# Patient Record
Sex: Male | Born: 1935 | Race: White | Hispanic: No | Marital: Married | Smoking: Former smoker
Health system: Southern US, Community
[De-identification: ages and names within clinical notes are randomized; demographics above are authoritative.]

## PROBLEM LIST (undated history)

## (undated) DIAGNOSIS — I4891 Unspecified atrial fibrillation: Secondary | ICD-10-CM

## (undated) DIAGNOSIS — Z9289 Personal history of other medical treatment: Secondary | ICD-10-CM

## (undated) DIAGNOSIS — I701 Atherosclerosis of renal artery: Secondary | ICD-10-CM

## (undated) DIAGNOSIS — I739 Peripheral vascular disease, unspecified: Secondary | ICD-10-CM

## (undated) DIAGNOSIS — I509 Heart failure, unspecified: Secondary | ICD-10-CM

## (undated) DIAGNOSIS — F329 Major depressive disorder, single episode, unspecified: Secondary | ICD-10-CM

## (undated) DIAGNOSIS — Z8711 Personal history of peptic ulcer disease: Secondary | ICD-10-CM

## (undated) DIAGNOSIS — I350 Nonrheumatic aortic (valve) stenosis: Secondary | ICD-10-CM

## (undated) DIAGNOSIS — R000 Tachycardia, unspecified: Secondary | ICD-10-CM

## (undated) DIAGNOSIS — I679 Cerebrovascular disease, unspecified: Secondary | ICD-10-CM

## (undated) DIAGNOSIS — I219 Acute myocardial infarction, unspecified: Secondary | ICD-10-CM

## (undated) DIAGNOSIS — G4730 Sleep apnea, unspecified: Secondary | ICD-10-CM

## (undated) DIAGNOSIS — I10 Essential (primary) hypertension: Secondary | ICD-10-CM

## (undated) DIAGNOSIS — I214 Non-ST elevation (NSTEMI) myocardial infarction: Secondary | ICD-10-CM

## (undated) DIAGNOSIS — M84376A Stress fracture, unspecified foot, initial encounter for fracture: Secondary | ICD-10-CM

## (undated) DIAGNOSIS — R42 Dizziness and giddiness: Secondary | ICD-10-CM

## (undated) DIAGNOSIS — K219 Gastro-esophageal reflux disease without esophagitis: Secondary | ICD-10-CM

## (undated) DIAGNOSIS — Z7901 Long term (current) use of anticoagulants: Secondary | ICD-10-CM

## (undated) DIAGNOSIS — I482 Chronic atrial fibrillation, unspecified: Secondary | ICD-10-CM

## (undated) DIAGNOSIS — H8110 Benign paroxysmal vertigo, unspecified ear: Secondary | ICD-10-CM

## (undated) DIAGNOSIS — Z9889 Other specified postprocedural states: Secondary | ICD-10-CM

## (undated) DIAGNOSIS — M109 Gout, unspecified: Secondary | ICD-10-CM

## (undated) DIAGNOSIS — J189 Pneumonia, unspecified organism: Secondary | ICD-10-CM

## (undated) DIAGNOSIS — D649 Anemia, unspecified: Secondary | ICD-10-CM

## (undated) DIAGNOSIS — F419 Anxiety disorder, unspecified: Secondary | ICD-10-CM

## (undated) DIAGNOSIS — E119 Type 2 diabetes mellitus without complications: Secondary | ICD-10-CM

## (undated) DIAGNOSIS — I517 Cardiomegaly: Secondary | ICD-10-CM

## (undated) DIAGNOSIS — L0390 Cellulitis, unspecified: Secondary | ICD-10-CM

## (undated) DIAGNOSIS — J449 Chronic obstructive pulmonary disease, unspecified: Secondary | ICD-10-CM

## (undated) DIAGNOSIS — G4733 Obstructive sleep apnea (adult) (pediatric): Secondary | ICD-10-CM

## (undated) DIAGNOSIS — R079 Chest pain, unspecified: Secondary | ICD-10-CM

## (undated) DIAGNOSIS — I6529 Occlusion and stenosis of unspecified carotid artery: Secondary | ICD-10-CM

## (undated) DIAGNOSIS — E785 Hyperlipidemia, unspecified: Secondary | ICD-10-CM

## (undated) DIAGNOSIS — I200 Unstable angina: Secondary | ICD-10-CM

## (undated) DIAGNOSIS — F32A Depression, unspecified: Secondary | ICD-10-CM

## (undated) DIAGNOSIS — I2510 Atherosclerotic heart disease of native coronary artery without angina pectoris: Secondary | ICD-10-CM

## (undated) DIAGNOSIS — L8990 Pressure ulcer of unspecified site, unspecified stage: Secondary | ICD-10-CM

## (undated) HISTORY — DX: Nonrheumatic aortic (valve) stenosis: I35.0

## (undated) HISTORY — DX: Anemia, unspecified: D64.9

## (undated) HISTORY — PX: CAROTID ENDARTERECTOMY: SUR193

## (undated) HISTORY — PX: COLON SURGERY: SHX602

## (undated) HISTORY — DX: Personal history of peptic ulcer disease: Z87.11

## (undated) HISTORY — DX: Atherosclerosis of renal artery: I70.1

## (undated) HISTORY — DX: Heart failure, unspecified: I50.9

## (undated) HISTORY — PX: CARDIAC CATHETERIZATION: SHX172

## (undated) HISTORY — PX: RENAL ARTERY STENT: SHX2321

## (undated) HISTORY — DX: Atherosclerotic heart disease of native coronary artery without angina pectoris: I25.10

## (undated) HISTORY — DX: Hyperlipidemia, unspecified: E78.5

## (undated) HISTORY — DX: Essential (primary) hypertension: I10

## (undated) HISTORY — DX: Unspecified atrial fibrillation: I48.91

## (undated) HISTORY — DX: Occlusion and stenosis of unspecified carotid artery: I65.29

## (undated) HISTORY — DX: Peripheral vascular disease, unspecified: I73.9

---
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
## 1992-06-04 HISTORY — PX: CORONARY ARTERY BYPASS GRAFT: SHX141

## 1992-06-04 HISTORY — PX: OTHER SURGICAL HISTORY: SHX169

## 1997-11-16 ENCOUNTER — Ambulatory Visit (HOSPITAL_COMMUNITY): Admission: RE | Admit: 1997-11-16 | Discharge: 1997-11-16 | Payer: Self-pay | Admitting: Cardiology

## 1998-03-03 ENCOUNTER — Ambulatory Visit (HOSPITAL_COMMUNITY): Admission: RE | Admit: 1998-03-03 | Discharge: 1998-03-03 | Payer: Self-pay | Admitting: Cardiology

## 1998-06-09 ENCOUNTER — Ambulatory Visit (HOSPITAL_COMMUNITY): Admission: RE | Admit: 1998-06-09 | Discharge: 1998-06-09 | Payer: Self-pay | Admitting: Cardiology

## 2001-07-14 ENCOUNTER — Ambulatory Visit (HOSPITAL_BASED_OUTPATIENT_CLINIC_OR_DEPARTMENT_OTHER): Admission: RE | Admit: 2001-07-14 | Discharge: 2001-07-14 | Payer: Self-pay | Admitting: Orthopedic Surgery

## 2003-08-03 HISTORY — PX: KNEE SURGERY: SHX244

## 2003-08-30 ENCOUNTER — Ambulatory Visit (HOSPITAL_BASED_OUTPATIENT_CLINIC_OR_DEPARTMENT_OTHER): Admission: RE | Admit: 2003-08-30 | Discharge: 2003-08-30 | Payer: Self-pay | Admitting: Orthopedic Surgery

## 2004-01-28 ENCOUNTER — Ambulatory Visit (HOSPITAL_COMMUNITY): Admission: RE | Admit: 2004-01-28 | Discharge: 2004-01-28 | Payer: Self-pay | Admitting: Cardiology

## 2004-04-05 ENCOUNTER — Ambulatory Visit: Payer: Self-pay | Admitting: *Deleted

## 2004-05-03 ENCOUNTER — Ambulatory Visit: Payer: Self-pay | Admitting: Cardiology

## 2004-05-31 ENCOUNTER — Ambulatory Visit: Payer: Self-pay | Admitting: Internal Medicine

## 2004-06-28 ENCOUNTER — Ambulatory Visit: Payer: Self-pay | Admitting: Cardiology

## 2004-07-26 ENCOUNTER — Ambulatory Visit: Payer: Self-pay | Admitting: Cardiology

## 2004-07-26 ENCOUNTER — Ambulatory Visit: Payer: Self-pay

## 2004-07-28 ENCOUNTER — Ambulatory Visit: Payer: Self-pay | Admitting: Cardiology

## 2004-07-31 ENCOUNTER — Ambulatory Visit: Payer: Self-pay | Admitting: Cardiology

## 2004-08-23 ENCOUNTER — Ambulatory Visit: Payer: Self-pay | Admitting: Cardiovascular Disease

## 2004-09-20 ENCOUNTER — Ambulatory Visit: Payer: Self-pay | Admitting: Cardiology

## 2004-10-11 ENCOUNTER — Ambulatory Visit: Payer: Self-pay | Admitting: Cardiology

## 2004-11-02 ENCOUNTER — Ambulatory Visit: Payer: Self-pay | Admitting: Internal Medicine

## 2004-12-04 ENCOUNTER — Ambulatory Visit: Payer: Self-pay | Admitting: Cardiology

## 2005-01-01 ENCOUNTER — Ambulatory Visit: Payer: Self-pay | Admitting: Cardiology

## 2005-01-29 ENCOUNTER — Ambulatory Visit: Payer: Self-pay

## 2005-01-29 ENCOUNTER — Ambulatory Visit: Payer: Self-pay | Admitting: Cardiology

## 2005-02-26 ENCOUNTER — Ambulatory Visit: Payer: Self-pay | Admitting: Internal Medicine

## 2005-03-07 ENCOUNTER — Ambulatory Visit: Payer: Self-pay | Admitting: Cardiology

## 2005-03-20 ENCOUNTER — Ambulatory Visit: Payer: Self-pay

## 2005-03-20 ENCOUNTER — Ambulatory Visit: Payer: Self-pay | Admitting: Cardiology

## 2005-03-23 ENCOUNTER — Ambulatory Visit: Payer: Self-pay | Admitting: Cardiology

## 2005-04-18 ENCOUNTER — Ambulatory Visit: Payer: Self-pay | Admitting: Cardiology

## 2005-04-24 ENCOUNTER — Ambulatory Visit: Payer: Self-pay | Admitting: Cardiology

## 2005-05-03 ENCOUNTER — Ambulatory Visit: Payer: Self-pay | Admitting: Cardiology

## 2005-05-09 ENCOUNTER — Ambulatory Visit: Payer: Self-pay | Admitting: Cardiology

## 2005-05-09 ENCOUNTER — Ambulatory Visit (HOSPITAL_COMMUNITY): Admission: RE | Admit: 2005-05-09 | Discharge: 2005-05-10 | Payer: Self-pay | Admitting: Cardiology

## 2005-05-15 ENCOUNTER — Ambulatory Visit: Payer: Self-pay | Admitting: Cardiology

## 2005-05-23 ENCOUNTER — Ambulatory Visit: Payer: Self-pay | Admitting: Internal Medicine

## 2005-06-01 ENCOUNTER — Ambulatory Visit: Payer: Self-pay | Admitting: Cardiology

## 2005-06-15 ENCOUNTER — Ambulatory Visit: Payer: Self-pay | Admitting: Internal Medicine

## 2005-06-20 ENCOUNTER — Ambulatory Visit: Payer: Self-pay | Admitting: Cardiology

## 2005-07-13 ENCOUNTER — Ambulatory Visit: Payer: Self-pay | Admitting: Cardiovascular Disease

## 2005-07-31 ENCOUNTER — Ambulatory Visit: Payer: Self-pay

## 2005-07-31 ENCOUNTER — Encounter: Payer: Self-pay | Admitting: Cardiovascular Disease

## 2005-08-10 ENCOUNTER — Ambulatory Visit: Payer: Self-pay | Admitting: Cardiology

## 2005-09-10 ENCOUNTER — Ambulatory Visit: Payer: Self-pay | Admitting: Internal Medicine

## 2005-10-08 ENCOUNTER — Ambulatory Visit: Payer: Self-pay | Admitting: Cardiology

## 2005-10-22 ENCOUNTER — Ambulatory Visit: Payer: Self-pay | Admitting: Cardiology

## 2005-11-16 ENCOUNTER — Ambulatory Visit: Payer: Self-pay | Admitting: Cardiology

## 2005-12-14 ENCOUNTER — Ambulatory Visit: Payer: Self-pay | Admitting: Cardiology

## 2005-12-19 ENCOUNTER — Ambulatory Visit: Payer: Self-pay

## 2006-01-11 ENCOUNTER — Ambulatory Visit: Payer: Self-pay | Admitting: Cardiology

## 2006-01-17 ENCOUNTER — Ambulatory Visit (HOSPITAL_COMMUNITY): Admission: RE | Admit: 2006-01-17 | Discharge: 2006-01-17 | Payer: Self-pay | Admitting: Cardiology

## 2006-01-17 ENCOUNTER — Ambulatory Visit: Payer: Self-pay | Admitting: Cardiology

## 2006-01-23 ENCOUNTER — Ambulatory Visit: Payer: Self-pay | Admitting: Cardiology

## 2006-02-06 ENCOUNTER — Ambulatory Visit: Payer: Self-pay | Admitting: Cardiology

## 2006-02-06 ENCOUNTER — Ambulatory Visit: Payer: Self-pay

## 2006-02-14 ENCOUNTER — Ambulatory Visit: Payer: Self-pay | Admitting: Cardiology

## 2006-03-06 ENCOUNTER — Ambulatory Visit: Payer: Self-pay | Admitting: Cardiology

## 2006-03-20 ENCOUNTER — Ambulatory Visit: Payer: Self-pay | Admitting: Cardiology

## 2006-04-17 ENCOUNTER — Ambulatory Visit: Payer: Self-pay | Admitting: Cardiology

## 2006-05-08 ENCOUNTER — Ambulatory Visit: Payer: Self-pay

## 2006-05-15 ENCOUNTER — Ambulatory Visit: Payer: Self-pay | Admitting: Internal Medicine

## 2006-06-12 ENCOUNTER — Ambulatory Visit: Payer: Self-pay | Admitting: Internal Medicine

## 2006-07-10 ENCOUNTER — Ambulatory Visit: Payer: Self-pay | Admitting: Cardiology

## 2006-07-10 LAB — CONVERTED CEMR LAB
ALT: 39 units/L (ref 0–40)
AST: 32 units/L (ref 0–37)
Albumin: 3.9 g/dL (ref 3.5–5.2)
Alkaline Phosphatase: 82 units/L (ref 39–117)
BUN: 12 mg/dL (ref 6–23)
Bilirubin, Direct: 0.1 mg/dL (ref 0.0–0.3)
CO2: 33 meq/L — ABNORMAL HIGH (ref 19–32)
Calcium: 9.3 mg/dL (ref 8.4–10.5)
Chloride: 106 meq/L (ref 96–112)
Cholesterol: 141 mg/dL (ref 0–200)
Creatinine, Ser: 1.1 mg/dL (ref 0.4–1.5)
GFR calc Af Amer: 85 mL/min
GFR calc non Af Amer: 70 mL/min
Glucose, Bld: 95 mg/dL (ref 70–99)
HDL: 30.3 mg/dL — ABNORMAL LOW (ref >39.0)
LDL Cholesterol: 75 mg/dL (ref 0–99)
Potassium: 4.6 meq/L (ref 3.5–5.1)
Sodium: 142 meq/L (ref 135–145)
Total Bilirubin: 0.6 mg/dL (ref 0.3–1.2)
Total CHOL/HDL Ratio: 4.7
Total Protein: 6.6 g/dL (ref 6.0–8.3)
Triglycerides: 177 mg/dL — ABNORMAL HIGH (ref 0–149)
VLDL: 35 mg/dL (ref 0–40)

## 2006-08-08 ENCOUNTER — Ambulatory Visit: Payer: Self-pay

## 2006-08-08 ENCOUNTER — Ambulatory Visit: Payer: Self-pay | Admitting: Cardiology

## 2006-09-05 ENCOUNTER — Ambulatory Visit: Payer: Self-pay | Admitting: Cardiology

## 2006-09-09 ENCOUNTER — Ambulatory Visit (HOSPITAL_COMMUNITY): Admission: RE | Admit: 2006-09-09 | Discharge: 2006-09-09 | Payer: Self-pay | Admitting: Orthopedic Surgery

## 2006-10-03 ENCOUNTER — Ambulatory Visit: Payer: Self-pay | Admitting: Cardiovascular Disease

## 2006-10-31 ENCOUNTER — Ambulatory Visit: Payer: Self-pay | Admitting: *Deleted

## 2006-11-29 ENCOUNTER — Ambulatory Visit: Payer: Self-pay | Admitting: Cardiology

## 2006-11-29 ENCOUNTER — Ambulatory Visit: Payer: Self-pay

## 2006-12-20 ENCOUNTER — Ambulatory Visit: Payer: Self-pay | Admitting: Cardiovascular Disease

## 2007-01-07 ENCOUNTER — Ambulatory Visit: Payer: Self-pay | Admitting: Cardiology

## 2007-01-07 LAB — CONVERTED CEMR LAB
BUN: 11 mg/dL (ref 6–23)
CO2: 33 meq/L — ABNORMAL HIGH (ref 19–32)
Calcium: 9.9 mg/dL (ref 8.4–10.5)
Chloride: 107 meq/L (ref 96–112)
Creatinine, Ser: 0.6 mg/dL (ref 0.4–1.5)
GFR calc Af Amer: 170 mL/min
GFR calc non Af Amer: 140 mL/min
Glucose, Bld: 107 mg/dL — ABNORMAL HIGH (ref 70–99)
Potassium: 4.8 meq/L (ref 3.5–5.1)
Sodium: 143 meq/L (ref 135–145)

## 2007-01-17 ENCOUNTER — Ambulatory Visit: Payer: Self-pay | Admitting: Internal Medicine

## 2007-01-17 ENCOUNTER — Encounter: Payer: Self-pay | Admitting: Cardiology

## 2007-01-17 ENCOUNTER — Ambulatory Visit: Payer: Self-pay

## 2007-01-17 LAB — CONVERTED CEMR LAB
ALT: 36 units/L (ref 0–53)
AST: 28 units/L (ref 0–37)
Albumin: 3.9 g/dL (ref 3.5–5.2)
Alkaline Phosphatase: 76 units/L (ref 39–117)
BUN: 12 mg/dL (ref 6–23)
Bilirubin, Direct: 0.1 mg/dL (ref 0.0–0.3)
CO2: 33 meq/L — ABNORMAL HIGH (ref 19–32)
Calcium: 9.1 mg/dL (ref 8.4–10.5)
Chloride: 110 meq/L (ref 96–112)
Cholesterol: 120 mg/dL (ref 0–200)
Creatinine, Ser: 1 mg/dL (ref 0.4–1.5)
GFR calc Af Amer: 94 mL/min
GFR calc non Af Amer: 78 mL/min
Glucose, Bld: 114 mg/dL — ABNORMAL HIGH (ref 70–99)
HDL: 29.3 mg/dL — ABNORMAL LOW (ref >39.0)
LDL Cholesterol: 67 mg/dL (ref 0–99)
Potassium: 4.4 meq/L (ref 3.5–5.1)
Pro B Natriuretic peptide (BNP): 160 pg/mL — ABNORMAL HIGH (ref 0.0–100.0)
Sodium: 149 meq/L — ABNORMAL HIGH (ref 135–145)
Total Bilirubin: 0.8 mg/dL (ref 0.3–1.2)
Total CHOL/HDL Ratio: 4.1
Total Protein: 6.6 g/dL (ref 6.0–8.3)
Triglycerides: 119 mg/dL (ref 0–149)
VLDL: 24 mg/dL (ref 0–40)

## 2007-02-14 ENCOUNTER — Ambulatory Visit: Payer: Self-pay | Admitting: Cardiology

## 2007-02-25 ENCOUNTER — Ambulatory Visit: Payer: Self-pay | Admitting: Cardiology

## 2007-03-17 ENCOUNTER — Ambulatory Visit: Payer: Self-pay | Admitting: Internal Medicine

## 2007-03-17 ENCOUNTER — Ambulatory Visit: Payer: Self-pay

## 2007-03-28 ENCOUNTER — Ambulatory Visit: Payer: Self-pay | Admitting: Cardiovascular Disease

## 2007-04-15 ENCOUNTER — Ambulatory Visit: Payer: Self-pay | Admitting: Cardiovascular Disease

## 2007-04-29 ENCOUNTER — Ambulatory Visit: Payer: Self-pay | Admitting: Internal Medicine

## 2007-05-20 ENCOUNTER — Ambulatory Visit: Payer: Self-pay | Admitting: Internal Medicine

## 2007-06-17 ENCOUNTER — Ambulatory Visit: Payer: Self-pay | Admitting: Cardiology

## 2007-07-10 ENCOUNTER — Ambulatory Visit: Payer: Self-pay | Admitting: Cardiology

## 2007-07-28 ENCOUNTER — Ambulatory Visit: Payer: Self-pay | Admitting: Internal Medicine

## 2007-08-27 ENCOUNTER — Ambulatory Visit: Payer: Self-pay | Admitting: Cardiology

## 2007-08-27 LAB — CONVERTED CEMR LAB
ALT: 36 units/L (ref 0–53)
AST: 27 units/L (ref 0–37)
Albumin: 4.1 g/dL (ref 3.5–5.2)
Alkaline Phosphatase: 64 units/L (ref 39–117)
BUN: 12 mg/dL (ref 6–23)
Bilirubin, Direct: 0.1 mg/dL (ref 0.0–0.3)
CO2: 30 meq/L (ref 19–32)
Calcium: 9.2 mg/dL (ref 8.4–10.5)
Chloride: 105 meq/L (ref 96–112)
Cholesterol: 121 mg/dL (ref 0–200)
Creatinine, Ser: 1 mg/dL (ref 0.4–1.5)
GFR calc Af Amer: 94 mL/min
GFR calc non Af Amer: 78 mL/min
Glucose, Bld: 186 mg/dL — ABNORMAL HIGH (ref 70–99)
HDL: 37.5 mg/dL — ABNORMAL LOW (ref >39.0)
LDL Cholesterol: 68 mg/dL (ref 0–99)
Potassium: 4.1 meq/L (ref 3.5–5.1)
Sodium: 139 meq/L (ref 135–145)
Total Bilirubin: 0.6 mg/dL (ref 0.3–1.2)
Total CHOL/HDL Ratio: 3.2
Total Protein: 6.5 g/dL (ref 6.0–8.3)
Triglycerides: 78 mg/dL (ref 0–149)
VLDL: 16 mg/dL (ref 0–40)

## 2007-09-15 ENCOUNTER — Ambulatory Visit: Payer: Self-pay

## 2007-09-15 ENCOUNTER — Ambulatory Visit: Payer: Self-pay | Admitting: Internal Medicine

## 2007-09-16 ENCOUNTER — Ambulatory Visit: Payer: Self-pay | Admitting: Cardiovascular Disease

## 2007-10-14 ENCOUNTER — Ambulatory Visit: Payer: Self-pay | Admitting: Cardiology

## 2007-11-12 ENCOUNTER — Ambulatory Visit: Payer: Self-pay | Admitting: Cardiology

## 2007-12-10 ENCOUNTER — Ambulatory Visit: Payer: Self-pay | Admitting: Cardiovascular Disease

## 2008-01-07 ENCOUNTER — Ambulatory Visit: Payer: Self-pay | Admitting: Internal Medicine

## 2008-02-04 ENCOUNTER — Ambulatory Visit: Payer: Self-pay | Admitting: Cardiology

## 2008-03-03 ENCOUNTER — Ambulatory Visit: Payer: Self-pay | Admitting: Cardiology

## 2008-03-03 ENCOUNTER — Ambulatory Visit: Payer: Self-pay

## 2008-03-03 ENCOUNTER — Encounter: Payer: Self-pay | Admitting: Cardiology

## 2008-03-31 ENCOUNTER — Ambulatory Visit: Payer: Self-pay | Admitting: Cardiovascular Disease

## 2008-04-19 ENCOUNTER — Ambulatory Visit: Payer: Self-pay

## 2008-04-19 ENCOUNTER — Ambulatory Visit: Payer: Self-pay | Admitting: Cardiology

## 2008-04-19 ENCOUNTER — Ambulatory Visit: Payer: Self-pay | Admitting: Cardiovascular Disease

## 2008-04-19 LAB — CONVERTED CEMR LAB
ALT: 45 units/L (ref 0–53)
AST: 41 units/L — ABNORMAL HIGH (ref 0–37)
Albumin: 3.9 g/dL (ref 3.5–5.2)
Alkaline Phosphatase: 74 units/L (ref 39–117)
BUN: 14 mg/dL (ref 6–23)
Bilirubin, Direct: 0.1 mg/dL (ref 0.0–0.3)
CO2: 32 meq/L (ref 19–32)
Calcium: 8.9 mg/dL (ref 8.4–10.5)
Chloride: 106 meq/L (ref 96–112)
Cholesterol: 121 mg/dL (ref 0–200)
Creatinine, Ser: 1 mg/dL (ref 0.4–1.5)
GFR calc Af Amer: 94 mL/min
GFR calc non Af Amer: 78 mL/min
Glucose, Bld: 124 mg/dL — ABNORMAL HIGH (ref 70–99)
HDL: 26 mg/dL — ABNORMAL LOW (ref >39.0)
LDL Cholesterol: 70 mg/dL (ref 0–99)
Potassium: 3.9 meq/L (ref 3.5–5.1)
Sodium: 143 meq/L (ref 135–145)
Total Bilirubin: 0.6 mg/dL (ref 0.3–1.2)
Total CHOL/HDL Ratio: 4.7
Total Protein: 6.5 g/dL (ref 6.0–8.3)
Triglycerides: 123 mg/dL (ref 0–149)
VLDL: 25 mg/dL (ref 0–40)

## 2008-04-27 ENCOUNTER — Ambulatory Visit: Payer: Self-pay | Admitting: Cardiovascular Disease

## 2008-04-27 ENCOUNTER — Ambulatory Visit: Payer: Self-pay | Admitting: Cardiology

## 2008-05-25 ENCOUNTER — Ambulatory Visit: Payer: Self-pay | Admitting: Cardiovascular Disease

## 2008-06-02 ENCOUNTER — Ambulatory Visit: Payer: Self-pay | Admitting: Internal Medicine

## 2008-06-30 ENCOUNTER — Ambulatory Visit: Payer: Self-pay | Admitting: Cardiovascular Disease

## 2008-07-21 ENCOUNTER — Ambulatory Visit: Payer: Self-pay | Admitting: Cardiology

## 2008-08-11 ENCOUNTER — Ambulatory Visit: Payer: Self-pay

## 2008-09-09 DIAGNOSIS — I679 Cerebrovascular disease, unspecified: Secondary | ICD-10-CM

## 2008-09-09 DIAGNOSIS — E1169 Type 2 diabetes mellitus with other specified complication: Secondary | ICD-10-CM | POA: Insufficient documentation

## 2008-09-09 DIAGNOSIS — Z8711 Personal history of peptic ulcer disease: Secondary | ICD-10-CM

## 2008-09-09 DIAGNOSIS — I739 Peripheral vascular disease, unspecified: Secondary | ICD-10-CM

## 2008-09-09 DIAGNOSIS — E785 Hyperlipidemia, unspecified: Secondary | ICD-10-CM

## 2008-09-09 DIAGNOSIS — I359 Nonrheumatic aortic valve disorder, unspecified: Secondary | ICD-10-CM | POA: Insufficient documentation

## 2008-09-09 DIAGNOSIS — I6529 Occlusion and stenosis of unspecified carotid artery: Secondary | ICD-10-CM

## 2008-09-09 DIAGNOSIS — D649 Anemia, unspecified: Secondary | ICD-10-CM

## 2008-09-09 DIAGNOSIS — I701 Atherosclerosis of renal artery: Secondary | ICD-10-CM

## 2008-09-09 HISTORY — DX: Cerebrovascular disease, unspecified: I67.9

## 2008-09-09 HISTORY — DX: Occlusion and stenosis of unspecified carotid artery: I65.29

## 2008-09-09 HISTORY — DX: Peripheral vascular disease, unspecified: I73.9

## 2008-09-09 HISTORY — DX: Personal history of peptic ulcer disease: Z87.11

## 2008-09-09 HISTORY — DX: Anemia, unspecified: D64.9

## 2008-09-10 ENCOUNTER — Ambulatory Visit: Payer: Self-pay | Admitting: Internal Medicine

## 2008-09-10 ENCOUNTER — Ambulatory Visit: Payer: Self-pay | Admitting: Cardiology

## 2008-09-10 ENCOUNTER — Encounter: Payer: Self-pay | Admitting: Cardiology

## 2008-10-07 ENCOUNTER — Ambulatory Visit: Payer: Self-pay

## 2008-10-07 ENCOUNTER — Ambulatory Visit: Payer: Self-pay | Admitting: Cardiology

## 2008-10-07 ENCOUNTER — Encounter (INDEPENDENT_AMBULATORY_CARE_PROVIDER_SITE_OTHER): Payer: Self-pay | Admitting: *Deleted

## 2008-10-07 ENCOUNTER — Ambulatory Visit: Payer: Self-pay | Admitting: Internal Medicine

## 2008-11-02 ENCOUNTER — Encounter: Payer: Self-pay | Admitting: *Deleted

## 2008-11-04 ENCOUNTER — Ambulatory Visit: Payer: Self-pay | Admitting: Internal Medicine

## 2008-11-04 ENCOUNTER — Encounter (INDEPENDENT_AMBULATORY_CARE_PROVIDER_SITE_OTHER): Payer: Self-pay | Admitting: Cardiology

## 2008-11-04 LAB — CONVERTED CEMR LAB
POC INR: 2.9
Protime: 20.6

## 2008-12-02 ENCOUNTER — Ambulatory Visit: Payer: Self-pay | Admitting: Internal Medicine

## 2008-12-02 LAB — CONVERTED CEMR LAB
POC INR: 3.3
Prothrombin Time: 21.8 s

## 2008-12-08 ENCOUNTER — Encounter: Payer: Self-pay | Admitting: *Deleted

## 2008-12-30 ENCOUNTER — Ambulatory Visit: Payer: Self-pay | Admitting: Internal Medicine

## 2008-12-30 LAB — CONVERTED CEMR LAB
POC INR: 3.9
Prothrombin Time: 24 s

## 2009-01-13 ENCOUNTER — Ambulatory Visit: Payer: Self-pay | Admitting: Internal Medicine

## 2009-01-13 LAB — CONVERTED CEMR LAB: POC INR: 3.4

## 2009-02-03 ENCOUNTER — Ambulatory Visit: Payer: Self-pay | Admitting: Internal Medicine

## 2009-02-03 LAB — CONVERTED CEMR LAB: POC INR: 2.3

## 2009-03-03 ENCOUNTER — Ambulatory Visit: Payer: Self-pay | Admitting: Cardiology

## 2009-03-03 LAB — CONVERTED CEMR LAB: POC INR: 1.8

## 2009-03-10 ENCOUNTER — Ambulatory Visit: Payer: Self-pay | Admitting: Cardiology

## 2009-03-10 DIAGNOSIS — I10 Essential (primary) hypertension: Secondary | ICD-10-CM

## 2009-03-24 ENCOUNTER — Ambulatory Visit: Payer: Self-pay | Admitting: Cardiovascular Disease

## 2009-03-24 LAB — CONVERTED CEMR LAB: POC INR: 2.6

## 2009-04-08 ENCOUNTER — Encounter: Payer: Self-pay | Admitting: Cardiology

## 2009-04-11 ENCOUNTER — Encounter: Payer: Self-pay | Admitting: Cardiology

## 2009-04-12 ENCOUNTER — Ambulatory Visit: Payer: Self-pay

## 2009-04-21 ENCOUNTER — Ambulatory Visit: Payer: Self-pay | Admitting: Cardiology

## 2009-04-21 ENCOUNTER — Encounter (INDEPENDENT_AMBULATORY_CARE_PROVIDER_SITE_OTHER): Payer: Self-pay | Admitting: *Deleted

## 2009-04-21 ENCOUNTER — Ambulatory Visit: Payer: Self-pay | Admitting: Internal Medicine

## 2009-04-21 LAB — CONVERTED CEMR LAB
ALT: 42 units/L (ref 0–53)
AST: 31 units/L (ref 0–37)
Albumin: 3.9 g/dL (ref 3.5–5.2)
Alkaline Phosphatase: 60 units/L (ref 39–117)
BUN: 16 mg/dL (ref 6–23)
Basophils Absolute: 0.1 10*3/uL (ref 0.0–0.1)
Basophils Relative: 0.7 % (ref 0.0–3.0)
Bilirubin, Direct: 0.2 mg/dL (ref 0.0–0.3)
CO2: 34 meq/L — ABNORMAL HIGH (ref 19–32)
Calcium: 9.1 mg/dL (ref 8.4–10.5)
Chloride: 103 meq/L (ref 96–112)
Cholesterol: 133 mg/dL (ref 0–200)
Creatinine, Ser: 1.1 mg/dL (ref 0.4–1.5)
Eosinophils Absolute: 0.2 10*3/uL (ref 0.0–0.7)
Eosinophils Relative: 1.9 % (ref 0.0–5.0)
GFR calc non Af Amer: 69.27 mL/min (ref >60)
Glucose, Bld: 129 mg/dL — ABNORMAL HIGH (ref 70–99)
HCT: 44.8 % (ref 39.0–52.0)
HDL: 40.8 mg/dL (ref >39.00)
Hemoglobin: 14.8 g/dL (ref 13.0–17.0)
LDL Cholesterol: 60 mg/dL (ref 0–99)
Lymphocytes Relative: 38 % (ref 12.0–46.0)
Lymphs Abs: 3.3 10*3/uL (ref 0.7–4.0)
MCHC: 33 g/dL (ref 30.0–36.0)
MCV: 89.3 fL (ref 78.0–100.0)
Monocytes Absolute: 1 10*3/uL (ref 0.1–1.0)
Monocytes Relative: 11.4 % (ref 3.0–12.0)
Neutro Abs: 4.2 10*3/uL (ref 1.4–7.7)
Neutrophils Relative %: 48 % (ref 43.0–77.0)
POC INR: 3.8
Platelets: 202 10*3/uL (ref 150.0–400.0)
Potassium: 4.4 meq/L (ref 3.5–5.1)
RBC: 5.01 M/uL (ref 4.22–5.81)
RDW: 13.7 % (ref 11.5–14.6)
Sodium: 144 meq/L (ref 135–145)
Total Bilirubin: 0.8 mg/dL (ref 0.3–1.2)
Total CHOL/HDL Ratio: 3
Total Protein: 6.4 g/dL (ref 6.0–8.3)
Triglycerides: 160 mg/dL — ABNORMAL HIGH (ref 0.0–149.0)
VLDL: 32 mg/dL (ref 0.0–40.0)
WBC: 8.8 10*3/uL (ref 4.5–10.5)

## 2009-05-12 ENCOUNTER — Ambulatory Visit: Payer: Self-pay | Admitting: Cardiology

## 2009-05-12 LAB — CONVERTED CEMR LAB: POC INR: 2.5

## 2009-06-10 ENCOUNTER — Ambulatory Visit: Payer: Self-pay | Admitting: Cardiology

## 2009-06-10 LAB — CONVERTED CEMR LAB: POC INR: 2.6

## 2009-07-08 ENCOUNTER — Ambulatory Visit: Payer: Self-pay | Admitting: Cardiovascular Disease

## 2009-07-08 LAB — CONVERTED CEMR LAB: POC INR: 2.1

## 2009-08-04 ENCOUNTER — Ambulatory Visit: Payer: Self-pay | Admitting: Cardiology

## 2009-08-04 LAB — CONVERTED CEMR LAB: POC INR: 2

## 2009-09-01 ENCOUNTER — Ambulatory Visit: Payer: Self-pay | Admitting: Internal Medicine

## 2009-09-01 LAB — CONVERTED CEMR LAB: POC INR: 2.1

## 2009-09-08 ENCOUNTER — Ambulatory Visit: Payer: Self-pay | Admitting: Cardiology

## 2009-09-08 ENCOUNTER — Encounter (INDEPENDENT_AMBULATORY_CARE_PROVIDER_SITE_OTHER): Payer: Self-pay | Admitting: *Deleted

## 2009-09-14 ENCOUNTER — Telehealth (INDEPENDENT_AMBULATORY_CARE_PROVIDER_SITE_OTHER): Payer: Self-pay | Admitting: *Deleted

## 2009-09-28 ENCOUNTER — Ambulatory Visit: Payer: Self-pay | Admitting: Cardiovascular Disease

## 2009-09-28 LAB — CONVERTED CEMR LAB: POC INR: 1.1

## 2009-10-05 ENCOUNTER — Telehealth (INDEPENDENT_AMBULATORY_CARE_PROVIDER_SITE_OTHER): Payer: Self-pay | Admitting: *Deleted

## 2009-10-06 ENCOUNTER — Encounter (INDEPENDENT_AMBULATORY_CARE_PROVIDER_SITE_OTHER): Payer: Self-pay | Admitting: *Deleted

## 2009-10-06 ENCOUNTER — Ambulatory Visit: Payer: Self-pay

## 2009-10-06 ENCOUNTER — Ambulatory Visit: Payer: Self-pay | Admitting: Cardiology

## 2009-10-06 ENCOUNTER — Encounter: Payer: Self-pay | Admitting: Cardiology

## 2009-10-06 ENCOUNTER — Ambulatory Visit (HOSPITAL_COMMUNITY): Admission: RE | Admit: 2009-10-06 | Discharge: 2009-10-06 | Payer: Self-pay | Admitting: Cardiology

## 2009-10-06 ENCOUNTER — Encounter (HOSPITAL_COMMUNITY): Admission: RE | Admit: 2009-10-06 | Discharge: 2009-12-02 | Payer: Self-pay | Admitting: Cardiology

## 2009-10-06 ENCOUNTER — Ambulatory Visit: Payer: Self-pay | Admitting: Internal Medicine

## 2010-02-28 ENCOUNTER — Encounter: Payer: Self-pay | Admitting: Cardiology

## 2010-03-03 ENCOUNTER — Encounter: Payer: Self-pay | Admitting: Cardiology

## 2010-03-13 ENCOUNTER — Ambulatory Visit: Payer: Self-pay | Admitting: Cardiology

## 2010-06-16 ENCOUNTER — Encounter: Payer: Self-pay | Admitting: Cardiology

## 2010-07-02 LAB — CONVERTED CEMR LAB
ALT: 38 units/L (ref 0–53)
ALT: 42 units/L (ref 0–53)
AST: 34 units/L (ref 0–37)
AST: 35 units/L (ref 0–37)
Albumin: 4 g/dL (ref 3.5–5.2)
Albumin: 4 g/dL (ref 3.5–5.2)
Alkaline Phosphatase: 64 units/L (ref 39–117)
Alkaline Phosphatase: 77 units/L (ref 39–117)
BUN: 13 mg/dL (ref 6–23)
BUN: 14 mg/dL (ref 6–23)
Basophils Absolute: 0 10*3/uL (ref 0.0–0.1)
Basophils Absolute: 0 10*3/uL (ref 0.0–0.1)
Basophils Absolute: 0 10*3/uL (ref 0.0–0.1)
Basophils Relative: 0.3 % (ref 0.0–3.0)
Basophils Relative: 0.3 % (ref 0.0–3.0)
Basophils Relative: 0.5 % (ref 0.0–3.0)
Bilirubin, Direct: 0.1 mg/dL (ref 0.0–0.3)
Bilirubin, Direct: 0.2 mg/dL (ref 0.0–0.3)
CO2: 31 meq/L (ref 19–32)
CO2: 32 meq/L (ref 19–32)
Calcium: 9 mg/dL (ref 8.4–10.5)
Calcium: 9.1 mg/dL (ref 8.4–10.5)
Chloride: 102 meq/L (ref 96–112)
Chloride: 107 meq/L (ref 96–112)
Cholesterol: 112 mg/dL (ref 0–200)
Cholesterol: 125 mg/dL (ref 0–200)
Creatinine, Ser: 1 mg/dL (ref 0.4–1.5)
Creatinine, Ser: 1.2 mg/dL (ref 0.4–1.5)
Direct LDL: 67.6 mg/dL
Eosinophils Absolute: 0 10*3/uL (ref 0.0–0.7)
Eosinophils Absolute: 0.4 10*3/uL (ref 0.0–0.7)
Eosinophils Absolute: 0.5 10*3/uL (ref 0.0–0.7)
Eosinophils Relative: 0.6 % (ref 0.0–5.0)
Eosinophils Relative: 4.2 % (ref 0.0–5.0)
Eosinophils Relative: 7 % — ABNORMAL HIGH (ref 0.0–5.0)
GFR calc non Af Amer: 62.74 mL/min (ref >60)
GFR calc non Af Amer: 77.24 mL/min (ref >60)
Glucose, Bld: 114 mg/dL — ABNORMAL HIGH (ref 70–99)
Glucose, Bld: 157 mg/dL — ABNORMAL HIGH (ref 70–99)
HCT: 40 % (ref 39.0–52.0)
HCT: 42.5 % (ref 39.0–52.0)
HCT: 43.1 % (ref 39.0–52.0)
HDL: 32.1 mg/dL — ABNORMAL LOW (ref >39.00)
HDL: 40.1 mg/dL (ref >39.00)
Hemoglobin: 13.8 g/dL (ref 13.0–17.0)
Hemoglobin: 14.4 g/dL (ref 13.0–17.0)
Hemoglobin: 14.4 g/dL (ref 13.0–17.0)
LDL Cholesterol: 59 mg/dL (ref 0–99)
Lymphocytes Relative: 22.2 % (ref 12.0–46.0)
Lymphocytes Relative: 26.2 % (ref 12.0–46.0)
Lymphocytes Relative: 27.1 % (ref 12.0–46.0)
Lymphs Abs: 1.8 10*3/uL (ref 0.7–4.0)
Lymphs Abs: 2 10*3/uL (ref 0.7–4.0)
Lymphs Abs: 2.3 10*3/uL (ref 0.7–4.0)
MCHC: 33.4 g/dL (ref 30.0–36.0)
MCHC: 33.9 g/dL (ref 30.0–36.0)
MCHC: 34.5 g/dL (ref 30.0–36.0)
MCV: 86.6 fL (ref 78.0–100.0)
MCV: 88.7 fL (ref 78.0–100.0)
MCV: 89.7 fL (ref 78.0–100.0)
Monocytes Absolute: 0.6 10*3/uL (ref 0.1–1.0)
Monocytes Absolute: 0.7 10*3/uL (ref 0.1–1.0)
Monocytes Absolute: 0.8 10*3/uL (ref 0.1–1.0)
Monocytes Relative: 8.5 % (ref 3.0–12.0)
Monocytes Relative: 9.1 % (ref 3.0–12.0)
Monocytes Relative: 9.4 % (ref 3.0–12.0)
Neutro Abs: 3.8 10*3/uL (ref 1.4–7.7)
Neutro Abs: 5.1 10*3/uL (ref 1.4–7.7)
Neutro Abs: 6.1 10*3/uL (ref 1.4–7.7)
Neutrophils Relative %: 56.9 % (ref 43.0–77.0)
Neutrophils Relative %: 59.9 % (ref 43.0–77.0)
Neutrophils Relative %: 67.8 % (ref 43.0–77.0)
Platelets: 207 10*3/uL (ref 150.0–400.0)
Platelets: 218 10*3/uL (ref 150.0–400.0)
Platelets: 225 10*3/uL (ref 150.0–400.0)
Potassium: 3.8 meq/L (ref 3.5–5.1)
Potassium: 4.2 meq/L (ref 3.5–5.1)
RBC: 4.62 M/uL (ref 4.22–5.81)
RBC: 4.79 M/uL (ref 4.22–5.81)
RBC: 4.8 M/uL (ref 4.22–5.81)
RDW: 14.2 % (ref 11.5–14.6)
RDW: 14.3 % (ref 11.5–14.6)
RDW: 14.6 % (ref 11.5–14.6)
Sodium: 141 meq/L (ref 135–145)
Sodium: 145 meq/L (ref 135–145)
Total Bilirubin: 0.6 mg/dL (ref 0.3–1.2)
Total Bilirubin: 0.7 mg/dL (ref 0.3–1.2)
Total CHOL/HDL Ratio: 3
Total CHOL/HDL Ratio: 3
Total Protein: 6.5 g/dL (ref 6.0–8.3)
Total Protein: 6.7 g/dL (ref 6.0–8.3)
Triglycerides: 106 mg/dL (ref 0.0–149.0)
Triglycerides: 290 mg/dL — ABNORMAL HIGH (ref 0.0–149.0)
VLDL: 21.2 mg/dL (ref 0.0–40.0)
VLDL: 58 mg/dL — ABNORMAL HIGH (ref 0.0–40.0)
WBC: 6.7 10*3/uL (ref 4.5–10.5)
WBC: 8.5 10*3/uL (ref 4.5–10.5)
WBC: 8.9 10*3/uL (ref 4.5–10.5)

## 2010-07-06 NOTE — Assessment & Plan Note (Signed)
Summary: Cardiology Nuclear Study  Nuclear Med Background Indications for Stress Test: Evaluation for Ischemia, Graft Patency   History: CABG, COPD, Echo, Emphysema, Myocardial Infarction  History Comments: '92 MI, '94 MI>CABG x 3; '94 Cutting balloon angio in-stent (L) renal artery; '08 [id number] basilar infero-lateral wall ischemia, EF=60%; '09 Echo:EF=55%, mild AS; chronic afib.  Symptoms: DOE    Nuclear Pre-Procedure Cardiac Risk Factors: Carotid Disease, CVA, Family History - CAD, History of Smoking, Hypertension, Lipids, PVD Caffeine/Decaff Intake: None NPO After: 7:00 PM Lungs: clear IV 0.9% NS with Angio Cath: 22g     IV Site: (R) AC IV Started by: Irven Baltimore RN Chest Size (in) 48     Height (in): 72 Weight (lb): 265 BMI: 36.07  Nuclear Med Study 1 or 2 day study:  1 day     Stress Test Type:  Carlton Adam Reading MD:  Glori Bickers, MD     Referring MD:  Kirk Ruths MD Resting Radionuclide:  Technetium 8m Tetrofosmin     Resting Radionuclide Dose:  11 mCi  Stress Radionuclide:  Technetium 69m Tetrofosmin     Stress Radionuclide Dose:  33 mCi   Stress Protocol   Lexiscan: 0.4 mg   Stress Test Technologist:  Perrin Maltese EMT-P     Nuclear Technologist:  Charlton Amor CNMT  Rest Procedure  Myocardial perfusion imaging was performed at rest 45 minutes following the intravenous administration of Myoview Technetium 59m Tetrofosmin.  Stress Procedure  The patient received IV Lexiscan 0.4 mg over 15-seconds.  Myoview injected at 30-seconds.  There were no significant changes and occ pacs/rare pvc with infusion.  Quantitative spect images were obtained after a 45 minute delay.  QPS Raw Data Images:  Normal; no motion artifact; normal heart/lung ratio. Stress Images:  Decreased uptake in the inferior wall Rest Images:  Decreased uptake in the inferior wall Subtraction (SDS):  Mild thinning of inferior wall consistent with previous infarct versus  diaphragmatic attenaution. No ischemia. Transient Ischemic Dilatation:  .91  (Normal <1.22)  Lung/Heart Ratio:  .33  (Normal <0.45)  Quantitative Gated Spect Images QGS EDV:  106 ml QGS ESV:  44 ml QGS EF:  58 % QGS cine images:  Septal HK  Findings Low risk nuclear study      Overall Impression  Exercise Capacity: [place] study with no exercise. ECG Impression: Baseline: NSR; No significant ST segment change with Lexiscan. Overall Impression: Low risk stress nuclear study. Overall Impression Comments: Mild thinning of inferior wall consistent with previous infarct versus diaphragmatic attenuation. No ischemia.  Appended Document: Cardiology Nuclear Study ok  Appended Document: Cardiology Nuclear Study pt aware of results

## 2010-07-06 NOTE — Medication Information (Signed)
Summary: rov/ewj  Anticoagulant Therapy  Managed by: Porfirio Oar, Pharm D Referring MD: Kirk Ruths MD Supervising MD: Percival Spanish MD, Jeneen Rinks Indication 1: atrial fibrillation?? (ICD-427.31) Lab Used: [facility] Site: [facility] INR POC 2.6 INR RANGE 2 - 3  Dietary changes: no    Health status changes: yes       Details: Had sinus infection  Bleeding/hemorrhagic complications: no    Recent/future hospitalizations: no    Any changes in medication regimen? yes       Details: Took Augmentin 875mg  twice daily for 10 days ( started 05/23/2009) and Azithromycin 250mg  (5 days course).  Recent/future dental: no  Any missed doses?: no       Is patient compliant with meds? yes       Allergies (verified): No Known Drug Allergies  Anticoagulation Management History:      The patient is taking warfarin and comes in today for a routine follow up visit.  Positive risk factors for bleeding include an age of 75 years or older.  The bleeding index is 'intermediate risk'.  Positive CHADS2 values include History of HTN and Age > 41 years old.  The start date was 10/06/1997.  Anticoagulation responsible provider: Percival Spanish MD, Jeneen Rinks.  INR POC: 2.6.  Cuvette Lot#: [id number].  Exp: 07/2010.    Anticoagulation Management Assessment/Plan:      The patient's current anticoagulation dose is Warfarin sodium 5 mg tabs: Use as directed by Anticoagulation Clinic.  The target INR is 2 - 3.  The next INR is due 07/08/2009.  Anticoagulation instructions were given to patient.  Results were reviewed/authorized by Porfirio Oar, Pharm D.  He was notified by Merlyn Albert, Pharm D Candidate.         Prior Anticoagulation Instructions: INR 2.5  Continue on same dosage 1 tablet daily except 1.5 tablets on Mondays.  Recheck in 4 weeks.    Current Anticoagulation Instructions: INR 2.6 at goal ( 2-3) Continue with same dose 5mg  daily except 7.5mg  on Mondays Recheck 4 weeks

## 2010-07-06 NOTE — Assessment & Plan Note (Signed)
Summary: 6 MO F/U  Medications Added BENAZEPRIL HCL 20 MG TABS (BENAZEPRIL HCL) 1/2 tab by mouth once daily        CC:  check up.  History of Present Illness: [name] is a very pleasant gentleman with a history of coronary artery disease, status post coronary artery bypassing graft, permanent atrial fibrillation and peripheral vascular disease (renal artery stenosis and cerebrovascular disease).  His last Myoview was performed in May of 2011.  He had an ejection fraction of 58% with inferior thinning versus prior infarct but no ischemia. An echocardiogram  in May of 2011 showed  LV function  normal.  There was mild aortic stenosis with a mean gradient at 13 mmHg and mild AI. There was biatrial enlargement.  His last carotid Dopplers were performed in May of 2011. This revealed 60-79% bilateral stenosis and followup was recommended in 1 year. Renal Dopplers were performed in May of 2011 and revealed at least 1-59% bilateral stenosis left greater than right. Followup recommended in one year.  I last saw him in April of 2011. Since then he does have dyspnea on exertion relieved with rest. This is unchanged compared to previous. It is not associated with chest pain. There is no orthopnea or PND but has occasional mild pedal edema. He has not had palpitations or syncope and there is no bleeding.  Current Medications (verified): 1)  Benazepril Hcl 20 Mg Tabs (Benazepril Hcl) .... 1/2 Tab By Mouth Once Daily 2)  Vytorin 10-40 Mg Tabs (Ezetimibe-Simvastatin) .[name].. 1 Tab By Mouth Once Daily 3)  Terazosin Hcl 5 Mg Caps (Terazosin Hcl) .[name].. 1 Tab By Mouth Once Daily 4)  Ferrous Sulfate 325 (65 Fe) Mg  Tabs (Ferrous Sulfate) .[name].. 1 Tab By Mouth Once Daily 5)  Albuterol Sulfate (2.5 Mg/60ml) 0.083% Nebu (Albuterol Sulfate) .... As Directed 6)  Allopurinol 300 Mg Tabs (Allopurinol) .[name].. 1 Tab Once Daily 7)  Prednisone 20 Mg Tabs (Prednisone) .... Use As Directed 8)  Aspirin 81 Mg  Tabs (Aspirin) .[name].. 1  Tab By Mouth Once Daily 9)  Aspirin Cream .... As Needed 10)  Lasix 20 Mg Tabs (Furosemide) .[name].. 1 Tab Po  Once Daily 11)  Metoprolol Tartrate 50 Mg Tabs (Metoprolol Tartrate) .... 1/2 Tab Two Times A Day 12)  Citalopram Hydrobromide 40 Mg Tabs (Citalopram Hydrobromide) .... 1/2 Tab Once Daily 13)  Apap 500 Mg Tabs (Acetaminophen) .[name].. 1 Tab Every 6 Hours 14)  Multivitamins   Tabs (Multiple Vitamin) .[name].. 1 Tab Once Daily 15)  Budeprion Sr 150 Mg Xr12h-Tab (Bupropion Hcl) .[name].. 1 Tab Every 12 Hours 16)  Acetaminophen-Codeine #2 300-15 Mg Tabs (Acetaminophen-Codeine) .... Use As Directed.as Needed 17)  Lansoprazole 30 Mg Cpdr (Lansoprazole) .[name].. 1 Tab Once Daily 18)  Symbicort 160-4.5 Mcg/act Aero (Budesonide-Formoterol Fumarate) .... 2 Puffs Two Times A Day 19)  Pradaxa 150 Mg Caps (Dabigatran Etexilate Mesylate) .... One Tablet By Mouth Twice Daily 20)  Nasonex 50 Mcg/act Susp (Mometasone Furoate) .... 2 Sprays Each Nostril Q Daily 21)  Methocarbamol 750 Mg Tabs (Methocarbamol) .... 2 Tabs By Mouth Once Daily 22)  Duoneb 0.5-2.5 (3) Mg/78ml Soln (Ipratropium-Albuterol) .... As Directed  Allergies: 1)  ! * Int. Biaxin  Past History:  Past Medical History: Reviewed history from 09/08/2009 and no changes required. Current Problems:  PEPTIC ULCER DISEASE, HX OF (ICD-V12.71) UNSPECIFIED ANEMIA (ICD-285.9) HYPERLIPIDEMIA (ICD-272.4) AORTIC STENOSIS (ICD-424.1) PERIPHERAL VASCULAR DISEASE (ICD-443.9) ATRIAL FIBRILLATION (ICD-427.31) CAROTID STENOSIS (ICD-433.10) RENAL ARTERY STENOSIS (ICD-440.1) CAD (ICD-414.00)  Past  Surgical History: Reviewed history from 09/09/2008 and no changes required. stenting of the left renal artery as well as a cutting balloon angioplasty for treatment of in-stent restenosis  coronary artery bypass grafting in 1994. left knee surgery in March 2005.  Review of Systems       Pain in Right Lower Extremity and Left Ribs from Recent Fall but no fevers or  chills, productive cough, hemoptysis, dysphasia, odynophagia, melena, hematochezia, dysuria, hematuria, rash, seizure activity, orthopnea, PND, pedal edema, claudication. Remaining systems are negative.   Vital Signs:  Patient profile:   77 year old male Height:      72 inches Weight:      262 pounds BMI:     35.66 Pulse rate:   74 / minute Resp:     12 per minute BP sitting:   122 / 71  (left arm)  Vitals Entered By: Burnett Kanaris (March 13, 2010 9:25 AM)  Physical Exam  General:  Well-developed well-nourished in no acute distress.  Skin is warm and dry.  HEENT is normal.  Neck is supple. No thyromegaly.  Chest is clear to auscultation with normal expansion.  Cardiovascular exam is irregular. 2/6 systolic murmur left sternal border. S2 is not diminished. Abdominal exam nontender or distended. No masses palpated. Extremities show trace edema. neuro grossly intact    EKG  Procedure date:  03/13/2010  Findings:      Atrial fibrillation at a rate of 71. Left axis deviation. Cannot rule out prior inferior infarct. Nonspecific ST changes.  Impression & Recommendations:  Problem # 1:  ESSENTIAL HYPERTENSION, BENIGN (ICD-401.1) Blood pressure controlled on present medications. Continue same. Renal function and potassium monitored by primary care. His updated medication list for this problem includes:    Benazepril Hcl 20 Mg Tabs (Benazepril hcl) .[name]... 1/2 tab by mouth once daily    Terazosin Hcl 5 Mg Caps (Terazosin hcl) .[name]... 1 tab by mouth once daily    Aspirin 81 Mg Tabs (Aspirin) .[name]... 1 tab by mouth once daily    Lasix 20 Mg Tabs (Furosemide) .[name]... 1 tab po  once daily    Metoprolol Tartrate 50 Mg Tabs (Metoprolol tartrate) .[name]... 1/2 tab two times a day  Problem # 2:  HYPERLIPIDEMIA (ICD-272.4) Continue statin. Lipids and liver monitored by primary care. His updated medication list for this problem includes:    Vytorin 10-40 Mg Tabs (Ezetimibe-simvastatin) .[name]... 1  tab by mouth once daily  Problem # 3:  AORTIC STENOSIS (ICD-424.1) Plan followup echocardiogram in May 2013. His updated medication list for this problem includes:    Benazepril Hcl 20 Mg Tabs (Benazepril hcl) .[name]... 1/2 tab by mouth once daily    Lasix 20 Mg Tabs (Furosemide) .[name]... 1 tab po  once daily    Metoprolol Tartrate 50 Mg Tabs (Metoprolol tartrate) .[name]... 1/2 tab two times a day  Problem # 4:  UNSPECIFIED CEREBROVASCULAR DISEASE (ICD-437.9) Continue aspirin and statin. Followup carotid Dopplers May 2012.  Problem # 5:  PERIPHERAL VASCULAR DISEASE (ICD-443.9) Continue aspirin and statin. Followup renal Dopplers May 2012.  Problem # 6:  ATRIAL FIBRILLATION (ICD-427.31) Continue present medications. Rate is controlled. His updated medication list for this problem includes:    Aspirin 81 Mg Tabs (Aspirin) .[name]... 1 tab by mouth once daily    Metoprolol Tartrate 50 Mg Tabs (Metoprolol tartrate) .[name]... 1/2 tab two times a day  Problem # 7:  CAD (ICD-414.00) Continue aspirin, beta blocker and statin. His updated medication list for this  problem includes:    Benazepril Hcl 20 Mg Tabs (Benazepril hcl) .[name]... 1/2 tab by mouth once daily    Aspirin 81 Mg Tabs (Aspirin) .[name]... 1 tab by mouth once daily    Metoprolol Tartrate 50 Mg Tabs (Metoprolol tartrate) .[name]... 1/2 tab two times a day  Problem # 8:  RENAL ARTERY STENOSIS (ICD-440.1) As above.  Patient Instructions: 1)  Your physician recommends that you schedule a follow-up appointment in: 6 MONTHS

## 2010-07-06 NOTE — Letter (Signed)
Summary: [place], [place] [address] [place]   [place], [place] [address]   Phone: [phone]  Fax: [phone]     September 08, 2009 MRN: [id number]   [place], [place]  [address]   Dear Mr. Mell,  We have reviewed your cholesterol results.  They are as follows:     Total Cholesterol:    125 (Desirable: less than 200)       HDL  Cholesterol:     40.10  (Desirable: greater than 40 for men and 50 for women)       LDL Cholesterol:       60  (Desirable: less than 100 for low risk and less than 70 for moderate to high risk)       Triglycerides:       290.0  (Desirable: less than 150)  Our recommendations include:These numbers look good. Continue on the same medicine. Sodium, potassium, blood count, kidney and  Liver function are normal. Take care, Dr. Lyda Kalata.    Call our office at the number listed above if you have any questions.  Lowering your LDL cholesterol is important, but it is only one of a large number of "risk factors" that may indicate that you are at risk for heart disease, stroke or other complications of hardening of the arteries.  Other risk factors include:   A.  Cigarette Smoking* B.  High Blood Pressure* C.  Obesity* D.   Low HDL Cholesterol (see yours above)* E.   Diabetes Mellitus (higher risk if your is uncontrolled) F.  Family history of premature heart disease G.  Previous history of stroke or cardiovascular disease    *These are risk factors YOU HAVE CONTROL OVER.  For more information, visit .  There is now evidence that lowering the TOTAL CHOLESTEROL AND LDL CHOLESTEROL can reduce the risk of heart disease.  The American Heart Association recommends the following guidelines for the treatment of elevated cholesterol:  1.  If there is now current heart disease and less than two risk factors, TOTAL CHOLESTEROL should be less than 200 and LDL CHOLESTEROL should be less than 100. 2.   If there is current heart disease or two or more risk factors, TOTAL CHOLESTEROL should be less than 200 and LDL CHOLESTEROL should be less than 70.  A diet low in cholesterol, saturated fat, and calories is the cornerstone of treatment for elevated cholesterol.  Cessation of smoking and exercise are also important in the management of elevated cholesterol and preventing vascular disease.  Studies have shown that 30 to 60 minutes of physical activity most days can help lower blood pressure, lower cholesterol, and keep your weight at a healthy level.  Drug therapy is used when cholesterol levels do not respond to therapeutic lifestyle changes (smoking cessation, diet, and exercise) and remains unacceptably high.  If medication is started, it is important to have you levels checked periodically to evaluate the need for further treatment options.  Thank you,    [facility]

## 2010-07-06 NOTE — Medication Information (Signed)
Summary: rov/ez  Anticoagulant Therapy  Managed by: Tula Nakayama, RN, BSN Referring MD: Kirk Ruths MD Supervising MD: Rayann Heman MD, Jeneen Rinks Indication 1: atrial fibrillation?? (ICD-427.31) Lab Used: [facility] Site: [facility] INR POC 2.1 INR RANGE 2 - 3  Dietary changes: no    Health status changes: no    Bleeding/hemorrhagic complications: no    Recent/future hospitalizations: no    Any changes in medication regimen? no    Recent/future dental: no  Any missed doses?: no       Is patient compliant with meds? yes       Allergies: No Known Drug Allergies  Anticoagulation Management History:      The patient is taking warfarin and comes in today for a routine follow up visit.  Positive risk factors for bleeding include an age of 75 years or older.  The bleeding index is 'intermediate risk'.  Positive CHADS2 values include History of HTN and Age > 75 years old.  The start date was 10/06/1997.  Anticoagulation responsible provider: Pasha Gadison MD, Jeneen Rinks.  INR POC: 2.1.  Cuvette Lot#: [id number].  Exp: 09/2010.    Anticoagulation Management Assessment/Plan:      The patient's current anticoagulation dose is Warfarin sodium 5 mg tabs: Use as directed by Anticoagulation Clinic.  The target INR is 2 - 3.  The next INR is due 08/05/2009.  Anticoagulation instructions were given to patient.  Results were reviewed/authorized by Tula Nakayama, RN, BSN.  He was notified by Tula Nakayama, RN, BSN.         Prior Anticoagulation Instructions: INR 2.6 at goal ( 2-3) Continue with same dose 5mg  daily except 7.5mg  on Mondays Recheck 4 weeks  Current Anticoagulation Instructions: INR 2.1 Continue 5mg s everyday except 7.5mg s on Mondays. Recheck in 4 weeks.

## 2010-07-06 NOTE — Assessment & Plan Note (Signed)
Summary: 6 MO F/U /CY  Medications Added DUONEB 0.5-2.5 (3) MG/3ML SOLN (IPRATROPIUM-ALBUTEROL) as directed        History of Present Illness: [name] is a very pleasant gentleman with a history of coronary artery disease, status post coronary artery bypassing graft, permanent atrial fibrillation and peripheral vascular disease (renal artery stenosis and cerebrovascular disease).  His last Myoview was performed on January 17, 2007.  He had an ejection fraction of 60% with minimal ischemia in the basal inferolateral wall. An echocardiogram  on March 03, 2008 showed  LV function  normal.  There was mild aortic stenosis with a mean gradient at 10 mmHg.  There was biatrial enlargement.  His last carotid Dopplers were performed in November 2010. This revealed 60-79% bilateral stenosis and followup was recommended in 6 months. Renal Dopplers were performed in May of 2010 and revealed improved velocities in the left renal artery but still elevated at the ostium and mid vessel. Right renal velocities were normal and followup was recommended in one year. I last saw him in October 2010. Since then he does have dyspnea on exertion relieved with rest. This is unchanged compared to previous. It is not associated with chest pain. There is no orthopnea or PND but has occasional mild pedal edema. He has not had palpitations or syncope and there is no bleeding.  Current Medications (verified): 1)  Benazepril Hcl 10 Mg Tabs (Benazepril Hcl) .[name].. 1 Tab By Mouth Once Daily 2)  Vytorin 10-40 Mg Tabs (Ezetimibe-Simvastatin) .[name].. 1 Tab By Mouth Once Daily 3)  Terazosin Hcl 5 Mg Caps (Terazosin Hcl) .[name].. 1 Tab By Mouth Once Daily 4)  Ferrous Sulfate 325 (65 Fe) Mg  Tabs (Ferrous Sulfate) .[name].. 1 Tab By Mouth Once Daily 5)  Albuterol Sulfate (2.5 Mg/23ml) 0.083% Nebu (Albuterol Sulfate) .... As Directed 6)  Allopurinol 300 Mg Tabs (Allopurinol) .[name].. 1 Tab Once Daily 7)  Prednisone 20 Mg Tabs (Prednisone) .... Use As  Directed 8)  Aspirin 81 Mg  Tabs (Aspirin) .[name].. 1 Tab By Mouth Once Daily 9)  Aspirin Cream .... As Needed 10)  Lasix 20 Mg Tabs (Furosemide) .[name].. 1 Tab Po  Once Daily 11)  Metoprolol Tartrate 50 Mg Tabs (Metoprolol Tartrate) .... 1/2 Tab Two Times A Day 12)  Citalopram Hydrobromide 40 Mg Tabs (Citalopram Hydrobromide) .... 1/2 Tab Once Daily 13)  Apap 500 Mg Tabs (Acetaminophen) .[name].. 1 Tab Every 6 Hours 14)  Multivitamins   Tabs (Multiple Vitamin) .[name].. 1 Tab Once Daily 15)  Budeprion Sr 150 Mg Xr12h-Tab (Bupropion Hcl) .[name].. 1 Tab Every 12 Hours 16)  Acetaminophen-Codeine #2 300-15 Mg Tabs (Acetaminophen-Codeine) .... Use As Directed.as Needed 17)  Lansoprazole 30 Mg Cpdr (Lansoprazole) .[name].. 1 Tab Once Daily 18)  Symbicort 160-4.5 Mcg/act Aero (Budesonide-Formoterol Fumarate) .... 2 Puffs Two Times A Day 19)  Warfarin Sodium 5 Mg Tabs (Warfarin Sodium) .... Use As Directed By Anticoagulation Clinic 20)  Nasonex 50 Mcg/act Susp (Mometasone Furoate) .... 2 Sprays Each Nostril Q Daily 21)  Methocarbamol 750 Mg Tabs (Methocarbamol) .... 2 Tabs By Mouth Once Daily 22)  Duoneb 0.5-2.5 (3) Mg/16ml Soln (Ipratropium-Albuterol) .... As Directed  Allergies: No Known Drug Allergies  Past History:  Past Medical History: Current Problems:  PEPTIC ULCER DISEASE, HX OF (ICD-V12.71) UNSPECIFIED ANEMIA (ICD-285.9) HYPERLIPIDEMIA (ICD-272.4) AORTIC STENOSIS (ICD-424.1) PERIPHERAL VASCULAR DISEASE (ICD-443.9) ATRIAL FIBRILLATION (ICD-427.31) CAROTID STENOSIS (ICD-433.10) RENAL ARTERY STENOSIS (ICD-440.1) CAD (ICD-414.00)  Past Surgical History: Reviewed history from 09/09/2008 and no changes required. stenting  of the left renal artery as well as a cutting balloon angioplasty for treatment of in-stent restenosis  coronary artery bypass grafting in 1994. left knee surgery in March 2005.  Social History: Reviewed history from 09/09/2008 and no changes required. Married  Tobacco Use - No.    Alcohol Use - yes  Review of Systems       no fevers or chills, productive cough, hemoptysis, dysphasia, odynophagia, melena, hematochezia, dysuria, hematuria, rash, seizure activity, orthopnea, PND, pedal edema, claudication. Remaining systems are negative.   Vital Signs:  Patient profile:   77 year old male Height:      72 inches Weight:      273 pounds BMI:     37.16 Pulse rate:   78 / minute Resp:     12 per minute BP sitting:   118 / 68  (left arm)  Vitals Entered By: Burnett Kanaris (September 08, 2009 9:45 AM)  Physical Exam  General:  Well-developed well-nourished in no acute distress.  Skin is warm and dry.  HEENT is normal.  Neck is supple. No thyromegaly.  Chest is clear to auscultation with normal expansion.  Cardiovascular exam is irregular; 2/6 systolic murmur left sternal border. S2 is not diminished. Abdominal exam nontender or distended. No masses palpated. Extremities show trace edema. neuro grossly intact    EKG  Procedure date:  09/08/2009  Findings:      Atrial fibrillation at a rate of 78. Cannot rule out prior inferior infarct. Nonspecific ST changes.  Impression & Recommendations:  Problem # 1:  ESSENTIAL HYPERTENSION, BENIGN (ICD-401.1) Blood pressure controlled on present medications. Will continue. Check the meds. His updated medication list for this problem includes:    Benazepril Hcl 10 Mg Tabs (Benazepril hcl) .[name]... 1 tab by mouth once daily    Terazosin Hcl 5 Mg Caps (Terazosin hcl) .[name]... 1 tab by mouth once daily    Aspirin 81 Mg Tabs (Aspirin) .[name]... 1 tab by mouth once daily    Lasix 20 Mg Tabs (Furosemide) .[name]... 1 tab po  once daily    Metoprolol Tartrate 50 Mg Tabs (Metoprolol tartrate) .[name]... 1/2 tab two times a day  Problem # 2:  COUMADIN THERAPY (ICD-V58.61) Goal INR 2-3. Monitored in the Coumadin clinic. Check CBC. Will consider dabigitran in the future.  Problem # 3:  HYPERLIPIDEMIA (P102836.4) Continue statin. Check lipids  and liver. His updated medication list for this problem includes:    Vytorin 10-40 Mg Tabs (Ezetimibe-simvastatin) .[name]... 1 tab by mouth once daily  Orders: TLB-BMP (Basic Metabolic Panel-BMET) (99991111) TLB-CBC Platelet - w/Differential (85025-CBCD) TLB-Hepatic/Liver Function Pnl (80076-HEPATIC) TLB-Lipid Panel (80061-LIPID)  Problem # 4:  AORTIC STENOSIS (ICD-424.1) Repeat echocardiogram. His updated medication list for this problem includes:    Benazepril Hcl 10 Mg Tabs (Benazepril hcl) .[name]... 1 tab by mouth once daily    Lasix 20 Mg Tabs (Furosemide) .[name]... 1 tab po  once daily    Metoprolol Tartrate 50 Mg Tabs (Metoprolol tartrate) .[name]... 1/2 tab two times a day  Orders: TLB-BMP (Basic Metabolic Panel-BMET) (99991111) Echocardiogram (Echo)  Problem # 5:  UNSPECIFIED CEREBROVASCULAR DISEASE (ICD-437.9) Continue aspirin and statin. Followup carotid Dopplers May 2011.  Problem # 6:  ATRIAL FIBRILLATION (ICD-427.31) Continue Coumadin and beta blocker. His updated medication list for this problem includes:    Aspirin 81 Mg Tabs (Aspirin) .[name]... 1 tab by mouth once daily    Metoprolol Tartrate 50 Mg Tabs (Metoprolol tartrate) .[name]... 1/2 tab two times a day  Warfarin Sodium 5 Mg Tabs (Warfarin sodium) ..... Use as directed by anticoagulation clinic  Orders: EKG w/ Interpretation (93000) TLB-BMP (Basic Metabolic Panel-BMET) (99991111) TLB-CBC Platelet - w/Differential (85025-CBCD) TLB-Hepatic/Liver Function Pnl (80076-HEPATIC) TLB-Lipid Panel (80061-LIPID) Nuclear Stress Test (Nuc Stress Test)  Problem # 7:  RENAL ARTERY STENOSIS (ICD-440.1) Continue aspirin and statin. Followup renal Dopplers May 2011.   Orders: TLB-Lipid Panel (80061-LIPID) Carotid Duplex (Carotid Duplex)  Problem # 8:  CAD (ICD-414.00) Continue aspirin, beta blocker, ACE inhibitor and statin. Schedule followup Myoview. His updated medication list for this problem includes:    Benazepril  Hcl 10 Mg Tabs (Benazepril hcl) .[name]... 1 tab by mouth once daily    Aspirin 81 Mg Tabs (Aspirin) .[name]... 1 tab by mouth once daily    Metoprolol Tartrate 50 Mg Tabs (Metoprolol tartrate) .[name]... 1/2 tab two times a day    Warfarin Sodium 5 Mg Tabs (Warfarin sodium) ..... Use as directed by anticoagulation clinic  Orders: TLB-CBC Platelet - w/Differential (85025-CBCD) TLB-Hepatic/Liver Function Pnl (80076-HEPATIC) Nuclear Stress Test (Nuc Stress Test) Echocardiogram (Echo)  Other Orders: Renal Artery Duplex (Renal Artery Duplex)  Patient Instructions: 1)  Your physician recommends that you schedule a follow-up appointment in: 6 months 2)  Your physician has requested that you have a carotid duplex. This test is an ultrasound of the carotid arteries in your neck. It looks at blood flow through these arteries that supply the brain with blood. Allow one hour for this exam. There are no restrictions or special instructions. To be done in May 2011 3)  Your physician has requested that you have an echocardiogram.  Echocardiography is a painless test that uses sound waves to create images of your heart. It provides your doctor with information about the size and shape of your heart and how well your heart's chambers and valves are working.  This procedure takes approximately one hour. There are no restrictions for this procedure. to be done in May 2011 4)  Your physician has requested that you have a renal artery duplex. During this test, an ultrasound is used to evaluate blood flow to the kidneys. Allow one hour for this exam. Do not eat after midnight the day before and avoid carbonated beverages. Take your medications as you usually do. May 2011 5)  Laxiscan Myoview.  to be doe on May 2011 6)  Your physician recommends that you return for a FASTING lipid profile: BMET, CBC, LFT today.

## 2010-07-06 NOTE — Progress Notes (Signed)
Summary: Nuclear pre procedure  Phone Note Outgoing Call Call back at Home Phone [phone]   Call placed by: Valetta Fuller, [place],  Oct 05, 2009 4:21 PM Call placed to: Patient Summary of Call: Reviewed information on Myoview Information Sheet (see scanned document for further details).  Gabriel Cirri spoke with wife.      Nuclear Med Background Indications for Stress Test: Evaluation for Ischemia, Graft Patency   History: CABG, COPD, Echo, Emphysema, Myocardial Infarction  History Comments: '92 MI, '94 MI>CABG x 3; '94 Cutting balloon angio in-stent (L) renal artery; '08 [id number] basilar infero-lateral wall ischemia, EF=60%; '09 Echo:EF=55%, mild AS; chronic afib.  Symptoms: DOE    Nuclear Pre-Procedure Cardiac Risk Factors: Carotid Disease, CVA, Family History - CAD, History of Smoking, Hypertension, Lipids, PVD Height (in): 95  Nuclear Med Study Referring MD:  Kirk Ruths MD

## 2010-07-06 NOTE — Letter (Signed)
Summary: Outpatient Coinsurance Notice  Outpatient Coinsurance Notice   Imported By: Marilynne Drivers 10/07/2009 16:26:45  _____________________________________________________________________  External Attachment:    Type:   Image     Comment:   External Document

## 2010-07-06 NOTE — Medication Information (Signed)
Summary: rov/tm  Anticoagulant Therapy  Managed by: Tula Nakayama, RN, BSN Referring MD: Kirk Ruths MD Supervising MD: Stanford Breed MD, Aaron Edelman Indication 1: atrial fibrillation?? (ICD-427.31) Lab Used: [facility] Site: [facility] INR POC 2.0 INR RANGE 2 - 3  Dietary changes: no    Health status changes: no    Bleeding/hemorrhagic complications: no    Recent/future hospitalizations: no    Any changes in medication regimen? no    Recent/future dental: no  Any missed doses?: no       Is patient compliant with meds? yes       Allergies: No Known Drug Allergies  Anticoagulation Management History:      The patient is taking warfarin and comes in today for a routine follow up visit.  Positive risk factors for bleeding include an age of 75 years or older.  The bleeding index is 'intermediate risk'.  Positive CHADS2 values include History of HTN and Age > 75 years old.  The start date was 10/06/1997.  Anticoagulation responsible provider: Stanford Breed MD, Aaron Edelman.  INR POC: 2.0.  Cuvette Lot#: [id number].  Exp: 10/2010.    Anticoagulation Management Assessment/Plan:      The patient's current anticoagulation dose is Warfarin sodium 5 mg tabs: Use as directed by Anticoagulation Clinic.  The target INR is 2 - 3.  The next INR is due 09/01/2009.  Anticoagulation instructions were given to patient.  Results were reviewed/authorized by Tula Nakayama, RN, BSN.  He was notified by Tula Nakayama, RN, BSN.         Prior Anticoagulation Instructions: INR 2.1 Continue 5mg s everyday except 7.5mg s on Mondays. Recheck in 4 weeks.   Current Anticoagulation Instructions: INR 2.0 Today 7.5mg s then resume 5mg s daily except 7.5mg s on Mondays. Recheck in 4 weeks.

## 2010-07-06 NOTE — Medication Information (Signed)
Summary: rov coumadin - [facility]  Anticoagulant Therapy  Managed by: Inactive Referring MD: Kirk Ruths MD Supervising MD: Haroldine Laws MD, Quillian Quince Indication 1: atrial fibrillation?? (ICD-427.31) Lab Used: [facility] Site: [facility] INR POC 1.1 INR RANGE 2 - 3  Dietary changes: no    Health status changes: no    Bleeding/hemorrhagic complications: no    Recent/future hospitalizations: no    Any changes in medication regimen? no    Recent/future dental: no  Any missed doses?: yes     Details: Off coumadin x 3 days, starting Pradaxa.    Is patient compliant with meds? yes       Allergies (verified): No Known Drug Allergies  Anticoagulation Management History:      The patient is taking warfarin and comes in today for a routine follow up visit.  Positive risk factors for bleeding include an age of 75 years or older.  The bleeding index is 'intermediate risk'.  Positive CHADS2 values include History of HTN and Age > 78 years old.  The start date was 10/06/1997.  Anticoagulation responsible provider: Andi Mahaffy MD, Quillian Quince.  INR POC: 1.1.  Cuvette Lot#: [id number].  Exp: 11/2010.    Anticoagulation Management Assessment/Plan:      The target INR is 2 - 3.  The next INR is due 09/29/2009.  Anticoagulation instructions were given to patient.  Results were reviewed/authorized by Inactive.  He was notified by Freddrick March RN.         Prior Anticoagulation Instructions: INR 2.1  Coumadin 1 and 1/2 tab = 7.5mg  on Mon 1 tab = 5mg  all other days  Current Anticoagulation Instructions: INR 1.1  Start on Pradaxa today, 10 day follow-up CBC scheduled.

## 2010-07-06 NOTE — Medication Information (Signed)
Summary: rov/tm  Anticoagulant Therapy  Managed by: Bonnita Nasuti, PharmD, BCPS, CPP Referring MD: Kirk Ruths MD Supervising MD: Lovena Le MD, Carleene Overlie Indication 1: atrial fibrillation?? (ICD-427.31) Lab Used: [facility] Site: [facility] INR POC 2.1 INR RANGE 2 - 3  Dietary changes: no    Health status changes: no    Bleeding/hemorrhagic complications: no    Recent/future hospitalizations: no    Any changes in medication regimen? no    Recent/future dental: no  Any missed doses?: no       Is patient compliant with meds? yes       Current Medications (verified): 1)  Benazepril Hcl 10 Mg Tabs (Benazepril Hcl) .[name].. 1 Tab By Mouth Once Daily 2)  Vytorin 10-40 Mg Tabs (Ezetimibe-Simvastatin) .[name].. 1 Tab By Mouth Once Daily 3)  Terazosin Hcl 5 Mg Caps (Terazosin Hcl) .[name].. 1 Tab By Mouth Once Daily 4)  Ferrous Sulfate 325 (65 Fe) Mg  Tabs (Ferrous Sulfate) .[name].. 1 Tab By Mouth Once Daily 5)  Albuterol Sulfate (2.5 Mg/61ml) 0.083% Nebu (Albuterol Sulfate) .... As Directed 6)  Allopurinol 300 Mg Tabs (Allopurinol) .[name].. 1 Tab Once Daily 7)  Prednisone 20 Mg Tabs (Prednisone) .... Use As Directed 8)  Aspirin 81 Mg  Tabs (Aspirin) .[name].. 1 Tab By Mouth Once Daily 9)  Aspirin Cream .... As Needed 10)  Lasix 20 Mg Tabs (Furosemide) .[name].. 1 Tab Po  Once Daily 11)  Metoprolol Tartrate 50 Mg Tabs (Metoprolol Tartrate) .... 1/2 Tab Two Times A Day 12)  Citalopram Hydrobromide 40 Mg Tabs (Citalopram Hydrobromide) .... 1/2 Tab Once Daily 13)  Apap 500 Mg Tabs (Acetaminophen) .[name].. 1 Tab Every 6 Hours 14)  Multivitamins   Tabs (Multiple Vitamin) .[name].. 1 Tab Once Daily 15)  Budeprion Sr 150 Mg Xr12h-Tab (Bupropion Hcl) .[name].. 1 Tab Every 12 Hours 16)  Acetaminophen-Codeine #2 300-15 Mg Tabs (Acetaminophen-Codeine) .... Use As Directed.as Needed 17)  Lansoprazole 30 Mg Cpdr (Lansoprazole) .[name].. 1 Tab Once Daily 18)  Symbicort 160-4.5 Mcg/act Aero (Budesonide-Formoterol Fumarate) .... 2 Puffs Two Times  A Day 19)  Warfarin Sodium 5 Mg Tabs (Warfarin Sodium) .... Use As Directed By Anticoagulation Clinic 20)  Nasonex 50 Mcg/act Susp (Mometasone Furoate) .... 2 Sprays Each Nostril Q Daily 21)  Methocarbamol 750 Mg Tabs (Methocarbamol) .... 2 Tabs By Mouth Once Daily  Allergies (verified): No Known Drug Allergies  Anticoagulation Management History:      The patient is taking warfarin and comes in today for a routine follow up visit.  Positive risk factors for bleeding include an age of 75 years or older.  The bleeding index is 'intermediate risk'.  Positive CHADS2 values include History of HTN and Age > 1 years old.  The start date was 10/06/1997.  Anticoagulation responsible provider: Lovena Le MD, Carleene Overlie.  INR POC: 2.1.  Cuvette Lot#: [id number].  Exp: 10/2010.    Anticoagulation Management Assessment/Plan:      The patient's current anticoagulation dose is Warfarin sodium 5 mg tabs: Use as directed by Anticoagulation Clinic.  The target INR is 2 - 3.  The next INR is due 09/29/2009.  Anticoagulation instructions were given to patient.  Results were reviewed/authorized by Bonnita Nasuti, PharmD, BCPS, CPP.         Prior Anticoagulation Instructions: INR 2.0 Today 7.5mg s then resume 5mg s daily except 7.5mg s on Mondays. Recheck in 4 weeks.   Current Anticoagulation Instructions: INR 2.1  Coumadin 1 and 1/2 tab = 7.5mg  on Mon 1 tab = 5mg  all  other days

## 2010-07-06 NOTE — Progress Notes (Signed)
Medications Added BENAZEPRIL HCL 10 MG TABS (BENAZEPRIL HCL) 1 tab by mouth once daily VYTORIN 10-40 MG TABS (EZETIMIBE-SIMVASTATIN) 1 tab by mouth once daily * COUMADIN as directed * COUMADIN as directed * PRNATAL VITAMINS 1 tab by mouth once daily * PRNATAL VITAMINS 1 tab by mouth once daily TERAZOSIN HCL 5 MG CAPS (TERAZOSIN HCL) 1 tab by mouth once daily * TYLENOL as needed * TYLENOL as needed ROBAXIN-750 750 MG TABS (METHOCARBAMOL) 1 tab once daily * METHOCARBAMOL once daily ROBAXIN-750 750 MG TABS (METHOCARBAMOL) 1 tab once daily FERROUS SULFATE 325 (65 FE) MG  TABS (FERROUS SULFATE) 1 tab by mouth once daily * IRON 1 tab by mouth q daily * IRON 1 tab by mouth ad * ALBUTEROL as needed ALBUTEROL SULFATE (2.5 MG/3ML) 0.083% NEBU (ALBUTEROL SULFATE) as directed * ALLOPURINOL 1 tab by mouth once daily ALLOPURINOL 300 MG TABS (ALLOPURINOL) 1 tab once daily PREDNISONE 20 MG TABS (PREDNISONE) use as directed * PREDSIONE 1 tab by mouth once daily ASPIRIN 81 MG  TABS (ASPIRIN) 1 tab by mouth once daily * PEPCID 30MG  1 tab by mouth once daily * PEPCID 30MG  1 tab by mouth once daily * ASPIRIN CREAM as needed LASIX 20 MG TABS (FUROSEMIDE) 1 tab po  once daily METOPROLOL SUCCINATE 25 MG XR24H-TAB (METOPROLOL SUCCINATE) 1 tab by mouth once daily METOPROLOL TARTRATE 50 MG TABS (METOPROLOL TARTRATE) 1/2 tab two times a day OMNICEF 300 MG CAPS (CEFDINIR) 1 tab two times a day OMNICEF 300 MG CAPS (CEFDINIR) 1 tab two times a day CITALOPRAM HYDROBROMIDE 40 MG TABS (CITALOPRAM HYDROBROMIDE) 1/2 tab once daily APAP 500 MG TABS (ACETAMINOPHEN) 1 tab every 6 hours MULTIVITAMINS   TABS (MULTIPLE VITAMIN) 1 tab once daily BUDEPRION SR 150 MG XR12H-TAB (BUPROPION HCL) 1 tab every 12 hours ACETAMINOPHEN-CODEINE #2 300-15 MG TABS (ACETAMINOPHEN-CODEINE) use as directed.as needed LANSOPRAZOLE 30 MG CPDR (LANSOPRAZOLE) 1 tab once daily SYMBICORT 160-4.5 MCG/ACT AERO (BUDESONIDE-FORMOTEROL FUMARATE) 2  puffs two times a day PRADAXA 150 MG CAPS (DABIGATRAN ETEXILATE MESYLATE) one tablet by mouth twice daily WARFARIN SODIUM 5 MG TABS (WARFARIN SODIUM) Use as directed by Anticoagulation Clinic COUMADIN 5 MG TABS (WARFARIN SODIUM) Sunday - 1 tab, Monday - 1.5 tabs, Tuesday - 1 tab, Wednesday - 1.5 tabs, Thursday - 1 tab, Friday - 1.5 tabs, Saturday - 1 tab NASONEX 50 MCG/ACT SUSP (MOMETASONE FUROATE) 2 sprays each nostril q daily METHOCARBAMOL 750 MG TABS (METHOCARBAMOL) 2 tabs by mouth once daily DUONEB 0.5-2.5 (3) MG/3ML SOLN (IPRATROPIUM-ALBUTEROL) as directed       Phone Note Call from Patient   Caller: Spouse Summary of Call: spoke with pt spouse, pt is interested in starting pradaxa. discussed with dr Stanford Breed. pt INR will need to be less than 2.0 before starting the pradaxa. the pt has an INR check on the 27th of this month. he will stop his coumadin 3 days prior to that coumadin appt and as long as his INR is below 2 he can start the pradaxa 150MG  two times a day. he will have a cbc on 10/06/09. Fredia Beets, RN  September 14, 2009 10:33 AM   Follow-up for Phone Call             New/Updated Medications: PRADAXA 150 MG CAPS (DABIGATRAN ETEXILATE MESYLATE) one tablet by mouth twice daily Prescriptions: PRADAXA 150 MG CAPS (DABIGATRAN ETEXILATE MESYLATE) one tablet by mouth twice daily  #60 x 12   Entered by:   Fredia Beets, RN   Authorized  by:   Colin Mulders, MD, [facility]   Signed by:   Fredia Beets, RN on 09/14/2009   Method used:   Electronically to        [facility] (retail)       [address]       [place], [place]  [address]       Ph: [phone]       Fax: [phone]   RxID:   [id number]

## 2010-08-04 LAB — PROTIME-INR

## 2010-09-04 ENCOUNTER — Telehealth: Payer: Self-pay | Admitting: Cardiovascular Disease

## 2010-09-05 ENCOUNTER — Other Ambulatory Visit: Payer: Self-pay | Admitting: *Deleted

## 2010-09-05 MED ORDER — EZETIMIBE-SIMVASTATIN 10-40 MG PO TABS: 1.0000 | ORAL_TABLET | Freq: Every day | ORAL | Status: DC

## 2010-09-05 NOTE — Telephone Encounter (Signed)
Already done

## 2010-09-12 ENCOUNTER — Encounter: Payer: Self-pay | Admitting: Cardiology

## 2010-09-15 ENCOUNTER — Ambulatory Visit: Payer: Self-pay | Admitting: Cardiology

## 2010-09-18 ENCOUNTER — Ambulatory Visit (INDEPENDENT_AMBULATORY_CARE_PROVIDER_SITE_OTHER): Payer: Medicare Other | Admitting: Cardiology

## 2010-09-18 ENCOUNTER — Encounter: Payer: Self-pay | Admitting: Cardiology

## 2010-09-18 DIAGNOSIS — I359 Nonrheumatic aortic valve disorder, unspecified: Secondary | ICD-10-CM

## 2010-09-18 DIAGNOSIS — I701 Atherosclerosis of renal artery: Secondary | ICD-10-CM

## 2010-09-18 DIAGNOSIS — I10 Essential (primary) hypertension: Secondary | ICD-10-CM

## 2010-09-18 DIAGNOSIS — I2510 Atherosclerotic heart disease of native coronary artery without angina pectoris: Secondary | ICD-10-CM

## 2010-09-18 DIAGNOSIS — I4891 Unspecified atrial fibrillation: Secondary | ICD-10-CM

## 2010-09-18 DIAGNOSIS — E785 Hyperlipidemia, unspecified: Secondary | ICD-10-CM

## 2010-09-18 DIAGNOSIS — I6529 Occlusion and stenosis of unspecified carotid artery: Secondary | ICD-10-CM

## 2010-09-18 NOTE — Assessment & Plan Note (Signed)
Continue statin. Lipids and liver monitored by primary care. 

## 2010-09-18 NOTE — Assessment & Plan Note (Signed)
Scheduled followup renal Dopplers May 2012.

## 2010-09-18 NOTE — Patient Instructions (Signed)
Your physician recommends that you schedule a follow-up appointment in: [place] has requested that you have a carotid duplex. This test is an ultrasound of the carotid arteries in your neck. It looks at blood flow through these arteries that supply the brain with blood. Allow one hour for this exam. There are no restrictions or special instructions.   Your physician has requested that you have a renal artery duplex. During this test, an ultrasound is used to evaluate blood flow to the kidneys. Allow one hour for this exam. Do not eat after midnight the day before and avoid carbonated beverages. Take your medications as you usually do.

## 2010-09-18 NOTE — Assessment & Plan Note (Signed)
Continued beta blocker and pradaxa. Renal function and Hgb followed by primary care.

## 2010-09-18 NOTE — Assessment & Plan Note (Signed)
Followup echocardiogram April 2013.

## 2010-09-18 NOTE — Assessment & Plan Note (Signed)
Continue present blood pressure medications. 

## 2010-09-18 NOTE — Progress Notes (Signed)
HPI: [name] is a very pleasant gentleman with a history of coronary artery disease, status post coronary artery bypassing graft, permanent atrial fibrillation and peripheral vascular disease (renal artery stenosis and cerebrovascular disease). His last Myoview was performed in May of 2011. He had an ejection fraction of 58% with inferior thinning versus prior infarct but no ischemia. An echocardiogram in May of 2011 showed LV function normal. There was mild aortic stenosis with a mean gradient at 13 mmHg and mild AI. There was biatrial enlargement. His last carotid Dopplers were performed in May of 2011. This revealed 60-79% bilateral stenosis and followup was recommended in 1 year. Renal Dopplers were performed in May of 2011 and revealed at least 1-59% bilateral stenosis left greater than right. Followup recommended in one year. I last saw him in Oct of 2011. Since then the patient denies any dyspnea on exertion, orthopnea, PND, pedal edema, palpitations, syncope or chest pain.   Current Outpatient Prescriptions  Medication Sig Dispense Refill  . acetaminophen (TYLENOL) 500 MG tablet Take 500 mg by mouth every 6 (six) hours as needed.        [name] acetaminophen-codeine (TYLENOL #2) 300-15 MG per tablet Take 1 tablet by mouth as directed.        [name] albuterol (PROVENTIL) (2.5 MG/3ML) 0.083% nebulizer solution Take 2.5 mg by nebulization as directed.        [name] allopurinol (ZYLOPRIM) 300 MG tablet Take 300 mg by mouth daily.        [name] aspirin 81 MG tablet Take 81 mg by mouth daily.        . budesonide-formoterol (SYMBICORT) 160-4.5 MCG/ACT inhaler Inhale 2 puffs into the lungs 2 (two) times daily.        [name] buPROPion (WELLBUTRIN SR) 150 MG 12 hr tablet Take 150 mg by mouth 2 (two) times daily.        . citalopram (CELEXA) 40 MG tablet Take by mouth daily. 1/2 tablet daily       . dabigatran (PRADAXA) 150 MG CAPS Take 150 mg by mouth every 12 (twelve) hours.        . ergocalciferol (VITAMIN D2) 50000 UNITS  capsule Take 50,000 Units by mouth once a week.        . ezetimibe-simvastatin (VYTORIN) 10-40 MG per tablet Take 1 tablet by mouth at bedtime.  30 tablet  11  . ferrous sulfate 325 (65 FE) MG tablet Take 325 mg by mouth daily with breakfast.        . furosemide (LASIX) 20 MG tablet Take 20 mg by mouth daily.        [name] ipratropium-albuterol (DUONEB) 0.5-2.5 (3) MG/3ML SOLN Take 3 mLs by nebulization as directed.        . lansoprazole (PREVACID) 30 MG capsule Take 30 mg by mouth daily.        . methocarbamol (ROBAXIN) 750 MG tablet Take by mouth. 2 tablets once daily       . metoprolol (LOPRESSOR) 50 MG tablet Take by mouth. 1/2 tablet two times daily       . mometasone (NASONEX) 50 MCG/ACT nasal spray 2 sprays by Nasal route daily.        . Multiple Vitamin (MULTIVITAMIN) tablet Take 1 tablet by mouth daily.        . predniSONE (DELTASONE) 20 MG tablet Take 20 mg by mouth as directed.        . terazosin (HYTRIN) 5 MG capsule Take 5 mg by mouth at bedtime.        [name]  trolamine salicylate (ASPERCREME) 10 % cream Apply topically as needed.        [name] DISCONTD: benazepril (LOTENSIN) 20 MG tablet Take by mouth. 1/2 tablet daily          Past Medical History  Diagnosis Date  . History of peptic ulcer disease   . Anemia   . Hyperlipidemia   . Aortic stenosis    . Peripheral vascular disease   . Atrial fibrillation   . Carotid stenosis   . Renal artery stenosis   . CAD (coronary artery disease)   . Hypertension   . Dyslipidemia   . History of echocardiogram 10/06/2009    07/31/2005 -- 01/17/2007 -- 03/03/2008 & 10/06/2009    Past Surgical History  Procedure Date  . Coronary artery bypass graft 1994  . Renal artery stent     stenting of the left renal artery as well as a cutting balloon angioplasty for treatment of in-stent restenosis coronary artery bypass grafting in 1994.  [name] Knee surgery 08/2003    left knee    History   Social History  . Marital Status: Married    Spouse Name:  N/A    Number of Children: N/A  . Years of Education: N/A   Occupational History  . Not on file.   Social History Main Topics  . Smoking status: Former Research scientist (life sciences)  . Smokeless tobacco: Not on file  . Alcohol Use: Not on file  . Drug Use: No  . Sexually Active: Yes   Other Topics Concern  . Not on file   Social History Narrative   Social History:Married Tobacco Use - No. Alcohol Use - yesFamily History:Mother died at age 67 of coronary disease.  Father died at 75 of coronary disease.  He has 13 siblings, almost all of whom have coronary disease and some with premature onset.    ROS: no fevers or chills, productive cough, hemoptysis, dysphasia, odynophagia, melena, hematochezia, dysuria, hematuria, rash, seizure activity, orthopnea, PND, pedal edema, claudication. Remaining systems are negative.  Physical Exam: Well-developed well-nourished in no acute distress.  Skin is warm and dry.  HEENT is normal.  Neck is supple. No thyromegaly.  Chest is clear to auscultation with normal expansion.  Cardiovascular exam is irregular; 2/6 systolic murmur left sternal border. S2 is not diminished. Abdominal exam nontender or distended. No masses palpated. Extremities show no edema. neuro grossly intact  ECG fibrillation at a rate 67. Prior inferior infarct. Left axis deviation. Nonspecific ST changes.

## 2010-09-18 NOTE — Assessment & Plan Note (Signed)
Scheduled followup carotid Dopplers May 2012. Continue aspirin and statin.

## 2010-09-18 NOTE — Assessment & Plan Note (Signed)
Continue aspirin and statin. 

## 2010-10-04 ENCOUNTER — Encounter: Admitting: Cardiology

## 2010-10-05 ENCOUNTER — Encounter: Admitting: Cardiology

## 2010-10-09 ENCOUNTER — Encounter: Admitting: Cardiology

## 2010-10-11 ENCOUNTER — Encounter: Admitting: Cardiology

## 2010-10-11 ENCOUNTER — Encounter (INDEPENDENT_AMBULATORY_CARE_PROVIDER_SITE_OTHER): Payer: Medicare Other | Admitting: *Deleted

## 2010-10-11 ENCOUNTER — Encounter (INDEPENDENT_AMBULATORY_CARE_PROVIDER_SITE_OTHER): Payer: Medicare Other | Admitting: Cardiology

## 2010-10-11 DIAGNOSIS — I701 Atherosclerosis of renal artery: Secondary | ICD-10-CM

## 2010-10-11 DIAGNOSIS — I6529 Occlusion and stenosis of unspecified carotid artery: Secondary | ICD-10-CM

## 2010-10-12 ENCOUNTER — Telehealth: Payer: Self-pay | Admitting: *Deleted

## 2010-10-12 ENCOUNTER — Encounter: Payer: Self-pay | Admitting: Cardiology

## 2010-10-12 NOTE — Telephone Encounter (Signed)
Spoke with pt wife, called with renal doppler results. She reports discrepancies in the pt med list. Changes made Fredia Beets

## 2010-10-17 NOTE — Assessment & Plan Note (Signed)
[place] OFFICE NOTE   NAME:ELLINGTONZackry, [name]                      MRN:          [id number]  DATE:08/27/2007                            DOB:          [birth year]    HISTORY:  Mr. Zavala is a very pleasant 77 year old gentleman that I  follow for coronary disease, atrial fibrillation and peripheral vascular  disease.  His most recent Myoview was performed on January 17, 2007.  His  ejection fraction was 60%.  There was minimal ischemia in the basilar  inferolateral wall and we have been treating this medically.  His most  recent echocardiogram was on January 17, 2007.  His LV function was  normal.  There was mild aortic stenosis.  There was moderate left atrial  enlargement as well as right atrial enlargement.  He also had renal  Dopplers performed, last on March 17, 2007.  He had 1-59% right renal  artery stenosis and greater than 60% left renal artery stenosis.  He was  seen by Dr. Burt Knack and medical therapy is planned at this point.  He  also has cerebrovascular disease.  Since I last saw him, he has had some  dyspnea on exertion, but has had pneumonia and this is slowly improving.  There is no orthopnea, PND, pedal edema, palpitations, presyncope,  syncope or chest pain.   CURRENT MEDICATIONS:  1. Benazepril 10 mg daily p.o. daily.  2. Coumadin as directed.  3. Vytorin 10/40 daily.  4. Prenatal vitamin.  5. Terazosin 5 mg daily p.o. h.s.  6. Tylenol.  7. Methocarbamol.  8. Wellbutrin.  9. Iron.  10.Albuterol.  11.Allopurinol.  12.Prednisone.  13.Aspirin 81 mg p.o. daily.  14.Pepcid 30 mg p.o. daily.  15.Aspirin cream.  16.Lasix 20 mg p.o. daily.  17.Metoprolol 25 mg p.o. daily.  18.Citalopram.   PHYSICAL EXAMINATION:  VITAL SIGNS:  Blood pressure 151/82 and pulse is  82.  He weighs 270 pounds.  HEENT:  Normal.  NECK:  Supple.  CHEST:  Clear.  CARDIOVASCULAR:  Irregular rhythm.  There is a  2/6 systolic murmur at  the left sternal border.  ABDOMEN:  Shows no tenderness.  EXTREMITIES:  Show no edema.   DIAGNOSTICS:  Electrocardiogram shows atrial fibrillation at a rate of  82.  There is left axis deviation.  There are nonspecific ST changes.   DIAGNOSES:  1. Coronary disease - Mr. Witting has not had increasing chest pain.      We will continue with medical therapy including his aspirin,      statin, beta blocker and ACE inhibitor.  We discussed the      importance of diet and exercise.  2. Mild aortic stenosis - we will most likely repeat his      echocardiogram in 6 months when I see him back.  3. History of renal artery stenosis - we are managing this medically      at present.  We will most likely repeat his renal Dopplers in      October.  I will plan to repeat a BMET today.  4.  Cerebrovascular disease - he will need follow up carotid Dopplers      in April and this has been arranged.  5. Permanent fibrillation - he will continue on his beta blocker for      rate control as well as Coumadin with goal INR 2-3.  6. Hyperlipidemia - he will continue on his Vytorin.  We will check      lipids and liver.  He has not tolerated Niaspan in the past.  7. Coumadin therapy - our goal INR is 2-3 and this is being followed      in our Coumadin Clinic.  8. History of anemia.  This is being managed by his primary care      physician.   FOLLOW UP:  We will see him back in 6 months.   <     Denice Bors. Stanford Breed, MD, [facility]  Electronically Signed    BSC/MedQ  DD: 08/27/2007  DT: 08/27/2007  Job #: [id number]

## 2010-10-17 NOTE — Progress Notes (Signed)
[place]                        PERIPHERAL VASCULAR OFFICE NOTE   NAME:[name]                      MRN:          [id number]  DATE:09/16/2007                            DOB:          March 16, 1932    [name] was seen in followup at the [facility] Peripheral V[facility]  office on September 16, 2007.   [name] is a 77 year old gentleman with carotid stenosis and renal  artery stenosis.  He has undergone left renal artery stenting and also  has had cutting balloon angioplasty to treat in-stent restenosis in that  vessel.  He has had elevated velocities even after cutting balloon  angioplasty, but has not undergone reintervention.   He also has documented moderate carotid stenosis.  This been followed  with serial ultrasounds as well.  He has had no amaurosis or focal  neurologic symptoms.  He denies lightheadedness, syncope, chest pain or  dyspnea.  He has had good blood pressure control on his home  medications.  There is no history of renal insufficiency.   MEDICATIONS:  1. Benazepril 10 mg daily.  2. Coumadin as directed.  3. Vytorin 10/40 mg at bedtime.  4. Prenatal vitamin daily.  5. Terazosin 5 mg at bedtime.  6. Tylenol 500 mg daily.  7. Methocarbamol 750 mg two daily.  8. Wellbutrin SR 150 mg daily.  9. Iron.  10.Albuterol.  11.Allopurinol 300 mg daily.  12.Prednisone as directed.  13.Aspirin 81 mg.  14.Prevacid 30 mg.  15.Lasix 20 mg.  16.Metoprolol 25 mg daily.  17.Citalopram 20 mg daily.   PHYSICAL EXAMINATION:  GENERAL:  He is alert and oriented.  On exam, he  is alert and oriented in no distress.  VITAL SIGNS:  Weight is 272, blood pressure 134/66, heart rate 76,  respiratory rate 16.  HEENT:  Normal.  NECK:  Normal carotid upstrokes with bilateral carotid bruits.  Jugular  venous pressure is normal.  LUNGS:  Clear bilaterally.  HEART:  Irregularly, irregular with a 2/6 systolic murmur along left  sternal  border.  ABDOMEN:  Soft, obese, nontender.  EXTREMITIES:  No clubbing, cyanosis or edema.  Peripheral pulses 2+ and  equal throughout.   ASSESSMENT:  1. Renal artery stenosis, status post initial stenting followed by      cutting balloon angioplasty of in-stent restenosis.  Followup      duplex performed yesterday showed stable, bilateral kidney size      with mild stenosis of the right renal artery and significant      stenosis of the left renal artery.  The peak velocity across the      left renal artery is 381 cm/sec, which is stable from the previous      study.  Renal to aortic ratio is 4.4.  This is also stable.  Since      [name] has no clinical manifestations at this point, I would      continue with close observation.  Follow up renal ultrasound in 6      months.  Continue antihypertensive regimen under the care of Dr.      Stanford Breed.  2. Carotid stenosis.  He has moderate bilateral carotid stenosis.  The      right carotid artery is near the upper limit of the 60-79% range.      Continue with aggressive medical therapy and follow up carotid      ultrasound in 6 months.  Will consider referral for endarterectomy      as his velocities suggest greater than 80% stenosis, even if he      remains asymptomatic.  3. For followup, I will see [name] back in six months after his      carotid and renal ultrasounds are completed.     Juanda Bond. Burt Knack, MD  Electronically Signed    MDC/MedQ  DD: 09/16/2007  DT: 09/16/2007  Job #: [id number]   cc:   Denice Bors. Stanford Breed, MD, [facility]

## 2010-10-17 NOTE — Assessment & Plan Note (Signed)
[place] OFFICE NOTE   NAME:ELLINGTONKaleab, [name]                      MRN:          [id number]  DATE:01/07/2007                            DOB:          12-Nov-1931    [name] is a pleasant gentleman who has a history of coronary  disease, renal artery stenosis and permanent atrial fibrillation.  Since  I last saw him, he is complaining of worsening dyspnea on exertion.  There also appears to be mild orthopnea but there is no PND.  He also  has had increased pedal edema.  Note, he denies any chest pain,  palpitations or syncope.   PRESENT MEDICATIONS:  1. Benazepril 10 mg p.o. daily.  2. Coumadin as directed.  3. Vytorin 10/40 daily.  4. Prenatal vitamins.  5. Terazosin 5 mg p.o. nightly.  6. Methocarbamol.  7. Celexa.  8. Wellbutrin.  9. Iron.  10.Albuterol.  11.Allopurinol.  12.Prednisone.  13.Toprol 12.5 mg p.o. b.i.d.  14.Aspirin 81 mg p.o. daily.  15.Prevacid 30 mg p.o. daily.   PHYSICAL EXAMINATION:  VITAL SIGNS:  Blood pressure 136/71, pulse 58.  He weighs 266 pounds.  HEENT:  Normal.  NECK:  Supple.  CHEST:  Clear.  CARDIOVASCULAR:  Irregular rhythm.  ABDOMEN:  Benign.  EXTREMITIES:  Trace edema bilaterally.   His electrocardiogram shows atrial fibrillation at a rate of 65.  There  is left axis deviation and nonspecific ST changes are noted.   DIAGNOSES:  1. New onset dyspnea/congestive heart failure symptoms.  [name]      appears to be volume overloaded on exam today and he is also      complaining of dyspnea.  I have asked him to begin Lasix 20 mg p.o.      daily.  I think this will most likely improve his symptoms.  We      will check a BMET today.  I will have him return in one week and we      will check a BMET and a BNP as well as lipids and liver.  We will      also plan to repeat his echocardiogram  to quantify his left      ventricular function to exclude progression  of his aortic stenosis      although it does not appear to be significant on exam.  We will      also schedule him to have an adenosine Myoview.  2. History of renal artery stenosis.  Patient did have follow-up renal      Dopplers in June.  There is evidence of some in-stent restenosis of      his left renal artery.  He also has a 1-59% right renal artery      stenosis.  He is scheduled to follow up with Dr. Burt Knack concerning      this in October.  3. Cerebrovascular disease.  His most recent carotid Dopplers were      performed in March and he needs follow-up studies in September.  He      is in the  60-79% range bilaterally.  4. Permanent atrial fibrillation.  He will continue on his present      dose of metoprolol as well as Coumadin with goal INR of 2-3.      Hyperlipidemia.  He will continue on his Vytorin at present dose.      We will check lipids and liver when he returns as described above.  5. Coumadin taper.  6. History of anemia.  7. Coronary artery disease, status post coronary artery bypass      grafting.   He will return to see me in approximately six to eight weeks to make  sure that he has improved.     Denice Bors Stanford Breed, MD, [facility]  Electronically Signed    BSC/MedQ  DD: 01/07/2007  DT: 01/08/2007  Job #: [id number]

## 2010-10-17 NOTE — Assessment & Plan Note (Signed)
[place] OFFICE NOTE   NAME:[name], [name]                      MRN:          [id number]  DATE:03/03/2008                            DOB:          [birth year]    [name] is a pleasant 77 year old gentleman who has a history of  coronary artery disease, permanent atrial fibrillation, peripheral  vascular disease, and cerebrovascular disease.  He also has a history of  mild aortic stenosis.  Note, the patient is status post coronary artery  bypass grafting in 1994.  His most recent Myoview was performed on  January 17, 2007.  At that time, his ejection fraction was 60%.  There is  minimal ischemia in the basal inferolateral wall.  It was felt to be low  risk and we were treating medically.  His last echocardiogram on January 17, 2007, showed normal LV function.  There was mild aortic stenosis.  The left atrium was moderately dilated as was the right atrium.  He also  has cerebrovascular disease and peripheral vascular disease.  Since I  last saw him, he is doing well symptomatically.  He denies any dyspnea,  chest pain, palpitations, or syncope.  There is no pedal edema.   His medications include:  1. Benazepril 10 mg p.o. daily.  2. Coumadin as directed.  3. Vytorin 10/40 daily.  4. Prenatal vitamin.  5. Terazosin 5 mg p.o. nightly.  6. Tylenol.  7. Methocarbamol.  8. Wellbutrin.  9. Albuterol.  10.Allopurinol.  11.Prednisone.  12.Aspirin 81 mg p.o. daily.  13.Prevacid 30 mg p.o. daily.  14.Aspirin cream.  15.Lasix 20 mg p.o. daily.  16.Citalopram.  17.Metoprolol 25 mg p.o. b.i.d.  18.Symbicort.   PHYSICAL EXAMINATION:  Today shows a blood pressure of 138/74 and his  pulse is 78.  He weighs 233 pounds.  His HEENT is normal.  His neck is  supple.  His chest is clear.  His cardiovascular exam reveals irregular  rhythm.  Abdominal exam shows no tenderness.  Extremities show trace  edema.   Electrocardiogram shows atrial fibrillation at a rate of 59.  There is  left axis deviation.  There are nonspecific ST changes.   DIAGNOSES:  1. Coronary artery disease - [name] has had no chest pain or      shortness of breath.  His most recent Myoview was low risk.  We      will continue with medical therapy including his aspirin, statin,      beta-blocker and ACE inhibitor.  2. Mild aortic stenosis - We will plan to repeat his echocardiogram.  3. History of renal artery stenosis - This is being managed medically.      He is due for renal Dopplers this coming Friday.  4. Cerebrovascular disease - He will also need followup carotid      Dopplers on Friday.  He is being followed by Dr. Burt Knack for his      history of peripheral vascular disease.  5. Permanent fibrillation - Continue on his Lopressor as well as      Coumadin with a  goal INR 2-3.  6. Hyperlipidemia - Continue on his statin.  We will check lipids and      liver and adjust as indicated.  Note given his ACE inhibitor use, I      will also check a BMET.  7. Coumadin therapy - This is being managed to Coumadin Clinic and our      goal INR is 2-3.  8. History of anemia - Management per his primary care physician.   We will see him back in 6 months.     Denice Bors Stanford Breed, MD, [facility]  Electronically Signed    BSC/MedQ  DD: 03/03/2008  DT: 03/04/2008  Job #: [id number]   cc:   Nelda Bucks, MD

## 2010-10-17 NOTE — Assessment & Plan Note (Signed)
[place] OFFICE NOTE   NAME:ELLINGTONJosemiguel, Settle                      MRN:          [id number]  DATE:02/25/2007                            DOB:          [birth year]    Mr. Gunkel is a pleasant 77 year old gentleman that I follow for  coronary disease, atrial fibrillation, and peripheral vascular disease.  When I last saw him on January 07, 2007, he was complaining of worsening  dyspnea and appeared to be volume overloaded.  We started Lasix at 20 mg  p.o. daily.  I also scheduled him to have an echocardiogram which is  performed on January 17, 2007.  His left ventricular function was normal  and there was mild aortic stenosis.  There was trivial mitral  regurgitation.  There was biatrial enlargement.  He also had a Myoview  that showed an ejection fraction of 60%.  There was minimal ischemia in  the __________ inferolateral wall.  Since initiating the Lasix, the  patient states his dyspnea on exertion has improved.  There is no  orthopnea or PND and his pedal edema has resolved.  He has not had chest  pain.   MEDICATIONS:  1. Benazepril 10 mg p.o. daily.  2. Coumadin as directed.  3. Vytorin 10/40 daily.  4. Prenatal vitamins.  5. Terazosin 5 mg p.o. daily.  6. Tylenol.  7. Methocarbamol.  8. Celexa.  9. Wellbutrin.  10.Iron.  11.Albuterol.  12.Allopurinol.  13.Prednisone.  14.Metoprolol 25 mg p.o. b.i.d.  15.Aspirin 81 mg p.o. daily.  16.Prevacid 30 mg p.o. daily.  17.Lasix 20 mg p.o. daily.   PHYSICAL EXAMINATION:  Shows a blood pressure of 123/63, and his pulse  is 56.  He weighs 268 pounds.  His HEENT is normal.  His neck is supple.  His chest is clear.  His cardiovascular exam reveals an irregular rhythm.  I noted a 2/6  systolic murmur at the left sternal border.  Abdominal exam was benign.  Extremities showed no edema.   DIAGNOSES:  1. Dyspnea:  The patient's volume status has improved with  the      addition of Lasix.  We will continue at 20 mg p.o. daily.  Note,      his echocardiogram showed preserved left ventricular function and      only mild aortic stenosis, and there was only minimal ischemia on      his Myoview.  2. Coronary artery disease:  He will continue on his aspirin, statin,      beta blocker, and angiotensin-converting enzyme inhibitor.  3. History of renal artery stenosis:  The patient will have followup      renal Dopplers in approximately 1 month, and is scheduled to see      Dr. Burt Knack concerning this as well.  4. Cerebrovascular disease:  He will have followup carotid Dopplers in      October.  5. Permanent atrial fibrillation:  He will continue on his Lopressor      and we will continue with Coumadin with goal INR of 2-3.  6. Hyperlipidemia:  He will  continue on his Vytorin.  His HDL has been      low, but he has not tolerated Niaspan in the past.  7. Coumadin therapy.  8. History of anemia.   I will see him back in 6 months.     Denice Bors Stanford Breed, MD, [facility]  Electronically Signed    BSC/MedQ  DD: 02/25/2007  DT: 02/25/2007  Job #: [id number]

## 2010-10-17 NOTE — Progress Notes (Signed)
[facility] V[facility] OFFICE NOTE   NAME:ELLINGTONReakwon, [name]                      MRN:          [id number]  DATE:04/27/2008                            DOB:          [birth year]    REASON FOR VISIT:  Renal artery stenosis.   HISTORY OF PRESENT ILLNESS:  [name] is a 77 year old gentleman  with coronary artery disease as well as peripheral arterial disease.  He  has been followed for renal artery stenosis as well as carotid stenosis.  He has undergone stenting of the left renal artery as well as a cutting  balloon angioplasty for treatment of in-stent restenosis.  Despite  ultrasound study showing elevated velocities suggestive of severe  recurrent left renal artery restenosis, the patient has been stable with  good control of his blood pressure as well as stable renal function.   He has no complaints at today's evaluation.  He denies chest or  abdominal pain.  He has had no edema or shortness of breath.   MEDICATIONS:  1. Aspirin 81 mg daily.  2. Prevacid 30 mg daily.  3. Lasix 20 mg daily.  4. Citalopram 20 mg daily.  5. Metoprolol tartrate 25 mg b.i.d.  6. Symbicort 2 puffs twice daily.   ALLERGIES:  NKDA.   Continue with medicines.  He has lot more medicines left.  Benazepril 10  mg daily, Coumadin as directed, Vytorin 10/40 mg daily, terazosin 5 mg  at bedtime, Tylenol 500 mg daily, methocarbamol 750 mg two daily,  Wellbutrin SR 150 mg daily, allopurinol 300 mg daily, and prednisone 20  mg daily.   ALLERGIES:  NKDA.   PHYSICAL EXAMINATION:  GENERAL:  The patient is alert and oriented.  He  is an obese male in no acute distress.  VITAL SIGNS:  Blood pressure is 120/64 in the right arm, 120/60 in the  left arm, heart rate 64, respiratory rate 16.  HEENT:  Normal.  NECK:.  JVP normal.  Carotid upstrokes normal with bilateral bruits.  LUNGS:  Clear bilaterally.  HEART:  Irregularly irregular.  ABDOMEN:   Soft and nontender.  EXTREMITIES:  No clubbing, cyanosis, or edema.  SKIN:  Warm and dry without rash.   Most recent vascular studies from November 16 show stable renal artery  findings with 1-59% right renal artery stenosis and greater than 60%  left renal artery stenosis.  Carotid ultrasound also dated April 19, 2008, showed 60-79% bilateral internal carotid artery stenosis with  stable velocities from previous studies.   ASSESSMENT:  1. Renal artery stenosis with in-stent restenosis of the left renal      artery.  The patient has already failed cutting balloon angioplasty      in the past.  He has excellent blood pressure control, then I would      recommend continued medical management.  2. Carotid stenosis.  Stable findings.  Followup ultrasound in 6      months.  Continue secondary risk reduction under the care of Dr.      Stanford Breed.   For followup, I would like to see [name]  back in 1 year.  We will  continue to follow his ultrasound studies twice yearly.     Juanda Bond. Burt Knack, MD  Electronically Signed    MDC/MedQ  DD: 05/02/2008  DT: 05/02/2008  Job #: [id number]   cc:   Denice Bors. Stanford Breed, MD, [facility]  Nelda Bucks, MD

## 2010-10-17 NOTE — Assessment & Plan Note (Signed)
[place] OFFICE NOTE   NAME:ELLINGTONSteel, Vaid                      MRN:          [id number]  DATE:09/10/2008                            DOB:          02-10-1932    [name] is a very pleasant gentleman, who is 77 years old.  He has  history of coronary artery disease, status post coronary artery  bypassing graft, permanent atrial fibrillation and peripheral vascular  disease (renal artery stenosis and cerebrovascular disease).  His last  Myoview was performed on January 17, 2007.  He had an ejection fraction  of 60% with minimal ischemia in the basal inferolateral wall.  When I  last saw him, we scheduled him to have an echocardiogram.  This was  performed on March 03, 2008.  His LV function was normal.  There was  mild aortic stenosis with a mean gradient at 10 mmHg.  There was  biatrial enlargement.  His last Dopplers were performed on April 19, 2008.  He had a 60-79% bilateral internal carotid artery stenosis, right  greater than left.  He also had a greater than 60% left renal artery  stenosis and 1-59% right renal artery stenosis.  Follow up of both of  these was recommended in 6 months.  Since I last saw him, he has had  some congestion.  There is no increased dyspnea, orthopnea, PND, or  pedal edema.  There is no palpitations, syncope, bleeding, or chest  pain.   MEDICATIONS:  1. Benazepril 10 mg p.o. daily.  2. Vytorin 10/40 one p.o. daily.  3. Coumadin.  4. Prenatal vitamin.  5. Terazosin HCl 5 mg p.o. daily.  6. Tylenol.  7. Robaxin.  8. Iron.  9. Albuterol.  10.Allopurinol.  11.Prednisone.  12.Aspirin 81 mg p.o. daily.  13.Pepcid.  14.Lasix 20 mg p.o. daily.  15.Metoprolol 25 mg p.o. b.i.d.  16.Omnicef.  17.Citalopram.  18.Multivitamin.  19.Lansoprazole 30 mg p.o. daily.   PHYSICAL EXAMINATION:  VITAL SIGNS:  Blood pressure of 115/60 and his  pulse is 79.  HEENT:  Normal.  NECK:  Supple.  CHEST:  Clear.  CARDIOVASCULAR:  An irregular rate and rhythm.  ABDOMEN:  No tenderness.  EXTREMITIES:  No edema.   His electrocardiogram shows atrial fibrillation at a rate of 78.  There  is left axis deviation.  There are nonspecific ST changes.   DIAGNOSES:  1. Permanent atrial fibrillation - the patient's heart rate is      adequately controlled.  He will continue on his Lopressor and      Coumadin.  2. Mild aortic stenosis.  3. Coronary artery disease - he will continue his aspirin, statin,      beta-blocker, and ACE inhibitor.  4. Renal artery stenosis - he will need followup renal Dopplers in      May.  5. Cerebrovascular disease - he will need followup carotid Dopplers in      May.  6. Hyperlipidemia - he will continue his Vytorin.  7. Coumadin therapy - this will be managed by [facility].  8. History of  anemia.   We will check blood work in May when he returns for his Dopplers  including a CBC, BMET, lipids, and liver.   We will see him back in 9 months.     Denice Bors Stanford Breed, MD, [facility]  Electronically Signed    BSC/MedQ  DD: 09/10/2008  DT: 09/10/2008  Job #: [id number]

## 2010-10-17 NOTE — Progress Notes (Signed)
[place]                        PERIPHERAL VASCULAR OFFICE NOTE   NAME:[name], [name]                      MRN:          [id number]  DATE:03/28/2007                            DOB:          [birth year]    [name] returns for followup at the Peripheral Vascular Office on  March 28, 2007.   [name] has renal artery stenosis as well as a history of coronary  artery disease and atrial fib.  He has been followed by Dr. Albertine Patricia.  He  has undergone left renal artery stenting as well as cutting balloon  angioplasty for in-stent restenosis in this vessel.  He has had  persistently elevated velocities across the left renal artery since his  cutting-balloon angioplasty and has been monitored closely but has not  undergone repeat intervention.   [name] is stable from a standpoint of normal creatinine and his  blood pressure has actually been low.  He has been diagnosed with  pneumonia and has been managed as an outpatient.  His blood pressure has  been down and his primary care physician has actually put a couple of  his antihypertensive medications on hold to try and maintain a  reasonable blood pressure.   He denies any chest pain, edema, or other complaints at this point.   CURRENT MEDICATIONS:  1. Coumadin.  2. Vytorin 10/40 mg.  3. Terazosin 5 mg.  4. Tylenol.  5. Methocarbamol.  6. Celexa.  7. Wellbutrin SR.  8. Iron.  9. Albuterol.  10.Allopurinol 300 mg daily.  11.Prednisone as directed.  12.Metoprolol 50 mg one half b.i.d. (currently reduced to one half      daily).  13.Aspirin 81 mg daily.  14.Prevacid 30 mg daily.  15.Lasix 20 mg daily.  16.He also takes benazepril but this is on hold at present.   PHYSICAL EXAMINATION:  GENERAL:  He is alert and oriented in no acute  distress.  VITAL SIGNS:  Weight is 261.  Blood pressure is 104/60 in the right arm  and 100/58 in the left arm.  Heart rate is 72.  Respiratory  rate is 16.  HEENT:  Normal.  NECK:  Normal carotid upstrokes without bruits.  Jugular venous pressure  is normal.  LUNGS:  Clear to auscultation bilaterally.  HEART:  Irregularly irregular.  There is a 2/6 systolic murmur along the  left sternal border.  ABDOMEN:  Soft, obese.  No bruits.  EXTREMITIES:  No clubbing, cyanosis, or edema.   Renal duplex ultrasound demonstrated stable bilateral kidney size of  12.9-cm on the right and 12.5 on the left.  The peak velocity across the  left renal ostium is 393-cm/sec and the renal to aortic ratio is 5. On  the right the peak velocity is 200-cm/sec with renal artery ratio of  2.5.  This suggests less than 60% right renal artery stenosis and  greater than 60% left renal artery stenosis.   ASSESSMENT:  1. Renal artery stenosis.  [name] has stable but elevated      velocities across the left renal artery.  He likely has significant  in-stent restenosis.  However, he is maintaining normal creatinine      and even has low blood pressure.  There is really no indication to      intervene at this point in the setting of his stable clinical      status.  Also the treatment of in-stent restenosis is typically      less durable and I would not favor proceeding with intervention      unless he develops some clinical indication.  2. Carotid arterial disease.  He has 60-79% bilateral carotid stenosis      and will follow up in 6 months with repeat carotid ultrasounds.      Secondary risk reduction per Dr. Stanford Breed.   FOLLOWUP:  I would like to see [name] back in 6 months after his  renal ultrasound.     [name] Bond. Burt Knack, MD  Electronically Signed    MDC/MedQ  DD: 03/28/2007  DT: 03/28/2007  Job #: [id number]   cc:   Denice Bors. Stanford Breed, MD, [facility]

## 2010-10-20 NOTE — Op Note (Signed)
[name], [name] NO.:  [id number]   MEDICAL RECORD NO.:  [id number]          PATIENT TYPE:  AMB   LOCATION:  SDS                          FACILITY:  [facility]   PHYSICIAN:  [name], MD  DATE OF BIRTH:  1932-05-17   DATE OF PROCEDURE:  01/17/2006  DATE OF DISCHARGE:                                 OPERATIVE REPORT   PROCEDURE:  Cutting balloon angioplasty of in-stent restenosis of the left  renal artery, Angio-Seal closure of the right common femoral arteriotomy  site.   INDICATIONS:  [name] is a 77 year old gentleman status post stenting  of left renal artery stenosis December 2006.  He now presents with difficult  to control hypertension and severe in-stent restenosis by duplex  ultrasonography with peak systolic velocity of 123456 cm/sec.  He presents for  planned renal angiography and probable revascularization of left renal  artery.   PROCEDURE TECHNIQUE:  Informed consent was obtained.  Under 1% lidocaine  local anesthesia, a 6-French sheath was placed in the right common femoral  artery using the modified Seldinger technique.  Diagnostic angiography with  both renal arteries was performed using 5-French LIMA diagnostic catheter.  This demonstrated normal right renal artery and severe in-stent restenosis  of the left renal artery.   Anticoagulation was initiated with heparin.  ACT was 224 seconds.  An  additional 500 units of heparin was administered.  A 6-French LIMA guide was  advanced over a wire and engaged in the ostium of left renal artery.  Stabilizer wire was advanced across the in-stent restenosis into the  midportion of the renal artery.  I attempted to began with 6.0 x 20-mm  Aviator balloon.  Despite attempts with very slow inflation, he has had  repeated watermelon seeding of the balloon.  We therefore proceeded to  cutting balloon angioplasty using a 4.0 x 15-mm cutting balloon at 10  atmospheres for 2 inflations.  We then  reattempted further angioplasty with  the 6-mm Aviator balloon.  Again, there was watermelon seeding that  precluded effective dilation.  We therefore decided to proceed to cutting  balloon angioplasty with a larger cutting balloon.   To facilitate placement of the larger cutting balloon,  we had to upsize the  sheath and guide to 8-French.  The 8-French LIMA guide was engaged in the  left renal artery.  Stabilizer wire was re-advanced without difficulty.  A 7  x 15-mm cutting balloon was then inflated to 8 atmospheres.  The patient had  substantial back discomfort with this inflation.  We therefore did not  inflate to any higher pressure.  We did do a total of 3 inflations, each at  8 atmospheres.  Final angiography demonstrated no residual stenosis, no  dissection, and normal flow to the renal parenchyma.   The arteriotomy was then closed using an 8-French Angio-Seal device.  Complete hemostasis was obtained.  He was then transferred to the holding  room in stable condition.   COMPLICATIONS:  None.   FINDINGS:  1. Right renal artery:  Angiographically normal.  2. Left renal artery:  Single  vessel.  There is 90% in-stent restenosis.      This was treated with cutting balloon angioplasty to no residual      stenosis.   IMPRESSION/PLAN:  Successful cutting balloon angioplasty with in-stent  restenosis of the left renal artery.  The right renal artery is normal.  We  will discharge him today.  He should check daily blood pressures.  Will  check BMET in 1 week and renal duplex in 1-2 weeks.  Plan close duplex  follow-up.      [name], MD  Electronically Signed     WED/MEDQ  D:  01/17/2006  T:  01/17/2006  Job:  [id number]   cc:   Denice Bors. Stanford Breed, MD,FACC

## 2010-10-20 NOTE — Progress Notes (Signed)
[place]                          PERIPHERAL VASCULAR OFFICE NOTE   NAME:ELLINGTONMeritt, [name]                      MRN:          [id number]  DATE:02/14/2006                            DOB:          02-20-1932    HISTORY OF PRESENT ILLNESS:  [name] is a 77 year old gentleman, now  one month status post cutting balloon angioplasty for in-stent restenosis of  a left renal artery stent.  Postprocedural course has been uncomplicated.  Renal function remains stable, with a creatinine of 1.2.  His wife presented  me with blood pressures measured daily that range from 100/50 to 130/68,  with the majority being less than 0000000 systolic, and no diastolic measured  greater than 80.  Renal duplex examination shows patency of the stent as of  February 06, 2006.  Peak systolic velocity was 0000000 cm/sec.   CURRENT MEDICATIONS:  1. Benazepril 10 mg daily.  2. Coumadin.  3. Vytorin 10/40, 1 at bedtime.  4. Multivitamin.  5. Terazosin 5 mg at bedtime.  6. Tylenol.  7. Methocarbamol 1500 mg/day.  8. Celexa 20 mg per day.  9. Wellbutrin SR 150 mg per day.  10.Iron.  11.Albuterol 2 puffs b.i.d.  12.Allopurinol 300 mg per day.  13.Prednisone 20 mg per day.  14.Metoprolol 12.5 mg twice per day.  15.Aspirin 81 mg per day.  16.Prevacid 30 mg per day.   PHYSICAL EXAMINATION:  GENERAL:  He is generally well appearing, in no  distress.  VITAL SIGNS:  Heart rate 74, blood pressure 118/72 on the left, and 116/74  on the right.  He weighs 274 pounds.  Weight is stable.  HEENT:  Normal.  SKIN:  Normal.  MUSCULOSKELETAL:  Normal.  NECK:  He has no jugular venous distention, thyromegaly, or lymphadenopathy.  RESPIRATORY:  Effort is normal.  Lungs are clear to auscultation.  CARDIOVASCULAR:  He has a nondisplaced point of maximal cardiac impulse.  There is an irregularly irregular rate and rhythm, with a 2/6 systolic  ejection murmur at the left upper sternal border.   No radiation.  ABDOMEN:  Soft, nondistended, nontender.  There is no hepatosplenomegaly.  Bowel sounds are normal.  Abdominal bruit has resolved.  EXTREMITIES:  Warm, without clubbing, cyanosis, edema, or ulceration.  VASCULAR:  Carotid pulses 2+ bilaterally, with a soft bruit on the right.   IMPRESSION/RECOMMENDATIONS:  1. Renal artery stenosis.  Doing nicely after cutting balloon angioplasty      of the left renal artery restenosis.  Check repeat duplex in 3 and 6      months, and the 6 months thereafter.  2. Cerebrovascular disease.  Follow up by Dr. Stanford Breed.  3. Hypertension.  Nicely controlled.  4. Atrial fibrillation.  Rate controlled.  Back on Coumadin and tolerating      it well.  5. Coronary disease.  Per Dr. Stanford Breed.                                   [name], MD   WED/MedQ  DD:  02/14/2006  DT:  02/15/2006  Job #:  [id number]   cc:   Denice Bors. Stanford Breed, MD, [facility]

## 2010-10-20 NOTE — H&P (Signed)
[name], RODI NO.:  [id number]   MEDICAL RECORD NO.:  [id number]          PATIENT TYPE:  OIB   LOCATION:  2899                         FACILITY:  [facility]   PHYSICIAN:  [name], M.D. LHCDATE OF BIRTH:  Jul 21, 1931   DATE OF ADMISSION:  01/28/2004  DATE OF DISCHARGE:  01/28/2004                                HISTORY & PHYSICAL   CHIEF COMPLAINT:  Progressive carotid stenosis by ultrasound, asymptomatic.   HISTORY OF PRESENT ILLNESS:  [name] is a 77 year old gentleman with  coronary artery disease, status post CABG in 1994 with carotid stenosis that  has been followed by serial ultrasound.  He has never had any symptom of  stroke or TIA.  Ultrasound performed January 10, 2004 showed progression of  the right internal carotid stenosis with peak velocities of 267/86,  increased from peak systolic velocity of Q000111Q cm per second in October 2004.  Velocities on the left were in the moderate range and demonstrated moderate  stenosis which was stable.   Based on ultrasound, the longitudinal extent of the right disease was  unclear but there was very turbulent flow on the distal internal carotid  suggestive of extensive length to this stenosis.  He is referred for  diagnostic angiography to clarify the severity of this stenosis and also its  longitudinal extent.   PAST MEDICAL HISTORY:  1.  Hypertension.  2.  Coronary artery disease, status post CABG.  3.  Chronic atrial fibrillation.  4.  Dyslipidemia.  5.  Peptic ulcer disease.  6.  Status post left knee surgery in March 2005.   ALLERGIES:  No known drug allergies.   CURRENT MEDICATIONS:  1.  Monopril 10 mg q.d.  2.  Wellbutrin SR 150 mg q.d.  3.  Coumadin held x 5 days.  4.  Simvastatin 20 mg p.o. q.h.s.  5.  Terazosin 5 mg q.h.s.  6.  Methocarbamol 750 mg b.i.d.  7.  Zoloft 100 mg q.d.  8.  Metoprolol 25 mg b.i.d.  9.  Nexium 40 mg q.d.  10. Aspirin 81 mg q.d.  11. Multivitamin.   SOCIAL HISTORY:  The patient is married and is very active.  Quit smoking in  1994.  Minimal alcohol.   FAMILY HISTORY:  Mother died at age 36 of coronary disease.  Father died at  77 of coronary disease.  He has 13 siblings, almost all of whom have  coronary disease and some with premature onset.   REVIEW OF SYMPTOMS:  Negative in detail.   PHYSICAL EXAMINATION:  GENERAL:  This is an obese but otherwise well-  appearing man in no distress.  VITAL SIGNS:  Heart rate 80, blood pressure 135/84, and oxygen saturation of  99% on room air.  He is afebrile.  NECK:  He has no jugular venous distention and no thyromegaly.  LUNGS:  Clear to auscultation.  HEART:  He has irregularly irregular rhythm with a 2/6 systolic murmur at  the left upper sternal border.  ABDOMEN:  Benign.  EXTREMITIES:  Warm without clubbing, cyanosis, edema, or ulceration.  PULSES:  Carotid  pulses are 2+ bilaterally with a soft bruit on the right.   IMPRESSION:  Asymptomatic now severe right internal carotid stenosis  appearing by ultrasound longitudinal extent of the disease is unclear.  He  is therefore admitted for elective cerebral angiography.       WED/MEDQ  D:  03/02/2004  T:  03/02/2004  Job:  [id number]

## 2010-10-20 NOTE — Discharge Summary (Signed)
[name], BRYNER NO.:  [id number]   MEDICAL RECORD NO.:  [id number]          PATIENT TYPE:  OIB   LOCATION:  6527                         FACILITY:  [facility]   PHYSICIAN:  [name], M.D. LHCDATE OF BIRTH:  [birth year]   DATE OF ADMISSION:  05/09/2005  DATE OF DISCHARGE:  05/10/2005                                 DISCHARGE SUMMARY   PRIMARY CARDIOLOGIST:  Kirk Ruths, M.D. [facility]   DISCHARGE DIAGNOSIS:  Severe left renal artery stenosis.   PROCEDURE PERFORMED DURING THIS HOSPITALIZATION:  A selective left renal  angioplasty with a stent to the left renal artery.   SECONDARY DIAGNOSES:  1.  Coronary artery disease, status post coronary artery bypass grafting.  2.  Chronic atrial fibrillation.  3.  Dyslipidemia.  4.  Peptic ulcer disease.   ALLERGIES:  No known drug allergies.   HISTORY OF PRESENT ILLNESS:  The patient is a 77 year old Caucasian  gentleman with diffuse atherosclerosis. The patient did have evaluation of a  symptomatic carotid artery disease by Dr. Albertine Patricia in May 2004 which had been  managed conservatively. The patient does not have a history of hypertension.  The patient did have a cardiac catheterization that demonstrated renal  artery stenosis in 1994 which he had serial follow up of those renal  arteries. A renal artery ultrasound was done in March 20, 2005, which  demonstrated progression of the left renal artery stenosis such as the  velocity at the origin of the left renal artery is greater than 500 cm per  second with a renal aortic ratio of 5.88, the right renal artery appears  normal, and the right kidney measures 12 x 7 and the left at 11.6 x 5.4 cm.   HOSPITAL COURSE:  The patient was admitted on May 10, 2005, for a renal  artery stent. The patient did well status post renal artery stent. At that  point Dr. Albertine Patricia decided patient needed to continue his Plavix for 30 days.  Coumadin for atrial fibrillation was  restarted after the procedure. The  patient will be discharged to be followed by  Dr. Christy Sartorius PA on May 15, 2005, at 12:30. He will also have a BMET and  an INR on that day in the office. The patient will also have a renal artery  ultrasound follow up in six months. The patient was stable for discharge on  May 10, 2005.     ______________________________  April Humphrey, NP      [name], M.D. [facility]  Electronically Signed    AH/MEDQ  D:  05/10/2005  T:  05/10/2005  Job:  [id number]

## 2010-10-20 NOTE — Consult Note (Signed)
NAME:  [name], [name]                         ACCOUNT NO.:  [id number]   MEDICAL RECORD NO.:  [id number]                   PATIENT TYPE:  OIB   LOCATION:  NA                                   FACILITY:  [place]   PHYSICIAN:  Mark E. Maree Erie, M.D.                DATE OF BIRTH:  1931/07/10   DATE OF CONSULTATION:  DATE OF DISCHARGE:                                   CONSULTATION   REASON FOR CONSULTATION:  Consultation for interpretation of intracranial  views from a cerebral arteriogram done on January 28, 2004, by Dr. [name].   RIGHT INTERNAL CAROTID ARTERIOGRAM:  This vessel is opacified via a common  carotid injection.  There is no siphon stenosis. The anterior and middle  cerebral vessels appear widely patent without evidence of proximal stenosis,  aneurysm or vascular malformation.  The venous and parenchymal phases are  normal.   LEFT INTERNAL CAROTID ARTERIOGRAM:  This vessel is opacified via a common  carotid injection.  The carotid siphon is widely patent.  The anterior and  middle cerebral vessels appeared normal without proximal stenosis, aneurysm,  or vascular malformation.  The venous and parenchymal phases are normal.   IMPRESSION:  Normal appearance of the anterior intracranial circulation.                                               Mark E. Maree Erie, M.D.    MES/MEDQ  D:  02/01/2004  T:  02/02/2004  Job:  [id number]

## 2010-10-20 NOTE — Op Note (Signed)
[name]  Patient:    [name], [name] Visit Number: [id number] MRN: [id number]          Service Type: DSU Location: [facility] Attending Physician:  Debroah Baller Dictated by:   Debroah Baller, M.D. Proc. Date: 07/14/01 Admit Date:  07/14/2001                             Operative Report  PREOPERATIVE DIAGNOSIS:  Left carpal tunnel syndrome.  POSTOPERATIVE DIAGNOSIS:  Left carpal tunnel syndrome.  OPERATION PERFORMED:  Left carpal tunnel release.  SURGEON:  Debroah Baller, M.D.  ASSISTANT:  Liliane Channel, P.A.-C.  ANESTHESIA:  Local with IV sedation.  ESTIMATED BLOOD LOSS:  Minimal.  FLUID REPLACEMENT:  1L of crystalloid.  DRAINS:  None.  TOURNIQUET TIME:  None.  INDICATIONS FOR PROCEDURE:  The patient is a 77 year old man with history of successful right carpal tunnel release in the past, now has left carpal tunnel syndrome with classic signs and symptoms.  He is taken for left carpal tunnel release to decrease pain and increase function.  DESCRIPTION OF PROCEDURE:  The patient was identified by arm band and taken to the operating room at [facility] where the appropriate anesthetic monitors were attached and Bier block anesthesia induced into the right upper extremity at the forearm level distally.  The right hand was then prepped and draped in the usual sterile fashion from the finger tips to the tourniquet and we began the procedure by making a volar incision starting out at the wrist flexion crease going distally and just to the ulnar side of the thenar crease to the subcutaneous tissue.  Distally we made a small nick in the palmar fascia and passed a Soil scientist into the carpal tunnel and kept it volar. We then cut down on the [facility] elevator completing the carpal tunnel release going proximally.  Black-handled scissors were used to extend the fascial incision up into the forearm without  difficulty.  We then probed the carpal tunnel.  The median nerve was found to be intact although hour-glassed down as it went underneath what used to be the transverse carpal ligament.  The wound was washed out with normal saline solution and then the skin only was closed with running 4-0 nylon suture after small bleeders were identified and cauterized with bipolar.  A dressing of Xeroform, 4 x 4 dressing sponges, Webril and an Ace wrap applied.  The patient was awakened and taken to the recovery room without difficulty. Dictated by:   Debroah Baller, M.D. Attending Physician:  Debroah Baller DD:  07/14/01 TD:  07/14/01 Job: [id number]

## 2010-10-20 NOTE — Op Note (Signed)
NAME:  [name], [name]                         ACCOUNT NO.:  [id number]   MEDICAL RECORD NO.:  [id number]                   PATIENT TYPE:  AMB   LOCATION:  [place]                                  FACILITY:  [facility]   PHYSICIAN:  Kathalene Frames. Mayer Camel, M.D.                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   OPERATION PERFORMED:  Left knee arthroscopic partial medial meniscectomy and  debridement of chondromalacia.   SURGEON:  Kathalene Frames. Mayer Camel, M.D.   ASSISTANTLowell Guitar. Mercie Eon.   ANESTHESIA:  General endotracheal.   ESTIMATED BLOOD LOSS:  Minimal.   FLUIDS REPLACED:  600 mL crystalloids.   DRAINS:  None.   TOURNIQUET TIME:  None.   INDICATIONS FOR PROCEDURE:  The patient is a 77 year old man with clinical  signs and symptoms of medial meniscal tear of the left knee who has failed  conservative treatment with anti-inflammatory medicines, observation and  exercise.  The pain is debilitating, affects his ability to ambulate, go up  and down stairs and he wants to have his knee evaluated arthroscopically.   DESCRIPTION OF PROCEDURE:  The patient was identified by arm band and taken  to the operating room at [place].  Appropriate  anesthetic monitors were attached.  General LMA anesthesia was induced with  the patient in the supine position.  ____________ post applied to the table.  Left lower extremity prepped and draped in the usual sterile fashion from  the ankle to the midthigh and then using a #11 blade, standard inferomedial  and inferolateral peripatellar portals were then made allowing introduction  of the arthroscope through the inferolateral portal and the outflow through  the  inferomedial portal.  Diagnostic arthroscopy revealed grade 3  chondromalacia generalized over the patella which was debrided with a 3.5  Gator sucker shaver.  Moving into the medial compartment, the articular  cartilage was in good condition but the medial and posterior horns of the  medial meniscus had complex parrot beak and degenerative tears which were  debrided back to stable margins with a 3.5 mm Gator sucker shaver and  thoroughly probed.  Part of the meniscal tear was actually going into the  notch and this was also debrided.  The ACL and the PCL were intact.  The  lateral compartment was in good condition and little if any debridement of  the lateral meniscus or articular cartilage was entertained.  The gutters  were cleared.  The scope  was taken medial and lateral to the PCL clearing the posterior compartments.  The knee was irrigated out with normal saline solution and a dressing of  Xeroform, 4 x 4 dressing sponges, Webril and Ace wrap applied.  The patient  was awakened and taken to the recovery room without difficulty.                                               Kathalene Frames. Mayer Camel, M.D.    Alphonsus Sias  D:  08/30/2003  T:  08/30/2003  Job:  [id number]

## 2010-10-20 NOTE — Op Note (Signed)
NAME:  [name], [name] NO.:  [id number]   MEDICAL RECORD NO.:  [id number]                   PATIENT TYPE:  OIB   LOCATION:  2899                                 FACILITY:  [facility]   PHYSICIAN:  [name], M.D. [facility]         DATE OF BIRTH:  [birth year]   DATE OF PROCEDURE:  01/28/2004  DATE OF DISCHARGE:  01/28/2004                                 OPERATIVE REPORT   DESCRIPTION OF PROCEDURE:  Informed consent was obtained.  Under 1%  lidocaine local anesthesia, a 5-French sheath was placed in the right common  femoral arteries in the modified Seldinger technique.  Wholey wire advanced.  Pigtail catheter was advanced over the [facility] wire into the ascending aorta.  Arch aortography was performed by power injection.  This demonstrated a type  1 arch with bovine anatomy.  A 5-French JR-4 catheter was used to  selectively engage the innominate artery.  Angiography was performed by hand  injection.  Wholey wire was advanced into the proximal portion of the right  common carotid artery.  Catheter was advanced over the wire into the  proximal portion of the right common carotid artery.  Angiography was  performed by hand injection in multiple views with imaging of both the  cervical carotid and the intracerebral vasculature.  Catheter was then  removed from the carotid over a wire.  It was then selectively engaged in  the left common carotid.  Angiography was again performed by hand injection  including imaging of the cervical carotid and intracerebral vasculature.  Catheter was then removed over a wire.  The arteriotomy site was closed  using a 6-French Angioseal device.  Complete hemostasis was obtained.  The  patient tolerated the procedure well and was transferred to the holding room  in stable condition.   COMPLICATIONS:  None.   FINDINGS:  1. Aortic arch:  Type 1 arch with bovine anatomy.  No plaquing.  2. Innominate artery:  Normal vessel.  3. Right  carotid:  Normal common and external carotid arteries.  There is an     ulcerated 50% stenosis of the proximal 2.5 cm of the internal carotid     artery.  4. Left carotid:  Normal common and external carotid arteries.  There is a     smooth 40% stenosis of the proximal portion of the internal carotid.   IMPRESSION/RECOMMENDATIONS:  The patient has moderate internal carotid  artery stenosis bilaterally.  Will plan on continued medical therapy.  Coumadin will resume tonight.                                               [name], M.D. [facility]    WED/MEDQ  D:  01/28/2004  T:  01/30/2004  Job:  [id number]   cc:  Kirk Ruths, M.D. [facility]  P.O. [facility]  Fax [phone]

## 2010-10-20 NOTE — Progress Notes (Signed)
[name]                          PERIPHERAL VASCULAR OFFICE NOTE   NAME:[name], [name]                      MRN:          [id number]  DATE:01/11/2006                            DOB:          1932-04-08    REASON FOR VISIT:  Renal artery restenosis.   HISTORY OF PRESENT ILLNESS:  [name] is a 77 year old gentleman status  post stenting of the left renal artery in December 2006.  Surveillance  ultrasound performed December 19, 2005 demonstrated critical end-stent  restenosis on the left with peak systolic velocity of 123456 cm per second and  a renal aortic ratio of 6.9.  Fortunately, the left kidney size remains  stable at 12.1 x 6.9 cm.  He presents today to discuss repeat  revascularization for preservation of kidney function.   PAST MEDICAL HISTORY:  1. Hypertension.  2. Coronary artery disease status post coronary artery bypass grafting in      1994.  3. Chronic atrial fibrillation treated with rate control and      anticoagulation.  No prior history of stroke.  4. Dyslipidemia.  5. Peptic ulcer disease.  6. Status post left knee surgery 1995.  7. Asymptomatic cerebrovascular disease.   ALLERGIES:  NONE KNOWN.   CURRENT MEDICATIONS:  1. Monopril 10 mg per day.  2. Vytorin 10/40 one per day.  3. Prenatal vitamins.  4. Prazosin 5 mg at bedtime.  5. Protonix 40 mg per day.  6. Methocarbamol 750 mg twice per day.  7. Toprol-XL 12.5 mg once per day.  8. Wellbutrin SR 150 mg per day.  9. Iron.  10.Coumadin 5 mg per day.   SOCIAL HISTORY:  The patient is married and remains very active.  Enjoys  camping in his RV.  Quit tobacco in 1994.  Very minimal alcohol.   FAMILY HISTORY:  Mother died at 11 of coronary artery disease.  Father died  at 24 of coronary artery disease.  Has 13 siblings, almost all of whom have  had or have coronary artery disease.   REVIEW OF SYSTEMS:  Negative in detail except as above.   PHYSICAL EXAMINATION:   GENERAL:  Well-appearing, overweight man in no  distress.  Heart rate 68, blood pressure 128/70 on the left and 124/68 on  the right.  Weight is 251 pounds which is stable.  HEENT:  Normal.  SKIN:  Exam is normal.  MUSCULOSKELETAL:  Exam is normal.  He has no jugular venous distension, thyromegaly or lymphadenopathy.  Respiratory effort is normal.  LUNGS:  Clear to auscultation.  He has a nondisplaced point of maximal  cardiac impulse.  There is a 2/6 systolic ejection murmur at the left upper  sternal border with an irregularly irregular rhythm.  ABDOMEN:  Soft, nondistended, nontender.  There is no hepatosplenomegaly.  Bowel sounds are normal.  There is an extremely high-pitched murmur on the  left mid portion of the abdomen.  EXTREMITIES:  Warm without clubbing, cyanosis, edema or ulceration.  Carotid  pulses 2+ bilaterally with soft bruit on the right.   IMPRESSION/RECOMMENDATIONS:  1. Renal artery restenosis.  The patient  appears to have critical in-stent      restenosis.  I have recommended repeat revascularization for      preservation of renal function.  Will proceed to that next week.  Will      hold Coumadin beginning today and plan on procedure next Thursday.      Risks and potential benefits were explained in detail.  He and his wife      agreed to proceed.  2. Cerebrovascular disease.  Due to surveillance ultrasound for his      previously moderate asymptomatic disease.  Will check repeat duplex at      the time of renal duplex after the procedure.  3. Hypertension nicely controlled.  4. Atrial fibrillation, rate controlled.  Will hold Coumadin for upcoming      procedure.  Resume it thereafter.  5. Coronary artery disease.  Cared for by Dr. Stanford Breed.  Asymptomatic.                                   [name] Lyon, MD   WED/MedQ  DD:  01/11/2006  DT:  01/11/2006  Job #:  [id number]   cc:   Denice Bors. Stanford Breed, MD, [facility]

## 2010-10-20 NOTE — Assessment & Plan Note (Signed)
[facility] OFFICE NOTE   NAME:[name], [name]                      MRN:          [id number]  DATE:01/23/2006                            DOB:          06-11-1931    [name] returns for followup today.  Since I last saw him, he has  undergone followup renal Dopplers that showed in-stent restenosis of the  left renal artery.  On January 17, 2006, the patient had a cutting balloon  angioplasty to the left renal artery successfully.  Since then, he has done  well.  There is no dyspnea, chest pain, palpitations or syncope.  There is  no pedal edema.   CURRENT MEDICATIONS:  1. Benazepril 10 mg p.o. daily.  2. Coumadin as directed.  3. Vytorin 10/40 mg p.o. nightly.  4. Prenatal vitamins.  5. Aspirin 81 mg p.o. daily.  6. Terazosin 5 mg p.o. nightly.  7. Protonix 40 mg p.o. daily.  8. Tylenol.  9. Methocarbamol.  10.Toprol 12.5 mg p.o. daily.  11.Celexa.  12.Wellbutrin.  13.Iron.  14.Albuterol.  15.Prednisone.   PHYSICAL EXAMINATION:  VITAL SIGNS:  Blood pressure 120/68, pulse 73.  CHEST:  Clear.  CARDIAC:  Reveals an irregular rhythm.  EXTREMITIES:  No edema.   Electrocardiogram shows atrial fibrillation with a controlled ventricular  response at 64.  There are nonspecific ST changes.   DIAGNOSES:  1. Coronary artery disease status post coronary artery bypass graft.  2. Renal artery stenosis, status post stent of the left renal artery with      cutting balloon for in-stent restenosis.  3. Permanent atrial fibrillation.  4. Cardiovascular disease.  5. Hyperlipidemia.  6. Coumadin therapy.  7. History of anemia.  8. History of intolerance to Niacin.   PLAN:  [name] is doing well from symptomatic standpoint.  We will  continue with his present medications.  He is scheduled to have followup  renal Dopplers on September 6, given his recent procedure.  He will need  followup carotid  Dopplers and renal Dopplers in January to follow his  cerebrovascular disease.  Note, he also has mild aortic stenosis and will  need followup echocardiograms in the future.  His rate is controlled today  and will continue with the present dose of Toprol as well as Coumadin with  goal INR of 2-3.  He is scheduled to have a BMET drawn on September 6, when  he returns and we will add liver functions and lipids to that.  We need to  be aggressive with his cholesterol given his diffuse vascular disease.  We  discussed the importance of diet and exercise.  He does not smoke.  Note,  his Myoview in February 2007, showed inferior wall infarct with mild peri-  infarct ischemia and we have been treating medically.  He will see me back  in 6 months.                              Denice Bors Stanford Breed, MD, [facility]  BSC/MedQ  DD:  01/23/2006  DT:  01/24/2006  Job #:  [id number]

## 2010-10-20 NOTE — Op Note (Signed)
[name], [name] NO.:  [id number]   MEDICAL RECORD NO.:  [id number]          PATIENT TYPE:  OIB   LOCATION:  2854                         FACILITY:  [facility]   PHYSICIAN:  [name], M.D. LHCDATE OF BIRTH:  [birth year]   DATE OF PROCEDURE:  05/09/2005  DATE OF DISCHARGE:                                 OPERATIVE REPORT   PROCEDURES:  1.  Abdominal aortography.  2.  Selective renal angiography.  3.  Stenting of left renal artery.   INDICATIONS:  Mr. Angelos is a 77 year old gentleman with diffuse  atherosclerosis.  He has never had hypertension.  He has had left renal  artery stenosis, which was initially moderate, and has been followed by  serial ultrasounds since 1994.  Most recent ultrasound was performed March 20, 2005.  This demonstrated progression of the left renal artery stenosis  such that the velocity of the arch in the left renal was now greater than  500 cm/sec. with a renal/aortic ratio of 5.9.  Left kidney size was 11.6 x  5.4 compared with a 12.7 on the right.  Due to the critical nature of the  left renal artery stenosis by ultrasound, I and recommended angiography and  possible percutaneous revascularization.   PROCEDURAL TECHNIQUE:  Informed consent was obtained.  Under 1% lidocaine  local anesthesia, a 5-French sheath was placed in the right common femoral  artery using the modified Seldinger technique.  A pigtail catheter was  advanced into the suprarenal abdominal aorta.  Abdominal aortography was  performed by power injection.  This confirmed critical stenosis of left  renal artery.   A decision was very proceed with percutaneous revascularization of left  renal artery stenosis.  Anticoagulation was initiated with heparin.  ACT was  maintained at greater than 250 seconds.  The sheath was upsized over wire to  6-French.  A 6-French LIMA catheter was then used to selectively engage the  left renal artery using no-touch  technique.  A stabilizer was advanced in  the midportion of the renal artery without difficulty.  The lesion was then  dilated using a 5.0 x 15 mm Aviator at 8 atmospheres.  With this, the  patient had transient left flank pain.  Careful comparison was made of the  inflated balloon picture with the post inflation picture.  This did confirm  the despite his pain, the balloon was substantially undersized.  Therefore,  I elected to go with a 6.0 x 18 mm Palmaz  Blue stent.  This was deployed at  10 atmospheres.  Again, the patient had substantial pain.  The balloon was  then pulled back approximately 4 mm and used to further dilate the ostium  again at 10 atmospheres.  Repeat angiography demonstrated approximately a  10% residual stenosis with no dissection and coverage of the ostium.  There  remained normal flow to the renal parenchyma.   Due to the substantial potential pain that the patient had had with these  inflations, I elected not to dilate it further.   I then closed the arteriotomy using a Star Close device.  Complete  hemostasis was obtained.  He was then transferred to holding room in stable  condition.   COMPLICATIONS:  None.   FINDINGS:  1.  Abdominal aorta:  Minimal plaquing without significant stenosis or      evidence of aneurysm.  2.  Right renal artery:  Single vessel.  Minimal plaquing.  3.  Left renal artery:  Single vessel.  There was at least a 95% stenosis in      the proximal vessel, which was stented to less than 10% residual      stenosis.   IMPRESSION/PLAN:  Successful stenting of left renal artery.  The patient  should be maintained on Plavix for 30 days.      [name], M.D. [facility]  Electronically Signed     WED/MEDQ  D:  05/09/2005  T:  05/10/2005  Job:  [id number]   cc:   Kirk Ruths, M.D. [facility]  [address] [place]  [place] [address]

## 2010-10-20 NOTE — Assessment & Plan Note (Signed)
[facility] OFFICE NOTE   NAME:ELLINGTONJefre, Pam                      MRN:          [id number]  DATE:07/10/2006                            DOB:          01-28-1932    Mr. Brueckner is a pleasant gentleman who has a history of coronary  disease, renal artery stenosis, and permanent atrial fibrillation. Since  I last saw him, he is doing well with no dyspnea, chest pain,  palpitations or syncope. There is no pedal edema.   MEDICATIONS:  1. Benazepril 10 mg p.o. daily.  2. Coumadin as directed.  3. Vytorin 10/40 q.h.s.  4. Vitamin.  5. Terazosin 5 mg p.o. q.h.s.  6. Tylenol.  7. Methocarbamol.  8. Celexa.  9. Wellbutrin.  10.Iron.  11.Albuterol.  12.Allopurinol.  13.Prednisone.  14.Metoprolol 12.5 mg p.o. b.i.d.  15.Aspirin 81 mg p.o. daily.  16.Prevacid.   PHYSICAL EXAMINATION:  VITAL SIGNS:  Blood pressure 144/74 and his pulse  is 66.  CHEST:  Clear.  CARDIOVASCULAR:  Reveals an irregular rhythm. He has a 2/6 systolic  murmur in the left sternal border. S2 is preserved.  EXTREMITIES:  Show no edema.   His electrocardiogram  shows atrial fibrillation at a rate of 66. There  is left axis deviation. There is nonspecific ST changes.   DIAGNOSES:  1. Coronary artery disease status post coronary artery bypass graft.  2. History of renal artery stenosis.  3. Permanent atrial fibrillation.  4. Cerebrovascular disease.  5. Hyperlipidemia.  6. Coumadin therapy.  7. History of anemia.  8. History of intolerance to statins.   PLAN:  Mr. Bartik is doing well from a symptomatic standpoint. We  will make sure that he has followup carotid Dopplers today and renal  Dopplers as scheduled. Also note he does have mild aortic stenosis on a  previous echocardiogram and he will most likely need a repeat study in 6  months when I see him back. His most recent Myoview was low risk. We  will continue his present  medications and I will check a CMET today as  well as lipids. His CBC has been followed in [place].     Denice Bors Stanford Breed, MD, [facility]  Electronically Signed    BSC/MedQ  DD: 07/10/2006  DT: 07/10/2006  Job #: [id number]

## 2010-10-20 NOTE — Op Note (Signed)
[name], GRE[place]UT NO.:  [id number]   MEDICAL RECORD NO.:  [id number]          PATIENT TYPE:  AMB   LOCATION:  SDS                          FACILITY:  [facility]   PHYSICIAN:  Kathalene Frames. Mayer Camel, M.D.   DATE OF BIRTH:  Aug 21, 1931   DATE OF PROCEDURE:  09/09/2006  DATE OF DISCHARGE:                               OPERATIVE REPORT   PREOPERATIVE DIAGNOSIS:  Medial meniscal tear of the left knee.   POSTOPERATIVE DIAGNOSIS:  Medial meniscal tear of the left knee with the  addition of chondromalacia lateral tibial plateau, grade 3.   PROCEDURE:  Left knee arthroscopic partial medial meniscectomy,  debridement of grade 3 chondromalacia lateral tibial plateau.   SURGEON:  Kathalene Frames. Mayer Camel, M.D.   ASSISTANT:  Nehemiah Massed PA-C.   ANESTHETIC:  General LMA.   ESTIMATED BLOOD LOSS:  Minimal.   FLUID REPLACEMENT:  800 mL crystalloid.   DRAINS PLACED:  None.   TOURNIQUET TIME:  None.   INDICATIONS FOR PROCEDURE:  77 year old gentleman with an MRI proven  medial meniscal tear with catching, popping and pain in his left knee.  Because of a history of atrial fibrillation requiring, and hypertension,  diabetes and multiple medical problems his arthroscopy is being done  main hospital. Because he has had blood clots in the past I elected to  leave him on the Coumadin full dose and INR preoperatively was 2.2.  Risks and benefits of surgery discussed, questions answered.   DESCRIPTION OF PROCEDURE:  The patient identified by armband, taken the  operating room at [facility].  Appropriate anesthetic monitors  were attached and general LMA anesthesia induced with the patient in  supine position.  Lateral posts applied to the table. Left lower  extremity prepped and draped in usual sterile fashion from the ankle to  the midthigh and using a #11 blade standard inferomedial, inferolateral  peripatellar portals were then made allowing introduction of the  arthroscope through  the inferolateral portal and the outflow through the  inferomedial portal.  Diagnostic arthroscopy revealed chondromalacia of  the apex of the patella previously debrided.  There were no flap tears.  Moving the medial compartment a large parrot beak tear of medial  meniscus was identified and removed using straight biters, large and  small, and 3.5 gator sucker shaver.  Articular cartilage of the medial  tibial plateau and medial femoral condyle was noted to be in good  condition as was the trochlea.  The ACL and PCL were intact.  Moving to  the lateral side the lateral meniscus had some very mild degenerative  tearing.  This was lightly debrided. The lateral tibial plateau had some  chondromalacia grade 3 with cracking that appeared to go almost full  thickness and this was scalloped out and smoothed and debrided to a  stable margin.  The arthroscope was then taken through the notch to  visualize the posterior horns of both menisci which were intact at the  root on the medial side of course it had been debrided pretty  aggressively. The gutters were cleared medially and laterally.  The knee  was irrigated out with normal saline solution.  The arthroscopic  instruments removed.  The patient did receive a gram of Ancef  preoperatively.  A dressing of Xeroform, 4x4 dressing, sponges, Webril  and Ace wrap applied.  The patient was then awakened and taken to the  recovery room without difficulty.      Kathalene Frames. Mayer Camel, M.D.  Electronically Signed     FJR/MEDQ  D:  09/09/2006  T:  09/09/2006  Job:  [id number]

## 2010-11-15 ENCOUNTER — Encounter: Payer: Self-pay | Admitting: Cardiology

## 2011-04-05 ENCOUNTER — Encounter: Payer: Self-pay | Admitting: Cardiology

## 2011-04-05 ENCOUNTER — Ambulatory Visit (INDEPENDENT_AMBULATORY_CARE_PROVIDER_SITE_OTHER): Payer: Medicare Other | Admitting: Cardiology

## 2011-04-05 VITALS — BP 131/70 | HR 77 | Ht 72.0 in | Wt 239.0 lb

## 2011-04-05 DIAGNOSIS — I6529 Occlusion and stenosis of unspecified carotid artery: Secondary | ICD-10-CM

## 2011-04-05 DIAGNOSIS — I359 Nonrheumatic aortic valve disorder, unspecified: Secondary | ICD-10-CM

## 2011-04-05 DIAGNOSIS — I2510 Atherosclerotic heart disease of native coronary artery without angina pectoris: Secondary | ICD-10-CM

## 2011-04-05 DIAGNOSIS — E785 Hyperlipidemia, unspecified: Secondary | ICD-10-CM

## 2011-04-05 DIAGNOSIS — I10 Essential (primary) hypertension: Secondary | ICD-10-CM

## 2011-04-05 DIAGNOSIS — I4891 Unspecified atrial fibrillation: Secondary | ICD-10-CM

## 2011-04-05 DIAGNOSIS — I701 Atherosclerosis of renal artery: Secondary | ICD-10-CM

## 2011-04-05 NOTE — Assessment & Plan Note (Signed)
Blood pressure controlled. Continue present medications. 

## 2011-04-05 NOTE — Patient Instructions (Addendum)
Your physician wants you to follow-up in: MAY 2013 You will receive a reminder letter in the mail two months in advance. If you don't receive a letter, please call our office to schedule the follow-up appointment.    STOP ASPIRIN

## 2011-04-05 NOTE — Assessment & Plan Note (Signed)
Continue pradaxa and lopressor. Recent hemoglobin and renal function normal.

## 2011-04-05 NOTE — Assessment & Plan Note (Signed)
followup renal Dopplers May 2013.

## 2011-04-05 NOTE — Progress Notes (Signed)
HPI:[name] is a very pleasant gentleman with a history of coronary artery disease, status post coronary artery bypassing graft, permanent atrial fibrillation and peripheral vascular disease (renal artery stenosis and cerebrovascular disease). His last Myoview was performed in May of 2011. He had an ejection fraction of 58% with inferior thinning versus prior infarct but no ischemia. An echocardiogram in May of 2011 showed LV function normal. There was mild aortic stenosis with a mean gradient at 13 mmHg and mild AI. There was biatrial enlargement. His last carotid Dopplers were performed in May of 2012. This revealed 60-79% bilateral stenosis and followup was recommended in six months. Renal Dopplers were performed in May of 2012 and revealed 1-59% bilateral stenosis. Followup recommended in one year. I last saw him in April of 2012. Since then the patient denies any dyspnea on exertion, orthopnea, PND, pedal edema, palpitations, syncope or chest pain.   Current Outpatient Prescriptions  Medication Sig Dispense Refill  . acetaminophen (TYLENOL) 500 MG tablet Take 500 mg by mouth every 6 (six) hours as needed.        [name] acetaminophen-codeine (TYLENOL #2) 300-15 MG per tablet Take 1 tablet by mouth as directed.        [name] acyclovir (ZOVIRAX) 800 MG tablet 1/2 tab po bid       . albuterol (PROVENTIL,VENTOLIN) 90 MCG/ACT inhaler Inhale 2 puffs into the lungs 4 (four) times daily.        [name] allopurinol (ZYLOPRIM) 300 MG tablet Take 300 mg by mouth daily.        [name] aspirin 81 MG tablet Take 81 mg by mouth daily.        . benazepril (LOTENSIN) 20 MG tablet Take 20 mg by mouth daily.        . budesonide-formoterol (SYMBICORT) 160-4.5 MCG/ACT inhaler Inhale 2 puffs into the lungs 2 (two) times daily.        [name] buPROPion (WELLBUTRIN SR) 150 MG 12 hr tablet Take 150 mg by mouth daily.       . Cholecalciferol (VITAMIN D) 2000 UNITS CAPS Take 1 capsule by mouth daily.        . citalopram (CELEXA) 40 MG tablet  Take by mouth daily. 1/2 tablet daily       . dabigatran (PRADAXA) 150 MG CAPS Take 150 mg by mouth every 12 (twelve) hours.        [name] ezetimibe-simvastatin (VYTORIN) 10-40 MG per tablet Take 1 tablet by mouth at bedtime.  30 tablet  11  . ferrous sulfate 325 (65 FE) MG tablet Take 325 mg by mouth daily with breakfast.        . furosemide (LASIX) 20 MG tablet Take 20 mg by mouth daily.        [name] ipratropium-albuterol (DUONEB) 0.5-2.5 (3) MG/3ML SOLN Take 3 mLs by nebulization as directed.        . lansoprazole (PREVACID) 30 MG capsule Take 30 mg by mouth daily.        . methocarbamol (ROBAXIN) 750 MG tablet Take 750 mg by mouth 2 (two) times daily.       . metoprolol (LOPRESSOR) 50 MG tablet Take by mouth. 1/2 tablet two times daily       . mometasone (NASONEX) 50 MCG/ACT nasal spray 2 sprays by Nasal route daily.        . Multiple Vitamin (MULTIVITAMIN) tablet Take 1 tablet by mouth daily.        . predniSONE (DELTASONE) 20 MG tablet Take 20  mg by mouth as directed.        . terazosin (HYTRIN) 5 MG capsule Take 5 mg by mouth at bedtime.        . trolamine salicylate (ASPERCREME) 10 % cream Apply topically as needed.           Past Medical History  Diagnosis Date  . History of peptic ulcer disease   . Anemia   . Hyperlipidemia   . Aortic stenosis    . Peripheral vascular disease   . Atrial fibrillation   . Carotid stenosis   . Renal artery stenosis   . CAD (coronary artery disease)   . Hypertension   . Dyslipidemia   . History of echocardiogram 10/06/2009    07/31/2005 -- 01/17/2007 -- 03/03/2008 & 10/06/2009    Past Surgical History  Procedure Date  . Coronary artery bypass graft 1994  . Renal artery stent     stenting of the left renal artery as well as a cutting balloon angioplasty for treatment of in-stent restenosis coronary artery bypass grafting in 1994.  [name] Knee surgery 08/2003    left knee    History   Social History  . Marital Status: Married    Spouse Name: N/A      Number of Children: N/A  . Years of Education: N/A   Occupational History  . Not on file.   Social History Main Topics  . Smoking status: Former Research scientist (life sciences)  . Smokeless tobacco: Not on file  . Alcohol Use: Not on file  . Drug Use: No  . Sexually Active: Yes   Other Topics Concern  . Not on file   Social History Narrative   Social History:Married Tobacco Use - No. Alcohol Use - yesFamily History:Mother died at age 25 of coronary disease.  Father died at 12 of coronary disease.  He has 13 siblings, almost all of whom have coronary disease and some with premature onset.    ROS: no fevers or chills, productive cough, hemoptysis, dysphasia, odynophagia, melena, hematochezia, dysuria, hematuria, rash, seizure activity, orthopnea, PND, pedal edema, claudication. Remaining systems are negative.  Physical Exam: Well-developed well-nourished in no acute distress.  Skin is warm and dry.  HEENT is normal.  Neck is supple. No thyromegaly.  Chest is clear to auscultation with normal expansion.  Cardiovascular exam is irregular, 3/6 systolic murmur left sternal border. Abdominal exam nontender or distended. No masses palpated. Extremities show no edema. neuro grossly intact  ECG atrial fibrillation at a rate of 73. Left anterior fascicular block. Cannot rule out prior inferior infarct.

## 2011-04-05 NOTE — Assessment & Plan Note (Signed)
Continue present medications. Discontinue aspirin given use of pradaxa.

## 2011-04-05 NOTE — Assessment & Plan Note (Signed)
Continue statin. Lipids and liver monitored by primary care. 

## 2011-04-05 NOTE — Assessment & Plan Note (Signed)
Plan repeat echocardiogram in May 2013.

## 2011-04-05 NOTE — Assessment & Plan Note (Signed)
Continue statin. Followup carotid Dopplers this month.

## 2011-04-17 ENCOUNTER — Other Ambulatory Visit: Payer: Self-pay | Admitting: Cardiology

## 2011-04-17 DIAGNOSIS — R0989 Other specified symptoms and signs involving the circulatory and respiratory systems: Secondary | ICD-10-CM

## 2011-04-18 ENCOUNTER — Encounter (INDEPENDENT_AMBULATORY_CARE_PROVIDER_SITE_OTHER): Payer: Medicare Other | Admitting: Cardiology

## 2011-04-18 DIAGNOSIS — R0989 Other specified symptoms and signs involving the circulatory and respiratory systems: Secondary | ICD-10-CM

## 2011-04-18 DIAGNOSIS — I6529 Occlusion and stenosis of unspecified carotid artery: Secondary | ICD-10-CM

## 2011-05-02 ENCOUNTER — Telehealth: Payer: Self-pay | Admitting: Cardiology

## 2011-05-02 ENCOUNTER — Encounter: Payer: Self-pay | Admitting: *Deleted

## 2011-05-02 NOTE — Telephone Encounter (Signed)
Spoke with pt wife, aware of dr Jacalyn Lefevre recommendations. Letter placed at the front desk for them to pick up [name]

## 2011-05-02 NOTE — Telephone Encounter (Signed)
Ok to hold pradaxa 2 days prior to procedure; resume after surgery when ok with surgeons Kirk Ruths

## 2011-05-02 NOTE — Telephone Encounter (Signed)
Spoke with pt wife, pt is needing to have a spot removed from the back of his penis at the [facility] Tuesday next week. They want the pt to stop his pradaxa today for the procedure Will forward for dr Stanford Breed review Fredia Beets

## 2011-05-02 NOTE — Telephone Encounter (Signed)
New Msg: Pt wife calling stating that pt is having surgery on Tuesday and pt wife needs letter stating that pt can stop taking pradaxa prior to surgery. Please return pt wife call to discuss further.

## 2011-07-03 ENCOUNTER — Telehealth: Payer: Self-pay | Admitting: Cardiology

## 2011-07-03 NOTE — Telephone Encounter (Signed)
Spoke with pt wife, [facility]

## 2011-07-03 NOTE — Telephone Encounter (Signed)
New Problem   Patient wife Eulis Foster would like a return call from nurse DM. She can be reached at hm#

## 2011-07-05 ENCOUNTER — Other Ambulatory Visit: Payer: Self-pay | Admitting: Cardiology

## 2011-07-05 MED ORDER — EZETIMIBE-SIMVASTATIN 10-40 MG PO TABS: 1.0000 | ORAL_TABLET | Freq: Every day | ORAL | Status: DC

## 2011-07-23 DIAGNOSIS — Z6833 Body mass index (BMI) 33.0-33.9, adult: Secondary | ICD-10-CM | POA: Diagnosis not present

## 2011-07-23 DIAGNOSIS — Z79899 Other long term (current) drug therapy: Secondary | ICD-10-CM | POA: Diagnosis not present

## 2011-07-23 DIAGNOSIS — I2510 Atherosclerotic heart disease of native coronary artery without angina pectoris: Secondary | ICD-10-CM | POA: Diagnosis not present

## 2011-07-23 DIAGNOSIS — I4891 Unspecified atrial fibrillation: Secondary | ICD-10-CM | POA: Diagnosis not present

## 2011-07-23 DIAGNOSIS — D509 Iron deficiency anemia, unspecified: Secondary | ICD-10-CM | POA: Diagnosis not present

## 2011-07-23 DIAGNOSIS — E119 Type 2 diabetes mellitus without complications: Secondary | ICD-10-CM | POA: Diagnosis not present

## 2011-07-23 DIAGNOSIS — E785 Hyperlipidemia, unspecified: Secondary | ICD-10-CM | POA: Diagnosis not present

## 2011-07-23 DIAGNOSIS — J449 Chronic obstructive pulmonary disease, unspecified: Secondary | ICD-10-CM | POA: Diagnosis not present

## 2011-07-23 DIAGNOSIS — Z125 Encounter for screening for malignant neoplasm of prostate: Secondary | ICD-10-CM | POA: Diagnosis not present

## 2011-08-02 DIAGNOSIS — L820 Inflamed seborrheic keratosis: Secondary | ICD-10-CM | POA: Diagnosis not present

## 2011-09-04 ENCOUNTER — Encounter: Payer: Self-pay | Admitting: Cardiology

## 2011-09-10 DIAGNOSIS — J209 Acute bronchitis, unspecified: Secondary | ICD-10-CM | POA: Diagnosis not present

## 2011-09-10 DIAGNOSIS — Z6834 Body mass index (BMI) 34.0-34.9, adult: Secondary | ICD-10-CM | POA: Diagnosis not present

## 2011-09-10 DIAGNOSIS — J019 Acute sinusitis, unspecified: Secondary | ICD-10-CM | POA: Diagnosis not present

## 2011-09-26 DIAGNOSIS — J440 Chronic obstructive pulmonary disease with acute lower respiratory infection: Secondary | ICD-10-CM | POA: Diagnosis not present

## 2011-09-26 DIAGNOSIS — Z6834 Body mass index (BMI) 34.0-34.9, adult: Secondary | ICD-10-CM | POA: Diagnosis not present

## 2011-09-26 DIAGNOSIS — J019 Acute sinusitis, unspecified: Secondary | ICD-10-CM | POA: Diagnosis not present

## 2011-10-18 ENCOUNTER — Encounter: Payer: Self-pay | Admitting: Cardiology

## 2011-10-18 ENCOUNTER — Ambulatory Visit (INDEPENDENT_AMBULATORY_CARE_PROVIDER_SITE_OTHER): Payer: Medicare Other | Admitting: Cardiology

## 2011-10-18 VITALS — BP 125/66 | HR 66 | Ht 72.0 in | Wt 250.0 lb

## 2011-10-18 DIAGNOSIS — I6529 Occlusion and stenosis of unspecified carotid artery: Secondary | ICD-10-CM | POA: Diagnosis not present

## 2011-10-18 DIAGNOSIS — I2510 Atherosclerotic heart disease of native coronary artery without angina pectoris: Secondary | ICD-10-CM

## 2011-10-18 DIAGNOSIS — I359 Nonrheumatic aortic valve disorder, unspecified: Secondary | ICD-10-CM | POA: Diagnosis not present

## 2011-10-18 DIAGNOSIS — E785 Hyperlipidemia, unspecified: Secondary | ICD-10-CM

## 2011-10-18 DIAGNOSIS — I4891 Unspecified atrial fibrillation: Secondary | ICD-10-CM

## 2011-10-18 DIAGNOSIS — I701 Atherosclerosis of renal artery: Secondary | ICD-10-CM | POA: Diagnosis not present

## 2011-10-18 DIAGNOSIS — I10 Essential (primary) hypertension: Secondary | ICD-10-CM

## 2011-10-18 DIAGNOSIS — I350 Nonrheumatic aortic (valve) stenosis: Secondary | ICD-10-CM

## 2011-10-18 NOTE — Assessment & Plan Note (Signed)
Continue statin. Not on aspirin given need for anticoagulation. Repeat carotid Dopplers.

## 2011-10-18 NOTE — Assessment & Plan Note (Signed)
Repeat echocardiogram. 

## 2011-10-18 NOTE — Assessment & Plan Note (Signed)
Blood pressure controlled. Continue present medications. 

## 2011-10-18 NOTE — Assessment & Plan Note (Signed)
Continue beta blocker and pradaxa. Renal function and hemoglobin followed by primary care.

## 2011-10-18 NOTE — Assessment & Plan Note (Signed)
Continue statin. Lipids and liver monitored by primary care. 

## 2011-10-18 NOTE — Assessment & Plan Note (Signed)
Continue statin. 

## 2011-10-18 NOTE — Assessment & Plan Note (Signed)
Repeat renal Dopplers.

## 2011-10-18 NOTE — Patient Instructions (Signed)
Your physician wants you to follow-up in: [place] will receive a reminder letter in the mail two months in advance. If you don't receive a letter, please call our office to schedule the follow-up appointment.   Your physician has requested that you have a carotid duplex. This test is an ultrasound of the carotid arteries in your neck. It looks at blood flow through these arteries that supply the brain with blood. Allow one hour for this exam. There are no restrictions or special instructions.   Your physician has requested that you have a renal artery duplex. During this test, an ultrasound is used to evaluate blood flow to the kidneys. Allow one hour for this exam. Do not eat after midnight the day before and avoid carbonated beverages. Take your medications as you usually do.   Your physician has requested that you have an echocardiogram. Echocardiography is a painless test that uses sound waves to create images of your heart. It provides your doctor with information about the size and shape of your heart and how well your heart's chambers and valves are working. This procedure takes approximately one hour. There are no restrictions for this procedure.

## 2011-10-18 NOTE — Progress Notes (Signed)
HPI: [name] is a very pleasant gentleman with a history of coronary artery disease, status post coronary artery bypassing graft, permanent atrial fibrillation and peripheral vascular disease (renal artery stenosis and cerebrovascular disease). His last Myoview was performed in May of 2011. He had an ejection fraction of 58% with inferior thinning versus prior infarct but no ischemia. An echocardiogram in May of 2011 showed LV function normal. There was mild aortic stenosis with a mean gradient at 13 mmHg and mild AI. There was biatrial enlargement. His last carotid Dopplers were performed in Nov of 2012. This revealed 60-79% bilateral stenosis and followup was recommended in six months. Renal Dopplers were performed in May of 2012 and revealed 1-59% bilateral stenosis. Followup recommended in one year. I last saw him in Nov of 2012. Since then the patient denies any dyspnea on exertion, orthopnea, PND, pedal edema, palpitations, syncope or chest pain. Note BUN, creatinine and hemoglobin in February of 2013 at his primary care physician's office were normal.   Current Outpatient Prescriptions  Medication Sig Dispense Refill  . acetaminophen (TYLENOL) 500 MG tablet Take 500 mg by mouth every 6 (six) hours as needed.        [name] acetaminophen-codeine (TYLENOL #2) 300-15 MG per tablet Take 1 tablet by mouth as directed.        [name] acyclovir (ZOVIRAX) 800 MG tablet 1/2 tab po bid       . albuterol (PROVENTIL,VENTOLIN) 90 MCG/ACT inhaler Inhale 2 puffs into the lungs 4 (four) times daily.        [name] allopurinol (ZYLOPRIM) 300 MG tablet Take 300 mg by mouth daily.        . benazepril (LOTENSIN) 20 MG tablet Take 20 mg by mouth daily.        . budesonide-formoterol (SYMBICORT) 160-4.5 MCG/ACT inhaler Inhale 2 puffs into the lungs 2 (two) times daily.        [name] buPROPion (WELLBUTRIN SR) 150 MG 12 hr tablet Take 150 mg by mouth daily.       . Cholecalciferol (VITAMIN D) 2000 UNITS CAPS Take 1 capsule by mouth  daily.        . citalopram (CELEXA) 40 MG tablet Take by mouth daily. 1/2 tablet daily       . dabigatran (PRADAXA) 150 MG CAPS Take 150 mg by mouth every 12 (twelve) hours.        [name] ezetimibe-simvastatin (VYTORIN) 10-40 MG per tablet Take 1 tablet by mouth at bedtime.  90 tablet  3  . ferrous sulfate 325 (65 FE) MG tablet Take 325 mg by mouth daily with breakfast.        . furosemide (LASIX) 20 MG tablet Take 20 mg by mouth daily.        [name] ipratropium-albuterol (DUONEB) 0.5-2.5 (3) MG/3ML SOLN Take 3 mLs by nebulization as directed.        . lansoprazole (PREVACID) 30 MG capsule Take 30 mg by mouth daily.        . methocarbamol (ROBAXIN) 750 MG tablet Take 750 mg by mouth 2 (two) times daily.       . metoprolol (LOPRESSOR) 50 MG tablet Take by mouth. 1/2 tablet two times daily       . mometasone (NASONEX) 50 MCG/ACT nasal spray 2 sprays by Nasal route daily.        . Multiple Vitamin (MULTIVITAMIN) tablet Take 1 tablet by mouth daily.        . predniSONE (DELTASONE) 20 MG tablet Take  20 mg by mouth as directed.        . terazosin (HYTRIN) 5 MG capsule Take 5 mg by mouth at bedtime.        . trolamine salicylate (ASPERCREME) 10 % cream Apply topically as needed.           Past Medical History  Diagnosis Date  . History of peptic ulcer disease   . Anemia   . Hyperlipidemia   . Aortic stenosis    . Peripheral vascular disease   . Atrial fibrillation   . Carotid stenosis   . Renal artery stenosis   . CAD (coronary artery disease)   . Hypertension     Past Surgical History  Procedure Date  . Coronary artery bypass graft 1994  . Renal artery stent     stenting of the left renal artery as well as a cutting balloon angioplasty for treatment of in-stent restenosis coronary artery bypass grafting in 1994.  [name] Knee surgery 08/2003    left knee    History   Social History  . Marital Status: Married    Spouse Name: N/A    Number of Children: N/A  . Years of Education: N/A    Occupational History  . Not on file.   Social History Main Topics  . Smoking status: Former Research scientist (life sciences)  . Smokeless tobacco: Not on file  . Alcohol Use: Not on file  . Drug Use: No  . Sexually Active: Yes   Other Topics Concern  . Not on file   Social History Narrative   Social History:Married Tobacco Use - No. Alcohol Use - yesFamily History:Mother died at age 91 of coronary disease.  Father died at 63 of coronary disease.  He has 13 siblings, almost all of whom have coronary disease and some with premature onset.    ROS: no fevers or chills, productive cough, hemoptysis, dysphasia, odynophagia, melena, hematochezia, dysuria, hematuria, rash, seizure activity, orthopnea, PND, pedal edema, claudication. Remaining systems are negative.  Physical Exam: Well-developed well-nourished in no acute distress.  Skin is warm and dry.  HEENT is normal.  Neck is supple.  Chest is clear to auscultation with normal expansion.  Cardiovascular exam is irregular Abdominal exam nontender or distended. No masses palpated. Extremities show no edema. neuro grossly intact  ECG atrial fibrillation at a rate of 66. Left axis deviation. Cannot rule out prior inferior infarct.

## 2011-11-02 ENCOUNTER — Encounter (INDEPENDENT_AMBULATORY_CARE_PROVIDER_SITE_OTHER): Payer: Medicare Other

## 2011-11-02 DIAGNOSIS — I6529 Occlusion and stenosis of unspecified carotid artery: Secondary | ICD-10-CM

## 2011-11-05 DIAGNOSIS — I4891 Unspecified atrial fibrillation: Secondary | ICD-10-CM | POA: Diagnosis not present

## 2011-11-05 DIAGNOSIS — I2510 Atherosclerotic heart disease of native coronary artery without angina pectoris: Secondary | ICD-10-CM | POA: Diagnosis not present

## 2011-11-05 DIAGNOSIS — E119 Type 2 diabetes mellitus without complications: Secondary | ICD-10-CM | POA: Diagnosis not present

## 2011-11-05 DIAGNOSIS — E785 Hyperlipidemia, unspecified: Secondary | ICD-10-CM | POA: Diagnosis not present

## 2011-11-05 DIAGNOSIS — D509 Iron deficiency anemia, unspecified: Secondary | ICD-10-CM | POA: Diagnosis not present

## 2011-11-05 DIAGNOSIS — Z6834 Body mass index (BMI) 34.0-34.9, adult: Secondary | ICD-10-CM | POA: Diagnosis not present

## 2011-11-09 ENCOUNTER — Ambulatory Visit (HOSPITAL_COMMUNITY): Payer: Medicare Other | Attending: Internal Medicine | Admitting: Radiology

## 2011-11-09 DIAGNOSIS — E669 Obesity, unspecified: Secondary | ICD-10-CM | POA: Diagnosis not present

## 2011-11-09 DIAGNOSIS — I359 Nonrheumatic aortic valve disorder, unspecified: Secondary | ICD-10-CM | POA: Diagnosis not present

## 2011-11-09 DIAGNOSIS — I10 Essential (primary) hypertension: Secondary | ICD-10-CM | POA: Insufficient documentation

## 2011-11-09 DIAGNOSIS — I059 Rheumatic mitral valve disease, unspecified: Secondary | ICD-10-CM | POA: Diagnosis not present

## 2011-11-09 DIAGNOSIS — E785 Hyperlipidemia, unspecified: Secondary | ICD-10-CM | POA: Diagnosis not present

## 2011-11-09 DIAGNOSIS — I350 Nonrheumatic aortic (valve) stenosis: Secondary | ICD-10-CM

## 2011-11-09 DIAGNOSIS — Z87891 Personal history of nicotine dependence: Secondary | ICD-10-CM | POA: Insufficient documentation

## 2011-11-09 DIAGNOSIS — I4891 Unspecified atrial fibrillation: Secondary | ICD-10-CM | POA: Diagnosis not present

## 2011-11-09 DIAGNOSIS — I517 Cardiomegaly: Secondary | ICD-10-CM | POA: Diagnosis not present

## 2011-11-09 NOTE — Progress Notes (Signed)
Echocardiogram performed.  

## 2011-11-16 ENCOUNTER — Encounter (INDEPENDENT_AMBULATORY_CARE_PROVIDER_SITE_OTHER): Payer: Medicare Other

## 2011-11-16 DIAGNOSIS — I701 Atherosclerosis of renal artery: Secondary | ICD-10-CM | POA: Diagnosis not present

## 2012-01-09 DIAGNOSIS — J019 Acute sinusitis, unspecified: Secondary | ICD-10-CM | POA: Diagnosis not present

## 2012-01-09 DIAGNOSIS — Z6835 Body mass index (BMI) 35.0-35.9, adult: Secondary | ICD-10-CM | POA: Diagnosis not present

## 2012-01-09 DIAGNOSIS — J209 Acute bronchitis, unspecified: Secondary | ICD-10-CM | POA: Diagnosis not present

## 2012-01-16 DIAGNOSIS — IMO0002 Reserved for concepts with insufficient information to code with codable children: Secondary | ICD-10-CM | POA: Diagnosis not present

## 2012-02-05 ENCOUNTER — Encounter: Payer: Self-pay | Admitting: Cardiology

## 2012-02-11 DIAGNOSIS — Z6834 Body mass index (BMI) 34.0-34.9, adult: Secondary | ICD-10-CM | POA: Diagnosis not present

## 2012-02-11 DIAGNOSIS — I2510 Atherosclerotic heart disease of native coronary artery without angina pectoris: Secondary | ICD-10-CM | POA: Diagnosis not present

## 2012-02-11 DIAGNOSIS — E785 Hyperlipidemia, unspecified: Secondary | ICD-10-CM | POA: Diagnosis not present

## 2012-02-11 DIAGNOSIS — I4891 Unspecified atrial fibrillation: Secondary | ICD-10-CM | POA: Diagnosis not present

## 2012-02-22 DIAGNOSIS — J209 Acute bronchitis, unspecified: Secondary | ICD-10-CM | POA: Diagnosis not present

## 2012-02-22 DIAGNOSIS — R079 Chest pain, unspecified: Secondary | ICD-10-CM | POA: Diagnosis not present

## 2012-02-22 DIAGNOSIS — R609 Edema, unspecified: Secondary | ICD-10-CM | POA: Diagnosis not present

## 2012-02-22 DIAGNOSIS — J019 Acute sinusitis, unspecified: Secondary | ICD-10-CM | POA: Diagnosis not present

## 2012-03-11 DIAGNOSIS — Z23 Encounter for immunization: Secondary | ICD-10-CM | POA: Diagnosis not present

## 2012-04-28 ENCOUNTER — Other Ambulatory Visit: Payer: Self-pay | Admitting: Cardiology

## 2012-04-28 DIAGNOSIS — I6529 Occlusion and stenosis of unspecified carotid artery: Secondary | ICD-10-CM

## 2012-05-02 ENCOUNTER — Encounter (INDEPENDENT_AMBULATORY_CARE_PROVIDER_SITE_OTHER): Payer: Medicare Other

## 2012-05-02 DIAGNOSIS — I6529 Occlusion and stenosis of unspecified carotid artery: Secondary | ICD-10-CM

## 2012-05-23 ENCOUNTER — Encounter: Payer: Self-pay | Admitting: Cardiology

## 2012-05-23 DIAGNOSIS — I2510 Atherosclerotic heart disease of native coronary artery without angina pectoris: Secondary | ICD-10-CM | POA: Diagnosis not present

## 2012-05-23 DIAGNOSIS — E785 Hyperlipidemia, unspecified: Secondary | ICD-10-CM | POA: Diagnosis not present

## 2012-05-23 DIAGNOSIS — Z6835 Body mass index (BMI) 35.0-35.9, adult: Secondary | ICD-10-CM | POA: Diagnosis not present

## 2012-05-23 DIAGNOSIS — D509 Iron deficiency anemia, unspecified: Secondary | ICD-10-CM | POA: Diagnosis not present

## 2012-05-23 DIAGNOSIS — I4891 Unspecified atrial fibrillation: Secondary | ICD-10-CM | POA: Diagnosis not present

## 2012-05-23 DIAGNOSIS — E119 Type 2 diabetes mellitus without complications: Secondary | ICD-10-CM | POA: Diagnosis not present

## 2012-05-24 ENCOUNTER — Other Ambulatory Visit: Payer: Self-pay | Admitting: Cardiology

## 2012-07-22 DIAGNOSIS — H6690 Otitis media, unspecified, unspecified ear: Secondary | ICD-10-CM | POA: Diagnosis not present

## 2012-07-22 DIAGNOSIS — Z6835 Body mass index (BMI) 35.0-35.9, adult: Secondary | ICD-10-CM | POA: Diagnosis not present

## 2012-07-22 DIAGNOSIS — J019 Acute sinusitis, unspecified: Secondary | ICD-10-CM | POA: Diagnosis not present

## 2012-09-05 ENCOUNTER — Encounter: Payer: Self-pay | Admitting: Cardiology

## 2012-09-05 DIAGNOSIS — I4891 Unspecified atrial fibrillation: Secondary | ICD-10-CM | POA: Diagnosis not present

## 2012-09-05 DIAGNOSIS — Z6834 Body mass index (BMI) 34.0-34.9, adult: Secondary | ICD-10-CM | POA: Diagnosis not present

## 2012-09-05 DIAGNOSIS — E119 Type 2 diabetes mellitus without complications: Secondary | ICD-10-CM | POA: Diagnosis not present

## 2012-09-05 DIAGNOSIS — I2510 Atherosclerotic heart disease of native coronary artery without angina pectoris: Secondary | ICD-10-CM | POA: Diagnosis not present

## 2012-09-05 DIAGNOSIS — D509 Iron deficiency anemia, unspecified: Secondary | ICD-10-CM | POA: Diagnosis not present

## 2012-09-05 DIAGNOSIS — E785 Hyperlipidemia, unspecified: Secondary | ICD-10-CM | POA: Diagnosis not present

## 2012-10-01 ENCOUNTER — Other Ambulatory Visit: Payer: Self-pay | Admitting: *Deleted

## 2012-10-01 DIAGNOSIS — E7800 Pure hypercholesterolemia, unspecified: Secondary | ICD-10-CM

## 2012-10-01 MED ORDER — SIMVASTATIN 40 MG PO TABS: 40.0000 mg | ORAL_TABLET | Freq: Every day | ORAL | Status: DC

## 2012-10-17 ENCOUNTER — Ambulatory Visit (INDEPENDENT_AMBULATORY_CARE_PROVIDER_SITE_OTHER): Payer: Medicare Other | Admitting: Cardiology

## 2012-10-17 ENCOUNTER — Encounter: Payer: Self-pay | Admitting: Cardiology

## 2012-10-17 VITALS — BP 140/80 | HR 96 | Wt 248.0 lb

## 2012-10-17 DIAGNOSIS — I4891 Unspecified atrial fibrillation: Secondary | ICD-10-CM | POA: Diagnosis not present

## 2012-10-17 DIAGNOSIS — I359 Nonrheumatic aortic valve disorder, unspecified: Secondary | ICD-10-CM | POA: Diagnosis not present

## 2012-10-17 DIAGNOSIS — I701 Atherosclerosis of renal artery: Secondary | ICD-10-CM

## 2012-10-17 DIAGNOSIS — E785 Hyperlipidemia, unspecified: Secondary | ICD-10-CM

## 2012-10-17 DIAGNOSIS — I2510 Atherosclerotic heart disease of native coronary artery without angina pectoris: Secondary | ICD-10-CM

## 2012-10-17 DIAGNOSIS — I10 Essential (primary) hypertension: Secondary | ICD-10-CM

## 2012-10-17 NOTE — Progress Notes (Signed)
HPI: [name] is a very pleasant gentleman with a history of coronary artery disease, status post coronary artery bypassing graft, permanent atrial fibrillation and peripheral vascular disease (renal artery stenosis and cerebrovascular disease). His last Myoview was performed in May of 2011. He had an ejection fraction of 58% with inferior thinning versus prior infarct but no ischemia. Echocardiogram in June 2013 showed normal LV function, mild left ventricular hypertrophy, mild aortic stenosis/aortic insufficiency, mild mitral regurgitation and moderate left atrial enlargement. Renal Dopplers were performed in June 2013 and revealed 1-59% bilateral stenosis. Last carotid Dopplers in November of 2013 showed 60-79% bilateral stenosis and followup recommended in 6 months. I last saw [name] in May of 2013. Since then the patient denies any dyspnea on exertion, orthopnea, PND, pedal edema, palpitations, syncope or chest pain.    Current Outpatient Prescriptions  Medication Sig Dispense Refill  . acetaminophen (TYLENOL) 500 MG tablet Take 500 mg by mouth every 6 (six) hours as needed.        [name] acetaminophen-codeine (TYLENOL #2) 300-15 MG per tablet Take 1 tablet by mouth as directed.        [name] acyclovir (ZOVIRAX) 800 MG tablet 1/2 tab po bid       . albuterol (PROVENTIL,VENTOLIN) 90 MCG/ACT inhaler Inhale 2 puffs into the lungs 4 (four) times daily.        [name] allopurinol (ZYLOPRIM) 300 MG tablet Take 300 mg by mouth daily.        [name] aspirin 81 MG tablet Take 81 mg by mouth daily.      . benazepril (LOTENSIN) 20 MG tablet Take 20 mg by mouth daily.        . budesonide-formoterol (SYMBICORT) 160-4.5 MCG/ACT inhaler Inhale 2 puffs into the lungs 2 (two) times daily.        [name] buPROPion (WELLBUTRIN SR) 150 MG 12 hr tablet Take 150 mg by mouth daily.       . citalopram (CELEXA) 40 MG tablet Take by mouth daily. 1/2 tablet daily       . dabigatran (PRADAXA) 150 MG CAPS Take 150 mg by mouth every 12 (twelve)  hours.        . ferrous sulfate 325 (65 FE) MG tablet Take 325 mg by mouth daily with breakfast.        . furosemide (LASIX) 20 MG tablet Take 20 mg by mouth daily.        [name] ipratropium-albuterol (DUONEB) 0.5-2.5 (3) MG/3ML SOLN Take 3 mLs by nebulization as directed.        . lansoprazole (PREVACID) 30 MG capsule Take 30 mg by mouth daily.        . methocarbamol (ROBAXIN) 750 MG tablet Take 750 mg by mouth 2 (two) times daily.       . metoprolol (LOPRESSOR) 50 MG tablet 1/2 tablet two times daily      . mometasone (NASONEX) 50 MCG/ACT nasal spray 2 sprays by Nasal route daily.        . Multiple Vitamin (MULTIVITAMIN) tablet Take 1 tablet by mouth daily.        . predniSONE (DELTASONE) 20 MG tablet Take 20 mg by mouth as directed.        . simvastatin (ZOCOR) 40 MG tablet Take 1 tablet (40 mg total) by mouth at bedtime.  90 tablet  3  . terazosin (HYTRIN) 5 MG capsule Take 5 mg by mouth at bedtime.        . trolamine salicylate (ASPERCREME) 10 %  cream Apply topically as needed.         No current facility-administered medications for this visit.     Past Medical History  Diagnosis Date  . History of peptic ulcer disease   . Anemia   . Hyperlipidemia   . Aortic stenosis    . Peripheral vascular disease   . Atrial fibrillation   . Carotid stenosis   . Renal artery stenosis   . CAD (coronary artery disease)   . Hypertension     Past Surgical History  Procedure Laterality Date  . Coronary artery bypass graft  1994  . Renal artery stent      stenting of the left renal artery as well as a cutting balloon angioplasty for treatment of in-stent restenosis coronary artery bypass grafting in 1994.  [name] Knee surgery  08/2003    left knee    History   Social History  . Marital Status: Married    Spouse Name: N/A    Number of Children: N/A  . Years of Education: N/A   Occupational History  . Not on file.   Social History Main Topics  . Smoking status: Former Research scientist (life sciences)  . Smokeless  tobacco: Not on file  . Alcohol Use: Not on file  . Drug Use: No  . Sexually Active: Yes   Other Topics Concern  . Not on file   Social History Narrative   Social History:   Married    Tobacco Use - No.    Alcohol Use - yes         Family History:   Mother died at age 64 of coronary disease.  Father died at 35 of coronary disease.  He has 13 siblings, almost all of whom have coronary disease and some with premature onset.    ROS: pain in ankle from gout but no fevers or chills, productive cough, hemoptysis, dysphasia, odynophagia, melena, hematochezia, dysuria, hematuria, rash, seizure activity, orthopnea, PND, pedal edema, claudication. Remaining systems are negative.  Physical Exam: Well-developed well-nourished in no acute distress.  Skin is warm and dry.  HEENT is normal.  Neck is supple.  Chest is clear to auscultation with normal expansion.  Cardiovascular exam is irregular, 2/6 systolic murmur left sternal border. Abdominal exam nontender or distended. No masses palpated. Extremities show no edema. neuro grossly intact  ECG atrial fibrillation at a rate of 96. Left axis deviation. Nonspecific T-wave changes.

## 2012-10-17 NOTE — Assessment & Plan Note (Signed)
Continue statin.not on aspirin given need for anticoagulation. Schedule Myoview for risk stratification.

## 2012-10-17 NOTE — Patient Instructions (Addendum)
Your physician wants you to follow-up in: ONE YEAR WITH DR CRENSHAW You will receive a reminder letter in the mail two months in advance. If you don't receive a letter, please call our office to schedule the follow-up appointment.   Your physician has requested that you have a lexiscan myoview. For further information please visit [email or web address]. Please follow instruction sheet, as given.   

## 2012-10-17 NOTE — Assessment & Plan Note (Signed)
Continue statin. 

## 2012-10-17 NOTE — Assessment & Plan Note (Signed)
Plan repeat echocardiogram and he returns in one year.

## 2012-10-17 NOTE — Assessment & Plan Note (Signed)
Continue statin. Follow-up carotid Dopplers. 

## 2012-10-17 NOTE — Assessment & Plan Note (Signed)
Continue present blood pressure medications. 

## 2012-10-17 NOTE — Assessment & Plan Note (Signed)
Continue pradaxa and Toprol. Renal function and hemoglobin monitored by primary care.

## 2012-10-31 DIAGNOSIS — J019 Acute sinusitis, unspecified: Secondary | ICD-10-CM | POA: Diagnosis not present

## 2012-10-31 DIAGNOSIS — Z6834 Body mass index (BMI) 34.0-34.9, adult: Secondary | ICD-10-CM | POA: Diagnosis not present

## 2012-10-31 DIAGNOSIS — M109 Gout, unspecified: Secondary | ICD-10-CM | POA: Diagnosis not present

## 2012-10-31 DIAGNOSIS — J209 Acute bronchitis, unspecified: Secondary | ICD-10-CM | POA: Diagnosis not present

## 2012-11-10 ENCOUNTER — Encounter (INDEPENDENT_AMBULATORY_CARE_PROVIDER_SITE_OTHER): Payer: Medicare Other

## 2012-11-10 ENCOUNTER — Telehealth: Payer: Self-pay | Admitting: *Deleted

## 2012-11-10 DIAGNOSIS — I6523 Occlusion and stenosis of bilateral carotid arteries: Secondary | ICD-10-CM

## 2012-11-10 DIAGNOSIS — I6529 Occlusion and stenosis of unspecified carotid artery: Secondary | ICD-10-CM

## 2012-11-10 NOTE — Telephone Encounter (Signed)
DOD Dr Acie Fredrickson reviewed carotid Duplex, needs referral to VVS, order sent to [facility], pt was left a msg that our office will be calling today or tomorrow to set up an appointment for him to see a vascular specialist. Number provided for return questions or concerns.

## 2012-11-11 ENCOUNTER — Encounter: Payer: Self-pay | Admitting: Vascular Surgery

## 2012-11-11 ENCOUNTER — Other Ambulatory Visit: Payer: Self-pay | Admitting: *Deleted

## 2012-11-12 ENCOUNTER — Telehealth: Payer: Self-pay | Admitting: *Deleted

## 2012-11-12 ENCOUNTER — Ambulatory Visit (HOSPITAL_COMMUNITY): Payer: Medicare Other | Attending: Cardiology | Admitting: Radiology

## 2012-11-12 VITALS — BP 135/71 | HR 65 | Ht 72.0 in | Wt 245.0 lb

## 2012-11-12 DIAGNOSIS — I10 Essential (primary) hypertension: Secondary | ICD-10-CM | POA: Diagnosis not present

## 2012-11-12 DIAGNOSIS — I2510 Atherosclerotic heart disease of native coronary artery without angina pectoris: Secondary | ICD-10-CM | POA: Diagnosis not present

## 2012-11-12 DIAGNOSIS — E785 Hyperlipidemia, unspecified: Secondary | ICD-10-CM | POA: Insufficient documentation

## 2012-11-12 DIAGNOSIS — I4891 Unspecified atrial fibrillation: Secondary | ICD-10-CM

## 2012-11-12 DIAGNOSIS — Z951 Presence of aortocoronary bypass graft: Secondary | ICD-10-CM | POA: Insufficient documentation

## 2012-11-12 DIAGNOSIS — Z87891 Personal history of nicotine dependence: Secondary | ICD-10-CM | POA: Insufficient documentation

## 2012-11-12 DIAGNOSIS — E669 Obesity, unspecified: Secondary | ICD-10-CM | POA: Diagnosis not present

## 2012-11-12 DIAGNOSIS — E119 Type 2 diabetes mellitus without complications: Secondary | ICD-10-CM | POA: Diagnosis not present

## 2012-11-12 DIAGNOSIS — I739 Peripheral vascular disease, unspecified: Secondary | ICD-10-CM | POA: Diagnosis not present

## 2012-11-12 DIAGNOSIS — I779 Disorder of arteries and arterioles, unspecified: Secondary | ICD-10-CM | POA: Insufficient documentation

## 2012-11-12 DIAGNOSIS — Z8249 Family history of ischemic heart disease and other diseases of the circulatory system: Secondary | ICD-10-CM | POA: Insufficient documentation

## 2012-11-12 MED ORDER — TECHNETIUM TC 99M SESTAMIBI GENERIC - CARDIOLITE
10.0000 | Freq: Once | INTRAVENOUS | Status: AC | PRN
  Administered 2012-11-12: 10 via INTRAVENOUS

## 2012-11-12 MED ORDER — REGADENOSON 0.4 MG/5ML IV SOLN
0.4000 mg | Freq: Once | INTRAVENOUS | Status: AC
  Administered 2012-11-12: 0.4 mg via INTRAVENOUS

## 2012-11-12 MED ORDER — TECHNETIUM TC 99M SESTAMIBI GENERIC - CARDIOLITE
30.0000 | Freq: Once | INTRAVENOUS | Status: AC | PRN
  Administered 2012-11-12: 30 via INTRAVENOUS

## 2012-11-12 NOTE — Telephone Encounter (Signed)
Message copied by Jonathon Jordan on Wed Nov 12, 2012 11:22 AM ------      Message from: Karlene Einstein      Created: Tue Nov 11, 2012  2:20 PM         appt with Dr. Donnetta Hutching 12/02/12 @ 9:30      ----- Message -----         From: Karlene Einstein         Sent: 11/10/2012   5:03 PM           To: Lynann Beaver, RN            Paper work sent. Waiting on appt.      ----- Message -----         From: Jonathon Jordan, RN         Sent: 11/10/2012   3:28 PM           To: Fredia Beets, RN, Lelon Perla, MD, #            please schedule an app with VVS for carotid consult, [id number] RICA, [id number] LICA, RICA has severely elevated velocities.              ------

## 2012-11-12 NOTE — Progress Notes (Signed)
[facility] [address] [place], [place] [address] [phone]    Cardiology Nuclear Med Study  [name] is a 79 y.o. male     MRN : [id number]     DOB: March 05, 1932  Procedure Date: 11/12/2012  Nuclear Med Background Indication for Stress Test:  Evaluation for Ischemia and Graft Patency History:  '94 CABG; '11 MPS:no ischemia,  ? inferior infarct; '13 Echo:EF=55%, mild AS Cardiac Risk Factors: Carotid Disease, Family History - CAD, History of Smoking, Hypertension, Lipids, NIDDM, Obesity and PVD  Symptoms:  No cardiac symptoms   Nuclear Pre-Procedure Caffeine/Decaff Intake:  None NPO After: 8:00pm   Lungs:  Clear. O2 Sat: 98% on room air. IV 0.9% NS with Angio Cath:  22g  IV Site: R Antecubital  IV Started by:  Crissie Figures, RN  Chest Size (in):  46 Cup Size: n/a  Height: 6' (1.829 m)  Weight:  245 lb (111.131 kg)  BMI:  Body mass index is 33.22 kg/(m^2). Tech Comments:  Patient took all med's except Lasix    Nuclear Med Study 1 or 2 day study: 1 day  Stress Test Type:  Lexiscan  Reading MD: Dola Argyle, MD  Order Authorizing Provider:  Kirk Ruths, MD  Resting Radionuclide: Technetium 35m Sestamibi  Resting Radionuclide Dose: 11.0 mCi   Stress Radionuclide:  Technetium 69m Sestamibi  Stress Radionuclide Dose: 33.0 mCi           Stress Protocol Rest HR: 65 Stress HR: 83  Rest BP: 135/71 Stress BP: 147/73  Exercise Time (min): n/a METS: n/a   Predicted Max HR: 141 bpm % Max HR: 58.87 bpm Rate Pressure Product: 12201   Dose of Adenosine (mg):  n/a Dose of Lexiscan: 0.4 mg  Dose of Atropine (mg): n/a Dose of Dobutamine: n/a mcg/kg/min (at max HR)  Stress Test Technologist: Letta Moynahan, CMA-N  Nuclear Technologist:  Charlton Amor, CNMT     Rest Procedure:  Myocardial perfusion imaging was performed at rest 45 minutes following the intravenous administration of Technetium 19m Sestamibi.  Rest ECG: atrial fibrillation  with decreased anterior R-wave progression  Stress Procedure:  The patient received IV Lexiscan 0.4 mg over 15-seconds.  Technetium 1m Sestamibi injected at 30-seconds.  Patient c/o chest feeling flushed.  Quantitative spect images were obtained after a 45 minute delay.  Stress ECG: No significant change from baseline ECG  QPS Raw Data Images:  Patient motion noted; appropriate software correction applied. Stress Images:  Normal homogeneous uptake in all areas of the myocardium. Rest Images:  Normal homogeneous uptake in all areas of the myocardium. Subtraction (SDS):  No evidence of ischemia. Transient Ischemic Dilatation (Normal <1.22):  1.00 Lung/Heart Ratio (Normal <0.45):  0.37  Quantitative Gated Spect Images QGS EDV:  118 ml QGS ESV:  58 ml  Impression Exercise Capacity:  Lexiscan with no exercise. BP Response:  Normal blood pressure response. Clinical Symptoms:  Patient felt flushed in the chest ECG Impression:  No significant ST segment change suggestive of ischemia. Comparison with Prior Nuclear Study: No significant change from previous study  Overall Impression:  There is no definite scar or ischemia. There is slight decreased activity in the inferior wall that may be from attenuation. The wall motion abnormality is probably related to prior CABG. This is a low risk scan.  LV Ejection Fraction: 50%.  LV Wall Motion:  There is mild dyssynergy of the septum (CABG).  Dola Argyle, MD

## 2012-11-12 NOTE — Telephone Encounter (Signed)
Noted  

## 2012-11-17 ENCOUNTER — Other Ambulatory Visit (INDEPENDENT_AMBULATORY_CARE_PROVIDER_SITE_OTHER): Payer: Medicare Other

## 2012-11-17 DIAGNOSIS — E7800 Pure hypercholesterolemia, unspecified: Secondary | ICD-10-CM

## 2012-11-17 LAB — HEPATIC FUNCTION PANEL
AST: 19 U/L (ref 0–37)
Alkaline Phosphatase: 53 U/L (ref 39–117)
Bilirubin, Direct: 0.1 mg/dL (ref 0.0–0.3)

## 2012-11-17 LAB — LIPID PANEL
LDL Cholesterol: 77 mg/dL (ref 0–99)
Total CHOL/HDL Ratio: 3

## 2012-11-26 ENCOUNTER — Encounter: Payer: Self-pay | Admitting: *Deleted

## 2012-11-27 ENCOUNTER — Other Ambulatory Visit: Payer: Self-pay | Admitting: *Deleted

## 2012-11-27 DIAGNOSIS — R9439 Abnormal result of other cardiovascular function study: Secondary | ICD-10-CM

## 2012-11-28 DIAGNOSIS — Z6834 Body mass index (BMI) 34.0-34.9, adult: Secondary | ICD-10-CM | POA: Diagnosis not present

## 2012-11-28 DIAGNOSIS — R42 Dizziness and giddiness: Secondary | ICD-10-CM | POA: Diagnosis not present

## 2012-11-28 DIAGNOSIS — H6690 Otitis media, unspecified, unspecified ear: Secondary | ICD-10-CM | POA: Diagnosis not present

## 2012-12-01 ENCOUNTER — Encounter: Payer: Self-pay | Admitting: Vascular Surgery

## 2012-12-02 ENCOUNTER — Encounter: Payer: Self-pay | Admitting: Vascular Surgery

## 2012-12-02 ENCOUNTER — Other Ambulatory Visit (INDEPENDENT_AMBULATORY_CARE_PROVIDER_SITE_OTHER): Payer: Medicare Other | Admitting: *Deleted

## 2012-12-02 ENCOUNTER — Encounter: Payer: Self-pay | Admitting: *Deleted

## 2012-12-02 ENCOUNTER — Ambulatory Visit (INDEPENDENT_AMBULATORY_CARE_PROVIDER_SITE_OTHER): Payer: Medicare Other | Admitting: Vascular Surgery

## 2012-12-02 DIAGNOSIS — I6529 Occlusion and stenosis of unspecified carotid artery: Secondary | ICD-10-CM | POA: Diagnosis not present

## 2012-12-02 NOTE — Progress Notes (Signed)
[facility] [place]   Patient name: [name] MRN: [id number] DOB: [birth year] Sex: male   Referred by: Stanford Breed  Reason for referral:  Chief Complaint  Patient presents with  . New Evaluation    referred by Dr. Stanford Breed    . Carotid    HISTORY OF PRESENT ILLNESS: The patient is a 79 year old gentleman with a long history of asymptomatic carotid stenosis. This is been followed with serial Dopplers for nearly 20 years. He has always maintain a moderate to severe level of stenoses bilaterally. Recently he had a repeat study and this did show progression to severe stenosis in the right internal carotid artery. He is right-handed. He specifically denies any history of amaurosis fugax, transient ischemic attack or stroke. He does have a significant past cardiac history with prior coronary bypass grafting in 1994. He recently underwent a Myoview which showed no evidence of myocardial ischemia but did show questionable thyroid lesion and he is to have further evaluation of this on July 21. He does have multiple medical problems to include hyperlipidemia aortic stenosis atrial fibrillation on Pradaxa and hypertension  Past Medical History  Diagnosis Date  . History of peptic ulcer disease   . Anemia   . Hyperlipidemia   . Aortic stenosis    . Peripheral vascular disease   . Atrial fibrillation   . Carotid stenosis   . Renal artery stenosis   . CAD (coronary artery disease)   . Hypertension     Past Surgical History  Procedure Laterality Date  . Coronary artery bypass graft  1994  . Renal artery stent      stenting of the left renal artery as well as a cutting balloon angioplasty for treatment of in-stent restenosis coronary artery bypass grafting in 1994.  [name] Knee surgery  08/2003    left knee  . Removal of bleeding ulcer  1994    History   Social History  . Marital Status: Married    Spouse Name: N/A    Number of Children: N/A  . Years of Education:  N/A   Occupational History  . Not on file.   Social History Main Topics  . Smoking status: Former Smoker    Quit date: 04/27/1993  . Smokeless tobacco: Not on file  . Alcohol Use: No  . Drug Use: No  . Sexually Active: Yes   Other Topics Concern  . Not on file   Social History Narrative   Social History:   Married    Tobacco Use - No.    Alcohol Use - yes         Family History:   Mother died at age 82 of coronary disease.  Father died at 41 of coronary disease.  He has 13 siblings, almost all of whom have coronary disease and some with premature onset.    Family History  Problem Relation Age of Onset  . Coronary artery disease Mother   . Heart disease Mother   . Coronary artery disease Father   . Heart disease Father   . Coronary artery disease      13 sibling, almost all have coronary disease and some with premature onset  . Heart disease      13 sibling, almost all have coronary disease and some with premature onset    Allergies as of 12/02/2012 - Review Complete 12/02/2012  Allergen Reaction Noted  . Clarithromycin  09/12/2010    Current Outpatient Prescriptions on File Prior to Visit  Medication Sig Dispense Refill  . acetaminophen (TYLENOL) 500 MG tablet Take 500 mg by mouth every 6 (six) hours as needed.        [name] acetaminophen-codeine (TYLENOL #2) 300-15 MG per tablet Take 1 tablet by mouth as directed.       [name] acyclovir (ZOVIRAX) 800 MG tablet 1/2 tab po bid       . albuterol (PROVENTIL,VENTOLIN) 90 MCG/ACT inhaler Inhale 2 puffs into the lungs 4 (four) times daily.        [name] allopurinol (ZYLOPRIM) 300 MG tablet Take 300 mg by mouth daily.        . benazepril (LOTENSIN) 20 MG tablet Take 20 mg by mouth daily.        . budesonide-formoterol (SYMBICORT) 160-4.5 MCG/ACT inhaler Inhale 2 puffs into the lungs 2 (two) times daily.        [name] buPROPion (WELLBUTRIN SR) 150 MG 12 hr tablet Take 150 mg by mouth daily.       . citalopram (CELEXA) 40 MG tablet Take by  mouth daily. 1/2 tablet daily       . dabigatran (PRADAXA) 150 MG CAPS Take 150 mg by mouth every 12 (twelve) hours.        . ferrous sulfate 325 (65 FE) MG tablet Take 325 mg by mouth daily with breakfast.        . furosemide (LASIX) 20 MG tablet Take 20 mg by mouth daily.        [name] ipratropium-albuterol (DUONEB) 0.5-2.5 (3) MG/3ML SOLN Take 3 mLs by nebulization as directed.        . lansoprazole (PREVACID) 30 MG capsule Take 30 mg by mouth daily.        . methocarbamol (ROBAXIN) 750 MG tablet Take 750 mg by mouth 2 (two) times daily.       . metoprolol (LOPRESSOR) 50 MG tablet 1/2 tablet two times daily      . mometasone (NASONEX) 50 MCG/ACT nasal spray 2 sprays by Nasal route daily.        . Multiple Vitamin (MULTIVITAMIN) tablet Take 1 tablet by mouth daily.        . predniSONE (DELTASONE) 20 MG tablet Take 20 mg by mouth as directed.        . simvastatin (ZOCOR) 40 MG tablet Take 1 tablet (40 mg total) by mouth at bedtime.  90 tablet  3  . terazosin (HYTRIN) 5 MG capsule Take 5 mg by mouth at bedtime.        . trolamine salicylate (ASPERCREME) 10 % cream Apply topically as needed.         No current facility-administered medications on file prior to visit.     REVIEW OF SYSTEMS:  Positives indicated with an "X"  CARDIOVASCULAR:  [ ]  chest pain   [ ]  chest pressure   [ ]  palpitations   [ ]  orthopnea   [ ]  dyspnea on exertion   [ ]  claudication   [ ]  rest pain   [ ]  DVT   [ ]  phlebitis PULMONARY:   [ ]  productive cough   [ ]  asthma   [ ]  wheezing NEUROLOGIC:   [ ]  weakness  [ ]  paresthesias  [ ]  aphasia  [ ]  amaurosis  [ ]  dizziness HEMATOLOGIC:   [ ]  bleeding problems   [ ]  clotting disorders MUSCULOSKELETAL:  [ ]  joint pain   [ ]  joint swelling GASTROINTESTINAL: [ ]   blood in stool  [ ]   hematemesis  GENITOURINARY:  [ ]   dysuria  [ ]   hematuria PSYCHIATRIC:  [ ]  history of major depression INTEGUMENTARY:  [ ]  rashes  [ ]  ulcers CONSTITUTIONAL:  [ ]  fever   [ ]  chills  PHYSICAL  EXAMINATION:  General: The patient is a well-nourished male, in no acute distress. Vital signs are BP 119/67  Pulse 72  Resp 18  Ht 6' (1.829 m)  Wt 245 lb 14.4 oz (111.54 kg)  BMI 33.34 kg/m2 Pulmonary: There is a good air exchange bilaterally without wheezing or rales. Abdomen: Soft and non-tender with normal pitch bowel sounds. Musculoskeletal: There are no major deformities.  There is no significant extremity pain. Neurologic: No focal weakness or paresthesias are detected, Skin: There are no ulcer or rashes noted. Psychiatric: The patient has normal affect. Cardiovascular: There is a regular rate and rhythm. He does have a systolic murmur Carotid artery with a bruit in his right neck and no bruit on the left 2+ radial pulses bilaterally    [facility] Vascular Lab Studies:  Ordered and Independently Reviewed . He did have a repeat right carotid imaging to confirm that he is a candidate for endarterectomy based on duplex and that he does have a high-grade stenosis. This is the case and he does have focal stenosis at the bifurcation with normal internal carotid artery above this.   Impression and Plan:  Severe asymptomatic right internal carotid artery stenosis. Had a long discussion with his patent with the patient and his wife present. I explained the risks of the high-grade asymptomatic carotid stenosis and also the is for surgery. I explained pressure 1% risk of stroke with surgery. Also explained low incidence of cranial nerve injury. I would recommend endarterectomy based on the duplex. I have recommended that we await until he has further thyroid imaging studies to assure that he does not require thyroid surgery which are be unlikely. We have tentatively scheduled surgery for August 4. He understands this would be one night admission discharge to home on postoperative day 1.    Magdiel Bartles [facility] [place] Office: [phone]

## 2012-12-03 ENCOUNTER — Telehealth: Payer: Self-pay | Admitting: *Deleted

## 2012-12-03 ENCOUNTER — Other Ambulatory Visit: Payer: Self-pay | Admitting: *Deleted

## 2012-12-03 NOTE — Telephone Encounter (Signed)
Message copied by Alfonso Patten on Wed Dec 03, 2012 10:26 AM ------      Message from: Lelon Perla      Created: Tue Dec 02, 2012  5:12 PM       He is on pradaxa not plavix; DC 2 days prior to procedure and resume after when ok with surgery      Kirk Ruths            ----- Message -----         From: Alfonso Patten, RN         Sent: 12/02/2012   4:25 PM           To: Sherrye Payor, RN, Fredia Beets, RN, #            Dr Curt Jews is planning on doing a Right Carotid Endarterectomy on 01/05/13. May we stop his Plavix for 5 days prior?            Thanks            Denita Lung      [facility] Clinical team leader       ------

## 2012-12-08 DIAGNOSIS — R197 Diarrhea, unspecified: Secondary | ICD-10-CM | POA: Diagnosis not present

## 2012-12-17 ENCOUNTER — Encounter (HOSPITAL_COMMUNITY): Payer: Self-pay | Admitting: Pharmacy Technician

## 2012-12-22 ENCOUNTER — Ambulatory Visit
Admission: RE | Admit: 2012-12-22 | Discharge: 2012-12-22 | Disposition: A | Discharge status: Home / Self Care | Payer: Medicare Other | Source: Ambulatory Visit | Attending: Cardiology | Admitting: Cardiology

## 2012-12-22 DIAGNOSIS — R9439 Abnormal result of other cardiovascular function study: Secondary | ICD-10-CM

## 2012-12-22 DIAGNOSIS — E042 Nontoxic multinodular goiter: Secondary | ICD-10-CM | POA: Diagnosis not present

## 2012-12-23 ENCOUNTER — Encounter (HOSPITAL_COMMUNITY)
Admission: RE | Admit: 2012-12-23 | Discharge: 2012-12-23 | Disposition: A | Discharge status: Home / Self Care | Payer: Medicare Other | Source: Ambulatory Visit | Attending: Vascular Surgery | Admitting: Vascular Surgery

## 2012-12-23 ENCOUNTER — Other Ambulatory Visit: Payer: Self-pay | Admitting: *Deleted

## 2012-12-23 ENCOUNTER — Ambulatory Visit (HOSPITAL_COMMUNITY)
Admission: RE | Admit: 2012-12-23 | Discharge: 2012-12-23 | Disposition: A | Discharge status: Home / Self Care | Payer: Medicare Other | Source: Ambulatory Visit | Attending: Anesthesiology | Admitting: Anesthesiology

## 2012-12-23 ENCOUNTER — Encounter (HOSPITAL_COMMUNITY): Payer: Self-pay

## 2012-12-23 DIAGNOSIS — E119 Type 2 diabetes mellitus without complications: Secondary | ICD-10-CM | POA: Insufficient documentation

## 2012-12-23 DIAGNOSIS — E785 Hyperlipidemia, unspecified: Secondary | ICD-10-CM | POA: Insufficient documentation

## 2012-12-23 DIAGNOSIS — I10 Essential (primary) hypertension: Secondary | ICD-10-CM | POA: Diagnosis not present

## 2012-12-23 DIAGNOSIS — Z951 Presence of aortocoronary bypass graft: Secondary | ICD-10-CM | POA: Insufficient documentation

## 2012-12-23 DIAGNOSIS — Z0183 Encounter for blood typing: Secondary | ICD-10-CM | POA: Insufficient documentation

## 2012-12-23 DIAGNOSIS — Z01812 Encounter for preprocedural laboratory examination: Secondary | ICD-10-CM | POA: Diagnosis not present

## 2012-12-23 DIAGNOSIS — I4891 Unspecified atrial fibrillation: Secondary | ICD-10-CM | POA: Insufficient documentation

## 2012-12-23 DIAGNOSIS — G4733 Obstructive sleep apnea (adult) (pediatric): Secondary | ICD-10-CM | POA: Diagnosis not present

## 2012-12-23 DIAGNOSIS — Z01811 Encounter for preprocedural respiratory examination: Secondary | ICD-10-CM | POA: Diagnosis not present

## 2012-12-23 DIAGNOSIS — E669 Obesity, unspecified: Secondary | ICD-10-CM | POA: Insufficient documentation

## 2012-12-23 DIAGNOSIS — I517 Cardiomegaly: Secondary | ICD-10-CM | POA: Diagnosis not present

## 2012-12-23 DIAGNOSIS — J449 Chronic obstructive pulmonary disease, unspecified: Secondary | ICD-10-CM | POA: Diagnosis not present

## 2012-12-23 DIAGNOSIS — E041 Nontoxic single thyroid nodule: Secondary | ICD-10-CM

## 2012-12-23 DIAGNOSIS — Z01818 Encounter for other preprocedural examination: Secondary | ICD-10-CM | POA: Insufficient documentation

## 2012-12-23 DIAGNOSIS — J4489 Other specified chronic obstructive pulmonary disease: Secondary | ICD-10-CM | POA: Insufficient documentation

## 2012-12-23 HISTORY — DX: Major depressive disorder, single episode, unspecified: F32.9

## 2012-12-23 HISTORY — DX: Gastro-esophageal reflux disease without esophagitis: K21.9

## 2012-12-23 HISTORY — DX: Chronic obstructive pulmonary disease, unspecified: J44.9

## 2012-12-23 HISTORY — DX: Sleep apnea, unspecified: G47.30

## 2012-12-23 HISTORY — DX: Anxiety disorder, unspecified: F41.9

## 2012-12-23 HISTORY — DX: Personal history of other medical treatment: Z92.89

## 2012-12-23 HISTORY — DX: Acute myocardial infarction, unspecified: I21.9

## 2012-12-23 HISTORY — DX: Pneumonia, unspecified organism: J18.9

## 2012-12-23 HISTORY — DX: Type 2 diabetes mellitus without complications: E11.9

## 2012-12-23 HISTORY — DX: Depression, unspecified: F32.A

## 2012-12-23 LAB — COMPREHENSIVE METABOLIC PANEL
ALT: 26 U/L (ref 0–53)
Albumin: 3.9 g/dL (ref 3.5–5.2)
Alkaline Phosphatase: 66 U/L (ref 39–117)
Chloride: 101 mEq/L (ref 96–112)
Glucose, Bld: 121 mg/dL — ABNORMAL HIGH (ref 70–99)
Potassium: 4.1 mEq/L (ref 3.5–5.1)
Sodium: 138 mEq/L (ref 135–145)
Total Bilirubin: 0.5 mg/dL (ref 0.3–1.2)
Total Protein: 6.7 g/dL (ref 6.0–8.3)

## 2012-12-23 LAB — URINALYSIS, ROUTINE W REFLEX MICROSCOPIC
Leukocytes, UA: NEGATIVE
Nitrite: NEGATIVE
Specific Gravity, Urine: 1.009 (ref 1.005–1.030)
Urobilinogen, UA: 0.2 mg/dL (ref 0.0–1.0)
pH: 6.5 (ref 5.0–8.0)

## 2012-12-23 LAB — CBC
HCT: 43.8 % (ref 39.0–52.0)
Hemoglobin: 14.7 g/dL (ref 13.0–17.0)
MCHC: 33.6 g/dL (ref 30.0–36.0)
RDW: 14.7 % (ref 11.5–15.5)
WBC: 9.4 10*3/uL (ref 4.0–10.5)

## 2012-12-23 LAB — TYPE AND SCREEN: Antibody Screen: NEGATIVE

## 2012-12-23 LAB — SURGICAL PCR SCREEN: Staphylococcus aureus: NEGATIVE

## 2012-12-23 LAB — ABO/RH: ABO/RH(D): O POS

## 2012-12-23 LAB — APTT: aPTT: 50 seconds — ABNORMAL HIGH (ref 24–37)

## 2012-12-23 NOTE — Progress Notes (Addendum)
Anesthesia Chart Review:  Patient is a 79 year old male scheduled for right CEA on 01/05/13 by Dr. Donnetta Hutching.  History includes former smoker, COPD with night time O2 @ 2L, obesity, mild AS by 11/2011 echo, CAD/MI s/p CABG '94, afib, PAD, renal artery stenosis s/p left renal artery stent '06, HTN, HLD, DM2, OSA, GERD, PUD, anemia, anxiety, depression, on-going evaluation for multi-nodular goiter with dominant right lower pole nodule. PCP is listed as Dr. Nelda Bucks.  Cardiologist is Dr. Kirk Ruths who referred patient to Dr. Donnetta Hutching for evaluation/treatment of high grade RICA stenosis.    Nuclear stress test on 11/13/12 showed: There is no definite scar or ischemia. There is slight decreased activity in the inferior wall that may be from attenuation. The wall motion abnormality is probably related to prior CABG. This is a low risk scan. LV Ejection Fraction: 50%. LV Wall Motion: There is mild dyssynergy of the septum (CABG).  Echo on 11/09/11 showed: - Left ventricle: The cavity size was normal. Wall thickness was increased in a pattern of mild LVH. Systolic function was normal. The estimated ejection fraction was in the range of 50% to 55%. - Aortic valve: AV is thickened, calcified with mildly restricted motion Peak and mean gradients through the valve are 23 and12 mm Hg respectively consistent with mild AS. Mild regurgitation. - Mitral valve: Mild regurgitation. - Left atrium: The atrium was moderately dilated. - Tricuspid valve: Mild regurgitation. - Pulmonary arteries: PA peak pressure: 60mm Hg (S).  EKG on 10/17/12 showed afib @ 96 bpm, LAD, cannot rule out anterior infarct (age undetermined).    Carotid duplex on 11/11/12 showed [id number] RICAS, [id number] LICAS, antegrade vertebral artery flow.  Abnormal right thyroid appearance was also noted.  He has since underwent a thyroid ultrasound on 12/22/12 that showed: Multi nodular goiter. The dominant nodule in the right lower pole has micro calcifications  and subtle peripheral nodularity. This nodule fits criteria for fine needle aspiration biopsy if not previously assessed.  Dr. Stanford Breed has ordered a FNA with IR which I believe is scheduled for 12/25/12.  By notes, it appears Dr. Donnetta Hutching was also allowing time for further thyroid imaging before surgery to see if any thyroid surgery would be required. (Update: FNA cytology report showed findings consistent with a benign follicular nodule.)  CXR on 12/23/12 showed cardiomegaly without acute disease.  Preoperative labs noted.  PT/INR WNL.  PTT is 50.  Per Dr. Stanford Breed, he can hold Pradaxa two days prior to procedure and resume when okay from a surgery standpoint.  Will repeat PTT on arrival as he will be off Pradaxa at that point.  George Hugh [facility]Anesthesiology Phone [phone] 12/24/2012 1:16 PM

## 2012-12-23 NOTE — Progress Notes (Signed)
Primary Physician - Dr. Delena Bali Cardiologist - [place] Stress 11/2012 epic ekg in epic from may

## 2012-12-23 NOTE — Pre-Procedure Instructions (Signed)
[name]  12/23/2012   Your procedure is scheduled on:  Monday, August 4th  Report to [place] at [facility].  Call this number if you have problems the morning of surgery: [phone]   Remember:   Do not eat food or drink liquids after midnight.   Take these medicines the morning of surgery with A SIP OF WATER: wellbutrin if needed, celexa, prevacid, lopressor, inhalers if needed, tylenol if needed  Stop taking pradaxa 2 days prior to surgery.   Do not wear jewelry.  Do not wear lotions, powders, or perfume,deodorant.  Do not shave 48 hours prior to surgery. Men may shave face and neck.  Do not bring valuables to the hospital.  [facility] is not responsible for any belongings or valuables.  Contacts, dentures or bridgework may not be worn into surgery.  Leave suitcase in the car. After surgery it may be brought to your room.  For patients admitted to the hospital, checkout time is 11:00 AM the day of discharge.   Special Instructions: Shower using CHG 2 nights before surgery and the night before surgery.  If you shower the day of surgery use CHG.  Use special wash - you have one bottle of CHG for all showers.  You should use approximately 1/3 of the bottle for each shower.   Please read over the following fact sheets that you were given: Pain Booklet, Coughing and Deep Breathing, Blood Transfusion Information, MRSA Information and Surgical Site Infection Prevention

## 2012-12-25 ENCOUNTER — Ambulatory Visit
Admission: RE | Admit: 2012-12-25 | Discharge: 2012-12-25 | Disposition: A | Discharge status: Home / Self Care | Payer: Medicare Other | Source: Ambulatory Visit | Attending: Cardiology | Admitting: Cardiology

## 2012-12-25 ENCOUNTER — Other Ambulatory Visit (HOSPITAL_COMMUNITY)
Admission: RE | Admit: 2012-12-25 | Discharge: 2012-12-25 | Disposition: A | Discharge status: Home / Self Care | Payer: Medicare Other | Source: Ambulatory Visit | Attending: Cardiology | Admitting: Cardiology

## 2012-12-25 DIAGNOSIS — E049 Nontoxic goiter, unspecified: Secondary | ICD-10-CM | POA: Diagnosis not present

## 2012-12-25 DIAGNOSIS — E041 Nontoxic single thyroid nodule: Secondary | ICD-10-CM

## 2012-12-29 ENCOUNTER — Encounter: Payer: Self-pay | Admitting: Cardiology

## 2012-12-29 DIAGNOSIS — Z6833 Body mass index (BMI) 33.0-33.9, adult: Secondary | ICD-10-CM | POA: Diagnosis not present

## 2012-12-29 DIAGNOSIS — E785 Hyperlipidemia, unspecified: Secondary | ICD-10-CM | POA: Diagnosis not present

## 2012-12-29 DIAGNOSIS — I4891 Unspecified atrial fibrillation: Secondary | ICD-10-CM | POA: Diagnosis not present

## 2012-12-29 DIAGNOSIS — I2510 Atherosclerotic heart disease of native coronary artery without angina pectoris: Secondary | ICD-10-CM | POA: Diagnosis not present

## 2012-12-29 DIAGNOSIS — E119 Type 2 diabetes mellitus without complications: Secondary | ICD-10-CM | POA: Diagnosis not present

## 2012-12-29 DIAGNOSIS — D509 Iron deficiency anemia, unspecified: Secondary | ICD-10-CM | POA: Diagnosis not present

## 2013-01-04 MED ORDER — SODIUM CHLORIDE 0.9 % IV SOLN: INTRAVENOUS | Status: DC

## 2013-01-04 MED ORDER — DEXTROSE 5 % IV SOLN
1.5000 g | INTRAVENOUS | Status: AC
  Administered 2013-01-05: 1.5 g via INTRAVENOUS
  Filled 2013-01-04: qty 1.5

## 2013-01-05 ENCOUNTER — Inpatient Hospital Stay (HOSPITAL_COMMUNITY): Payer: Medicare Other | Admitting: Anesthesiology

## 2013-01-05 ENCOUNTER — Encounter (HOSPITAL_COMMUNITY)
Admission: RE | Disposition: A | Discharge status: Home / Self Care | Payer: Self-pay | Source: Ambulatory Visit | Attending: Vascular Surgery

## 2013-01-05 ENCOUNTER — Encounter (HOSPITAL_COMMUNITY): Payer: Self-pay | Admitting: Anesthesiology

## 2013-01-05 ENCOUNTER — Encounter (HOSPITAL_COMMUNITY): Payer: Self-pay | Admitting: Vascular Surgery

## 2013-01-05 ENCOUNTER — Inpatient Hospital Stay (HOSPITAL_COMMUNITY)
Admission: RE | Admit: 2013-01-05 | Discharge: 2013-01-06 | DRG: 039 | Disposition: A | Discharge status: Home / Self Care | Payer: Medicare Other | Source: Ambulatory Visit | Attending: Vascular Surgery | Admitting: Vascular Surgery

## 2013-01-05 DIAGNOSIS — F411 Generalized anxiety disorder: Secondary | ICD-10-CM | POA: Diagnosis present

## 2013-01-05 DIAGNOSIS — Z951 Presence of aortocoronary bypass graft: Secondary | ICD-10-CM | POA: Diagnosis not present

## 2013-01-05 DIAGNOSIS — G4730 Sleep apnea, unspecified: Secondary | ICD-10-CM | POA: Diagnosis present

## 2013-01-05 DIAGNOSIS — J4489 Other specified chronic obstructive pulmonary disease: Secondary | ICD-10-CM | POA: Diagnosis not present

## 2013-01-05 DIAGNOSIS — I359 Nonrheumatic aortic valve disorder, unspecified: Secondary | ICD-10-CM | POA: Diagnosis present

## 2013-01-05 DIAGNOSIS — I6529 Occlusion and stenosis of unspecified carotid artery: Secondary | ICD-10-CM | POA: Diagnosis not present

## 2013-01-05 DIAGNOSIS — Z8711 Personal history of peptic ulcer disease: Secondary | ICD-10-CM | POA: Diagnosis not present

## 2013-01-05 DIAGNOSIS — N400 Enlarged prostate without lower urinary tract symptoms: Secondary | ICD-10-CM | POA: Diagnosis present

## 2013-01-05 DIAGNOSIS — E785 Hyperlipidemia, unspecified: Secondary | ICD-10-CM | POA: Diagnosis present

## 2013-01-05 DIAGNOSIS — J449 Chronic obstructive pulmonary disease, unspecified: Secondary | ICD-10-CM | POA: Diagnosis present

## 2013-01-05 DIAGNOSIS — F329 Major depressive disorder, single episode, unspecified: Secondary | ICD-10-CM | POA: Diagnosis present

## 2013-01-05 DIAGNOSIS — Z8249 Family history of ischemic heart disease and other diseases of the circulatory system: Secondary | ICD-10-CM | POA: Diagnosis not present

## 2013-01-05 DIAGNOSIS — I10 Essential (primary) hypertension: Secondary | ICD-10-CM | POA: Diagnosis present

## 2013-01-05 DIAGNOSIS — IMO0002 Reserved for concepts with insufficient information to code with codable children: Secondary | ICD-10-CM | POA: Diagnosis not present

## 2013-01-05 DIAGNOSIS — I2510 Atherosclerotic heart disease of native coronary artery without angina pectoris: Secondary | ICD-10-CM | POA: Diagnosis present

## 2013-01-05 DIAGNOSIS — I252 Old myocardial infarction: Secondary | ICD-10-CM

## 2013-01-05 DIAGNOSIS — K219 Gastro-esophageal reflux disease without esophagitis: Secondary | ICD-10-CM | POA: Diagnosis not present

## 2013-01-05 DIAGNOSIS — Z79899 Other long term (current) drug therapy: Secondary | ICD-10-CM

## 2013-01-05 DIAGNOSIS — I739 Peripheral vascular disease, unspecified: Secondary | ICD-10-CM | POA: Diagnosis present

## 2013-01-05 DIAGNOSIS — Z7901 Long term (current) use of anticoagulants: Secondary | ICD-10-CM

## 2013-01-05 DIAGNOSIS — Z87891 Personal history of nicotine dependence: Secondary | ICD-10-CM

## 2013-01-05 DIAGNOSIS — I4891 Unspecified atrial fibrillation: Secondary | ICD-10-CM | POA: Diagnosis present

## 2013-01-05 DIAGNOSIS — F3289 Other specified depressive episodes: Secondary | ICD-10-CM | POA: Diagnosis present

## 2013-01-05 DIAGNOSIS — E119 Type 2 diabetes mellitus without complications: Secondary | ICD-10-CM | POA: Diagnosis not present

## 2013-01-05 HISTORY — PX: ENDARTERECTOMY: SHX5162

## 2013-01-05 HISTORY — PX: PATCH ANGIOPLASTY: SHX6230

## 2013-01-05 LAB — APTT: aPTT: 32 seconds (ref 24–37)

## 2013-01-05 LAB — GLUCOSE, CAPILLARY: Glucose-Capillary: 111 mg/dL — ABNORMAL HIGH (ref 70–99)

## 2013-01-05 SURGERY — ENDARTERECTOMY, CAROTID
Anesthesia: General | Site: Neck | Laterality: Right | Wound class: Clean

## 2013-01-05 MED ORDER — BENAZEPRIL HCL 10 MG PO TABS
10.0000 mg | ORAL_TABLET | Freq: Every day | ORAL | Status: DC
  Administered 2013-01-05: 10 mg via ORAL
  Filled 2013-01-05 (×2): qty 1

## 2013-01-05 MED ORDER — ROCURONIUM BROMIDE 100 MG/10ML IV SOLN
INTRAVENOUS | Status: DC | PRN
  Administered 2013-01-05: 50 mg via INTRAVENOUS

## 2013-01-05 MED ORDER — BUDESONIDE-FORMOTEROL FUMARATE 160-4.5 MCG/ACT IN AERO
2.0000 | INHALATION_SPRAY | Freq: Every day | RESPIRATORY_TRACT | Status: DC | PRN
  Filled 2013-01-05: qty 6

## 2013-01-05 MED ORDER — ADULT MULTIVITAMIN W/MINERALS CH
1.0000 | ORAL_TABLET | Freq: Every day | ORAL | Status: DC
  Administered 2013-01-05 – 2013-01-06 (×2): 1 via ORAL
  Filled 2013-01-05 (×2): qty 1

## 2013-01-05 MED ORDER — POTASSIUM CHLORIDE CRYS ER 20 MEQ PO TBCR
20.0000 meq | EXTENDED_RELEASE_TABLET | Freq: Once | ORAL | Status: AC | PRN
Start: 2013-01-05 — End: 2013-01-05

## 2013-01-05 MED ORDER — OXYCODONE HCL 5 MG PO TABS
5.0000 mg | ORAL_TABLET | Freq: Once | ORAL | Status: AC | PRN
  Administered 2013-01-05: 5 mg via ORAL

## 2013-01-05 MED ORDER — ACYCLOVIR 400 MG PO TABS
400.0000 mg | ORAL_TABLET | Freq: Two times a day (BID) | ORAL | Status: DC
  Filled 2013-01-05: qty 1

## 2013-01-05 MED ORDER — ONDANSETRON HCL 4 MG/2ML IJ SOLN
4.0000 mg | Freq: Four times a day (QID) | INTRAMUSCULAR | Status: DC | PRN
  Administered 2013-01-06: 4 mg via INTRAVENOUS
  Filled 2013-01-05: qty 2

## 2013-01-05 MED ORDER — ALBUTEROL SULFATE HFA 108 (90 BASE) MCG/ACT IN AERS
2.0000 | INHALATION_SPRAY | Freq: Two times a day (BID) | RESPIRATORY_TRACT | Status: DC
  Filled 2013-01-05: qty 6.7

## 2013-01-05 MED ORDER — OXYCODONE HCL 5 MG PO TABS
ORAL_TABLET | ORAL | Status: AC
  Filled 2013-01-05: qty 1

## 2013-01-05 MED ORDER — OCUVITE-LUTEIN PO CAPS
1.0000 | ORAL_CAPSULE | Freq: Every day | ORAL | Status: DC
  Administered 2013-01-05: 1 via ORAL
  Filled 2013-01-05 (×2): qty 1

## 2013-01-05 MED ORDER — DOPAMINE-DEXTROSE 3.2-5 MG/ML-% IV SOLN: 3.0000 ug/kg/min | INTRAVENOUS | Status: DC

## 2013-01-05 MED ORDER — PHENOL 1.4 % MT LIQD: 1.0000 | OROMUCOSAL | Status: DC | PRN

## 2013-01-05 MED ORDER — FENTANYL CITRATE 0.05 MG/ML IJ SOLN
INTRAMUSCULAR | Status: AC
  Filled 2013-01-05: qty 2

## 2013-01-05 MED ORDER — SODIUM CHLORIDE 0.9 % IV SOLN
10.0000 mg | INTRAVENOUS | Status: DC | PRN
  Administered 2013-01-05: 10 ug/min via INTRAVENOUS

## 2013-01-05 MED ORDER — NEOSTIGMINE METHYLSULFATE 1 MG/ML IJ SOLN
INTRAMUSCULAR | Status: DC | PRN
  Administered 2013-01-05: 3 mg via INTRAVENOUS

## 2013-01-05 MED ORDER — ALLOPURINOL 300 MG PO TABS
300.0000 mg | ORAL_TABLET | Freq: Every day | ORAL | Status: DC
  Administered 2013-01-05 – 2013-01-06 (×2): 300 mg via ORAL
  Filled 2013-01-05 (×2): qty 1

## 2013-01-05 MED ORDER — DOCUSATE SODIUM 100 MG PO CAPS
100.0000 mg | ORAL_CAPSULE | Freq: Every day | ORAL | Status: DC
  Administered 2013-01-06: 100 mg via ORAL
  Filled 2013-01-05: qty 1

## 2013-01-05 MED ORDER — GLYCOPYRROLATE 0.2 MG/ML IJ SOLN
INTRAMUSCULAR | Status: DC | PRN
  Administered 2013-01-05: 0.2 mg via INTRAVENOUS
  Administered 2013-01-05: 0.4 mg via INTRAVENOUS

## 2013-01-05 MED ORDER — FENTANYL CITRATE 0.05 MG/ML IJ SOLN
INTRAMUSCULAR | Status: AC
  Administered 2013-01-05: 25 ug via INTRAVENOUS
  Filled 2013-01-05: qty 2

## 2013-01-05 MED ORDER — ASPIRIN EC 325 MG PO TBEC
325.0000 mg | DELAYED_RELEASE_TABLET | Freq: Every day | ORAL | Status: DC
  Administered 2013-01-06: 325 mg via ORAL
  Filled 2013-01-05: qty 1

## 2013-01-05 MED ORDER — GUAIFENESIN-DM 100-10 MG/5ML PO SYRP: 15.0000 mL | ORAL_SOLUTION | ORAL | Status: DC | PRN

## 2013-01-05 MED ORDER — LIDOCAINE HCL (PF) 1 % IJ SOLN
INTRAMUSCULAR | Status: DC | PRN
  Administered 2013-01-05: 30 mL

## 2013-01-05 MED ORDER — CITALOPRAM HYDROBROMIDE 20 MG PO TABS
20.0000 mg | ORAL_TABLET | Freq: Every day | ORAL | Status: DC
  Filled 2013-01-05 (×2): qty 1

## 2013-01-05 MED ORDER — METOPROLOL TARTRATE 25 MG PO TABS
25.0000 mg | ORAL_TABLET | Freq: Two times a day (BID) | ORAL | Status: DC
  Administered 2013-01-06: 25 mg via ORAL
  Filled 2013-01-05 (×3): qty 1

## 2013-01-05 MED ORDER — AZELASTINE HCL 0.1 % NA SOLN
2.0000 | Freq: Every day | NASAL | Status: DC
  Administered 2013-01-06: 2 via NASAL
  Filled 2013-01-05: qty 30

## 2013-01-05 MED ORDER — PROPOFOL 10 MG/ML IV BOLUS
INTRAVENOUS | Status: DC | PRN
  Administered 2013-01-05: 200 mg via INTRAVENOUS

## 2013-01-05 MED ORDER — ACYCLOVIR 200 MG PO CAPS
400.0000 mg | ORAL_CAPSULE | Freq: Two times a day (BID) | ORAL | Status: DC
  Administered 2013-01-06: 400 mg via ORAL
  Filled 2013-01-05 (×3): qty 2

## 2013-01-05 MED ORDER — TERAZOSIN HCL 5 MG PO CAPS
5.0000 mg | ORAL_CAPSULE | Freq: Every day | ORAL | Status: DC
  Administered 2013-01-06: 5 mg via ORAL
  Filled 2013-01-05 (×2): qty 1

## 2013-01-05 MED ORDER — LACTATED RINGERS IV SOLN
INTRAVENOUS | Status: DC | PRN
  Administered 2013-01-05: 07:00:00 via INTRAVENOUS

## 2013-01-05 MED ORDER — OXYCODONE HCL 5 MG/5ML PO SOLN: 5.0000 mg | Freq: Once | ORAL | Status: AC | PRN

## 2013-01-05 MED ORDER — ONDANSETRON HCL 4 MG/2ML IJ SOLN: 4.0000 mg | Freq: Four times a day (QID) | INTRAMUSCULAR | Status: DC | PRN

## 2013-01-05 MED ORDER — ACETAMINOPHEN 325 MG PO TABS: 325.0000 mg | ORAL_TABLET | ORAL | Status: DC | PRN

## 2013-01-05 MED ORDER — SIMVASTATIN 40 MG PO TABS
40.0000 mg | ORAL_TABLET | Freq: Every day | ORAL | Status: DC
  Filled 2013-01-05 (×2): qty 1

## 2013-01-05 MED ORDER — HYDRALAZINE HCL 20 MG/ML IJ SOLN: 10.0000 mg | INTRAMUSCULAR | Status: DC | PRN

## 2013-01-05 MED ORDER — MAGNESIUM SULFATE 40 MG/ML IJ SOLN
2.0000 g | Freq: Once | INTRAMUSCULAR | Status: AC | PRN
  Filled 2013-01-05: qty 50

## 2013-01-05 MED ORDER — LIDOCAINE HCL (CARDIAC) 20 MG/ML IV SOLN
INTRAVENOUS | Status: DC | PRN
  Administered 2013-01-05: 80 mg via INTRAVENOUS

## 2013-01-05 MED ORDER — 0.9 % SODIUM CHLORIDE (POUR BTL) OPTIME
TOPICAL | Status: DC | PRN
  Administered 2013-01-05: 1000 mL

## 2013-01-05 MED ORDER — ACETAMINOPHEN-CODEINE #3 300-30 MG PO TABS: 1.0000 | ORAL_TABLET | Freq: Four times a day (QID) | ORAL | Status: DC | PRN

## 2013-01-05 MED ORDER — METOPROLOL TARTRATE 1 MG/ML IV SOLN: 2.0000 mg | INTRAVENOUS | Status: DC | PRN

## 2013-01-05 MED ORDER — SENNOSIDES-DOCUSATE SODIUM 8.6-50 MG PO TABS
1.0000 | ORAL_TABLET | Freq: Every evening | ORAL | Status: DC | PRN
  Administered 2013-01-06: 1 via ORAL
  Filled 2013-01-05: qty 1

## 2013-01-05 MED ORDER — FUROSEMIDE 20 MG PO TABS
20.0000 mg | ORAL_TABLET | Freq: Every day | ORAL | Status: DC
  Administered 2013-01-06: 20 mg via ORAL
  Filled 2013-01-05 (×2): qty 1

## 2013-01-05 MED ORDER — ACETAMINOPHEN 650 MG RE SUPP: 325.0000 mg | RECTAL | Status: DC | PRN

## 2013-01-05 MED ORDER — HEPARIN SODIUM (PORCINE) 1000 UNIT/ML IJ SOLN
INTRAMUSCULAR | Status: DC | PRN
  Administered 2013-01-05: 9000 [IU] via INTRAVENOUS

## 2013-01-05 MED ORDER — MORPHINE SULFATE 2 MG/ML IJ SOLN
2.0000 mg | INTRAMUSCULAR | Status: DC | PRN
  Administered 2013-01-05 – 2013-01-06 (×4): 2 mg via INTRAVENOUS
  Filled 2013-01-05 (×4): qty 1

## 2013-01-05 MED ORDER — FENTANYL CITRATE 0.05 MG/ML IJ SOLN
25.0000 ug | INTRAMUSCULAR | Status: AC | PRN
  Administered 2013-01-05 (×5): 25 ug via INTRAVENOUS

## 2013-01-05 MED ORDER — ONDANSETRON HCL 4 MG/2ML IJ SOLN
INTRAMUSCULAR | Status: DC | PRN
  Administered 2013-01-05: 4 mg via INTRAVENOUS

## 2013-01-05 MED ORDER — FERROUS SULFATE 325 (65 FE) MG PO TABS
325.0000 mg | ORAL_TABLET | Freq: Every day | ORAL | Status: DC
Start: 2013-01-06 — End: 2013-01-06
  Filled 2013-01-05 (×2): qty 1

## 2013-01-05 MED ORDER — FENTANYL CITRATE 0.05 MG/ML IJ SOLN
INTRAMUSCULAR | Status: DC | PRN
  Administered 2013-01-05: 50 ug via INTRAVENOUS
  Administered 2013-01-05: 100 ug via INTRAVENOUS
  Administered 2013-01-05 (×2): 50 ug via INTRAVENOUS

## 2013-01-05 MED ORDER — LIDOCAINE HCL 4 % MT SOLN
OROMUCOSAL | Status: DC | PRN
  Administered 2013-01-05: 4 mL via TOPICAL

## 2013-01-05 MED ORDER — ALUM & MAG HYDROXIDE-SIMETH 200-200-20 MG/5ML PO SUSP: 15.0000 mL | ORAL | Status: DC | PRN

## 2013-01-05 MED ORDER — HEPARIN SODIUM (PORCINE) 5000 UNIT/ML IJ SOLN
INTRAMUSCULAR | Status: DC | PRN
  Administered 2013-01-05: 07:00:00

## 2013-01-05 MED ORDER — BUPROPION HCL ER (SR) 150 MG PO TB12
150.0000 mg | ORAL_TABLET | Freq: Every day | ORAL | Status: DC | PRN
  Administered 2013-01-06: 150 mg via ORAL
  Filled 2013-01-05: qty 1

## 2013-01-05 MED ORDER — EPHEDRINE SULFATE 50 MG/ML IJ SOLN
INTRAMUSCULAR | Status: DC | PRN
  Administered 2013-01-05: 5 mg via INTRAVENOUS
  Administered 2013-01-05: 10 mg via INTRAVENOUS

## 2013-01-05 MED ORDER — DABIGATRAN ETEXILATE MESYLATE 150 MG PO CAPS
150.0000 mg | ORAL_CAPSULE | Freq: Two times a day (BID) | ORAL | Status: DC
  Administered 2013-01-06: 150 mg via ORAL
  Filled 2013-01-05 (×2): qty 1

## 2013-01-05 MED ORDER — LIDOCAINE HCL (PF) 1 % IJ SOLN
INTRAMUSCULAR | Status: AC
  Filled 2013-01-05: qty 30

## 2013-01-05 MED ORDER — LABETALOL HCL 5 MG/ML IV SOLN: 10.0000 mg | INTRAVENOUS | Status: DC | PRN

## 2013-01-05 MED ORDER — DEXTROSE 5 % IV SOLN
1.5000 g | Freq: Two times a day (BID) | INTRAVENOUS | Status: AC
  Administered 2013-01-05 – 2013-01-06 (×2): 1.5 g via INTRAVENOUS
  Filled 2013-01-05 (×2): qty 1.5

## 2013-01-05 MED ORDER — METHOCARBAMOL 750 MG PO TABS
750.0000 mg | ORAL_TABLET | Freq: Two times a day (BID) | ORAL | Status: DC
  Administered 2013-01-06: 750 mg via ORAL
  Filled 2013-01-05 (×3): qty 1

## 2013-01-05 MED ORDER — SODIUM CHLORIDE 0.9 % IV SOLN: 500.0000 mL | Freq: Once | INTRAVENOUS | Status: AC | PRN

## 2013-01-05 MED ORDER — PREDNISONE 20 MG PO TABS: 20.0000 mg | ORAL_TABLET | ORAL | Status: DC

## 2013-01-05 MED ORDER — PROTAMINE SULFATE 10 MG/ML IV SOLN
INTRAVENOUS | Status: DC | PRN
  Administered 2013-01-05: 10 mg via INTRAVENOUS
  Administered 2013-01-05: 20 mg via INTRAVENOUS
  Administered 2013-01-05 (×2): 10 mg via INTRAVENOUS

## 2013-01-05 MED ORDER — PANTOPRAZOLE SODIUM 40 MG PO TBEC
40.0000 mg | DELAYED_RELEASE_TABLET | Freq: Every day | ORAL | Status: DC
  Administered 2013-01-05 – 2013-01-06 (×2): 40 mg via ORAL
  Filled 2013-01-05 (×2): qty 1

## 2013-01-05 MED ORDER — SODIUM CHLORIDE 0.9 % IV SOLN
INTRAVENOUS | Status: DC
  Administered 2013-01-05: 17:00:00 via INTRAVENOUS

## 2013-01-05 SURGICAL SUPPLY — 46 items
APL SKNCLS STERI-STRIP NONHPOA (GAUZE/BANDAGES/DRESSINGS) ×2
BENZOIN TINCTURE PRP APPL 2/3 (GAUZE/BANDAGES/DRESSINGS) ×3 IMPLANT
CANISTER SUCTION 2500CC (MISCELLANEOUS) ×3 IMPLANT
CATH ROBINSON RED A/P 18FR (CATHETERS) ×3 IMPLANT
CLIP LIGATING EXTRA MED SLVR (CLIP) ×3 IMPLANT
CLIP LIGATING EXTRA SM BLUE (MISCELLANEOUS) ×3 IMPLANT
CLOTH BEACON ORANGE TIMEOUT ST (SAFETY) ×3 IMPLANT
COVER SURGICAL LIGHT HANDLE (MISCELLANEOUS) ×3 IMPLANT
CRADLE DONUT ADULT HEAD (MISCELLANEOUS) ×3 IMPLANT
DECANTER SPIKE VIAL GLASS SM (MISCELLANEOUS) IMPLANT
DRAIN HEMOVAC 1/8 X 5 (WOUND CARE) IMPLANT
DRAPE WARM FLUID 44X44 (DRAPE) ×3 IMPLANT
DRSG COVADERM 4X8 (GAUZE/BANDAGES/DRESSINGS) ×1 IMPLANT
ELECT REM PT RETURN 9FT ADLT (ELECTROSURGICAL) ×3
ELECTRODE REM PT RTRN 9FT ADLT (ELECTROSURGICAL) ×2 IMPLANT
EVACUATOR SILICONE 100CC (DRAIN) IMPLANT
GEL ULTRASOUND 20GR AQUASONIC (MISCELLANEOUS) IMPLANT
GLOVE BIOGEL PI IND STRL 7.0 (GLOVE) IMPLANT
GLOVE BIOGEL PI IND STRL 7.5 (GLOVE) IMPLANT
GLOVE BIOGEL PI INDICATOR 7.0 (GLOVE) ×1
GLOVE BIOGEL PI INDICATOR 7.5 (GLOVE) ×1
GLOVE ECLIPSE 6.5 STRL STRAW (GLOVE) ×1 IMPLANT
GLOVE SS BIOGEL STRL SZ 7.5 (GLOVE) ×2 IMPLANT
GLOVE SUPERSENSE BIOGEL SZ 7.5 (GLOVE) ×2
GOWN STRL NON-REIN LRG LVL3 (GOWN DISPOSABLE) ×9 IMPLANT
KIT BASIN OR (CUSTOM PROCEDURE TRAY) ×3 IMPLANT
KIT ROOM TURNOVER OR (KITS) ×3 IMPLANT
NEEDLE 22X1 1/2 (OR ONLY) (NEEDLE) IMPLANT
NS IRRIG 1000ML POUR BTL (IV SOLUTION) ×6 IMPLANT
PACK CAROTID (CUSTOM PROCEDURE TRAY) ×3 IMPLANT
PAD ARMBOARD 7.5X6 YLW CONV (MISCELLANEOUS) ×6 IMPLANT
PATCH HEMASHIELD 8X75 (Vascular Products) ×1 IMPLANT
SHUNT CAROTID BYPASS 10 (VASCULAR PRODUCTS) ×1 IMPLANT
SHUNT CAROTID BYPASS 12FRX15.5 (VASCULAR PRODUCTS) IMPLANT
STRIP CLOSURE SKIN 1/2X4 (GAUZE/BANDAGES/DRESSINGS) ×3 IMPLANT
SUT ETHILON 3 0 PS 1 (SUTURE) IMPLANT
SUT PROLENE 6 0 CC (SUTURE) ×3 IMPLANT
SUT SILK 3 0 (SUTURE)
SUT SILK 3-0 18XBRD TIE 12 (SUTURE) IMPLANT
SUT VIC AB 3-0 SH 27 (SUTURE) ×6
SUT VIC AB 3-0 SH 27X BRD (SUTURE) ×4 IMPLANT
SUT VICRYL 4-0 PS2 18IN ABS (SUTURE) ×3 IMPLANT
SYR CONTROL 10ML LL (SYRINGE) IMPLANT
TOWEL OR 17X24 6PK STRL BLUE (TOWEL DISPOSABLE) ×3 IMPLANT
TOWEL OR 17X26 10 PK STRL BLUE (TOWEL DISPOSABLE) ×3 IMPLANT
WATER STERILE IRR 1000ML POUR (IV SOLUTION) ×3 IMPLANT

## 2013-01-05 NOTE — Op Note (Signed)
[facility] [place]  Patient name: [name] MRN: [id number] DOB: [birth year] Sex: male  01/05/2013 Pre-operative Diagnosis: Asymptomatic right carotid stenosis Post-operative diagnosis:  Same Surgeon:  Rosetta Posner, M.D. Assistants:  Theda Sers Procedure:    right carotid Endarterectomy with Dacron patch angioplasty Anesthesia:  General Blood Loss:  See anesthesia record Specimens:  Carotid Plaque to pathology  Indications for surgery:  Severe asymptomatic  Procedure in detail:  The patient was taken to the operating and placed in the supine position. The neck was prepped and draped in the usual sterile fashion. An incision was made anterior to the sternocleidomastoid muscle and continued with electrocautery through the platysma muscle. The muscle was retracted posteriorly and the carotid sheath was opened. The facial vein was ligated with 2-0 silk ties and divided. The common carotid artery was encircled with an umbilical tape and Rummel tourniquet. Dissection was continued onto the carotid bifurcation. The superior thyroid artery was controlled with a 2-0 silk Potts tie. The external carotid organ was encircled with a vessel loop and the internal carotid was encircled with umbilical tape and Rummel tourniquet. The hypoglossal and vagus nerves were identified and preserved.  The patient was given systemic heparinization. After adequate circulation time, the internal,external and common carotid arteries were occluded. The common carotid was opened with an 11 blade and the arteriotomy was continued with Potts scissors onto the internal carotid artery. A 10 shunt was passed up the internal carotid artery, allowed to back bleed, and then passed down the common carotid artery. The shunt was secured with Rummel tourniquet. The endarterectomy was begun on the common carotid artery  plaque was divided proximally with Potts scissors. The endarterectomy was continued onto the carotid  bifurcation. The external carotid was endarterectomized by eversion technique and the internal carotid artery was endarterectomized in an open fashion. Remaining debris was removed from the endarterectomy plane. A Dacron patch was brought to the field and sewn as a patch angioplasty. Prior to completing the anastomosis, the shunt was removed and the usual flushing maneuvers were undertaken. The anastomosis was then completed and flow was restored first to the external and then the internal carotid artery. Excellent flow characteristics were noted with hand-held Doppler in the internal and external carotid arteries.  The patient was given protamine to reverse the heparin. Hemostasis was obtained with electrocautery. The wounds were irrigated with saline. The wound was closed by first reapproximating the sternocleidomastoid muscle over the carotid artery with interrupted 3-0 Vicryl sutures. Next, the platysma was closed with a running 3-0 Vicryl suture. The skin was closed with a 4-0 subcuticular Vicryl suture. Benzoin and Steri-Strips were applied to the incision. A sterile dressing was placed over the incision. All sponge and needle counts were correct. The patient was awakened in the operating room, neurologically intact. They were transferred to the PACU in stable condition.  Carotid stenosis at surgery:>80%  Disposition:  To PACU in stable condition,neurologically intact  Relevant Operative Details:  Normal  Rosetta Posner, M.D. [facility] [place] Office: [phone] Pager:  [phone]

## 2013-01-05 NOTE — Progress Notes (Signed)
Utilization review completed.  

## 2013-01-05 NOTE — Anesthesia Procedure Notes (Signed)
Procedure Name: Intubation Date/Time: 01/05/2013 7:35 AM Performed by: Kyung Rudd Pre-anesthesia Checklist: Patient identified, Emergency Drugs available, Suction available, Patient being monitored and Timeout performed Patient Re-evaluated:Patient Re-evaluated prior to inductionOxygen Delivery Method: Circle system utilized Preoxygenation: Pre-oxygenation with 100% oxygen Intubation Type: IV induction Ventilation: Mask ventilation without difficulty and Oral airway inserted - appropriate to patient size Laryngoscope Size: Mac and 4 Grade View: Grade I Tube type: Oral Tube size: 7.5 mm Number of attempts: 1 Airway Equipment and Method: Stylet and LTA kit utilized Placement Confirmation: ETT inserted through vocal cords under direct vision,  positive ETCO2 and breath sounds checked- equal and bilateral Secured at: 22 cm Tube secured with: Tape Dental Injury: Teeth and Oropharynx as per pre-operative assessment

## 2013-01-05 NOTE — H&P (Signed)
[name]  12/02/2012 9:15 AM   Office Visit  MRN:  [id number]   Description: 79 year old male  Provider: Rosetta Posner, MD  Department: [facility]        Diagnoses    Occlusion and stenosis of carotid artery without mention of cerebral infarction, unspecified laterality    -  Primary    433.10      Reason for Visit    New Evaluation    referred by Dr. Stanford Breed      Carotid        Current Vitals - Last Recorded    BP Pulse Resp Ht Wt BMI    119/67 72 18 6' (1.829 m) 245 lb 14.4 oz (111.54 kg) 33.34 kg/m2       Progress Notes    Rosetta Posner, MD at 12/02/2012  1:48 PM    Status: Signed                   [facility]     Patient name: [name]            MRN: [id number]        DOB: [birth year]          Sex: male     Referred by: Stanford Breed   Reason for referral:   Chief Complaint   Patient presents with   .  New Evaluation       referred by Dr. Stanford Breed     .  Carotid        HISTORY OF PRESENT ILLNESS: The patient is a 79 year old gentleman with a long history of asymptomatic carotid stenosis. This is been followed with serial Dopplers for nearly 20 years. He has always maintain a moderate to severe level of stenoses bilaterally. Recently he had a repeat study and this did show progression to severe stenosis in the right internal carotid artery. He is right-handed. He specifically denies any history of amaurosis fugax, transient ischemic attack or stroke. He does have a significant past cardiac history with prior coronary bypass grafting in 1994. He recently underwent a Myoview which showed no evidence of myocardial ischemia but did show questionable thyroid lesion and he is to have further evaluation of this on July 21. He does have multiple medical problems to include hyperlipidemia aortic stenosis atrial fibrillation on Pradaxa and hypertension    Past Medical History   Diagnosis  Date   .  History of peptic ulcer  disease     .  Anemia     .  Hyperlipidemia     .  Aortic stenosis      .  Peripheral vascular disease     .  Atrial fibrillation     .  Carotid stenosis     .  Renal artery stenosis     .  CAD (coronary artery disease)     .  Hypertension           Past Surgical History   Procedure  Laterality  Date   .  Coronary artery bypass graft    1994   .  Renal artery stent           stenting of the left renal artery as well as a cutting balloon angioplasty for treatment of in-stent restenosis coronary artery bypass grafting in 1994.   [name]  Knee surgery    08/2003       left knee   .  Removal of bleeding ulcer    1994         History       Social History   .  Marital Status:  Married       Spouse Name:  N/A       Number of Children:  N/A   .  Years of Education:  N/A       Occupational History   .  Not on file.       Social History Main Topics   .  Smoking status:  Former Smoker       Quit date:  04/27/1993   .  Smokeless tobacco:  Not on file   .  Alcohol Use:  No   .  Drug Use:  No   .  Sexually Active:  Yes       Other Topics  Concern   .  Not on file       Social History Narrative     Social History:     Married      Tobacco Use - No.      Alcohol Use - yes               Family History:     Mother died at age 89 of coronary disease.  Father died at 56 of coronary disease.  He has 13 siblings, almost all of whom have coronary disease and some with premature onset.         Family History   Problem  Relation  Age of Onset   .  Coronary artery disease  Mother     .  Heart disease  Mother     .  Coronary artery disease  Father     .  Heart disease  Father     .  Coronary artery disease           13 sibling, almost all have coronary disease and some with premature onset   .  Heart disease           13 sibling, almost all have coronary disease and some with premature onset         Allergies as of 12/02/2012 - Review Complete 12/02/2012   Allergen   Reaction  Noted   .  Clarithromycin    09/12/2010         Current Outpatient Prescriptions on File Prior to Visit   Medication  Sig  Dispense  Refill   .  acetaminophen (TYLENOL) 500 MG tablet  Take 500 mg by mouth every 6 (six) hours as needed.           [name]  acetaminophen-codeine (TYLENOL #2) 300-15 MG per tablet  Take 1 tablet by mouth as directed.          [name]  acyclovir (ZOVIRAX) 800 MG tablet  1/2 tab po bid          .  albuterol (PROVENTIL,VENTOLIN) 90 MCG/ACT inhaler  Inhale 2 puffs into the lungs 4 (four) times daily.           [name]  allopurinol (ZYLOPRIM) 300 MG tablet  Take 300 mg by mouth daily.           .  benazepril (LOTENSIN) 20 MG tablet  Take 20 mg by mouth daily.           .  budesonide-formoterol (SYMBICORT) 160-4.5 MCG/ACT inhaler  Inhale 2 puffs into the lungs 2 (two) times daily.           [name]  buPROPion (WELLBUTRIN SR) 150 MG 12 hr tablet  Take 150 mg by mouth daily.          .  citalopram (CELEXA) 40 MG tablet  Take by mouth daily. 1/2 tablet daily          .  dabigatran (PRADAXA) 150 MG CAPS  Take 150 mg by mouth every 12 (twelve) hours.           .  ferrous sulfate 325 (65 FE) MG tablet  Take 325 mg by mouth daily with breakfast.           .  furosemide (LASIX) 20 MG tablet  Take 20 mg by mouth daily.           [name]  ipratropium-albuterol (DUONEB) 0.5-2.5 (3) MG/3ML SOLN  Take 3 mLs by nebulization as directed.           .  lansoprazole (PREVACID) 30 MG capsule  Take 30 mg by mouth daily.           .  methocarbamol (ROBAXIN) 750 MG tablet  Take 750 mg by mouth 2 (two) times daily.          .  metoprolol (LOPRESSOR) 50 MG tablet  1/2 tablet two times daily         .  mometasone (NASONEX) 50 MCG/ACT nasal spray  2 sprays by Nasal route daily.           .  Multiple Vitamin (MULTIVITAMIN) tablet  Take 1 tablet by mouth daily.           .  predniSONE (DELTASONE) 20 MG tablet  Take 20 mg by mouth as directed.           .  simvastatin (ZOCOR) 40 MG tablet  Take 1 tablet (40 mg  total) by mouth at bedtime.   90 tablet   3   .  terazosin (HYTRIN) 5 MG capsule  Take 5 mg by mouth at bedtime.           .  trolamine salicylate (ASPERCREME) 10 % cream  Apply topically as needed.               No current facility-administered medications on file prior to visit.          REVIEW OF SYSTEMS:   Positives indicated with an "X"   CARDIOVASCULAR:  [ ]  chest pain   [ ]  chest pressure   [ ]  palpitations   [ ]  orthopnea               [ ]  dyspnea on exertion   [ ]  claudication   [ ]  rest pain   [ ]  DVT   [ ]  phlebitis PULMONARY:   [ ]  productive cough   [ ]  asthma   [ ]  wheezing NEUROLOGIC:   [ ]  weakness  [ ]  paresthesias  [ ]  aphasia  [ ]  amaurosis  [ ]  dizziness HEMATOLOGIC:   [ ]  bleeding problems   [ ]  clotting disorders MUSCULOSKELETAL:  [ ]  joint pain   [ ]  joint swelling GASTROINTESTINAL: [ ]   blood in stool  [ ]   hematemesis GENITOURINARY:  [ ]   dysuria  [ ]   hematuria PSYCHIATRIC:  [ ]  history of major depression INTEGUMENTARY:  [ ]  rashes  [ ]  ulcers CONSTITUTIONAL:  [ ]  fever   [ ]  chills   PHYSICAL EXAMINATION:   General: The patient is a well-nourished male, in no acute distress.  Vital signs are BP 119/67  Pulse 72  Resp 18  Ht 6' (1.829 m)  Wt 245 lb 14.4 oz (111.54 kg)  BMI 33.34 kg/m2 Pulmonary: There is a good air exchange bilaterally without wheezing or rales. Abdomen: Soft and non-tender with normal pitch bowel sounds. Musculoskeletal: There are no major deformities.  There is no significant extremity pain. Neurologic: No focal weakness or paresthesias are detected, Skin: There are no ulcer or rashes noted. Psychiatric: The patient has normal affect. Cardiovascular: There is a regular rate and rhythm. He does have a systolic murmur Carotid artery with a bruit in his right neck and no bruit on the left 2+ radial pulses bilaterally       [facility] V[facility] Lab Studies:   Ordered and Independently Reviewed . He did have a repeat right  carotid imaging to confirm that he is a candidate for endarterectomy based on duplex and that he does have a high-grade stenosis. This is the case and he does have focal stenosis at the bifurcation with normal internal carotid artery above this.     Impression and Plan:   Severe asymptomatic right internal carotid artery stenosis. Had a long discussion with his patent with the patient and his wife present. I explained the risks of the high-grade asymptomatic carotid stenosis and also the is for surgery. I explained pressure 1% risk of stroke with surgery. Also explained low incidence of cranial nerve injury. I would recommend endarterectomy based on the duplex. I have recommended that we await until he has further thyroid imaging studies to assure that he does not require thyroid surgery which are be unlikely. We have tentatively scheduled surgery for August 4. He understands this would be one night admission discharge to home on postoperative day 1.       Kizer Nobbe [facility] Office: [phone]           Addendum:  The patient has been re-examined and re-evaluated.  The patient's history and physical has been reviewed and is unchanged.    DARRIEL MANDIA is a 79 y.o. male is being admitted with RIGHT ICA STENOSIS. All the risks, benefits and other treatment options have been discussed with the patient. The patient has consented to proceed with Procedure(s): ENDARTERECTOMY CAROTID as a surgical intervention.  Tarek Cravens 01/05/2013 7:16 AM [facility]

## 2013-01-05 NOTE — Transfer of Care (Signed)
Immediate Anesthesia Transfer of Care Note  Patient: [name]  Procedure(s) Performed: Procedure(s): ENDARTERECTOMY CAROTID (Right) PATCH ANGIOPLASTY (Right)  Patient Location: PACU  Anesthesia Type:General  Level of Consciousness: awake, alert  and oriented  Airway & Oxygen Therapy: Patient Spontanous Breathing and Patient connected to nasal cannula oxygen  Post-op Assessment: Report given to PACU RN, Post -op Vital signs reviewed and stable and Patient moving all extremities X 4  Post vital signs: Reviewed and stable  Complications: No apparent anesthesia complications

## 2013-01-05 NOTE — Consult Note (Signed)
ANTIBIOTIC CONSULT NOTE - INITIAL  Pharmacy Consult for : Zinacef Indication: Post op antibiotic prophylaxsis  Allergies Clarithromycin  Medications:   Zinacef 1.5 gm IV q 12 hours x 2 doses  Assessment:  79 y/o male admitted w/Occlusion and stenosis of carotid artery without mention of cerebral infarction  S/P CEA today placed on Zinacef x 2 doses post-op.  CrCl 91 ml/min  Goal of Therapy:   Renal Adjustment of Antibiotics  Plan:   Continue Zinacef as ordered.  No renal adjustment needed.  Pharmacy will sign off at this time, please call for further questions  Estelle June,  Pharm.D.  01/05/2013 3:36 PM

## 2013-01-05 NOTE — Anesthesia Preprocedure Evaluation (Addendum)
Anesthesia Evaluation  Patient identified by MRN, date of birth, ID band Patient awake    Reviewed: Allergy & Precautions, H&P , NPO status , Patient's Chart, lab work & pertinent test results  Airway Mallampati: II  Neck ROM: full    Dental  (+) Edentulous Upper and Partial Lower   Pulmonary sleep apnea , COPD         Cardiovascular hypertension, + CAD, + Past MI, + CABG and + Peripheral Vascular Disease + dysrhythmias Atrial Fibrillation + Valvular Problems/Murmurs AS     Neuro/Psych Anxiety Depression    GI/Hepatic GERD-  ,  Endo/Other  diabetes, Type 2obese  Renal/GU      Musculoskeletal  (+) Arthritis -,   Abdominal   Peds  Hematology   Anesthesia Other Findings   Reproductive/Obstetrics                          Anesthesia Physical Anesthesia Plan  ASA: III  Anesthesia Plan: General   Post-op Pain Management:    Induction: Intravenous  Airway Management Planned: Oral ETT  Additional Equipment: Arterial line  Intra-op Plan:   Post-operative Plan: Extubation in OR  Informed Consent: I have reviewed the patients History and Physical, chart, labs and discussed the procedure including the risks, benefits and alternatives for the proposed anesthesia with the patient or authorized representative who has indicated his/her understanding and acceptance.     Plan Discussed with: CRNA, Anesthesiologist and Surgeon  Anesthesia Plan Comments:         Anesthesia Quick Evaluation

## 2013-01-05 NOTE — Anesthesia Postprocedure Evaluation (Signed)
Anesthesia Post Note  Patient: [name]  Procedure(s) Performed: Procedure(s) (LRB): ENDARTERECTOMY CAROTID (Right) PATCH ANGIOPLASTY (Right)  Anesthesia type: General  Patient location: PACU  Post pain: Pain level controlled and Adequate analgesia  Post assessment: Post-op Vital signs reviewed, Patient's Cardiovascular Status Stable, Respiratory Function Stable, Patent Airway and Pain level controlled  Last Vitals:  Filed Vitals:   01/05/13 1048  BP: 124/52  Pulse: 61  Temp:   Resp: 16    Post vital signs: Reviewed and stable  Level of consciousness: awake, alert  and oriented  Complications: No apparent anesthesia complications

## 2013-01-05 NOTE — Progress Notes (Signed)
Patient unable to void despite attempts to void.  Patient did not feel an urge to void.  Bladder scan at 2245 showed a volume of 920 mL in bladder.  I&O cath performed with sterile technique.  1050 mL amber urine drained from bladder.  Patient tolerated procedure well and is currently resting.

## 2013-01-05 NOTE — Preoperative (Signed)
Beta Blockers   Reason not to administer Beta Blockers:Not Applicable 

## 2013-01-06 ENCOUNTER — Encounter (HOSPITAL_COMMUNITY): Payer: Self-pay | Admitting: Vascular Surgery

## 2013-01-06 LAB — BASIC METABOLIC PANEL
BUN: 6 mg/dL (ref 6–23)
Calcium: 7.7 mg/dL — ABNORMAL LOW (ref 8.4–10.5)
Chloride: 106 mEq/L (ref 96–112)
Creatinine, Ser: 0.58 mg/dL (ref 0.50–1.35)
GFR calc Af Amer: >90 mL/min (ref >90)
GFR calc non Af Amer: >90 mL/min (ref >90)

## 2013-01-06 LAB — CBC
Hemoglobin: 11.5 g/dL — ABNORMAL LOW (ref 13.0–17.0)
RBC: 3.77 MIL/uL — ABNORMAL LOW (ref 4.22–5.81)

## 2013-01-06 LAB — GLUCOSE, CAPILLARY: Glucose-Capillary: 117 mg/dL — ABNORMAL HIGH (ref 70–99)

## 2013-01-06 NOTE — Progress Notes (Signed)
Bladder scan shows 520 mL urine in bladder.  Patient has urge to void.  Unable to void at this time.  Will defer the choice to insert indwelling catheter to MD upon AM rounds r/t patient's anticipation of potential discharge today.  Patient is not uncomfortable at this time.

## 2013-01-06 NOTE — Progress Notes (Addendum)
Paged [place], [place]. Pt. Urinated. Awaiting return call. .  1254: Orders will be placed in for discharge.

## 2013-01-06 NOTE — Discharge Summary (Signed)
Physician Discharge Summary   Patient ID: [name] MRN: [id number] DOB/AGE: [birth year] 77 y.o.   Admit date: 01/05/2013 Discharge date: 01/06/2013   Admission Diagnosis: Right carotid stenosis asymptomatic  Discharge Diagnoses:  Right carotid stenosis asymptomatic  Secondary Diagnoses: Past Medical History  Diagnosis Date  . History of peptic ulcer disease   . Anemia   . Hyperlipidemia   . Aortic stenosis    . Peripheral vascular disease   . Atrial fibrillation   . Carotid stenosis   . Renal artery stenosis   . CAD (coronary artery disease)   . Hypertension   . Myocardial infarction     X2  . COPD (chronic obstructive pulmonary disease)   . Pneumonia     HX OF  . Sleep apnea     OXYGEN AT NIGHT 2L [place]  . Diabetes mellitus without complication     FASTING 98-120S  . GERD (gastroesophageal reflux disease)   . History of blood transfusion   . Depression   . Anxiety      Procedures: 1.  right carotid endarterectomy  (01/05/2013)   Discharged Condition: good   Hospital Course:  [name] is a 79 y.o. male with right asymptomatic internal carotid stenosis >80%.  The patient underwent right carotid endarterectomy with.  The patient post-operative course was:   unremarkable with intact neurologic function and stable hemodynamics.  The patient's incision had:  no hematoma.  The patient was  tolerating food by mouth on discharge.  The patient will follow up with Dr. Donnetta Hutching in 2 weeks for wound check.    He has been unable to independently void.   He was started on Flomax and was able to void without difficulty later in the day on POD 1.  Consults:     Significant Diagnostic Studies: CBC    Component Value Date/Time   WBC 7.2 01/06/2013 0410   RBC 3.77* 01/06/2013 0410   HGB 11.5* 01/06/2013 0410   HCT 34.1* 01/06/2013 0410   PLT 149* 01/06/2013 0410   MCV 90.5 01/06/2013 0410   MCH 30.5 01/06/2013 0410   MCHC 33.7 01/06/2013 0410   RDW 15.3 01/06/2013 0410    LYMPHSABS 2.0 10/06/2009 0000   MONOABS 0.8 10/06/2009 0000   EOSABS 0.0 10/06/2009 0000   BASOSABS 0.0 10/06/2009 0000    BMET    Component Value Date/Time   NA 137 01/06/2013 0410   K 3.5 01/06/2013 0410   CL 106 01/06/2013 0410   CO2 24 01/06/2013 0410   GLUCOSE 115* 01/06/2013 0410   BUN 6 01/06/2013 0410   CREATININE 0.58 01/06/2013 0410   CALCIUM 7.7* 01/06/2013 0410   GFRNONAA >90 01/06/2013 0410   GFRAA >90 01/06/2013 0410    COAG Lab Results  Component Value Date   INR 1.20 12/23/2012   INR 1.1 09/28/2009   INR 2.1 09/01/2009   PROTIME 20.6 11/04/2008   No results found for this basename: PTT     Disposition:   Discharge Orders   Future Orders Complete By Expires     Call MD for:  redness, tenderness, or signs of infection (pain, swelling, bleeding, redness, odor or green/yellow discharge around incision site)  As directed     Call MD for:  severe or increased pain, loss or decreased feeling  in affected limb(s)  As directed     Call MD for:  temperature >100.5  As directed     Driving Restrictions  As directed  Comments:      No driving for 2 weeks    Increase activity slowly  As directed     Comments:      Walk with assistance use walker or cane as needed    Lifting restrictions  As directed     Comments:      No lifting for 6 weeks    May shower   As directed     Resume previous diet  As directed          Medication List         acetaminophen 500 MG tablet  Commonly known as:  TYLENOL  Take 500 mg by mouth every 6 (six) hours as needed for pain.     acetaminophen-codeine 300-30 MG per tablet  Commonly known as:  TYLENOL #3  Take 1 tablet by mouth at bedtime as needed for pain.     acyclovir 800 MG tablet  Commonly known as:  ZOVIRAX  Take 400 mg by mouth 2 (two) times daily.     albuterol 90 MCG/ACT inhaler  Commonly known as:  PROVENTIL,VENTOLIN  Inhale 2 puffs into the lungs 2 (two) times daily.     allopurinol 300 MG tablet  Commonly known as:  ZYLOPRIM   Take 300 mg by mouth daily.     azelastine 137 MCG/SPRAY nasal spray  Commonly known as:  ASTELIN  Place 2 sprays into the nose daily.     benazepril 20 MG tablet  Commonly known as:  LOTENSIN  Take 10 mg by mouth daily.     budesonide-formoterol 160-4.5 MCG/ACT inhaler  Commonly known as:  SYMBICORT  Inhale 2 puffs into the lungs daily as needed (shortness of breath/wheezing).     buPROPion 150 MG 12 hr tablet  Commonly known as:  WELLBUTRIN SR  Take 150 mg by mouth daily as needed (anxiety).     citalopram 40 MG tablet  Commonly known as:  CELEXA  Take 20 mg by mouth daily. 1/2 tablet daily     DUONEB 0.5-2.5 (3) MG/3ML Soln  Generic drug:  ipratropium-albuterol  Take 3 mLs by nebulization 3 (three) times daily as needed (for bronchitis or other virus that affects breathing).     ferrous sulfate 325 (65 FE) MG tablet  Take 325 mg by mouth daily with breakfast.     furosemide 20 MG tablet  Commonly known as:  LASIX  Take 20 mg by mouth daily.     lansoprazole 30 MG capsule  Commonly known as:  PREVACID  Take 30 mg by mouth daily.     methocarbamol 750 MG tablet  Commonly known as:  ROBAXIN  Take 750 mg by mouth 2 (two) times daily.     metoprolol 50 MG tablet  Commonly known as:  LOPRESSOR  Take 25 mg by mouth 2 (two) times daily.     mometasone 50 MCG/ACT nasal spray  Commonly known as:  NASONEX  2 sprays by Nasal route daily.     multivitamin with minerals Tabs tablet  Take 1 tablet by mouth daily.     multivitamin-lutein Caps capsule  Take 1 capsule by mouth daily.     PRADAXA 150 MG Caps capsule  Generic drug:  dabigatran  Take 150 mg by mouth every 12 (twelve) hours.     predniSONE 20 MG tablet  Commonly known as:  DELTASONE  Take 20 mg by mouth See admin instructions. Tapered dose as needed for gout attacks     simvastatin  40 MG tablet  Commonly known as:  ZOCOR  Take 1 tablet (40 mg total) by mouth at bedtime.     terazosin 5 MG capsule   Commonly known as:  HYTRIN  Take 5 mg by mouth at bedtime.     trolamine salicylate 10 % cream  Commonly known as:  ASPERCREME  Apply 1 application topically 4 (four) times daily as needed (pain).     Vitamin D 2000 UNITS Caps  Take 2,000 Units by mouth daily.          Theda Sers EMMA [facility] PA-C [facility] [place] [facility] Office: [phone]   01/06/2013, 7:33 AM --- For VQI Registry use --- Instructions: Press F2 to tab through selections.  Delete question if not applicable.   Modified Rankin score at D/C (0-6): Rankin Score=0  IV medication needed for:  1. Hypertension: No 2. Hypotension: No  Post-op Complications: No  1. Post-op CVA or TIA: No  If yes: Event classification (right eye, left eye, right cortical, left cortical, verterobasilar, other):   If yes: Timing of event (intra-op, <6 hrs post-op, >=6 hrs post-op, unknown):   2. CN injury: No  If yes: CN  injuried   3. Myocardial infarction: No  If yes: Dx by (EKG or clinical, Troponin):   4.  CHF: No  5.  Dysrhythmia (new): No  6. Wound infection: No  7. Reperfusion symptoms: No  8. Return to OR: No  If yes: return to OR for (bleeding, neurologic, other CEA incision, other):   Discharge medications: Statin use:  Yes ASA use:  Yes Beta blocker use:  Yes ACE-Inhibitor use:  Yes P2Y12 Antagonist use: [ ]  None, [ ]  Plavix, [ ]  Plasugrel, [ ]  Ticlopinine, [ ]  Ticagrelor, [ ]  Other, [ ]  No for medical reason, [ ]  Non-compliant, [ ]  Not-indicated Anti-coagulant use:  [ ]  None, [ ]  Warfarin, [ ]  Rivaroxaban, [x ] Dabigatran, [ ]  Other, [ ]  No for medical reason, [ ]  Non-compliant, [ ]  Not-indicated

## 2013-01-06 NOTE — Progress Notes (Signed)
Pt. Discharged with all belongings. Answered questions pertaining DC education, restrictions, and appts. Escorted to private vehicle driven by wife, RN escorted pt. Via wheelchair. R surgical site: dry, intact, no oozing noted. Steri strips in place.

## 2013-01-06 NOTE — Progress Notes (Addendum)
VASCULAR AND VEIN SURGERY POST - OP CEA NOTE  HPI:.id had a right Carotid Endarterectomy POD . Patient is doing well.  Patient denies headache; denies difficulty swallowing; denies weakness; denies symptoms of stroke or TIA Unable to void independently.  In/out cath last night   Physical Exam: Pt is A&O x 3 Speech is fluent right Neck Wound is clean, dry, intact Negative weakness bilateral upper/lower extremities Negative tongue deviation Negative facial droop  Plan: Follow-up in 2 weeks with Carotid Duplex scan Pending independent voiding before he can be discharged.  Patient states he has no history of difficulty. I have examined the patient, reviewed and agree with above.  Jerris Keltz, MD 01/06/2013 8:33 AM A looks quite good. No neurologic deficits. Did require I. and a cath for inability to void with a liter out. Does have a history of BPH symptoms. We will discharge later today if he is able to void independently

## 2013-01-06 NOTE — Progress Notes (Signed)
Pt. Unable to void. Bladder scanned 982ml. PA called, ordered straight cath. Straight cath output 870ml. Tolerated well. Will attempt voiding at noon hour.

## 2013-01-26 ENCOUNTER — Encounter: Payer: Self-pay | Admitting: Vascular Surgery

## 2013-01-27 ENCOUNTER — Ambulatory Visit (INDEPENDENT_AMBULATORY_CARE_PROVIDER_SITE_OTHER): Payer: Medicare Other | Admitting: Vascular Surgery

## 2013-01-27 ENCOUNTER — Encounter: Payer: Self-pay | Admitting: Vascular Surgery

## 2013-01-27 DIAGNOSIS — I6529 Occlusion and stenosis of unspecified carotid artery: Secondary | ICD-10-CM

## 2013-01-27 DIAGNOSIS — Z48812 Encounter for surgical aftercare following surgery on the circulatory system: Secondary | ICD-10-CM

## 2013-01-27 NOTE — Addendum Note (Signed)
Addended by: Dorthula Rue L on: 01/27/2013 10:43 AM   Modules accepted: Orders

## 2013-01-27 NOTE — Progress Notes (Signed)
The patient presents today for followup of his recent right carotid endarterectomy for severe asymptomatic carotid stenosis. He did well in the hospital after his surgery on 01/05/2013 and was discharged home on postoperative day #1. He has had no neurologic deficits and surgery. He does have a moderate amount of hypersensitivity around the area of the incision and his right ear lobe.   On physical exam his carotid incision is healing quite nicely with minimal swelling. He has no carotid bruits bilaterally.  Impression and plan: Stable followup after carotid endarterectomy for severe asymptomatic carotid disease. He will continue his full activities without limitations. We will see him again in 6 months with repeat carotid duplex. He will notify should he develop any wound problems or neurologic deficits.

## 2013-02-16 DIAGNOSIS — Z6833 Body mass index (BMI) 33.0-33.9, adult: Secondary | ICD-10-CM | POA: Diagnosis not present

## 2013-02-16 DIAGNOSIS — R42 Dizziness and giddiness: Secondary | ICD-10-CM | POA: Diagnosis not present

## 2013-03-10 DIAGNOSIS — Z23 Encounter for immunization: Secondary | ICD-10-CM | POA: Diagnosis not present

## 2013-04-06 ENCOUNTER — Encounter: Payer: Self-pay | Admitting: Cardiology

## 2013-04-06 DIAGNOSIS — I4891 Unspecified atrial fibrillation: Secondary | ICD-10-CM | POA: Diagnosis not present

## 2013-04-06 DIAGNOSIS — E119 Type 2 diabetes mellitus without complications: Secondary | ICD-10-CM | POA: Diagnosis not present

## 2013-04-06 DIAGNOSIS — Z125 Encounter for screening for malignant neoplasm of prostate: Secondary | ICD-10-CM | POA: Diagnosis not present

## 2013-04-06 DIAGNOSIS — I2510 Atherosclerotic heart disease of native coronary artery without angina pectoris: Secondary | ICD-10-CM | POA: Diagnosis not present

## 2013-04-06 DIAGNOSIS — M109 Gout, unspecified: Secondary | ICD-10-CM | POA: Diagnosis not present

## 2013-04-06 DIAGNOSIS — Z1331 Encounter for screening for depression: Secondary | ICD-10-CM | POA: Diagnosis not present

## 2013-04-06 DIAGNOSIS — D509 Iron deficiency anemia, unspecified: Secondary | ICD-10-CM | POA: Diagnosis not present

## 2013-04-06 DIAGNOSIS — Z9181 History of falling: Secondary | ICD-10-CM | POA: Diagnosis not present

## 2013-04-06 DIAGNOSIS — E785 Hyperlipidemia, unspecified: Secondary | ICD-10-CM | POA: Diagnosis not present

## 2013-04-29 DIAGNOSIS — J209 Acute bronchitis, unspecified: Secondary | ICD-10-CM | POA: Diagnosis not present

## 2013-04-29 DIAGNOSIS — J019 Acute sinusitis, unspecified: Secondary | ICD-10-CM | POA: Diagnosis not present

## 2013-06-04 DIAGNOSIS — H8110 Benign paroxysmal vertigo, unspecified ear: Secondary | ICD-10-CM

## 2013-06-04 DIAGNOSIS — G4733 Obstructive sleep apnea (adult) (pediatric): Secondary | ICD-10-CM

## 2013-06-04 DIAGNOSIS — J449 Chronic obstructive pulmonary disease, unspecified: Secondary | ICD-10-CM

## 2013-06-04 HISTORY — DX: Benign paroxysmal vertigo, unspecified ear: H81.10

## 2013-06-04 HISTORY — DX: Obstructive sleep apnea (adult) (pediatric): G47.33

## 2013-06-04 HISTORY — DX: Chronic obstructive pulmonary disease, unspecified: J44.9

## 2013-06-26 ENCOUNTER — Encounter (HOSPITAL_COMMUNITY): Payer: Self-pay | Admitting: Emergency Medicine

## 2013-06-26 ENCOUNTER — Observation Stay (HOSPITAL_COMMUNITY)
Admission: EM | Admit: 2013-06-26 | Discharge: 2013-06-27 | Disposition: A | Discharge status: Home / Self Care | Payer: Medicare Other | Attending: Cardiology | Admitting: Cardiology

## 2013-06-26 ENCOUNTER — Emergency Department (HOSPITAL_COMMUNITY): Payer: Medicare Other

## 2013-06-26 DIAGNOSIS — F329 Major depressive disorder, single episode, unspecified: Secondary | ICD-10-CM | POA: Insufficient documentation

## 2013-06-26 DIAGNOSIS — Z951 Presence of aortocoronary bypass graft: Secondary | ICD-10-CM | POA: Insufficient documentation

## 2013-06-26 DIAGNOSIS — I252 Old myocardial infarction: Secondary | ICD-10-CM | POA: Insufficient documentation

## 2013-06-26 DIAGNOSIS — F411 Generalized anxiety disorder: Secondary | ICD-10-CM | POA: Insufficient documentation

## 2013-06-26 DIAGNOSIS — I739 Peripheral vascular disease, unspecified: Secondary | ICD-10-CM | POA: Diagnosis not present

## 2013-06-26 DIAGNOSIS — Z881 Allergy status to other antibiotic agents status: Secondary | ICD-10-CM | POA: Diagnosis not present

## 2013-06-26 DIAGNOSIS — E1169 Type 2 diabetes mellitus with other specified complication: Secondary | ICD-10-CM | POA: Diagnosis present

## 2013-06-26 DIAGNOSIS — R0789 Other chest pain: Secondary | ICD-10-CM | POA: Diagnosis not present

## 2013-06-26 DIAGNOSIS — D649 Anemia, unspecified: Secondary | ICD-10-CM | POA: Insufficient documentation

## 2013-06-26 DIAGNOSIS — I701 Atherosclerosis of renal artery: Secondary | ICD-10-CM | POA: Diagnosis present

## 2013-06-26 DIAGNOSIS — E119 Type 2 diabetes mellitus without complications: Secondary | ICD-10-CM | POA: Insufficient documentation

## 2013-06-26 DIAGNOSIS — Z87448 Personal history of other diseases of urinary system: Secondary | ICD-10-CM | POA: Diagnosis not present

## 2013-06-26 DIAGNOSIS — Z8701 Personal history of pneumonia (recurrent): Secondary | ICD-10-CM | POA: Insufficient documentation

## 2013-06-26 DIAGNOSIS — Z9981 Dependence on supplemental oxygen: Secondary | ICD-10-CM | POA: Diagnosis not present

## 2013-06-26 DIAGNOSIS — R079 Chest pain, unspecified: Secondary | ICD-10-CM | POA: Diagnosis not present

## 2013-06-26 DIAGNOSIS — R011 Cardiac murmur, unspecified: Secondary | ICD-10-CM | POA: Insufficient documentation

## 2013-06-26 DIAGNOSIS — Z8711 Personal history of peptic ulcer disease: Secondary | ICD-10-CM | POA: Insufficient documentation

## 2013-06-26 DIAGNOSIS — I4891 Unspecified atrial fibrillation: Secondary | ICD-10-CM | POA: Insufficient documentation

## 2013-06-26 DIAGNOSIS — E785 Hyperlipidemia, unspecified: Secondary | ICD-10-CM | POA: Diagnosis not present

## 2013-06-26 DIAGNOSIS — M109 Gout, unspecified: Secondary | ICD-10-CM | POA: Diagnosis present

## 2013-06-26 DIAGNOSIS — Z79899 Other long term (current) drug therapy: Secondary | ICD-10-CM | POA: Insufficient documentation

## 2013-06-26 DIAGNOSIS — I482 Chronic atrial fibrillation, unspecified: Secondary | ICD-10-CM | POA: Diagnosis present

## 2013-06-26 DIAGNOSIS — J4489 Other specified chronic obstructive pulmonary disease: Secondary | ICD-10-CM | POA: Insufficient documentation

## 2013-06-26 DIAGNOSIS — Z7902 Long term (current) use of antithrombotics/antiplatelets: Secondary | ICD-10-CM | POA: Insufficient documentation

## 2013-06-26 DIAGNOSIS — E1159 Type 2 diabetes mellitus with other circulatory complications: Secondary | ICD-10-CM | POA: Diagnosis present

## 2013-06-26 DIAGNOSIS — I6529 Occlusion and stenosis of unspecified carotid artery: Secondary | ICD-10-CM | POA: Diagnosis present

## 2013-06-26 DIAGNOSIS — K219 Gastro-esophageal reflux disease without esophagitis: Secondary | ICD-10-CM | POA: Diagnosis not present

## 2013-06-26 DIAGNOSIS — M25579 Pain in unspecified ankle and joints of unspecified foot: Secondary | ICD-10-CM | POA: Diagnosis not present

## 2013-06-26 DIAGNOSIS — F3289 Other specified depressive episodes: Secondary | ICD-10-CM | POA: Insufficient documentation

## 2013-06-26 DIAGNOSIS — I2510 Atherosclerotic heart disease of native coronary artery without angina pectoris: Secondary | ICD-10-CM | POA: Diagnosis present

## 2013-06-26 DIAGNOSIS — R42 Dizziness and giddiness: Secondary | ICD-10-CM | POA: Diagnosis present

## 2013-06-26 DIAGNOSIS — IMO0002 Reserved for concepts with insufficient information to code with codable children: Secondary | ICD-10-CM | POA: Insufficient documentation

## 2013-06-26 DIAGNOSIS — Z9889 Other specified postprocedural states: Secondary | ICD-10-CM | POA: Diagnosis not present

## 2013-06-26 DIAGNOSIS — G4730 Sleep apnea, unspecified: Secondary | ICD-10-CM | POA: Insufficient documentation

## 2013-06-26 DIAGNOSIS — R000 Tachycardia, unspecified: Secondary | ICD-10-CM | POA: Diagnosis present

## 2013-06-26 DIAGNOSIS — Z7901 Long term (current) use of anticoagulants: Secondary | ICD-10-CM

## 2013-06-26 DIAGNOSIS — J449 Chronic obstructive pulmonary disease, unspecified: Secondary | ICD-10-CM | POA: Diagnosis not present

## 2013-06-26 DIAGNOSIS — I359 Nonrheumatic aortic valve disorder, unspecified: Secondary | ICD-10-CM | POA: Diagnosis present

## 2013-06-26 DIAGNOSIS — I059 Rheumatic mitral valve disease, unspecified: Secondary | ICD-10-CM | POA: Insufficient documentation

## 2013-06-26 DIAGNOSIS — I10 Essential (primary) hypertension: Secondary | ICD-10-CM | POA: Diagnosis not present

## 2013-06-26 DIAGNOSIS — Z87891 Personal history of nicotine dependence: Secondary | ICD-10-CM | POA: Insufficient documentation

## 2013-06-26 HISTORY — DX: Tachycardia, unspecified: R00.0

## 2013-06-26 LAB — BASIC METABOLIC PANEL
BUN: 9 mg/dL (ref 6–23)
CALCIUM: 9.1 mg/dL (ref 8.4–10.5)
CO2: 27 meq/L (ref 19–32)
Chloride: 100 mEq/L (ref 96–112)
Creatinine, Ser: 0.82 mg/dL (ref 0.50–1.35)
GFR calc Af Amer: >90 mL/min (ref >90)
GFR, EST NON AFRICAN AMERICAN: 82 mL/min — AB (ref >90)
GLUCOSE: 205 mg/dL — AB (ref 70–99)
Potassium: 4.3 mEq/L (ref 3.7–5.3)
Sodium: 140 mEq/L (ref 137–147)

## 2013-06-26 LAB — CBC
HEMATOCRIT: 42.8 % (ref 39.0–52.0)
HEMOGLOBIN: 15.1 g/dL (ref 13.0–17.0)
MCH: 31.3 pg (ref 26.0–34.0)
MCHC: 35.3 g/dL (ref 30.0–36.0)
MCV: 88.8 fL (ref 78.0–100.0)
Platelets: 210 10*3/uL (ref 150–400)
RBC: 4.82 MIL/uL (ref 4.22–5.81)
RDW: 14.2 % (ref 11.5–15.5)
WBC: 6.4 10*3/uL (ref 4.0–10.5)

## 2013-06-26 LAB — POCT I-STAT TROPONIN I: Troponin i, poc: 0 ng/mL (ref 0.00–0.08)

## 2013-06-26 LAB — GLUCOSE, CAPILLARY: GLUCOSE-CAPILLARY: 260 mg/dL — AB (ref 70–99)

## 2013-06-26 LAB — TROPONIN I

## 2013-06-26 MED ORDER — ACYCLOVIR 400 MG PO TABS
400.0000 mg | ORAL_TABLET | Freq: Two times a day (BID) | ORAL | Status: DC
  Administered 2013-06-26 – 2013-06-27 (×2): 400 mg via ORAL
  Filled 2013-06-26 (×5): qty 1

## 2013-06-26 MED ORDER — SODIUM CHLORIDE 0.9 % IJ SOLN
3.0000 mL | Freq: Two times a day (BID) | INTRAMUSCULAR | Status: DC
  Administered 2013-06-26 – 2013-06-27 (×2): 3 mL via INTRAVENOUS

## 2013-06-26 MED ORDER — FERROUS SULFATE 325 (65 FE) MG PO TABS
325.0000 mg | ORAL_TABLET | Freq: Every day | ORAL | Status: DC
  Administered 2013-06-27: 325 mg via ORAL
  Filled 2013-06-26 (×2): qty 1

## 2013-06-26 MED ORDER — PREDNISONE 20 MG PO TABS
40.0000 mg | ORAL_TABLET | Freq: Every day | ORAL | Status: DC
  Administered 2013-06-27: 40 mg via ORAL
  Filled 2013-06-26 (×2): qty 2

## 2013-06-26 MED ORDER — METHOCARBAMOL 750 MG PO TABS
750.0000 mg | ORAL_TABLET | Freq: Two times a day (BID) | ORAL | Status: DC
  Administered 2013-06-26 – 2013-06-27 (×2): 750 mg via ORAL
  Filled 2013-06-26 (×3): qty 1

## 2013-06-26 MED ORDER — VITAMIN D 1000 UNITS PO TABS
2000.0000 [IU] | ORAL_TABLET | Freq: Every day | ORAL | Status: DC
  Administered 2013-06-27: 2000 [IU] via ORAL
  Filled 2013-06-26: qty 2

## 2013-06-26 MED ORDER — METOPROLOL TARTRATE 50 MG PO TABS
50.0000 mg | ORAL_TABLET | Freq: Two times a day (BID) | ORAL | Status: DC
Start: 2013-06-26 — End: 2013-06-27
  Administered 2013-06-26 – 2013-06-27 (×2): 50 mg via ORAL
  Filled 2013-06-26 (×3): qty 1

## 2013-06-26 MED ORDER — DABIGATRAN ETEXILATE MESYLATE 150 MG PO CAPS
150.0000 mg | ORAL_CAPSULE | Freq: Two times a day (BID) | ORAL | Status: DC
  Administered 2013-06-26 – 2013-06-27 (×2): 150 mg via ORAL
  Filled 2013-06-26 (×3): qty 1

## 2013-06-26 MED ORDER — ACETAMINOPHEN 500 MG PO TABS: 500.0000 mg | ORAL_TABLET | Freq: Four times a day (QID) | ORAL | Status: DC | PRN

## 2013-06-26 MED ORDER — PREDNISONE 20 MG PO TABS: 20.0000 mg | ORAL_TABLET | ORAL | Status: DC

## 2013-06-26 MED ORDER — PANTOPRAZOLE SODIUM 20 MG PO TBEC
20.0000 mg | DELAYED_RELEASE_TABLET | Freq: Every day | ORAL | Status: DC
  Administered 2013-06-27: 20 mg via ORAL
  Filled 2013-06-26: qty 1

## 2013-06-26 MED ORDER — ALBUTEROL 90 MCG/ACT IN AERS: 2.0000 | INHALATION_SPRAY | RESPIRATORY_TRACT | Status: DC | PRN

## 2013-06-26 MED ORDER — BUDESONIDE-FORMOTEROL FUMARATE 80-4.5 MCG/ACT IN AERO: 2.0000 | INHALATION_SPRAY | Freq: Two times a day (BID) | RESPIRATORY_TRACT | Status: DC | PRN

## 2013-06-26 MED ORDER — FUROSEMIDE 20 MG PO TABS
20.0000 mg | ORAL_TABLET | Freq: Every day | ORAL | Status: DC
Start: 2013-06-27 — End: 2013-06-27
  Administered 2013-06-27: 20 mg via ORAL
  Filled 2013-06-26: qty 1

## 2013-06-26 MED ORDER — TERAZOSIN HCL 5 MG PO CAPS
5.0000 mg | ORAL_CAPSULE | Freq: Every day | ORAL | Status: DC
Start: 2013-06-26 — End: 2013-06-27
  Administered 2013-06-26: 5 mg via ORAL
  Filled 2013-06-26 (×2): qty 1

## 2013-06-26 MED ORDER — BENAZEPRIL HCL 10 MG PO TABS
10.0000 mg | ORAL_TABLET | Freq: Every day | ORAL | Status: DC
  Administered 2013-06-27: 10 mg via ORAL
  Filled 2013-06-26: qty 1

## 2013-06-26 MED ORDER — SIMVASTATIN 40 MG PO TABS
40.0000 mg | ORAL_TABLET | Freq: Every day | ORAL | Status: DC
  Administered 2013-06-26: 40 mg via ORAL
  Filled 2013-06-26 (×2): qty 1

## 2013-06-26 MED ORDER — IPRATROPIUM-ALBUTEROL 0.5-2.5 (3) MG/3ML IN SOLN: 3.0000 mL | Freq: Three times a day (TID) | RESPIRATORY_TRACT | Status: DC | PRN

## 2013-06-26 NOTE — ED Notes (Addendum)
Pt from home with c/o chest pain 1/10, starting this morning at 6 am and dizziness with sitting and standing.  Pt also c/o right ankle pain; hx of gout.  Hx of MI x2 and CABG.  Given 324 mg aspirin. Pt in NAD, A&O.

## 2013-06-26 NOTE — ED Notes (Signed)
Patient transported to X-ray 

## 2013-06-26 NOTE — H&P (Signed)
Chief Complaint: Chest Pain  HPI:  The patient is a 80 y/o male, followed by Dr. Stanford Breed with a history of coronary artery disease, status post coronary artery bypassing graft in 1994, permanent atrial fibrillation and peripheral vascular disease (renal artery stenosis and cerebrovascular disease). He is on chronic anticoagulation with Pradaxa for his atrial fibrillation. His last Myoview was performed in May of 2014. There was no definite scar or ischemia. There was slight decreased activity in the inferior wall that was felt to be likely from attenuation. The wall motion abnormality was thought to probably be related to prior CABG. It was ultimately determined to be a low risk scan. Echocardiogram in June 2013 showed normal LV function with an EF of 50-55% and mild left ventricular hypertrophy, mild aortic stenosis/aortic insufficiency, mild mitral regurgitation and moderate left atrial enlargement. Renal Dopplers were performed in June 2013 and revealed 1-59% bilateral stenosis. Last carotid Dopplers in November of 2013 showed 60-79% bilateral stenosis. His last office visit with Dr. Stanford Breed was in May 2014 and at that time, he was stable from a cardiac standpoint and was instructed to return in a year for repeat follow-up.  He presents to the [facility] ER today with a complaint of chest pain and dizziness that first began today. He was in his usual state of health until this am. He woke up with a gout flare in his right ankle. After taking his morning meds, he started to feel dizzy. He denies any syncope or near syncope. His wife, who is a retired Therapist, sports checked his BP and it was stable w/ SBP in the 140s-150s. His HR was also stable in the 80s-90s. His dizziness improved after sitting. He notes associated left-sided chest tightness. Non radiating. Non exertional. Non positional. No aggravating factors. No associated SOB, n/v or diaphoresis. He had a similar presentation when he suffered his two MIs in 1994, before  undergoing CABG. He reports daily compliance with all of his home medications. He has not had any chest discomfort since today. He states that his chest pain was relieved after EMS gave him ASA and administered oxygen. He has had no recurrence in symptoms since arriving to ER.  Past Medical History   Diagnosis  Date   .  History of peptic ulcer disease    .  Anemia    .  Hyperlipidemia    .  Aortic stenosis    .  Peripheral vascular disease    .  Atrial fibrillation    .  Carotid stenosis    .  Renal artery stenosis    .  CAD (coronary artery disease)    .  Hypertension    .  Myocardial infarction      X2   .  COPD (chronic obstructive pulmonary disease)    .  Pneumonia      HX OF   .  Sleep apnea      OXYGEN AT NIGHT 2L [place]   .  Diabetes mellitus without complication      FASTING 98-120S   .  GERD (gastroesophageal reflux disease)    .  History of blood transfusion    .  Depression    .  Anxiety     Past Surgical History   Procedure  Laterality  Date   .  Coronary artery bypass graft   1994   .  Renal artery stent       stenting of the left renal artery as well as a cutting  balloon angioplasty for treatment of in-stent restenosis coronary artery bypass grafting in 1994.   .  Knee surgery   08/2003     left knee/ARTHROSCOPIC   .  Removal of bleeding ulcer   1994   .  Endarterectomy  Right  01/05/2013     Procedure: ENDARTERECTOMY CAROTID; Surgeon: Todd F Early, MD; Location: [facility] OR; Service: Vascular; Laterality: Right;   .  Patch angioplasty  Right  01/05/2013     Procedure: PATCH ANGIOPLASTY; Surgeon: Todd F Early, MD; Location: [facility] OR; Service: Vascular; Laterality: Right;   .  Carotid endarterectomy      Family History   Problem  Relation  Age of Onset   .  Coronary artery disease  Mother    .  Heart disease  Mother    .  Coronary artery disease  Father    .  Heart disease  Father    .  Coronary artery disease       13 sibling, almost all have coronary disease and some  with premature onset   .  Heart disease       13 sibling, almost all have coronary disease and some with premature onset    Social History: reports that he quit smoking about 20 years ago. He does not have any smokeless tobacco history on file. He reports that he does not drink alcohol or use illicit drugs.  Allergies:  Allergies   Allergen  Reactions   .  Bactrim [Sulfamethoxazole-Tmp Ds]  Other (See Comments)     "does not do anything for me"     (Not in a hospital admission)  Results for orders placed during the hospital encounter of 06/26/13 (from the past 48 hour(s))   CBC Status: None    Collection Time    06/26/13 1:56 PM   Result  Value  Range    WBC  6.4  4.0 - 10.5 K/uL    RBC  4.82  4.22 - 5.81 MIL/uL    Hemoglobin  15.1  13.0 - 17.0 g/dL    HCT  42.8  39.0 - 52.0 %    MCV  88.8  78.0 - 100.0 fL    MCH  31.3  26.0 - 34.0 pg    MCHC  35.3  30.0 - 36.0 g/dL    RDW  14.2  11.5 - 15.5 %    Platelets  210  150 - 400 K/uL   BASIC METABOLIC PANEL Status: Abnormal    Collection Time    06/26/13 1:56 PM   Result  Value  Range    Sodium  140  137 - 147 mEq/L    Potassium  4.3  3.7 - 5.3 mEq/L    Chloride  100  96 - 112 mEq/L    CO2  27  19 - 32 mEq/L    Glucose, Bld  205 (*)  70 - 99 mg/dL    BUN  9  6 - 23 mg/dL    Creatinine, Ser  0.82  0.50 - 1.35 mg/dL    Calcium  9.1  8.4 - 10.5 mg/dL    GFR calc non Af Amer  82 (*)  >90 mL/min    GFR calc Af Amer  >90  >90 mL/min    Comment:  (NOTE)     The eGFR has been calculated using the CKD EPI equation.     This calculation has not been validated in all clinical situations.     eGFR's   persistently <90 mL/min signify possible Chronic Kidney     Disease.   POCT I-STAT TROPONIN I Status: None    Collection Time    06/26/13 2:00 PM   Result  Value  Range    Troponin i, poc  0.00  0.00 - 0.08 ng/mL    Comment 3      Comment:  Due to the release kinetics of cTnI,     a negative result within the first hours     of the  onset of symptoms does not rule out     myocardial infarction with certainty.     If myocardial infarction is still suspected,     repeat the test at appropriate intervals.    Dg Chest 2 View  06/26/2013 CLINICAL DATA: Chest pain and dizziness. EXAM: CHEST 2 VIEW COMPARISON: 12/23/2012 FINDINGS: Sequelae of prior CABG are again identified. The cardiac silhouette remains mildly enlarged. The lungs remain hyperinflated without evidence of airspace consolidation, edema, pleural effusion, or pneumothorax. The bones appear osteopenic. No acute osseous abnormality is identified. IMPRESSION: No active cardiopulmonary disease. Electronically Signed By: Logan Bores On: 06/26/2013 15:09   Review of Systems  Constitutional: Negative for diaphoresis.  Respiratory: Negative for shortness of breath.  Cardiovascular: Positive for chest pain. Negative for palpitations, orthopnea, leg swelling and PND.  Gastrointestinal: Negative for nausea and vomiting.  Neurological: Positive for dizziness. Negative for loss of consciousness.  All other systems reviewed and are negative.   Blood pressure 148/102, pulse 94, temperature 98.7 F (37.1 C), temperature source Oral, resp. rate 17, weight 242 lb (109.77 kg), SpO2 97.00%.  Physical Exam  Constitutional: He is oriented to person, place, and time. He appears well-developed and well-nourished. No distress.  Neck: JVD (slight) present. Carotid bruit is present.  Cardiovascular: Normal rate. An irregularly irregular rhythm present. Exam reveals no gallop and no friction rub.  Murmur (2/6 SM heard throughout the precordium) heard.  Pulses:  Radial pulses are 2+ on the right side, and 2+ on the left side.  Dorsalis pedis pulses are 2+ on the right side, and 2+ on the left side.  Respiratory: Effort normal and breath sounds normal. No respiratory distress. He has no wheezes. He has no rales. He exhibits no tenderness.  GI: Soft. Bowel sounds are normal. He exhibits no  distension and no mass. There is no tenderness.  Musculoskeletal: He exhibits no edema.  Neurological: He is alert and oriented to person, place, and time.  Skin: Skin is warm and dry. He is not diaphoretic.  Psychiatric: He has a normal mood and affect. His behavior is normal.   Assessment/Plan  Active Problems:  Chest pain  Dizziness   Plan: 80 y/o male with h/o of CAD, s/p CABG x 3 in 1994, chronic rate controlled atrial fibrillation, PVD and mild AS/AR and MR, presents for evaluation of left sided chest tightness and dizziness. He is currently asymptomatic. His Afib remains rate controlled on telemetry. BP is stable. POC troponin is negative x 1. His EKG shows no significant ischemic changes. CXR demonstrates no active cardiopulmonary disease. He recently had a low risk NST in May 2014. EF at that time was 50%. BNP is slightly elevated at 160, but no significant peripheral edema.   During Dr. Doug Sou examination, the patient's HR increased in the 130s. We will plan to admit and will increase his Lopressor to BID. Will place on telemetry and will cycle troponins. Will also order a U/A.    SIMMONS, BRITTAINY  06/26/2013, 4:06 PM    Patient seen and examined and history reviewed. Agree with above findings and plan. Pleasant 80 yo WM with above history presents for evaluation of dizziness and chest pressure. Currently symptoms resolved. Started steroids this am for gout flare. Initial evaluation unremarkable with normal troponin and Ecg is unchanged. While taking orthostatics patient's HR increased to 130 with just standing and BP increased to 180 systolic. Normally pulse is under good control. Will admit for observation. Increase metoprolol for better rate and BP control. Check UA to r/o UTI and cycle cardiac enzymes. I doubt his symptoms are ischemic. Continue steroid taper for gout. Possible DC home tomorrow if improved.  Peter JordanMD,FACC 06/26/2013 4:35 PM    

## 2013-06-26 NOTE — ED Provider Notes (Signed)
CSN: [id number]     Arrival date & time 06/26/13  1331 History   First MD Initiated Contact with Patient 06/26/13 1332     Chief Complaint  Patient presents with  . Chest Pain   (Consider location/radiation/quality/duration/timing/severity/associated sxs/prior Treatment) HPI Comments: 80 year old male presents with several hours of dizziness and chest pain. Patient states that he woke up and then after about an hour when he got his coffee he had acute onset of chest tightness and dizziness. Days his head is "woozy". Patient denies any vertigo or spinning. No weakness numbness or headaches. The patient states that he denies any shortness of breath. He follows with Dr. Stanford Breed because she's had a CABG and multiple MIs. Does not particularly feel at times that heart issues. The patient denies any symptoms at this moment. His symptoms resolved prior to arrival.   Past Medical History  Diagnosis Date  . History of peptic ulcer disease   . Anemia   . Hyperlipidemia   . Aortic stenosis    . Peripheral vascular disease   . Atrial fibrillation   . Carotid stenosis   . Renal artery stenosis   . CAD (coronary artery disease)   . Hypertension   . Myocardial infarction     X2  . COPD (chronic obstructive pulmonary disease)   . Pneumonia     HX OF  . Sleep apnea     OXYGEN AT NIGHT 2L [place]  . Diabetes mellitus without complication     FASTING 98-120S  . GERD (gastroesophageal reflux disease)   . History of blood transfusion   . Depression   . Anxiety    Past Surgical History  Procedure Laterality Date  . Coronary artery bypass graft  1994  . Renal artery stent      stenting of the left renal artery as well as a cutting balloon angioplasty for treatment of in-stent restenosis coronary artery bypass grafting in 1994.  [name] Knee surgery  08/2003    left knee/ARTHROSCOPIC  . Removal of bleeding ulcer  1994  . Endarterectomy Right 01/05/2013    Procedure: ENDARTERECTOMY CAROTID;  Surgeon: Rosetta Posner, MD;  Location: [facility];  Service: Vascular;  Laterality: Right;  . Patch angioplasty Right 01/05/2013    Procedure: PATCH ANGIOPLASTY;  Surgeon: Rosetta Posner, MD;  Location: [facility] OR;  Service: Vascular;  Laterality: Right;  . Carotid endarterectomy     Family History  Problem Relation Age of Onset  . Coronary artery disease Mother   . Heart disease Mother   . Coronary artery disease Father   . Heart disease Father   . Coronary artery disease      13 sibling, almost all have coronary disease and some with premature onset  . Heart disease      13 sibling, almost all have coronary disease and some with premature onset   History  Substance Use Topics  . Smoking status: Former Smoker    Quit date: 04/27/1993  . Smokeless tobacco: Not on file  . Alcohol Use: No    Review of Systems  Respiratory: Positive for chest tightness. Negative for cough and shortness of breath.   Cardiovascular: Positive for chest pain.  Gastrointestinal: Negative for nausea, vomiting, abdominal pain and diarrhea.  Musculoskeletal: Negative for back pain.  Neurological: Positive for light-headedness. Negative for weakness, numbness and headaches.  All other systems reviewed and are negative.    Allergies  Bactrim  Home Medications   Current Outpatient Rx  Name  Route  Sig  Dispense  Refill  . acetaminophen (TYLENOL) 500 MG tablet   Oral   Take 500 mg by mouth every 6 (six) hours as needed for pain.          [name] acetaminophen-codeine (TYLENOL #3) 300-30 MG per tablet   Oral   Take 1 tablet by mouth at bedtime as needed for pain.         [name] acyclovir (ZOVIRAX) 800 MG tablet   Oral   Take 400 mg by mouth 2 (two) times daily.          [name] albuterol (PROVENTIL,VENTOLIN) 90 MCG/ACT inhaler   Inhalation   Inhale 2 puffs into the lungs every 4 (four) hours as needed for shortness of breath.          . allopurinol (ZYLOPRIM) 300 MG tablet   Oral   Take 300 mg by mouth daily.           [name]  azelastine (ASTELIN) 137 MCG/SPRAY nasal spray   Each Nare   Place 2 sprays into both nostrils daily.          . benazepril (LOTENSIN) 20 MG tablet   Oral   Take 10 mg by mouth daily.          . budesonide-formoterol (SYMBICORT) 80-4.5 MCG/ACT inhaler   Inhalation   Inhale 2 puffs into the lungs 2 (two) times daily as needed (bronchitis).         [name] buPROPion (WELLBUTRIN SR) 150 MG 12 hr tablet   Oral   Take 150 mg by mouth daily as needed (anxiety).          . Cholecalciferol (VITAMIN D) 2000 UNITS CAPS   Oral   Take 2,000 Units by mouth daily.         . dabigatran (PRADAXA) 150 MG CAPS   Oral   Take 150 mg by mouth 2 (two) times daily.          . ferrous sulfate 325 (65 FE) MG tablet   Oral   Take 325 mg by mouth daily with breakfast.          . furosemide (LASIX) 20 MG tablet   Oral   Take 20 mg by mouth daily.           [name] ipratropium-albuterol (DUONEB) 0.5-2.5 (3) MG/3ML SOLN   Nebulization   Take 3 mLs by nebulization 3 (three) times daily as needed (for bronchitis or other virus that affects breathing).          . lansoprazole (PREVACID) 30 MG capsule   Oral   Take 30 mg by mouth daily.           . methocarbamol (ROBAXIN) 750 MG tablet   Oral   Take 750 mg by mouth 2 (two) times daily.          . metoprolol (LOPRESSOR) 50 MG tablet   Oral   Take 25 mg by mouth 2 (two) times daily.          . Multiple Vitamin (MULTIVITAMIN WITH MINERALS) TABS   Oral   Take 1 tablet by mouth daily.         . multivitamin-lutein (OCUVITE-LUTEIN) CAPS   Oral   Take 1 capsule by mouth daily.         . predniSONE (DELTASONE) 20 MG tablet   Oral   Take 20 mg by mouth See admin instructions. Tapered dose as needed for gout attacks         .  simvastatin (ZOCOR) 40 MG tablet   Oral   Take 1 tablet (40 mg total) by mouth at bedtime.   90 tablet   3   . terazosin (HYTRIN) 5 MG capsule   Oral   Take 5 mg by mouth at bedtime.          .  trolamine salicylate (ASPERCREME) 10 % cream   Topical   Apply 1 application topically 4 (four) times daily as needed (pain).           BP 148/102  Pulse 94  Temp(Src) 98.7 F (37.1 C) (Oral)  Resp 17  Wt 242 lb (109.77 kg)  SpO2 97% Physical Exam  Nursing note and vitals reviewed. Constitutional: He is oriented to person, place, and time. He appears well-developed and well-nourished.  HENT:  Head: Normocephalic and atraumatic.  Right Ear: External ear normal.  Left Ear: External ear normal.  Nose: Nose normal.  Mouth/Throat: Oropharynx is clear and moist.  Eyes: Right eye exhibits no discharge. Left eye exhibits no discharge.  Neck: Neck supple.  Cardiovascular: Normal rate, regular rhythm and intact distal pulses.   Murmur heard. Pulmonary/Chest: Effort normal and breath sounds normal. He has no wheezes. He has no rales.  Abdominal: Soft. There is no tenderness.  Musculoskeletal: He exhibits no edema.  Neurological: He is alert and oriented to person, place, and time. He has normal strength. No sensory deficit.  Skin: Skin is warm and dry.    ED Course  Procedures (including critical care time) Labs Review Labs Reviewed  BASIC METABOLIC PANEL - Abnormal; Notable for the following:    Glucose, Bld 205 (*)    GFR calc non Af Amer 82 (*)    All other components within normal limits  CBC  POCT I-STAT TROPONIN I   Imaging Review Dg Chest 2 View  06/26/2013   CLINICAL DATA:  Chest pain and dizziness.  EXAM: CHEST  2 VIEW  COMPARISON:  12/23/2012  FINDINGS: Sequelae of prior CABG are again identified. The cardiac silhouette remains mildly enlarged. The lungs remain hyperinflated without evidence of airspace consolidation, edema, pleural effusion, or pneumothorax. The bones appear osteopenic. No acute osseous abnormality is identified.  IMPRESSION: No active cardiopulmonary disease.   Electronically Signed   By: Logan Bores   On: 06/26/2013 15:09    EKG Interpretation     Date/Time:  Friday June 26 2013 13:36:31 EST Ventricular Rate:  102 PR Interval:    QRS Duration: 89 QT Interval:  363 QTC Calculation: 473 R Axis:   -43 Text Interpretation:  Atrial fibrillation Ventricular premature complex Inferior infarct, old Anteroseptal infarct, old Lateral leads are also involved No significant change since last tracing Confirmed by Ketsia Linebaugh  MD, [place] ([id number]) on 06/26/2013 3:14:42 PM            MDM   1. Chest pain   2. Atrial fibrillation   3. Dizziness   4. Essential hypertension, benign    Patient is well appearing, and currently symptom free. Given his dizziness was associated with chest symptoms, ACS is of concern. Unlikely to be PE given intermittent nature and now resolved w/o treatment. D/w cards, they will admit.    Ephraim Hamburger, MD 06/26/13 [phone]

## 2013-06-26 NOTE — Consult Note (Cosign Needed)
Chief Complaint: Chest Pain   HPI:  The patient is a 80 y/o male, followed by Dr. Stanford Breed with a history of coronary artery disease, status post coronary artery bypassing graft in 1994, permanent atrial fibrillation and peripheral vascular disease (renal artery stenosis and cerebrovascular disease). He is on chronic anticoagulation with Pradaxa for his atrial fibrillation. His last Myoview was performed in May of 2014. There was no definite scar or ischemia. There was slight decreased activity in the inferior wall that was felt to be likely from attenuation. The wall motion abnormality was thought to probably be related to prior CABG. It was ultimately determined to be a low risk scan. Echocardiogram in June 2013 showed normal LV function with an EF of 50-55% and mild left ventricular hypertrophy, mild aortic stenosis/aortic insufficiency, mild mitral regurgitation and moderate left atrial enlargement. Renal Dopplers were performed in June 2013 and revealed 1-59% bilateral stenosis. Last carotid Dopplers in November of 2013 showed 60-79% bilateral stenosis. His last office visit with Dr. Stanford Breed was in May 2014 and at that time, he was stable from a cardiac standpoint and was instructed to return in a year for repeat follow-up.   He presents to the [facility] ER today with a complaint of chest pain and dizziness that first began today. He was in his usual state of health until this am. He woke up with a gout flare in his right ankle. After taking his morning meds, he started to feel dizzy. He denies any syncope or near syncope. His wife, who is a retired Therapist, sports checked his BP and it was stable w/ SBP in the 140s-150s. His HR was also stable in the 80s-90s. His dizziness improved after sitting. He notes associated left-sided chest tightness. Non radiating. Non exertional. Non positional. No aggravating factors. No associated SOB, n/v or diaphoresis. He had a similar presentation when he suffered his two MIs in 1994,  before undergoing CABG. He reports daily compliance with all of his home medications. He has not had any chest discomfort since today. He states that his chest pain was relieved after EMS gave him ASA and administered oxygen. He has had no recurrence in symptoms since arriving to ER.  Past Medical History   Diagnosis  Date   .  History of peptic ulcer disease    .  Anemia    .  Hyperlipidemia    .  Aortic stenosis    .  Peripheral vascular disease    .  Atrial fibrillation    .  Carotid stenosis    .  Renal artery stenosis    .  CAD (coronary artery disease)    .  Hypertension    .  Myocardial infarction      X2   .  COPD (chronic obstructive pulmonary disease)    .  Pneumonia      HX OF   .  Sleep apnea      OXYGEN AT NIGHT 2L [place]   .  Diabetes mellitus without complication      FASTING 98-120S   .  GERD (gastroesophageal reflux disease)    .  History of blood transfusion    .  Depression    .  Anxiety     Past Surgical History   Procedure  Laterality  Date   .  Coronary artery bypass graft   1994   .  Renal artery stent       stenting of the left renal artery as well as  a cutting balloon angioplasty for treatment of in-stent restenosis coronary artery bypass grafting in 1994.   [name]  Knee surgery   08/2003     left knee/ARTHROSCOPIC   .  Removal of bleeding ulcer   1994   .  Endarterectomy  Right  01/05/2013     Procedure: ENDARTERECTOMY CAROTID; Surgeon: Rosetta Posner, MD; Location: [place]; Service: Vascular; Laterality: Right;   .  Patch angioplasty  Right  01/05/2013     Procedure: PATCH ANGIOPLASTY; Surgeon: Rosetta Posner, MD; Location: [facility] OR; Service: Vascular; Laterality: Right;   .  Carotid endarterectomy      Family History   Problem  Relation  Age of Onset   .  Coronary artery disease  Mother    .  Heart disease  Mother    .  Coronary artery disease  Father    .  Heart disease  Father    .  Coronary artery disease       13 sibling, almost all have coronary disease and  some with premature onset   .  Heart disease       13 sibling, almost all have coronary disease and some with premature onset    Social History: reports that he quit smoking about 20 years ago. He does not have any smokeless tobacco history on file. He reports that he does not drink alcohol or use illicit drugs.  Allergies:  Allergies   Allergen  Reactions   .  Bactrim [Sulfamethoxazole-Tmp Ds]  Other (See Comments)     "does not do anything for me"     (Not in a hospital admission)  Results for orders placed during the hospital encounter of 06/26/13 (from the past 48 hour(s))   CBC Status: None    Collection Time    06/26/13 1:56 PM   Result  Value  Range    WBC  6.4  4.0 - 10.5 K/uL    RBC  4.82  4.22 - 5.81 MIL/uL    Hemoglobin  15.1  13.0 - 17.0 g/dL    HCT  42.8  39.0 - 52.0 %    MCV  88.8  78.0 - 100.0 fL    MCH  31.3  26.0 - 34.0 pg    MCHC  35.3  30.0 - 36.0 g/dL    RDW  14.2  11.5 - 15.5 %    Platelets  210  150 - 400 K/uL   BASIC METABOLIC PANEL Status: Abnormal    Collection Time    06/26/13 1:56 PM   Result  Value  Range    Sodium  140  137 - 147 mEq/L    Potassium  4.3  3.7 - 5.3 mEq/L    Chloride  100  96 - 112 mEq/L    CO2  27  19 - 32 mEq/L    Glucose, Bld  205 (*)  70 - 99 mg/dL    BUN  9  6 - 23 mg/dL    Creatinine, Ser  0.82  0.50 - 1.35 mg/dL    Calcium  9.1  8.4 - 10.5 mg/dL    GFR calc non Af Amer  82 (*)  >90 mL/min    GFR calc Af Amer  >90  >90 mL/min    Comment:  (NOTE)     The eGFR has been calculated using the CKD EPI equation.     This calculation has not been validated in all clinical situations.  eGFR's persistently <90 mL/min signify possible Chronic Kidney     Disease.   POCT I-STAT TROPONIN I Status: None    Collection Time    06/26/13 2:00 PM   Result  Value  Range    Troponin i, poc  0.00  0.00 - 0.08 ng/mL    Comment 3      Comment:  Due to the release kinetics of cTnI,     a negative result within the first hours     of the  onset of symptoms does not rule out     myocardial infarction with certainty.     If myocardial infarction is still suspected,     repeat the test at appropriate intervals.    Dg Chest 2 View  06/26/2013 CLINICAL DATA: Chest pain and dizziness. EXAM: CHEST 2 VIEW COMPARISON: 12/23/2012 FINDINGS: Sequelae of prior CABG are again identified. The cardiac silhouette remains mildly enlarged. The lungs remain hyperinflated without evidence of airspace consolidation, edema, pleural effusion, or pneumothorax. The bones appear osteopenic. No acute osseous abnormality is identified. IMPRESSION: No active cardiopulmonary disease. Electronically Signed By: Logan Bores On: 06/26/2013 15:09   Review of Systems  Constitutional: Negative for diaphoresis.  Respiratory: Negative for shortness of breath.  Cardiovascular: Positive for chest pain. Negative for palpitations, orthopnea, leg swelling and PND.  Gastrointestinal: Negative for nausea and vomiting.  Neurological: Positive for dizziness. Negative for loss of consciousness.  All other systems reviewed and are negative.   Blood pressure 148/102, pulse 94, temperature 98.7 F (37.1 C), temperature source Oral, resp. rate 17, weight 242 lb (109.77 kg), SpO2 97.00%.  Physical Exam  Constitutional: He is oriented to person, place, and time. He appears well-developed and well-nourished. No distress.  Neck: JVD (slight) present. Carotid bruit is present.  Cardiovascular: Normal rate. An irregularly irregular rhythm present. Exam reveals no gallop and no friction rub.  Murmur (2/6 SM heard throughout the precordium) heard.  Pulses:  Radial pulses are 2+ on the right side, and 2+ on the left side.  Dorsalis pedis pulses are 2+ on the right side, and 2+ on the left side.  Respiratory: Effort normal and breath sounds normal. No respiratory distress. He has no wheezes. He has no rales. He exhibits no tenderness.  GI: Soft. Bowel sounds are normal. He exhibits no  distension and no mass. There is no tenderness.  Musculoskeletal: He exhibits no edema.  Neurological: He is alert and oriented to person, place, and time.  Skin: Skin is warm and dry. He is not diaphoretic.  Psychiatric: He has a normal mood and affect. His behavior is normal.   Assessment/Plan  Active Problems:  Chest pain  Dizziness   Plan: 80 y/o male with h/o of CAD, s/p CABG x 3 in 1994, chronic rate controlled atrial fibrillation, PVD and mild AS/AR and MR, presents for evaluation of left sided chest tightness and dizziness. He is currently asymptomatic. His Afib remains rate controlled on telemetry. BP is stable. POC troponin is negative x 1. His EKG shows no significant ischemic changes. CXR demonstrates no active cardiopulmonary disease. He recently had a low risk NST in May 2014. EF at that time was 50%. BNP is slightly elevated at 160, but no significant peripheral edema.   With no further recurrence of symptoms and recent NST in May 2014, ? direct discharge from ER if second troponin returns normal. MD to assess and will determine.   SIMMONS, BRITTAINY  06/26/2013, 4:06 PM

## 2013-06-26 NOTE — ED Notes (Signed)
Pt returned from xray

## 2013-06-26 NOTE — H&P (Deleted)
Chief Complaint: Chest Pain   HPI:  The patient is a 80 y/o male, followed by Dr. Crenshaw with a history of coronary artery disease, status post coronary artery bypassing graft in 1994, permanent atrial fibrillation and peripheral vascular disease (renal artery stenosis and cerebrovascular disease). He is on chronic anticoagulation with Pradaxa for his atrial fibrillation. His last Myoview was performed in May of 2014. There was no definite scar or ischemia. There was slight decreased activity in the inferior wall that was felt to be likely from attenuation. The wall motion abnormality was thought to probably be related to prior CABG. It was ultimately determined to be a low risk scan. Echocardiogram in June 2013 showed normal LV function with an EF of 50-55% and mild left ventricular hypertrophy, mild aortic stenosis/aortic insufficiency, mild mitral regurgitation and moderate left atrial enlargement. Renal Dopplers were performed in June 2013 and revealed 1-59% bilateral stenosis. Last carotid Dopplers in November of 2013 showed 60-79% bilateral stenosis. His last office visit with Dr. Crenshaw was in May 2014 and at that time, he was stable from a cardiac standpoint and was instructed to return in a year for repeat follow-up.   He presents to the [facility] ER today with a complaint of chest pain and dizziness that first began today. He was in his usual state of health until this am. He woke up with a gout flare in his right ankle. After taking his morning meds, he started to feel dizzy. He denies any syncope or near syncope. His wife, who is a retired RN checked his BP and it was stable w/ SBP in the 140s-150s. His HR was also stable in the 80s-90s. His dizziness improved after sitting. He notes associated left-sided chest tightness. Non radiating. Non exertional. Non positional. No aggravating factors. No associated SOB, n/v or diaphoresis. He had a similar presentation when he suffered his two MIs in 1994,  before undergoing CABG. He reports daily compliance with all of his home medications. He has not had any chest discomfort since today. He states that his chest pain was relieved after EMS gave him ASA and administered oxygen. He has had no recurrence in symptoms since arriving to ER.  Past Medical History   Diagnosis  Date   .  History of peptic ulcer disease    .  Anemia    .  Hyperlipidemia    .  Aortic stenosis    .  Peripheral vascular disease    .  Atrial fibrillation    .  Carotid stenosis    .  Renal artery stenosis    .  CAD (coronary artery disease)    .  Hypertension    .  Myocardial infarction      X2   .  COPD (chronic obstructive pulmonary disease)    .  Pneumonia      HX OF   .  Sleep apnea      OXYGEN AT NIGHT 2L [place]   .  Diabetes mellitus without complication      FASTING 98-120S   .  GERD (gastroesophageal reflux disease)    .  History of blood transfusion    .  Depression    .  Anxiety     Past Surgical History   Procedure  Laterality  Date   .  Coronary artery bypass graft   1994   .  Renal artery stent       stenting of the left renal artery as well as   a cutting balloon angioplasty for treatment of in-stent restenosis coronary artery bypass grafting in 1994.   .  Knee surgery   08/2003     left knee/ARTHROSCOPIC   .  Removal of bleeding ulcer   1994   .  Endarterectomy  Right  01/05/2013     Procedure: ENDARTERECTOMY CAROTID; Surgeon: Todd F Early, MD; Location: [facility] OR; Service: Vascular; Laterality: Right;   .  Patch angioplasty  Right  01/05/2013     Procedure: PATCH ANGIOPLASTY; Surgeon: Todd F Early, MD; Location: [facility] OR; Service: Vascular; Laterality: Right;   .  Carotid endarterectomy      Family History   Problem  Relation  Age of Onset   .  Coronary artery disease  Mother    .  Heart disease  Mother    .  Coronary artery disease  Father    .  Heart disease  Father    .  Coronary artery disease       13 sibling, almost all have coronary disease and  some with premature onset   .  Heart disease       13 sibling, almost all have coronary disease and some with premature onset    Social History: reports that he quit smoking about 20 years ago. He does not have any smokeless tobacco history on file. He reports that he does not drink alcohol or use illicit drugs.  Allergies:  Allergies   Allergen  Reactions   .  Bactrim [Sulfamethoxazole-Tmp Ds]  Other (See Comments)     "does not do anything for me"     (Not in a hospital admission)  Results for orders placed during the hospital encounter of 06/26/13 (from the past 48 hour(s))   CBC Status: None    Collection Time    06/26/13 1:56 PM   Result  Value  Range    WBC  6.4  4.0 - 10.5 K/uL    RBC  4.82  4.22 - 5.81 MIL/uL    Hemoglobin  15.1  13.0 - 17.0 g/dL    HCT  42.8  39.0 - 52.0 %    MCV  88.8  78.0 - 100.0 fL    MCH  31.3  26.0 - 34.0 pg    MCHC  35.3  30.0 - 36.0 g/dL    RDW  14.2  11.5 - 15.5 %    Platelets  210  150 - 400 K/uL   BASIC METABOLIC PANEL Status: Abnormal    Collection Time    06/26/13 1:56 PM   Result  Value  Range    Sodium  140  137 - 147 mEq/L    Potassium  4.3  3.7 - 5.3 mEq/L    Chloride  100  96 - 112 mEq/L    CO2  27  19 - 32 mEq/L    Glucose, Bld  205 (*)  70 - 99 mg/dL    BUN  9  6 - 23 mg/dL    Creatinine, Ser  0.82  0.50 - 1.35 mg/dL    Calcium  9.1  8.4 - 10.5 mg/dL    GFR calc non Af Amer  82 (*)  >90 mL/min    GFR calc Af Amer  >90  >90 mL/min    Comment:  (NOTE)     The eGFR has been calculated using the CKD EPI equation.     This calculation has not been validated in all clinical situations.       eGFR's persistently <90 mL/min signify possible Chronic Kidney     Disease.   POCT I-STAT TROPONIN I Status: None    Collection Time    06/26/13 2:00 PM   Result  Value  Range    Troponin i, poc  0.00  0.00 - 0.08 ng/mL    Comment 3      Comment:  Due to the release kinetics of cTnI,     a negative result within the first hours     of the  onset of symptoms does not rule out     myocardial infarction with certainty.     If myocardial infarction is still suspected,     repeat the test at appropriate intervals.    Dg Chest 2 View  06/26/2013 CLINICAL DATA: Chest pain and dizziness. EXAM: CHEST 2 VIEW COMPARISON: 12/23/2012 FINDINGS: Sequelae of prior CABG are again identified. The cardiac silhouette remains mildly enlarged. The lungs remain hyperinflated without evidence of airspace consolidation, edema, pleural effusion, or pneumothorax. The bones appear osteopenic. No acute osseous abnormality is identified. IMPRESSION: No active cardiopulmonary disease. Electronically Signed By: Allen Grady On: 06/26/2013 15:09   Review of Systems  Constitutional: Negative for diaphoresis.  Respiratory: Negative for shortness of breath.  Cardiovascular: Positive for chest pain. Negative for palpitations, orthopnea, leg swelling and PND.  Gastrointestinal: Negative for nausea and vomiting.  Neurological: Positive for dizziness. Negative for loss of consciousness.  All other systems reviewed and are negative.   Blood pressure 148/102, pulse 94, temperature 98.7 F (37.1 C), temperature source Oral, resp. rate 17, weight 242 lb (109.77 kg), SpO2 97.00%.  Physical Exam  Constitutional: He is oriented to person, place, and time. He appears well-developed and well-nourished. No distress.  Neck: JVD (slight) present. Carotid bruit is present.  Cardiovascular: Normal rate. An irregularly irregular rhythm present. Exam reveals no gallop and no friction rub.  Murmur (2/6 SM heard throughout the precordium) heard.  Pulses:  Radial pulses are 2+ on the right side, and 2+ on the left side.  Dorsalis pedis pulses are 2+ on the right side, and 2+ on the left side.  Respiratory: Effort normal and breath sounds normal. No respiratory distress. He has no wheezes. He has no rales. He exhibits no tenderness.  GI: Soft. Bowel sounds are normal. He exhibits no  distension and no mass. There is no tenderness.  Musculoskeletal: He exhibits no edema.  Neurological: He is alert and oriented to person, place, and time.  Skin: Skin is warm and dry. He is not diaphoretic.  Psychiatric: He has a normal mood and affect. His behavior is normal.   Assessment/Plan  Active Problems:  Chest pain  Dizziness   Plan: 81 y/o male with h/o of CAD, s/p CABG x 3 in 1994, chronic rate controlled atrial fibrillation, PVD and mild AS/AR and MR, presents for evaluation of left sided chest tightness and dizziness. He is currently asymptomatic. His Afib remains rate controlled on telemetry. BP is stable. POC troponin is negative x 1. His EKG shows no significant ischemic changes. CXR demonstrates no active cardiopulmonary disease. He recently had a low risk NST in May 2014. EF at that time was 50%. BNP is slightly elevated at 160, but no significant peripheral edema.   With no further recurrence of symptoms and recent NST in May 2014, ? direct discharge from ER if second troponin returns normal. MD to assess and will determine.   Chandelle Harkey  06/26/2013, 4:06 PM   

## 2013-06-27 DIAGNOSIS — R42 Dizziness and giddiness: Secondary | ICD-10-CM

## 2013-06-27 DIAGNOSIS — Z7901 Long term (current) use of anticoagulants: Secondary | ICD-10-CM

## 2013-06-27 DIAGNOSIS — R079 Chest pain, unspecified: Secondary | ICD-10-CM

## 2013-06-27 DIAGNOSIS — I482 Chronic atrial fibrillation, unspecified: Secondary | ICD-10-CM | POA: Diagnosis present

## 2013-06-27 DIAGNOSIS — I359 Nonrheumatic aortic valve disorder, unspecified: Secondary | ICD-10-CM | POA: Diagnosis not present

## 2013-06-27 DIAGNOSIS — I2510 Atherosclerotic heart disease of native coronary artery without angina pectoris: Secondary | ICD-10-CM | POA: Diagnosis not present

## 2013-06-27 DIAGNOSIS — I4891 Unspecified atrial fibrillation: Secondary | ICD-10-CM | POA: Diagnosis not present

## 2013-06-27 DIAGNOSIS — M109 Gout, unspecified: Secondary | ICD-10-CM

## 2013-06-27 HISTORY — DX: Chest pain, unspecified: R07.9

## 2013-06-27 HISTORY — DX: Gout, unspecified: M10.9

## 2013-06-27 HISTORY — DX: Long term (current) use of anticoagulants: Z79.01

## 2013-06-27 HISTORY — DX: Dizziness and giddiness: R42

## 2013-06-27 HISTORY — DX: Chronic atrial fibrillation, unspecified: I48.20

## 2013-06-27 LAB — BASIC METABOLIC PANEL
BUN: 9 mg/dL (ref 6–23)
CO2: 28 mEq/L (ref 19–32)
Calcium: 9.2 mg/dL (ref 8.4–10.5)
Chloride: 105 mEq/L (ref 96–112)
Creatinine, Ser: 0.89 mg/dL (ref 0.50–1.35)
GFR calc Af Amer: >90 mL/min (ref >90)
GFR, EST NON AFRICAN AMERICAN: 79 mL/min — AB (ref >90)
GLUCOSE: 132 mg/dL — AB (ref 70–99)
POTASSIUM: 3.9 meq/L (ref 3.7–5.3)
SODIUM: 145 meq/L (ref 137–147)

## 2013-06-27 LAB — TROPONIN I

## 2013-06-27 LAB — CBC
HCT: 41.3 % (ref 39.0–52.0)
HEMOGLOBIN: 14.3 g/dL (ref 13.0–17.0)
MCH: 30.8 pg (ref 26.0–34.0)
MCHC: 34.6 g/dL (ref 30.0–36.0)
MCV: 89 fL (ref 78.0–100.0)
Platelets: 237 10*3/uL (ref 150–400)
RBC: 4.64 MIL/uL (ref 4.22–5.81)
RDW: 14.5 % (ref 11.5–15.5)
WBC: 8 10*3/uL (ref 4.0–10.5)

## 2013-06-27 LAB — GLUCOSE, CAPILLARY: Glucose-Capillary: 120 mg/dL — ABNORMAL HIGH (ref 70–99)

## 2013-06-27 MED ORDER — METOPROLOL TARTRATE 50 MG PO TABS: 50.0000 mg | ORAL_TABLET | Freq: Two times a day (BID) | ORAL | Status: DC

## 2013-06-27 NOTE — Progress Notes (Signed)
SUBJECTIVE:  No further complaints of lightheadedness.  He does complain of dizziness when he turns his head to the left side.  He denies any chest pain or SOB.  OBJECTIVE:   Vitals:   Filed Vitals:   06/26/13 2045 06/26/13 2118 06/27/13 0602 06/27/13 0955  BP:  175/80 124/68 127/65  Pulse: 108 95 83 94  Temp:  98.1 F (36.7 C) 98.8 F (37.1 C)   TempSrc:  Oral Oral   Resp:  16 16   Height:      Weight:      SpO2:  97% 95%    I&O's:   Intake/Output Summary (Last 24 hours) at 06/27/13 1105 Last data filed at 06/27/13 0958  Gross per 24 hour  Intake      6 ml  Output   1300 ml  Net  -1294 ml   TELEMETRY: Reviewed telemetry pt in NSR:     PHYSICAL EXAM General: Well developed, well nourished, in no acute distress Head: Eyes PERRLA, No xanthomas.   Normal cephalic and atramatic  Lungs:   Clear bilaterally to auscultation and percussion. Heart:   Irregularly irregular S1 S2 Pulses are 2+ & equal. 2/6 SM at LUSB to LLSB Abdomen: Bowel sounds are positive, abdomen soft and non-tender without masses Extremities:   No clubbing, cyanosis or edema.  DP +1 Neuro: Alert and oriented X 3. Psych:  Good affect, responds appropriately   LABS: Basic Metabolic Panel:  Recent Labs  06/26/13 1356 06/27/13 0617  NA 140 145  K 4.3 3.9  CL 100 105  CO2 27 28  GLUCOSE 205* 132*  BUN 9 9  CREATININE 0.82 0.89  CALCIUM 9.1 9.2   Liver Function Tests: No results found for this basename: AST, ALT, ALKPHOS, BILITOT, PROT, ALBUMIN,  in the last 72 hours No results found for this basename: LIPASE, AMYLASE,  in the last 72 hours CBC:  Recent Labs  06/26/13 1356 06/27/13 0617  WBC 6.4 8.0  HGB 15.1 14.3  HCT 42.8 41.3  MCV 88.8 89.0  PLT 210 237   Cardiac Enzymes:  Recent Labs  06/26/13 1919 06/27/13 0020 06/27/13 0617  TROPONINI <0.30 <0.30 <0.30   BNP: No components found with this basename: POCBNP,  D-Dimer: No results found for this basename: DDIMER,  in the  last 72 hours Hemoglobin A1C: No results found for this basename: HGBA1C,  in the last 72 hours Fasting Lipid Panel: No results found for this basename: CHOL, HDL, LDLCALC, TRIG, CHOLHDL, LDLDIRECT,  in the last 72 hours Thyroid Function Tests: No results found for this basename: TSH, T4TOTAL, FREET3, T3FREE, THYROIDAB,  in the last 72 hours Anemia Panel: No results found for this basename: VITAMINB12, FOLATE, FERRITIN, TIBC, IRON, RETICCTPCT,  in the last 72 hours Coag Panel:   Lab Results  Component Value Date   INR 1.20 12/23/2012   INR 1.1 09/28/2009   INR 2.1 09/01/2009   PROTIME 20.6 11/04/2008    RADIOLOGY: Dg Chest 2 View  06/26/2013   CLINICAL DATA:  Chest pain and dizziness.  EXAM: CHEST  2 VIEW  COMPARISON:  12/23/2012  FINDINGS: Sequelae of prior CABG are again identified. The cardiac silhouette remains mildly enlarged. The lungs remain hyperinflated without evidence of airspace consolidation, edema, pleural effusion, or pneumothorax. The bones appear osteopenic. No acute osseous abnormality is identified.  IMPRESSION: No active cardiopulmonary disease.   Electronically Signed   By: Logan Bores   On: 06/26/2013 15:09   Assessment/Plan  Active  Problems:  Chest pain  Dizziness    80 y/o male with h/o of CAD, s/p CABG x 3 in 1994, chronic rate controlled atrial fibrillation, PVD and mild AS/AR and MR, presents for evaluation of left sided chest tightness and dizziness. He is currently asymptomatic. His Afib remains rate controlled on telemetry. BP is stable. POC troponin is negative x 3. His EKG shows no significant ischemic changes. CXR demonstrates no active cardiopulmonary disease. He recently had a low risk NST in May 2014. EF at that time was 50%. BNP is slightly elevated at 160, but no significant peripheral edema. During admission exam the patient's HR increased in the 130s and his Lopressor was increased to BID. Of note he started steroids the am of admission for gout flare.   His HR is now under good control as well as BP.  His dizziness is most consistent with benign positional vertigo and occurs only when turning his head to the left.  - Patient is asymptomatic from a cardiac standpoint this am.  Will d/c home - continue steroid taper for gout flare - continue higher dose of beta blocker at 50mg  BID - Followup with PCP for Vertigo on Monday - Followup with Dr. Stanford Breed in 1-2 weeks in office   Sueanne Margarita, MD  06/27/2013  11:05 AM

## 2013-06-27 NOTE — Discharge Instructions (Signed)
Chest Pain (Nonspecific) °It is often hard to give a specific diagnosis for the cause of chest pain. There is always a chance that your pain could be related to something serious, such as a heart attack or a blood clot in the lungs. You need to follow up with your caregiver for further evaluation. °CAUSES  °· Heartburn. °· Pneumonia or bronchitis. °· Anxiety or stress. °· Inflammation around your heart (pericarditis) or lung (pleuritis or pleurisy). °· A blood clot in the lung. °· A collapsed lung (pneumothorax). It can develop suddenly on its own (spontaneous pneumothorax) or from injury (trauma) to the chest. °· Shingles infection (herpes zoster virus). °The chest wall is composed of bones, muscles, and cartilage. Any of these can be the source of the pain. °· The bones can be bruised by injury. °· The muscles or cartilage can be strained by coughing or overwork. °· The cartilage can be affected by inflammation and become sore (costochondritis). °DIAGNOSIS  °Lab tests or other studies, such as X-rays, electrocardiography, stress testing, or cardiac imaging, may be needed to find the cause of your pain.  °TREATMENT  °· Treatment depends on what may be causing your chest pain. Treatment may include: °· Acid blockers for heartburn. °· Anti-inflammatory medicine. °· Pain medicine for inflammatory conditions. °· Antibiotics if an infection is present. °· You may be advised to change lifestyle habits. This includes stopping smoking and avoiding alcohol, caffeine, and chocolate. °· You may be advised to keep your head raised (elevated) when sleeping. This reduces the chance of acid going backward from your stomach into your esophagus. °· Most of the time, nonspecific chest pain will improve within 2 to 3 days with rest and mild pain medicine. °HOME CARE INSTRUCTIONS  °· If antibiotics were prescribed, take your antibiotics as directed. Finish them even if you start to feel better. °· For the next few days, avoid physical  activities that bring on chest pain. Continue physical activities as directed. °· Do not smoke. °· Avoid drinking alcohol. °· Only take over-the-counter or prescription medicine for pain, discomfort, or fever as directed by your caregiver. °· Follow your caregiver's suggestions for further testing if your chest pain does not go away. °· Keep any follow-up appointments you made. If you do not go to an appointment, you could develop lasting (chronic) problems with pain. If there is any problem keeping an appointment, you must call to reschedule. °SEEK MEDICAL CARE IF:  °· You think you are having problems from the medicine you are taking. Read your medicine instructions carefully. °· Your chest pain does not go away, even after treatment. °· You develop a rash with blisters on your chest. °SEEK IMMEDIATE MEDICAL CARE IF:  °· You have increased chest pain or pain that spreads to your arm, neck, jaw, back, or abdomen. °· You develop shortness of breath, an increasing cough, or you are coughing up blood. °· You have severe back or abdominal pain, feel nauseous, or vomit. °· You develop severe weakness, fainting, or chills. °· You have a fever. °THIS IS AN EMERGENCY. Do not wait to see if the pain will go away. Get medical help at once. Call your local emergency services (911 in [place] Do not drive yourself to the hospital. °MAKE SURE YOU:  °· Understand these instructions. °· Will watch your condition. °· Will get help right away if you are not doing well or get worse. °Document Released: 02/28/2005 Document Revised: 08/13/2011 Document Reviewed: 12/25/2007 °ExitCare® Patient Information ©2014 [employer]   [employer]. ° °

## 2013-06-27 NOTE — Discharge Summary (Signed)
Patient ID: [name],  MRN: [id number], DOB/AGE: [birth year] 62 y.o.  Admit date: 06/26/2013 Discharge date: 06/27/2013  Primary Care Provider: Dr D. [facility] Primary Cardiologist: Dr Stanford Breed  Discharge Diagnoses Principal Problem:   Chest pain with moderate risk of acute coronary syndrome Active Problems:   CAD - CABG '94, low risk Myoview May 2014   Tachycardia- beta blocker increased   Vertigo   Chronic atrial fibrillation   Gout attack- on steroid dose pack   HYPERLIPIDEMIA   Essential hypertension, benign   AORTIC STENOSIS- mild   CAROTID STENOSIS- moderate   RENAL ARTERY STENOSIS- moderate   Chronic anticoagulation       Hospital Course:  80 y/o male with h/o of CAD, s/p CABG x 3 in 1994, chronic rate controlled atrial fibrillation, PVD and mild AS/AR and MR, presents for evaluation of left sided chest tightness and dizziness. He is currently asymptomatic. His Afib remains rate controlled on telemetry. BP is stable. POC troponin is negative x 3. His EKG shows no significant ischemic changes. CXR demonstrates no active cardiopulmonary disease. He recently had a low risk NST in May 2014. EF at that time was 50%. BNP is slightly elevated at 160, but no significant peripheral edema. During admission exam the patient's HR increased in the 130s and his Lopressor was increased to BID. Of note he started steroids the am of admission for gout flare. His HR is now under good control as well as BP. His dizziness is most consistent with benign positional vertigo and occurs only when turning his head to the left. Dr turner saw the patient the morning of the 24th and feelsl he can be discharged. We will arrange a follow up with Dr Stanford Breed. He has been instrutced to follow up with Dr Delena Bali in [place]. It should be noted his beta blocker was increased this admission for tachycardia.    Discharge Vitals:  Blood pressure 127/65, pulse 94, temperature 98.8 F (37.1 C), temperature source  Oral, resp. rate 16, height 6' (1.829 m), weight 249 lb 6.4 oz (113.127 kg), SpO2 95.00%.    Labs: Results for orders placed during the hospital encounter of 06/26/13 (from the past 48 hour(s))  CBC     Status: None   Collection Time    06/26/13  1:56 PM      Result Value Range   WBC 6.4  4.0 - 10.5 K/uL   RBC 4.82  4.22 - 5.81 MIL/uL   Hemoglobin 15.1  13.0 - 17.0 g/dL   HCT 42.8  39.0 - 52.0 %   MCV 88.8  78.0 - 100.0 fL   MCH 31.3  26.0 - 34.0 pg   MCHC 35.3  30.0 - 36.0 g/dL   RDW 14.2  11.5 - 15.5 %   Platelets 210  150 - 400 K/uL  BASIC METABOLIC PANEL     Status: Abnormal   Collection Time    06/26/13  1:56 PM      Result Value Range   Sodium 140  137 - 147 mEq/L   Potassium 4.3  3.7 - 5.3 mEq/L   Chloride 100  96 - 112 mEq/L   CO2 27  19 - 32 mEq/L   Glucose, Bld 205 (*) 70 - 99 mg/dL   BUN 9  6 - 23 mg/dL   Creatinine, Ser 0.82  0.50 - 1.35 mg/dL   Calcium 9.1  8.4 - 10.5 mg/dL   GFR calc non Af Amer 82 (*) >90 mL/min  GFR calc Af Amer >90  >90 mL/min   Comment: (NOTE)     The eGFR has been calculated using the CKD EPI equation.     This calculation has not been validated in all clinical situations.     eGFR's persistently <90 mL/min signify possible Chronic Kidney     Disease.  POCT I-STAT TROPONIN I     Status: None   Collection Time    06/26/13  2:00 PM      Result Value Range   Troponin i, poc 0.00  0.00 - 0.08 ng/mL   Comment 3            Comment: Due to the release kinetics of cTnI,     a negative result within the first hours     of the onset of symptoms does not rule out     myocardial infarction with certainty.     If myocardial infarction is still suspected,     repeat the test at appropriate intervals.  TROPONIN I     Status: None   Collection Time    06/26/13  7:19 PM      Result Value Range   Troponin I <0.30  <0.30 ng/mL   Comment:            Due to the release kinetics of cTnI,     a negative result within the first hours     of the  onset of symptoms does not rule out     myocardial infarction with certainty.     If myocardial infarction is still suspected,     repeat the test at appropriate intervals.  GLUCOSE, CAPILLARY     Status: Abnormal   Collection Time    06/26/13  9:17 PM      Result Value Range   Glucose-Capillary 260 (*) 70 - 99 mg/dL   Comment 1 Notify RN    TROPONIN I     Status: None   Collection Time    06/27/13 12:20 AM      Result Value Range   Troponin I <0.30  <0.30 ng/mL   Comment:            Due to the release kinetics of cTnI,     a negative result within the first hours     of the onset of symptoms does not rule out     myocardial infarction with certainty.     If myocardial infarction is still suspected,     repeat the test at appropriate intervals.  BASIC METABOLIC PANEL     Status: Abnormal   Collection Time    06/27/13  6:17 AM      Result Value Range   Sodium 145  137 - 147 mEq/L   Potassium 3.9  3.7 - 5.3 mEq/L   Chloride 105  96 - 112 mEq/L   CO2 28  19 - 32 mEq/L   Glucose, Bld 132 (*) 70 - 99 mg/dL   BUN 9  6 - 23 mg/dL   Creatinine, Ser 0.89  0.50 - 1.35 mg/dL   Calcium 9.2  8.4 - 10.5 mg/dL   GFR calc non Af Amer 79 (*) >90 mL/min   GFR calc Af Amer >90  >90 mL/min   Comment: (NOTE)     The eGFR has been calculated using the CKD EPI equation.     This calculation has not been validated in all clinical situations.     eGFR's persistently <90 mL/min  signify possible Chronic Kidney     Disease.  CBC     Status: None   Collection Time    06/27/13  6:17 AM      Result Value Range   WBC 8.0  4.0 - 10.5 K/uL   RBC 4.64  4.22 - 5.81 MIL/uL   Hemoglobin 14.3  13.0 - 17.0 g/dL   HCT 41.3  39.0 - 52.0 %   MCV 89.0  78.0 - 100.0 fL   MCH 30.8  26.0 - 34.0 pg   MCHC 34.6  30.0 - 36.0 g/dL   RDW 14.5  11.5 - 15.5 %   Platelets 237  150 - 400 K/uL  TROPONIN I     Status: None   Collection Time    06/27/13  6:17 AM      Result Value Range   Troponin I <0.30  <0.30 ng/mL    Comment:            Due to the release kinetics of cTnI,     a negative result within the first hours     of the onset of symptoms does not rule out     myocardial infarction with certainty.     If myocardial infarction is still suspected,     repeat the test at appropriate intervals.  GLUCOSE, CAPILLARY     Status: Abnormal   Collection Time    06/27/13  7:30 AM      Result Value Range   Glucose-Capillary 120 (*) 70 - 99 mg/dL   Comment 1 Notify RN     Comment 2 Documented in Chart      Disposition:      Follow-up Information   Follow up with Kirk Ruths, MD. (office will call you)    Specialty:  Cardiology   Contact information:   [address] [place] [address] [phone]       Follow up with [facility], MD. (call office to be seen early next week)    Specialty:  Internal Medicine   Contact information:   [place] [address] [phone]       Discharge Medications:    Medication List         acetaminophen 500 MG tablet  Commonly known as:  TYLENOL  Take 500 mg by mouth every 6 (six) hours as needed for pain.     acetaminophen-codeine 300-30 MG per tablet  Commonly known as:  TYLENOL #3  Take 1 tablet by mouth at bedtime as needed for pain.     acyclovir 800 MG tablet  Commonly known as:  ZOVIRAX  Take 400 mg by mouth 2 (two) times daily.     albuterol 90 MCG/ACT inhaler  Commonly known as:  PROVENTIL,VENTOLIN  Inhale 2 puffs into the lungs every 4 (four) hours as needed for shortness of breath.     allopurinol 300 MG tablet  Commonly known as:  ZYLOPRIM  Take 300 mg by mouth daily.     azelastine 137 MCG/SPRAY nasal spray  Commonly known as:  ASTELIN  Place 2 sprays into both nostrils daily.     benazepril 20 MG tablet  Commonly known as:  LOTENSIN  Take 10 mg by mouth daily.     budesonide-formoterol 80-4.5 MCG/ACT inhaler  Commonly known as:  SYMBICORT  Inhale 2 puffs into the lungs 2  (two) times daily as needed (bronchitis).     buPROPion 150 MG 12 hr tablet  Commonly known as:  WELLBUTRIN SR  Take 150 mg by mouth daily as needed (anxiety).     DUONEB 0.5-2.5 (3) MG/3ML Soln  Generic drug:  ipratropium-albuterol  Take 3 mLs by nebulization 3 (three) times daily as needed (for bronchitis or other virus that affects breathing).     ferrous sulfate 325 (65 FE) MG tablet  Take 325 mg by mouth daily with breakfast.     furosemide 20 MG tablet  Commonly known as:  LASIX  Take 20 mg by mouth daily.     lansoprazole 30 MG capsule  Commonly known as:  PREVACID  Take 30 mg by mouth daily.     methocarbamol 750 MG tablet  Commonly known as:  ROBAXIN  Take 750 mg by mouth 2 (two) times daily.     metoprolol 50 MG tablet  Commonly known as:  LOPRESSOR  Take 1 tablet (50 mg total) by mouth 2 (two) times daily.     multivitamin with minerals Tabs tablet  Take 1 tablet by mouth daily.     multivitamin-lutein Caps capsule  Take 1 capsule by mouth daily.     PRADAXA 150 MG Caps capsule  Generic drug:  dabigatran  Take 150 mg by mouth 2 (two) times daily.     predniSONE 20 MG tablet  Commonly known as:  DELTASONE  Take 40-60 mg by mouth See admin instructions. Tapered dose as needed for gout attacks - Take 37m (3 tablets) on day 1, followed by 418m(2 tablets) daily x 4 days.     simvastatin 40 MG tablet  Commonly known as:  ZOCOR  Take 1 tablet (40 mg total) by mouth at bedtime.     terazosin 5 MG capsule  Commonly known as:  HYTRIN  Take 5 mg by mouth at bedtime.     trolamine salicylate 10 % cream  Commonly known as:  ASPERCREME  Apply 1 application topically 4 (four) times daily as needed (pain).     Vitamin D 2000 UNITS Caps  Take 2,000 Units by mouth daily.         Duration of Discharge Encounter: Greater than 30 minutes including physician time.  SiCleto, ClaggettA-C 06/27/2013 11:44 AM

## 2013-06-29 DIAGNOSIS — Z6833 Body mass index (BMI) 33.0-33.9, adult: Secondary | ICD-10-CM | POA: Diagnosis not present

## 2013-06-29 DIAGNOSIS — H6690 Otitis media, unspecified, unspecified ear: Secondary | ICD-10-CM | POA: Diagnosis not present

## 2013-06-29 DIAGNOSIS — R42 Dizziness and giddiness: Secondary | ICD-10-CM | POA: Diagnosis not present

## 2013-07-01 ENCOUNTER — Telehealth: Payer: Self-pay | Admitting: Cardiology

## 2013-07-01 NOTE — Telephone Encounter (Signed)
New problem:  [name] wife called to set up a post hospital visit. Dr. Jacalyn Lefevre next available is  At 8:15 on 07/30/13. Wife states that wouldn't do... She did not want her husband to see a PA or NP... She is requesting a call back from [place]. She wants to know if her husband can be worked in asap.

## 2013-07-01 NOTE — Telephone Encounter (Signed)
Left message on private voice mail. appt 07-06-13 @ 2pm.

## 2013-07-02 ENCOUNTER — Other Ambulatory Visit: Payer: Self-pay | Admitting: Vascular Surgery

## 2013-07-02 DIAGNOSIS — I6529 Occlusion and stenosis of unspecified carotid artery: Secondary | ICD-10-CM

## 2013-07-02 DIAGNOSIS — Z48812 Encounter for surgical aftercare following surgery on the circulatory system: Secondary | ICD-10-CM

## 2013-07-05 HISTORY — PX: BOWEL RESECTION: SHX1257

## 2013-07-06 ENCOUNTER — Encounter: Payer: Self-pay | Admitting: Cardiology

## 2013-07-06 ENCOUNTER — Ambulatory Visit (INDEPENDENT_AMBULATORY_CARE_PROVIDER_SITE_OTHER): Payer: Medicare Other | Admitting: Cardiology

## 2013-07-06 ENCOUNTER — Encounter: Payer: Self-pay | Admitting: *Deleted

## 2013-07-06 VITALS — BP 138/60 | HR 73 | Ht 72.0 in | Wt 249.0 lb

## 2013-07-06 DIAGNOSIS — I6529 Occlusion and stenosis of unspecified carotid artery: Secondary | ICD-10-CM

## 2013-07-06 DIAGNOSIS — I359 Nonrheumatic aortic valve disorder, unspecified: Secondary | ICD-10-CM

## 2013-07-06 DIAGNOSIS — I10 Essential (primary) hypertension: Secondary | ICD-10-CM

## 2013-07-06 DIAGNOSIS — I2510 Atherosclerotic heart disease of native coronary artery without angina pectoris: Secondary | ICD-10-CM

## 2013-07-06 DIAGNOSIS — I701 Atherosclerosis of renal artery: Secondary | ICD-10-CM

## 2013-07-06 DIAGNOSIS — I482 Chronic atrial fibrillation, unspecified: Secondary | ICD-10-CM

## 2013-07-06 DIAGNOSIS — E785 Hyperlipidemia, unspecified: Secondary | ICD-10-CM

## 2013-07-06 DIAGNOSIS — I4891 Unspecified atrial fibrillation: Secondary | ICD-10-CM | POA: Diagnosis not present

## 2013-07-06 MED ORDER — METOPROLOL TARTRATE 50 MG PO TABS: 50.0000 mg | ORAL_TABLET | Freq: Two times a day (BID) | ORAL | Status: DC

## 2013-07-06 NOTE — Assessment & Plan Note (Signed)
Continue statin. Now followed by vascular surgery. Doing well post carotid endarterectomy.

## 2013-07-06 NOTE — Assessment & Plan Note (Signed)
Plan FU echocardiogram.

## 2013-07-06 NOTE — Assessment & Plan Note (Signed)
Continue statin. 

## 2013-07-06 NOTE — Assessment & Plan Note (Signed)
Blood pressure controlled. Continue present medications. 

## 2013-07-06 NOTE — Patient Instructions (Signed)

## 2013-07-06 NOTE — Assessment & Plan Note (Signed)
Continue statin. Not on aspirin given need for anticoagulation. 

## 2013-07-06 NOTE — Progress Notes (Signed)
HPI: FU coronary artery disease, status post coronary artery bypassing graft, permanent atrial fibrillation and peripheral vascular disease (renal artery stenosis and cerebrovascular disease). His last nuclear study in June of 2014 showed an ejection fraction of 50% with no scar or ischemia. Echocardiogram in June 2013 showed normal LV function, mild left ventricular hypertrophy, mild aortic stenosis/aortic insufficiency, mild mitral regurgitation and moderate left atrial enlargement. Renal Dopplers were performed in June 2013 and revealed 1-59% bilateral stenosis. Last carotid Dopplers in June of 2014 showed 80-99% right and 69% left stenosis. Abnormal thyroid. Dedicated thyroid ultrasound showed multinodular border and a dominant nodule in the right lower pole and aspiration recommended. Cytology negative. Patient referred to vascular surgery. Patient then had right carotid endarterectomy in August of 2014. Patient recently admitted with chest pain and ruled out. Since then the patient denies any dyspnea on exertion, orthopnea, PND, pedal edema, palpitations, syncope or chest pain.    Current Outpatient Prescriptions  Medication Sig Dispense Refill  . acetaminophen (TYLENOL) 500 MG tablet Take 500 mg by mouth every 6 (six) hours as needed for pain.       [name] acetaminophen-codeine (TYLENOL #3) 300-30 MG per tablet Take 1 tablet by mouth at bedtime as needed for pain.      [name] acyclovir (ZOVIRAX) 800 MG tablet Take 400 mg by mouth 2 (two) times daily.       [name] albuterol (PROVENTIL,VENTOLIN) 90 MCG/ACT inhaler Inhale 2 puffs into the lungs every 4 (four) hours as needed for shortness of breath.       . allopurinol (ZYLOPRIM) 300 MG tablet Take 300 mg by mouth daily.        [name] azelastine (ASTELIN) 137 MCG/SPRAY nasal spray Place 2 sprays into both nostrils daily.       . benazepril (LOTENSIN) 20 MG tablet Take 10 mg by mouth daily.       . budesonide-formoterol (SYMBICORT) 80-4.5 MCG/ACT inhaler  Inhale 2 puffs into the lungs 2 (two) times daily as needed (bronchitis).      [name] buPROPion (WELLBUTRIN SR) 150 MG 12 hr tablet Take 150 mg by mouth daily as needed (anxiety).       . Cholecalciferol (VITAMIN D) 2000 UNITS CAPS Take 2,000 Units by mouth daily.      . dabigatran (PRADAXA) 150 MG CAPS Take 150 mg by mouth 2 (two) times daily.       . ferrous sulfate 325 (65 FE) MG tablet Take 325 mg by mouth daily with breakfast.       . furosemide (LASIX) 20 MG tablet Take 20 mg by mouth daily.        [name] ipratropium-albuterol (DUONEB) 0.5-2.5 (3) MG/3ML SOLN Take 3 mLs by nebulization 3 (three) times daily as needed (for bronchitis or other virus that affects breathing).       . lansoprazole (PREVACID) 30 MG capsule Take 30 mg by mouth daily.        . methocarbamol (ROBAXIN) 750 MG tablet Take 750 mg by mouth 2 (two) times daily.       . metoprolol (LOPRESSOR) 50 MG tablet Take 1 tablet (50 mg total) by mouth 2 (two) times daily.  60 tablet  11  . Multiple Vitamin (MULTIVITAMIN WITH MINERALS) TABS Take 1 tablet by mouth daily.      . multivitamin-lutein (OCUVITE-LUTEIN) CAPS Take 1 capsule by mouth daily.      . predniSONE (DELTASONE) 20 MG tablet Take 40-60 mg by mouth See admin instructions.  Tapered dose as needed for gout attacks - Take 60mg  (3 tablets) on day 1, followed by 40mg  (2 tablets) daily x 4 days.      . simvastatin (ZOCOR) 40 MG tablet Take 1 tablet (40 mg total) by mouth at bedtime.  90 tablet  3  . terazosin (HYTRIN) 5 MG capsule Take 5 mg by mouth at bedtime.       . trolamine salicylate (ASPERCREME) 10 % cream Apply 1 application topically 4 (four) times daily as needed (pain).        No current facility-administered medications for this visit.     Past Medical History  Diagnosis Date  . History of peptic ulcer disease   . Anemia   . Hyperlipidemia   . Aortic stenosis    . Peripheral vascular disease   . Atrial fibrillation   . Carotid stenosis   . Renal artery stenosis     . CAD (coronary artery disease)   . Hypertension   . Myocardial infarction     X2  . COPD (chronic obstructive pulmonary disease)   . Pneumonia     HX OF  . Sleep apnea     OXYGEN AT NIGHT 2L [place]  . Diabetes mellitus without complication     FASTING 98-120S  . GERD (gastroesophageal reflux disease)   . History of blood transfusion   . Depression   . Anxiety     Past Surgical History  Procedure Laterality Date  . Coronary artery bypass graft  1994  . Renal artery stent      stenting of the left renal artery as well as a cutting balloon angioplasty for treatment of in-stent restenosis coronary artery bypass grafting in 1994.  [name] Knee surgery  08/2003    left knee/ARTHROSCOPIC  . Removal of bleeding ulcer  1994  . Endarterectomy Right 01/05/2013    Procedure: ENDARTERECTOMY CAROTID;  Surgeon: Rosetta Posner, MD;  Location: [facility];  Service: Vascular;  Laterality: Right;  . Patch angioplasty Right 01/05/2013    Procedure: PATCH ANGIOPLASTY;  Surgeon: Rosetta Posner, MD;  Location: [facility] OR;  Service: Vascular;  Laterality: Right;  . Carotid endarterectomy      History   Social History  . Marital Status: Married    Spouse Name: N/A    Number of Children: N/A  . Years of Education: N/A   Occupational History  . Not on file.   Social History Main Topics  . Smoking status: Former Smoker    Quit date: 04/27/1993  . Smokeless tobacco: Not on file  . Alcohol Use: No  . Drug Use: No  . Sexual Activity: Yes   Other Topics Concern  . Not on file   Social History Narrative   Social History:   Married    Tobacco Use - No.    Alcohol Use - yes         Family History:   Mother died at age 1 of coronary disease.  Father died at 75 of coronary disease.  He has 13 siblings, almost all of whom have coronary disease and some with premature onset.    ROS: no fevers or chills, productive cough, hemoptysis, dysphasia, odynophagia, melena, hematochezia, dysuria, hematuria, rash, seizure  activity, orthopnea, PND, pedal edema, claudication. Remaining systems are negative.  Physical Exam: Well-developed well-nourished in no acute distress.  Skin is warm and dry.  HEENT is normal.  Neck is supple.  Chest is clear to auscultation with normal expansion.  Cardiovascular  exam is irregular, 3/6 systolic murmur left sternal border. Abdominal exam nontender or distended. No masses palpated. Extremities show trace edema. neuro grossly intact

## 2013-07-06 NOTE — Assessment & Plan Note (Signed)
Continue beta blocker and pradaxa.

## 2013-07-09 DIAGNOSIS — I2510 Atherosclerotic heart disease of native coronary artery without angina pectoris: Secondary | ICD-10-CM | POA: Diagnosis not present

## 2013-07-09 DIAGNOSIS — E785 Hyperlipidemia, unspecified: Secondary | ICD-10-CM | POA: Diagnosis not present

## 2013-07-09 DIAGNOSIS — D509 Iron deficiency anemia, unspecified: Secondary | ICD-10-CM | POA: Diagnosis not present

## 2013-07-09 DIAGNOSIS — I4891 Unspecified atrial fibrillation: Secondary | ICD-10-CM | POA: Diagnosis not present

## 2013-07-09 DIAGNOSIS — M109 Gout, unspecified: Secondary | ICD-10-CM | POA: Diagnosis not present

## 2013-07-09 DIAGNOSIS — Z6827 Body mass index (BMI) 27.0-27.9, adult: Secondary | ICD-10-CM | POA: Diagnosis not present

## 2013-07-09 DIAGNOSIS — E119 Type 2 diabetes mellitus without complications: Secondary | ICD-10-CM | POA: Diagnosis not present

## 2013-07-18 DIAGNOSIS — M109 Gout, unspecified: Secondary | ICD-10-CM | POA: Diagnosis present

## 2013-07-18 DIAGNOSIS — R9431 Abnormal electrocardiogram [ECG] [EKG]: Secondary | ICD-10-CM | POA: Diagnosis not present

## 2013-07-18 DIAGNOSIS — Z7901 Long term (current) use of anticoagulants: Secondary | ICD-10-CM | POA: Diagnosis not present

## 2013-07-18 DIAGNOSIS — R1084 Generalized abdominal pain: Secondary | ICD-10-CM | POA: Diagnosis not present

## 2013-07-18 DIAGNOSIS — F3289 Other specified depressive episodes: Secondary | ICD-10-CM | POA: Diagnosis present

## 2013-07-18 DIAGNOSIS — E876 Hypokalemia: Secondary | ICD-10-CM | POA: Diagnosis not present

## 2013-07-18 DIAGNOSIS — R0602 Shortness of breath: Secondary | ICD-10-CM | POA: Diagnosis not present

## 2013-07-18 DIAGNOSIS — K631 Perforation of intestine (nontraumatic): Secondary | ICD-10-CM | POA: Diagnosis not present

## 2013-07-18 DIAGNOSIS — E1159 Type 2 diabetes mellitus with other circulatory complications: Secondary | ICD-10-CM | POA: Diagnosis not present

## 2013-07-18 DIAGNOSIS — K219 Gastro-esophageal reflux disease without esophagitis: Secondary | ICD-10-CM | POA: Diagnosis present

## 2013-07-18 DIAGNOSIS — J9819 Other pulmonary collapse: Secondary | ICD-10-CM | POA: Diagnosis not present

## 2013-07-18 DIAGNOSIS — R339 Retention of urine, unspecified: Secondary | ICD-10-CM | POA: Diagnosis present

## 2013-07-18 DIAGNOSIS — Z79899 Other long term (current) drug therapy: Secondary | ICD-10-CM | POA: Diagnosis not present

## 2013-07-18 DIAGNOSIS — I509 Heart failure, unspecified: Secondary | ICD-10-CM | POA: Diagnosis not present

## 2013-07-18 DIAGNOSIS — K929 Disease of digestive system, unspecified: Secondary | ICD-10-CM | POA: Diagnosis not present

## 2013-07-18 DIAGNOSIS — K560 Paralytic ileus: Secondary | ICD-10-CM | POA: Diagnosis not present

## 2013-07-18 DIAGNOSIS — I2510 Atherosclerotic heart disease of native coronary artery without angina pectoris: Secondary | ICD-10-CM | POA: Diagnosis present

## 2013-07-18 DIAGNOSIS — I4891 Unspecified atrial fibrillation: Secondary | ICD-10-CM | POA: Diagnosis not present

## 2013-07-18 DIAGNOSIS — K2970 Gastritis, unspecified, without bleeding: Secondary | ICD-10-CM | POA: Diagnosis not present

## 2013-07-18 DIAGNOSIS — G8918 Other acute postprocedural pain: Secondary | ICD-10-CM | POA: Diagnosis not present

## 2013-07-18 DIAGNOSIS — E669 Obesity, unspecified: Secondary | ICD-10-CM | POA: Diagnosis present

## 2013-07-18 DIAGNOSIS — R109 Unspecified abdominal pain: Secondary | ICD-10-CM | POA: Diagnosis not present

## 2013-07-18 DIAGNOSIS — R110 Nausea: Secondary | ICD-10-CM | POA: Diagnosis not present

## 2013-07-18 DIAGNOSIS — I10 Essential (primary) hypertension: Secondary | ICD-10-CM | POA: Diagnosis not present

## 2013-07-18 DIAGNOSIS — E119 Type 2 diabetes mellitus without complications: Secondary | ICD-10-CM | POA: Diagnosis not present

## 2013-07-18 DIAGNOSIS — Z951 Presence of aortocoronary bypass graft: Secondary | ICD-10-CM | POA: Diagnosis not present

## 2013-07-18 DIAGNOSIS — K633 Ulcer of intestine: Secondary | ICD-10-CM | POA: Diagnosis not present

## 2013-07-18 DIAGNOSIS — I252 Old myocardial infarction: Secondary | ICD-10-CM | POA: Diagnosis not present

## 2013-07-18 DIAGNOSIS — N509 Disorder of male genital organs, unspecified: Secondary | ICD-10-CM | POA: Diagnosis not present

## 2013-07-18 DIAGNOSIS — F329 Major depressive disorder, single episode, unspecified: Secondary | ICD-10-CM | POA: Diagnosis present

## 2013-07-18 DIAGNOSIS — J449 Chronic obstructive pulmonary disease, unspecified: Secondary | ICD-10-CM | POA: Diagnosis present

## 2013-07-18 DIAGNOSIS — E7800 Pure hypercholesterolemia, unspecified: Secondary | ICD-10-CM | POA: Diagnosis present

## 2013-07-28 ENCOUNTER — Other Ambulatory Visit (HOSPITAL_COMMUNITY): Payer: Medicare Other

## 2013-08-02 DIAGNOSIS — I214 Non-ST elevation (NSTEMI) myocardial infarction: Secondary | ICD-10-CM

## 2013-08-02 HISTORY — DX: Non-ST elevation (NSTEMI) myocardial infarction: I21.4

## 2013-08-03 DIAGNOSIS — Z6832 Body mass index (BMI) 32.0-32.9, adult: Secondary | ICD-10-CM | POA: Diagnosis not present

## 2013-08-03 DIAGNOSIS — K929 Disease of digestive system, unspecified: Secondary | ICD-10-CM | POA: Diagnosis not present

## 2013-08-03 DIAGNOSIS — R339 Retention of urine, unspecified: Secondary | ICD-10-CM | POA: Diagnosis not present

## 2013-08-03 DIAGNOSIS — K631 Perforation of intestine (nontraumatic): Secondary | ICD-10-CM | POA: Diagnosis not present

## 2013-08-04 ENCOUNTER — Other Ambulatory Visit (HOSPITAL_COMMUNITY): Payer: Medicare Other

## 2013-08-04 ENCOUNTER — Ambulatory Visit: Payer: Medicare Other | Admitting: Vascular Surgery

## 2013-08-09 ENCOUNTER — Other Ambulatory Visit: Payer: Self-pay | Admitting: Cardiology

## 2013-08-13 DIAGNOSIS — G4700 Insomnia, unspecified: Secondary | ICD-10-CM | POA: Diagnosis not present

## 2013-08-13 DIAGNOSIS — Z6832 Body mass index (BMI) 32.0-32.9, adult: Secondary | ICD-10-CM | POA: Diagnosis not present

## 2013-08-13 DIAGNOSIS — IMO0002 Reserved for concepts with insufficient information to code with codable children: Secondary | ICD-10-CM | POA: Diagnosis not present

## 2013-08-19 ENCOUNTER — Ambulatory Visit (HOSPITAL_COMMUNITY): Payer: Medicare Other | Attending: Cardiology | Admitting: Radiology

## 2013-08-19 DIAGNOSIS — I059 Rheumatic mitral valve disease, unspecified: Secondary | ICD-10-CM | POA: Insufficient documentation

## 2013-08-19 DIAGNOSIS — Z951 Presence of aortocoronary bypass graft: Secondary | ICD-10-CM | POA: Diagnosis not present

## 2013-08-19 DIAGNOSIS — I4891 Unspecified atrial fibrillation: Secondary | ICD-10-CM | POA: Insufficient documentation

## 2013-08-19 DIAGNOSIS — E785 Hyperlipidemia, unspecified: Secondary | ICD-10-CM | POA: Diagnosis not present

## 2013-08-19 DIAGNOSIS — I359 Nonrheumatic aortic valve disorder, unspecified: Secondary | ICD-10-CM | POA: Insufficient documentation

## 2013-08-19 DIAGNOSIS — I2510 Atherosclerotic heart disease of native coronary artery without angina pectoris: Secondary | ICD-10-CM | POA: Diagnosis not present

## 2013-08-19 DIAGNOSIS — G4730 Sleep apnea, unspecified: Secondary | ICD-10-CM | POA: Diagnosis not present

## 2013-08-19 DIAGNOSIS — R079 Chest pain, unspecified: Secondary | ICD-10-CM | POA: Insufficient documentation

## 2013-08-19 DIAGNOSIS — I6529 Occlusion and stenosis of unspecified carotid artery: Secondary | ICD-10-CM | POA: Insufficient documentation

## 2013-08-19 DIAGNOSIS — I739 Peripheral vascular disease, unspecified: Secondary | ICD-10-CM | POA: Diagnosis not present

## 2013-08-19 DIAGNOSIS — I252 Old myocardial infarction: Secondary | ICD-10-CM | POA: Diagnosis not present

## 2013-08-19 DIAGNOSIS — Z87891 Personal history of nicotine dependence: Secondary | ICD-10-CM | POA: Diagnosis not present

## 2013-08-19 NOTE — Progress Notes (Signed)
Echocardiogram performed.  

## 2013-08-20 NOTE — Progress Notes (Signed)
Done. 55-60%. Sorry [facility]

## 2013-08-30 ENCOUNTER — Encounter (HOSPITAL_COMMUNITY): Payer: Self-pay | Admitting: Emergency Medicine

## 2013-08-30 ENCOUNTER — Inpatient Hospital Stay (HOSPITAL_COMMUNITY)
Admission: EM | Admit: 2013-08-30 | Discharge: 2013-09-02 | DRG: 251 | Disposition: A | Discharge status: Home / Self Care | Payer: Medicare Other | Attending: Internal Medicine | Admitting: Internal Medicine

## 2013-08-30 ENCOUNTER — Emergency Department (HOSPITAL_COMMUNITY): Payer: Medicare Other

## 2013-08-30 DIAGNOSIS — I2510 Atherosclerotic heart disease of native coronary artery without angina pectoris: Secondary | ICD-10-CM | POA: Diagnosis not present

## 2013-08-30 DIAGNOSIS — F329 Major depressive disorder, single episode, unspecified: Secondary | ICD-10-CM | POA: Diagnosis present

## 2013-08-30 DIAGNOSIS — F3289 Other specified depressive episodes: Secondary | ICD-10-CM | POA: Diagnosis present

## 2013-08-30 DIAGNOSIS — Z87891 Personal history of nicotine dependence: Secondary | ICD-10-CM | POA: Diagnosis not present

## 2013-08-30 DIAGNOSIS — I25810 Atherosclerosis of coronary artery bypass graft(s) without angina pectoris: Secondary | ICD-10-CM | POA: Diagnosis not present

## 2013-08-30 DIAGNOSIS — E119 Type 2 diabetes mellitus without complications: Secondary | ICD-10-CM | POA: Diagnosis present

## 2013-08-30 DIAGNOSIS — K219 Gastro-esophageal reflux disease without esophagitis: Secondary | ICD-10-CM | POA: Diagnosis present

## 2013-08-30 DIAGNOSIS — D649 Anemia, unspecified: Secondary | ICD-10-CM | POA: Diagnosis present

## 2013-08-30 DIAGNOSIS — J4489 Other specified chronic obstructive pulmonary disease: Secondary | ICD-10-CM | POA: Diagnosis present

## 2013-08-30 DIAGNOSIS — Z951 Presence of aortocoronary bypass graft: Secondary | ICD-10-CM

## 2013-08-30 DIAGNOSIS — I252 Old myocardial infarction: Secondary | ICD-10-CM | POA: Diagnosis not present

## 2013-08-30 DIAGNOSIS — Z7901 Long term (current) use of anticoagulants: Secondary | ICD-10-CM

## 2013-08-30 DIAGNOSIS — G4730 Sleep apnea, unspecified: Secondary | ICD-10-CM | POA: Diagnosis present

## 2013-08-30 DIAGNOSIS — Z8249 Family history of ischemic heart disease and other diseases of the circulatory system: Secondary | ICD-10-CM

## 2013-08-30 DIAGNOSIS — I200 Unstable angina: Secondary | ICD-10-CM | POA: Diagnosis not present

## 2013-08-30 DIAGNOSIS — Z881 Allergy status to other antibiotic agents status: Secondary | ICD-10-CM | POA: Diagnosis not present

## 2013-08-30 DIAGNOSIS — M109 Gout, unspecified: Secondary | ICD-10-CM | POA: Diagnosis present

## 2013-08-30 DIAGNOSIS — I701 Atherosclerosis of renal artery: Secondary | ICD-10-CM | POA: Diagnosis not present

## 2013-08-30 DIAGNOSIS — E785 Hyperlipidemia, unspecified: Secondary | ICD-10-CM | POA: Diagnosis present

## 2013-08-30 DIAGNOSIS — G4733 Obstructive sleep apnea (adult) (pediatric): Secondary | ICD-10-CM

## 2013-08-30 DIAGNOSIS — I2582 Chronic total occlusion of coronary artery: Secondary | ICD-10-CM | POA: Diagnosis not present

## 2013-08-30 DIAGNOSIS — I359 Nonrheumatic aortic valve disorder, unspecified: Secondary | ICD-10-CM | POA: Diagnosis not present

## 2013-08-30 DIAGNOSIS — I219 Acute myocardial infarction, unspecified: Secondary | ICD-10-CM | POA: Diagnosis not present

## 2013-08-30 DIAGNOSIS — F411 Generalized anxiety disorder: Secondary | ICD-10-CM | POA: Diagnosis present

## 2013-08-30 DIAGNOSIS — Z9861 Coronary angioplasty status: Secondary | ICD-10-CM

## 2013-08-30 DIAGNOSIS — I4891 Unspecified atrial fibrillation: Secondary | ICD-10-CM | POA: Diagnosis present

## 2013-08-30 DIAGNOSIS — I739 Peripheral vascular disease, unspecified: Secondary | ICD-10-CM | POA: Diagnosis present

## 2013-08-30 DIAGNOSIS — R079 Chest pain, unspecified: Secondary | ICD-10-CM | POA: Diagnosis not present

## 2013-08-30 DIAGNOSIS — I10 Essential (primary) hypertension: Secondary | ICD-10-CM | POA: Diagnosis present

## 2013-08-30 DIAGNOSIS — J449 Chronic obstructive pulmonary disease, unspecified: Secondary | ICD-10-CM | POA: Diagnosis not present

## 2013-08-30 DIAGNOSIS — I482 Chronic atrial fibrillation, unspecified: Secondary | ICD-10-CM

## 2013-08-30 DIAGNOSIS — E1159 Type 2 diabetes mellitus with other circulatory complications: Secondary | ICD-10-CM | POA: Diagnosis present

## 2013-08-30 DIAGNOSIS — I350 Nonrheumatic aortic (valve) stenosis: Secondary | ICD-10-CM | POA: Diagnosis present

## 2013-08-30 DIAGNOSIS — I214 Non-ST elevation (NSTEMI) myocardial infarction: Secondary | ICD-10-CM | POA: Diagnosis not present

## 2013-08-30 HISTORY — DX: Acute myocardial infarction, unspecified: I21.9

## 2013-08-30 LAB — PROTIME-INR
INR: 1.15 (ref 0.00–1.49)
Prothrombin Time: 14.5 seconds (ref 11.6–15.2)

## 2013-08-30 LAB — CBC
HEMATOCRIT: 37.8 % — AB (ref 39.0–52.0)
HEMOGLOBIN: 12.8 g/dL — AB (ref 13.0–17.0)
MCH: 30 pg (ref 26.0–34.0)
MCHC: 33.9 g/dL (ref 30.0–36.0)
MCV: 88.7 fL (ref 78.0–100.0)
Platelets: 216 10*3/uL (ref 150–400)
RBC: 4.26 MIL/uL (ref 4.22–5.81)
RDW: 14.4 % (ref 11.5–15.5)
WBC: 6.9 10*3/uL (ref 4.0–10.5)

## 2013-08-30 LAB — APTT: APTT: 45 s — AB (ref 24–37)

## 2013-08-30 MED ORDER — ASPIRIN 81 MG PO CHEW
324.0000 mg | CHEWABLE_TABLET | Freq: Once | ORAL | Status: AC
  Administered 2013-08-30: 324 mg via ORAL

## 2013-08-30 MED ORDER — NITROGLYCERIN 0.4 MG SL SUBL
0.4000 mg | SUBLINGUAL_TABLET | SUBLINGUAL | Status: DC | PRN
  Administered 2013-08-31 (×2): 0.4 mg via SUBLINGUAL

## 2013-08-30 NOTE — ED Notes (Signed)
Xray finished, pharm tech (and wife) at [facility].

## 2013-08-30 NOTE — ED Notes (Signed)
Pt moved to functional stretcher.

## 2013-08-30 NOTE — ED Provider Notes (Signed)
CSN: [id number]     Arrival date & time 08/30/13  2313 History   First MD Initiated Contact with Patient 08/30/13 2319     Chief Complaint  Patient presents with  . Chest Pain     (Consider location/radiation/quality/duration/timing/severity/associated sxs/prior Treatment) HPI Comments: Mr. Sizemore is a pleasant 80 year old male with a history of significant vascular disease involving multiple coronary arteries, renal arteries and carotid arteries. He has had a bypass graft and has had a recent stress test in 2014 showing a scar but no inducible ischemia. He has not had to use nitroglycerin in many years but has had frequent visits to the cardiologist. He presents today because of chest pain which started yesterday, has been intermittent, involves both the right and left side of the chest, his heavy in nature, the right side of the chest pain radiates to the back and the left side of the chest pain radiates to the left arm and elbow. He has had no trauma, no swelling of the lower extremities and has been taking Pradaxa as prescribed by his physicians.[name]  approximately one month ago he underwent a small bowel resection after a perforation. He has done well since that time, he still has a small area of spontaneously draining fluid from the inferior aspect of the scar but no fevers swelling redness or purulent drainage. He states that the pain in his chest seems to get worse when he lays down but is intermittent regardless of position, he is not doing much exertion given his recent surgery.  Patient is a 80 y.o. male presenting with chest pain. The history is provided by the patient, the spouse, the EMS personnel and medical records.  Chest Pain   Past Medical History  Diagnosis Date  . History of peptic ulcer disease   . Anemia   . Hyperlipidemia   . Aortic stenosis    . Peripheral vascular disease   . Atrial fibrillation   . Carotid stenosis   . Renal artery stenosis   . CAD (coronary  artery disease)   . Hypertension   . Myocardial infarction     X2  . COPD (chronic obstructive pulmonary disease)   . Pneumonia     HX OF  . Sleep apnea     OXYGEN AT NIGHT 2L [place]  . Diabetes mellitus without complication     FASTING 98-120S  . GERD (gastroesophageal reflux disease)   . History of blood transfusion   . Depression   . Anxiety   . Acute myocardial infarction, unspecified site, initial episode of care    Past Surgical History  Procedure Laterality Date  . Coronary artery bypass graft  1994  . Renal artery stent      stenting of the left renal artery as well as a cutting balloon angioplasty for treatment of in-stent restenosis coronary artery bypass grafting in 1994.  [name] Knee surgery  08/2003    left knee/ARTHROSCOPIC  . Removal of bleeding ulcer  1994  . Endarterectomy Right 01/05/2013    Procedure: ENDARTERECTOMY CAROTID;  Surgeon: Rosetta Posner, MD;  Location: [facility];  Service: Vascular;  Laterality: Right;  . Patch angioplasty Right 01/05/2013    Procedure: PATCH ANGIOPLASTY;  Surgeon: Rosetta Posner, MD;  Location: [facility] OR;  Service: Vascular;  Laterality: Right;  . Carotid endarterectomy     Family History  Problem Relation Age of Onset  . Coronary artery disease Mother   . Heart disease Mother   . Coronary artery disease  Father   . Heart disease Father   . Coronary artery disease      13 sibling, almost all have coronary disease and some with premature onset  . Heart disease      13 sibling, almost all have coronary disease and some with premature onset   History  Substance Use Topics  . Smoking status: Former Smoker    Quit date: 04/27/1993  . Smokeless tobacco: Not on file  . Alcohol Use: No    Review of Systems  Cardiovascular: Positive for chest pain.  All other systems reviewed and are negative.      Allergies  Bactrim  Home Medications   No current outpatient prescriptions on file. BP 127/70  Pulse 82  Temp(Src) 98.2 F (36.8 C) (Oral)   Resp 18  Ht 6' (1.829 m)  Wt 235 lb 0.2 oz (106.6 kg)  BMI 31.87 kg/m2  SpO2 97% Physical Exam  Nursing note and vitals reviewed. Constitutional: He appears well-developed and well-nourished. No distress.  HENT:  Head: Normocephalic and atraumatic.  Mouth/Throat: Oropharynx is clear and moist. No oropharyngeal exudate.  Eyes: Conjunctivae and EOM are normal. Pupils are equal, round, and reactive to light. Right eye exhibits no discharge. Left eye exhibits no discharge. No scleral icterus.  Neck: Normal range of motion. Neck supple. No JVD present. No thyromegaly present.  Cardiovascular: Normal rate and intact distal pulses.  Exam reveals no gallop and no friction rub.   Murmur heard. Atrial fibrillation, pulse between 90 and 100  Pulmonary/Chest: Effort normal and breath sounds normal. No respiratory distress. He has no wheezes. He has no rales.  Abdominal: Soft. Bowel sounds are normal. He exhibits no distension and no mass. There is no tenderness.  Musculoskeletal: Normal range of motion. He exhibits no edema and no tenderness.  Lymphadenopathy:    He has no cervical adenopathy.  Neurological: He is alert. Coordination normal.  Skin: Skin is warm and dry. No rash noted. No erythema.  Psychiatric: He has a normal mood and affect. His behavior is normal.    ED Course  Procedures (including critical care time) Labs Review Labs Reviewed  APTT - Abnormal; Notable for the following:    aPTT 45 (*)    All other components within normal limits  CBC - Abnormal; Notable for the following:    Hemoglobin 12.8 (*)    HCT 37.8 (*)    All other components within normal limits  COMPREHENSIVE METABOLIC PANEL - Abnormal; Notable for the following:    Glucose, Bld 162 (*)    GFR calc non Af Amer 79 (*)    All other components within normal limits  TROPONIN I - Abnormal; Notable for the following:    Troponin I 1.42 (*)    All other components within normal limits  TROPONIN I - Abnormal;  Notable for the following:    Troponin I 1.71 (*)    All other components within normal limits  TROPONIN I - Abnormal; Notable for the following:    Troponin I 1.69 (*)    All other components within normal limits  COMPREHENSIVE METABOLIC PANEL - Abnormal; Notable for the following:    Glucose, Bld 123 (*)    GFR calc non Af Amer 81 (*)    All other components within normal limits  CBC WITH DIFFERENTIAL - Abnormal; Notable for the following:    RBC 4.11 (*)    Hemoglobin 12.2 (*)    HCT 36.5 (*)    All other  components within normal limits  HEPARIN LEVEL (UNFRACTIONATED) - Abnormal; Notable for the following:    Heparin Unfractionated 0.14 (*)    All other components within normal limits  GLUCOSE, CAPILLARY - Abnormal; Notable for the following:    Glucose-Capillary 129 (*)    All other components within normal limits  GLUCOSE, CAPILLARY - Abnormal; Notable for the following:    Glucose-Capillary 114 (*)    All other components within normal limits  GLUCOSE, CAPILLARY - Abnormal; Notable for the following:    Glucose-Capillary 152 (*)    All other components within normal limits  GLUCOSE, CAPILLARY - Abnormal; Notable for the following:    Glucose-Capillary 123 (*)    All other components within normal limits  GLUCOSE, CAPILLARY - Abnormal; Notable for the following:    Glucose-Capillary 145 (*)    All other components within normal limits  MRSA PCR SCREENING  PROTIME-INR  TSH  HEPARIN LEVEL (UNFRACTIONATED)  CBC  BASIC METABOLIC PANEL  HEPARIN LEVEL (UNFRACTIONATED)  I-STAT TROPOININ, ED   Imaging Review Dg Chest Portable 1 View  08/31/2013   CLINICAL DATA:  Chest pain  EXAM: PORTABLE CHEST - 1 VIEW  COMPARISON:  07/19/2013  FINDINGS: Chronic cardiopericardial enlargement.  CABG changes.  Pulmonary hyperinflation compatible with COPD. No edema, consolidation, effusion, or pneumothorax.  IMPRESSION: No active disease.   Electronically Signed   By: Jorje Guild M.D.    On: 08/31/2013 00:08    ED ECG REPORT  I personally interpreted this EKG   Date: 09/01/2013   Rate: 98  Rhythm: atrial fibrillation  QRS Axis: left  Intervals: normal  ST/T Wave abnormalities: nonspecific T wave changes  Conduction Disutrbances:none  Narrative Interpretation:   Old EKG Reviewed: unchanged c/w 10/17/12   MDM   Final diagnoses:  Unstable angina    The patient is a soft murmur, he has A. fib, this is a chronic condition for him. I do not see any peripheral edema of any concern, no asymmetry of the legs and he is already anticoagulated. At this time the patient will need evaluation for potential coronary disease, this would be the most concerning potential diagnoses at this time. He has vital signs which are reassuring, his mental status is normal, he does not appear to be in distress, is not diaphoretic, is not pale appearing. EKG, labs, anticipate cardiology consultation.  Discussed with the cardiologist who agrees that the patient should be observed overnight, they will consult, request a medical admission.  D/w hospitalist who will admit.  I am concerned for an Acute Coronary Syndrome - the pt has remained on cardiac monitoring while in the ED, he has been taking his pradaxa and has had anticoagulation from this In addition - he was given ASA in the ED and nitroglycerin with ongoing improvement.  The pt is potentially critically ill with unstable angina.  CRITICAL CARE Performed by: Johnna Acosta Total critical care time: 35 Critical care time was exclusive of separately billable procedures and treating other patients. Critical care was necessary to treat or prevent imminent or life-threatening deterioration. Critical care was time spent personally by me on the following activities: development of treatment plan with patient and/or surrogate as well as nursing, discussions with consultants, evaluation of patient's response to treatment, examination of patient,  obtaining history from patient or surrogate, ordering and performing treatments and interventions, ordering and review of laboratory studies, ordering and review of radiographic studies, pulse oximetry and re-evaluation of patient's condition.  Johnna Acosta, MD 09/01/13 0300

## 2013-08-30 NOTE — ED Notes (Signed)
Blood drawn, pending results, xray at [facility]. No changes. Alert, NAD, calm, interactive, speech clear.

## 2013-08-30 NOTE — ED Notes (Addendum)
Dr. Sabra Heck and lab into room upon arrival, here by EMS from [facility], here from home, wife present. C/o CP, pinpoints pain to bilateral anterior shoulder (clavicle/chest area)  R>L, radiates to R posterior shoulder "like a pinched nerve", strong cardiac hx. H/o afib & DM, afib on monitor, alert, NAD, calm, interactive, resps e/u, speaking in clear complete sentences, no dyspnea noted, denies other sx, color good, cap refill <2sec. NSL placed PTA. ASA 324mg  given. ntg 1 sl given PTA with pain decreasing from 8/10 to 4/10.

## 2013-08-31 ENCOUNTER — Encounter (HOSPITAL_COMMUNITY): Payer: Self-pay | Admitting: Internal Medicine

## 2013-08-31 DIAGNOSIS — I4891 Unspecified atrial fibrillation: Secondary | ICD-10-CM | POA: Diagnosis present

## 2013-08-31 DIAGNOSIS — I200 Unstable angina: Secondary | ICD-10-CM

## 2013-08-31 DIAGNOSIS — D649 Anemia, unspecified: Secondary | ICD-10-CM

## 2013-08-31 DIAGNOSIS — Z7901 Long term (current) use of anticoagulants: Secondary | ICD-10-CM

## 2013-08-31 DIAGNOSIS — I10 Essential (primary) hypertension: Secondary | ICD-10-CM | POA: Diagnosis not present

## 2013-08-31 DIAGNOSIS — I2510 Atherosclerotic heart disease of native coronary artery without angina pectoris: Secondary | ICD-10-CM

## 2013-08-31 DIAGNOSIS — I350 Nonrheumatic aortic (valve) stenosis: Secondary | ICD-10-CM | POA: Diagnosis present

## 2013-08-31 DIAGNOSIS — I359 Nonrheumatic aortic valve disorder, unspecified: Secondary | ICD-10-CM

## 2013-08-31 DIAGNOSIS — I219 Acute myocardial infarction, unspecified: Secondary | ICD-10-CM | POA: Diagnosis present

## 2013-08-31 HISTORY — DX: Unstable angina: I20.0

## 2013-08-31 LAB — COMPREHENSIVE METABOLIC PANEL
ALK PHOS: 72 U/L (ref 39–117)
ALT: 15 U/L (ref 0–53)
ALT: 17 U/L (ref 0–53)
AST: 19 U/L (ref 0–37)
AST: 22 U/L (ref 0–37)
Albumin: 3.5 g/dL (ref 3.5–5.2)
Albumin: 3.7 g/dL (ref 3.5–5.2)
Alkaline Phosphatase: 74 U/L (ref 39–117)
BILIRUBIN TOTAL: 0.3 mg/dL (ref 0.3–1.2)
BUN: 7 mg/dL (ref 6–23)
BUN: 8 mg/dL (ref 6–23)
CO2: 28 mEq/L (ref 19–32)
CO2: 25 meq/L (ref 19–32)
Calcium: 9.1 mg/dL (ref 8.4–10.5)
Calcium: 9.1 mg/dL (ref 8.4–10.5)
Chloride: 101 mEq/L (ref 96–112)
Chloride: 102 mEq/L (ref 96–112)
Creatinine, Ser: 0.85 mg/dL (ref 0.50–1.35)
Creatinine, Ser: 0.9 mg/dL (ref 0.50–1.35)
GFR calc Af Amer: >90 mL/min (ref >90)
GFR calc Af Amer: >90 mL/min (ref >90)
GFR calc non Af Amer: 79 mL/min — ABNORMAL LOW (ref >90)
GFR calc non Af Amer: 81 mL/min — ABNORMAL LOW (ref >90)
GLUCOSE: 123 mg/dL — AB (ref 70–99)
GLUCOSE: 162 mg/dL — AB (ref 70–99)
POTASSIUM: 3.9 meq/L (ref 3.7–5.3)
POTASSIUM: 3.8 meq/L (ref 3.7–5.3)
Sodium: 142 mEq/L (ref 137–147)
Sodium: 143 mEq/L (ref 137–147)
TOTAL PROTEIN: 6.7 g/dL (ref 6.0–8.3)
Total Bilirubin: 0.4 mg/dL (ref 0.3–1.2)
Total Protein: 6.2 g/dL (ref 6.0–8.3)

## 2013-08-31 LAB — CBC WITH DIFFERENTIAL/PLATELET
BASOS ABS: 0 10*3/uL (ref 0.0–0.1)
Basophils Relative: 1 % (ref 0–1)
Eosinophils Absolute: 0.2 10*3/uL (ref 0.0–0.7)
Eosinophils Relative: 3 % (ref 0–5)
HCT: 36.5 % — ABNORMAL LOW (ref 39.0–52.0)
Hemoglobin: 12.2 g/dL — ABNORMAL LOW (ref 13.0–17.0)
Lymphocytes Relative: 28 % (ref 12–46)
Lymphs Abs: 1.8 10*3/uL (ref 0.7–4.0)
MCH: 29.7 pg (ref 26.0–34.0)
MCHC: 33.4 g/dL (ref 30.0–36.0)
MCV: 88.8 fL (ref 78.0–100.0)
Monocytes Absolute: 0.5 10*3/uL (ref 0.1–1.0)
Monocytes Relative: 8 % (ref 3–12)
NEUTROS ABS: 4 10*3/uL (ref 1.7–7.7)
NEUTROS PCT: 60 % (ref 43–77)
Platelets: 207 10*3/uL (ref 150–400)
RBC: 4.11 MIL/uL — ABNORMAL LOW (ref 4.22–5.81)
RDW: 14.4 % (ref 11.5–15.5)
WBC: 6.6 10*3/uL (ref 4.0–10.5)

## 2013-08-31 LAB — GLUCOSE, CAPILLARY
GLUCOSE-CAPILLARY: 114 mg/dL — AB (ref 70–99)
Glucose-Capillary: 129 mg/dL — ABNORMAL HIGH (ref 70–99)
Glucose-Capillary: 152 mg/dL — ABNORMAL HIGH (ref 70–99)
Glucose-Capillary: 123 mg/dL — ABNORMAL HIGH (ref 70–99)
Glucose-Capillary: 145 mg/dL — ABNORMAL HIGH (ref 70–99)

## 2013-08-31 LAB — TROPONIN I
TROPONIN I: 1.42 ng/mL — AB (ref <0.30)
Troponin I: 1.71 ng/mL (ref <0.30)
Troponin I: 1.69 ng/mL (ref <0.30)

## 2013-08-31 LAB — TSH: TSH: 2.567 u[IU]/mL (ref 0.350–4.500)

## 2013-08-31 LAB — I-STAT TROPONIN, ED: TROPONIN I, POC: 0.06 ng/mL (ref 0.00–0.08)

## 2013-08-31 LAB — HEPARIN LEVEL (UNFRACTIONATED): Heparin Unfractionated: 0.14 IU/mL — ABNORMAL LOW (ref 0.30–0.70)

## 2013-08-31 LAB — MRSA PCR SCREENING: MRSA BY PCR: NEGATIVE

## 2013-08-31 MED ORDER — OCUVITE-LUTEIN PO CAPS
1.0000 | ORAL_CAPSULE | Freq: Every day | ORAL | Status: DC
  Administered 2013-08-31 – 2013-09-02 (×3): 1 via ORAL
  Filled 2013-08-31 (×3): qty 1

## 2013-08-31 MED ORDER — SODIUM CHLORIDE 0.9 % IJ SOLN
3.0000 mL | Freq: Two times a day (BID) | INTRAMUSCULAR | Status: DC
  Administered 2013-08-31: 3 mL via INTRAVENOUS

## 2013-08-31 MED ORDER — ACYCLOVIR 400 MG PO TABS
400.0000 mg | ORAL_TABLET | Freq: Two times a day (BID) | ORAL | Status: DC
  Administered 2013-08-31 – 2013-09-02 (×5): 400 mg via ORAL
  Filled 2013-08-31 (×7): qty 1

## 2013-08-31 MED ORDER — MORPHINE SULFATE 2 MG/ML IJ SOLN
2.0000 mg | Freq: Once | INTRAMUSCULAR | Status: AC
  Administered 2013-08-31: 2 mg via INTRAVENOUS
  Filled 2013-08-31: qty 1

## 2013-08-31 MED ORDER — ASPIRIN EC 325 MG PO TBEC
325.0000 mg | DELAYED_RELEASE_TABLET | Freq: Every day | ORAL | Status: DC
  Administered 2013-08-31 – 2013-09-01 (×2): 325 mg via ORAL
  Filled 2013-08-31 (×2): qty 1

## 2013-08-31 MED ORDER — VITAMIN D 1000 UNITS PO TABS
2000.0000 [IU] | ORAL_TABLET | Freq: Every day | ORAL | Status: DC
  Administered 2013-08-31 – 2013-09-02 (×3): 2000 [IU] via ORAL
  Filled 2013-08-31 (×3): qty 2

## 2013-08-31 MED ORDER — SODIUM CHLORIDE 0.9 % IV SOLN: 250.0000 mL | INTRAVENOUS | Status: DC | PRN

## 2013-08-31 MED ORDER — ACETAMINOPHEN 325 MG PO TABS
650.0000 mg | ORAL_TABLET | ORAL | Status: DC | PRN
  Administered 2013-08-31 – 2013-09-01 (×2): 650 mg via ORAL

## 2013-08-31 MED ORDER — TERAZOSIN HCL 5 MG PO CAPS
5.0000 mg | ORAL_CAPSULE | Freq: Every day | ORAL | Status: DC
  Administered 2013-08-31 – 2013-09-01 (×2): 5 mg via ORAL
  Filled 2013-08-31 (×3): qty 1

## 2013-08-31 MED ORDER — BUPROPION HCL ER (XL) 150 MG PO TB24
150.0000 mg | ORAL_TABLET | Freq: Every day | ORAL | Status: DC
  Administered 2013-08-31 – 2013-09-02 (×3): 150 mg via ORAL
  Filled 2013-08-31 (×3): qty 1

## 2013-08-31 MED ORDER — INSULIN ASPART 100 UNIT/ML ~~LOC~~ SOLN
0.0000 [IU] | Freq: Three times a day (TID) | SUBCUTANEOUS | Status: DC
  Administered 2013-08-31: 2 [IU] via SUBCUTANEOUS
  Administered 2013-08-31 – 2013-09-02 (×2): 1 [IU] via SUBCUTANEOUS

## 2013-08-31 MED ORDER — ADULT MULTIVITAMIN W/MINERALS CH
1.0000 | ORAL_TABLET | Freq: Every day | ORAL | Status: DC
  Administered 2013-08-31 – 2013-09-02 (×3): 1 via ORAL
  Filled 2013-08-31 (×3): qty 1

## 2013-08-31 MED ORDER — IPRATROPIUM-ALBUTEROL 0.5-2.5 (3) MG/3ML IN SOLN: 3.0000 mL | Freq: Three times a day (TID) | RESPIRATORY_TRACT | Status: DC | PRN

## 2013-08-31 MED ORDER — ONDANSETRON HCL 4 MG/2ML IJ SOLN: 4.0000 mg | Freq: Four times a day (QID) | INTRAMUSCULAR | Status: DC | PRN

## 2013-08-31 MED ORDER — ACETAMINOPHEN 325 MG PO TABS
650.0000 mg | ORAL_TABLET | Freq: Four times a day (QID) | ORAL | Status: DC | PRN
  Administered 2013-08-31: 650 mg via ORAL
  Filled 2013-08-31 (×3): qty 2

## 2013-08-31 MED ORDER — FERROUS SULFATE 325 (65 FE) MG PO TABS
325.0000 mg | ORAL_TABLET | Freq: Every day | ORAL | Status: DC
  Administered 2013-08-31 – 2013-09-02 (×3): 325 mg via ORAL
  Filled 2013-08-31 (×4): qty 1

## 2013-08-31 MED ORDER — GRX ANALGESIC BALM EX OINT
1.0000 | TOPICAL_OINTMENT | Freq: Four times a day (QID) | CUTANEOUS | Status: DC | PRN
Start: 2013-08-31 — End: 2013-09-02
  Filled 2013-08-31: qty 28

## 2013-08-31 MED ORDER — SODIUM CHLORIDE 0.9 % IJ SOLN
3.0000 mL | Freq: Two times a day (BID) | INTRAMUSCULAR | Status: DC
  Administered 2013-09-01: 3 mL via INTRAVENOUS

## 2013-08-31 MED ORDER — TROLAMINE SALICYLATE 10 % EX CREA: 1.0000 "application " | TOPICAL_CREAM | Freq: Four times a day (QID) | CUTANEOUS | Status: DC | PRN

## 2013-08-31 MED ORDER — HEPARIN (PORCINE) IN NACL 100-0.45 UNIT/ML-% IJ SOLN
1500.0000 [IU]/h | INTRAMUSCULAR | Status: DC
  Administered 2013-08-31: 1200 [IU]/h via INTRAVENOUS
  Administered 2013-08-31: 1500 [IU]/h via INTRAVENOUS
  Filled 2013-08-31 (×3): qty 250

## 2013-08-31 MED ORDER — ASPIRIN 81 MG PO CHEW
81.0000 mg | CHEWABLE_TABLET | ORAL | Status: AC
  Administered 2013-09-01: 81 mg via ORAL
  Filled 2013-08-31: qty 1

## 2013-08-31 MED ORDER — ALLOPURINOL 300 MG PO TABS
300.0000 mg | ORAL_TABLET | Freq: Every day | ORAL | Status: DC
  Administered 2013-08-31 – 2013-09-02 (×3): 300 mg via ORAL
  Filled 2013-08-31 (×3): qty 1

## 2013-08-31 MED ORDER — ALBUTEROL SULFATE (2.5 MG/3ML) 0.083% IN NEBU: 2.5000 mg | INHALATION_SOLUTION | RESPIRATORY_TRACT | Status: DC | PRN

## 2013-08-31 MED ORDER — ATORVASTATIN CALCIUM 40 MG PO TABS
40.0000 mg | ORAL_TABLET | Freq: Every day | ORAL | Status: DC
  Administered 2013-08-31 – 2013-09-01 (×2): 40 mg via ORAL
  Filled 2013-08-31 (×3): qty 1

## 2013-08-31 MED ORDER — FUROSEMIDE 20 MG PO TABS
20.0000 mg | ORAL_TABLET | Freq: Every day | ORAL | Status: DC
  Administered 2013-08-31 – 2013-09-02 (×2): 20 mg via ORAL
  Filled 2013-08-31 (×3): qty 1

## 2013-08-31 MED ORDER — BUDESONIDE-FORMOTEROL FUMARATE 80-4.5 MCG/ACT IN AERO
2.0000 | INHALATION_SPRAY | Freq: Two times a day (BID) | RESPIRATORY_TRACT | Status: DC | PRN
  Filled 2013-08-31: qty 6.9

## 2013-08-31 MED ORDER — ACETAMINOPHEN 650 MG RE SUPP: 650.0000 mg | Freq: Four times a day (QID) | RECTAL | Status: DC | PRN

## 2013-08-31 MED ORDER — ONDANSETRON HCL 4 MG PO TABS: 4.0000 mg | ORAL_TABLET | Freq: Four times a day (QID) | ORAL | Status: DC | PRN

## 2013-08-31 MED ORDER — SODIUM CHLORIDE 0.9 % IJ SOLN: 3.0000 mL | INTRAMUSCULAR | Status: DC | PRN

## 2013-08-31 MED ORDER — SODIUM CHLORIDE 0.9 % IV SOLN
INTRAVENOUS | Status: DC
  Administered 2013-09-01: 07:00:00 via INTRAVENOUS

## 2013-08-31 MED ORDER — ALBUTEROL 90 MCG/ACT IN AERS: 2.0000 | INHALATION_SPRAY | RESPIRATORY_TRACT | Status: DC | PRN

## 2013-08-31 MED ORDER — MORPHINE SULFATE 2 MG/ML IJ SOLN
1.0000 mg | INTRAMUSCULAR | Status: DC | PRN
  Administered 2013-08-31 – 2013-09-01 (×3): 1 mg via INTRAVENOUS
  Filled 2013-08-31 (×3): qty 1

## 2013-08-31 MED ORDER — PANTOPRAZOLE SODIUM 40 MG PO TBEC
40.0000 mg | DELAYED_RELEASE_TABLET | Freq: Every day | ORAL | Status: DC
Start: 2013-08-31 — End: 2013-09-02
  Administered 2013-08-31 – 2013-09-02 (×3): 40 mg via ORAL
  Filled 2013-08-31 (×3): qty 1

## 2013-08-31 MED ORDER — METOPROLOL TARTRATE 50 MG PO TABS
50.0000 mg | ORAL_TABLET | Freq: Two times a day (BID) | ORAL | Status: DC
  Administered 2013-08-31 – 2013-09-02 (×6): 50 mg via ORAL
  Filled 2013-08-31 (×7): qty 1

## 2013-08-31 MED ORDER — BENAZEPRIL HCL 10 MG PO TABS
10.0000 mg | ORAL_TABLET | Freq: Every day | ORAL | Status: DC
  Administered 2013-08-31 – 2013-09-02 (×3): 10 mg via ORAL
  Filled 2013-08-31 (×3): qty 1

## 2013-08-31 MED ORDER — METHOCARBAMOL 750 MG PO TABS
750.0000 mg | ORAL_TABLET | Freq: Two times a day (BID) | ORAL | Status: DC
  Administered 2013-08-31 – 2013-09-02 (×6): 750 mg via ORAL
  Filled 2013-08-31 (×7): qty 1

## 2013-08-31 MED ORDER — AZELASTINE HCL 0.1 % NA SOLN
2.0000 | Freq: Every day | NASAL | Status: DC
  Administered 2013-08-31 – 2013-09-02 (×3): 2 via NASAL
  Filled 2013-08-31: qty 30

## 2013-08-31 NOTE — Care Management Note (Signed)
    Page 1 of 1   08/31/2013     11:39:29 AM   CARE MANAGEMENT NOTE 08/31/2013  Patient:  FEDRICK, SOUTHWARD   Account Number:  [id number]  Date Initiated:  08/31/2013  Documentation initiated by:  Elissa Hefty  Subjective/Objective Assessment:   adm w mi     Action/Plan:   lives w wife, pcp dr Nelda Bucks   Anticipated DC Date:     Anticipated DC Plan:           Choice offered to / List presented to:             Status of service:   Medicare Important Message given?   (If response is "NO", the following Medicare IM given date fields will be blank) Date Medicare IM given:   Date Additional Medicare IM given:    Discharge Disposition:    Per UR Regulation:  Reviewed for med. necessity/level of care/duration of stay  If discussed at [place] of Stay Meetings, dates discussed:    Comments:

## 2013-08-31 NOTE — Progress Notes (Addendum)
ANTICOAGULATION CONSULT NOTE - Follow up [facility] for heparin Indication: chest pain/ACS, afib  Allergies  Allergen Reactions  . Bactrim [Sulfamethoxazole-Tmp Ds] Other (See Comments)    "does not do anything for me"    Patient Measurements: Height: 6' (182.9 cm) Weight: 235 lb 0.2 oz (106.6 kg) IBW/kg (Calculated) : 77.6 Heparin Dosing Weight: 87.3  Vital Signs: Temp: 97.7 F (36.5 C) (03/30 1130) Temp src: Oral (03/30 1130) BP: 105/39 mmHg (03/30 1300) Pulse Rate: 93 (03/30 0400)  Labs:  Recent Labs  08/30/13 2330 08/31/13 0447 08/31/13 0845 08/31/13 1440  HGB 12.8* 12.2*  --   --   HCT 37.8* 36.5*  --   --   PLT 216 207  --   --   APTT 45*  --   --   --   LABPROT 14.5  --   --   --   INR 1.15  --   --   --   HEPARINUNFRC  --   --   --  0.14*  CREATININE 0.90 0.85  --   --   TROPONINI  --  1.42* 1.71*  --     Estimated Creatinine Clearance: 88.9 ml/min (by C-G formula based on Cr of 0.85).   Assessment: 80 yo man with CP on pradaxa PTA was started heparin for CPPradaxa is a direct thrombin inhibitor and will increase the aPTT but will not interfere with anti-Xa assay. First HL today was sub-therapeutic at 0.14. Hgb and Plt this AM were stable. No bleeding noted. Troponins positive. Cath planned for tomorrow AM.   Goal of Therapy:  Heparin level 0.3-0.7 units/ml Monitor platelets by anticoagulation protocol: Yes   Plan:  Increase heparin infusion by ~4 units/kg to 1500 units/hr  Check heparin level 8 hours at midnight  Daily HL and CBC while on heparin. Monitor for bleeding  Albertina Parr, PharmD.  Clinical Pharmacist Pager [phone]  Addum:  Heparin level 0.41 units/ml.  Cont drip at 1500 units/hr.  Recheck level in 6 hours to confirm Thanks for allowing pharmacy to be a part of this patient's care. Excell Seltzer, PharmD Clinical Pharmacist, [phone]

## 2013-08-31 NOTE — Progress Notes (Signed)
ANTICOAGULATION CONSULT NOTE - Initial Consult  Pharmacy Consult for heparin Indication: chest pain/ACS, afib  Allergies  Allergen Reactions  . Bactrim [Sulfamethoxazole-Tmp Ds] Other (See Comments)    "does not do anything for me"    Patient Measurements: Height: 6' (182.9 cm) Weight: 235 lb 0.2 oz (106.6 kg) IBW/kg (Calculated) : 77.6 Heparin Dosing Weight: 87.3  Vital Signs: Temp: 97.9 F (36.6 C) (03/30 0304) Temp src: Oral (03/30 0304) BP: 138/66 mmHg (03/30 0304) Pulse Rate: 95 (03/30 0304)  Labs:  Recent Labs  08/30/13 2330  HGB 12.8*  HCT 37.8*  PLT 216  APTT 45*  LABPROT 14.5  INR 1.15  CREATININE 0.90    Estimated Creatinine Clearance: 84 ml/min (by C-G formula based on Cr of 0.9).   Medical History: Past Medical History  Diagnosis Date  . History of peptic ulcer disease   . Anemia   . Hyperlipidemia   . Aortic stenosis    . Peripheral vascular disease   . Atrial fibrillation   . Carotid stenosis   . Renal artery stenosis   . CAD (coronary artery disease)   . Hypertension   . Myocardial infarction     X2  . COPD (chronic obstructive pulmonary disease)   . Pneumonia     HX OF  . Sleep apnea     OXYGEN AT NIGHT 2L [place]  . Diabetes mellitus without complication     FASTING 98-120S  . GERD (gastroesophageal reflux disease)   . History of blood transfusion   . Depression   . Anxiety     Medications:  Prescriptions prior to admission  Medication Sig Dispense Refill  . acetaminophen (TYLENOL) 500 MG tablet Take 500 mg by mouth every 6 (six) hours as needed for pain.       [name] acyclovir (ZOVIRAX) 800 MG tablet Take 400 mg by mouth 2 (two) times daily.       [name] albuterol (PROVENTIL,VENTOLIN) 90 MCG/ACT inhaler Inhale 2 puffs into the lungs every 4 (four) hours as needed for shortness of breath.       . allopurinol (ZYLOPRIM) 300 MG tablet Take 300 mg by mouth daily.        [name] atorvastatin (LIPITOR) 40 MG tablet Take 40 mg by mouth at bedtime.       [name] azelastine (ASTELIN) 137 MCG/SPRAY nasal spray Place 2 sprays into both nostrils daily.       . benazepril (LOTENSIN) 20 MG tablet Take 10 mg by mouth daily.       . budesonide-formoterol (SYMBICORT) 80-4.5 MCG/ACT inhaler Inhale 2 puffs into the lungs 2 (two) times daily as needed (bronchitis).      [name] buPROPion (WELLBUTRIN XL) 150 MG 24 hr tablet Take 150 mg by mouth daily.      . Cholecalciferol (VITAMIN D) 2000 UNITS CAPS Take 2,000 Units by mouth daily.      . dabigatran (PRADAXA) 150 MG CAPS Take 150 mg by mouth 2 (two) times daily.       . ferrous sulfate 325 (65 FE) MG tablet Take 325 mg by mouth daily with breakfast.       . furosemide (LASIX) 20 MG tablet Take 20 mg by mouth daily.        [name] ipratropium-albuterol (DUONEB) 0.5-2.5 (3) MG/3ML SOLN Take 3 mLs by nebulization 3 (three) times daily as needed (for bronchitis or other virus that affects breathing).       . lansoprazole (PREVACID) 30 MG capsule Take 30 mg by  mouth daily.        . methocarbamol (ROBAXIN) 750 MG tablet Take 750 mg by mouth 2 (two) times daily.       . metoprolol (LOPRESSOR) 50 MG tablet Take 1 tablet (50 mg total) by mouth 2 (two) times daily.  180 tablet  4  . Multiple Vitamin (MULTIVITAMIN WITH MINERALS) TABS Take 1 tablet by mouth daily.      . multivitamin-lutein (OCUVITE-LUTEIN) CAPS Take 1 capsule by mouth daily.      [name] terazosin (HYTRIN) 5 MG capsule Take 5 mg by mouth at bedtime.       . trolamine salicylate (ASPERCREME) 10 % cream Apply 1 application topically 4 (four) times daily as needed (pain).         Assessment: 80 yo man on pradaxa PTA to hold pradaxa and start heparin for CP.  His last dose was 6 pm on 3/29.  Baseline Hg 12.8, PTLC wnl.  Pradaxa is a direct thrombin inhibitor and will increase the aPTT but will not interfere with anti-Xa assay. (http://asheducationbook.hematologylibrary.org/content/2012/1/460.full)  Goal of Therapy:  Heparin level 0.3-0.7 units/ml Monitor platelets by  anticoagulation protocol: Yes   Plan:  Start heparin 12 hours after last dose pradaxa with no bolus at 1200 units/hr Check heparin level 8 hours after start. Daily HL and CBC while on heparin. Monitor for bleeding  Thanks for allowing pharmacy to be a part of this patient's care. Excell Seltzer, PharmD Clinical Pharmacist, [phone] 08/31/2013,3:29 AM

## 2013-08-31 NOTE — Progress Notes (Signed)
CRITICAL VALUE ALERT  Critical value received: trop 1.42  Date of notification:  08/31/13  Time of notification:  [id number]  Critical value read back:yes  Nurse who received alert:  Saunders Revel  MD notified (1st page):  [facility]  Time of first page:  0545  MD notified (2nd page):  Time of second page:  Responding MD:     Time MD responded:

## 2013-08-31 NOTE — Interval H&P Note (Signed)
Cath Lab Visit (complete for each Cath Lab visit)  Clinical Evaluation Leading to the Procedure:   ACS: yes  Non-ACS:    Anginal Classification: CCS III  Anti-ischemic medical therapy: Maximal Therapy (2 or more classes of medications)  Non-Invasive Test Results: No non-invasive testing performed  Prior CABG: Previous CABG      History and Physical Interval Note:  08/31/2013 7:39 PM  [name]  has presented today for surgery, with the diagnosis of cp  The various methods of treatment have been discussed with the patient and family. After consideration of risks, benefits and other options for treatment, the patient has consented to  Procedure(s): LEFT HEART CATHETERIZATION WITH CORONARY/GRAFT ANGIOGRAM (N/A) as a surgical intervention .  The patient's history has been reviewed, patient examined, no change in status, stable for surgery.  I have reviewed the patient's chart and labs.  Questions were answered to the patient's satisfaction.     Sinclair Grooms

## 2013-08-31 NOTE — Progress Notes (Signed)
Patient ID: [name]  male  [id number]    DOB: [birth year]    DOA: 08/30/2013  PCP: Nicoletta Dress, MD  Assessment/Plan: Principal Problem:   Unstable angina/ NSTEMI: History of coronary disease, CABG '94, low risk Myoview May 2014 - Patient seen and examined, no further chest pain - Troponins positive, cardiology consulted, currently on heparin drip - Planned cardiac cath tomorrow, had pradaxa last night  Active Problems:   Essential hypertension, benign - atable    Chronic atrial fibrillation - Rate controlled, on heparin drip    Aortic stenosis- moderate  DVT Prophylaxis:  Code Status: Full code  Family Communication:  Disposition:  Consultants:  Cardiology  Procedures:  None  Antibiotics:  None    Subjective: Patient seen and examined, states chest pain improved  Objective: Weight change:   Intake/Output Summary (Last 24 hours) at 08/31/13 1354 Last data filed at 08/31/13 1300  Gross per 24 hour  Intake    649 ml  Output    950 ml  Net   -301 ml   Blood pressure 105/39, pulse 93, temperature 97.7 F (36.5 C), temperature source Oral, resp. rate 17, height 6' (1.829 m), weight 106.6 kg (235 lb 0.2 oz), SpO2 95.00%.  Physical Exam: General: Alert and awake, oriented x3, not in any acute distress. CVS: S1-S2 clear, no murmur rubs or gallops Chest: clear to auscultation bilaterally, no wheezing, rales or rhonchi Abdomen: soft nontender, nondistended, normal bowel sounds, dressing from the recent surgery, the staples, discharge from the surgical site Extremities: no cyanosis, clubbing or edema noted bilaterally Neuro: Cranial nerves II-XII intact, no focal neurological deficits  Lab Results: Basic Metabolic Panel:  Recent Labs Lab 08/30/13 2330 08/31/13 0447  NA 143 142  K 3.8 3.9  CL 101 102  CO2 25 28  GLUCOSE 162* 123*  BUN 8 7  CREATININE 0.90 0.85  CALCIUM 9.1 9.1   Liver Function Tests:  Recent Labs Lab  08/30/13 2330 08/31/13 0447  AST 22 19  ALT 17 15  ALKPHOS 74 72  BILITOT 0.4 0.3  PROT 6.7 6.2  ALBUMIN 3.7 3.5   No results found for this basename: LIPASE, AMYLASE,  in the last 168 hours No results found for this basename: AMMONIA,  in the last 168 hours CBC:  Recent Labs Lab 08/30/13 2330 08/31/13 0447  WBC 6.9 6.6  NEUTROABS  --  4.0  HGB 12.8* 12.2*  HCT 37.8* 36.5*  MCV 88.7 88.8  PLT 216 207   Cardiac Enzymes:  Recent Labs Lab 08/31/13 0447 08/31/13 0845  TROPONINI 1.42* 1.71*   BNP: No components found with this basename: POCBNP,  CBG:  Recent Labs Lab 08/31/13 0359 08/31/13 0740 08/31/13 1133  GLUCAP 129* 114* 152*     Micro Results: Recent Results (from the past 240 hour(s))  MRSA PCR SCREENING     Status: None   Collection Time    08/31/13  2:50 AM      Result Value Ref Range Status   MRSA by PCR NEGATIVE  NEGATIVE Final   Comment:            The GeneXpert MRSA Assay (FDA     approved for NASAL specimens     only), is one component of a     comprehensive MRSA colonization     surveillance program. It is not     intended to diagnose MRSA     infection nor to guide or     monitor  treatment for     MRSA infections.    Studies/Results: Dg Chest Portable 1 View  08/31/2013   CLINICAL DATA:  Chest pain  EXAM: PORTABLE CHEST - 1 VIEW  COMPARISON:  07/19/2013  FINDINGS: Chronic cardiopericardial enlargement.  CABG changes.  Pulmonary hyperinflation compatible with COPD. No edema, consolidation, effusion, or pneumothorax.  IMPRESSION: No active disease.   Electronically Signed   By: Jorje Guild M.D.   On: 08/31/2013 00:08    Medications: Scheduled Meds: . acyclovir  400 mg Oral BID  . allopurinol  300 mg Oral Daily  . aspirin EC  325 mg Oral Daily  . atorvastatin  40 mg Oral QHS  . azelastine  2 spray Each Nare Daily  . benazepril  10 mg Oral Daily  . buPROPion  150 mg Oral Daily  . cholecalciferol  2,000 Units Oral Daily  .  ferrous sulfate  325 mg Oral Q breakfast  . furosemide  20 mg Oral Daily  . insulin aspart  0-9 Units Subcutaneous TID WC  . methocarbamol  750 mg Oral BID  . metoprolol  50 mg Oral BID  . multivitamin with minerals  1 tablet Oral Daily  . multivitamin-lutein  1 capsule Oral Daily  . pantoprazole  40 mg Oral Daily  . sodium chloride  3 mL Intravenous Q12H  . sodium chloride  3 mL Intravenous Q12H  . terazosin  5 mg Oral QHS      LOS: 1 day   RAI,RIPUDEEP M.D. [facility] 08/31/2013, 1:54 PM Pager: [phone]  If 7PM-7AM, please contact night-coverage [email or web address] Password [email or web address]

## 2013-08-31 NOTE — Progress Notes (Addendum)
Nutrition Brief Note  Patient identified on the Malnutrition Screening Tool (MST) Report  Wt Readings from Last 15 Encounters:  08/31/13 235 lb 0.2 oz (106.6 kg)  07/06/13 249 lb (112.946 kg)  06/26/13 249 lb 6.4 oz (113.127 kg)  01/27/13 241 lb (109.317 kg)  01/05/13 245 lb 2.4 oz (111.2 kg)  01/05/13 245 lb 2.4 oz (111.2 kg)  12/23/12 239 lb 3.2 oz (108.5 kg)  12/02/12 245 lb 14.4 oz (111.54 kg)  11/12/12 245 lb (111.131 kg)  10/17/12 248 lb (112.492 kg)  10/18/11 250 lb (113.399 kg)  04/05/11 239 lb (108.41 kg)  09/18/10 253 lb (114.76 kg)  03/13/10 262 lb (118.842 kg)  09/08/09 273 lb (123.832 kg)    Body mass index is 31.87 kg/(m^2). Patient meets criteria for overweight based on current BMI.   Current diet order is Heart Healthy, patient is consuming approximately 100% of meals at this time. Labs and medications reviewed.   Pt states he has a good appetite and intake.  No nutrition related concerns.  Wife at bedside reports adequately managing nutrition at home. No nutrition interventions warranted at this time. If nutrition issues arise, please consult RD.   Brynda Greathouse, MS RD LDN Clinical Inpatient Dietitian Pager: [phone] Weekend/After hours pager: [phone]

## 2013-08-31 NOTE — H&P (View-Only) (Signed)
SUBJECTIVE:  No chest pain. No further bowel surgery planned  OBJECTIVE:   Vitals:   Filed Vitals:   08/31/13 0600 08/31/13 0700 08/31/13 0740 08/31/13 0800  BP: 145/65 117/64  132/63  Pulse:      Temp:   98.8 F (37.1 C)   TempSrc:   Oral   Resp: 16 18  11   Height:      Weight:      SpO2: 96% 94%  97%   I&O's:   Intake/Output Summary (Last 24 hours) at 08/31/13 1046 Last data filed at 08/31/13 0800  Gross per 24 hour  Intake    109 ml  Output    600 ml  Net   -491 ml   TELEMETRY: Reviewed telemetry pt in AFib, rate controlled.:     PHYSICAL EXAM General: Well developed, well nourished, in no acute distress Head: Eyes PERRLA, No xanthomas.   Normal cephalic and atramatic  Lungs:   Clear bilaterally to auscultation and percussion. Heart:  Irregularly irregular S1 S2  No JVD.  2/6 systolic murmur. Abdomen: Bowel sounds are positive, abdomen soft and non-tender, dressing intact Msk:   Normal strength and tone for age. Extremities:  No edema.   Neuro: Alert and oriented X 3. Psych:  Normal affect, responds appropriately   LABS: Basic Metabolic Panel:  Recent Labs  08/30/13 2330 08/31/13 0447  NA 143 142  K 3.8 3.9  CL 101 102  CO2 25 28  GLUCOSE 162* 123*  BUN 8 7  CREATININE 0.90 0.85  CALCIUM 9.1 9.1   Liver Function Tests:  Recent Labs  08/30/13 2330 08/31/13 0447  AST 22 19  ALT 17 15  ALKPHOS 74 72  BILITOT 0.4 0.3  PROT 6.7 6.2  ALBUMIN 3.7 3.5   No results found for this basename: LIPASE, AMYLASE,  in the last 72 hours CBC:  Recent Labs  08/30/13 2330 08/31/13 0447  WBC 6.9 6.6  NEUTROABS  --  4.0  HGB 12.8* 12.2*  HCT 37.8* 36.5*  MCV 88.7 88.8  PLT 216 207   Cardiac Enzymes:  Recent Labs  08/31/13 0447 08/31/13 0845  TROPONINI 1.42* 1.71*   BNP: No components found with this basename: POCBNP,  D-Dimer: No results found for this basename: DDIMER,  in the last 72 hours Hemoglobin A1C: No results found for this  basename: HGBA1C,  in the last 72 hours Fasting Lipid Panel: No results found for this basename: CHOL, HDL, LDLCALC, TRIG, CHOLHDL, LDLDIRECT,  in the last 72 hours Thyroid Function Tests: No results found for this basename: TSH, T4TOTAL, FREET3, T3FREE, THYROIDAB,  in the last 72 hours Anemia Panel: No results found for this basename: VITAMINB12, FOLATE, FERRITIN, TIBC, IRON, RETICCTPCT,  in the last 72 hours Coag Panel:   Lab Results  Component Value Date   INR 1.15 08/30/2013   INR 1.20 12/23/2012   INR 1.1 09/28/2009   PROTIME 20.6 11/04/2008    RADIOLOGY: Dg Chest Portable 1 View  08/31/2013   CLINICAL DATA:  Chest pain  EXAM: PORTABLE CHEST - 1 VIEW  COMPARISON:  07/19/2013  FINDINGS: Chronic cardiopericardial enlargement.  CABG changes.  Pulmonary hyperinflation compatible with COPD. No edema, consolidation, effusion, or pneumothorax.  IMPRESSION: No active disease.   Electronically Signed   By: Jorje Guild M.D.   On: 08/31/2013 00:08      ASSESSMENT: NSTEMI, AFib  PLAN:  Pain free now.  Postpone cath until tomorrow since he had pradaxa last night.  Plan  for cath in the AM.  Questions about cath answered.  Rate controlled AFib.  If stent needed, he may need Plavix and Coumadin.  Regardless, given his age, he may need to have pradaxa switched.  Will leave this up to Dr. Stanford Breed.   Heparin gtt for now.   No further surgery planned so DES should be ok.   Aortic stenosis: moderate in the past.   Jettie Booze., MD  08/31/2013  10:46 AM

## 2013-08-31 NOTE — ED Notes (Signed)
Lab at [facility] for 2nd troponin

## 2013-08-31 NOTE — H&P (Signed)
[facility] History and Physical  [name] [id number] DOB: 1932/01/16 DOA: 08/30/2013  Referring physician: ER physician. PCP: Nicoletta Dress, MD   Chief Complaint: Chest pain.  HPI: [name] is a 80 y.o. male with history of CAD status post CABG, hypertension, peripheral vascular disease, chronic atrial fibrillation on Pradaxa presents to the ER because of chest pain. Patient has been having chest pain off and on last 2 days which has been retrosternal sometimes radiating to the back more on the right side has no associated shortness of breath diaphoresis nausea vomiting or any palpitations. Patient's sister has no relation to exertion. In the ER patient has been improved with nitroglycerin pain or limitations but still has mild pain persisting. EKG and cardiac markers were unremarkable. On-call cardiologist has been consulted. Patient admitted for further management. Patient has had recent abdominal surgery for perforated viscus last month.  Review of Systems: As presented in the history of presenting illness, rest negative.  Past Medical History  Diagnosis Date  . History of peptic ulcer disease   . Anemia   . Hyperlipidemia   . Aortic stenosis    . Peripheral vascular disease   . Atrial fibrillation   . Carotid stenosis   . Renal artery stenosis   . CAD (coronary artery disease)   . Hypertension   . Myocardial infarction     X2  . COPD (chronic obstructive pulmonary disease)   . Pneumonia     HX OF  . Sleep apnea     OXYGEN AT NIGHT 2L [place]  . Diabetes mellitus without complication     FASTING 98-120S  . GERD (gastroesophageal reflux disease)   . History of blood transfusion   . Depression   . Anxiety    Past Surgical History  Procedure Laterality Date  . Coronary artery bypass graft  1994  . Renal artery stent      stenting of the left renal artery as well as a cutting balloon angioplasty for treatment of in-stent restenosis coronary artery  bypass grafting in 1994.  [name] Knee surgery  08/2003    left knee/ARTHROSCOPIC  . Removal of bleeding ulcer  1994  . Endarterectomy Right 01/05/2013    Procedure: ENDARTERECTOMY CAROTID;  Surgeon: Rosetta Posner, MD;  Location: [facility];  Service: Vascular;  Laterality: Right;  . Patch angioplasty Right 01/05/2013    Procedure: PATCH ANGIOPLASTY;  Surgeon: Rosetta Posner, MD;  Location: [facility] OR;  Service: Vascular;  Laterality: Right;  . Carotid endarterectomy     Social History:  reports that he quit smoking about 20 years ago. He does not have any smokeless tobacco history on file. He reports that he does not drink alcohol or use illicit drugs. Where does patient live home. Can patient participate in ADLs? Yes.  Allergies  Allergen Reactions  . Bactrim [Sulfamethoxazole-Tmp Ds] Other (See Comments)    "does not do anything for me"    Family History:  Family History  Problem Relation Age of Onset  . Coronary artery disease Mother   . Heart disease Mother   . Coronary artery disease Father   . Heart disease Father   . Coronary artery disease      13 sibling, almost all have coronary disease and some with premature onset  . Heart disease      13 sibling, almost all have coronary disease and some with premature onset      Prior to Admission medications   Medication Sig Start  Date End Date Taking? Authorizing Provider  acetaminophen (TYLENOL) 500 MG tablet Take 500 mg by mouth every 6 (six) hours as needed for pain.    Yes Historical Provider, MD  acyclovir (ZOVIRAX) 800 MG tablet Take 400 mg by mouth 2 (two) times daily.    Yes Historical Provider, MD  albuterol (PROVENTIL,VENTOLIN) 90 MCG/ACT inhaler Inhale 2 puffs into the lungs every 4 (four) hours as needed for shortness of breath.    Yes Historical Provider, MD  allopurinol (ZYLOPRIM) 300 MG tablet Take 300 mg by mouth daily.     Yes Historical Provider, MD  atorvastatin (LIPITOR) 40 MG tablet Take 40 mg by mouth at bedtime.   Yes  Historical Provider, MD  azelastine (ASTELIN) 137 MCG/SPRAY nasal spray Place 2 sprays into both nostrils daily.    Yes Historical Provider, MD  benazepril (LOTENSIN) 20 MG tablet Take 10 mg by mouth daily.    Yes Historical Provider, MD  budesonide-formoterol (SYMBICORT) 80-4.5 MCG/ACT inhaler Inhale 2 puffs into the lungs 2 (two) times daily as needed (bronchitis).   Yes Historical Provider, MD  buPROPion (WELLBUTRIN XL) 150 MG 24 hr tablet Take 150 mg by mouth daily.   Yes Historical Provider, MD  Cholecalciferol (VITAMIN D) 2000 UNITS CAPS Take 2,000 Units by mouth daily.   Yes Historical Provider, MD  dabigatran (PRADAXA) 150 MG CAPS Take 150 mg by mouth 2 (two) times daily.    Yes Historical Provider, MD  ferrous sulfate 325 (65 FE) MG tablet Take 325 mg by mouth daily with breakfast.    Yes Historical Provider, MD  furosemide (LASIX) 20 MG tablet Take 20 mg by mouth daily.     Yes Historical Provider, MD  ipratropium-albuterol (DUONEB) 0.5-2.5 (3) MG/3ML SOLN Take 3 mLs by nebulization 3 (three) times daily as needed (for bronchitis or other virus that affects breathing).    Yes Historical Provider, MD  lansoprazole (PREVACID) 30 MG capsule Take 30 mg by mouth daily.     Yes Historical Provider, MD  methocarbamol (ROBAXIN) 750 MG tablet Take 750 mg by mouth 2 (two) times daily.    Yes Historical Provider, MD  metoprolol (LOPRESSOR) 50 MG tablet Take 1 tablet (50 mg total) by mouth 2 (two) times daily. 07/06/13  Yes Lelon Perla, MD  Multiple Vitamin (MULTIVITAMIN WITH MINERALS) TABS Take 1 tablet by mouth daily.   Yes Historical Provider, MD  multivitamin-lutein (OCUVITE-LUTEIN) CAPS Take 1 capsule by mouth daily.   Yes Historical Provider, MD  terazosin (HYTRIN) 5 MG capsule Take 5 mg by mouth at bedtime.    Yes Historical Provider, MD  trolamine salicylate (ASPERCREME) 10 % cream Apply 1 application topically 4 (four) times daily as needed (pain).    Yes Historical Provider, MD     Physical Exam: Filed Vitals:   08/31/13 0200 08/31/13 0215 08/31/13 0230 08/31/13 0250  BP: 126/65 116/67 125/71 179/79  Pulse: 85 87 84   Temp:    97.9 F (36.6 C)  TempSrc:    Oral  Resp: 13 14 13 13   Height:    6' (1.829 m)  Weight:    106.6 kg (235 lb 0.2 oz)  SpO2: 98% 97% 97% 98%     General:  Well-developed and nourished.  Eyes: Anicteric no pallor.  ENT: No discharge from ears eyes nose mouth.  Neck: No mass felt.  Cardiovascular: S1-S2 heard.  Respiratory: No rhonchi or crepitations.  Abdomen: Soft nontender. There is scar marks and dressing from recent surgery.  Mild discharge from the surgical site which patient states surgeon is aware.  Skin: Scar marker the abdominal site from recent surgery with dressing and mild discharge which patient states patient's surgeon is aware.  Musculoskeletal: No edema.  Psychiatric: Appears normal.  Neurologic: Alert awake oriented to time place and person. Moves all extremities.  Labs on Admission:  Basic Metabolic Panel:  Recent Labs Lab 08/30/13 2330  NA 143  K 3.8  CL 101  CO2 25  GLUCOSE 162*  BUN 8  CREATININE 0.90  CALCIUM 9.1   Liver Function Tests:  Recent Labs Lab 08/30/13 2330  AST 22  ALT 17  ALKPHOS 74  BILITOT 0.4  PROT 6.7  ALBUMIN 3.7   No results found for this basename: LIPASE, AMYLASE,  in the last 168 hours No results found for this basename: AMMONIA,  in the last 168 hours CBC:  Recent Labs Lab 08/30/13 2330  WBC 6.9  HGB 12.8*  HCT 37.8*  MCV 88.7  PLT 216   Cardiac Enzymes: No results found for this basename: CKTOTAL, CKMB, CKMBINDEX, TROPONINI,  in the last 168 hours  BNP (last 3 results) No results found for this basename: PROBNP,  in the last 8760 hours CBG: No results found for this basename: GLUCAP,  in the last 168 hours  Radiological Exams on Admission: Dg Chest Portable 1 View  08/31/2013   CLINICAL DATA:  Chest pain  EXAM: PORTABLE CHEST - 1 VIEW   COMPARISON:  07/19/2013  FINDINGS: Chronic cardiopericardial enlargement.  CABG changes.  Pulmonary hyperinflation compatible with COPD. No edema, consolidation, effusion, or pneumothorax.  IMPRESSION: No active disease.   Electronically Signed   By: Jorje Guild M.D.   On: 08/31/2013 00:08    EKG: Independently reviewed. Atrial fibrillation rate controlled.  Assessment/Plan Principal Problem:   Unstable angina Active Problems:   Essential hypertension, benign   CAD - CABG '94, low risk Myoview May 2014   Chronic atrial fibrillation   1. Chest pain history of CAD status post CABG concerning for unstable angina - continue with nitroglycerin, aspirin. Cycle cardiac markers. I have discussed with on-call cardiologist and at this time in anticipation of possible procedure Pradaxa will be on hold and patient will be placed on heparin and kept n.p.o. Patient has had a stress test last year which was negative for ischemia. 2. Atrial fibrillation rate controlled - Pradaxa on hold and patient later heparin in anticipation of procedure. 3. Hypertension - continue present medications. 4. History of diabetes mellitus - place on sliding-scale coverage. 5. Recent abdominal surgery - patient has mild discharge from surgical site which patient states patient's surgeon is aware. Denies any abdominal pain. 6. Peripheral vascular disease. 7. History of gout - continue present medications.  I have discussed with on-call cardiologist.  Code Status: Full code.  Family Communication: Patient's wife.  Disposition Plan: Admit to inpatient.    KAKRAKANDY,ARSHAD N. [facility] Pager [phone].  If 7PM-7AM, please contact night-coverage [email or web address] Password [email or web address] 08/31/2013, 2:57 AM

## 2013-08-31 NOTE — Consult Note (Signed)
Cardiology Consultation Note  Patient ID: [name], MRN: [id number], DOB/AGE: 1931-08-10 80 y.o. Admit date: 08/30/2013   Date of Consult: 08/31/2013 Primary Care Provider: Dr D. Shultz  Primary Cardiologist: Dr Stanford Breed   Chief Complaint: chest pain  Reason for Consult: chest pain    80 y/o male with h/o of CAD, s/p CABG x 3 in 1994, chronic rate controlled atrial fibrillation, PVD ( renal artery stenosis and CVA )  and moderate AS , presents for evaluation of chest pain   HPI: pt states that earlier in the day he experienced sharp pain across his chest radiating to his left arm and back . This improved with SL nTG . He denies any associated symptoms. No  SOB , orthopnea, PND , LE edema , Syncope ,claudcation , focal weakness, or bleeding diathesis . He is currently recovering from his bowel surgery last month and is not very active     Past Medical History  Diagnosis Date  . History of peptic ulcer disease   . Anemia   . Hyperlipidemia   . Aortic stenosis    . Peripheral vascular disease   . Atrial fibrillation   . Carotid stenosis   . Renal artery stenosis   . CAD (coronary artery disease)   . Hypertension   . Myocardial infarction     X2  . COPD (chronic obstructive pulmonary disease)   . Pneumonia     HX OF  . Sleep apnea     OXYGEN AT NIGHT 2L [place]  . Diabetes mellitus without complication     FASTING 98-120S  . GERD (gastroesophageal reflux disease)   . History of blood transfusion   . Depression   . Anxiety       Most Recent Cardiac Studies: lexi myoview 10/2012 Impression  Exercise Capacity: Lexiscan with no exercise.  BP Response: Normal blood pressure response.  Clinical Symptoms: Patient felt flushed in the chest  ECG Impression: No significant ST segment change suggestive of ischemia.  Comparison with Prior Nuclear Study: No significant change from previous study  Overall Impression: There is no definite scar or ischemia. There is slight decreased  activity in the inferior wall that may be from attenuation. The wall motion abnormality is probably related to prior CABG. This is a low risk scan.  LV Ejection Fraction: 50%. LV Wall Motion: There is mild dyssynergy of the septum (CABG).   Echo 08/2013 - Left ventricle: There was moderate concentric hypertrophy. Systolic function was normal. The estimated ejection fraction was in the range of 55% to 60%. Wall motion was normal; there were no regional wall motion abnormalities. Doppler parameters are consistent with elevated mean left atrial filling pressure. - Aortic valve: Noncoronary cusp immobility was noted. There was moderate stenosis. Mild regurgitation. - Mitral valve: Calcified annulus. Mildly thickened leaflets . - Left atrium: The atrium was moderately to severely dilated. - Right atrium: The atrium was moderately dilated. - Atrial septum: No defect or patent foramen ovale was identified.     Surgical History:  Past Surgical History  Procedure Laterality Date  . Coronary artery bypass graft  1994  . Renal artery stent      stenting of the left renal artery as well as a cutting balloon angioplasty for treatment of in-stent restenosis coronary artery bypass grafting in 1994.  [name] Knee surgery  08/2003    left knee/ARTHROSCOPIC  . Removal of bleeding ulcer  1994  . Endarterectomy Right 01/05/2013    Procedure: ENDARTERECTOMY CAROTID;  Surgeon: Rosetta Posner, MD;  Location: [facility];  Service: Vascular;  Laterality: Right;  . Patch angioplasty Right 01/05/2013    Procedure: PATCH ANGIOPLASTY;  Surgeon: Rosetta Posner, MD;  Location: [facility] OR;  Service: Vascular;  Laterality: Right;  . Carotid endarterectomy       Home Meds: Prior to Admission medications   Medication Sig Start Date End Date Taking? Authorizing Provider  acetaminophen (TYLENOL) 500 MG tablet Take 500 mg by mouth every 6 (six) hours as needed for pain.    Yes Historical Provider, MD  acyclovir (ZOVIRAX) 800 MG tablet  Take 400 mg by mouth 2 (two) times daily.    Yes Historical Provider, MD  albuterol (PROVENTIL,VENTOLIN) 90 MCG/ACT inhaler Inhale 2 puffs into the lungs every 4 (four) hours as needed for shortness of breath.    Yes Historical Provider, MD  allopurinol (ZYLOPRIM) 300 MG tablet Take 300 mg by mouth daily.     Yes Historical Provider, MD  atorvastatin (LIPITOR) 40 MG tablet Take 40 mg by mouth at bedtime.   Yes Historical Provider, MD  azelastine (ASTELIN) 137 MCG/SPRAY nasal spray Place 2 sprays into both nostrils daily.    Yes Historical Provider, MD  benazepril (LOTENSIN) 20 MG tablet Take 10 mg by mouth daily.    Yes Historical Provider, MD  budesonide-formoterol (SYMBICORT) 80-4.5 MCG/ACT inhaler Inhale 2 puffs into the lungs 2 (two) times daily as needed (bronchitis).   Yes Historical Provider, MD  buPROPion (WELLBUTRIN XL) 150 MG 24 hr tablet Take 150 mg by mouth daily.   Yes Historical Provider, MD  Cholecalciferol (VITAMIN D) 2000 UNITS CAPS Take 2,000 Units by mouth daily.   Yes Historical Provider, MD  dabigatran (PRADAXA) 150 MG CAPS Take 150 mg by mouth 2 (two) times daily.    Yes Historical Provider, MD  ferrous sulfate 325 (65 FE) MG tablet Take 325 mg by mouth daily with breakfast.    Yes Historical Provider, MD  furosemide (LASIX) 20 MG tablet Take 20 mg by mouth daily.     Yes Historical Provider, MD  ipratropium-albuterol (DUONEB) 0.5-2.5 (3) MG/3ML SOLN Take 3 mLs by nebulization 3 (three) times daily as needed (for bronchitis or other virus that affects breathing).    Yes Historical Provider, MD  lansoprazole (PREVACID) 30 MG capsule Take 30 mg by mouth daily.     Yes Historical Provider, MD  methocarbamol (ROBAXIN) 750 MG tablet Take 750 mg by mouth 2 (two) times daily.    Yes Historical Provider, MD  metoprolol (LOPRESSOR) 50 MG tablet Take 1 tablet (50 mg total) by mouth 2 (two) times daily. 07/06/13  Yes Lelon Perla, MD  Multiple Vitamin (MULTIVITAMIN WITH MINERALS) TABS  Take 1 tablet by mouth daily.   Yes Historical Provider, MD  multivitamin-lutein (OCUVITE-LUTEIN) CAPS Take 1 capsule by mouth daily.   Yes Historical Provider, MD  terazosin (HYTRIN) 5 MG capsule Take 5 mg by mouth at bedtime.    Yes Historical Provider, MD  trolamine salicylate (ASPERCREME) 10 % cream Apply 1 application topically 4 (four) times daily as needed (pain).    Yes Historical Provider, MD    Inpatient Medications:  . acyclovir  400 mg Oral BID  . allopurinol  300 mg Oral Daily  . aspirin EC  325 mg Oral Daily  . atorvastatin  40 mg Oral QHS  . azelastine  2 spray Each Nare Daily  . benazepril  10 mg Oral Daily  . buPROPion  150 mg Oral  Daily  . cholecalciferol  2,000 Units Oral Daily  . ferrous sulfate  325 mg Oral Q breakfast  . furosemide  20 mg Oral Daily  . insulin aspart  0-9 Units Subcutaneous TID WC  . methocarbamol  750 mg Oral BID  . metoprolol  50 mg Oral BID  . multivitamin with minerals  1 tablet Oral Daily  . multivitamin-lutein  1 capsule Oral Daily  . pantoprazole  40 mg Oral Daily  . sodium chloride  3 mL Intravenous Q12H  . sodium chloride  3 mL Intravenous Q12H  . terazosin  5 mg Oral QHS   . heparin 1,200 Units/hr (08/31/13 0355)    Allergies:  Allergies  Allergen Reactions  . Bactrim [Sulfamethoxazole-Tmp Ds] Other (See Comments)    "does not do anything for me"    History   Social History  . Marital Status: Married    Spouse Name: N/A    Number of Children: N/A  . Years of Education: N/A   Occupational History  . Not on file.   Social History Main Topics  . Smoking status: Former Smoker    Quit date: 04/27/1993  . Smokeless tobacco: Not on file  . Alcohol Use: No  . Drug Use: No  . Sexual Activity: Yes   Other Topics Concern  . Not on file   Social History Narrative   Social History:   Married    Tobacco Use - No.    Alcohol Use - yes         Family History:   Mother died at age 56 of coronary disease.  Father died  at 49 of coronary disease.  He has 13 siblings, almost all of whom have coronary disease and some with premature onset.     Family History  Problem Relation Age of Onset  . Coronary artery disease Mother   . Heart disease Mother   . Coronary artery disease Father   . Heart disease Father   . Coronary artery disease      13 sibling, almost all have coronary disease and some with premature onset  . Heart disease      13 sibling, almost all have coronary disease and some with premature onset     Review of Systems: General: negative for chills, fever, night sweats or weight changes.  Cardiovascular: per HPI  Dermatological: negative for rash Respiratory: negative for cough or wheezing Urologic: negative for hematuria Abdominal: negative for nausea, vomiting, diarrhea, bright red blood per rectum, melena, or hematemesis Neurologic: negative for visual changes, syncope, or dizziness All other systems reviewed and are otherwise negative except as noted above.  Labs:  Recent Labs  08/31/13 0447  TROPONINI 1.42*   Lab Results  Component Value Date   WBC 6.6 08/31/2013   HGB 12.2* 08/31/2013   HCT 36.5* 08/31/2013   MCV 88.8 08/31/2013   PLT 207 08/31/2013    Recent Labs Lab 08/31/13 0447  NA 142  K 3.9  CL 102  CO2 28  BUN 7  CREATININE 0.85  CALCIUM 9.1  PROT 6.2  BILITOT 0.3  ALKPHOS 72  ALT 15  AST 19  GLUCOSE 123*   Lab Results  Component Value Date   CHOL 142 11/17/2012   HDL 46.80 11/17/2012   LDLCALC 77 11/17/2012   TRIG 90.0 11/17/2012   No results found for this basename: DDIMER    Radiology/Studies:  Dg Chest Portable 1 View  08/31/2013   CLINICAL DATA:  Chest pain  EXAM: PORTABLE CHEST - 1 VIEW  COMPARISON:  07/19/2013  FINDINGS: Chronic cardiopericardial enlargement.  CABG changes.  Pulmonary hyperinflation compatible with COPD. No edema, consolidation, effusion, or pneumothorax.  IMPRESSION: No active disease.   Electronically Signed   By: Jorje Guild M.D.   On: 08/31/2013 00:08    EKG: Afib, LAD, TWI in lateral leads unchanged in c/w EKG from 06/26/2013  Physical Exam: Blood pressure 129/68, pulse 93, temperature 97.9 F (36.6 C), temperature source Oral, resp. rate 18, height 6' (1.829 m), weight 106.6 kg (235 lb 0.2 oz), SpO2 94.00%. General: Well developed, well nourished, in no acute distress. Head: Normocephalic, atraumatic, sclera non-icteric, no xanthomas, nares are without discharge.  Neck: Negative for carotid bruits. JVD not elevated. Lungs: Clear bilaterally to auscultation without wheezes, rales, or rhonchi. Breathing is unlabored. Heart: irregular. 2/6 harsh mid peaking systolic murmur at the base. 2/6 holosystolic murmur at the apex Abdomen: Soft, non-tender, non-distended with normoactive bowel sounds. No hepatomegaly. No rebound/guarding. No obvious abdominal masses. Msk:  Strength and tone appear normal for age. Extremities: No clubbing or cyanosis. No edema.  Distal pedal pulses are 2+ and equal bilaterally. Neuro: Alert and oriented X 3. No facial asymmetry. No focal deficit. Moves all extremities spontaneously. Psych:  Responds to questions appropriately with a normal affect.     Assessment and Plan:  NST- ACS  Moderate AS, LVH  Afib    PLan  Cont Asp, statin , heparin and b-blocker  Hold pradaxa and cont heparin gtt in anticipation of possible LHC Monitor on tele, currently afib rate controlled and pt anticoagulated     Signed, Arnoldo Lenis, A M.D  08/31/2013, 6:06 AM

## 2013-08-31 NOTE — Progress Notes (Addendum)
SUBJECTIVE:  No chest pain. No further bowel surgery planned  OBJECTIVE:   Vitals:   Filed Vitals:   08/31/13 0600 08/31/13 0700 08/31/13 0740 08/31/13 0800  BP: 145/65 117/64  132/63  Pulse:      Temp:   98.8 F (37.1 C)   TempSrc:   Oral   Resp: 16 18  11   Height:      Weight:      SpO2: 96% 94%  97%   I&O's:   Intake/Output Summary (Last 24 hours) at 08/31/13 1046 Last data filed at 08/31/13 0800  Gross per 24 hour  Intake    109 ml  Output    600 ml  Net   -491 ml   TELEMETRY: Reviewed telemetry pt in AFib, rate controlled.:     PHYSICAL EXAM General: Well developed, well nourished, in no acute distress Head: Eyes PERRLA, No xanthomas.   Normal cephalic and atramatic  Lungs:   Clear bilaterally to auscultation and percussion. Heart:  Irregularly irregular S1 S2  No JVD.  2/6 systolic murmur. Abdomen: Bowel sounds are positive, abdomen soft and non-tender, dressing intact Msk:   Normal strength and tone for age. Extremities:  No edema.   Neuro: Alert and oriented X 3. Psych:  Normal affect, responds appropriately   LABS: Basic Metabolic Panel:  Recent Labs  08/30/13 2330 08/31/13 0447  NA 143 142  K 3.8 3.9  CL 101 102  CO2 25 28  GLUCOSE 162* 123*  BUN 8 7  CREATININE 0.90 0.85  CALCIUM 9.1 9.1   Liver Function Tests:  Recent Labs  08/30/13 2330 08/31/13 0447  AST 22 19  ALT 17 15  ALKPHOS 74 72  BILITOT 0.4 0.3  PROT 6.7 6.2  ALBUMIN 3.7 3.5   No results found for this basename: LIPASE, AMYLASE,  in the last 72 hours CBC:  Recent Labs  08/30/13 2330 08/31/13 0447  WBC 6.9 6.6  NEUTROABS  --  4.0  HGB 12.8* 12.2*  HCT 37.8* 36.5*  MCV 88.7 88.8  PLT 216 207   Cardiac Enzymes:  Recent Labs  08/31/13 0447 08/31/13 0845  TROPONINI 1.42* 1.71*   BNP: No components found with this basename: POCBNP,  D-Dimer: No results found for this basename: DDIMER,  in the last 72 hours Hemoglobin A1C: No results found for this  basename: HGBA1C,  in the last 72 hours Fasting Lipid Panel: No results found for this basename: CHOL, HDL, LDLCALC, TRIG, CHOLHDL, LDLDIRECT,  in the last 72 hours Thyroid Function Tests: No results found for this basename: TSH, T4TOTAL, FREET3, T3FREE, THYROIDAB,  in the last 72 hours Anemia Panel: No results found for this basename: VITAMINB12, FOLATE, FERRITIN, TIBC, IRON, RETICCTPCT,  in the last 72 hours Coag Panel:   Lab Results  Component Value Date   INR 1.15 08/30/2013   INR 1.20 12/23/2012   INR 1.1 09/28/2009   PROTIME 20.6 11/04/2008    RADIOLOGY: Dg Chest Portable 1 View  08/31/2013   CLINICAL DATA:  Chest pain  EXAM: PORTABLE CHEST - 1 VIEW  COMPARISON:  07/19/2013  FINDINGS: Chronic cardiopericardial enlargement.  CABG changes.  Pulmonary hyperinflation compatible with COPD. No edema, consolidation, effusion, or pneumothorax.  IMPRESSION: No active disease.   Electronically Signed   By: Jorje Guild M.D.   On: 08/31/2013 00:08      ASSESSMENT: NSTEMI, AFib  PLAN:  Pain free now.  Postpone cath until tomorrow since he had pradaxa last night.  Plan  for cath in the AM.  Questions about cath answered.  Rate controlled AFib.  If stent needed, he may need Plavix and Coumadin.  Regardless, given his age, he may need to have pradaxa switched.  Will leave this up to Dr. Stanford Breed.   Heparin gtt for now.   No further surgery planned so DES should be ok.   Aortic stenosis: moderate in the past.   Jettie Booze., MD  08/31/2013  10:46 AM

## 2013-09-01 ENCOUNTER — Encounter (HOSPITAL_COMMUNITY)
Admission: EM | Disposition: A | Discharge status: Home / Self Care | Payer: Self-pay | Source: Home / Self Care | Attending: Internal Medicine

## 2013-09-01 DIAGNOSIS — I4891 Unspecified atrial fibrillation: Secondary | ICD-10-CM | POA: Diagnosis not present

## 2013-09-01 DIAGNOSIS — I25810 Atherosclerosis of coronary artery bypass graft(s) without angina pectoris: Secondary | ICD-10-CM

## 2013-09-01 DIAGNOSIS — D649 Anemia, unspecified: Secondary | ICD-10-CM | POA: Diagnosis not present

## 2013-09-01 DIAGNOSIS — I359 Nonrheumatic aortic valve disorder, unspecified: Secondary | ICD-10-CM | POA: Diagnosis not present

## 2013-09-01 DIAGNOSIS — I219 Acute myocardial infarction, unspecified: Secondary | ICD-10-CM | POA: Diagnosis not present

## 2013-09-01 DIAGNOSIS — I214 Non-ST elevation (NSTEMI) myocardial infarction: Secondary | ICD-10-CM | POA: Diagnosis not present

## 2013-09-01 DIAGNOSIS — I701 Atherosclerosis of renal artery: Secondary | ICD-10-CM | POA: Diagnosis not present

## 2013-09-01 DIAGNOSIS — I200 Unstable angina: Secondary | ICD-10-CM | POA: Diagnosis not present

## 2013-09-01 DIAGNOSIS — I2582 Chronic total occlusion of coronary artery: Secondary | ICD-10-CM | POA: Diagnosis not present

## 2013-09-01 HISTORY — PX: LEFT HEART CATHETERIZATION WITH CORONARY/GRAFT ANGIOGRAM: SHX5450

## 2013-09-01 HISTORY — PX: PERCUTANEOUS CORONARY STENT INTERVENTION (PCI-S): SHX5485

## 2013-09-01 LAB — CBC
HCT: 37.2 % — ABNORMAL LOW (ref 39.0–52.0)
HEMATOCRIT: 34.8 % — AB (ref 39.0–52.0)
HEMOGLOBIN: 12.4 g/dL — AB (ref 13.0–17.0)
HEMOGLOBIN: 11.6 g/dL — AB (ref 13.0–17.0)
MCH: 29.9 pg (ref 26.0–34.0)
MCH: 30.1 pg (ref 26.0–34.0)
MCHC: 33.3 g/dL (ref 30.0–36.0)
MCHC: 33.3 g/dL (ref 30.0–36.0)
MCV: 89.6 fL (ref 78.0–100.0)
MCV: 90.4 fL (ref 78.0–100.0)
PLATELETS: 215 10*3/uL (ref 150–400)
Platelets: 213 10*3/uL (ref 150–400)
RBC: 4.15 MIL/uL — ABNORMAL LOW (ref 4.22–5.81)
RBC: 3.85 MIL/uL — ABNORMAL LOW (ref 4.22–5.81)
RDW: 14.7 % (ref 11.5–15.5)
RDW: 14.8 % (ref 11.5–15.5)
WBC: 7 10*3/uL (ref 4.0–10.5)
WBC: 6.5 10*3/uL (ref 4.0–10.5)

## 2013-09-01 LAB — GLUCOSE, CAPILLARY
GLUCOSE-CAPILLARY: 98 mg/dL (ref 70–99)
Glucose-Capillary: 100 mg/dL — ABNORMAL HIGH (ref 70–99)

## 2013-09-01 LAB — BASIC METABOLIC PANEL
BUN: 9 mg/dL (ref 6–23)
CALCIUM: 8.9 mg/dL (ref 8.4–10.5)
CO2: 28 meq/L (ref 19–32)
Chloride: 100 mEq/L (ref 96–112)
Creatinine, Ser: 0.85 mg/dL (ref 0.50–1.35)
GFR calc Af Amer: >90 mL/min (ref >90)
GFR calc non Af Amer: 81 mL/min — ABNORMAL LOW (ref >90)
GLUCOSE: 122 mg/dL — AB (ref 70–99)
Potassium: 3.8 mEq/L (ref 3.7–5.3)
Sodium: 140 mEq/L (ref 137–147)

## 2013-09-01 LAB — HEPARIN LEVEL (UNFRACTIONATED)
Heparin Unfractionated: 0.41 IU/mL (ref 0.30–0.70)
Heparin Unfractionated: 0.58 IU/mL (ref 0.30–0.70)

## 2013-09-01 LAB — POCT ACTIVATED CLOTTING TIME
ACTIVATED CLOTTING TIME: 404 s
ACTIVATED CLOTTING TIME: 199 s

## 2013-09-01 SURGERY — LEFT HEART CATHETERIZATION WITH CORONARY/GRAFT ANGIOGRAM
Anesthesia: LOCAL

## 2013-09-01 MED ORDER — FENTANYL CITRATE 0.05 MG/ML IJ SOLN
INTRAMUSCULAR | Status: AC
  Filled 2013-09-01: qty 2

## 2013-09-01 MED ORDER — LIDOCAINE HCL (PF) 1 % IJ SOLN
INTRAMUSCULAR | Status: AC
  Filled 2013-09-01: qty 30

## 2013-09-01 MED ORDER — BIVALIRUDIN 250 MG IV SOLR
INTRAVENOUS | Status: AC
  Filled 2013-09-01: qty 250

## 2013-09-01 MED ORDER — TIROFIBAN HCL IV 5 MG/100ML
0.1500 ug/kg/min | INTRAVENOUS | Status: AC
  Administered 2013-09-01: 0.15 ug/kg/min via INTRAVENOUS
  Administered 2013-09-01: 0.075 ug/kg/min via INTRAVENOUS
  Filled 2013-09-01 (×5): qty 100

## 2013-09-01 MED ORDER — TIROFIBAN (AGGRASTAT) BOLUS VIA INFUSION
25.0000 ug/kg | Freq: Once | INTRAVENOUS | Status: DC
  Filled 2013-09-01: qty 54

## 2013-09-01 MED ORDER — SODIUM CHLORIDE 0.9 % IV SOLN
1.7500 mg/kg/h | INTRAVENOUS | Status: DC
  Filled 2013-09-01: qty 250

## 2013-09-01 MED ORDER — NITROGLYCERIN 0.2 MG/ML ON CALL CATH LAB
INTRAVENOUS | Status: AC
  Filled 2013-09-01: qty 1

## 2013-09-01 MED ORDER — SODIUM CHLORIDE 0.9 % IV SOLN: INTRAVENOUS | Status: AC

## 2013-09-01 MED ORDER — SODIUM CHLORIDE 0.9 % IV SOLN: INTRAVENOUS | Status: DC

## 2013-09-01 MED ORDER — ASPIRIN 81 MG PO CHEW
81.0000 mg | CHEWABLE_TABLET | Freq: Every day | ORAL | Status: DC
  Administered 2013-09-02: 81 mg via ORAL
  Filled 2013-09-01: qty 1

## 2013-09-01 MED ORDER — TIROFIBAN HCL IV 5 MG/100ML
INTRAVENOUS | Status: AC
  Filled 2013-09-01: qty 100

## 2013-09-01 MED ORDER — MIDAZOLAM HCL 2 MG/2ML IJ SOLN
INTRAMUSCULAR | Status: AC
  Filled 2013-09-01: qty 2

## 2013-09-01 MED ORDER — HEPARIN (PORCINE) IN NACL 2-0.9 UNIT/ML-% IJ SOLN
INTRAMUSCULAR | Status: AC
  Filled 2013-09-01: qty 1000

## 2013-09-01 NOTE — Progress Notes (Signed)
Patient ID: [name]  male  [id number]    DOB: 1931/09/25    DOA: 08/30/2013  PCP: Nicoletta Dress, MD  Assessment/Plan: Principal Problem:   Unstable angina/ NSTEMI: History of coronary disease, CABG '94, low risk Myoview May 2014 - Patient seen and examined, no further chest pain - Troponins positive, cardiology consulted, currently on heparin drip - Planned cardiac cath today  Active Problems:   Essential hypertension, benign - atable    Chronic atrial fibrillation - Rate controlled, on heparin drip    Aortic stenosis- moderate  DVT Prophylaxis:  Code Status: Full code  Family Communication:  Disposition:  Consultants:  Cardiology  Procedures:  Cath today  Antibiotics:  None    Subjective: Patient seen and examined, states chest pain improved, no acute complaints  Objective: Weight change:   Intake/Output Summary (Last 24 hours) at 09/01/13 1210 Last data filed at 09/01/13 1041  Gross per 24 hour  Intake 1024.9 ml  Output   1160 ml  Net -135.1 ml   Blood pressure 142/43, pulse 78, temperature 97.8 F (36.6 C), temperature source Oral, resp. rate 16, height 6' (1.829 m), weight 106.6 kg (235 lb 0.2 oz), SpO2 98.00%.  Physical Exam: General: Alert and awake, oriented x3, not in any acute distress. CVS: S1-S2 clear, no murmur rubs or gallops Chest: clear to auscultation bilaterally, no wheezing, rales or rhonchi Abdomen: soft nontender, nondistended, normal bowel sounds, dressing intact Extremities: no cyanosis, clubbing or edema noted bilaterally Neuro: Cranial nerves II-XII intact, no focal neurological deficits  Lab Results: Basic Metabolic Panel:  Recent Labs Lab 08/31/13 0447 09/01/13 0420  NA 142 140  K 3.9 3.8  CL 102 100  CO2 28 28  GLUCOSE 123* 122*  BUN 7 9  CREATININE 0.85 0.85  CALCIUM 9.1 8.9   Liver Function Tests:  Recent Labs Lab 08/30/13 2330 08/31/13 0447  AST 22 19  ALT 17 15  ALKPHOS 74 72    BILITOT 0.4 0.3  PROT 6.7 6.2  ALBUMIN 3.7 3.5   No results found for this basename: LIPASE, AMYLASE,  in the last 168 hours No results found for this basename: AMMONIA,  in the last 168 hours CBC:  Recent Labs Lab 08/31/13 0447 09/01/13 0420  WBC 6.6 7.0  NEUTROABS 4.0  --   HGB 12.2* 12.4*  HCT 36.5* 37.2*  MCV 88.8 89.6  PLT 207 215   Cardiac Enzymes:  Recent Labs Lab 08/31/13 0447 08/31/13 0845 08/31/13 1441  TROPONINI 1.42* 1.71* 1.69*   BNP: No components found with this basename: POCBNP,  CBG:  Recent Labs Lab 08/31/13 0740 08/31/13 1133 08/31/13 1634 08/31/13 2128 09/01/13 0803  GLUCAP 114* 152* 123* 145* 100*     Micro Results: Recent Results (from the past 240 hour(s))  MRSA PCR SCREENING     Status: None   Collection Time    08/31/13  2:50 AM      Result Value Ref Range Status   MRSA by PCR NEGATIVE  NEGATIVE Final   Comment:            The GeneXpert MRSA Assay (FDA     approved for NASAL specimens     only), is one component of a     comprehensive MRSA colonization     surveillance program. It is not     intended to diagnose MRSA     infection nor to guide or     monitor treatment for  MRSA infections.    Studies/Results: Dg Chest Portable 1 View  08/31/2013   CLINICAL DATA:  Chest pain  EXAM: PORTABLE CHEST - 1 VIEW  COMPARISON:  07/19/2013  FINDINGS: Chronic cardiopericardial enlargement.  CABG changes.  Pulmonary hyperinflation compatible with COPD. No edema, consolidation, effusion, or pneumothorax.  IMPRESSION: No active disease.   Electronically Signed   By: Jorje Guild M.D.   On: 08/31/2013 00:08    Medications: Scheduled Meds: . acyclovir  400 mg Oral BID  . allopurinol  300 mg Oral Daily  . aspirin EC  325 mg Oral Daily  . atorvastatin  40 mg Oral QHS  . azelastine  2 spray Each Nare Daily  . benazepril  10 mg Oral Daily  . buPROPion  150 mg Oral Daily  . cholecalciferol  2,000 Units Oral Daily  . ferrous  sulfate  325 mg Oral Q breakfast  . furosemide  20 mg Oral Daily  . insulin aspart  0-9 Units Subcutaneous TID WC  . methocarbamol  750 mg Oral BID  . metoprolol  50 mg Oral BID  . multivitamin with minerals  1 tablet Oral Daily  . multivitamin-lutein  1 capsule Oral Daily  . pantoprazole  40 mg Oral Daily  . sodium chloride  3 mL Intravenous Q12H  . sodium chloride  3 mL Intravenous Q12H  . sodium chloride  3 mL Intravenous Q12H  . terazosin  5 mg Oral QHS      LOS: 2 days   Jalaina Salyers M.D. [facility] 09/01/2013, 12:10 PM Pager: [phone]  If 7PM-7AM, please contact night-coverage [email or web address] Password [email or web address]

## 2013-09-01 NOTE — Progress Notes (Signed)
Back from the cath lab awake and alert, sheath in the right groin intact, instructed to be on bed rest and not to bend right knee. Pulses are by doppler.

## 2013-09-01 NOTE — Progress Notes (Signed)
To the cath lab by bed, stable.

## 2013-09-01 NOTE — CV Procedure (Signed)
Left Heart Catheterization with Coronary Angiography Bypass Angiography and PCI  Report  [name]  80 y.o.  male 01-Dec-1931  Procedure Date: 09/01/2013 Referring Physician: Irish Lack, MD Primary Cardiologist:: HWBSmith, III, MD  INDICATIONS: Acute coronary syndrome  PROCEDURE: 1. Left heart catheterization; 2. Coronary angiography; 3. Left ventriculography; 4. Bypass graft angiography; 5. Left internal mammary graft angiography; 6. PTCA distal LAD via the left internal mammary artery  CONSENT:  The risks, benefits, and details of the procedure were explained in detail to the patient. Risks including death, stroke, heart attack, kidney injury, allergy, limb ischemia, bleeding and radiation injury were discussed.  The patient verbalized understanding and wanted to proceed.  Informed written consent was obtained.  PROCEDURE TECHNIQUE:  After Xylocaine anesthesia a 5 French sheath was placed in the right femoral artery with a single anterior needle wall stick.  Coronary angiography was done using a 5 [place] A2 multipurpose and 5 [place] internal mammary catheter.  Left ventriculography was done using the 5 French A2 multipurpose catheter and hand injection.    After reviewing the digital images we felt that a distal lesion in the LAD beyond the insertion of the internal mammary graft was the source of the patient's ACS. Left intramammary angiography demonstrated a 99% stenosis with TIMI grade 2-3 flow at the apex. Regional wall motion abnormality was noted and in this area.  The saphenous vein sequential graft to obtuse marginal 2 and 3 was widely patent and the LIMA to the LAD is also widely patent. The native right coronary is totally occluded with collaterals from the left. The native circumflex is patent with the exception of occlusion noted in the obtuse marginals have been grafted.  After some discussion, we proceeded with PCI using a 6 French internal mammary guide catheter. A  bivalirudin bolus and infusion was started achieving an ACT greater than 300. He was not loaded with antiplatelet therapy but did receive aspirin this morning. After obtaining a guiding shot we will able to advance an [place] distally, via the internal mammary. We predilated with a 2.25 x 15 mm balloon. Multiple inflations were performed. A nice angiographic result was obtained. TIMI grade 3 flow was achieved. I decided against stenting, as this will complicate the patient's medical regimen and angiographic result was quite acceptable.  Post procedure the bivalirudin was discontinued. A bolus followed by an infusion of Aggrastat was started.  The sheath was sewn in place.  MEDICATIONS: 2 mg of IV Versed; 100 mcg of fentanyl  CONTRAST:  Total of 250 cc.  COMPLICATIONS:  None   HEMODYNAMICS:  Aortic pressure 112/45 mmHg; LV pressure 130/10 mmHg; LVEDP 16 mm mercury  ANGIOGRAPHIC DATA:   The left main coronary artery is widely patent.  The left anterior descending artery is  Patent and in the mid vessel appears to be totally occluded after the large first diagonal. The proximal LAD contains irregularities with up to 50-60% stenosis.  The left circumflex artery is essentially totally occluded in the mid vessel 2 small obtuse marginal branches arise from the proximal circumflex. The distal vessel is supplied by a patent saphenous vein graft.  The right coronary artery is is totally occluded. The distal vessel fills faintly by left to right collaterals. The right coronary appears to be nondominant.   BYPASS GRAFT ANGIOGRAPHY: The saphenous vein graft sequential to the second and third obtuse marginal is widely patent  The left internal mammary graft to the mid LAD is widely patent  PCI RESULTS: PTCA of the distal LAD via the left internal mammary with a 2.25 x 15 balloon resulted in a less than 40% stenosis and TIMI grade 3 flow. No dissection or complication occurred. We chose not to  stent to to the distal location, the small vessel size, and complicating issues associated with triple drug (anticoagulation plus dual antiplatelet therapy)  LEFT VENTRICULOGRAM:  Left ventricular angiogram was done in the 30 RAO projection and revealed inferoapical severe hypokinesis. EF 45%   IMPRESSIONS:  1. Acute coronary syndrome due to high-grade stenosis in the apical LAD. 2. Successful angioplasty of the apical LAD via the internal mammary artery. Stenting was not performed as explained above. 3. Patent saphenous vein sequential graft to obtuse marginal 2 and 3. 4. Patent left internal mammary graft to the LAD 5. Total occlusion of the native right coronary. The right coronary appears to be a nondominant vessel 6. Total occlusion of the native circumflex in the midsegment 7. Inferoapical wall motion abnormality compatible with the high-grade stenosis noted in the apical LAD. EF is 40-45%   RECOMMENDATION:  Aggrastat therapy x12 hours In a.m. resume aspirin 81 mg daily and Pradaxa Patient is eligible for discharge in a.m. if no complications.[name]

## 2013-09-01 NOTE — Progress Notes (Signed)
Act -199.continue to monitor.

## 2013-09-01 NOTE — Interval H&P Note (Signed)
History and Physical Interval Note:  09/01/2013 11:15 AM Met with the patient and family. No changes from prior note. Procedure and risk of MI, death, CVA, kidney injury, blood flow decrease in leg among others including bleeding discussed and accepted. Sinclair Grooms

## 2013-09-01 NOTE — Progress Notes (Signed)
ANTICOAGULATION CONSULT NOTE - Follow up Consult  Pharmacy Re:  Tirofiban Follow Up Indication: chest pain/ACS, afib  Allergies  Allergen Reactions  . Bactrim [Sulfamethoxazole-Tmp Ds] Other (See Comments)    "does not do anything for me"   Patient Measurements: Height: 6' (182.9 cm) Weight: 235 lb 0.2 oz (106.6 kg) IBW/kg (Calculated) : 77.6 Heparin Dosing Weight: 87.3  Vital Signs: Temp: 97.7 F (36.5 C) (03/31 2000) Temp src: Oral (03/31 2000) BP: 122/48 mmHg (03/31 2100) Pulse Rate: 68 (03/31 1635)  Labs:  Recent Labs  08/30/13 2330 08/31/13 0447 08/31/13 0845 08/31/13 1440 08/31/13 1441 09/01/13 0025 09/01/13 0420 09/01/13 0840 09/01/13 2034  HGB 12.8* 12.2*  --   --   --   --  12.4*  --  11.6*  HCT 37.8* 36.5*  --   --   --   --  37.2*  --  34.8*  PLT 216 207  --   --   --   --  215  --  213  APTT 45*  --   --   --   --   --   --   --   --   LABPROT 14.5  --   --   --   --   --   --   --   --   INR 1.15  --   --   --   --   --   --   --   --   HEPARINUNFRC  --   --   --  0.14*  --  0.41  --  0.58  --   CREATININE 0.90 0.85  --   --   --   --  0.85  --   --   TROPONINI  --  1.42* 1.71*  --  1.69*  --   --   --   --    Assessment: 12 yom who is s/p cardiac cath with findings as noted.  He was started on IV Tirofiban with plans to continue for 12 hours.  A CBC was drawn ~ 8PM and his H/H and platelets are stable.  He is without noted bleeding complications and plans for discharge in the morning if stable.  Goal of Therapy:  Therapeutic response to IV Tirofiban   Plan:  No change to current regimen needed Monitor for bleeding F/U am labs as ordered.   Rober Minion, PharmD., MS Clinical Pharmacist Pager:  [phone] Thank you for allowing pharmacy to be part of this patients care team.

## 2013-09-01 NOTE — Progress Notes (Signed)
6 french sheath removed from right groin and pressure held x 25 minutes.  Vitals stable throughout sheath removal.  Pedal pulses by doppler.  Pressure dressing applied to site.  Site level 0 with no hematoma.  Instructions given for bedrest and RN assessed site.

## 2013-09-02 DIAGNOSIS — I2582 Chronic total occlusion of coronary artery: Secondary | ICD-10-CM | POA: Diagnosis not present

## 2013-09-02 DIAGNOSIS — I4891 Unspecified atrial fibrillation: Secondary | ICD-10-CM | POA: Diagnosis not present

## 2013-09-02 DIAGNOSIS — I200 Unstable angina: Secondary | ICD-10-CM | POA: Diagnosis not present

## 2013-09-02 DIAGNOSIS — I2510 Atherosclerotic heart disease of native coronary artery without angina pectoris: Secondary | ICD-10-CM | POA: Diagnosis not present

## 2013-09-02 DIAGNOSIS — G4733 Obstructive sleep apnea (adult) (pediatric): Secondary | ICD-10-CM

## 2013-09-02 DIAGNOSIS — I214 Non-ST elevation (NSTEMI) myocardial infarction: Secondary | ICD-10-CM

## 2013-09-02 DIAGNOSIS — I10 Essential (primary) hypertension: Secondary | ICD-10-CM | POA: Diagnosis not present

## 2013-09-02 DIAGNOSIS — I359 Nonrheumatic aortic valve disorder, unspecified: Secondary | ICD-10-CM | POA: Diagnosis not present

## 2013-09-02 DIAGNOSIS — I701 Atherosclerosis of renal artery: Secondary | ICD-10-CM | POA: Diagnosis not present

## 2013-09-02 DIAGNOSIS — I219 Acute myocardial infarction, unspecified: Secondary | ICD-10-CM | POA: Diagnosis not present

## 2013-09-02 LAB — CBC
HCT: 35.6 % — ABNORMAL LOW (ref 39.0–52.0)
Hemoglobin: 11.8 g/dL — ABNORMAL LOW (ref 13.0–17.0)
MCH: 29.9 pg (ref 26.0–34.0)
MCHC: 33.1 g/dL (ref 30.0–36.0)
MCV: 90.1 fL (ref 78.0–100.0)
Platelets: 220 10*3/uL (ref 150–400)
RBC: 3.95 MIL/uL — ABNORMAL LOW (ref 4.22–5.81)
RDW: 14.6 % (ref 11.5–15.5)
WBC: 6 10*3/uL (ref 4.0–10.5)

## 2013-09-02 LAB — BASIC METABOLIC PANEL
BUN: 11 mg/dL (ref 6–23)
CO2: 26 mEq/L (ref 19–32)
Calcium: 8.6 mg/dL (ref 8.4–10.5)
Chloride: 105 mEq/L (ref 96–112)
Creatinine, Ser: 0.84 mg/dL (ref 0.50–1.35)
GFR, EST NON AFRICAN AMERICAN: 81 mL/min — AB (ref >90)
Glucose, Bld: 100 mg/dL — ABNORMAL HIGH (ref 70–99)
Potassium: 4.2 mEq/L (ref 3.7–5.3)
SODIUM: 144 meq/L (ref 137–147)

## 2013-09-02 LAB — GLUCOSE, CAPILLARY
Glucose-Capillary: 152 mg/dL — ABNORMAL HIGH (ref 70–99)
Glucose-Capillary: 108 mg/dL — ABNORMAL HIGH (ref 70–99)
Glucose-Capillary: 124 mg/dL — ABNORMAL HIGH (ref 70–99)

## 2013-09-02 MED ORDER — ASPIRIN EC 81 MG PO TBEC: 81.0000 mg | DELAYED_RELEASE_TABLET | Freq: Every day | ORAL | Status: DC

## 2013-09-02 MED ORDER — DABIGATRAN ETEXILATE MESYLATE 150 MG PO CAPS
150.0000 mg | ORAL_CAPSULE | Freq: Two times a day (BID) | ORAL | Status: DC
  Administered 2013-09-02: 150 mg via ORAL
  Filled 2013-09-02 (×2): qty 1

## 2013-09-02 NOTE — Progress Notes (Signed)
SUBJECTIVE:  Pt doing well this AM, denies CP, doing well post cath   OBJECTIVE:   Vitals:   Filed Vitals:   09/02/13 0000 09/02/13 0200 09/02/13 0400 09/02/13 0804  BP: 89/41 107/48 122/48 105/38  Pulse:      Temp: 97.4 F (36.3 C)   97.8 F (36.6 C)  TempSrc: Oral   Oral  Resp: 17 17 17 20   Height:      Weight:      SpO2: 98% 98% 99% 99%   I&O's:    Intake/Output Summary (Last 24 hours) at 09/02/13 1108 Last data filed at 09/02/13 0900  Gross per 24 hour  Intake 1157.1 ml  Output    775 ml  Net  382.1 ml   TELEMETRY: Reviewed telemetry pt in AFib, rate controlled.  PHYSICAL EXAM General: Well developed, well nourished, in no acute distress Head: Eyes PERRLA, No xanthomas.   Normal cephalic and atramatic  Lungs:   Clear bilaterally to auscultation and percussion. Heart:  Irregularly irregular S1 S2  No JVD.  2/6 systolic murmur. Abdomen: Bowel sounds are positive, abdomen soft and non-tender, dressing intact Msk:   Normal strength and tone for age. Extremities:  No edema, Cath site at R groin c/d/i w/o erythema   Neuro: Alert and oriented X 3. Psych:  Normal affect, responds appropriately   LABS: Basic Metabolic Panel:  Recent Labs  09/01/13 0420 09/02/13 0224  NA 140 144  K 3.8 4.2  CL 100 105  CO2 28 26  GLUCOSE 122* 100*  BUN 9 11  CREATININE 0.85 0.84  CALCIUM 8.9 8.6   Liver Function Tests:  Recent Labs  08/30/13 2330 08/31/13 0447  AST 22 19  ALT 17 15  ALKPHOS 74 72  BILITOT 0.4 0.3  PROT 6.7 6.2  ALBUMIN 3.7 3.5   No results found for this basename: LIPASE, AMYLASE,  in the last 72 hours CBC:  Recent Labs  08/31/13 0447  09/01/13 2034 09/02/13 0224  WBC 6.6  < > 6.5 6.0  NEUTROABS 4.0  --   --   --   HGB 12.2*  < > 11.6* 11.8*  HCT 36.5*  < > 34.8* 35.6*  MCV 88.8  < > 90.4 90.1  PLT 207  < > 213 220  < > = values in this interval not displayed. Cardiac Enzymes:  Recent Labs  08/31/13 0447 08/31/13 0845 08/31/13 1441   TROPONINI 1.42* 1.71* 1.69*   BNP: No components found with this basename: POCBNP,  D-Dimer: No results found for this basename: DDIMER,  in the last 72 hours Hemoglobin A1C: No results found for this basename: HGBA1C,  in the last 72 hours Fasting Lipid Panel: No results found for this basename: CHOL, HDL, LDLCALC, TRIG, CHOLHDL, LDLDIRECT,  in the last 72 hours Thyroid Function Tests:  Recent Labs  08/31/13 0447  TSH 2.567   Anemia Panel: No results found for this basename: VITAMINB12, FOLATE, FERRITIN, TIBC, IRON, RETICCTPCT,  in the last 72 hours Coag Panel:   Lab Results  Component Value Date   INR 1.15 08/30/2013   INR 1.20 12/23/2012   INR 1.1 09/28/2009   PROTIME 20.6 11/04/2008    RADIOLOGY: Dg Chest Portable 1 View  08/31/2013   CLINICAL DATA:  Chest pain  EXAM: PORTABLE CHEST - 1 VIEW  COMPARISON:  07/19/2013  FINDINGS: Chronic cardiopericardial enlargement.  CABG changes.  Pulmonary hyperinflation compatible with COPD. No edema, consolidation, effusion, or pneumothorax.  IMPRESSION: No active disease.  Electronically Signed   By: Jorje Guild M.D.   On: 08/31/2013 00:08      ASSESSMENT:  NSTEMI s/p PCI day #1 Atrial Fibrillation rate controlled Aortic Stenosis  HTN  PLAN:   Doing well s/p day 1 PCI w/o stent placement due to medical co-morbidities.   Continue with Pradaxa and ASA, Hgb and creatinine stable from AM labs. Plan for d/c today.  Kennith Maes, DO  09/02/2013  11:08 AM  I have examined the patient and reviewed assessment and plan and discussed with patient.  Agree with above as stated.  No stent placed.  PTCA only.  Will send home on 81 mg ASA with regular dose of Pradaxa.  Future aspirin to be determined when he follows up with Dr. Stanford Breed.  No need for Plavix since no stent placed.   D/C today.  Maliq Pilley S.

## 2013-09-02 NOTE — Progress Notes (Signed)
CARDIAC REHAB PHASE I   PRE:  Rate/Rhythm: 80 SR  BP:  Supine:   Sitting: 106/50  Standing:    SaO2:   MODE:  Ambulation: 350 ft   POST:  Rate/Rhythm: 96 SR  BP:  Supine:   Sitting: 121/54  Standing:    SaO2:  1015-1102 Pt walked 350 ft on RA with hand held asst. Gait fairly steady. No CP. Tolerated well. Education completed with pt who voiced understanding. Discussed CRP 2. Pt declined due to recent bowel surgery and wants to exercise on his own at home. Instructed not to use stationary bike until groin healed.    Graylon Good, RN BSN  09/02/2013 10:57 AM

## 2013-09-02 NOTE — Discharge Summary (Signed)
Physician Discharge Summary  ALICK FOLKS [id number] DOB: 1932/05/26 DOA: 08/30/2013  PCP: Nicoletta Dress, MD  Admit date: 08/30/2013 Discharge date: 09/02/2013  Time spent: Less than 30 minutes  Recommendations for Outpatient Follow-up:  1. Dr. Nelda Bucks, PCP in 1 week. 2. Dr. Kirk Ruths, Cardiology in 2 weeks. MDs office will call patient with followup appointment. 3. Surgery: Patient has an outpatient followup appointment on 09/15/13.  Discharge Diagnoses:  Principal Problem:   Unstable angina Active Problems:   Essential hypertension, benign   CAD - CABG '94, low risk Myoview May 2014   Chronic atrial fibrillation   Acute myocardial infarction, unspecified site, initial episode of care   Aortic stenosis   Atrial fibrillation   Discharge Condition: Improved & Stable  Diet recommendation: Heart healthy diet.  Filed Weights   08/31/13 0250 08/31/13 0304  Weight: 106.6 kg (235 lb 0.2 oz) 106.6 kg (235 lb 0.2 oz)    History of present illness and hospital course:  80 year old male with history of CAD, CABG, hypertension, peripheral vascular disease, chronic A. fib on Pradaxa, hyperlipidemia, aortic stenosis, COPD, OSA on nightly oxygen 2 L per minute, type II DM-diet controlled, GERD, recent abdominal surgery, and was admitted on 08/31/13 with complaints of chest pain concerning for unstable angina. He had a stress test last year which was apparently negative for ischemia. Initial EKG was unremarkable. He however ruled in for NSTEMI with peak troponin of 1.7. He was treated with nitroglycerin, aspirin, statins, beta blockers, Pradaxa was temporarily held and started on IV heparin infusion in anticipation of cardiac cath. Cardiology was consulted and patient underwent successful angioplasty of the apical LAD but without stent placement due to medical comorbidities. Cardiology has seen him today (discussed with rounding cardiologist) who have cleared him for discharge  home on aspirin and Pradaxa. Followup with primary cardiologist in 2-3 weeks. According to cardiologist, future aspirin to be determined when he follows up with Dr. Stanford Breed and no need for Plavix since no stent placed. Chest pain resolved.  Anemia: May be chronic. Stable. Outpatient followup. Hypertension: Controlled. Continue home dose of benazepril and metoprolol. Chronic A. fib: Controlled ventricular rate. Continue metoprolol and Pradaxa COPD/OSA: Stable. Continue nightly oxygen. Diet controlled type II DM: Reasonable inpatient control on SSI. No medications discharge. Recent abdominal surgery: Patient has followup appointment with primary surgeon on 4/14   Consultations:  Cardiology  Procedures:   Left heart catheterization with coronary angiography bypass angiography and PCI 09/01/13 IMPRESSIONS: 1. Acute coronary syndrome due to high-grade stenosis in the apical LAD.  2. Successful angioplasty of the apical LAD via the internal mammary artery. Stenting was not performed as explained above.  3. Patent saphenous vein sequential graft to obtuse marginal 2 and 3.  4. Patent left internal mammary graft to the LAD  5. Total occlusion of the native right coronary. The right coronary appears to be a nondominant vessel  6. Total occlusion of the native circumflex in the midsegment  7. Inferoapical wall motion abnormality compatible with the high-grade stenosis noted in the apical LAD. EF is 40-45%  RECOMMENDATION: Aggrastat therapy x12 hours  In a.m. resume aspirin 81 mg daily and Pradaxa  Patient is eligible for discharge in a.m. if no complications     Discharge Exam:  Complaints:  Patient denies chest pain. Denies any other complaints and is anxious to go home.  Filed Vitals:   09/02/13 0200 09/02/13 0400 09/02/13 0804 09/02/13 1149  BP: 107/48 122/48 105/38 104/47  Pulse:  74 76  Temp:   97.8 F (36.6 C) 97.9 F (36.6 C)  TempSrc:   Oral Axillary  Resp: 17 17 20 12    Height:      Weight:      SpO2: 98% 99% 99% 98%    General exam: Pleasant elderly male lying comfortably in bed. Respiratory system: Clear. No increased work of breathing. Cardiovascular system: S1 & S2 heard, RRR. No JVD, murmurs, gallops, clicks or pedal edema. Telemetry: A. fib with controlled ventricular rate. Gastrointestinal system: Abdomen is nondistended, soft and nontender. Normal bowel sounds heard. A midline laparotomy scar has healed well except in the inferior pole with his tiny area of open wound which is not draining and no acute signs. Central nervous system: Alert and oriented. No focal neurological deficits. Extremities: Symmetric 5 x 5 power.  Discharge Instructions       Future Appointments Provider Department Dept Phone   09/22/2013 10:30 AM [facility] [place] [facility] [address] [id number]   09/22/2013 11:30 AM Rosetta Posner, MD [facility] [phone]       Medication List         acetaminophen 500 MG tablet  Commonly known as:  TYLENOL  Take 500 mg by mouth every 6 (six) hours as needed for pain.     acyclovir 800 MG tablet  Commonly known as:  ZOVIRAX  Take 400 mg by mouth 2 (two) times daily.     albuterol 90 MCG/ACT inhaler  Commonly known as:  PROVENTIL,VENTOLIN  Inhale 2 puffs into the lungs every 4 (four) hours as needed for shortness of breath.     allopurinol 300 MG tablet  Commonly known as:  ZYLOPRIM  Take 300 mg by mouth daily.     aspirin EC 81 MG tablet  Take 1 tablet (81 mg total) by mouth daily.     atorvastatin 40 MG tablet  Commonly known as:  LIPITOR  Take 40 mg by mouth at bedtime.     azelastine 137 MCG/SPRAY nasal spray  Commonly known as:  ASTELIN  Place 2 sprays into both nostrils daily.     benazepril 20 MG tablet  Commonly known as:  LOTENSIN  Take 10 mg by mouth daily.     budesonide-formoterol 80-4.5 MCG/ACT inhaler  Commonly known as:  SYMBICORT  Inhale 2 puffs  into the lungs 2 (two) times daily as needed (bronchitis).     buPROPion 150 MG 24 hr tablet  Commonly known as:  WELLBUTRIN XL  Take 150 mg by mouth daily.     DUONEB 0.5-2.5 (3) MG/3ML Soln  Generic drug:  ipratropium-albuterol  Take 3 mLs by nebulization 3 (three) times daily as needed (for bronchitis or other virus that affects breathing).     ferrous sulfate 325 (65 FE) MG tablet  Take 325 mg by mouth daily with breakfast.     furosemide 20 MG tablet  Commonly known as:  LASIX  Take 20 mg by mouth daily.     lansoprazole 30 MG capsule  Commonly known as:  PREVACID  Take 30 mg by mouth daily.     methocarbamol 750 MG tablet  Commonly known as:  ROBAXIN  Take 750 mg by mouth 2 (two) times daily.     metoprolol 50 MG tablet  Commonly known as:  LOPRESSOR  Take 1 tablet (50 mg total) by mouth 2 (two) times daily.     multivitamin with minerals Tabs tablet  Take 1 tablet  by mouth daily.     multivitamin-lutein Caps capsule  Take 1 capsule by mouth daily.     PRADAXA 150 MG Caps capsule  Generic drug:  dabigatran  Take 150 mg by mouth 2 (two) times daily.     terazosin 5 MG capsule  Commonly known as:  HYTRIN  Take 5 mg by mouth at bedtime.     trolamine salicylate 10 % cream  Commonly known as:  ASPERCREME  Apply 1 application topically 4 (four) times daily as needed (pain).     Vitamin D 2000 UNITS Caps  Take 2,000 Units by mouth daily.       Follow-up Information   Follow up with SCHULTZ,DOUGLAS E, MD. Schedule an appointment as soon as possible for a visit in 1 week.   Specialty:  Internal Medicine   Contact information:   [place] [address] [phone]       Follow up with Kirk Ruths, MD. Schedule an appointment as soon as possible for a visit in 2 weeks.   Specialty:  Cardiology   Contact information:   [id number] [place] [address] [phone]        The results of significant  diagnostics from this hospitalization (including imaging, microbiology, ancillary and laboratory) are listed below for reference.    Significant Diagnostic Studies: Dg Chest Portable 1 View  08/31/2013   CLINICAL DATA:  Chest pain  EXAM: PORTABLE CHEST - 1 VIEW  COMPARISON:  07/19/2013  FINDINGS: Chronic cardiopericardial enlargement.  CABG changes.  Pulmonary hyperinflation compatible with COPD. No edema, consolidation, effusion, or pneumothorax.  IMPRESSION: No active disease.   Electronically Signed   By: Jorje Guild M.D.   On: 08/31/2013 00:08    Microbiology: Recent Results (from the past 240 hour(s))  MRSA PCR SCREENING     Status: None   Collection Time    08/31/13  2:50 AM      Result Value Ref Range Status   MRSA by PCR NEGATIVE  NEGATIVE Final   Comment:            The GeneXpert MRSA Assay (FDA     approved for NASAL specimens     only), is one component of a     comprehensive MRSA colonization     surveillance program. It is not     intended to diagnose MRSA     infection nor to guide or     monitor treatment for     MRSA infections.     Labs: Basic Metabolic Panel:  Recent Labs Lab 08/30/13 2330 08/31/13 0447 09/01/13 0420 09/02/13 0224  NA 143 142 140 144  K 3.8 3.9 3.8 4.2  CL 101 102 100 105  CO2 25 28 28 26   GLUCOSE 162* 123* 122* 100*  BUN 8 7 9 11   CREATININE 0.90 0.85 0.85 0.84  CALCIUM 9.1 9.1 8.9 8.6   Liver Function Tests:  Recent Labs Lab 08/30/13 2330 08/31/13 0447  AST 22 19  ALT 17 15  ALKPHOS 74 72  BILITOT 0.4 0.3  PROT 6.7 6.2  ALBUMIN 3.7 3.5   No results found for this basename: LIPASE, AMYLASE,  in the last 168 hours No results found for this basename: AMMONIA,  in the last 168 hours CBC:  Recent Labs Lab 08/30/13 2330 08/31/13 0447 09/01/13 0420 09/01/13 2034 09/02/13 0224  WBC 6.9 6.6 7.0 6.5 6.0  NEUTROABS  --  4.0  --   --   --  HGB 12.8* 12.2* 12.4* 11.6* 11.8*  HCT 37.8* 36.5* 37.2* 34.8* 35.6*  MCV  88.7 88.8 89.6 90.4 90.1  PLT 216 207 215 213 220   Cardiac Enzymes:  Recent Labs Lab 08/31/13 0447 08/31/13 0845 08/31/13 1441  TROPONINI 1.42* 1.71* 1.69*   BNP: BNP (last 3 results) No results found for this basename: PROBNP,  in the last 8760 hours CBG:  Recent Labs Lab 09/01/13 0803 09/01/13 1639 09/01/13 2135 09/02/13 0807 09/02/13 1152  GLUCAP 100* 98 152* 108* 124*    Additional labs: 1. TSH: 2.567   Signed:  Vernell Leep, MD, FACP, FHM. [facility] Pager [phone]  If 7PM-7AM, please contact night-coverage [email or web address] Password [email or web address] 09/02/2013, 1:33 PM

## 2013-09-02 NOTE — Progress Notes (Signed)
Discharged home accompanied by wife, discharge instructions and prescription given to pt. Belongings taken home.

## 2013-09-09 DIAGNOSIS — I4891 Unspecified atrial fibrillation: Secondary | ICD-10-CM | POA: Diagnosis not present

## 2013-09-09 DIAGNOSIS — I200 Unstable angina: Secondary | ICD-10-CM | POA: Diagnosis not present

## 2013-09-09 DIAGNOSIS — Z6832 Body mass index (BMI) 32.0-32.9, adult: Secondary | ICD-10-CM | POA: Diagnosis not present

## 2013-09-09 DIAGNOSIS — I214 Non-ST elevation (NSTEMI) myocardial infarction: Secondary | ICD-10-CM | POA: Diagnosis not present

## 2013-09-17 ENCOUNTER — Ambulatory Visit (INDEPENDENT_AMBULATORY_CARE_PROVIDER_SITE_OTHER): Payer: Medicare Other | Admitting: Physician Assistant

## 2013-09-17 ENCOUNTER — Encounter: Payer: Self-pay | Admitting: Physician Assistant

## 2013-09-17 VITALS — BP 128/58 | HR 72 | Ht 72.0 in | Wt 240.0 lb

## 2013-09-17 DIAGNOSIS — I2510 Atherosclerotic heart disease of native coronary artery without angina pectoris: Secondary | ICD-10-CM

## 2013-09-17 DIAGNOSIS — E785 Hyperlipidemia, unspecified: Secondary | ICD-10-CM

## 2013-09-17 DIAGNOSIS — I359 Nonrheumatic aortic valve disorder, unspecified: Secondary | ICD-10-CM

## 2013-09-17 DIAGNOSIS — I701 Atherosclerosis of renal artery: Secondary | ICD-10-CM

## 2013-09-17 DIAGNOSIS — I252 Old myocardial infarction: Secondary | ICD-10-CM

## 2013-09-17 DIAGNOSIS — I219 Acute myocardial infarction, unspecified: Secondary | ICD-10-CM

## 2013-09-17 DIAGNOSIS — I4891 Unspecified atrial fibrillation: Secondary | ICD-10-CM | POA: Diagnosis not present

## 2013-09-17 DIAGNOSIS — I6529 Occlusion and stenosis of unspecified carotid artery: Secondary | ICD-10-CM | POA: Diagnosis not present

## 2013-09-17 DIAGNOSIS — I10 Essential (primary) hypertension: Secondary | ICD-10-CM

## 2013-09-17 NOTE — Progress Notes (Signed)
[place], [address] [place], [place]  [address] Phone: [phone] Fax:  [phone]  Date:  09/17/2013   ID:  [name], DOB 1931/07/14, MRN [id number]  PCP:  Nicoletta Dress, MD  Cardiologist:  Dr. Kirk Ruths     History of Present Illness: [name] is a 80 y.o. male with a hx of CAD s/p CABG, permanent AFib, PAD (renal artery stenosis and cerebrovascular disease), aortic stenosis, benign thyroid nodule.  He is s/p R CEA in 01/2013.  He was recently admitted to [facility] with a small bowel obstruction. He underwent partial resection. He was then admitted 3/29-4/1 to [facility] with a non-STEMI.  Echocardiogram done prior to admission demonstrated moderate aortic stenosis. Gradient was somewhat worse than previous. Cardiac catheterization demonstrated patent bypass grafts. He did have distal LAD stenosis beyond the graft insertion and it was felt to be the cause of his ACS. This was treated with balloon angioplasty only. He was continued on Pradaxa and aspirin.  He has been doing well since discharge. He has been walking without significant dyspnea. He denies chest pain. He denies orthopnea, PND. He sleeps with oxygen at night. He denies any edema. He denies syncope.   Studies:  - LHC (09/01/13):  Mid LAD occluded, distal LAD 99% beyond the graft, mid circumflex occluded, RCA occluded, SVG-OM2/OM3 patent, LIMA-LAD patent. EF 45% with inferoapical HK.  PCI: POBA to the distal LAD via the LIMA  - Echo (08/19/13):  Mod LVH, EF 55-60%, no RWMA, mod AS (mean 32 mmHg), mild AI, MAC, mod to severe LAE, mod RAE  - Nuclear (11/2012):  No scar or ischemia, EF 50%; Low Risk  - Renal Art [place] (11/2011):  Bilateral 1-59% RAS   Recent Labs: 11/17/2012: HDL Cholesterol 46.80; LDL (calc) 77  08/31/2013: ALT 15; TSH 2.567  09/02/2013: Creatinine 0.84; Hemoglobin 11.8*; Potassium 4.2   Wt Readings from Last 3 Encounters:  09/17/13 240 lb (108.863 kg)  08/31/13 235 lb  0.2 oz (106.6 kg)  08/31/13 235 lb 0.2 oz (106.6 kg)     Past Medical History  Diagnosis Date  . History of peptic ulcer disease   . Anemia   . Hyperlipidemia   . Aortic stenosis    . Peripheral vascular disease   . Atrial fibrillation   . Carotid stenosis   . Renal artery stenosis   . CAD (coronary artery disease)   . Hypertension   . Myocardial infarction     X2  . COPD (chronic obstructive pulmonary disease)   . Pneumonia     HX OF  . Sleep apnea     OXYGEN AT NIGHT 2L [place]  . Diabetes mellitus without complication     FASTING 98-120S  . GERD (gastroesophageal reflux disease)   . History of blood transfusion   . Depression   . Anxiety   . Acute myocardial infarction, unspecified site, initial episode of care     Current Outpatient Prescriptions  Medication Sig Dispense Refill  . acetaminophen (TYLENOL) 500 MG tablet Take 500 mg by mouth every 6 (six) hours as needed for pain.       [name] acyclovir (ZOVIRAX) 800 MG tablet Take 400 mg by mouth 2 (two) times daily.       [name] albuterol (PROVENTIL,VENTOLIN) 90 MCG/ACT inhaler Inhale 2 puffs into the lungs every 4 (four) hours as needed for shortness of breath.       . allopurinol (ZYLOPRIM) 300 MG tablet Take 300  mg by mouth daily.        [name] aspirin EC 81 MG tablet Take 1 tablet (81 mg total) by mouth daily.      [name] atorvastatin (LIPITOR) 40 MG tablet Take 40 mg by mouth at bedtime.      [name] azelastine (ASTELIN) 137 MCG/SPRAY nasal spray Place 2 sprays into both nostrils daily.       . benazepril (LOTENSIN) 20 MG tablet Take 10 mg by mouth daily.       . budesonide-formoterol (SYMBICORT) 80-4.5 MCG/ACT inhaler Inhale 2 puffs into the lungs 2 (two) times daily as needed (bronchitis).      [name] buPROPion (WELLBUTRIN XL) 150 MG 24 hr tablet Take 150 mg by mouth daily.      . Cholecalciferol (VITAMIN D) 2000 UNITS CAPS Take 2,000 Units by mouth daily.      . dabigatran (PRADAXA) 150 MG CAPS Take 150 mg by mouth 2 (two) times daily.       .  ferrous sulfate 325 (65 FE) MG tablet Take 325 mg by mouth daily with breakfast.       . furosemide (LASIX) 20 MG tablet Take 20 mg by mouth daily.        [name] ipratropium-albuterol (DUONEB) 0.5-2.5 (3) MG/3ML SOLN Take 3 mLs by nebulization 3 (three) times daily as needed (for bronchitis or other virus that affects breathing).       . lansoprazole (PREVACID) 30 MG capsule Take 30 mg by mouth daily.        . methocarbamol (ROBAXIN) 750 MG tablet Take 750 mg by mouth 2 (two) times daily.       . metoprolol (LOPRESSOR) 50 MG tablet Take 1 tablet (50 mg total) by mouth 2 (two) times daily.  180 tablet  4  . Multiple Vitamin (MULTIVITAMIN WITH MINERALS) TABS Take 1 tablet by mouth daily.      . multivitamin-lutein (OCUVITE-LUTEIN) CAPS Take 1 capsule by mouth daily.      [name] terazosin (HYTRIN) 5 MG capsule Take 5 mg by mouth at bedtime.       . trolamine salicylate (ASPERCREME) 10 % cream Apply 1 application topically 4 (four) times daily as needed (pain).        No current facility-administered medications for this visit.    Allergies:   Bactrim   Social History:  The patient  reports that he quit smoking about 20 years ago. He does not have any smokeless tobacco history on file. He reports that he does not drink alcohol or use illicit drugs.   Family History:  The patient's family history includes Coronary artery disease in his father, mother, and another family member; Heart disease in his father, mother, and another family member.   ROS:  Please see the history of present illness.      All other systems reviewed and negative.   PHYSICAL EXAM: VS:  BP 128/58  Pulse 72  Ht 6' (1.829 m)  Wt 240 lb (108.863 kg)  BMI 32.54 kg/m2 Well nourished, well developed, in no acute distress HEENT: normal Neck: no JVD Cardiac:  normal S1, S2; RRR; Harsh 2/6 crescendo decrescendo systolic murmur heard best at the RUSB Lungs:  clear to auscultation bilaterally, no wheezing, rhonchi or rales Abd: soft,  nontender, no hepatomegaly Ext: no edemaright groin without hematoma or bruit  Skin: warm and dry Neuro:  CNs 2-12 intact, no focal abnormalities noted  EKG:  Atrial fibrillation, HR 72, LAD, nonspecific ST-T wave changes  ASSESSMENT AND PLAN:  1. CAD Status Post CABG:  He is doing well after recent non-STEMI treated with POBA to the apical LAD. I have recommended that he remain on low-dose aspirin in addition to the Pradaxa for now. He is not interested in cardiac rehabilitation. He will continue to increase activity on his own. Continue statin, beta blocker. 2. Aortic Stenosis:  Moderate by recent assessment. This will require followup echocardiograms in the future. 3. Carotid Stenosis:  Continue follow up with vascular surgery. 4. Atrial Fibrillation:  Rate controlled. Continue Pradaxa. 5. Renal Artery Stenosis:  Continue statin. Recent creatinine normal. 6. Hypertension:  Controlled. 7. Hyperlipidemia:  Continue statin. 8. Disposition:  Follow up with Dr. Stanford Breed in 6-8 weeks.  Signed, Richardson Dopp, PA-C  09/17/2013 1:54 PM

## 2013-09-17 NOTE — Patient Instructions (Signed)
Your physician recommends that you continue on your current medications as directed. Please refer to the Current Medication list given to you today.  Your physician recommends that you schedule a follow-up appointment in: 6 weeks with Dr. Stanford Breed

## 2013-09-21 ENCOUNTER — Encounter: Payer: Self-pay | Admitting: Vascular Surgery

## 2013-09-22 ENCOUNTER — Encounter: Payer: Self-pay | Admitting: Vascular Surgery

## 2013-09-22 ENCOUNTER — Ambulatory Visit (INDEPENDENT_AMBULATORY_CARE_PROVIDER_SITE_OTHER): Payer: Medicare Other | Admitting: Vascular Surgery

## 2013-09-22 ENCOUNTER — Ambulatory Visit (HOSPITAL_COMMUNITY)
Admission: RE | Admit: 2013-09-22 | Discharge: 2013-09-22 | Disposition: A | Discharge status: Home / Self Care | Payer: Medicare Other | Source: Ambulatory Visit | Attending: Vascular Surgery | Admitting: Vascular Surgery

## 2013-09-22 VITALS — BP 110/56 | HR 75 | Ht 72.0 in | Wt 239.0 lb

## 2013-09-22 DIAGNOSIS — I6529 Occlusion and stenosis of unspecified carotid artery: Secondary | ICD-10-CM | POA: Diagnosis not present

## 2013-09-22 DIAGNOSIS — Z48812 Encounter for surgical aftercare following surgery on the circulatory system: Secondary | ICD-10-CM | POA: Insufficient documentation

## 2013-09-22 NOTE — Addendum Note (Signed)
Addended by: Dorthula Rue L on: 09/22/2013 03:46 PM   Modules accepted: Orders

## 2013-09-22 NOTE — Progress Notes (Signed)
Today for followup of his right carotid endarterectomy for severe asymptomatic disease in August of 2014. He's had no neurologic deficits. He has had a very uneventful last 3 months from a health standpoint. He was admitted with congestive heart failure in January 2015. In February he was admitted to [facility] for a for further viscus with primary repair. In March she had a myocardial infarction and had cardiac catheterization and what sounds like angioplasty of right coronary. He looks quite good today for what is been for the last 3 months.  Past Medical History  Diagnosis Date  . History of peptic ulcer disease   . Anemia   . Hyperlipidemia   . Aortic stenosis    . Peripheral vascular disease   . Atrial fibrillation   . Carotid stenosis   . Renal artery stenosis   . CAD (coronary artery disease)   . Hypertension   . Myocardial infarction     X2  . COPD (chronic obstructive pulmonary disease)   . Pneumonia     HX OF  . Sleep apnea     OXYGEN AT NIGHT 2L [place]  . Diabetes mellitus without complication     FASTING 98-120S  . GERD (gastroesophageal reflux disease)   . History of blood transfusion   . Depression   . Anxiety   . Acute myocardial infarction, unspecified site, initial episode of care   . CHF (congestive heart failure)     History  Substance Use Topics  . Smoking status: Former Smoker    Quit date: 04/27/1993  . Smokeless tobacco: Not on file  . Alcohol Use: No    Family History  Problem Relation Age of Onset  . Coronary artery disease Mother   . Heart disease Mother   . Coronary artery disease Father   . Heart disease Father   . Coronary artery disease      13 sibling, almost all have coronary disease and some with premature onset  . Heart disease      13 sibling, almost all have coronary disease and some with premature onset    Allergies  Allergen Reactions  . Bactrim [Sulfamethoxazole-Tmp Ds] Other (See Comments)    "does not do anything for me"     Current outpatient prescriptions:acetaminophen (TYLENOL) 500 MG tablet, Take 500 mg by mouth every 6 (six) hours as needed for pain. , Disp: , Rfl: ;  acyclovir (ZOVIRAX) 800 MG tablet, Take 400 mg by mouth 2 (two) times daily. , Disp: , Rfl: ;  albuterol (PROVENTIL,VENTOLIN) 90 MCG/ACT inhaler, Inhale 2 puffs into the lungs every 4 (four) hours as needed for shortness of breath. , Disp: , Rfl:  allopurinol (ZYLOPRIM) 300 MG tablet, Take 300 mg by mouth daily.  , Disp: , Rfl: ;  aspirin EC 81 MG tablet, Take 1 tablet (81 mg total) by mouth daily., Disp: , Rfl: ;  atorvastatin (LIPITOR) 40 MG tablet, Take 40 mg by mouth at bedtime., Disp: , Rfl: ;  azelastine (ASTELIN) 137 MCG/SPRAY nasal spray, Place 2 sprays into both nostrils daily. , Disp: , Rfl: ;  benazepril (LOTENSIN) 20 MG tablet, Take 10 mg by mouth daily. , Disp: , Rfl:  budesonide-formoterol (SYMBICORT) 80-4.5 MCG/ACT inhaler, Inhale 2 puffs into the lungs 2 (two) times daily as needed (bronchitis)., Disp: , Rfl: ;  buPROPion (WELLBUTRIN XL) 150 MG 24 hr tablet, Take 150 mg by mouth daily., Disp: , Rfl: ;  Cholecalciferol (VITAMIN D) 2000 UNITS CAPS, Take 2,000 Units by mouth  daily., Disp: , Rfl: ;  dabigatran (PRADAXA) 150 MG CAPS, Take 150 mg by mouth 2 (two) times daily. , Disp: , Rfl:  ferrous sulfate 325 (65 FE) MG tablet, Take 325 mg by mouth daily with breakfast. , Disp: , Rfl: ;  furosemide (LASIX) 20 MG tablet, Take 20 mg by mouth daily.  , Disp: , Rfl: ;  ipratropium-albuterol (DUONEB) 0.5-2.5 (3) MG/3ML SOLN, Take 3 mLs by nebulization 3 (three) times daily as needed (for bronchitis or other virus that affects breathing). , Disp: , Rfl: ;  lansoprazole (PREVACID) 30 MG capsule, Take 30 mg by mouth daily.  , Disp: , Rfl:  methocarbamol (ROBAXIN) 750 MG tablet, Take 750 mg by mouth 2 (two) times daily. , Disp: , Rfl: ;  metoprolol (LOPRESSOR) 50 MG tablet, Take 1 tablet (50 mg total) by mouth 2 (two) times daily., Disp: 180 tablet, Rfl:  4;  Multiple Vitamin (MULTIVITAMIN WITH MINERALS) TABS, Take 1 tablet by mouth daily., Disp: , Rfl: ;  multivitamin-lutein (OCUVITE-LUTEIN) CAPS, Take 1 capsule by mouth daily., Disp: , Rfl:  terazosin (HYTRIN) 5 MG capsule, Take 5 mg by mouth at bedtime. , Disp: , Rfl: ;  trolamine salicylate (ASPERCREME) 10 % cream, Apply 1 application topically 4 (four) times daily as needed (pain). , Disp: , Rfl:   BP 110/56  Pulse 75  Ht 6' (1.829 m)  Wt 239 lb (108.41 kg)  BMI 32.41 kg/m2  SpO2 98%  Body mass index is 32.41 kg/(m^2).       Physical exam well-developed well-nourished gentleman in no acute distress Neurologically he is grossly intact 2+ radial pulses bilaterally Carotid arteries without bruits bilaterally Well-healed right neck incision  Carotid duplex today reveals widely patent endarterectomy on the right with this recurrent stenosis. On the left he does have 40-59% stenosis  Impression and plan: Stable following endarterectomy on the right carotid. We'll see him again in 6 months with repeat carotid duplex. He will present immediately to the hospital should he develop any stroke symptoms

## 2013-10-08 ENCOUNTER — Encounter: Payer: Self-pay | Admitting: Cardiology

## 2013-10-08 DIAGNOSIS — D509 Iron deficiency anemia, unspecified: Secondary | ICD-10-CM | POA: Diagnosis not present

## 2013-10-08 DIAGNOSIS — I2510 Atherosclerotic heart disease of native coronary artery without angina pectoris: Secondary | ICD-10-CM | POA: Diagnosis not present

## 2013-10-08 DIAGNOSIS — I10 Essential (primary) hypertension: Secondary | ICD-10-CM | POA: Diagnosis not present

## 2013-10-08 DIAGNOSIS — I5031 Acute diastolic (congestive) heart failure: Secondary | ICD-10-CM | POA: Diagnosis not present

## 2013-10-08 DIAGNOSIS — Z6832 Body mass index (BMI) 32.0-32.9, adult: Secondary | ICD-10-CM | POA: Diagnosis not present

## 2013-10-08 DIAGNOSIS — M109 Gout, unspecified: Secondary | ICD-10-CM | POA: Diagnosis not present

## 2013-10-08 DIAGNOSIS — E119 Type 2 diabetes mellitus without complications: Secondary | ICD-10-CM | POA: Diagnosis not present

## 2013-10-08 DIAGNOSIS — E785 Hyperlipidemia, unspecified: Secondary | ICD-10-CM | POA: Diagnosis not present

## 2013-10-27 DIAGNOSIS — J209 Acute bronchitis, unspecified: Secondary | ICD-10-CM | POA: Diagnosis not present

## 2013-10-27 DIAGNOSIS — Z6832 Body mass index (BMI) 32.0-32.9, adult: Secondary | ICD-10-CM | POA: Diagnosis not present

## 2013-11-09 DIAGNOSIS — J209 Acute bronchitis, unspecified: Secondary | ICD-10-CM | POA: Diagnosis not present

## 2013-11-09 DIAGNOSIS — R059 Cough, unspecified: Secondary | ICD-10-CM | POA: Diagnosis not present

## 2013-11-09 DIAGNOSIS — R05 Cough: Secondary | ICD-10-CM | POA: Diagnosis not present

## 2013-12-07 ENCOUNTER — Encounter: Payer: Self-pay | Admitting: Cardiology

## 2013-12-07 ENCOUNTER — Ambulatory Visit (INDEPENDENT_AMBULATORY_CARE_PROVIDER_SITE_OTHER): Payer: Medicare Other | Admitting: Cardiology

## 2013-12-07 VITALS — BP 136/64 | HR 64 | Ht 72.0 in | Wt 242.9 lb

## 2013-12-07 DIAGNOSIS — I359 Nonrheumatic aortic valve disorder, unspecified: Secondary | ICD-10-CM

## 2013-12-07 DIAGNOSIS — I679 Cerebrovascular disease, unspecified: Secondary | ICD-10-CM

## 2013-12-07 DIAGNOSIS — I701 Atherosclerosis of renal artery: Secondary | ICD-10-CM

## 2013-12-07 DIAGNOSIS — I219 Acute myocardial infarction, unspecified: Secondary | ICD-10-CM

## 2013-12-07 DIAGNOSIS — I2584 Coronary atherosclerosis due to calcified coronary lesion: Secondary | ICD-10-CM

## 2013-12-07 DIAGNOSIS — I10 Essential (primary) hypertension: Secondary | ICD-10-CM

## 2013-12-07 DIAGNOSIS — I2510 Atherosclerotic heart disease of native coronary artery without angina pectoris: Secondary | ICD-10-CM | POA: Diagnosis not present

## 2013-12-07 DIAGNOSIS — I4891 Unspecified atrial fibrillation: Secondary | ICD-10-CM

## 2013-12-07 DIAGNOSIS — I482 Chronic atrial fibrillation, unspecified: Secondary | ICD-10-CM

## 2013-12-07 DIAGNOSIS — E785 Hyperlipidemia, unspecified: Secondary | ICD-10-CM

## 2013-12-07 DIAGNOSIS — I350 Nonrheumatic aortic (valve) stenosis: Secondary | ICD-10-CM

## 2013-12-07 NOTE — Assessment & Plan Note (Signed)
Continue statin. Not on aspirin given need for anticoagulation. 

## 2013-12-07 NOTE — Assessment & Plan Note (Signed)
Blood pressure controlled. Continue present medications. 

## 2013-12-07 NOTE — Patient Instructions (Signed)
Your physician wants you to follow-up in: [place] will receive a reminder letter in the mail two months in advance. If you don't receive a letter, please call our office to schedule the follow-up appointment.

## 2013-12-07 NOTE — Assessment & Plan Note (Signed)
Continue statin. Followed by vascular surgery. 

## 2013-12-07 NOTE — Assessment & Plan Note (Signed)
Continue statin. 

## 2013-12-07 NOTE — Progress Notes (Signed)
HPI: FU coronary artery disease, status post coronary artery bypassing graft, permanent atrial fibrillation, AS and peripheral vascular disease (renal artery stenosis and cerebrovascular disease). Renal Dopplers were performed in June 2013 and revealed 1-59% bilateral stenosis. Carotid Dopplers in June of 2014 showed 80-99% right and 69% left stenosis. Abnormal thyroid. Dedicated thyroid ultrasound showed multinodular border and a dominant nodule in the right lower pole and aspiration recommended. Cytology negative. Patient referred to vascular surgery. Patient then had right carotid endarterectomy in August of 2014. Now followed by vascular surgery. Echocardiogram in March 2015 showed normal LV function, moderate aortic stenosis (mean gradient 32), mild aortic insufficiency, biatrial enlargement.  Cath 3/15 showed EF 45, normal LM, 100 LAD, Lcx and RCA; sequential SVG to OM2 and OM3 patent, LIMA to LAD patent. Patient had PTCA of distal LAD via LIMA graft. Since last seen the patient denies any dyspnea on exertion, orthopnea, PND, palpitations, syncope or chest pain. Mild pedal edema   Current Outpatient Prescriptions  Medication Sig Dispense Refill  . acetaminophen (TYLENOL) 500 MG tablet Take 500 mg by mouth every 6 (six) hours as needed for pain.       [name] acyclovir (ZOVIRAX) 800 MG tablet Take 400 mg by mouth 2 (two) times daily.       [name] albuterol (PROVENTIL,VENTOLIN) 90 MCG/ACT inhaler Inhale 2 puffs into the lungs every 4 (four) hours as needed for shortness of breath.       [name] atorvastatin (LIPITOR) 40 MG tablet Take 40 mg by mouth at bedtime.      [name] azelastine (ASTELIN) 137 MCG/SPRAY nasal spray Place 2 sprays into both nostrils daily.       . benazepril (LOTENSIN) 20 MG tablet Take 10 mg by mouth daily.       [name] buPROPion (WELLBUTRIN XL) 150 MG 24 hr tablet Take 150 mg by mouth daily.      . Cholecalciferol (VITAMIN D) 2000 UNITS CAPS Take 2,000 Units by mouth daily.      . dabigatran  (PRADAXA) 150 MG CAPS Take 150 mg by mouth 2 (two) times daily.       . ferrous sulfate 325 (65 FE) MG tablet Take 325 mg by mouth daily with breakfast.       . furosemide (LASIX) 20 MG tablet Take 20 mg by mouth daily.        [name] ipratropium-albuterol (DUONEB) 0.5-2.5 (3) MG/3ML SOLN Take 3 mLs by nebulization 3 (three) times daily as needed (for bronchitis or other virus that affects breathing).       . lansoprazole (PREVACID) 30 MG capsule Take 30 mg by mouth daily.        . methocarbamol (ROBAXIN) 750 MG tablet Take 750 mg by mouth 2 (two) times daily.       . metoprolol (LOPRESSOR) 50 MG tablet Take 1 tablet (50 mg total) by mouth 2 (two) times daily.  180 tablet  4  . Multiple Vitamin (MULTIVITAMIN WITH MINERALS) TABS Take 1 tablet by mouth daily.      . multivitamin-lutein (OCUVITE-LUTEIN) CAPS Take 1 capsule by mouth daily.      [name] terazosin (HYTRIN) 5 MG capsule Take 5 mg by mouth at bedtime.       . trolamine salicylate (ASPERCREME) 10 % cream Apply 1 application topically 4 (four) times daily as needed (pain).        No current facility-administered medications for this visit.     Past Medical History  Diagnosis Date  .  History of peptic ulcer disease   . Anemia   . Hyperlipidemia   . Aortic stenosis    . Peripheral vascular disease   . Atrial fibrillation   . Carotid stenosis   . Renal artery stenosis   . CAD (coronary artery disease)   . Hypertension   . Myocardial infarction     X2  . COPD (chronic obstructive pulmonary disease)   . Pneumonia     HX OF  . Sleep apnea     OXYGEN AT NIGHT 2L   . Diabetes mellitus without complication     FASTING 98-120S  . GERD (gastroesophageal reflux disease)   . History of blood transfusion   . Depression   . Anxiety   . Acute myocardial infarction, unspecified site, initial episode of care   . CHF (congestive heart failure)     Past Surgical History  Procedure Laterality Date  . Coronary artery bypass graft  1994  .  Renal artery stent      stenting of the left renal artery as well as a cutting balloon angioplasty for treatment of in-stent restenosis coronary artery bypass grafting in 1994.  [name] Knee surgery  08/2003    left knee/ARTHROSCOPIC  . Removal of bleeding ulcer  1994  . Endarterectomy Right 01/05/2013    Procedure: ENDARTERECTOMY CAROTID;  Surgeon: Rosetta Posner, MD;  Location: [facility];  Service: Vascular;  Laterality: Right;  . Patch angioplasty Right 01/05/2013    Procedure: PATCH ANGIOPLASTY;  Surgeon: Rosetta Posner, MD;  Location: [facility] OR;  Service: Vascular;  Laterality: Right;  . Carotid endarterectomy      History   Social History  . Marital Status: Married    Spouse Name: N/A    Number of Children: N/A  . Years of Education: N/A   Occupational History  . Not on file.   Social History Main Topics  . Smoking status: Former Smoker    Quit date: 04/27/1993  . Smokeless tobacco: Not on file  . Alcohol Use: No  . Drug Use: No  . Sexual Activity: Yes   Other Topics Concern  . Not on file   Social History Narrative   Social History:   Married    Tobacco Use - No.    Alcohol Use - yes         Family History:   Mother died at age 69 of coronary disease.  Father died at 70 of coronary disease.  He has 13 siblings, almost all of whom have coronary disease and some with premature onset.    ROS: no fevers or chills, productive cough, hemoptysis, dysphasia, odynophagia, melena, hematochezia, dysuria, hematuria, rash, seizure activity, orthopnea, PND, claudication. Remaining systems are negative.  Physical Exam: Well-developed well-nourished in no acute distress.  Skin is warm and dry.  HEENT is normal.  Neck is supple.  Chest is clear to auscultation with normal expansion.  Cardiovascular exam is irregular Abdominal exam nontender or distended. No masses palpated. Extremities show trace edema. neuro grossly intact

## 2013-12-07 NOTE — Assessment & Plan Note (Signed)
Continue beta blocker and pradaxa.

## 2013-12-07 NOTE — Assessment & Plan Note (Signed)
Followup echocardiogram March 2016.Have explained that he may require aortic valve replacement in the future.

## 2014-01-14 DIAGNOSIS — M109 Gout, unspecified: Secondary | ICD-10-CM | POA: Diagnosis not present

## 2014-01-14 DIAGNOSIS — I10 Essential (primary) hypertension: Secondary | ICD-10-CM | POA: Diagnosis not present

## 2014-01-14 DIAGNOSIS — Z1331 Encounter for screening for depression: Secondary | ICD-10-CM | POA: Diagnosis not present

## 2014-01-14 DIAGNOSIS — Z6831 Body mass index (BMI) 31.0-31.9, adult: Secondary | ICD-10-CM | POA: Diagnosis not present

## 2014-01-14 DIAGNOSIS — E1149 Type 2 diabetes mellitus with other diabetic neurological complication: Secondary | ICD-10-CM | POA: Diagnosis not present

## 2014-01-14 DIAGNOSIS — Z9181 History of falling: Secondary | ICD-10-CM | POA: Diagnosis not present

## 2014-01-14 DIAGNOSIS — I2510 Atherosclerotic heart disease of native coronary artery without angina pectoris: Secondary | ICD-10-CM | POA: Diagnosis not present

## 2014-01-14 DIAGNOSIS — D509 Iron deficiency anemia, unspecified: Secondary | ICD-10-CM | POA: Diagnosis not present

## 2014-01-14 DIAGNOSIS — E785 Hyperlipidemia, unspecified: Secondary | ICD-10-CM | POA: Diagnosis not present

## 2014-01-14 DIAGNOSIS — I4891 Unspecified atrial fibrillation: Secondary | ICD-10-CM | POA: Diagnosis not present

## 2014-01-25 DIAGNOSIS — R609 Edema, unspecified: Secondary | ICD-10-CM | POA: Diagnosis not present

## 2014-01-25 DIAGNOSIS — J449 Chronic obstructive pulmonary disease, unspecified: Secondary | ICD-10-CM | POA: Diagnosis not present

## 2014-01-25 DIAGNOSIS — R05 Cough: Secondary | ICD-10-CM | POA: Diagnosis not present

## 2014-01-25 DIAGNOSIS — R059 Cough, unspecified: Secondary | ICD-10-CM | POA: Diagnosis not present

## 2014-02-11 DIAGNOSIS — L03119 Cellulitis of unspecified part of limb: Secondary | ICD-10-CM | POA: Diagnosis not present

## 2014-02-11 DIAGNOSIS — L02419 Cutaneous abscess of limb, unspecified: Secondary | ICD-10-CM | POA: Diagnosis not present

## 2014-02-11 DIAGNOSIS — Z6832 Body mass index (BMI) 32.0-32.9, adult: Secondary | ICD-10-CM | POA: Diagnosis not present

## 2014-02-25 DIAGNOSIS — J069 Acute upper respiratory infection, unspecified: Secondary | ICD-10-CM | POA: Diagnosis not present

## 2014-03-10 DIAGNOSIS — Z23 Encounter for immunization: Secondary | ICD-10-CM | POA: Diagnosis not present

## 2014-03-15 ENCOUNTER — Encounter: Payer: Self-pay | Admitting: Family

## 2014-03-15 ENCOUNTER — Ambulatory Visit (HOSPITAL_COMMUNITY)
Admission: RE | Admit: 2014-03-15 | Discharge: 2014-03-15 | Disposition: A | Discharge status: Home / Self Care | Payer: Medicare Other | Source: Ambulatory Visit | Attending: Family | Admitting: Family

## 2014-03-15 ENCOUNTER — Ambulatory Visit (INDEPENDENT_AMBULATORY_CARE_PROVIDER_SITE_OTHER): Payer: Medicare Other | Admitting: Family

## 2014-03-15 VITALS — BP 128/76 | HR 65 | Resp 16 | Ht 72.0 in | Wt 238.0 lb

## 2014-03-15 DIAGNOSIS — Z48812 Encounter for surgical aftercare following surgery on the circulatory system: Secondary | ICD-10-CM | POA: Insufficient documentation

## 2014-03-15 DIAGNOSIS — I6523 Occlusion and stenosis of bilateral carotid arteries: Secondary | ICD-10-CM

## 2014-03-15 DIAGNOSIS — I213 ST elevation (STEMI) myocardial infarction of unspecified site: Secondary | ICD-10-CM

## 2014-03-15 DIAGNOSIS — I6529 Occlusion and stenosis of unspecified carotid artery: Secondary | ICD-10-CM | POA: Insufficient documentation

## 2014-03-15 NOTE — Addendum Note (Signed)
Addended by: Mena Goes on: 03/15/2014 05:41 PM   Modules accepted: Orders

## 2014-03-15 NOTE — Progress Notes (Signed)
Established Carotid Patient   History of Present Illness  [name] is a 80 y.o. male patient of Dr. Donnetta Hutching who returns today for followup of his right carotid endarterectomy for severe asymptomatic disease in August of 2014.  He was admitted with congestive heart failure in January 2015.  In March, 2015 she had a myocardial infarction and had cardiac catheterization and what sounds like angioplasty of right coronary. February, 2015 he had repair of perforated small intestine, does not know etiology. Patient denies any history of stroke or TIA, specifically he denies amaurosis fugax or monocular blindness,  denies facial drooping, denies hemiplegia, denies receptive or expressive aphasia.   Patient denies claudication symptoms in legs with walking, denies non healing wounds. He walks his dog 4-5 x/day.  Pt denies New Medical or Surgical History: other than the above.  Pt Diabetic: Yes, diet controlled Pt smoker: former smoker, quit in 1994, same day he had CABG  Pt meds include: Statin : Yes ASA: No Other anticoagulants/antiplatelets: Pradaxa for atrial fib, managed by Dr. Lelon Perla office   Past Medical History  Diagnosis Date  . History of peptic ulcer disease   . Anemia   . Hyperlipidemia   . Aortic stenosis    . Peripheral vascular disease   . Atrial fibrillation   . Carotid stenosis   . Renal artery stenosis   . CAD (coronary artery disease)   . Hypertension   . Myocardial infarction     X2  . COPD (chronic obstructive pulmonary disease)   . Pneumonia     HX OF  . Sleep apnea     OXYGEN AT NIGHT 2L [place]  . Diabetes mellitus without complication     FASTING 98-120S  . GERD (gastroesophageal reflux disease)   . History of blood transfusion   . Depression   . Anxiety   . Acute myocardial infarction, unspecified site, initial episode of care   . CHF (congestive heart failure)     Social History History  Substance Use Topics  . Smoking status: Former  Smoker    Quit date: 04/27/1993  . Smokeless tobacco: Not on file  . Alcohol Use: No    Family History Family History  Problem Relation Age of Onset  . Coronary artery disease Mother   . Heart disease Mother   . Coronary artery disease Father   . Heart disease Father   . Coronary artery disease      13 sibling, almost all have coronary disease and some with premature onset  . Heart disease      13 sibling, almost all have coronary disease and some with premature onset    Surgical History Past Surgical History  Procedure Laterality Date  . Coronary artery bypass graft  1994  . Renal artery stent      stenting of the left renal artery as well as a cutting balloon angioplasty for treatment of in-stent restenosis coronary artery bypass grafting in 1994.  [name] Knee surgery  08/2003    left knee/ARTHROSCOPIC  . Removal of bleeding ulcer  1994  . Endarterectomy Right 01/05/2013    Procedure: ENDARTERECTOMY CAROTID;  Surgeon: Rosetta Posner, MD;  Location: [facility];  Service: Vascular;  Laterality: Right;  . Patch angioplasty Right 01/05/2013    Procedure: PATCH ANGIOPLASTY;  Surgeon: Rosetta Posner, MD;  Location: [facility] OR;  Service: Vascular;  Laterality: Right;  . Carotid endarterectomy      Allergies  Allergen Reactions  . Bactrim [Sulfamethoxazole-Tmp  Ds] Other (See Comments)    "does not do anything for me"    Current Outpatient Prescriptions  Medication Sig Dispense Refill  . acetaminophen (TYLENOL) 500 MG tablet Take 500 mg by mouth every 6 (six) hours as needed for pain.       [name] acyclovir (ZOVIRAX) 800 MG tablet Take 400 mg by mouth 2 (two) times daily.       [name] albuterol (PROVENTIL,VENTOLIN) 90 MCG/ACT inhaler Inhale 2 puffs into the lungs every 4 (four) hours as needed for shortness of breath.       [name] atorvastatin (LIPITOR) 40 MG tablet Take 40 mg by mouth at bedtime.      [name] azelastine (ASTELIN) 137 MCG/SPRAY nasal spray Place 2 sprays into both nostrils daily.       . benazepril  (LOTENSIN) 20 MG tablet Take 10 mg by mouth daily.       [name] buPROPion (WELLBUTRIN XL) 150 MG 24 hr tablet Take 150 mg by mouth daily.      . Cholecalciferol (VITAMIN D) 2000 UNITS CAPS Take 2,000 Units by mouth daily.      . dabigatran (PRADAXA) 150 MG CAPS Take 150 mg by mouth 2 (two) times daily.       . ferrous sulfate 325 (65 FE) MG tablet Take 325 mg by mouth daily with breakfast.       . furosemide (LASIX) 20 MG tablet Take 20 mg by mouth daily.        [name] ipratropium-albuterol (DUONEB) 0.5-2.5 (3) MG/3ML SOLN Take 3 mLs by nebulization 3 (three) times daily as needed (for bronchitis or other virus that affects breathing).       . lansoprazole (PREVACID) 30 MG capsule Take 30 mg by mouth daily.        . methocarbamol (ROBAXIN) 750 MG tablet Take 750 mg by mouth 2 (two) times daily.       . metoprolol (LOPRESSOR) 50 MG tablet Take 1 tablet (50 mg total) by mouth 2 (two) times daily.  180 tablet  4  . Multiple Vitamin (MULTIVITAMIN WITH MINERALS) TABS Take 1 tablet by mouth daily.      . multivitamin-lutein (OCUVITE-LUTEIN) CAPS Take 1 capsule by mouth daily.      [name] terazosin (HYTRIN) 5 MG capsule Take 5 mg by mouth at bedtime.       . trolamine salicylate (ASPERCREME) 10 % cream Apply 1 application topically 4 (four) times daily as needed (pain).        No current facility-administered medications for this visit.    Review of Systems : See HPI for pertinent positives and negatives.  Physical Examination  Filed Vitals:   03/15/14 1401 03/15/14 1405  BP: 114/64 128/76  Pulse: 71 65  Resp:  16  Height:  6' (1.829 m)  Weight:  238 lb (107.956 kg)  SpO2:  97%   Body mass index is 32.27 kg/(m^2).  General: WDWN obese male in NAD GAIT: normal Eyes: PERRLA Pulmonary:  Non-labored, CTAB, Negative  Rales, Negative rhonchi, & Negative wheezing.  Cardiac: Irregular Rhythm, feet appear well perfused.   VASCULAR EXAM Carotid Bruits Right Left   Negative Negative    Radial pulses are  2+ palpable and equal.  LE Pulses Right Left       POPLITEAL  not palpable   not palpable       POSTERIOR TIBIAL  not palpable   not palpable        DORSALIS PEDIS      ANTERIOR TIBIAL not palpable  1+ palpable     Gastrointestinal: soft, nontender, BS WNL, no r/g,  no masses palpated.  Musculoskeletal: Negative muscle atrophy/wasting. M/S 5/5 throughout, Extremities without ischemic changes.  Neurologic: A&O X 3; Appropriate Affect ; SENSATION ;normal;  Speech is normal CN 2-12 intact, Pain and light touch intact in extremities, Motor exam as listed above.   Non-Invasive Vascular Imaging CAROTID DUPLEX 03/15/2014   CEREBROVASCULAR DUPLEX EVALUATION    INDICATION: Carotid stenosis    PREVIOUS INTERVENTION(S): Right carotid endarterectomy performed 01/05/2013.    DUPLEX EXAM:     RIGHT  LEFT  Peak Systolic Velocities (cm/s) End Diastolic Velocities (cm/s) Plaque LOCATION Peak Systolic Velocities (cm/s) End Diastolic Velocities (cm/s) Plaque  68 9  CCA PROXIMAL 53 6   57 11  CCA MID 52 9   55 8  CCA DISTAL 55 13 HT  102 0 HM ECA 101 0 HT  50 15  ICA PROXIMAL 187 59 HT  68 17  ICA MID 86 21 HT  78 23  ICA DISTAL 60 18     carotid endarterectomy ICA / CCA Ratio (PSV) 3.6  Antegrade Vertebral Flow Antegrade  123456 Brachial Systolic Pressure (mmHg) [id number]  Triphasic Brachial Artery Waveforms Triphasic    Plaque Morphology:  HM = Homogeneous, HT = Heterogeneous, CP = Calcific Plaque, SP = Smooth Plaque, IP = Irregular Plaque     ADDITIONAL FINDINGS:     IMPRESSION: Right internal carotid artery is patent with history of carotid endarterectomy, no hyperplasia or hemodynamically significant stenosis present. Left internal carotid artery stenosis present in the 40%-59% range.    Compared to the previous exam:  Essentially unchanged since previous study on  09/22/2013.      Assessment: AADVIK HOERL is a 80 y.o. male who is s/p right carotid endarterectomy on 01/05/2013. He presents with asymptomatic patent right CEA site and 40 - 59 % left ICA stenosis. The  ICA stenosis is essentially unchanged since previous study on 09/22/2013.  Plan: Follow-up in 1 year with Carotid Duplex scan.   I discussed in depth with the patient the nature of atherosclerosis, and emphasized the importance of maximal medical management including strict control of blood pressure, blood glucose, and lipid levels, obtaining regular exercise, and continued cessation of smoking.  The patient is aware that without maximal medical management the underlying atherosclerotic disease process will progress, limiting the benefit of any interventions. The patient was given information about stroke prevention and what symptoms should prompt the patient to seek immediate medical care. Thank you for allowing [place] to participate in this patient's care.  Clemon Chambers, RN, MSN, FNP-C [facility] Office: [phone]  Clinic Physician: Trula Slade  03/15/2014 1:58 PM

## 2014-03-15 NOTE — Patient Instructions (Signed)
Stroke Prevention Some health problems and behaviors may make it more likely for you to have a stroke. Below are ways to lessen your risk of having a stroke.   Be active for at least 30 minutes on most or all days.  Do not smoke. Try not to be around others who smoke.  Do not drink too much alcohol.  Do not have more than 2 drinks a day if you are a man.  Do not have more than 1 drink a day if you are a woman and are not pregnant.  Eat healthy foods, such as fruits and vegetables. If you were put on a specific diet, follow the diet as told.  Keep your cholesterol levels under control through diet and medicines. Look for foods that are low in saturated fat, trans fat, cholesterol, and are high in fiber.  If you have diabetes, follow all diet plans and take your medicine as told.  If you have high blood pressure (hypertension), follow all diet plans and take your medicine as told.  Keep a healthy weight. Eat foods that are low in calories, salt, saturated fat, trans fat, and cholesterol.  Do not take drugs.  Avoid birth control pills, if this applies. Talk to your doctor about the risks of taking birth control pills.  Talk to your doctor if you have sleep problems (sleep apnea).  Take all medicine as told by your doctor.  You may be told to take aspirin or blood thinner medicine. Take this medicine as told by your doctor.  Understand your medicine instructions.  Make sure any other conditions you have are being taken care of. GET HELP RIGHT AWAY IF:  You suddenly lose feeling (you feel numb) or have weakness in your face, arm, or leg.  Your face or eyelid hangs down to one side.  You suddenly feel confused.  You have trouble talking (aphasia) or understanding what people are saying.  You suddenly have trouble seeing in one or both eyes.  You suddenly have trouble walking.  You are dizzy.  You lose your balance or your movements are clumsy (uncoordinated).  You  suddenly have a very bad headache and you do not know the cause.  You have new chest pain.  Your heart feels like it is fluttering or skipping a beat (irregular heartbeat). Do not wait to see if the symptoms above go away. Get help right away. Call your local emergency services (911 in [place] Do not drive yourself to the hospital. Document Released: 11/20/2011 Document Revised: 10/05/2013 Document Reviewed: 11/21/2012 Exit[employer] Patient Information 2015 [employer]. This information is not intended to replace advice given to you by your health care provider. Make sure you discuss any questions you have with your health care provider.  

## 2014-03-18 DIAGNOSIS — J019 Acute sinusitis, unspecified: Secondary | ICD-10-CM | POA: Diagnosis not present

## 2014-03-18 DIAGNOSIS — J209 Acute bronchitis, unspecified: Secondary | ICD-10-CM | POA: Diagnosis not present

## 2014-03-18 DIAGNOSIS — Z6832 Body mass index (BMI) 32.0-32.9, adult: Secondary | ICD-10-CM | POA: Diagnosis not present

## 2014-03-23 DIAGNOSIS — H2513 Age-related nuclear cataract, bilateral: Secondary | ICD-10-CM | POA: Diagnosis not present

## 2014-03-23 DIAGNOSIS — H34811 Central retinal vein occlusion, right eye: Secondary | ICD-10-CM | POA: Diagnosis not present

## 2014-03-24 ENCOUNTER — Other Ambulatory Visit (HOSPITAL_COMMUNITY): Payer: Medicare Other

## 2014-03-24 ENCOUNTER — Ambulatory Visit: Payer: Medicare Other | Admitting: Family

## 2014-03-29 DIAGNOSIS — J440 Chronic obstructive pulmonary disease with acute lower respiratory infection: Secondary | ICD-10-CM | POA: Diagnosis not present

## 2014-03-29 DIAGNOSIS — Z6833 Body mass index (BMI) 33.0-33.9, adult: Secondary | ICD-10-CM | POA: Diagnosis not present

## 2014-04-19 DIAGNOSIS — I2510 Atherosclerotic heart disease of native coronary artery without angina pectoris: Secondary | ICD-10-CM | POA: Diagnosis not present

## 2014-04-19 DIAGNOSIS — I4891 Unspecified atrial fibrillation: Secondary | ICD-10-CM | POA: Diagnosis not present

## 2014-04-19 DIAGNOSIS — E785 Hyperlipidemia, unspecified: Secondary | ICD-10-CM | POA: Diagnosis not present

## 2014-04-19 DIAGNOSIS — J209 Acute bronchitis, unspecified: Secondary | ICD-10-CM | POA: Diagnosis not present

## 2014-04-19 DIAGNOSIS — E1140 Type 2 diabetes mellitus with diabetic neuropathy, unspecified: Secondary | ICD-10-CM | POA: Diagnosis not present

## 2014-04-19 DIAGNOSIS — E611 Iron deficiency: Secondary | ICD-10-CM | POA: Diagnosis not present

## 2014-04-19 DIAGNOSIS — M109 Gout, unspecified: Secondary | ICD-10-CM | POA: Diagnosis not present

## 2014-04-19 DIAGNOSIS — I10 Essential (primary) hypertension: Secondary | ICD-10-CM | POA: Diagnosis not present

## 2014-04-21 ENCOUNTER — Encounter: Payer: Self-pay | Admitting: Cardiology

## 2014-05-13 ENCOUNTER — Encounter (HOSPITAL_COMMUNITY): Payer: Self-pay | Admitting: Interventional Cardiology

## 2014-05-22 DIAGNOSIS — Z6833 Body mass index (BMI) 33.0-33.9, adult: Secondary | ICD-10-CM | POA: Diagnosis not present

## 2014-05-22 DIAGNOSIS — S91309A Unspecified open wound, unspecified foot, initial encounter: Secondary | ICD-10-CM | POA: Diagnosis not present

## 2014-06-01 DIAGNOSIS — H25041 Posterior subcapsular polar age-related cataract, right eye: Secondary | ICD-10-CM | POA: Diagnosis not present

## 2014-06-01 DIAGNOSIS — H18411 Arcus senilis, right eye: Secondary | ICD-10-CM | POA: Diagnosis not present

## 2014-06-01 DIAGNOSIS — H25011 Cortical age-related cataract, right eye: Secondary | ICD-10-CM | POA: Diagnosis not present

## 2014-06-01 DIAGNOSIS — H2511 Age-related nuclear cataract, right eye: Secondary | ICD-10-CM | POA: Diagnosis not present

## 2014-06-03 DIAGNOSIS — Z6834 Body mass index (BMI) 34.0-34.9, adult: Secondary | ICD-10-CM | POA: Diagnosis not present

## 2014-06-03 DIAGNOSIS — J209 Acute bronchitis, unspecified: Secondary | ICD-10-CM | POA: Diagnosis not present

## 2014-06-10 ENCOUNTER — Telehealth: Payer: Self-pay | Admitting: *Deleted

## 2014-06-10 NOTE — Telephone Encounter (Signed)
Clearance for cataract surgery with the okay to hold pradaxa 2 days prior to the procedure and resume the day after faxed.

## 2014-06-15 NOTE — Progress Notes (Signed)
HPI: FU coronary artery disease, status post coronary artery bypassing graft, permanent atrial fibrillation, AS and peripheral vascular disease (renal artery stenosis and cerebrovascular disease). Renal Dopplers were performed in June 2013 and revealed 1-59% bilateral stenosis. Carotid Dopplers in June of 2014 showed 80-99% right and 69% left stenosis. Abnormal thyroid. Dedicated thyroid ultrasound showed multinodular border and a dominant nodule in the right lower pole and aspiration recommended. Cytology negative. Patient referred to vascular surgery. Patient then had right carotid endarterectomy in August of 2014. Now followed by vascular surgery. Echocardiogram in March 2015 showed normal LV function, moderate aortic stenosis (mean gradient 32), mild aortic insufficiency, biatrial enlargement. Cath 3/15 showed EF 45, normal LM, 100 LAD, Lcx and RCA; sequential SVG to OM2 and OM3 patent, LIMA to LAD patent. Patient had PTCA of distal LAD via LIMA graft. Since last seen the patient denies any dyspnea on exertion, orthopnea, PND, palpitations, syncope or chest pain. Mild pedal edema  Current Outpatient Prescriptions  Medication Sig Dispense Refill  . acetaminophen (TYLENOL) 500 MG tablet Take 500 mg by mouth every 6 (six) hours as needed for pain.     [name] acyclovir (ZOVIRAX) 800 MG tablet Take 400 mg by mouth 2 (two) times daily.     [name] albuterol (PROVENTIL,VENTOLIN) 90 MCG/ACT inhaler Inhale 2 puffs into the lungs every 4 (four) hours as needed for shortness of breath.     [name] atorvastatin (LIPITOR) 40 MG tablet Take 40 mg by mouth at bedtime.    [name] azelastine (ASTELIN) 137 MCG/SPRAY nasal spray Place 2 sprays into both nostrils daily.     . benazepril (LOTENSIN) 20 MG tablet Take 10 mg by mouth daily.     . brimonidine (ALPHAGAN P) 0.1 % SOLN Place 1 drop into both eyes 2 (two) times daily.    [name] buPROPion (WELLBUTRIN XL) 150 MG 24 hr tablet Take 150 mg by mouth daily.    . Cholecalciferol  (VITAMIN D) 2000 UNITS CAPS Take 2,000 Units by mouth daily.    . dabigatran (PRADAXA) 150 MG CAPS Take 150 mg by mouth 2 (two) times daily.     . ferrous sulfate 325 (65 FE) MG tablet Take 325 mg by mouth daily with breakfast.     . furosemide (LASIX) 20 MG tablet Take 20 mg by mouth daily.      [name] ipratropium-albuterol (DUONEB) 0.5-2.5 (3) MG/3ML SOLN Take 3 mLs by nebulization 3 (three) times daily as needed (for bronchitis or other virus that affects breathing).     . lansoprazole (PREVACID) 30 MG capsule Take 30 mg by mouth daily.      . methocarbamol (ROBAXIN) 750 MG tablet Take 750 mg by mouth 2 (two) times daily.     . metoprolol (LOPRESSOR) 50 MG tablet Take 1 tablet (50 mg total) by mouth 2 (two) times daily. 180 tablet 4  . Multiple Vitamin (MULTIVITAMIN WITH MINERALS) TABS Take 1 tablet by mouth daily.    . multivitamin-lutein (OCUVITE-LUTEIN) CAPS Take 1 capsule by mouth daily.    [name] terazosin (HYTRIN) 5 MG capsule Take 5 mg by mouth at bedtime.     . trolamine salicylate (ASPERCREME) 10 % cream Apply 1 application topically 4 (four) times daily as needed (pain).      No current facility-administered medications for this visit.     Past Medical History  Diagnosis Date  . History of peptic ulcer disease   . Anemia   . Hyperlipidemia   . Aortic stenosis    .  Peripheral vascular disease   . Atrial fibrillation   . Carotid stenosis   . Renal artery stenosis   . CAD (coronary artery disease)   . Hypertension   . Myocardial infarction     X2  . COPD (chronic obstructive pulmonary disease)   . Pneumonia     HX OF  . Sleep apnea     OXYGEN AT NIGHT 2L [place]  . Diabetes mellitus without complication     FASTING 98-120S  . GERD (gastroesophageal reflux disease)   . History of blood transfusion   . Depression   . Anxiety   . Acute myocardial infarction, unspecified site, initial episode of care   . CHF (congestive heart failure)     Past Surgical History  Procedure  Laterality Date  . Coronary artery bypass graft  1994  . Renal artery stent      stenting of the left renal artery as well as a cutting balloon angioplasty for treatment of in-stent restenosis coronary artery bypass grafting in 1994.  [name] Knee surgery  08/2003    left knee/ARTHROSCOPIC  . Removal of bleeding ulcer  1994  . Endarterectomy Right 01/05/2013    Procedure: ENDARTERECTOMY CAROTID;  Surgeon: Rosetta Posner, MD;  Location: [facility];  Service: Vascular;  Laterality: Right;  . Patch angioplasty Right 01/05/2013    Procedure: PATCH ANGIOPLASTY;  Surgeon: Rosetta Posner, MD;  Location: [facility];  Service: Vascular;  Laterality: Right;  . Carotid endarterectomy    . Left heart catheterization with coronary/graft angiogram N/A 09/01/2013    Procedure: LEFT HEART CATHETERIZATION WITH Beatrix Fetters;  Surgeon: Sinclair Grooms, MD;  Location: [facility] CATH LAB;  Service: Cardiovascular;  Laterality: N/A;  . Percutaneous coronary stent intervention (pci-s)  09/01/2013    Procedure: PERCUTANEOUS CORONARY STENT INTERVENTION (PCI-S);  Surgeon: Sinclair Grooms, MD;  Location: [facility] CATH LAB;  Service: Cardiovascular;;    History   Social History  . Marital Status: Married    Spouse Name: N/A    Number of Children: N/A  . Years of Education: N/A   Occupational History  . Not on file.   Social History Main Topics  . Smoking status: Former Smoker    Quit date: 04/27/1993  . Smokeless tobacco: Never Used  . Alcohol Use: No  . Drug Use: No  . Sexual Activity: Yes   Other Topics Concern  . Not on file   Social History Narrative   Social History:   Married    Tobacco Use - No.    Alcohol Use - yes         Family History:   Mother died at age 35 of coronary disease.  Father died at 9 of coronary disease.  He has 13 siblings, almost all of whom have coronary disease and some with premature onset.    ROS: no fevers or chills, productive cough, hemoptysis, dysphasia, odynophagia, melena,  hematochezia, dysuria, hematuria, rash, seizure activity, orthopnea, PND, claudication. Remaining systems are negative.  Physical Exam: Well-developed well-nourished in no acute distress.  Skin is warm and dry.  HEENT is normal.  Neck is supple.  Chest is clear to auscultation with normal expansion.  Cardiovascular exam is irregular, 3/6 systolic murmur Abdominal exam nontender or distended. No masses palpated. Extremities show trace edema. neuro grossly intact  ECG atrial fibrillation at a rate of 66. Inferior infarct. Not rule out anterior infarct.

## 2014-06-17 ENCOUNTER — Ambulatory Visit (INDEPENDENT_AMBULATORY_CARE_PROVIDER_SITE_OTHER): Payer: Medicare Other | Admitting: Cardiology

## 2014-06-17 ENCOUNTER — Encounter: Payer: Self-pay | Admitting: *Deleted

## 2014-06-17 ENCOUNTER — Encounter: Payer: Self-pay | Admitting: Cardiology

## 2014-06-17 DIAGNOSIS — I6523 Occlusion and stenosis of bilateral carotid arteries: Secondary | ICD-10-CM

## 2014-06-17 DIAGNOSIS — I4891 Unspecified atrial fibrillation: Secondary | ICD-10-CM | POA: Diagnosis not present

## 2014-06-17 DIAGNOSIS — I701 Atherosclerosis of renal artery: Secondary | ICD-10-CM

## 2014-06-17 DIAGNOSIS — I10 Essential (primary) hypertension: Secondary | ICD-10-CM | POA: Diagnosis not present

## 2014-06-17 DIAGNOSIS — I359 Nonrheumatic aortic valve disorder, unspecified: Secondary | ICD-10-CM

## 2014-06-17 NOTE — Assessment & Plan Note (Signed)
Repeat echocardiogram in March. I've explained he will most likely require aortic valve replacement in the future. This would most likely be via TAVR if he is a candidate.

## 2014-06-17 NOTE — Assessment & Plan Note (Signed)
Blood pressure controlled. Continue present medications. 

## 2014-06-17 NOTE — Patient Instructions (Signed)
Your physician wants you to follow-up in: [place] will receive a reminder letter in the mail two months in advance. If you don't receive a letter, please call our office to schedule the follow-up appointment.   Your physician has requested that you have an echocardiogram. Echocardiography is a painless test that uses sound waves to create images of your heart. It provides your doctor with information about the size and shape of your heart and how well your heart's chambers and valves are working. This procedure takes approximately one hour. There are no restrictions for this procedure.SCHEDULE IN [facility]  Your physician has requested that you have a renal artery duplex. During this test, an ultrasound is used to evaluate blood flow to the kidneys. Allow one hour for this exam. Do not eat after midnight the day before and avoid carbonated beverages. Take your medications as you usually do.SCHEDULE TO [facility]

## 2014-06-17 NOTE — Assessment & Plan Note (Signed)
Repeat renal Dopplers.

## 2014-06-17 NOTE — Assessment & Plan Note (Signed)
Continue beta blocker. Continue pradaxa.

## 2014-06-17 NOTE — Assessment & Plan Note (Signed)
Continue statin. 

## 2014-06-17 NOTE — Assessment & Plan Note (Signed)
Continue statin. Followed by vascular surgery. 

## 2014-06-17 NOTE — Assessment & Plan Note (Signed)
Continue statin. Not on aspirin given need for anticoagulation. 

## 2014-07-05 DIAGNOSIS — H25811 Combined forms of age-related cataract, right eye: Secondary | ICD-10-CM | POA: Diagnosis not present

## 2014-07-05 DIAGNOSIS — H2511 Age-related nuclear cataract, right eye: Secondary | ICD-10-CM | POA: Diagnosis not present

## 2014-07-06 DIAGNOSIS — H2512 Age-related nuclear cataract, left eye: Secondary | ICD-10-CM | POA: Diagnosis not present

## 2014-07-07 DIAGNOSIS — Z6833 Body mass index (BMI) 33.0-33.9, adult: Secondary | ICD-10-CM | POA: Diagnosis not present

## 2014-07-07 DIAGNOSIS — H6011 Cellulitis of right external ear: Secondary | ICD-10-CM | POA: Diagnosis not present

## 2014-07-20 DIAGNOSIS — J019 Acute sinusitis, unspecified: Secondary | ICD-10-CM | POA: Diagnosis not present

## 2014-07-23 DIAGNOSIS — E785 Hyperlipidemia, unspecified: Secondary | ICD-10-CM | POA: Diagnosis not present

## 2014-07-23 DIAGNOSIS — M109 Gout, unspecified: Secondary | ICD-10-CM | POA: Diagnosis not present

## 2014-07-23 DIAGNOSIS — I2510 Atherosclerotic heart disease of native coronary artery without angina pectoris: Secondary | ICD-10-CM | POA: Diagnosis not present

## 2014-07-23 DIAGNOSIS — E1142 Type 2 diabetes mellitus with diabetic polyneuropathy: Secondary | ICD-10-CM | POA: Diagnosis not present

## 2014-07-23 DIAGNOSIS — E611 Iron deficiency: Secondary | ICD-10-CM | POA: Diagnosis not present

## 2014-07-23 DIAGNOSIS — I10 Essential (primary) hypertension: Secondary | ICD-10-CM | POA: Diagnosis not present

## 2014-07-23 DIAGNOSIS — Z6832 Body mass index (BMI) 32.0-32.9, adult: Secondary | ICD-10-CM | POA: Diagnosis not present

## 2014-07-23 DIAGNOSIS — I4891 Unspecified atrial fibrillation: Secondary | ICD-10-CM | POA: Diagnosis not present

## 2014-07-26 DIAGNOSIS — H2512 Age-related nuclear cataract, left eye: Secondary | ICD-10-CM | POA: Diagnosis not present

## 2014-07-26 DIAGNOSIS — H25812 Combined forms of age-related cataract, left eye: Secondary | ICD-10-CM | POA: Diagnosis not present

## 2014-07-27 ENCOUNTER — Encounter: Payer: Self-pay | Admitting: Cardiology

## 2014-08-12 ENCOUNTER — Ambulatory Visit (HOSPITAL_COMMUNITY)
Admission: RE | Admit: 2014-08-12 | Discharge: 2014-08-12 | Disposition: A | Discharge status: Home / Self Care | Payer: Medicare Other | Source: Ambulatory Visit | Attending: Cardiology | Admitting: Cardiology

## 2014-08-12 ENCOUNTER — Ambulatory Visit (HOSPITAL_BASED_OUTPATIENT_CLINIC_OR_DEPARTMENT_OTHER)
Admission: RE | Admit: 2014-08-12 | Discharge: 2014-08-12 | Disposition: A | Discharge status: Home / Self Care | Payer: Medicare Other | Source: Ambulatory Visit | Attending: Cardiology | Admitting: Cardiology

## 2014-08-12 DIAGNOSIS — I10 Essential (primary) hypertension: Secondary | ICD-10-CM | POA: Insufficient documentation

## 2014-08-12 DIAGNOSIS — I359 Nonrheumatic aortic valve disorder, unspecified: Secondary | ICD-10-CM | POA: Diagnosis not present

## 2014-08-12 DIAGNOSIS — I701 Atherosclerosis of renal artery: Secondary | ICD-10-CM

## 2014-08-12 NOTE — Progress Notes (Signed)
Renal Duplex Completed. Stable bilateral 1-59% stenosis. Oda Cogan, BS, RDMS, RVT

## 2014-08-12 NOTE — Progress Notes (Signed)
2D Echo Performed 08/12/2014    Marygrace Drought, RCS

## 2014-10-01 ENCOUNTER — Encounter: Payer: Self-pay | Admitting: Cardiology

## 2014-10-27 DIAGNOSIS — M109 Gout, unspecified: Secondary | ICD-10-CM | POA: Diagnosis not present

## 2014-10-27 DIAGNOSIS — Z1389 Encounter for screening for other disorder: Secondary | ICD-10-CM | POA: Diagnosis not present

## 2014-10-27 DIAGNOSIS — I4891 Unspecified atrial fibrillation: Secondary | ICD-10-CM | POA: Diagnosis not present

## 2014-10-27 DIAGNOSIS — I2510 Atherosclerotic heart disease of native coronary artery without angina pectoris: Secondary | ICD-10-CM | POA: Diagnosis not present

## 2014-10-27 DIAGNOSIS — Z760 Encounter for issue of repeat prescription: Secondary | ICD-10-CM | POA: Diagnosis not present

## 2014-10-27 DIAGNOSIS — Z9181 History of falling: Secondary | ICD-10-CM | POA: Diagnosis not present

## 2014-10-27 DIAGNOSIS — E1142 Type 2 diabetes mellitus with diabetic polyneuropathy: Secondary | ICD-10-CM | POA: Diagnosis not present

## 2014-10-27 DIAGNOSIS — E611 Iron deficiency: Secondary | ICD-10-CM | POA: Diagnosis not present

## 2014-10-27 DIAGNOSIS — Z6832 Body mass index (BMI) 32.0-32.9, adult: Secondary | ICD-10-CM | POA: Diagnosis not present

## 2014-10-27 DIAGNOSIS — E785 Hyperlipidemia, unspecified: Secondary | ICD-10-CM | POA: Diagnosis not present

## 2014-10-27 DIAGNOSIS — I10 Essential (primary) hypertension: Secondary | ICD-10-CM | POA: Diagnosis not present

## 2014-10-28 DIAGNOSIS — L700 Acne vulgaris: Secondary | ICD-10-CM | POA: Diagnosis not present

## 2014-10-28 DIAGNOSIS — L299 Pruritus, unspecified: Secondary | ICD-10-CM | POA: Diagnosis not present

## 2014-10-28 DIAGNOSIS — L219 Seborrheic dermatitis, unspecified: Secondary | ICD-10-CM | POA: Diagnosis not present

## 2014-11-02 ENCOUNTER — Encounter: Payer: Self-pay | Admitting: Cardiology

## 2014-12-05 NOTE — Progress Notes (Signed)
HPI: FU coronary artery disease, status post coronary artery bypassing graft, permanent atrial fibrillation, AS and peripheral vascular disease (renal artery stenosis and cerebrovascular disease). Pt had right carotid endarterectomy in August of 2014. Now followed by vascular surgery. Cath 3/15 showed EF 45, normal LM, 100 LAD, Lcx and RCA; sequential SVG to OM2 and OM3 patent, LIMA to LAD patent. Patient had PTCA of distal LAD via LIMA graft. Renal Dopplers were performed in March 2016 and revealed 1-59% bilateral stenosis. Echocardiogram in March 2016 showed normal LV function, moderate aortic stenosis (mean gradient 30), mild aortic insufficiency, biatrial enlargement.Since last seen he denies dyspnea, chest pain, palpitations, syncope or bleeding.  Current Outpatient Prescriptions  Medication Sig Dispense Refill  . acetaminophen (TYLENOL) 500 MG tablet Take 500 mg by mouth every 6 (six) hours as needed for pain.     [name] acyclovir (ZOVIRAX) 800 MG tablet Take 400 mg by mouth 2 (two) times daily.     [name] albuterol (PROVENTIL,VENTOLIN) 90 MCG/ACT inhaler Inhale 2 puffs into the lungs every 4 (four) hours as needed for shortness of breath.     [name] atorvastatin (LIPITOR) 40 MG tablet Take 40 mg by mouth at bedtime.    [name] azelastine (ASTELIN) 137 MCG/SPRAY nasal spray Place 2 sprays into both nostrils daily.     . benazepril (LOTENSIN) 20 MG tablet Take 10 mg by mouth daily.     . brimonidine (ALPHAGAN P) 0.1 % SOLN Place 1 drop into both eyes 2 (two) times daily.    [name] buPROPion (WELLBUTRIN XL) 150 MG 24 hr tablet Take 150 mg by mouth daily.    . Cholecalciferol (VITAMIN D) 2000 UNITS CAPS Take 2,000 Units by mouth daily.    . dabigatran (PRADAXA) 150 MG CAPS Take 150 mg by mouth 2 (two) times daily.     . ferrous sulfate 325 (65 FE) MG tablet Take 325 mg by mouth daily with breakfast.     . furosemide (LASIX) 20 MG tablet Take 20 mg by mouth daily.      [name] ipratropium-albuterol (DUONEB) 0.5-2.5 (3)  MG/3ML SOLN Take 3 mLs by nebulization 3 (three) times daily as needed (for bronchitis or other virus that affects breathing).     . lansoprazole (PREVACID) 30 MG capsule Take 30 mg by mouth daily.      . methocarbamol (ROBAXIN) 750 MG tablet Take 750 mg by mouth 2 (two) times daily.     . metoprolol (LOPRESSOR) 50 MG tablet Take 1 tablet (50 mg total) by mouth 2 (two) times daily. 180 tablet 4  . Multiple Vitamin (MULTIVITAMIN WITH MINERALS) TABS Take 1 tablet by mouth daily.    . multivitamin-lutein (OCUVITE-LUTEIN) CAPS Take 1 capsule by mouth daily.    [name] terazosin (HYTRIN) 5 MG capsule Take 5 mg by mouth at bedtime.     . trolamine salicylate (ASPERCREME) 10 % cream Apply 1 application topically 4 (four) times daily as needed (pain).      No current facility-administered medications for this visit.     Past Medical History  Diagnosis Date  . History of peptic ulcer disease   . Anemia   . Hyperlipidemia   . Aortic stenosis    . Peripheral vascular disease   . Atrial fibrillation   . Carotid stenosis   . Renal artery stenosis   . CAD (coronary artery disease)   . Hypertension   . Myocardial infarction     X2  . COPD (chronic obstructive pulmonary  disease)   . Pneumonia     HX OF  . Sleep apnea     OXYGEN AT NIGHT 2L [place]  . Diabetes mellitus without complication     FASTING 98-120S  . GERD (gastroesophageal reflux disease)   . History of blood transfusion   . Depression   . Anxiety   . Acute myocardial infarction, unspecified site, initial episode of care   . CHF (congestive heart failure)     Past Surgical History  Procedure Laterality Date  . Coronary artery bypass graft  1994  . Renal artery stent      stenting of the left renal artery as well as a cutting balloon angioplasty for treatment of in-stent restenosis coronary artery bypass grafting in 1994.  [name] Knee surgery  08/2003    left knee/ARTHROSCOPIC  . Removal of bleeding ulcer  1994  . Endarterectomy Right  01/05/2013    Procedure: ENDARTERECTOMY CAROTID;  Surgeon: Rosetta Posner, MD;  Location: [facility];  Service: Vascular;  Laterality: Right;  . Patch angioplasty Right 01/05/2013    Procedure: PATCH ANGIOPLASTY;  Surgeon: Rosetta Posner, MD;  Location: [facility];  Service: Vascular;  Laterality: Right;  . Carotid endarterectomy    . Left heart catheterization with coronary/graft angiogram N/A 09/01/2013    Procedure: LEFT HEART CATHETERIZATION WITH Beatrix Fetters;  Surgeon: Sinclair Grooms, MD;  Location: [facility] CATH LAB;  Service: Cardiovascular;  Laterality: N/A;  . Percutaneous coronary stent intervention (pci-s)  09/01/2013    Procedure: PERCUTANEOUS CORONARY STENT INTERVENTION (PCI-S);  Surgeon: Sinclair Grooms, MD;  Location: [facility] CATH LAB;  Service: Cardiovascular;;    History   Social History  . Marital Status: Married    Spouse Name: N/A  . Number of Children: N/A  . Years of Education: N/A   Occupational History  . Not on file.   Social History Main Topics  . Smoking status: Former Smoker    Quit date: 04/27/1993  . Smokeless tobacco: Never Used  . Alcohol Use: No  . Drug Use: No  . Sexual Activity: Yes   Other Topics Concern  . Not on file   Social History Narrative   Social History:   Married    Tobacco Use - No.    Alcohol Use - yes         Family History:   Mother died at age 55 of coronary disease.  Father died at 26 of coronary disease.  He has 13 siblings, almost all of whom have coronary disease and some with premature onset.    ROS: no fevers or chills, productive cough, hemoptysis, dysphasia, odynophagia, melena, hematochezia, dysuria, hematuria, rash, seizure activity, orthopnea, PND, pedal edema, claudication. Remaining systems are negative.  Physical Exam: Well-developed well-nourished in no acute distress.  Skin is warm and dry.  HEENT is normal.  Neck is supple.  Chest is clear to auscultation with normal expansion.  Cardiovascular exam is irregular,  3/6 systolic murmur LSB Abdominal exam nontender or distended. No masses palpated. Extremities show trace edema. neuro grossly intact  ECG atrial fibrillation at a rate of 75. Cannot rule out prior inferior infarct. Anterior infarct. Lateral T-wave inversion. Left ventricular hypertrophy.

## 2014-12-07 ENCOUNTER — Encounter: Payer: Self-pay | Admitting: Cardiology

## 2014-12-07 ENCOUNTER — Ambulatory Visit (INDEPENDENT_AMBULATORY_CARE_PROVIDER_SITE_OTHER): Payer: Medicare Other | Admitting: Cardiology

## 2014-12-07 VITALS — BP 150/78 | HR 75 | Ht 72.0 in | Wt 241.0 lb

## 2014-12-07 DIAGNOSIS — I350 Nonrheumatic aortic (valve) stenosis: Secondary | ICD-10-CM

## 2014-12-07 DIAGNOSIS — I482 Chronic atrial fibrillation, unspecified: Secondary | ICD-10-CM

## 2014-12-07 DIAGNOSIS — I2510 Atherosclerotic heart disease of native coronary artery without angina pectoris: Secondary | ICD-10-CM

## 2014-12-07 DIAGNOSIS — I10 Essential (primary) hypertension: Secondary | ICD-10-CM | POA: Diagnosis not present

## 2014-12-07 DIAGNOSIS — I701 Atherosclerosis of renal artery: Secondary | ICD-10-CM | POA: Diagnosis not present

## 2014-12-07 DIAGNOSIS — I2583 Coronary atherosclerosis due to lipid rich plaque: Principal | ICD-10-CM

## 2014-12-07 MED ORDER — ATORVASTATIN CALCIUM 80 MG PO TABS: 80.0000 mg | ORAL_TABLET | Freq: Every day | ORAL | Status: AC

## 2014-12-07 NOTE — Assessment & Plan Note (Signed)
Continue statin. Followed by vascular surgery. 

## 2014-12-07 NOTE — Assessment & Plan Note (Signed)
Continue statin. 

## 2014-12-07 NOTE — Assessment & Plan Note (Signed)
Increase Lipitor to 80 mg daily. Check lipids and liver in 4 weeks.

## 2014-12-07 NOTE — Assessment & Plan Note (Signed)
Plan follow-up echocardiogram March 2017. He will likely require TAVR in the future.

## 2014-12-07 NOTE — Patient Instructions (Signed)
Your physician wants you to follow-up in: [place] will receive a reminder letter in the mail two months in advance. If you don't receive a letter, please call our office to schedule the follow-up appointment.   INCREASE ATORVASTATIN TO 80 MG ONCE DAILY= 2 OF THE 40 MG TABLETS ONCE DAILY

## 2014-12-07 NOTE — Assessment & Plan Note (Signed)
Continue present blood pressure medications. 

## 2014-12-07 NOTE — Assessment & Plan Note (Signed)
Continue beta blocker and pradaxa; recent labs show normal Hgb and GFR > 50.

## 2014-12-07 NOTE — Assessment & Plan Note (Signed)
Continue statin. Not on aspirin given need for anticoagulation. 

## 2014-12-15 DIAGNOSIS — L219 Seborrheic dermatitis, unspecified: Secondary | ICD-10-CM | POA: Diagnosis not present

## 2014-12-15 DIAGNOSIS — L700 Acne vulgaris: Secondary | ICD-10-CM | POA: Diagnosis not present

## 2015-01-20 DIAGNOSIS — L299 Pruritus, unspecified: Secondary | ICD-10-CM | POA: Diagnosis not present

## 2015-01-20 DIAGNOSIS — L219 Seborrheic dermatitis, unspecified: Secondary | ICD-10-CM | POA: Diagnosis not present

## 2015-02-10 DIAGNOSIS — E785 Hyperlipidemia, unspecified: Secondary | ICD-10-CM | POA: Diagnosis not present

## 2015-02-10 DIAGNOSIS — Z1389 Encounter for screening for other disorder: Secondary | ICD-10-CM | POA: Diagnosis not present

## 2015-02-10 DIAGNOSIS — J309 Allergic rhinitis, unspecified: Secondary | ICD-10-CM | POA: Diagnosis not present

## 2015-02-10 DIAGNOSIS — D509 Iron deficiency anemia, unspecified: Secondary | ICD-10-CM | POA: Diagnosis not present

## 2015-02-10 DIAGNOSIS — M109 Gout, unspecified: Secondary | ICD-10-CM | POA: Diagnosis not present

## 2015-02-10 DIAGNOSIS — I10 Essential (primary) hypertension: Secondary | ICD-10-CM | POA: Diagnosis not present

## 2015-02-10 DIAGNOSIS — I2510 Atherosclerotic heart disease of native coronary artery without angina pectoris: Secondary | ICD-10-CM | POA: Diagnosis not present

## 2015-02-10 DIAGNOSIS — Z6832 Body mass index (BMI) 32.0-32.9, adult: Secondary | ICD-10-CM | POA: Diagnosis not present

## 2015-02-10 DIAGNOSIS — E1142 Type 2 diabetes mellitus with diabetic polyneuropathy: Secondary | ICD-10-CM | POA: Diagnosis not present

## 2015-02-10 DIAGNOSIS — I4891 Unspecified atrial fibrillation: Secondary | ICD-10-CM | POA: Diagnosis not present

## 2015-02-14 ENCOUNTER — Encounter: Payer: Self-pay | Admitting: Cardiology

## 2015-03-15 DIAGNOSIS — H348111 Central retinal vein occlusion, right eye, with retinal neovascularization: Secondary | ICD-10-CM | POA: Diagnosis not present

## 2015-03-17 ENCOUNTER — Encounter: Payer: Self-pay | Admitting: Family

## 2015-03-21 ENCOUNTER — Ambulatory Visit (HOSPITAL_COMMUNITY)
Admission: RE | Admit: 2015-03-21 | Discharge: 2015-03-21 | Disposition: A | Discharge status: Home / Self Care | Payer: Medicare Other | Source: Ambulatory Visit | Attending: Family | Admitting: Family

## 2015-03-21 ENCOUNTER — Ambulatory Visit (INDEPENDENT_AMBULATORY_CARE_PROVIDER_SITE_OTHER): Payer: Medicare Other | Admitting: Family

## 2015-03-21 ENCOUNTER — Encounter: Payer: Self-pay | Admitting: Family

## 2015-03-21 VITALS — BP 118/60 | HR 64 | Temp 97.7°F | Resp 16 | Ht 72.0 in | Wt 236.0 lb

## 2015-03-21 DIAGNOSIS — E785 Hyperlipidemia, unspecified: Secondary | ICD-10-CM | POA: Diagnosis not present

## 2015-03-21 DIAGNOSIS — Z9889 Other specified postprocedural states: Secondary | ICD-10-CM | POA: Diagnosis not present

## 2015-03-21 DIAGNOSIS — I872 Venous insufficiency (chronic) (peripheral): Secondary | ICD-10-CM | POA: Diagnosis not present

## 2015-03-21 DIAGNOSIS — I6523 Occlusion and stenosis of bilateral carotid arteries: Secondary | ICD-10-CM | POA: Diagnosis not present

## 2015-03-21 DIAGNOSIS — E119 Type 2 diabetes mellitus without complications: Secondary | ICD-10-CM | POA: Diagnosis not present

## 2015-03-21 DIAGNOSIS — I10 Essential (primary) hypertension: Secondary | ICD-10-CM | POA: Insufficient documentation

## 2015-03-21 DIAGNOSIS — I868 Varicose veins of other specified sites: Secondary | ICD-10-CM

## 2015-03-21 DIAGNOSIS — Z48812 Encounter for surgical aftercare following surgery on the circulatory system: Secondary | ICD-10-CM

## 2015-03-21 DIAGNOSIS — I701 Atherosclerosis of renal artery: Secondary | ICD-10-CM | POA: Diagnosis not present

## 2015-03-21 NOTE — Patient Instructions (Addendum)
Stroke Prevention Some medical conditions and behaviors are associated with an increased chance of having a stroke. You may prevent a stroke by making healthy choices and managing medical conditions. HOW CAN I REDUCE MY RISK OF HAVING A STROKE?   Stay physically active. Get at least 30 minutes of activity on most or all days.  Do not smoke. It may also be helpful to avoid exposure to secondhand smoke.  Limit alcohol use. Moderate alcohol use is considered to be:  No more than 2 drinks per day for men.  No more than 1 drink per day for nonpregnant women.  Eat healthy foods. This involves:  Eating 5 or more servings of fruits and vegetables a day.  Making dietary changes that address high blood pressure (hypertension), high cholesterol, diabetes, or obesity.  Manage your cholesterol levels.  Making food choices that are high in fiber and low in saturated fat, trans fat, and cholesterol may control cholesterol levels.  Take any prescribed medicines to control cholesterol as directed by your health care provider.  Manage your diabetes.  Controlling your carbohydrate and sugar intake is recommended to manage diabetes.  Take any prescribed medicines to control diabetes as directed by your health care provider.  Control your hypertension.  Making food choices that are low in salt (sodium), saturated fat, trans fat, and cholesterol is recommended to manage hypertension.  Ask your health care provider if you need treatment to lower your blood pressure. Take any prescribed medicines to control hypertension as directed by your health care provider.  If you are 18-39 years of age, have your blood pressure checked every 3-5 years. If you are 40 years of age or older, have your blood pressure checked every year.  Maintain a healthy weight.  Reducing calorie intake and making food choices that are low in sodium, saturated fat, trans fat, and cholesterol are recommended to manage  weight.  Stop drug abuse.  Avoid taking birth control pills.  Talk to your health care provider about the risks of taking birth control pills if you are over 35 years old, smoke, get migraines, or have ever had a blood clot.  Get evaluated for sleep disorders (sleep apnea).  Talk to your health care provider about getting a sleep evaluation if you snore a lot or have excessive sleepiness.  Take medicines only as directed by your health care provider.  For some people, aspirin or blood thinners (anticoagulants) are helpful in reducing the risk of forming abnormal blood clots that can lead to stroke. If you have the irregular heart rhythm of atrial fibrillation, you should be on a blood thinner unless there is a good reason you cannot take them.  Understand all your medicine instructions.  Make sure that other conditions (such as anemia or atherosclerosis) are addressed. SEEK IMMEDIATE MEDICAL CARE IF:   You have sudden weakness or numbness of the face, arm, or leg, especially on one side of the body.  Your face or eyelid droops to one side.  You have sudden confusion.  You have trouble speaking (aphasia) or understanding.  You have sudden trouble seeing in one or both eyes.  You have sudden trouble walking.  You have dizziness.  You have a loss of balance or coordination.  You have a sudden, severe headache with no known cause.  You have new chest pain or an irregular heartbeat. Any of these symptoms may represent a serious problem that is an emergency. Do not wait to see if the symptoms will   go away. Get medical help at once. Call your local emergency services (911 in [place] Do not drive yourself to the hospital.   This information is not intended to replace advice given to you by your health care provider. Make sure you discuss any questions you have with your health care provider.   Document Released: 06/28/2004 Document Revised: 06/11/2014 Document Reviewed:  11/21/2012 Elsevier Interactive Patient Education 2016 [employer]     Venous Stasis or Chronic Venous Insufficiency Chronic venous insufficiency, also called venous stasis, is a condition that affects the veins in the legs. The condition prevents blood from being pumped through these veins effectively. Blood may no longer be pumped effectively from the legs back to the heart. This condition can range from mild to severe. With proper treatment, you should be able to continue with an active life. CAUSES  Chronic venous insufficiency occurs when the vein walls become stretched, weakened, or damaged or when valves within the vein are damaged. Some common causes of this include:  High blood pressure inside the veins (venous hypertension).  Increased blood pressure in the leg veins from long periods of sitting or standing.  A blood clot that blocks blood flow in a vein (deep vein thrombosis).  Inflammation of a superficial vein (phlebitis) that causes a blood clot to form. RISK FACTORS Various things can make you more likely to develop chronic venous insufficiency, including:  Family history of this condition.  Obesity.  Pregnancy.  Sedentary lifestyle.  Smoking.  Jobs requiring long periods of standing or sitting in one place.  Being a certain age. Women in their 40s and 50s and men in their 70s are more likely to develop this condition. SIGNS AND SYMPTOMS  Symptoms may include:   Varicose veins.  Skin breakdown or ulcers.  Reddened or discolored skin on the leg.  Brown, smooth, tight, and painful skin just above the ankle, usually on the inside surface (lipodermatosclerosis).  Swelling. DIAGNOSIS  To diagnose this condition, your health care provider will take a medical history and do a physical exam. The following tests may be ordered to confirm the diagnosis:  Duplex ultrasound--A procedure that produces a picture of a blood vessel and nearby organs and also  provides information on blood flow through the blood vessel.  Plethysmography--A procedure that tests blood flow.  A venogram, or venography--A procedure used to look at the veins using X-ray and dye. TREATMENT The goals of treatment are to help you return to an active life and to minimize pain or disability. Treatment will depend on the severity of the condition. Medical procedures may be needed for severe cases. Treatment options may include:   Use of compression stockings. These can help with symptoms and lower the chances of the problem getting worse, but they do not cure the problem.  Sclerotherapy--A procedure involving an injection of a material that "dissolves" the damaged veins. Other veins in the network of blood vessels take over the function of the damaged veins.  Surgery to remove the vein or cut off blood flow through the vein (vein stripping or laser ablation surgery).  Surgery to repair a valve. HOME CARE INSTRUCTIONS   Wear compression stockings as directed by your health care provider.  Only take over-the-counter or prescription medicines for pain, discomfort, or fever as directed by your health care provider.  Follow up with your health care provider as directed. SEEK MEDICAL CARE IF:   You have redness, swelling, or increasing pain in the affected area.    You see a red streak or line that extends up or down from the affected area.  You have a breakdown or loss of skin in the affected area, even if the breakdown is small.  You have an injury to the affected area. SEEK IMMEDIATE MEDICAL CARE IF:   You have an injury and open wound in the affected area.  Your pain is severe and does not improve with medicine.  You have sudden numbness or weakness in the foot or ankle below the affected area, or you have trouble moving your foot or ankle.  You have a fever or persistent symptoms for more than 2-3 days.  You have a fever and your symptoms suddenly get  worse. MAKE SURE YOU:   Understand these instructions.  Will watch your condition.  Will get help right away if you are not doing well or get worse.   This information is not intended to replace advice given to you by your health care provider. Make sure you discuss any questions you have with your health care provider.   Document Released: 09/24/2006 Document Revised: 03/11/2013 Document Reviewed: 01/26/2013 Elsevier Interactive Patient Education 2016 [employer]  

## 2015-03-21 NOTE — Addendum Note (Signed)
Addended by: Dorthula Rue L on: 03/21/2015 06:16 PM   Modules accepted: Orders

## 2015-03-21 NOTE — Progress Notes (Signed)
Established Carotid Artery Disease    History of Present Illness  [name] is a 81 y.o. male patient of Dr. Donnetta Hutching who returns today for followup of his right carotid endarterectomy for severe asymptomatic disease in August of 2014.  He was hospitalized with congestive heart failure in January 2015. In March, 2015 she had a myocardial infarction and had cardiac catheterization and what sounds like angioplasty of right coronary artery. February, 2015 he had repair of perforated small intestine, does not know etiology. Patient denies any history of stroke or TIA, specifically he denies amaurosis fugax or monocular blindness, denies facial drooping, denies hemiplegia, denies receptive or expressive aphasia.  Patient denies claudication symptoms in legs with walking, denies non healing wounds. He walks his dog 4-5 x/day. He uses his stationary bike 3x/week. Pt and wife report that the swelling in his lower legs resolves with overnight elevation of his legs.   Pt denies New Medical or Surgical History: other than the above.  Pt Diabetic: Yes, diet controlled, wife states last A1C was 5.9 Pt smoker: former smoker, quit in 1994, same day he had the CABG  Pt meds include: Statin : Yes ASA: No Other anticoagulants/antiplatelets: Pradaxa for atrial fib, managed by Dr. Lelon Perla office   Past Medical History  Diagnosis Date  . History of peptic ulcer disease   . Anemia   . Hyperlipidemia   . Aortic stenosis    . Peripheral vascular disease   . Atrial fibrillation   . Carotid stenosis   . Renal artery stenosis   . CAD (coronary artery disease)   . Hypertension   . Myocardial infarction     X2  . COPD (chronic obstructive pulmonary disease)   . Pneumonia     HX OF  . Sleep apnea     OXYGEN AT NIGHT 2L [place]  . Diabetes mellitus without complication     FASTING 98-120S  . GERD (gastroesophageal reflux disease)   . History of blood transfusion   . Depression   .  Anxiety   . Acute myocardial infarction, unspecified site, initial episode of care   . CHF (congestive heart failure)     Social History Social History  Substance Use Topics  . Smoking status: Former Smoker    Quit date: 04/27/1993  . Smokeless tobacco: Never Used  . Alcohol Use: No    Family History Family History  Problem Relation Age of Onset  . Coronary artery disease Mother   . Heart disease Mother     Before age 73  . Hyperlipidemia Mother   . Hypertension Mother   . Heart attack Mother   . Coronary artery disease Father   . Heart disease Father     After age 4  . Heart attack Father   . Coronary artery disease      13 sibling, almost all have coronary disease and some with premature onset  . Heart disease      13 sibling, almost all have coronary disease and some with premature onset  . Heart disease Sister     After age 39  . Heart disease Brother     After age 42    Surgical History Past Surgical History  Procedure Laterality Date  . Coronary artery bypass graft  1994  . Renal artery stent      stenting of the left renal artery as well as a cutting balloon angioplasty for treatment of in-stent restenosis coronary artery bypass grafting in 1994.  [name]  Knee surgery  08/2003    left knee/ARTHROSCOPIC  . Removal of bleeding ulcer  1994  . Endarterectomy Right 01/05/2013    Procedure: ENDARTERECTOMY CAROTID;  Surgeon: Rosetta Posner, MD;  Location: [place];  Service: Vascular;  Laterality: Right;  . Patch angioplasty Right 01/05/2013    Procedure: PATCH ANGIOPLASTY;  Surgeon: Rosetta Posner, MD;  Location: [place];  Service: Vascular;  Laterality: Right;  . Carotid endarterectomy    . Left heart catheterization with coronary/graft angiogram N/A 09/01/2013    Procedure: LEFT HEART CATHETERIZATION WITH Beatrix Fetters;  Surgeon: Sinclair Grooms, MD;  Location: [facility] CATH LAB;  Service: Cardiovascular;  Laterality: N/A;  . Percutaneous coronary stent intervention  (pci-s)  09/01/2013    Procedure: PERCUTANEOUS CORONARY STENT INTERVENTION (PCI-S);  Surgeon: Sinclair Grooms, MD;  Location: [facility] CATH LAB;  Service: Cardiovascular;;    Allergies  Allergen Reactions  . Bactrim [Sulfamethoxazole-Trimethoprim] Other (See Comments)    "does not do anything for me"    Current Outpatient Prescriptions  Medication Sig Dispense Refill  . acetaminophen (TYLENOL) 500 MG tablet Take 500 mg by mouth every 6 (six) hours as needed for pain.     [name] acyclovir (ZOVIRAX) 800 MG tablet Take 400 mg by mouth 2 (two) times daily.     [name] albuterol (PROVENTIL,VENTOLIN) 90 MCG/ACT inhaler Inhale 2 puffs into the lungs every 4 (four) hours as needed for shortness of breath.     [name] atorvastatin (LIPITOR) 80 MG tablet Take 1 tablet (80 mg total) by mouth at bedtime. 90 tablet 3  . azelastine (ASTELIN) 137 MCG/SPRAY nasal spray Place 2 sprays into both nostrils daily.     . benazepril (LOTENSIN) 20 MG tablet Take 10 mg by mouth daily.     . brimonidine (ALPHAGAN P) 0.1 % SOLN Place 1 drop into both eyes 2 (two) times daily.    [name] buPROPion (WELLBUTRIN XL) 150 MG 24 hr tablet Take 150 mg by mouth daily.    . Cholecalciferol (VITAMIN D) 2000 UNITS CAPS Take 2,000 Units by mouth daily.    . dabigatran (PRADAXA) 150 MG CAPS Take 150 mg by mouth 2 (two) times daily.     . ferrous sulfate 325 (65 FE) MG tablet Take 325 mg by mouth daily with breakfast.     . furosemide (LASIX) 20 MG tablet Take 20 mg by mouth daily.      [name] ipratropium-albuterol (DUONEB) 0.5-2.5 (3) MG/3ML SOLN Take 3 mLs by nebulization 3 (three) times daily as needed (for bronchitis or other virus that affects breathing).     . lansoprazole (PREVACID) 30 MG capsule Take 30 mg by mouth daily.      . methocarbamol (ROBAXIN) 750 MG tablet Take 750 mg by mouth 2 (two) times daily.     . metoprolol (LOPRESSOR) 50 MG tablet Take 1 tablet (50 mg total) by mouth 2 (two) times daily. 180 tablet 4  . Multiple Vitamin (MULTIVITAMIN  WITH MINERALS) TABS Take 1 tablet by mouth daily.    . multivitamin-lutein (OCUVITE-LUTEIN) CAPS Take 1 capsule by mouth daily.    [name] terazosin (HYTRIN) 5 MG capsule Take 5 mg by mouth at bedtime.     . trolamine salicylate (ASPERCREME) 10 % cream Apply 1 application topically 4 (four) times daily as needed (pain).      No current facility-administered medications for this visit.    Review of Systems : See HPI for pertinent positives and negatives.  Physical Examination  Filed Vitals:   03/21/15 1506 03/21/15 1511  BP: 120/57 118/60  Pulse: 66 64  Temp: 97.7 F (36.5 C)   TempSrc: Oral   Resp: 16   Height: 6' (1.829 m)   Weight: 236 lb (107.049 kg)   SpO2: 98%    Body mass index is 32 kg/(m^2).   General: WDWN obese male in NAD GAIT: normal Eyes: PERRLA Pulmonary: Non-labored, CTAB, no rales, no rhonchi, & no wheezing.  Cardiac: Irregular Rhythm, feet appear well perfused. 2+ bilateral pretibial pitting edema.  VASCULAR EXAM Carotid Bruits Right Left   Negative Negative   Radial pulses are 2+ palpable and equal.      LE Pulses Right Left   POPLITEAL not palpable  not palpable   POSTERIOR TIBIAL not palpable  not palpable    DORSALIS PEDIS  ANTERIOR TIBIAL not palpable  1+ palpable     Gastrointestinal: soft, nontender, BS WNL, no r/g, no palpable masses.  Musculoskeletal: Negative muscle atrophy/wasting. M/S 5/5 throughout, Extremities without ischemic changes.  Neurologic: A&O X 3; Appropriate Affect ; SENSATION ;normal;  Speech is normal CN 2-12 intact, Pain and light touch intact in extremities, Motor exam as listed above.         Non-Invasive Vascular Imaging CAROTID DUPLEX 03/21/2015   CEREBROVASCULAR DUPLEX EVALUATION    INDICATION: Carotid artery disease     PREVIOUS  INTERVENTION(S): Right carotid endarterectomy 01/05/2013.    DUPLEX EXAM:     RIGHT  LEFT  Peak Systolic Velocities (cm/s) End Diastolic Velocities (cm/s) Plaque LOCATION Peak Systolic Velocities (cm/s) End Diastolic Velocities (cm/s) Plaque  84 9  CCA PROXIMAL 72 9   58 12  CCA MID 44 9   62 12  CCA DISTAL 44 9 HT  101 0 HM ECA 89 0 HT  57 10  ICA PROXIMAL 191 56 HT  62 16  ICA MID 156 22 HT  72 19  ICA DISTAL 71 13     NA ICA / CCA Ratio (PSV) 4.34  Antegrade  Vertebral Flow Antegrade    Brachial Systolic Pressure (mmHg)   Multiphasic (Subclavian artery) Brachial Artery Waveforms Multiphasic (Subclavian artery)    Plaque Morphology:  HM = Homogeneous, HT = Heterogeneous, CP = Calcific Plaque, SP = Smooth Plaque, IP = Irregular Plaque  ADDITIONAL FINDINGS:     IMPRESSION: Patent right carotid endarterectomy site with no evidence of hyperplasia or restenosis.  Left internal carotid artery velocities suggest a 40-59% stenosis.     Compared to the previous exam:  No significant change in comparison to the last exam on 03/15/2014.      Assessment: [name] is a 81 y.o. male who is s/p right carotid endarterectomy on 01/05/2013. He has no history of stroke or TIA. Today's carotid duplex suggests a patent right carotid endarterectomy site with no evidence of hyperplasia or restenosis.  Left internal carotid artery velocities suggest a 40-59% stenosis. No significant change in comparison to the last exam on 03/15/2014.   Chronic venous insufficiency: measured for and given a pair of 20-30 mmHg graduated knee high compression hose; also given printed information for discount outlet for compression hose in [place].  Plan: Follow-up in 1 year with Carotid Duplex scan.   I discussed in depth with the patient the nature of atherosclerosis, and emphasized the importance of maximal medical management including strict control of blood pressure, blood glucose, and lipid levels,  obtaining regular exercise, and continued cessation of smoking.  The patient is aware  that without maximal medical management the underlying atherosclerotic disease process will progress, limiting the benefit of any interventions. The patient was given information about stroke prevention and what symptoms should prompt the patient to seek immediate medical care. Thank you for allowing [place] to participate in this patient's care.  Clemon Chambers, RN, MSN, FNP-C [facility] Office: [facility] Physician: Trula Slade  03/21/2015 3:01 PM

## 2015-04-19 DIAGNOSIS — J449 Chronic obstructive pulmonary disease, unspecified: Secondary | ICD-10-CM | POA: Diagnosis not present

## 2015-04-19 DIAGNOSIS — J019 Acute sinusitis, unspecified: Secondary | ICD-10-CM | POA: Diagnosis not present

## 2015-04-19 DIAGNOSIS — Z6832 Body mass index (BMI) 32.0-32.9, adult: Secondary | ICD-10-CM | POA: Diagnosis not present

## 2015-04-26 DIAGNOSIS — L219 Seborrheic dermatitis, unspecified: Secondary | ICD-10-CM | POA: Diagnosis not present

## 2015-04-26 DIAGNOSIS — D1801 Hemangioma of skin and subcutaneous tissue: Secondary | ICD-10-CM | POA: Diagnosis not present

## 2015-04-27 DIAGNOSIS — H8113 Benign paroxysmal vertigo, bilateral: Secondary | ICD-10-CM | POA: Diagnosis not present

## 2015-04-27 DIAGNOSIS — J019 Acute sinusitis, unspecified: Secondary | ICD-10-CM | POA: Diagnosis not present

## 2015-04-27 DIAGNOSIS — Z6833 Body mass index (BMI) 33.0-33.9, adult: Secondary | ICD-10-CM | POA: Diagnosis not present

## 2015-04-27 DIAGNOSIS — H6691 Otitis media, unspecified, right ear: Secondary | ICD-10-CM | POA: Diagnosis not present

## 2015-05-17 DIAGNOSIS — Z6832 Body mass index (BMI) 32.0-32.9, adult: Secondary | ICD-10-CM | POA: Diagnosis not present

## 2015-05-17 DIAGNOSIS — M109 Gout, unspecified: Secondary | ICD-10-CM | POA: Diagnosis not present

## 2015-05-17 DIAGNOSIS — D509 Iron deficiency anemia, unspecified: Secondary | ICD-10-CM | POA: Diagnosis not present

## 2015-05-17 DIAGNOSIS — I4891 Unspecified atrial fibrillation: Secondary | ICD-10-CM | POA: Diagnosis not present

## 2015-05-17 DIAGNOSIS — I10 Essential (primary) hypertension: Secondary | ICD-10-CM | POA: Diagnosis not present

## 2015-05-17 DIAGNOSIS — E1142 Type 2 diabetes mellitus with diabetic polyneuropathy: Secondary | ICD-10-CM | POA: Diagnosis not present

## 2015-05-17 DIAGNOSIS — E669 Obesity, unspecified: Secondary | ICD-10-CM | POA: Diagnosis not present

## 2015-05-17 DIAGNOSIS — I2510 Atherosclerotic heart disease of native coronary artery without angina pectoris: Secondary | ICD-10-CM | POA: Diagnosis not present

## 2015-05-17 DIAGNOSIS — E785 Hyperlipidemia, unspecified: Secondary | ICD-10-CM | POA: Diagnosis not present

## 2015-06-13 NOTE — Progress Notes (Signed)
HPI: FU coronary artery disease, status post coronary artery bypassing graft, permanent atrial fibrillation, AS and peripheral vascular disease (renal artery stenosis and cerebrovascular disease). Pt had right carotid endarterectomy in August of 2014. Now followed by vascular surgery. Cath 3/15 showed EF 45, normal LM, 100 LAD, Lcx and RCA; sequential SVG to OM2 and OM3 patent, LIMA to LAD patent. Patient had PTCA of distal LAD via LIMA graft. Renal Dopplers were performed in March 2016 and revealed 1-59% bilateral stenosis. Echocardiogram in March 2016 showed normal LV function, moderate aortic stenosis (mean gradient 30), mild aortic insufficiency, biatrial enlargement.Since last seen   Current Outpatient Prescriptions  Medication Sig Dispense Refill  . acetaminophen (TYLENOL) 500 MG tablet Take 500 mg by mouth every 6 (six) hours as needed for pain.     [name] acyclovir (ZOVIRAX) 800 MG tablet Take 400 mg by mouth 2 (two) times daily.     [name] albuterol (PROVENTIL,VENTOLIN) 90 MCG/ACT inhaler Inhale 2 puffs into the lungs every 4 (four) hours as needed for shortness of breath.     [name] atorvastatin (LIPITOR) 80 MG tablet Take 1 tablet (80 mg total) by mouth at bedtime. 90 tablet 3  . azelastine (ASTELIN) 137 MCG/SPRAY nasal spray Place 2 sprays into both nostrils daily.     . benazepril (LOTENSIN) 20 MG tablet Take 10 mg by mouth daily.     . brimonidine (ALPHAGAN P) 0.1 % SOLN Place 1 drop into both eyes 2 (two) times daily.    [name] buPROPion (WELLBUTRIN XL) 150 MG 24 hr tablet Take 150 mg by mouth daily.    . Cholecalciferol (VITAMIN D) 2000 UNITS CAPS Take 2,000 Units by mouth daily.    . dabigatran (PRADAXA) 150 MG CAPS Take 150 mg by mouth 2 (two) times daily.     . ferrous sulfate 325 (65 FE) MG tablet Take 325 mg by mouth daily with breakfast.     . furosemide (LASIX) 20 MG tablet Take 20 mg by mouth daily.      [name] ipratropium-albuterol (DUONEB) 0.5-2.5 (3) MG/3ML SOLN Take 3 mLs by  nebulization 3 (three) times daily as needed (for bronchitis or other virus that affects breathing).     . lansoprazole (PREVACID) 30 MG capsule Take 30 mg by mouth daily.      . methocarbamol (ROBAXIN) 750 MG tablet Take 750 mg by mouth 2 (two) times daily.     . metoprolol (LOPRESSOR) 50 MG tablet Take 1 tablet (50 mg total) by mouth 2 (two) times daily. 180 tablet 4  . Multiple Vitamin (MULTIVITAMIN WITH MINERALS) TABS Take 1 tablet by mouth daily.    . multivitamin-lutein (OCUVITE-LUTEIN) CAPS Take 1 capsule by mouth daily.    [name] terazosin (HYTRIN) 5 MG capsule Take 5 mg by mouth at bedtime.     . trolamine salicylate (ASPERCREME) 10 % cream Apply 1 application topically 4 (four) times daily as needed (pain).      No current facility-administered medications for this visit.     Past Medical History  Diagnosis Date  . History of peptic ulcer disease   . Anemia   . Hyperlipidemia   . Aortic stenosis    . Peripheral vascular disease ([place])   . Atrial fibrillation ([place])   . Carotid stenosis   . Renal artery stenosis ([place])   . CAD (coronary artery disease)   . Hypertension   . Myocardial infarction ([place])     X2  . COPD (chronic obstructive pulmonary  disease) ([place])   . Pneumonia     HX OF  . Sleep apnea     OXYGEN AT NIGHT 2L [place]  . Diabetes mellitus without complication ([place])     FASTING 98-120S  . GERD (gastroesophageal reflux disease)   . History of blood transfusion   . Depression   . Anxiety   . Acute myocardial infarction, unspecified site, initial episode of care   . CHF (congestive heart failure) [facility])     Past Surgical History  Procedure Laterality Date  . Coronary artery bypass graft  1994  . Renal artery stent      stenting of the left renal artery as well as a cutting balloon angioplasty for treatment of in-stent restenosis coronary artery bypass grafting in 1994.  [name] Knee surgery  08/2003    left knee/ARTHROSCOPIC  . Removal of bleeding ulcer  1994  .  Endarterectomy Right 01/05/2013    Procedure: ENDARTERECTOMY CAROTID;  Surgeon: Rosetta Posner, MD;  Location: [place];  Service: Vascular;  Laterality: Right;  . Patch angioplasty Right 01/05/2013    Procedure: PATCH ANGIOPLASTY;  Surgeon: Rosetta Posner, MD;  Location: [place];  Service: Vascular;  Laterality: Right;  . Carotid endarterectomy    . Left heart catheterization with coronary/graft angiogram N/A 09/01/2013    Procedure: LEFT HEART CATHETERIZATION WITH Beatrix Fetters;  Surgeon: Sinclair Grooms, MD;  Location[facility] C[facility] LAB;  Service: Cardiovascular;  Laterality: N/A;  . Percutaneous coronary stent intervention (pci-s)  09/01/2013    Procedure: PERCUTANEOUS CORONARY STENT INTERVENTION (PCI-S);  Surgeon: Sinclair Grooms, MD;  Location: [facility] CATH LAB;  Service: Cardiovascular;;    Social History   Social History  . Marital Status: Married    Spouse Name: N/A  . Number of Children: N/A  . Years of Education: N/A   Occupational History  . Not on file.   Social History Main Topics  . Smoking status: Former Smoker    Quit date: 04/27/1993  . Smokeless tobacco: Never Used  . Alcohol Use: No  . Drug Use: No  . Sexual Activity: Yes   Other Topics Concern  . Not on file   Social History Narrative   Social History:   Married    Tobacco Use - No.    Alcohol Use - yes         Family History:   Mother died at age 3 of coronary disease.  Father died at 41 of coronary disease.  He has 13 siblings, almost all of whom have coronary disease and some with premature onset.    Family History  Problem Relation Age of Onset  . Coronary artery disease Mother   . Heart disease Mother     Before age 85  . Hyperlipidemia Mother   . Hypertension Mother   . Heart attack Mother   . Coronary artery disease Father   . Heart disease Father     After age 12  . Heart attack Father   . Coronary artery disease      13 sibling, almost all have coronary disease and some with premature onset   . Heart disease      13 sibling, almost all have coronary disease and some with premature onset  . Heart disease Sister     After age 27  . Heart disease Brother     After age 11    ROS: no fevers or chills, productive cough, hemoptysis, dysphasia, odynophagia, melena, hematochezia, dysuria,  hematuria, rash, seizure activity, orthopnea, PND, pedal edema, claudication. Remaining systems are negative.  Physical Exam: Well-developed well-nourished in no acute distress.  Skin is warm and dry.  HEENT is normal.  Neck is supple.  Chest is clear to auscultation with normal expansion.  Cardiovascular exam is regular rate and rhythm.  Abdominal exam nontender or distended. No masses palpated. Extremities show no edema. neuro grossly intact  ECG     This encounter was created in error - please disregard.

## 2015-06-17 ENCOUNTER — Encounter: Payer: Medicare Other | Admitting: Cardiology

## 2015-06-20 NOTE — Progress Notes (Signed)
HPI: FU coronary artery disease, status post coronary artery bypassing graft, permanent atrial fibrillation, AS and peripheral vascular disease (renal artery stenosis and cerebrovascular disease). Pt had right carotid endarterectomy in August of 2014. Now followed by vascular surgery. Cath 3/15 showed EF 45, normal LM, 100 LAD, Lcx and RCA; sequential SVG to OM2 and OM3 patent, LIMA to LAD patent. Patient had PTCA of distal LAD via LIMA graft. Renal Dopplers were performed in March 2016 and revealed 1-59% bilateral stenosis. Echocardiogram in March 2016 showed normal LV function, moderate aortic stenosis (mean gradient 30), mild aortic insufficiency, biatrial enlargement.Since last seen the patient has dyspnea with more extreme activities but not with routine activities. It is relieved with rest. It is not associated with chest pain. There is no orthopnea, PND or pedal edema. There is no syncope or palpitations. There is no exertional chest pain.   Current Outpatient Prescriptions  Medication Sig Dispense Refill  . acetaminophen (TYLENOL) 500 MG tablet Take 500 mg by mouth every 6 (six) hours as needed for pain.     [name] acyclovir (ZOVIRAX) 800 MG tablet Take 400 mg by mouth 2 (two) times daily.     [name] albuterol (PROVENTIL,VENTOLIN) 90 MCG/ACT inhaler Inhale 2 puffs into the lungs every 4 (four) hours as needed for shortness of breath.     [name] atorvastatin (LIPITOR) 80 MG tablet Take 1 tablet (80 mg total) by mouth at bedtime. 90 tablet 3  . azelastine (ASTELIN) 137 MCG/SPRAY nasal spray Place 2 sprays into both nostrils daily.     . benazepril (LOTENSIN) 20 MG tablet Take 10 mg by mouth daily.     . brimonidine (ALPHAGAN P) 0.1 % SOLN Place 1 drop into both eyes 2 (two) times daily.    [name] buPROPion (WELLBUTRIN XL) 150 MG 24 hr tablet Take 150 mg by mouth daily.    . Cholecalciferol (VITAMIN D) 2000 UNITS CAPS Take 2,000 Units by mouth daily.    . dabigatran (PRADAXA) 150 MG CAPS Take 150 mg by  mouth 2 (two) times daily.     . ferrous sulfate 325 (65 FE) MG tablet Take 325 mg by mouth daily with breakfast.     . furosemide (LASIX) 20 MG tablet Take 20 mg by mouth daily.      [name] ipratropium-albuterol (DUONEB) 0.5-2.5 (3) MG/3ML SOLN Take 3 mLs by nebulization 3 (three) times daily as needed (for bronchitis or other virus that affects breathing).     . lansoprazole (PREVACID) 30 MG capsule Take 30 mg by mouth daily.      . methocarbamol (ROBAXIN) 750 MG tablet Take 750 mg by mouth 2 (two) times daily.     . metoprolol (LOPRESSOR) 50 MG tablet Take 1 tablet (50 mg total) by mouth 2 (two) times daily. 180 tablet 4  . Multiple Vitamin (MULTIVITAMIN WITH MINERALS) TABS Take 1 tablet by mouth daily.    . multivitamin-lutein (OCUVITE-LUTEIN) CAPS Take 1 capsule by mouth daily.    [name] terazosin (HYTRIN) 5 MG capsule Take 5 mg by mouth at bedtime.     . trolamine salicylate (ASPERCREME) 10 % cream Apply 1 application topically 4 (four) times daily as needed (pain).      No current facility-administered medications for this visit.     Past Medical History  Diagnosis Date  . History of peptic ulcer disease   . Anemia   . Hyperlipidemia   . Aortic stenosis    . Peripheral vascular disease ([place])   .  Atrial fibrillation ([place])   . Carotid stenosis   . Renal artery stenosis ([place])   . CAD (coronary artery disease)   . Hypertension   . Myocardial infarction ([place])     X2  . COPD (chronic obstructive pulmonary disease) ([place])   . Pneumonia     HX OF  . Sleep apnea     OXYGEN AT NIGHT 2L [place]  . Diabetes mellitus without complication ([place])     FASTING 98-120S  . GERD (gastroesophageal reflux disease)   . History of blood transfusion   . Depression   . Anxiety   . Acute myocardial infarction, unspecified site, initial episode of care   . CHF (congestive heart failure) [facility])     Past Surgical History  Procedure Laterality Date  . Coronary artery bypass graft  1994  . Renal artery stent        stenting of the left renal artery as well as a cutting balloon angioplasty for treatment of in-stent restenosis coronary artery bypass grafting in 1994.  [name] Knee surgery  08/2003    left knee/ARTHROSCOPIC  . Removal of bleeding ulcer  1994  . Endarterectomy Right 01/05/2013    Procedure: ENDARTERECTOMY CAROTID;  Surgeon: Rosetta Posner, MD;  Location: [place];  Service: Vascular;  Laterality: Right;  . Patch angioplasty Right 01/05/2013    Procedure: PATCH ANGIOPLASTY;  Surgeon: Rosetta Posner, MD;  Location: [place];  Service: Vascular;  Laterality: Right;  . Carotid endarterectomy    . Left heart catheterization with coronary/graft angiogram N/A 09/01/2013    Procedure: LEFT HEART CATHETERIZATION WITH Beatrix Fetters;  Surgeon: Sinclair Grooms, MD;  Location: [facility] CATH LAB;  Service: Cardiovascular;  Laterality: N/A;  . Percutaneous coronary stent intervention (pci-s)  09/01/2013    Procedure: PERCUTANEOUS CORONARY STENT INTERVENTION (PCI-S);  Surgeon: Sinclair Grooms, MD;  Location: [facility] CATH LAB;  Service: Cardiovascular;;    Social History   Social History  . Marital Status: Married    Spouse Name: N/A  . Number of Children: N/A  . Years of Education: N/A   Occupational History  . Not on file.   Social History Main Topics  . Smoking status: Former Smoker    Quit date: 04/27/1993  . Smokeless tobacco: Never Used  . Alcohol Use: No  . Drug Use: No  . Sexual Activity: Yes   Other Topics Concern  . Not on file   Social History Narrative   Social History:   Married    Tobacco Use - No.    Alcohol Use - yes         Family History:   Mother died at age 26 of coronary disease.  Father died at 68 of coronary disease.  He has 13 siblings, almost all of whom have coronary disease and some with premature onset.    Family History  Problem Relation Age of Onset  . Coronary artery disease Mother   . Heart disease Mother     Before age 49  . Hyperlipidemia Mother   .  Hypertension Mother   . Heart attack Mother   . Coronary artery disease Father   . Heart disease Father     After age 66  . Heart attack Father   . Coronary artery disease      13 sibling, almost all have coronary disease and some with premature onset  . Heart disease      13 sibling, almost all have coronary disease and  some with premature onset  . Heart disease Sister     After age 26  . Heart disease Brother     After age 41    ROS: no fevers or chills, productive cough, hemoptysis, dysphasia, odynophagia, melena, hematochezia, dysuria, hematuria, rash, seizure activity, orthopnea, PND, pedal edema, claudication. Remaining systems are negative.  Physical Exam: Well-developed well-nourished in no acute distress.  Skin is warm and dry.  HEENT is normal.  Neck is supple.  Chest is clear to auscultation with normal expansion.  Cardiovascular exam is irregular, 3/6 systolic murmur left sternal border. Abdominal exam nontender or distended. No masses palpated. Extremities show Trace edema. neuro grossly intact  ECG Atrial fibrillation at a rate of 63. Occasional PVCs or aberrantly conducted beats. Septal infarct. Inferior infarct.

## 2015-06-21 ENCOUNTER — Ambulatory Visit (INDEPENDENT_AMBULATORY_CARE_PROVIDER_SITE_OTHER): Payer: Medicare Other | Admitting: Cardiology

## 2015-06-21 ENCOUNTER — Encounter: Payer: Self-pay | Admitting: Cardiology

## 2015-06-21 VITALS — BP 126/68 | HR 63 | Ht 72.0 in | Wt 240.2 lb

## 2015-06-21 DIAGNOSIS — I4891 Unspecified atrial fibrillation: Secondary | ICD-10-CM | POA: Diagnosis not present

## 2015-06-21 DIAGNOSIS — I10 Essential (primary) hypertension: Secondary | ICD-10-CM

## 2015-06-21 DIAGNOSIS — I359 Nonrheumatic aortic valve disorder, unspecified: Secondary | ICD-10-CM

## 2015-06-21 DIAGNOSIS — I350 Nonrheumatic aortic (valve) stenosis: Secondary | ICD-10-CM

## 2015-06-21 DIAGNOSIS — I2510 Atherosclerotic heart disease of native coronary artery without angina pectoris: Secondary | ICD-10-CM | POA: Diagnosis not present

## 2015-06-21 DIAGNOSIS — I2583 Coronary atherosclerosis due to lipid rich plaque: Secondary | ICD-10-CM

## 2015-06-21 DIAGNOSIS — I701 Atherosclerosis of renal artery: Secondary | ICD-10-CM | POA: Diagnosis not present

## 2015-06-21 NOTE — Assessment & Plan Note (Signed)
Follow-up echocardiogram March 2017.Patient instructed on symptoms to be aware of for progressive aortic stenosis including dyspnea, chest pain and syncope. He will likely require aortic valve replacement in the future. Given age and previous coronary artery bypass graft he would likely require TAVR.

## 2015-06-21 NOTE — Assessment & Plan Note (Signed)
Followed by vascular surgery. 

## 2015-06-21 NOTE — Assessment & Plan Note (Signed)
Continue statin. 

## 2015-06-21 NOTE — Assessment & Plan Note (Signed)
Continue beta blocker for rate control. Continue pradaxa.

## 2015-06-21 NOTE — Patient Instructions (Signed)
Medication Instructions:   STAY ON CURRENT METOPROLOL DOSE  Testing/Procedures:  Your physician has requested that you have an echocardiogram. Echocardiography is a painless test that uses sound waves to create images of your heart. It provides your doctor with information about the size and shape of your heart and how well your heart's chambers and valves are working. This procedure takes approximately one hour. There are no restrictions for this procedure.  SCHEDULE IN MARCH  Follow-Up:  Your physician wants you to follow-up in: [place] will receive a reminder letter in the mail two months in advance. If you don't receive a letter, please call our office to schedule the follow-up appointment.   If you need a refill on your cardiac medications before your next appointment, please call your pharmacy.

## 2015-06-21 NOTE — Assessment & Plan Note (Signed)
Blood pressure controlled. Continue present medications. 

## 2015-06-21 NOTE — Assessment & Plan Note (Signed)
Continue statin.No aspirin given need for anticoagulation. 

## 2015-07-29 DIAGNOSIS — H43813 Vitreous degeneration, bilateral: Secondary | ICD-10-CM | POA: Diagnosis not present

## 2015-08-17 DIAGNOSIS — Z6831 Body mass index (BMI) 31.0-31.9, adult: Secondary | ICD-10-CM | POA: Diagnosis not present

## 2015-08-17 DIAGNOSIS — E1142 Type 2 diabetes mellitus with diabetic polyneuropathy: Secondary | ICD-10-CM | POA: Diagnosis not present

## 2015-08-17 DIAGNOSIS — I4891 Unspecified atrial fibrillation: Secondary | ICD-10-CM | POA: Diagnosis not present

## 2015-08-17 DIAGNOSIS — E669 Obesity, unspecified: Secondary | ICD-10-CM | POA: Diagnosis not present

## 2015-08-17 DIAGNOSIS — D509 Iron deficiency anemia, unspecified: Secondary | ICD-10-CM | POA: Diagnosis not present

## 2015-08-17 DIAGNOSIS — I2510 Atherosclerotic heart disease of native coronary artery without angina pectoris: Secondary | ICD-10-CM | POA: Diagnosis not present

## 2015-08-17 DIAGNOSIS — Z1389 Encounter for screening for other disorder: Secondary | ICD-10-CM | POA: Diagnosis not present

## 2015-08-17 DIAGNOSIS — M109 Gout, unspecified: Secondary | ICD-10-CM | POA: Diagnosis not present

## 2015-08-17 DIAGNOSIS — I10 Essential (primary) hypertension: Secondary | ICD-10-CM | POA: Diagnosis not present

## 2015-08-17 DIAGNOSIS — E785 Hyperlipidemia, unspecified: Secondary | ICD-10-CM | POA: Diagnosis not present

## 2015-08-22 ENCOUNTER — Other Ambulatory Visit: Payer: Self-pay

## 2015-08-22 ENCOUNTER — Ambulatory Visit (HOSPITAL_COMMUNITY): Payer: Medicare Other | Attending: Cardiology

## 2015-08-22 DIAGNOSIS — I052 Rheumatic mitral stenosis with insufficiency: Secondary | ICD-10-CM | POA: Insufficient documentation

## 2015-08-22 DIAGNOSIS — I359 Nonrheumatic aortic valve disorder, unspecified: Secondary | ICD-10-CM | POA: Insufficient documentation

## 2015-08-22 DIAGNOSIS — Z951 Presence of aortocoronary bypass graft: Secondary | ICD-10-CM | POA: Diagnosis not present

## 2015-08-22 DIAGNOSIS — I517 Cardiomegaly: Secondary | ICD-10-CM | POA: Insufficient documentation

## 2015-08-22 DIAGNOSIS — I739 Peripheral vascular disease, unspecified: Secondary | ICD-10-CM | POA: Insufficient documentation

## 2015-08-22 DIAGNOSIS — I2510 Atherosclerotic heart disease of native coronary artery without angina pectoris: Secondary | ICD-10-CM | POA: Diagnosis not present

## 2015-08-22 DIAGNOSIS — I071 Rheumatic tricuspid insufficiency: Secondary | ICD-10-CM | POA: Diagnosis not present

## 2015-08-22 DIAGNOSIS — I352 Nonrheumatic aortic (valve) stenosis with insufficiency: Secondary | ICD-10-CM | POA: Insufficient documentation

## 2015-09-09 DIAGNOSIS — H43813 Vitreous degeneration, bilateral: Secondary | ICD-10-CM | POA: Diagnosis not present

## 2015-09-09 DIAGNOSIS — H34811 Central retinal vein occlusion, right eye, with macular edema: Secondary | ICD-10-CM | POA: Diagnosis not present

## 2015-09-28 DIAGNOSIS — H348111 Central retinal vein occlusion, right eye, with retinal neovascularization: Secondary | ICD-10-CM | POA: Diagnosis not present

## 2015-09-28 DIAGNOSIS — H401132 Primary open-angle glaucoma, bilateral, moderate stage: Secondary | ICD-10-CM | POA: Diagnosis not present

## 2015-11-11 DIAGNOSIS — J440 Chronic obstructive pulmonary disease with acute lower respiratory infection: Secondary | ICD-10-CM | POA: Diagnosis not present

## 2015-11-11 DIAGNOSIS — Z6832 Body mass index (BMI) 32.0-32.9, adult: Secondary | ICD-10-CM | POA: Diagnosis not present

## 2015-11-16 DIAGNOSIS — S61401A Unspecified open wound of right hand, initial encounter: Secondary | ICD-10-CM | POA: Diagnosis not present

## 2015-11-28 DIAGNOSIS — E1142 Type 2 diabetes mellitus with diabetic polyneuropathy: Secondary | ICD-10-CM | POA: Diagnosis not present

## 2015-11-28 DIAGNOSIS — E785 Hyperlipidemia, unspecified: Secondary | ICD-10-CM | POA: Diagnosis not present

## 2015-11-28 DIAGNOSIS — I10 Essential (primary) hypertension: Secondary | ICD-10-CM | POA: Diagnosis not present

## 2015-11-28 DIAGNOSIS — I2510 Atherosclerotic heart disease of native coronary artery without angina pectoris: Secondary | ICD-10-CM | POA: Diagnosis not present

## 2015-11-28 DIAGNOSIS — I4891 Unspecified atrial fibrillation: Secondary | ICD-10-CM | POA: Diagnosis not present

## 2015-11-28 DIAGNOSIS — Z9181 History of falling: Secondary | ICD-10-CM | POA: Diagnosis not present

## 2015-11-28 DIAGNOSIS — M109 Gout, unspecified: Secondary | ICD-10-CM | POA: Diagnosis not present

## 2015-11-28 DIAGNOSIS — D509 Iron deficiency anemia, unspecified: Secondary | ICD-10-CM | POA: Diagnosis not present

## 2015-11-28 DIAGNOSIS — Z6831 Body mass index (BMI) 31.0-31.9, adult: Secondary | ICD-10-CM | POA: Diagnosis not present

## 2015-11-30 ENCOUNTER — Telehealth: Payer: Self-pay | Admitting: Cardiology

## 2015-11-30 NOTE — Telephone Encounter (Signed)
New message   Pt wife is calling because the pt has the PCP do is labs and   she is calling to let rn know to call and get the lab results

## 2015-12-20 ENCOUNTER — Ambulatory Visit: Payer: Medicare Other | Admitting: Cardiology

## 2016-01-03 DIAGNOSIS — H43813 Vitreous degeneration, bilateral: Secondary | ICD-10-CM | POA: Diagnosis not present

## 2016-01-03 DIAGNOSIS — H34811 Central retinal vein occlusion, right eye, with macular edema: Secondary | ICD-10-CM | POA: Diagnosis not present

## 2016-01-05 DIAGNOSIS — M1712 Unilateral primary osteoarthritis, left knee: Secondary | ICD-10-CM | POA: Diagnosis not present

## 2016-01-23 DIAGNOSIS — L304 Erythema intertrigo: Secondary | ICD-10-CM | POA: Diagnosis not present

## 2016-01-23 DIAGNOSIS — L821 Other seborrheic keratosis: Secondary | ICD-10-CM | POA: Diagnosis not present

## 2016-01-23 DIAGNOSIS — L219 Seborrheic dermatitis, unspecified: Secondary | ICD-10-CM | POA: Diagnosis not present

## 2016-02-10 NOTE — Telephone Encounter (Signed)
Please close Encounter °

## 2016-02-16 NOTE — Progress Notes (Signed)
HPI: FU coronary artery disease, status post coronary artery bypassing graft, permanent atrial fibrillation, AS and peripheral vascular disease (renal artery stenosis and cerebrovascular disease). Pt had right carotid endarterectomy in August of 2014. Now followed by vascular surgery. Cath 3/15 showed EF 45, normal LM, 100 LAD, Lcx and RCA; sequential SVG to OM2 and OM3 patent, LIMA to LAD patent. Patient had PTCA of distal LAD via LIMA graft. Renal Dopplers were performed in March 2016 and revealed 1-59% bilateral stenosis. Echocardiogram March 2017 showed normal LV systolic function, moderate aortic stenosis with mean gradient 31 mmHg, mild aortic insufficiency, mild mitral stenosis, mild mitral regurgitation and biatrial enlargement.Since last seen the patient has dyspnea with more extreme activities but not with routine activities. It is relieved with rest. It is not associated with chest pain. There is no orthopnea, PND or pedal edema. There is no syncope or palpitations. There is no exertional chest pain.   Current Outpatient Prescriptions  Medication Sig Dispense Refill  . acetaminophen (TYLENOL) 500 MG tablet Take 500 mg by mouth every 6 (six) hours as needed for pain.     [name] acyclovir (ZOVIRAX) 800 MG tablet Take 400 mg by mouth 2 (two) times daily.     [name] albuterol (PROVENTIL,VENTOLIN) 90 MCG/ACT inhaler Inhale 2 puffs into the lungs every 4 (four) hours as needed for shortness of breath.     [name] atorvastatin (LIPITOR) 80 MG tablet Take 1 tablet (80 mg total) by mouth at bedtime. 90 tablet 3  . azelastine (ASTELIN) 137 MCG/SPRAY nasal spray Place 2 sprays into both nostrils daily.     . benazepril (LOTENSIN) 20 MG tablet Take 10 mg by mouth daily.     . brimonidine (ALPHAGAN P) 0.1 % SOLN Place 1 drop into both eyes 2 (two) times daily.    [name] buPROPion (WELLBUTRIN XL) 150 MG 24 hr tablet Take 150 mg by mouth daily.    . Cholecalciferol (VITAMIN D) 2000 UNITS CAPS Take 2,000 Units by mouth  daily.    . dabigatran (PRADAXA) 150 MG CAPS Take 150 mg by mouth 2 (two) times daily.     . ferrous sulfate 325 (65 FE) MG tablet Take 325 mg by mouth daily with breakfast.     . furosemide (LASIX) 20 MG tablet Take 20 mg by mouth daily.      [name] ipratropium-albuterol (DUONEB) 0.5-2.5 (3) MG/3ML SOLN Take 3 mLs by nebulization 3 (three) times daily as needed (for bronchitis or other virus that affects breathing).     . lansoprazole (PREVACID) 30 MG capsule Take 30 mg by mouth daily.      . methocarbamol (ROBAXIN) 750 MG tablet Take 750 mg by mouth 2 (two) times daily.     . metoprolol (LOPRESSOR) 50 MG tablet Take 1 tablet (50 mg total) by mouth 2 (two) times daily. 180 tablet 4  . Multiple Vitamin (MULTIVITAMIN WITH MINERALS) TABS Take 1 tablet by mouth daily.    . multivitamin-lutein (OCUVITE-LUTEIN) CAPS Take 1 capsule by mouth daily.    [name] terazosin (HYTRIN) 5 MG capsule Take 5 mg by mouth at bedtime.     . trolamine salicylate (ASPERCREME) 10 % cream Apply 1 application topically 4 (four) times daily as needed (pain).      No current facility-administered medications for this visit.      Past Medical History:  Diagnosis Date  . Acute myocardial infarction, unspecified site, initial episode of care   . Anemia   . Anxiety   .  Aortic stenosis    . Atrial fibrillation ([place])   . CAD (coronary artery disease)   . Carotid stenosis   . CHF (congestive heart failure) ([place])   . COPD (chronic obstructive pulmonary disease) ([place])   . Depression   . Diabetes mellitus without complication ([place])    FASTING 98-120S  . GERD (gastroesophageal reflux disease)   . History of blood transfusion   . History of peptic ulcer disease   . Hyperlipidemia   . Hypertension   . Myocardial infarction ([place])    X2  . Peripheral vascular disease ([place])   . Pneumonia    HX OF  . Renal artery stenosis ([place])   . Sleep apnea    OXYGEN AT NIGHT 2L [place]    Past Surgical History:  Procedure Laterality Date  .  CAROTID ENDARTERECTOMY    . CORONARY ARTERY BYPASS GRAFT  1994  . ENDARTERECTOMY Right 01/05/2013   Procedure: ENDARTERECTOMY CAROTID;  Surgeon: Rosetta Posner, MD;  Location: [place];  Service: Vascular;  Laterality: Right;  . KNEE SURGERY  08/2003   left knee/ARTHROSCOPIC  . LEFT HEART CATHETERIZATION WITH CORONARY/GRAFT ANGIOGRAM N/A 09/01/2013   Procedure: LEFT HEART CATHETERIZATION WITH Beatrix Fetters;  Surgeon: Sinclair Grooms, MD;  Location: [facility] CATH LAB;  Service: Cardiovascular;  Laterality: N/A;  . PATCH ANGIOPLASTY Right 01/05/2013   Procedure: PATCH ANGIOPLASTY;  Surgeon: Rosetta Posner, MD;  Location: [place];  Service: Vascular;  Laterality: Right;  . PERCUTANEOUS CORONARY STENT INTERVENTION (PCI-S)  09/01/2013   Procedure: PERCUTANEOUS CORONARY STENT INTERVENTION (PCI-S);  Surgeon: Sinclair Grooms, MD;  Location: [facility] CATH LAB;  Service: Cardiovascular;;  . removal of bleeding ulcer  1994  . RENAL ARTERY STENT     stenting of the left renal artery as well as a cutting balloon angioplasty for treatment of in-stent restenosis coronary artery bypass grafting in 1994.    Social History   Social History  . Marital status: Married    Spouse name: N/A  . Number of children: N/A  . Years of education: N/A   Occupational History  . Not on file.   Social History Main Topics  . Smoking status: Former Smoker    Quit date: 04/27/1993  . Smokeless tobacco: Never Used  . Alcohol use No  . Drug use: No  . Sexual activity: Yes   Other Topics Concern  . Not on file   Social History Narrative   Social History:   Married    Tobacco Use - No.    Alcohol Use - yes         Family History:   Mother died at age 40 of coronary disease.  Father died at 22 of coronary disease.  He has 13 siblings, almost all of whom have coronary disease and some with premature onset.    Family History  Problem Relation Age of Onset  . Coronary artery disease Mother   . Heart disease Mother      Before age 49  . Hyperlipidemia Mother   . Hypertension Mother   . Heart attack Mother   . Coronary artery disease Father   . Heart disease Father     After age 22  . Heart attack Father   . Coronary artery disease      13 sibling, almost all have coronary disease and some with premature onset  . Heart disease      13 sibling, almost all have coronary disease and some with  premature onset  . Heart disease Sister     After age 27  . Heart disease Brother     After age 64    ROS: no fevers or chills, productive cough, hemoptysis, dysphasia, odynophagia, melena, hematochezia, dysuria, hematuria, rash, seizure activity, orthopnea, PND, pedal edema, claudication. Remaining systems are negative.  Physical Exam: Well-developed well-nourished in no acute distress.  Skin is warm and dry.  HEENT is normal.  Neck is supple.  Chest is clear to auscultation with normal expansion.  Cardiovascular exam is irregular 3/6 systolic murmur left sternal border.,  Abdominal exam nontender or distended. No masses palpated. Extremities show no edema. neuro grossly intact  ECG atrial fibrillation at a rate of 57. Left axis deviation. Left ventricular hypertrophy. Prior anterior and inferior infarct.  A/P  1 Plan follow-up echocardiogram March 2018. He understands to be aware of symptoms for progressive aortic stenosis including dyspnea, chest pain and syncope. He will likely require aortic valve replacement in the future.  2 permanent atrial fibrillation-continue beta blocker for rate control. Continue pradaxa.   3 carotid artery stenosis-followed by vascular surgery.  4 coronary artery disease-continue statin. No aspirin given need for anticoagulation.  5 hypertension-blood pressure controlled. Continue present medications.  6 hyperlipidemia-continue statin.  7 renal artery stenosis-continue statin.  Kirk Ruths, MD

## 2016-02-17 ENCOUNTER — Encounter: Payer: Self-pay | Admitting: Cardiology

## 2016-02-20 ENCOUNTER — Ambulatory Visit (INDEPENDENT_AMBULATORY_CARE_PROVIDER_SITE_OTHER): Payer: Medicare Other | Admitting: Cardiology

## 2016-02-20 ENCOUNTER — Encounter: Payer: Self-pay | Admitting: Cardiology

## 2016-02-20 VITALS — BP 142/58 | HR 57 | Ht 72.0 in | Wt 232.0 lb

## 2016-02-20 DIAGNOSIS — I10 Essential (primary) hypertension: Secondary | ICD-10-CM

## 2016-02-20 DIAGNOSIS — I25810 Atherosclerosis of coronary artery bypass graft(s) without angina pectoris: Secondary | ICD-10-CM | POA: Diagnosis not present

## 2016-02-20 DIAGNOSIS — I701 Atherosclerosis of renal artery: Secondary | ICD-10-CM | POA: Diagnosis not present

## 2016-02-20 DIAGNOSIS — I4891 Unspecified atrial fibrillation: Secondary | ICD-10-CM

## 2016-02-20 DIAGNOSIS — I2510 Atherosclerotic heart disease of native coronary artery without angina pectoris: Secondary | ICD-10-CM | POA: Diagnosis not present

## 2016-02-20 DIAGNOSIS — E785 Hyperlipidemia, unspecified: Secondary | ICD-10-CM | POA: Diagnosis not present

## 2016-02-20 DIAGNOSIS — I359 Nonrheumatic aortic valve disorder, unspecified: Secondary | ICD-10-CM

## 2016-02-20 NOTE — Patient Instructions (Signed)
Your physician wants you to follow-up in: 6 MONTHS WITH DR CRENSHAW You will receive a reminder letter in the mail two months in advance. If you don't receive a letter, please call our office to schedule the follow-up appointment.   If you need a refill on your cardiac medications before your next appointment, please call your pharmacy.  

## 2016-02-21 DIAGNOSIS — L300 Nummular dermatitis: Secondary | ICD-10-CM | POA: Diagnosis not present

## 2016-02-21 DIAGNOSIS — L304 Erythema intertrigo: Secondary | ICD-10-CM | POA: Diagnosis not present

## 2016-03-14 DIAGNOSIS — M109 Gout, unspecified: Secondary | ICD-10-CM | POA: Diagnosis not present

## 2016-03-14 DIAGNOSIS — I10 Essential (primary) hypertension: Secondary | ICD-10-CM | POA: Diagnosis not present

## 2016-03-14 DIAGNOSIS — I2510 Atherosclerotic heart disease of native coronary artery without angina pectoris: Secondary | ICD-10-CM | POA: Diagnosis not present

## 2016-03-14 DIAGNOSIS — D509 Iron deficiency anemia, unspecified: Secondary | ICD-10-CM | POA: Diagnosis not present

## 2016-03-14 DIAGNOSIS — E785 Hyperlipidemia, unspecified: Secondary | ICD-10-CM | POA: Diagnosis not present

## 2016-03-14 DIAGNOSIS — I4891 Unspecified atrial fibrillation: Secondary | ICD-10-CM | POA: Diagnosis not present

## 2016-03-14 DIAGNOSIS — E1142 Type 2 diabetes mellitus with diabetic polyneuropathy: Secondary | ICD-10-CM | POA: Diagnosis not present

## 2016-03-14 DIAGNOSIS — E669 Obesity, unspecified: Secondary | ICD-10-CM | POA: Diagnosis not present

## 2016-03-14 DIAGNOSIS — Z6830 Body mass index (BMI) 30.0-30.9, adult: Secondary | ICD-10-CM | POA: Diagnosis not present

## 2016-03-23 ENCOUNTER — Encounter: Payer: Self-pay | Admitting: Family

## 2016-03-27 ENCOUNTER — Encounter: Payer: Self-pay | Admitting: Family

## 2016-03-27 ENCOUNTER — Ambulatory Visit (HOSPITAL_COMMUNITY)
Admission: RE | Admit: 2016-03-27 | Discharge: 2016-03-27 | Disposition: A | Discharge status: Home / Self Care | Payer: Medicare Other | Source: Ambulatory Visit | Attending: Family | Admitting: Family

## 2016-03-27 ENCOUNTER — Ambulatory Visit (INDEPENDENT_AMBULATORY_CARE_PROVIDER_SITE_OTHER): Payer: Medicare Other | Admitting: Family

## 2016-03-27 VITALS — BP 128/66 | HR 70 | Temp 97.3°F | Resp 16 | Ht 72.0 in | Wt 225.0 lb

## 2016-03-27 DIAGNOSIS — I872 Venous insufficiency (chronic) (peripheral): Secondary | ICD-10-CM | POA: Diagnosis not present

## 2016-03-27 DIAGNOSIS — Z9889 Other specified postprocedural states: Secondary | ICD-10-CM

## 2016-03-27 DIAGNOSIS — Z48812 Encounter for surgical aftercare following surgery on the circulatory system: Secondary | ICD-10-CM | POA: Diagnosis not present

## 2016-03-27 DIAGNOSIS — I6523 Occlusion and stenosis of bilateral carotid arteries: Secondary | ICD-10-CM | POA: Diagnosis not present

## 2016-03-27 DIAGNOSIS — I701 Atherosclerosis of renal artery: Secondary | ICD-10-CM

## 2016-03-27 LAB — VAS US CAROTID
LEFT ECA DIAS: -36 cm/s
LICADDIAS: -14 cm/s
LICADSYS: -43 cm/s
LICAPDIAS: -42 cm/s
LICAPSYS: -231 cm/s
Left CCA dist dias: 12 cm/s
Left CCA dist sys: 46 cm/s
Left CCA prox dias: 14 cm/s
Left CCA prox sys: 58 cm/s
RIGHT CCA MID DIAS: 15 cm/s
RIGHT ECA DIAS: -3 cm/s
Right CCA prox dias: 9 cm/s
Right CCA prox sys: 85 cm/s
Right cca dist sys: -53 cm/s

## 2016-03-27 NOTE — Patient Instructions (Signed)
Stroke Prevention Some health problems and behaviors may make it more likely for you to have a stroke. Below are ways to lessen your risk of having a stroke.   Be active for at least 30 minutes on most or all days.  Do not smoke. Try not to be around others who smoke.  Do not drink too much alcohol.  Do not have more than 2 drinks a day if you are a man.  Do not have more than 1 drink a day if you are a woman and are not pregnant.  Eat healthy foods, such as fruits and vegetables. If you were put on a specific diet, follow the diet as told.  Keep your cholesterol levels under control through diet and medicines. Look for foods that are low in saturated fat, trans fat, cholesterol, and are high in fiber.  If you have diabetes, follow all diet plans and take your medicine as told.  Ask your doctor if you need treatment to lower your blood pressure. If you have high blood pressure (hypertension), follow all diet plans and take your medicine as told by your doctor.  If you are 26-85 years old, have your blood pressure checked every 3-5 years. If you are age 47 or older, have your blood pressure checked every year.  Keep a healthy weight. Eat foods that are low in calories, salt, saturated fat, trans fat, and cholesterol.  Do not take drugs.  Avoid birth control pills, if this applies. Talk to your doctor about the risks of taking birth control pills.  Talk to your doctor if you have sleep problems (sleep apnea).  Take all medicine as told by your doctor.  You may be told to take aspirin or blood thinner medicine. Take this medicine as told by your doctor.  Understand your medicine instructions.  Make sure any other conditions you have are being taken care of. GET HELP RIGHT AWAY IF:  You suddenly lose feeling (you feel numb) or have weakness in your face, arm, or leg.  Your face or eyelid hangs down to one side.  You suddenly feel confused.  You have trouble talking (aphasia)  or understanding what people are saying.  You suddenly have trouble seeing in one or both eyes.  You suddenly have trouble walking.  You are dizzy.  You lose your balance or your movements are clumsy (uncoordinated).  You suddenly have a very bad headache and you do not know the cause.  You have new chest pain.  Your heart feels like it is fluttering or skipping a beat (irregular heartbeat). Do not wait to see if the symptoms above go away. Get help right away. Call your local emergency services (911 in [place] Do not drive yourself to the hospital.   This information is not intended to replace advice given to you by your health care provider. Make sure you discuss any questions you have with your health care provider.   Document Released: 11/20/2011 Document Revised: 06/11/2014 Document Reviewed: 11/21/2012 Elsevier Interactive Patient Education 2016 Elsevier Inc.     Venous Stasis or Chronic Venous Insufficiency Chronic venous insufficiency, also called venous stasis, is a condition that affects the veins in the legs. The condition prevents blood from being pumped through these veins effectively. Blood may no longer be pumped effectively from the legs back to the heart. This condition can range from mild to severe. With proper treatment, you should be able to continue with an active life. CAUSES  Chronic venous insufficiency  occurs when the vein walls become stretched, weakened, or damaged or when valves within the vein are damaged. Some common causes of this include:  High blood pressure inside the veins (venous hypertension).  Increased blood pressure in the leg veins from long periods of sitting or standing.  A blood clot that blocks blood flow in a vein (deep vein thrombosis).  Inflammation of a superficial vein (phlebitis) that causes a blood clot to form. RISK FACTORS Various things can make you more likely to develop chronic venous insufficiency, including:  Family  history of this condition.  Obesity.  Pregnancy.  Sedentary lifestyle.  Smoking.  Jobs requiring long periods of standing or sitting in one place.  Being a certain age. Women in their 78s and 49s and men in their 57s are more likely to develop this condition. SIGNS AND SYMPTOMS  Symptoms may include:   Varicose veins.  Skin breakdown or ulcers.  Reddened or discolored skin on the leg.  Brown, smooth, tight, and painful skin just above the ankle, usually on the inside surface (lipodermatosclerosis).  Swelling. DIAGNOSIS  To diagnose this condition, your health care provider will take a medical history and do a physical exam. The following tests may be ordered to confirm the diagnosis:  Duplex ultrasound--A procedure that produces a picture of a blood vessel and nearby organs and also provides information on blood flow through the blood vessel.  Plethysmography--A procedure that tests blood flow.  A venogram, or venography--A procedure used to look at the veins using X-ray and dye. TREATMENT The goals of treatment are to help you return to an active life and to minimize pain or disability. Treatment will depend on the severity of the condition. Medical procedures may be needed for severe cases. Treatment options may include:   Use of compression stockings. These can help with symptoms and lower the chances of the problem getting worse, but they do not cure the problem.  Sclerotherapy--A procedure involving an injection of a material that "dissolves" the damaged veins. Other veins in the network of blood vessels take over the function of the damaged veins.  Surgery to remove the vein or cut off blood flow through the vein (vein stripping or laser ablation surgery).  Surgery to repair a valve. HOME CARE INSTRUCTIONS   Wear compression stockings as directed by your health care provider.  Only take over-the-counter or prescription medicines for pain, discomfort, or fever as  directed by your health care provider.  Follow up with your health care provider as directed. SEEK MEDICAL CARE IF:   You have redness, swelling, or increasing pain in the affected area.  You see a red streak or line that extends up or down from the affected area.  You have a breakdown or loss of skin in the affected area, even if the breakdown is small.  You have an injury to the affected area. SEEK IMMEDIATE MEDICAL CARE IF:   You have an injury and open wound in the affected area.  Your pain is severe and does not improve with medicine.  You have sudden numbness or weakness in the foot or ankle below the affected area, or you have trouble moving your foot or ankle.  You have a fever or persistent symptoms for more than 2-3 days.  You have a fever and your symptoms suddenly get worse. MAKE SURE YOU:   Understand these instructions.  Will watch your condition.  Will get help right away if you are not doing well or get  worse.   This information is not intended to replace advice given to you by your health care provider. Make sure you discuss any questions you have with your health care provider.   Document Released: 09/24/2006 Document Revised: 03/11/2013 Document Reviewed: 01/26/2013 Elsevier Interactive Patient Education [employer].

## 2016-03-27 NOTE — Progress Notes (Signed)
Chief Complaint: Follow up Extracranial Carotid Artery Stenosis   History of Present Illness  [name] is a 82 y.o. male patient of Dr. Donnetta Hutching who returns today for followup of his right carotid endarterectomy for severe asymptomatic disease in August of 2014.  He was hospitalized with congestive heart failure in January 2015. In March, 2015 he had a myocardial infarction and had cardiac catheterization and what sounds like angioplasty of right coronary artery. February, 2015 he had repair of perforated small intestine, does not know etiology.  Patient denies any history of stroke or TIA, specifically he denies a history of amaurosis fugax or monocular blindness, unilateral facial drooping,  hemiplegia, or receptive or expressive aphasia.   He denies claudication symptoms in legs with walking, denies non healing wounds. He walks his dog 4-5 x/day. He uses his stationary bike 3x/week.  Pt and wife report that the swelling in his lower legs resolves with overnight elevation of his legs.   Pt Diabetic: Yes, diet controlled, wife states last A1C was 6.4 Pt smoker: former smoker, quit in 1994, same day he had the CABG  Pt meds include: Statin : Yes ASA: No Other anticoagulants/antiplatelets: Pradaxa for atrial fib, managed by Dr. Lelon Perla office. [name] wife states they would like to have Dr. Jacalyn Lefevre office monitor his carotid arteries.    Past Medical History:  Diagnosis Date  . Acute myocardial infarction, unspecified site, initial episode of care   . Anemia   . Anxiety   . Aortic stenosis    . Atrial fibrillation ([place])   . CAD (coronary artery disease)   . Carotid stenosis   . CHF (congestive heart failure) ([place])   . COPD (chronic obstructive pulmonary disease) ([place])   . Depression   . Diabetes mellitus without complication ([place])    FASTING 98-120S  . GERD (gastroesophageal reflux disease)   . History of blood transfusion   . History of peptic ulcer  disease   . Hyperlipidemia   . Hypertension   . Myocardial infarction    X2  . Peripheral vascular disease ([place])   . Pneumonia    HX OF  . Renal artery stenosis ([place])   . Sleep apnea    OXYGEN AT NIGHT 2L [place]    Social History Social History  Substance Use Topics  . Smoking status: Former Smoker    Quit date: 04/27/1993  . Smokeless tobacco: Never Used  . Alcohol use No    Family History Family History  Problem Relation Age of Onset  . Coronary artery disease Mother   . Heart disease Mother     Before age 57  . Hyperlipidemia Mother   . Hypertension Mother   . Heart attack Mother   . Coronary artery disease Father   . Heart disease Father     After age 39  . Heart attack Father   . Heart disease Sister     After age 35  . Heart disease Brother     After age 49  . Coronary artery disease      13 sibling, almost all have coronary disease and some with premature onset  . Heart disease      13 sibling, almost all have coronary disease and some with premature onset    Surgical History Past Surgical History:  Procedure Laterality Date  . CAROTID ENDARTERECTOMY    . CORONARY ARTERY BYPASS GRAFT  1994  . ENDARTERECTOMY Right 01/05/2013   Procedure: ENDARTERECTOMY CAROTID;  Surgeon: Sherren Mocha  Katina Dung, MD;  Location: [facility];  Service: Vascular;  Laterality: Right;  . KNEE SURGERY  08/2003   left knee/ARTHROSCOPIC  . LEFT HEART CATHETERIZATION WITH CORONARY/GRAFT ANGIOGRAM N/A 09/01/2013   Procedure: LEFT HEART CATHETERIZATION WITH Beatrix Fetters;  Surgeon: Sinclair Grooms, MD;  Location: [facility] CATH LAB;  Service: Cardiovascular;  Laterality: N/A;  . PATCH ANGIOPLASTY Right 01/05/2013   Procedure: PATCH ANGIOPLASTY;  Surgeon: Rosetta Posner, MD;  Location: [place];  Service: Vascular;  Laterality: Right;  . PERCUTANEOUS CORONARY STENT INTERVENTION (PCI-S)  09/01/2013   Procedure: PERCUTANEOUS CORONARY STENT INTERVENTION (PCI-S);  Surgeon: Sinclair Grooms, MD;  Location: [facility]  CATH LAB;  Service: Cardiovascular;;  . removal of bleeding ulcer  1994  . RENAL ARTERY STENT     stenting of the left renal artery as well as a cutting balloon angioplasty for treatment of in-stent restenosis coronary artery bypass grafting in 1994.    Allergies  Allergen Reactions  . Bactrim [Sulfamethoxazole-Trimethoprim] Other (See Comments)    "does not do anything for me"    Current Outpatient Prescriptions  Medication Sig Dispense Refill  . acetaminophen (TYLENOL) 500 MG tablet Take 500 mg by mouth every 6 (six) hours as needed for pain.     [name] acyclovir (ZOVIRAX) 800 MG tablet Take 400 mg by mouth 2 (two) times daily.     [name] albuterol (PROVENTIL,VENTOLIN) 90 MCG/ACT inhaler Inhale 2 puffs into the lungs every 4 (four) hours as needed for shortness of breath.     [name] atorvastatin (LIPITOR) 80 MG tablet Take 1 tablet (80 mg total) by mouth at bedtime. 90 tablet 3  . azelastine (ASTELIN) 137 MCG/SPRAY nasal spray Place 2 sprays into both nostrils daily.     . benazepril (LOTENSIN) 20 MG tablet Take 10 mg by mouth daily.     . brimonidine (ALPHAGAN P) 0.1 % SOLN Place 1 drop into both eyes 2 (two) times daily.    . Cholecalciferol (VITAMIN D) 2000 UNITS CAPS Take 2,000 Units by mouth daily.    . dabigatran (PRADAXA) 150 MG CAPS Take 150 mg by mouth 2 (two) times daily.     . ferrous sulfate 325 (65 FE) MG tablet Take 325 mg by mouth daily with breakfast.     . furosemide (LASIX) 20 MG tablet Take 20 mg by mouth daily.      [name] ipratropium-albuterol (DUONEB) 0.5-2.5 (3) MG/3ML SOLN Take 3 mLs by nebulization 3 (three) times daily as needed (for bronchitis or other virus that affects breathing).     . lansoprazole (PREVACID) 30 MG capsule Take 30 mg by mouth daily.      . metoprolol (LOPRESSOR) 50 MG tablet Take 1 tablet (50 mg total) by mouth 2 (two) times daily. 180 tablet 4  . Multiple Vitamin (MULTIVITAMIN WITH MINERALS) TABS Take 1 tablet by mouth daily.    . multivitamin-lutein  (OCUVITE-LUTEIN) CAPS Take 1 capsule by mouth daily.    . predniSONE (DELTASONE) 20 MG tablet Take 20 mg by mouth daily with breakfast.    . terazosin (HYTRIN) 5 MG capsule Take 5 mg by mouth at bedtime.     . trolamine salicylate (ASPERCREME) 10 % cream Apply 1 application topically 4 (four) times daily as needed (pain).      No current facility-administered medications for this visit.     Review of Systems : See HPI for pertinent positives and negatives.  Physical Examination  Vitals:   03/27/16 1343 03/27/16 1345  BP: 112/60 128/66  Pulse: 72 70  Resp:  16  Temp:  97.3 F (36.3 C)  TempSrc:  Oral  SpO2:  98%  Weight:  225 lb (102.1 kg)  Height:  6' (1.829 m)   Body mass index is 30.52 kg/m.  General: WDWN obese male in NAD GAIT: normal Eyes: PERRLA Pulmonary: Respirations are non-labored, CTAB, good air movement in all fields.  Cardiac: Irregular rhythm, feet appear well perfused. 2+ bilateral pretibial pitting edema.  VASCULAR EXAM Carotid Bruits Right Left   Negative Negative   Radial pulses are 2+ palpable and equal.      LE Pulses Right Left   POPLITEAL not palpable  not palpable   POSTERIOR TIBIAL not palpable  not palpable    DORSALIS PEDIS  ANTERIOR TIBIAL not palpable  1+ palpable     Gastrointestinal: soft, nontender, BS WNL, no r/g, no palpable masses.  Musculoskeletal: No muscle atrophy/wasting. M/S 5/5 throughout, Extremities without ischemic changes.  Neurologic: A&O X 3; Appropriate Affect ; SENSATION ;normal;  Speech is normal CN 2-12 intact, Pain and light touch intact in extremities, Motor exam as listed above.     Assessment: AZZAN BUTLER is a 82 y.o. male who is s/p right carotid endarterectomy on 01/05/2013. He has no history of stroke or  TIA.  Chronic venous insufficiency with mild/moderate dependent edema: measured for and given a pair of 20-30 mmHg graduated knee high compression hose at a previous visit; also given printed information for discount outlet for compression hose in [place].   DATA Today's carotid duplex suggests a patent right carotid endarterectomy site with no evidence of hyperplasia or restenosis.  Left internal carotid artery velocities suggest a 40-59% stenosis.  Bilateral vertebral arteries are antegrade. Bilateral subclavian arteries are multiphasic.  No significant change in comparison to the last exam on 03/21/15.   Plan: Follow-up with Dr. Jacalyn Lefevre office for continued surveillance of carotid artery stenosis at pt and wife request, since his office is monitoring his renal arteries s/p stent placement.  I discussed in depth with the patient the nature of atherosclerosis, and emphasized the importance of maximal medical management including strict control of blood pressure, blood glucose, and lipid levels, obtaining regular exercise, and continued cessation of smoking.  The patient is aware that without maximal medical management the underlying atherosclerotic disease process will progress, limiting the benefit of any interventions. The patient was given information about stroke prevention and what symptoms should prompt the patient to seek immediate medical care. Thank you for allowing [place] to participate in this patient's care.  Clemon Chambers, RN, MSN, FNP-C [facility] Office: [phone]  Clinic Physician: Early  03/27/16 2:24 PM

## 2016-05-08 DIAGNOSIS — H34811 Central retinal vein occlusion, right eye, with macular edema: Secondary | ICD-10-CM | POA: Diagnosis not present

## 2016-05-08 DIAGNOSIS — H43813 Vitreous degeneration, bilateral: Secondary | ICD-10-CM | POA: Diagnosis not present

## 2016-05-08 DIAGNOSIS — H211X1 Other vascular disorders of iris and ciliary body, right eye: Secondary | ICD-10-CM | POA: Diagnosis not present

## 2016-05-22 DIAGNOSIS — H34811 Central retinal vein occlusion, right eye, with macular edema: Secondary | ICD-10-CM | POA: Diagnosis not present

## 2016-06-04 DIAGNOSIS — I482 Chronic atrial fibrillation: Secondary | ICD-10-CM | POA: Diagnosis not present

## 2016-06-04 DIAGNOSIS — I509 Heart failure, unspecified: Secondary | ICD-10-CM | POA: Diagnosis not present

## 2016-06-04 DIAGNOSIS — M84376A Stress fracture, unspecified foot, initial encounter for fracture: Secondary | ICD-10-CM

## 2016-06-04 DIAGNOSIS — J440 Chronic obstructive pulmonary disease with acute lower respiratory infection: Secondary | ICD-10-CM | POA: Diagnosis not present

## 2016-06-04 DIAGNOSIS — Z9981 Dependence on supplemental oxygen: Secondary | ICD-10-CM | POA: Diagnosis not present

## 2016-06-04 DIAGNOSIS — J189 Pneumonia, unspecified organism: Secondary | ICD-10-CM | POA: Diagnosis not present

## 2016-06-04 DIAGNOSIS — E669 Obesity, unspecified: Secondary | ICD-10-CM | POA: Diagnosis present

## 2016-06-04 DIAGNOSIS — Z87891 Personal history of nicotine dependence: Secondary | ICD-10-CM | POA: Diagnosis not present

## 2016-06-04 DIAGNOSIS — Z951 Presence of aortocoronary bypass graft: Secondary | ICD-10-CM | POA: Diagnosis not present

## 2016-06-04 DIAGNOSIS — R0902 Hypoxemia: Secondary | ICD-10-CM | POA: Diagnosis not present

## 2016-06-04 DIAGNOSIS — I252 Old myocardial infarction: Secondary | ICD-10-CM | POA: Diagnosis not present

## 2016-06-04 DIAGNOSIS — I2510 Atherosclerotic heart disease of native coronary artery without angina pectoris: Secondary | ICD-10-CM | POA: Diagnosis not present

## 2016-06-04 DIAGNOSIS — R918 Other nonspecific abnormal finding of lung field: Secondary | ICD-10-CM | POA: Diagnosis not present

## 2016-06-04 DIAGNOSIS — Z7952 Long term (current) use of systemic steroids: Secondary | ICD-10-CM | POA: Diagnosis not present

## 2016-06-04 DIAGNOSIS — J449 Chronic obstructive pulmonary disease, unspecified: Secondary | ICD-10-CM | POA: Diagnosis not present

## 2016-06-04 DIAGNOSIS — J159 Unspecified bacterial pneumonia: Secondary | ICD-10-CM | POA: Diagnosis not present

## 2016-06-04 DIAGNOSIS — R069 Unspecified abnormalities of breathing: Secondary | ICD-10-CM | POA: Diagnosis not present

## 2016-06-04 DIAGNOSIS — Z7984 Long term (current) use of oral hypoglycemic drugs: Secondary | ICD-10-CM | POA: Diagnosis not present

## 2016-06-04 DIAGNOSIS — J9621 Acute and chronic respiratory failure with hypoxia: Secondary | ICD-10-CM | POA: Diagnosis present

## 2016-06-04 DIAGNOSIS — I10 Essential (primary) hypertension: Secondary | ICD-10-CM | POA: Diagnosis not present

## 2016-06-04 DIAGNOSIS — Z79899 Other long term (current) drug therapy: Secondary | ICD-10-CM | POA: Diagnosis not present

## 2016-06-04 DIAGNOSIS — J441 Chronic obstructive pulmonary disease with (acute) exacerbation: Secondary | ICD-10-CM | POA: Diagnosis present

## 2016-06-04 DIAGNOSIS — E119 Type 2 diabetes mellitus without complications: Secondary | ICD-10-CM | POA: Diagnosis not present

## 2016-06-04 HISTORY — DX: Stress fracture, unspecified foot, initial encounter for fracture: M84.376A

## 2016-06-06 DIAGNOSIS — J449 Chronic obstructive pulmonary disease, unspecified: Secondary | ICD-10-CM

## 2016-06-06 DIAGNOSIS — J189 Pneumonia, unspecified organism: Secondary | ICD-10-CM

## 2016-06-06 DIAGNOSIS — I509 Heart failure, unspecified: Secondary | ICD-10-CM

## 2016-06-06 DIAGNOSIS — I10 Essential (primary) hypertension: Secondary | ICD-10-CM

## 2016-06-06 DIAGNOSIS — I2510 Atherosclerotic heart disease of native coronary artery without angina pectoris: Secondary | ICD-10-CM

## 2016-06-06 DIAGNOSIS — E119 Type 2 diabetes mellitus without complications: Secondary | ICD-10-CM

## 2016-06-13 DIAGNOSIS — E785 Hyperlipidemia, unspecified: Secondary | ICD-10-CM | POA: Diagnosis not present

## 2016-06-13 DIAGNOSIS — I2510 Atherosclerotic heart disease of native coronary artery without angina pectoris: Secondary | ICD-10-CM | POA: Diagnosis not present

## 2016-06-13 DIAGNOSIS — M109 Gout, unspecified: Secondary | ICD-10-CM | POA: Diagnosis not present

## 2016-06-13 DIAGNOSIS — E1142 Type 2 diabetes mellitus with diabetic polyneuropathy: Secondary | ICD-10-CM | POA: Diagnosis not present

## 2016-06-13 DIAGNOSIS — J189 Pneumonia, unspecified organism: Secondary | ICD-10-CM | POA: Diagnosis not present

## 2016-06-13 DIAGNOSIS — I4891 Unspecified atrial fibrillation: Secondary | ICD-10-CM | POA: Diagnosis not present

## 2016-06-13 DIAGNOSIS — I10 Essential (primary) hypertension: Secondary | ICD-10-CM | POA: Diagnosis not present

## 2016-06-13 DIAGNOSIS — Z6829 Body mass index (BMI) 29.0-29.9, adult: Secondary | ICD-10-CM | POA: Diagnosis not present

## 2016-06-13 DIAGNOSIS — D509 Iron deficiency anemia, unspecified: Secondary | ICD-10-CM | POA: Diagnosis not present

## 2016-06-13 DIAGNOSIS — J441 Chronic obstructive pulmonary disease with (acute) exacerbation: Secondary | ICD-10-CM | POA: Diagnosis not present

## 2016-07-02 DIAGNOSIS — J90 Pleural effusion, not elsewhere classified: Secondary | ICD-10-CM | POA: Diagnosis not present

## 2016-07-02 DIAGNOSIS — J449 Chronic obstructive pulmonary disease, unspecified: Secondary | ICD-10-CM | POA: Diagnosis not present

## 2016-07-02 DIAGNOSIS — J189 Pneumonia, unspecified organism: Secondary | ICD-10-CM | POA: Diagnosis not present

## 2016-07-02 DIAGNOSIS — R0602 Shortness of breath: Secondary | ICD-10-CM | POA: Diagnosis not present

## 2016-07-03 DIAGNOSIS — J449 Chronic obstructive pulmonary disease, unspecified: Secondary | ICD-10-CM | POA: Diagnosis not present

## 2016-07-03 DIAGNOSIS — I509 Heart failure, unspecified: Secondary | ICD-10-CM | POA: Diagnosis not present

## 2016-07-03 DIAGNOSIS — J189 Pneumonia, unspecified organism: Secondary | ICD-10-CM | POA: Diagnosis not present

## 2016-07-03 DIAGNOSIS — Z6830 Body mass index (BMI) 30.0-30.9, adult: Secondary | ICD-10-CM | POA: Diagnosis not present

## 2016-08-14 NOTE — Progress Notes (Signed)
HPI: FU coronary artery disease, status post coronary artery bypassing graft, permanent atrial fibrillation, AS and peripheral vascular disease (renal artery stenosis and cerebrovascular disease). Pt had right carotid endarterectomy in August of 2014. Now followed by vascular surgery. Cath 3/15 showed EF 45, normal LM, 100 LAD, Lcx and RCA; sequential SVG to OM2 and OM3 patent, LIMA to LAD patent. Patient had PTCA of distal LAD via LIMA graft. Renal Dopplers were performed in March 2016 and revealed 1-59% bilateral stenosis. Echocardiogram March 2017 showed normal LV systolic function, moderate aortic stenosis with mean gradient 31 mmHg, mild aortic insufficiency, mild mitral stenosis, mild mitral regurgitation and biatrial enlargement. Since last seen the patient has dyspnea with more extreme activities but not with routine activities. It is relieved with rest. It is not associated with chest pain. There is no orthopnea, PND or pedal edema. There is no syncope or palpitations. There is no exertional chest pain.   Current Outpatient Prescriptions  Medication Sig Dispense Refill  . acetaminophen (TYLENOL) 500 MG tablet Take 500 mg by mouth every 6 (six) hours as needed for pain.     [name] acyclovir (ZOVIRAX) 800 MG tablet Take 400 mg by mouth 2 (two) times daily.     [name] albuterol (PROVENTIL,VENTOLIN) 90 MCG/ACT inhaler Inhale 2 puffs into the lungs every 4 (four) hours as needed for shortness of breath.     [name] atorvastatin (LIPITOR) 80 MG tablet Take 1 tablet (80 mg total) by mouth at bedtime. 90 tablet 3  . azelastine (ASTELIN) 137 MCG/SPRAY nasal spray Place 2 sprays into both nostrils daily.     . brimonidine (ALPHAGAN P) 0.1 % SOLN Place 1 drop into both eyes 2 (two) times daily.    . Cholecalciferol (VITAMIN D) 2000 UNITS CAPS Take 2,000 Units by mouth daily.    . dabigatran (PRADAXA) 150 MG CAPS Take 150 mg by mouth 2 (two) times daily.     . ferrous sulfate 325 (65 FE) MG tablet Take 325 mg by  mouth daily with breakfast.     . furosemide (LASIX) 20 MG tablet Take 20 mg by mouth daily.      [name] ipratropium-albuterol (DUONEB) 0.5-2.5 (3) MG/3ML SOLN Take 3 mLs by nebulization 3 (three) times daily as needed (for bronchitis or other virus that affects breathing).     . lansoprazole (PREVACID) 30 MG capsule Take 30 mg by mouth daily.      . metoprolol (LOPRESSOR) 50 MG tablet Take 1 tablet (50 mg total) by mouth 2 (two) times daily. 180 tablet 4  . Multiple Vitamin (MULTIVITAMIN WITH MINERALS) TABS Take 1 tablet by mouth daily.    . multivitamin-lutein (OCUVITE-LUTEIN) CAPS Take 1 capsule by mouth daily.    . predniSONE (DELTASONE) 20 MG tablet Take 20 mg by mouth as needed.     . terazosin (HYTRIN) 5 MG capsule Take 5 mg by mouth at bedtime.     . trolamine salicylate (ASPERCREME) 10 % cream Apply 1 application topically 4 (four) times daily as needed (pain).      No current facility-administered medications for this visit.      Past Medical History:  Diagnosis Date  . Acute myocardial infarction, unspecified site, initial episode of care   . Anemia   . Anxiety   . Aortic stenosis    . Atrial fibrillation ([place])   . CAD (coronary artery disease)   . Carotid stenosis   . CHF (congestive heart failure) ([place])   .  COPD (chronic obstructive pulmonary disease) ([place])   . Depression   . Diabetes mellitus without complication ([place])    FASTING 98-120S  . GERD (gastroesophageal reflux disease)   . History of blood transfusion   . History of peptic ulcer disease   . Hyperlipidemia   . Hypertension   . Myocardial infarction    X2  . Peripheral vascular disease ([place])   . Pneumonia    HX OF  . Renal artery stenosis ([place])   . Sleep apnea    OXYGEN AT NIGHT 2L [place]    Past Surgical History:  Procedure Laterality Date  . CAROTID ENDARTERECTOMY    . CORONARY ARTERY BYPASS GRAFT  1994  . ENDARTERECTOMY Right 01/05/2013   Procedure: ENDARTERECTOMY CAROTID;  Surgeon: Rosetta Posner, MD;   Location: [place];  Service: Vascular;  Laterality: Right;  . KNEE SURGERY  08/2003   left knee/ARTHROSCOPIC  . LEFT HEART CATHETERIZATION WITH CORONARY/GRAFT ANGIOGRAM N/A 09/01/2013   Procedure: LEFT HEART CATHETERIZATION WITH Beatrix Fetters;  Surgeon: Sinclair Grooms, MD;  Location: [facility] CATH LAB;  Service: Cardiovascular;  Laterality: N/A;  . PATCH ANGIOPLASTY Right 01/05/2013   Procedure: PATCH ANGIOPLASTY;  Surgeon: Rosetta Posner, MD;  Location: [place];  Service: Vascular;  Laterality: Right;  . PERCUTANEOUS CORONARY STENT INTERVENTION (PCI-S)  09/01/2013   Procedure: PERCUTANEOUS CORONARY STENT INTERVENTION (PCI-S);  Surgeon: Sinclair Grooms, MD;  Location: [facility] C[facility] LAB;  Service: Cardiovascular;;  . removal of bleeding ulcer  1994  . RENAL ARTERY STENT     stenting of the left renal artery as well as a cutting balloon angioplasty for treatment of in-stent restenosis coronary artery bypass grafting in 1994.    Social History   Social History  . Marital status: Married    Spouse name: N/A  . Number of children: N/A  . Years of education: N/A   Occupational History  . Not on file.   Social History Main Topics  . Smoking status: Former Smoker    Quit date: 04/27/1993  . Smokeless tobacco: Never Used  . Alcohol use No  . Drug use: No  . Sexual activity: Yes   Other Topics Concern  . Not on file   Social History Narrative   Social History:   Married    Tobacco Use - No.    Alcohol Use - yes         Family History:   Mother died at age 86 of coronary disease.  Father died at 43 of coronary disease.  He has 13 siblings, almost all of whom have coronary disease and some with premature onset.    Family History  Problem Relation Age of Onset  . Coronary artery disease Mother   . Heart disease Mother     Before age 76  . Hyperlipidemia Mother   . Hypertension Mother   . Heart attack Mother   . Coronary artery disease Father   . Heart disease Father     After  age 63  . Heart attack Father   . Heart disease Sister     After age 61  . Heart disease Brother     After age 67  . Coronary artery disease      13 sibling, almost all have coronary disease and some with premature onset  . Heart disease      13 sibling, almost all have coronary disease and some with premature onset    ROS: no fevers or  chills, productive cough, hemoptysis, dysphasia, odynophagia, melena, hematochezia, dysuria, hematuria, rash, seizure activity, orthopnea, PND, pedal edema, claudication. Remaining systems are negative.  Physical Exam: Well-developed well-nourished in no acute distress.  Skin is warm and dry.  HEENT is normal.  Neck is supple.  Chest is clear to auscultation with normal expansion.  Cardiovascular exam is irregular, 3/6 systolic murmur LSB; S2 diminished Abdominal exam nontender or distended. No masses palpated. Extremities show no edema. neuro grossly intact  ECG- atrial fibrillation at a rate of 61. Left anterior fascicular block. Anterior infarct. Left ventricular hypertrophy. personally reviewed  A/P  1 Permanent atrial fibrillation-continue beta blocker for rate control. Continue pradaxa.   2 aortic stenosis-arrange follow-up echocardiogram. Patient understands he may require aortic valve replacement in the future. I explain symptoms to be aware of including dyspnea, syncope and chest pain.  3 carotid artery disease-plan fu carotid dopplers 10/18.  4 coronary artery disease-continue statin. No aspirin given need for anticoagulation.  5 hypertension-blood pressure controlled. Continue present medications.  6 hyperlipidemia-continue statin.  7 renal artery stenosis-continue statin. Schedule fu renal dopplers.    Kirk Ruths, MD

## 2016-08-15 DIAGNOSIS — Z6829 Body mass index (BMI) 29.0-29.9, adult: Secondary | ICD-10-CM | POA: Diagnosis not present

## 2016-08-15 DIAGNOSIS — J440 Chronic obstructive pulmonary disease with acute lower respiratory infection: Secondary | ICD-10-CM | POA: Diagnosis not present

## 2016-08-16 ENCOUNTER — Encounter: Payer: Self-pay | Admitting: Cardiology

## 2016-08-21 DIAGNOSIS — H4051X1 Glaucoma secondary to other eye disorders, right eye, mild stage: Secondary | ICD-10-CM | POA: Diagnosis not present

## 2016-08-21 DIAGNOSIS — H34811 Central retinal vein occlusion, right eye, with macular edema: Secondary | ICD-10-CM | POA: Diagnosis not present

## 2016-08-27 ENCOUNTER — Ambulatory Visit (INDEPENDENT_AMBULATORY_CARE_PROVIDER_SITE_OTHER): Payer: Medicare Other | Admitting: Cardiology

## 2016-08-27 ENCOUNTER — Encounter: Payer: Self-pay | Admitting: Cardiology

## 2016-08-27 VITALS — BP 142/54 | HR 61 | Ht 72.0 in | Wt 222.0 lb

## 2016-08-27 DIAGNOSIS — I701 Atherosclerosis of renal artery: Secondary | ICD-10-CM

## 2016-08-27 DIAGNOSIS — I679 Cerebrovascular disease, unspecified: Secondary | ICD-10-CM

## 2016-08-27 DIAGNOSIS — I350 Nonrheumatic aortic (valve) stenosis: Secondary | ICD-10-CM

## 2016-08-27 NOTE — Patient Instructions (Signed)
Medication Instructions:   NO CHANGE  Testing/Procedures:  Your physician has requested that you have an echocardiogram. Echocardiography is a painless test that uses sound waves to create images of your heart. It provides your doctor with information about the size and shape of your heart and how well your heart's chambers and valves are working. This procedure takes approximately one hour. There are no restrictions for this procedure.   Your physician has requested that you have a renal artery duplex. During this test, an ultrasound is used to evaluate blood flow to the kidneys. Allow one hour for this exam. Do not eat after midnight the day before and avoid carbonated beverages. Take your medications as you usually do.    Follow-Up:  Your physician wants you to follow-up in: [place] will receive a reminder letter in the mail two months in advance. If you don't receive a letter, please call our office to schedule the follow-up appointment.   If you need a refill on your cardiac medications before your next appointment, please call your pharmacy.

## 2016-09-11 ENCOUNTER — Other Ambulatory Visit: Payer: Self-pay

## 2016-09-11 ENCOUNTER — Ambulatory Visit (HOSPITAL_COMMUNITY): Payer: Medicare Other | Attending: Internal Medicine

## 2016-09-11 DIAGNOSIS — E785 Hyperlipidemia, unspecified: Secondary | ICD-10-CM | POA: Insufficient documentation

## 2016-09-11 DIAGNOSIS — I4891 Unspecified atrial fibrillation: Secondary | ICD-10-CM | POA: Diagnosis not present

## 2016-09-11 DIAGNOSIS — I083 Combined rheumatic disorders of mitral, aortic and tricuspid valves: Secondary | ICD-10-CM | POA: Insufficient documentation

## 2016-09-11 DIAGNOSIS — I371 Nonrheumatic pulmonary valve insufficiency: Secondary | ICD-10-CM | POA: Diagnosis not present

## 2016-09-11 DIAGNOSIS — I2510 Atherosclerotic heart disease of native coronary artery without angina pectoris: Secondary | ICD-10-CM | POA: Insufficient documentation

## 2016-09-11 DIAGNOSIS — I350 Nonrheumatic aortic (valve) stenosis: Secondary | ICD-10-CM

## 2016-09-12 ENCOUNTER — Ambulatory Visit (HOSPITAL_COMMUNITY)
Admission: RE | Admit: 2016-09-12 | Discharge: 2016-09-12 | Disposition: A | Discharge status: Home / Self Care | Payer: Medicare Other | Source: Ambulatory Visit | Attending: Cardiology | Admitting: Cardiology

## 2016-09-12 DIAGNOSIS — I701 Atherosclerosis of renal artery: Secondary | ICD-10-CM

## 2016-09-12 DIAGNOSIS — I774 Celiac artery compression syndrome: Secondary | ICD-10-CM | POA: Insufficient documentation

## 2016-09-12 DIAGNOSIS — Z8673 Personal history of transient ischemic attack (TIA), and cerebral infarction without residual deficits: Secondary | ICD-10-CM | POA: Insufficient documentation

## 2016-09-12 DIAGNOSIS — J449 Chronic obstructive pulmonary disease, unspecified: Secondary | ICD-10-CM | POA: Insufficient documentation

## 2016-09-12 DIAGNOSIS — I2510 Atherosclerotic heart disease of native coronary artery without angina pectoris: Secondary | ICD-10-CM | POA: Insufficient documentation

## 2016-09-12 DIAGNOSIS — Z87891 Personal history of nicotine dependence: Secondary | ICD-10-CM | POA: Insufficient documentation

## 2016-09-12 DIAGNOSIS — Z951 Presence of aortocoronary bypass graft: Secondary | ICD-10-CM | POA: Insufficient documentation

## 2016-09-12 DIAGNOSIS — E785 Hyperlipidemia, unspecified: Secondary | ICD-10-CM | POA: Insufficient documentation

## 2016-09-12 DIAGNOSIS — I739 Peripheral vascular disease, unspecified: Secondary | ICD-10-CM | POA: Diagnosis not present

## 2016-09-12 DIAGNOSIS — I10 Essential (primary) hypertension: Secondary | ICD-10-CM | POA: Insufficient documentation

## 2016-09-17 DIAGNOSIS — Z6830 Body mass index (BMI) 30.0-30.9, adult: Secondary | ICD-10-CM | POA: Diagnosis not present

## 2016-09-17 DIAGNOSIS — I4891 Unspecified atrial fibrillation: Secondary | ICD-10-CM | POA: Diagnosis not present

## 2016-09-17 DIAGNOSIS — D509 Iron deficiency anemia, unspecified: Secondary | ICD-10-CM | POA: Diagnosis not present

## 2016-09-17 DIAGNOSIS — E1142 Type 2 diabetes mellitus with diabetic polyneuropathy: Secondary | ICD-10-CM | POA: Diagnosis not present

## 2016-09-17 DIAGNOSIS — E785 Hyperlipidemia, unspecified: Secondary | ICD-10-CM | POA: Diagnosis not present

## 2016-09-17 DIAGNOSIS — M109 Gout, unspecified: Secondary | ICD-10-CM | POA: Diagnosis not present

## 2016-09-17 DIAGNOSIS — Z23 Encounter for immunization: Secondary | ICD-10-CM | POA: Diagnosis not present

## 2016-09-17 DIAGNOSIS — J449 Chronic obstructive pulmonary disease, unspecified: Secondary | ICD-10-CM | POA: Diagnosis not present

## 2016-09-17 DIAGNOSIS — I10 Essential (primary) hypertension: Secondary | ICD-10-CM | POA: Diagnosis not present

## 2016-09-17 DIAGNOSIS — Z1389 Encounter for screening for other disorder: Secondary | ICD-10-CM | POA: Diagnosis not present

## 2016-09-17 DIAGNOSIS — I2510 Atherosclerotic heart disease of native coronary artery without angina pectoris: Secondary | ICD-10-CM | POA: Diagnosis not present

## 2016-09-25 DIAGNOSIS — Z125 Encounter for screening for malignant neoplasm of prostate: Secondary | ICD-10-CM | POA: Diagnosis not present

## 2016-09-25 DIAGNOSIS — E785 Hyperlipidemia, unspecified: Secondary | ICD-10-CM | POA: Diagnosis not present

## 2016-09-25 DIAGNOSIS — Z9181 History of falling: Secondary | ICD-10-CM | POA: Diagnosis not present

## 2016-09-25 DIAGNOSIS — Z136 Encounter for screening for cardiovascular disorders: Secondary | ICD-10-CM | POA: Diagnosis not present

## 2016-09-25 DIAGNOSIS — Z6830 Body mass index (BMI) 30.0-30.9, adult: Secondary | ICD-10-CM | POA: Diagnosis not present

## 2016-09-25 DIAGNOSIS — Z0000 Encounter for general adult medical examination without abnormal findings: Secondary | ICD-10-CM | POA: Diagnosis not present

## 2016-09-25 DIAGNOSIS — Z1389 Encounter for screening for other disorder: Secondary | ICD-10-CM | POA: Diagnosis not present

## 2016-10-01 DIAGNOSIS — H348111 Central retinal vein occlusion, right eye, with retinal neovascularization: Secondary | ICD-10-CM | POA: Diagnosis not present

## 2016-10-01 DIAGNOSIS — H401132 Primary open-angle glaucoma, bilateral, moderate stage: Secondary | ICD-10-CM | POA: Diagnosis not present

## 2016-10-02 NOTE — Progress Notes (Signed)
HPI: FU coronary artery disease, status post coronary artery bypassing graft, permanent atrial fibrillation, AS and peripheral vascular disease (renal artery stenosis and cerebrovascular disease). Pt had right carotid endarterectomy in August of 2014. Now followed by vascular surgery. Cath 3/15 showed EF 45, normal LM, 100 LAD, Lcx and RCA; sequential SVG to OM2 and OM3 patent, LIMA to LAD patent. Patient had PTCA of distal LAD via LIMA graft. Last echo 4/18 showed normal LV function; severe AS with mean gradient 46 mmHg, moderate mitral stenosis, mild MR, biatrial enlargement; moderate TR. Ultrasound 4/18 showed dilated aorta (2.3 x 2.4 cm), bilateral 1-59 RAS; patent left renal artery stent. Since last seen patient denies dyspnea, chest pain, palpitations or syncope. Occasional mild pedal edema.  Current Outpatient Prescriptions  Medication Sig Dispense Refill  . acetaminophen (TYLENOL) 500 MG tablet Take 500 mg by mouth every 6 (six) hours as needed for pain.     [name] acyclovir (ZOVIRAX) 800 MG tablet Take 400 mg by mouth 2 (two) times daily.     [name] albuterol (PROVENTIL,VENTOLIN) 90 MCG/ACT inhaler Inhale 2 puffs into the lungs every 4 (four) hours as needed for shortness of breath.     [name] atorvastatin (LIPITOR) 80 MG tablet Take 1 tablet (80 mg total) by mouth at bedtime. 90 tablet 3  . azelastine (ASTELIN) 137 MCG/SPRAY nasal spray Place 2 sprays into both nostrils daily.     . brimonidine (ALPHAGAN P) 0.1 % SOLN Place 1 drop into both eyes 2 (two) times daily.    . Cholecalciferol (VITAMIN D) 2000 UNITS CAPS Take 2,000 Units by mouth daily.    . dabigatran (PRADAXA) 150 MG CAPS Take 150 mg by mouth 2 (two) times daily.     . ferrous sulfate 325 (65 FE) MG tablet Take 325 mg by mouth daily with breakfast.     . furosemide (LASIX) 20 MG tablet Take 20 mg by mouth daily.      [name] ipratropium-albuterol (DUONEB) 0.5-2.5 (3) MG/3ML SOLN Take 3 mLs by nebulization 3 (three) times daily as needed (for  bronchitis or other virus that affects breathing).     . lansoprazole (PREVACID) 30 MG capsule Take 30 mg by mouth daily.      . metoprolol (LOPRESSOR) 50 MG tablet Take 1 tablet (50 mg total) by mouth 2 (two) times daily. 180 tablet 4  . Multiple Vitamin (MULTIVITAMIN WITH MINERALS) TABS Take 1 tablet by mouth daily.    . multivitamin-lutein (OCUVITE-LUTEIN) CAPS Take 1 capsule by mouth daily.    . predniSONE (DELTASONE) 20 MG tablet Take 20 mg by mouth as needed.     . terazosin (HYTRIN) 5 MG capsule Take 5 mg by mouth at bedtime.     . trolamine salicylate (ASPERCREME) 10 % cream Apply 1 application topically 4 (four) times daily as needed (pain).      No current facility-administered medications for this visit.      Past Medical History:  Diagnosis Date  . Acute myocardial infarction, unspecified site, initial episode of care   . Anemia   . Anxiety   . Aortic stenosis    . Atrial fibrillation ([place])   . CAD (coronary artery disease)   . Carotid stenosis   . CHF (congestive heart failure) ([place])   . COPD (chronic obstructive pulmonary disease) ([place])   . Depression   . Diabetes mellitus without complication ([place])    FASTING 98-120S  . GERD (gastroesophageal reflux disease)   . History of  blood transfusion   . History of peptic ulcer disease   . Hyperlipidemia   . Hypertension   . Myocardial infarction ([place])    X2  . Peripheral vascular disease ([place])   . Pneumonia    HX OF  . Renal artery stenosis ([place])   . Sleep apnea    OXYGEN AT NIGHT 2L [place]    Past Surgical History:  Procedure Laterality Date  . CAROTID ENDARTERECTOMY    . CORONARY ARTERY BYPASS GRAFT  1994  . ENDARTERECTOMY Right 01/05/2013   Procedure: ENDARTERECTOMY CAROTID;  Surgeon: Rosetta Posner, MD;  Location: [place];  Service: Vascular;  Laterality: Right;  . KNEE SURGERY  08/2003   left knee/ARTHROSCOPIC  . LEFT HEART CATHETERIZATION WITH CORONARY/GRAFT ANGIOGRAM N/A 09/01/2013   Procedure: LEFT HEART  CATHETERIZATION WITH Beatrix Fetters;  Surgeon: Sinclair Grooms, MD;  Location: [facility] CATH LAB;  Service: Cardiovascular;  Laterality: N/A;  . PATCH ANGIOPLASTY Right 01/05/2013   Procedure: PATCH ANGIOPLASTY;  Surgeon: Rosetta Posner, MD;  Location: [place];  Service: Vascular;  Laterality: Right;  . PERCUTANEOUS CORONARY STENT INTERVENTION (PCI-S)  09/01/2013   Procedure: PERCUTANEOUS CORONARY STENT INTERVENTION (PCI-S);  Surgeon: Sinclair Grooms, MD;  Location: [facility] CATH LAB;  Service: Cardiovascular;;  . removal of bleeding ulcer  1994  . RENAL ARTERY STENT     stenting of the left renal artery as well as a cutting balloon angioplasty for treatment of in-stent restenosis coronary artery bypass grafting in 1994.    Social History   Social History  . Marital status: Married    Spouse name: N/A  . Number of children: N/A  . Years of education: N/A   Occupational History  . Not on file.   Social History Main Topics  . Smoking status: Former Smoker    Quit date: 04/27/1993  . Smokeless tobacco: Never Used  . Alcohol use No  . Drug use: No  . Sexual activity: Yes   Other Topics Concern  . Not on file   Social History Narrative   Social History:   Married    Tobacco Use - No.    Alcohol Use - yes         Family History:   Mother died at age 74 of coronary disease.  Father died at 74 of coronary disease.  He has 13 siblings, almost all of whom have coronary disease and some with premature onset.    Family History  Problem Relation Age of Onset  . Coronary artery disease Mother   . Heart disease Mother     Before age 55  . Hyperlipidemia Mother   . Hypertension Mother   . Heart attack Mother   . Coronary artery disease Father   . Heart disease Father     After age 84  . Heart attack Father   . Heart disease Sister     After age 48  . Heart disease Brother     After age 53  . Coronary artery disease      13 sibling, almost all have coronary disease and some with  premature onset  . Heart disease      13 sibling, almost all have coronary disease and some with premature onset    ROS: no fevers or chills, productive cough, hemoptysis, dysphasia, odynophagia, melena, hematochezia, dysuria, hematuria, rash, seizure activity, orthopnea, PND, pedal edema, claudication. Remaining systems are negative.  Physical Exam: Well-developed well-nourished in no acute distress.  Skin  is warm and dry.  HEENT is normal.  Neck is supple.  Chest is clear to auscultation with normal expansion. No wheeze Cardiovascular exam is irregular; 3/6 systolic murmur left sternal border. S2 is diminished. Abdominal exam nontender or distended. No masses palpated. Extremities show no edema. neuro grossly intact   A/P   1 Permanent atrial fibrillation-continue beta blocker for rate control. Continue pradaxa.   2 aortic stenosis-I have personally reviewed the patient's echocardiogram. He now has severe aortic stenosis and probably moderate mitral stenosis. He is not having symptoms at this point. Difficult situation. He could potentially be a candidate for TAVR but this would not address his mitral valve. He would be high risk for aortic and mitral valve replacement particularly given that he has had previous sternotomy for coronary artery bypass and graft. At present he is not having symptoms. I will see him back in 3 months and I've instructed him to contact me with any complaints of dyspnea, chest pain or syncope. In the meantime I will review his echocardiogram and case with my surgical and interventional colleagues.   3 carotid artery disease-plan fu carotid dopplers 10/18.  4 coronary artery disease-continue statin. No aspirin given need for anticoagulation.  5 hypertension-blood pressure controlled. Continue present medications.  6 hyperlipidemia-continue statin.  7 renal artery stenosis-continue statin. Schedule fu renal dopplers.  Kirk Ruths, MD

## 2016-10-06 DIAGNOSIS — C44519 Basal cell carcinoma of skin of other part of trunk: Secondary | ICD-10-CM | POA: Diagnosis not present

## 2016-10-06 DIAGNOSIS — L300 Nummular dermatitis: Secondary | ICD-10-CM | POA: Diagnosis not present

## 2016-10-06 DIAGNOSIS — L299 Pruritus, unspecified: Secondary | ICD-10-CM | POA: Diagnosis not present

## 2016-10-09 ENCOUNTER — Encounter: Payer: Self-pay | Admitting: Cardiology

## 2016-10-09 ENCOUNTER — Ambulatory Visit (INDEPENDENT_AMBULATORY_CARE_PROVIDER_SITE_OTHER): Payer: Medicare Other | Admitting: Cardiology

## 2016-10-09 VITALS — BP 126/60 | HR 69 | Ht 72.0 in | Wt 219.0 lb

## 2016-10-09 DIAGNOSIS — E7800 Pure hypercholesterolemia, unspecified: Secondary | ICD-10-CM | POA: Diagnosis not present

## 2016-10-09 DIAGNOSIS — I350 Nonrheumatic aortic (valve) stenosis: Secondary | ICD-10-CM

## 2016-10-09 DIAGNOSIS — I10 Essential (primary) hypertension: Secondary | ICD-10-CM

## 2016-10-09 DIAGNOSIS — I701 Atherosclerosis of renal artery: Secondary | ICD-10-CM | POA: Diagnosis not present

## 2016-10-09 DIAGNOSIS — I25810 Atherosclerosis of coronary artery bypass graft(s) without angina pectoris: Secondary | ICD-10-CM | POA: Diagnosis not present

## 2016-10-09 NOTE — Patient Instructions (Signed)
Your physician recommends that you schedule a follow-up appointment in: 3 MONTHS WITH DR CRENSHAW  If you need a refill on your cardiac medications before your next appointment, please call your pharmacy.   

## 2016-12-11 DIAGNOSIS — M545 Low back pain: Secondary | ICD-10-CM | POA: Diagnosis not present

## 2016-12-11 DIAGNOSIS — M5442 Lumbago with sciatica, left side: Secondary | ICD-10-CM | POA: Diagnosis not present

## 2016-12-12 DIAGNOSIS — M5416 Radiculopathy, lumbar region: Secondary | ICD-10-CM | POA: Diagnosis not present

## 2016-12-12 DIAGNOSIS — M545 Low back pain: Secondary | ICD-10-CM | POA: Diagnosis not present

## 2016-12-18 DIAGNOSIS — E1142 Type 2 diabetes mellitus with diabetic polyneuropathy: Secondary | ICD-10-CM | POA: Diagnosis not present

## 2016-12-18 DIAGNOSIS — I10 Essential (primary) hypertension: Secondary | ICD-10-CM | POA: Diagnosis not present

## 2016-12-18 DIAGNOSIS — D509 Iron deficiency anemia, unspecified: Secondary | ICD-10-CM | POA: Diagnosis not present

## 2016-12-18 DIAGNOSIS — I4891 Unspecified atrial fibrillation: Secondary | ICD-10-CM | POA: Diagnosis not present

## 2016-12-18 DIAGNOSIS — M109 Gout, unspecified: Secondary | ICD-10-CM | POA: Diagnosis not present

## 2016-12-18 DIAGNOSIS — I2510 Atherosclerotic heart disease of native coronary artery without angina pectoris: Secondary | ICD-10-CM | POA: Diagnosis not present

## 2016-12-18 DIAGNOSIS — Z6830 Body mass index (BMI) 30.0-30.9, adult: Secondary | ICD-10-CM | POA: Diagnosis not present

## 2016-12-18 DIAGNOSIS — E785 Hyperlipidemia, unspecified: Secondary | ICD-10-CM | POA: Diagnosis not present

## 2016-12-19 DIAGNOSIS — H4051X1 Glaucoma secondary to other eye disorders, right eye, mild stage: Secondary | ICD-10-CM | POA: Diagnosis not present

## 2016-12-19 DIAGNOSIS — H34811 Central retinal vein occlusion, right eye, with macular edema: Secondary | ICD-10-CM | POA: Diagnosis not present

## 2016-12-24 DIAGNOSIS — M5416 Radiculopathy, lumbar region: Secondary | ICD-10-CM | POA: Diagnosis not present

## 2016-12-24 DIAGNOSIS — M545 Low back pain: Secondary | ICD-10-CM | POA: Diagnosis not present

## 2016-12-26 DIAGNOSIS — M545 Low back pain: Secondary | ICD-10-CM | POA: Diagnosis not present

## 2016-12-26 DIAGNOSIS — M5416 Radiculopathy, lumbar region: Secondary | ICD-10-CM | POA: Diagnosis not present

## 2016-12-28 DIAGNOSIS — M4855XA Collapsed vertebra, not elsewhere classified, thoracolumbar region, initial encounter for fracture: Secondary | ICD-10-CM | POA: Diagnosis not present

## 2016-12-28 DIAGNOSIS — M5126 Other intervertebral disc displacement, lumbar region: Secondary | ICD-10-CM | POA: Diagnosis not present

## 2016-12-28 DIAGNOSIS — M5127 Other intervertebral disc displacement, lumbosacral region: Secondary | ICD-10-CM | POA: Diagnosis not present

## 2016-12-28 DIAGNOSIS — M47816 Spondylosis without myelopathy or radiculopathy, lumbar region: Secondary | ICD-10-CM | POA: Diagnosis not present

## 2017-01-01 DIAGNOSIS — S22080A Wedge compression fracture of T11-T12 vertebra, initial encounter for closed fracture: Secondary | ICD-10-CM | POA: Diagnosis not present

## 2017-01-02 DIAGNOSIS — M545 Low back pain: Secondary | ICD-10-CM | POA: Diagnosis not present

## 2017-01-02 DIAGNOSIS — M5416 Radiculopathy, lumbar region: Secondary | ICD-10-CM | POA: Diagnosis not present

## 2017-01-07 DIAGNOSIS — M545 Low back pain: Secondary | ICD-10-CM | POA: Diagnosis not present

## 2017-01-07 DIAGNOSIS — M5416 Radiculopathy, lumbar region: Secondary | ICD-10-CM | POA: Diagnosis not present

## 2017-01-09 DIAGNOSIS — M545 Low back pain: Secondary | ICD-10-CM | POA: Diagnosis not present

## 2017-01-09 DIAGNOSIS — M5416 Radiculopathy, lumbar region: Secondary | ICD-10-CM | POA: Diagnosis not present

## 2017-01-14 DIAGNOSIS — M5416 Radiculopathy, lumbar region: Secondary | ICD-10-CM | POA: Diagnosis not present

## 2017-01-14 DIAGNOSIS — M545 Low back pain: Secondary | ICD-10-CM | POA: Diagnosis not present

## 2017-01-15 DIAGNOSIS — Z6831 Body mass index (BMI) 31.0-31.9, adult: Secondary | ICD-10-CM | POA: Diagnosis not present

## 2017-01-15 DIAGNOSIS — J440 Chronic obstructive pulmonary disease with acute lower respiratory infection: Secondary | ICD-10-CM | POA: Diagnosis not present

## 2017-01-15 NOTE — Progress Notes (Signed)
HPI: FU coronary artery disease, status post coronary artery bypassing graft, permanent atrial fibrillation, AS and peripheral vascular disease (renal artery stenosis and cerebrovascular disease). Pt had right carotid endarterectomy in August of 2014. Cath 3/15 showed EF 45, normal LM, 100 LAD, Lcx and RCA; sequential SVG to OM2 and OM3 patent, LIMA to LAD patent. Patient had PTCA of distal LAD via LIMA graft. Last echo 4/18 showed normal LV function; severe AS with mean gradient 46 mmHg, moderate mitral stenosis, mild MR, biatrial enlargement; moderate TR. Ultrasound 4/18 showed dilated aorta (2.3 x 2.4 cm), bilateral 1-59 RAS; patent left renal artery stent. Since last seen patient denies dyspnea, chest pain, palpitations or syncope. He has chronic edema in his left ankle.  Current Outpatient Prescriptions  Medication Sig Dispense Refill  . acetaminophen (TYLENOL) 500 MG tablet Take 500 mg by mouth every 6 (six) hours as needed for pain.     [name] acyclovir (ZOVIRAX) 800 MG tablet Take 400 mg by mouth 2 (two) times daily.     [name] albuterol (PROVENTIL,VENTOLIN) 90 MCG/ACT inhaler Inhale 2 puffs into the lungs every 4 (four) hours as needed for shortness of breath.     [name] atorvastatin (LIPITOR) 80 MG tablet Take 1 tablet (80 mg total) by mouth at bedtime. 90 tablet 3  . azelastine (ASTELIN) 137 MCG/SPRAY nasal spray Place 2 sprays into both nostrils daily.     . brimonidine (ALPHAGAN P) 0.1 % SOLN Place 1 drop into both eyes 2 (two) times daily.    . Cholecalciferol (VITAMIN D) 2000 UNITS CAPS Take 2,000 Units by mouth daily.    . dabigatran (PRADAXA) 150 MG CAPS Take 150 mg by mouth 2 (two) times daily.     . ferrous sulfate 325 (65 FE) MG tablet Take 325 mg by mouth daily with breakfast.     . furosemide (LASIX) 20 MG tablet Take 20 mg by mouth daily.      [name] ipratropium-albuterol (DUONEB) 0.5-2.5 (3) MG/3ML SOLN Take 3 mLs by nebulization 3 (three) times daily as needed (for bronchitis or other  virus that affects breathing).     . lansoprazole (PREVACID) 30 MG capsule Take 30 mg by mouth daily.      . metoprolol (LOPRESSOR) 50 MG tablet Take 1 tablet (50 mg total) by mouth 2 (two) times daily. 180 tablet 4  . Multiple Vitamin (MULTIVITAMIN WITH MINERALS) TABS Take 1 tablet by mouth daily.    . multivitamin-lutein (OCUVITE-LUTEIN) CAPS Take 1 capsule by mouth daily.    . predniSONE (DELTASONE) 20 MG tablet Take 20 mg by mouth as needed.     . terazosin (HYTRIN) 5 MG capsule Take 5 mg by mouth at bedtime.     . trolamine salicylate (ASPERCREME) 10 % cream Apply 1 application topically 4 (four) times daily as needed (pain).      No current facility-administered medications for this visit.      Past Medical History:  Diagnosis Date  . Acute myocardial infarction, unspecified site, initial episode of care   . Anemia   . Anxiety   . Aortic stenosis    . Atrial fibrillation ([place])   . CAD (coronary artery disease)   . Carotid stenosis   . CHF (congestive heart failure) ([place])   . COPD (chronic obstructive pulmonary disease) ([place])   . Depression   . Diabetes mellitus without complication ([place])    FASTING 98-120S  . GERD (gastroesophageal reflux disease)   . History of blood  transfusion   . History of peptic ulcer disease   . Hyperlipidemia   . Hypertension   . Myocardial infarction ([place])    X2  . Peripheral vascular disease ([place])   . Pneumonia    HX OF  . Renal artery stenosis ([place])   . Sleep apnea    OXYGEN AT NIGHT 2L [place]    Past Surgical History:  Procedure Laterality Date  . CAROTID ENDARTERECTOMY    . CORONARY ARTERY BYPASS GRAFT  1994  . ENDARTERECTOMY Right 01/05/2013   Procedure: ENDARTERECTOMY CAROTID;  Surgeon: Rosetta Posner, MD;  Location: [place];  Service: Vascular;  Laterality: Right;  . KNEE SURGERY  08/2003   left knee/ARTHROSCOPIC  . LEFT HEART CATHETERIZATION WITH CORONARY/GRAFT ANGIOGRAM N/A 09/01/2013   Procedure: LEFT HEART CATHETERIZATION WITH  Beatrix Fetters;  Surgeon: Sinclair Grooms, MD;  Location[facility] CATH LAB;  Service: Cardiovascular;  Laterality: N/A;  . PATCH ANGIOPLASTY Right 01/05/2013   Procedure: PATCH ANGIOPLASTY;  Surgeon: Rosetta Posner, MD;  Location: [place];  Service: Vascular;  Laterality: Right;  . PERCUTANEOUS CORONARY STENT INTERVENTION (PCI-S)  09/01/2013   Procedure: PERCUTANEOUS CORONARY STENT INTERVENTION (PCI-S);  Surgeon: Sinclair Grooms, MD;  Location[facility] CATH LAB;  Service: Cardiovascular;;  . removal of bleeding ulcer  1994  . RENAL ARTERY STENT     stenting of the left renal artery as well as a cutting balloon angioplasty for treatment of in-stent restenosis coronary artery bypass grafting in 1994.    Social History   Social History  . Marital status: Married    Spouse name: N/A  . Number of children: N/A  . Years of education: N/A   Occupational History  . Not on file.   Social History Main Topics  . Smoking status: Former Smoker    Quit date: 04/27/1993  . Smokeless tobacco: Never Used  . Alcohol use No  . Drug use: No  . Sexual activity: Yes   Other Topics Concern  . Not on file   Social History Narrative   Social History:   Married    Tobacco Use - No.    Alcohol Use - yes         Family History:   Mother died at age 43 of coronary disease.  Father died at 68 of coronary disease.  He has 13 siblings, almost all of whom have coronary disease and some with premature onset.    Family History  Problem Relation Age of Onset  . Coronary artery disease Mother   . Heart disease Mother        Before age 2  . Hyperlipidemia Mother   . Hypertension Mother   . Heart attack Mother   . Coronary artery disease Father   . Heart disease Father        After age 49  . Heart attack Father   . Heart disease Sister        After age 71  . Heart disease Brother        After age 47  . Coronary artery disease Unknown        13 sibling, almost all have coronary disease and some  with premature onset  . Heart disease Unknown        13 sibling, almost all have coronary disease and some with premature onset    ROS: Back pain but no fevers or chills, productive cough, hemoptysis, dysphasia, odynophagia, melena, hematochezia, dysuria, hematuria, rash, seizure activity,  orthopnea, PND, pedal edema, claudication. Remaining systems are negative.  Physical Exam: Well-developed well-nourished in no acute distress.  Skin is warm and dry.  HEENT is normal.  Neck is supple.  Chest is clear to auscultation with normal expansion.  Cardiovascular exam is regular rate and rhythm. 3/6 systolic murmur Abdominal exam nontender or distended. No masses palpated. Extremities show trace edema. neuro grossly intact   A/P  1 aortic stenosis-patient has severe aortic stenosis and moderate mitral stenosis. He is not having symptoms. I did discuss this with Dr. Burt Knack previously. We could consider TAVR in the future if he develops symptoms and hopefully avoid intervention for mitral valve. Given his age and the fact this would be redo surgery he would be high risk if valve surgery required. He will need follow-up echocardiograms in the future (likely 6 months). I have discussed the symptoms to be aware of including dyspnea, chest pain and syncope.  2 Permanent atrial fibrillation-continue beta blocker for rate control. Continue pradaxa.  3 carotid artery disease-patient will need follow-up carotid Dopplers October 2018.  4 coronary artery disease-continue statin. He is not on aspirin given need for anticoagulation.  5 hypertension-blood pressure is controlled. Continue present medications.  6 hyperlipidemia-continue statin.  7 renal artery stenosis-continue statin.  Kirk Ruths, MD

## 2017-01-17 ENCOUNTER — Ambulatory Visit (INDEPENDENT_AMBULATORY_CARE_PROVIDER_SITE_OTHER): Payer: Medicare Other | Admitting: Cardiology

## 2017-01-17 ENCOUNTER — Encounter: Payer: Self-pay | Admitting: Cardiology

## 2017-01-17 VITALS — BP 120/70 | HR 70 | Ht 72.0 in | Wt 215.0 lb

## 2017-01-17 DIAGNOSIS — I701 Atherosclerosis of renal artery: Secondary | ICD-10-CM | POA: Diagnosis not present

## 2017-01-17 DIAGNOSIS — E7800 Pure hypercholesterolemia, unspecified: Secondary | ICD-10-CM

## 2017-01-17 DIAGNOSIS — I10 Essential (primary) hypertension: Secondary | ICD-10-CM

## 2017-01-17 DIAGNOSIS — I350 Nonrheumatic aortic (valve) stenosis: Secondary | ICD-10-CM

## 2017-01-17 DIAGNOSIS — I2510 Atherosclerotic heart disease of native coronary artery without angina pectoris: Secondary | ICD-10-CM | POA: Diagnosis not present

## 2017-01-17 NOTE — Patient Instructions (Signed)
Your physician wants you to follow-up in: 6 MONTHS WITH DR CRENSHAW You will receive a reminder letter in the mail two months in advance. If you don't receive a letter, please call our office to schedule the follow-up appointment.   If you need a refill on your cardiac medications before your next appointment, please call your pharmacy.  

## 2017-01-24 ENCOUNTER — Ambulatory Visit (INDEPENDENT_AMBULATORY_CARE_PROVIDER_SITE_OTHER): Payer: Medicare Other | Admitting: Podiatry

## 2017-01-24 ENCOUNTER — Encounter: Payer: Self-pay | Admitting: Podiatry

## 2017-01-24 ENCOUNTER — Ambulatory Visit (INDEPENDENT_AMBULATORY_CARE_PROVIDER_SITE_OTHER): Payer: Medicare Other

## 2017-01-24 VITALS — BP 134/64 | HR 61 | Resp 16

## 2017-01-24 DIAGNOSIS — I701 Atherosclerosis of renal artery: Secondary | ICD-10-CM | POA: Diagnosis not present

## 2017-01-24 DIAGNOSIS — M84374A Stress fracture, right foot, initial encounter for fracture: Secondary | ICD-10-CM

## 2017-01-24 DIAGNOSIS — M722 Plantar fascial fibromatosis: Secondary | ICD-10-CM

## 2017-01-24 MED ORDER — TRIAMCINOLONE ACETONIDE 10 MG/ML IJ SUSP
10.0000 mg | Freq: Once | INTRAMUSCULAR | Status: AC
  Administered 2017-01-24: 10 mg

## 2017-01-24 NOTE — Patient Instructions (Signed)

## 2017-01-24 NOTE — Progress Notes (Signed)
Subjective:    Patient ID: [name], male   DOB: 83 y.o.   MRN: [id number]   HPI patient presents stating he's developed acute pain in his left heel over the last several weeks and it's very difficult for him to walk    Review of Systems  All other systems reviewed and are negative.       Objective:  Physical Exam  Constitutional: He appears well-developed and well-nourished.  Cardiovascular: Intact distal pulses.   Pulmonary/Chest: Breath sounds normal.  Musculoskeletal: Normal range of motion.  Neurological: He is alert.  Skin: Skin is warm.  Nursing note and vitals reviewed.  neurovascular status found to be intact muscle strength was adequate range of motion within normal limits with patient noted to have a very painful left heel at the insertional point of the tendon into the calcaneus with inflammation and fluid around the medial band. Patient does have slight pain also within the calcaneal bone itself medial side but the vast majority of pain is plantar     Assessment:   Probability for acute plantar fasciitis left but cannot rule out possibility for stress fracture      Plan:    H&P condition reviewed and today I injected the plantar fascial left 3 mg Kenalog 5 mg Xylocaine advised on ice therapy and that if symptoms do not improve rapidly we'll need to put a cast on this in order to immobilize. Patient be seen back 2 weeks or earlier if needed  X-rays indicated small spur with no indication of stress fracture

## 2017-01-24 NOTE — Progress Notes (Signed)
   Subjective:    Patient ID: [name], male    DOB: 07-20-1931, 83 y.o.   MRN: [id number]  HPI Chief Complaint  Patient presents with  . Foot Pain    Left foot; bottom of heel; pt stated, "foot hurts all day and night"; x1 week; Pt Diabetic Type 1; Sugar=136 this am; A1C=6.1      Review of Systems  Eyes: Positive for visual disturbance.  Musculoskeletal: Positive for back pain and gait problem.  Hematological: Bruises/bleeds easily.  All other systems reviewed and are negative.      Objective:   Physical Exam        Assessment & Plan:

## 2017-01-28 ENCOUNTER — Ambulatory Visit (INDEPENDENT_AMBULATORY_CARE_PROVIDER_SITE_OTHER): Payer: Medicare Other | Admitting: Podiatry

## 2017-01-28 ENCOUNTER — Encounter: Payer: Self-pay | Admitting: Podiatry

## 2017-01-28 DIAGNOSIS — M84374A Stress fracture, right foot, initial encounter for fracture: Secondary | ICD-10-CM | POA: Diagnosis not present

## 2017-01-28 DIAGNOSIS — I701 Atherosclerosis of renal artery: Secondary | ICD-10-CM | POA: Diagnosis not present

## 2017-01-28 DIAGNOSIS — M722 Plantar fascial fibromatosis: Secondary | ICD-10-CM | POA: Diagnosis not present

## 2017-01-29 NOTE — Progress Notes (Signed)
Subjective:    Patient ID: [name], male   DOB: 83 y.o.   MRN: [id number]   HPI patient states she still having tremendous discomfort in the heel bone left and states the plantar scaling some better but the bone itself is very tender    ROS      Objective:  Physical Exam neurovascular status intact negative Homans sign was noted with patient's left calcaneus itself being quite sore in the plantar fascial being improved from previous visit. There is discomfort when I pressed medial lateral direction     Assessment:  Strong possibility for calcaneal stress fracture versus pure plantar fascial symptomatology     Plan:   H&P condition reviewed and at this point I went ahead and due to the fact that there is what appears to be stress fracture I did apply a air fracture walker for complete immobilization he stated it felt better when he walked on

## 2017-02-07 ENCOUNTER — Ambulatory Visit: Payer: Medicare Other | Admitting: Podiatry

## 2017-02-11 DIAGNOSIS — J440 Chronic obstructive pulmonary disease with acute lower respiratory infection: Secondary | ICD-10-CM | POA: Diagnosis not present

## 2017-02-11 DIAGNOSIS — Z6830 Body mass index (BMI) 30.0-30.9, adult: Secondary | ICD-10-CM | POA: Diagnosis not present

## 2017-02-11 DIAGNOSIS — R05 Cough: Secondary | ICD-10-CM | POA: Diagnosis not present

## 2017-02-14 ENCOUNTER — Telehealth: Payer: Self-pay | Admitting: Cardiology

## 2017-02-14 ENCOUNTER — Encounter: Payer: Self-pay | Admitting: Student

## 2017-02-14 ENCOUNTER — Ambulatory Visit (INDEPENDENT_AMBULATORY_CARE_PROVIDER_SITE_OTHER): Payer: Medicare Other | Admitting: Student

## 2017-02-14 VITALS — BP 120/52 | HR 84 | Ht 72.0 in | Wt 214.0 lb

## 2017-02-14 DIAGNOSIS — I481 Persistent atrial fibrillation: Secondary | ICD-10-CM

## 2017-02-14 DIAGNOSIS — Z7901 Long term (current) use of anticoagulants: Secondary | ICD-10-CM | POA: Diagnosis not present

## 2017-02-14 DIAGNOSIS — I350 Nonrheumatic aortic (valve) stenosis: Secondary | ICD-10-CM | POA: Diagnosis not present

## 2017-02-14 DIAGNOSIS — I25810 Atherosclerosis of coronary artery bypass graft(s) without angina pectoris: Secondary | ICD-10-CM

## 2017-02-14 DIAGNOSIS — J449 Chronic obstructive pulmonary disease, unspecified: Secondary | ICD-10-CM | POA: Diagnosis not present

## 2017-02-14 DIAGNOSIS — I701 Atherosclerosis of renal artery: Secondary | ICD-10-CM

## 2017-02-14 DIAGNOSIS — I10 Essential (primary) hypertension: Secondary | ICD-10-CM | POA: Diagnosis not present

## 2017-02-14 DIAGNOSIS — E7800 Pure hypercholesterolemia, unspecified: Secondary | ICD-10-CM | POA: Diagnosis not present

## 2017-02-14 DIAGNOSIS — I4819 Other persistent atrial fibrillation: Secondary | ICD-10-CM

## 2017-02-14 MED ORDER — LEVALBUTEROL HCL 0.63 MG/3ML IN NEBU: 0.6300 mg | INHALATION_SOLUTION | RESPIRATORY_TRACT | 12 refills | Status: DC | PRN

## 2017-02-14 NOTE — Patient Instructions (Addendum)
Medication Instructions: START Levalbuterol (Xopenex) every four hours as needed. STOP Duoneb You may take an extra Metoprolol (Lopressor) as needed for palpitations.   If you need a refill on your cardiac medications before your next appointment, please call your pharmacy.    Follow-Up: Your physician wants you to follow-up in: February with Dr. Stanford Breed.You will receive a reminder letter in the mail two months in advance. If you don't receive a letter, please call our office to schedule this follow-up appointment.   Thank you for choosing [facility] at [facility]!!

## 2017-02-14 NOTE — Telephone Encounter (Signed)
New message    Pt wife is calling asking for a call back.   Patient c/o Palpitations:  High priority if patient c/o lightheadedness and shortness of breath.  1. How long have you been having palpitations? Two days  2. Are you currently experiencing lightheadedness and shortness of breath? Yes per wife.  3. Have you checked your BP and heart rate? (document readings) 111/54 p-118 today  4. Are you experiencing any other symptoms? Slight pain in his heart and into his back per wife.

## 2017-02-14 NOTE — Progress Notes (Addendum)
Cardiology Office Note    Date:  02/14/2017   ID:  [name], DOB 11-23-1931, MRN [id number]  PCP:  Nicoletta Dress, MD  Cardiologist: Dr. Stanford Breed  Chief Complaint  Patient presents with  . Palpitations    History of Present Illness:    [name] is a 83 y.o. male with past medical history of CAD (s/p CABG in 1994 with cath in 2015 showing normal LM, 100% LCx and RCA stenosis with 50-60% prox LAD stenosis and 100% mid-LAD stenosis; patent sequential SVG-OM2-OM3 and patent LIMA-LAD with PTCA of distal LAD via LIMA graft performed at that time), severe AS (by echo in 09/2016), permanent atrial fibrillation (on Pradaxa), COPD, HTN, HLD, and carotid artery disease who presents to the office today for evaluation of palpitations.   He was last examined by Dr. Stanford Breed in 01/2017 and reported doing well from a cardiac perspective at that time. It was recommended to perform a repeat echocardiogram in 6 months to reassess valve gradients as he was asymptomatic with his AS at that time.   He called the office earlier today reporting palpitations and dyspnea with an elevated heart rate in the 110's, therefore he was added on for an acute visit.  In talking with the patient and his wife today, he reports being diagnosed with acute bronchitis approximately 3 weeks ago. He was initially prescribed Levaquin but this was switched to Azithromycin. During this time frame, he has started utilizing his DuoNeb nebulizer 3-4 times per day as compared to only using it 1-2 times per day prior to his acute exacerbation. He has continued to use his inhaler twice daily which he has been doing for several years per his report.  Starting yesterday, he noticed palpitations which would occur at rest or with activity and spontaneously resolve. His wife who is a retired Marine scientist checked his heart rate throughout the day and reports it was variable in the 60's to 110's. The highest number she obtained was 118  this morning. Blood pressure has been soft in the low 100's/60's when checked at home. He denies any associated lightheadedness, dizziness, or presyncope. No recent chest pain, orthopnea, PND, or lower extremity edema. Does use supplemental oxygen at night (2L [place]).   He reports good compliance with his medications and denies missing any recent doses of Pradaxa. No recent melena, hematochezia, or hematuria.    Past Medical History:  Diagnosis Date  . Acute myocardial infarction, unspecified site, initial episode of care   . Anemia   . Anxiety   . Aortic stenosis    . Atrial fibrillation ([place])    a. permanent, on Coumadin for anticoagulation  . CAD (coronary artery disease)    a. s/p CABG in 1994 b. cath in 2015 showing normal LM, 100% LCx and RCA stenosis with 50-60% prox LAD stenosis and 100% mid-LAD stenosis; patent sequential SVG-OM2-OM3 and patent LIMA-LAD with PTCA of distal LAD via LIMA graft performed at that time  . Carotid stenosis   . CHF (congestive heart failure) ([place])   . COPD (chronic obstructive pulmonary disease) ([place])   . Depression   . Diabetes mellitus without complication ([place])    FASTING 98-120S  . GERD (gastroesophageal reflux disease)   . History of blood transfusion   . History of peptic ulcer disease   . Hyperlipidemia   . Hypertension   . Myocardial infarction ([place])    X2  . Peripheral vascular disease ([place])   . Pneumonia  HX OF  . Renal artery stenosis ([place])   . Sleep apnea    OXYGEN AT NIGHT 2L [place]    Past Surgical History:  Procedure Laterality Date  . CAROTID ENDARTERECTOMY    . CORONARY ARTERY BYPASS GRAFT  1994  . ENDARTERECTOMY Right 01/05/2013   Procedure: ENDARTERECTOMY CAROTID;  Surgeon: Rosetta Posner, MD;  Location: [facility];  Service: Vascular;  Laterality: Right;  . KNEE SURGERY  08/2003   left knee/ARTHROSCOPIC  . LEFT HEART CATHETERIZATION WITH CORONARY/GRAFT ANGIOGRAM N/A 09/01/2013   Procedure: LEFT HEART CATHETERIZATION WITH  Beatrix Fetters;  Surgeon: Sinclair Grooms, MD;  Location: [facility] CATH LAB;  Service: Cardiovascular;  Laterality: N/A;  . PATCH ANGIOPLASTY Right 01/05/2013   Procedure: PATCH ANGIOPLASTY;  Surgeon: Rosetta Posner, MD;  Location: [facility];  Service: Vascular;  Laterality: Right;  . PERCUTANEOUS CORONARY STENT INTERVENTION (PCI-S)  09/01/2013   Procedure: PERCUTANEOUS CORONARY STENT INTERVENTION (PCI-S);  Surgeon: Sinclair Grooms, MD;  Location: [facility] CATH LAB;  Service: Cardiovascular;;  . removal of bleeding ulcer  1994  . RENAL ARTERY STENT     stenting of the left renal artery as well as a cutting balloon angioplasty for treatment of in-stent restenosis coronary artery bypass grafting in 1994.    Current Medications: Outpatient Medications Prior to Visit  Medication Sig Dispense Refill  . acetaminophen (TYLENOL) 500 MG tablet Take 500 mg by mouth every 6 (six) hours as needed for pain.     [name] acyclovir (ZOVIRAX) 800 MG tablet Take 400 mg by mouth 2 (two) times daily.     [name] albuterol (PROVENTIL,VENTOLIN) 90 MCG/ACT inhaler Inhale 2 puffs into the lungs every 4 (four) hours as needed for shortness of breath.     [name] atorvastatin (LIPITOR) 80 MG tablet Take 1 tablet (80 mg total) by mouth at bedtime. 90 tablet 3  . azelastine (ASTELIN) 137 MCG/SPRAY nasal spray Place 2 sprays into both nostrils daily.     . brimonidine (ALPHAGAN P) 0.1 % SOLN Place 1 drop into both eyes 2 (two) times daily.    . Cholecalciferol (VITAMIN D) 2000 UNITS CAPS Take 2,000 Units by mouth daily.    . dabigatran (PRADAXA) 150 MG CAPS Take 150 mg by mouth 2 (two) times daily.     . ferrous sulfate 325 (65 FE) MG tablet Take 325 mg by mouth daily with breakfast.     . furosemide (LASIX) 20 MG tablet Take 20 mg by mouth daily.      . lansoprazole (PREVACID) 30 MG capsule Take 30 mg by mouth daily.      . metoprolol (LOPRESSOR) 50 MG tablet Take 1 tablet (50 mg total) by mouth 2 (two) times daily. 180 tablet 4  . Multiple  Vitamin (MULTIVITAMIN WITH MINERALS) TABS Take 1 tablet by mouth daily.    . multivitamin-lutein (OCUVITE-LUTEIN) CAPS Take 1 capsule by mouth daily.    . predniSONE (DELTASONE) 20 MG tablet Take 20 mg by mouth as needed.     . terazosin (HYTRIN) 5 MG capsule Take 5 mg by mouth at bedtime.     . trolamine salicylate (ASPERCREME) 10 % cream Apply 1 application topically 4 (four) times daily as needed (pain).     [name] ipratropium-albuterol (DUONEB) 0.5-2.5 (3) MG/3ML SOLN Take 3 mLs by nebulization 3 (three) times daily as needed (for bronchitis or other virus that affects breathing).      No facility-administered medications prior to visit.  Allergies:   Bactrim [sulfamethoxazole-trimethoprim] and Levaquin [levofloxacin in d5w]   Social History   Social History  . Marital status: Married    Spouse name: N/A  . Number of children: N/A  . Years of education: N/A   Social History Main Topics  . Smoking status: Former Smoker    Quit date: 04/27/1993  . Smokeless tobacco: Never Used  . Alcohol use No  . Drug use: No  . Sexual activity: Yes   Other Topics Concern  . None   Social History Narrative   Social History:   Married    Tobacco Use - No.    Alcohol Use - yes         Family History:   Mother died at age 8 of coronary disease.  Father died at 32 of coronary disease.  He has 13 siblings, almost all of whom have coronary disease and some with premature onset.     Family History:  The patient's family history includes Coronary artery disease in his father, mother, and unknown relative; Heart attack in his father and mother; Heart disease in his brother, father, mother, sister, and unknown relative; Hyperlipidemia in his mother; Hypertension in his mother.   Review of Systems:   Please see the history of present illness.     General:  No chills, fever, night sweats or weight changes.  Cardiovascular:  No chest pain, dyspnea on exertion, edema, orthopnea, paroxysmal  nocturnal dyspnea. Positive for palpitations.  Dermatological: No rash, lesions/masses Respiratory: No cough, Positive for baseline dyspnea. Urologic: No hematuria, dysuria Abdominal:   No nausea, vomiting, diarrhea, bright red blood per rectum, melena, or hematemesis Neurologic:  No visual changes, wkns, changes in mental status. All other systems reviewed and are otherwise negative except as noted above.   Physical Exam:    VS:  BP (!) 120/52   Pulse 84   Ht 6' (1.829 m)   Wt 214 lb (97.1 kg)   BMI 29.02 kg/m    General: Well developed, well nourished Caucasian male appearing in no acute distress. Head: Normocephalic, atraumatic, sclera non-icteric, no xanthomas, nares are without discharge.  Neck: No carotid bruits. JVD not elevated.  Lungs: Respirations regular and unlabored, without wheezes or rales.  Heart: Irregularly irregular. No S3 or S4.  No rubs or gallops appreciated. 3/6 SEM appreciated along RUSB. Abdomen: Soft, non-tender, non-distended with normoactive bowel sounds. No hepatomegaly. No rebound/guarding. No obvious abdominal masses. Msk:  Strength and tone appear normal for age. No joint deformities or effusions. Extremities: No clubbing or cyanosis. No edema.  Distal pedal pulses are 2+ bilaterally. Neuro: Alert and oriented X 3. Moves all extremities spontaneously. No focal deficits noted. Psych:  Responds to questions appropriately with a normal affect. Skin: No rashes or lesions noted  Wt Readings from Last 3 Encounters:  02/14/17 214 lb (97.1 kg)  01/17/17 215 lb (97.5 kg)  10/09/16 219 lb (99.3 kg)     Studies/Labs Reviewed:   EKG:  EKG is ordered today. The ekg ordered today demonstrates atrial fibrillation with PVC's, HR 84. No acute ST or T-wave changes when compared to prior tracings.   Recent Labs: No results found for requested labs within last 8760 hours.   Lipid Panel    Component Value Date/Time   CHOL 142 11/17/2012 0909   TRIG 90.0  11/17/2012 0909   HDL 46.80 11/17/2012 0909   CHOLHDL 3 11/17/2012 0909   VLDL 18.0 11/17/2012 0909   LDLCALC 77 11/17/2012 0909  LDLDIRECT 67.6 09/08/2009 0000    Additional studies/ records that were reviewed today include:   Cardiac Catheterization: 08/2013  ANGIOGRAPHIC DATA:   The left main coronary artery is widely patent.  The left anterior descending artery is  Patent and in the mid vessel appears to be totally occluded after the large first diagonal. The proximal LAD contains irregularities with up to 50-60% stenosis.  The left circumflex artery is essentially totally occluded in the mid vessel 2 small obtuse marginal branches arise from the proximal circumflex. The distal vessel is supplied by a patent saphenous vein graft.  The right coronary artery is is totally occluded. The distal vessel fills faintly by left to right collaterals. The right coronary appears to be nondominant.   BYPASS GRAFT ANGIOGRAPHY: The saphenous vein graft sequential to the second and third obtuse marginal is widely patent  The left internal mammary graft to the mid LAD is widely patent  PCI RESULTS: PTCA of the distal LAD via the left internal mammary with a 2.25 x 15 balloon resulted in a less than 40% stenosis and TIMI grade 3 flow. No dissection or complication occurred. We chose not to stent to to the distal location, the small vessel size, and complicating issues associated with triple drug (anticoagulation plus dual antiplatelet therapy)  LEFT VENTRICULOGRAM:  Left ventricular angiogram was done in the 30 RAO projection and revealed inferoapical severe hypokinesis. EF 45%   IMPRESSIONS:  1. Acute coronary syndrome due to high-grade stenosis in the apical LAD. 2. Successful angioplasty of the apical LAD via the internal mammary artery. Stenting was not performed as explained above. 3. Patent saphenous vein sequential graft to obtuse marginal 2 and 3. 4. Patent left internal mammary  graft to the LAD 5. Total occlusion of the native right coronary. The right coronary appears to be a nondominant vessel 6. Total occlusion of the native circumflex in the midsegment 7. Inferoapical wall motion abnormality compatible with the high-grade stenosis noted in the apical LAD. EF is 40-45%   RECOMMENDATION:  Aggrastat therapy x12 hours In a.m. resume aspirin 81 mg daily and Pradaxa Patient is eligible for discharge in a.m. if no complications..  Echocardiogram: 09/2016 Study Conclusions  - Left ventricle: The cavity size was normal. There was severe   concentric hypertrophy. Systolic function was normal. The   estimated ejection fraction was in the range of 60% to 65%. Wall   motion was normal; there were no regional wall motion   abnormalities. The study is not technically sufficient to allow   evaluation of LV diastolic function. - Aortic valve: Calcified leaflets. Severe stenosis. There was mild   regurgitation. Mean gradient (S): 46 mm Hg. Peak gradient (S): 88   mm Hg. Valve area (VTI): 0.68 cm^2. Valve area (Vmax): 0.74 cm^2.   Valve area (Vmean): 0.66 cm^2. - Mitral valve: MAC. Moderate stenosis. There was mild   regurgitation. Mean gradient (D): 9 mm Hg. Peak gradient (D): 17   mm Hg. - Left atrium: Severely dilated >70 ml/m2. - Right atrium: Moderately dilated. - Tricuspid valve: There was moderate regurgitation. - Pulmonary arteries: PA peak pressure: 43 mm Hg (S). - Inferior vena cava: The vessel was dilated. The respirophasic   diameter changes were blunted (< 50%), consistent with elevated   central venous pressure.  Impressions:  - Compared to a prior study in 2017, there is now severe aortic   stenosis and moderate mitral stenosis. LVEF is higher at 60-65%.   Assessment:  1. Coronary artery disease involving coronary bypass graft of native heart without angina pectoris   2. Persistent atrial fibrillation ([place])   3. Chronic anticoagulation     4. Nonrheumatic aortic valve stenosis   5. Essential hypertension, benign   6. Pure hypercholesterolemia   7. Chronic obstructive pulmonary disease, unspecified COPD type ([place])      Plan:   In order of problems listed above:  1. CAD - s/p CABG in 1994 with cath in 2015 showing normal LM, 100% LCx and RCA stenosis with 50-60% prox LAD stenosis and 100% mid-LAD stenosis; patent sequential SVG-OM2-OM3 and patent LIMA-LAD with PTCA of distal LAD via LIMA graft performed at that time. - he denies any recent exertional chest pain or dyspnea on exertion. Did report having a 2-3 second "sensation" along his chest yesterday which occurred while his HR was elevated. EKG is without acute ischemic changes.  - continue BB and statin therapy. No ASA secondary to the need for Pradaxa.   2. Persistent Atrial Fibrillation/ Chronic Anticoagulation - has experienced palpitations and mild dyspnea over the past 2 days. HR has been variable in the 60's to 110's when checked at home and BP in the low-100's/60's. - He remains on Lopressor 32m BID. Will not increase his baseline dosing as BP is often borderline. Recommended to the patient he could take an additional tablet as needed for palpitations. The increased use of Albuterol is likely causing rebound tachycardia, therefore I recommended this be switched to Xopenex. Rx was provided today and [facility] Labs not checked today as his wife wishes to wait [facility] Results from 12/2016 reviewed with stable Hgb and creatinine at that time.  - he denies any evidence of active bleeding. Continue Pradaxa 153mBID for anticoagulation.   3. Aortic Stenosis - echo in 09/2016 showed severe stenosis with a mean gradient (S): 46 mm Hg and peak gradient (S): 88 mm Hg. - he denies any recent lightheadedness, dizziness, or presyncope. Has experienced mild dyspnea in the setting of atrial fibrillation.   - plan  for repeat echocardiogram every 6 months. The patient and his wife are aware of the potential need for TAVR in the future as he would not be a good candidate for conventional valve replacement with history of CABG.   4. HTN - BP is well-controlled at 120/52 during today's visit. - continue Lopressor 507mID.   5. HLD - LDL at 56 when checked by PCP in 12/2016. At goal of < 70 71th known CAD.  - continue Atorvastatin 12m17mily.   6. COPD - currently using Albuterol inhaler and nebulizer. Has been using the inhaler for years but recently started using the nebulizer. Recommended switching to Xopenex to avoid rebound tachycardia.    Medication Adjustments/Labs and Tests Ordered: Current medicines are reviewed at length with the patient today.  Concerns regarding medicines are outlined above.  Medication changes, Labs and Tests ordered today are listed in the Patient Instructions below. Patient Instructions  Medication Instructions: START Levalbuterol (Xopenex) every four hours as needed. STOP Duoneb You may take an extra Metoprolol (Lopressor) as needed for palpitations.   If you need a refill on your cardiac medications before your next appointment, please call your pharmacy.    Follow-Up: Your physician wants you to follow-up in: February with Dr. CrenStanford Breed will receive a reminder letter in the mail two months in advance. If you don't receive a  letter, please call our office to schedule this follow-up appointment.   Thank you for choosing [facility]!!      Signed, Erma Heritage, PA-C  02/14/2017 3:28 PM    [facility] [address], [place], [place]  [address] Phone: [phone]; Fax: [phone]  [address], [place], [place] [address] Phone: [phone]

## 2017-02-14 NOTE — Telephone Encounter (Signed)
Returned call to patient's wife.She stated husband has been having fast heart beat off on and on for the past 2 days.Stated at present pulse 103 B/P 102/52.No chest pain at present.Stated he had chest pain earlier this week.Stated she would like appointment to be seen today.Appointment scheduled with Bernerd Pho PA this morning at 10:30 am.

## 2017-02-18 ENCOUNTER — Ambulatory Visit: Payer: Medicare Other | Admitting: Podiatry

## 2017-02-25 ENCOUNTER — Ambulatory Visit (INDEPENDENT_AMBULATORY_CARE_PROVIDER_SITE_OTHER): Payer: Medicare Other

## 2017-02-25 ENCOUNTER — Ambulatory Visit (INDEPENDENT_AMBULATORY_CARE_PROVIDER_SITE_OTHER): Payer: Medicare Other | Admitting: Podiatry

## 2017-02-25 ENCOUNTER — Encounter: Payer: Self-pay | Admitting: Podiatry

## 2017-02-25 DIAGNOSIS — M84374D Stress fracture, right foot, subsequent encounter for fracture with routine healing: Secondary | ICD-10-CM | POA: Diagnosis not present

## 2017-02-25 DIAGNOSIS — I701 Atherosclerosis of renal artery: Secondary | ICD-10-CM

## 2017-02-26 NOTE — Progress Notes (Signed)
Subjective:    Patient ID: [name], male   DOB: 83 y.o.   MRN: [id number]   HPI patient presents with caregiver stating he seems to be improved from previous    ROS      Objective:  Physical Exam neurovascular status intact with patient's left foot improved over previous with the heel bone still sore but quite a bit better than previous     Assessment:    Probability for stress fracture left calcaneus which has responded well to immobilization     Plan:    H&P x-ray reviewed and advised him gradual reduction of the air fracture walker and wearing of good supportive shoes and not going barefoot. If symptoms persist reappoint  X-rays indicate possibility for a mild stress fracture left calcaneus with not conclusive information on the x-ray

## 2017-03-20 ENCOUNTER — Encounter (HOSPITAL_COMMUNITY): Payer: Medicare Other

## 2017-03-26 DIAGNOSIS — Z6830 Body mass index (BMI) 30.0-30.9, adult: Secondary | ICD-10-CM | POA: Diagnosis not present

## 2017-03-26 DIAGNOSIS — I2510 Atherosclerotic heart disease of native coronary artery without angina pectoris: Secondary | ICD-10-CM | POA: Diagnosis not present

## 2017-03-26 DIAGNOSIS — E785 Hyperlipidemia, unspecified: Secondary | ICD-10-CM | POA: Diagnosis not present

## 2017-03-26 DIAGNOSIS — D509 Iron deficiency anemia, unspecified: Secondary | ICD-10-CM | POA: Diagnosis not present

## 2017-03-26 DIAGNOSIS — I4891 Unspecified atrial fibrillation: Secondary | ICD-10-CM | POA: Diagnosis not present

## 2017-03-26 DIAGNOSIS — E1142 Type 2 diabetes mellitus with diabetic polyneuropathy: Secondary | ICD-10-CM | POA: Diagnosis not present

## 2017-03-26 DIAGNOSIS — M109 Gout, unspecified: Secondary | ICD-10-CM | POA: Diagnosis not present

## 2017-03-26 DIAGNOSIS — I10 Essential (primary) hypertension: Secondary | ICD-10-CM | POA: Diagnosis not present

## 2017-03-27 ENCOUNTER — Other Ambulatory Visit: Payer: Self-pay | Admitting: *Deleted

## 2017-03-27 DIAGNOSIS — I701 Atherosclerosis of renal artery: Secondary | ICD-10-CM

## 2017-03-28 ENCOUNTER — Ambulatory Visit (HOSPITAL_COMMUNITY)
Admission: RE | Admit: 2017-03-28 | Discharge: 2017-03-28 | Disposition: A | Discharge status: Home / Self Care | Payer: Medicare Other | Source: Ambulatory Visit | Attending: Cardiovascular Disease | Admitting: Cardiovascular Disease

## 2017-03-28 DIAGNOSIS — I679 Cerebrovascular disease, unspecified: Secondary | ICD-10-CM | POA: Diagnosis not present

## 2017-04-01 ENCOUNTER — Telehealth: Payer: Self-pay | Admitting: *Deleted

## 2017-04-01 DIAGNOSIS — I739 Peripheral vascular disease, unspecified: Secondary | ICD-10-CM

## 2017-04-01 DIAGNOSIS — I6523 Occlusion and stenosis of bilateral carotid arteries: Secondary | ICD-10-CM

## 2017-04-01 DIAGNOSIS — I679 Cerebrovascular disease, unspecified: Secondary | ICD-10-CM

## 2017-04-01 NOTE — Telephone Encounter (Signed)
-----   Message from Lelon Perla, MD sent at 03/31/2017  6:57 PM EDT ----- Fu study 1 year Kirk Ruths

## 2017-04-01 NOTE — Telephone Encounter (Signed)
Spoke to patient's wife. Result given . Verbalized understanding  6 month visit schedule. Ordered f/u carotid in 12 months.

## 2017-04-17 DIAGNOSIS — H348112 Central retinal vein occlusion, right eye, stable: Secondary | ICD-10-CM | POA: Diagnosis not present

## 2017-04-17 DIAGNOSIS — H43813 Vitreous degeneration, bilateral: Secondary | ICD-10-CM | POA: Diagnosis not present

## 2017-04-17 DIAGNOSIS — H3582 Retinal ischemia: Secondary | ICD-10-CM | POA: Diagnosis not present

## 2017-05-08 DIAGNOSIS — L299 Pruritus, unspecified: Secondary | ICD-10-CM | POA: Diagnosis not present

## 2017-05-08 DIAGNOSIS — L219 Seborrheic dermatitis, unspecified: Secondary | ICD-10-CM | POA: Diagnosis not present

## 2017-05-08 DIAGNOSIS — L309 Dermatitis, unspecified: Secondary | ICD-10-CM | POA: Diagnosis not present

## 2017-05-29 DIAGNOSIS — J019 Acute sinusitis, unspecified: Secondary | ICD-10-CM | POA: Diagnosis not present

## 2017-05-29 DIAGNOSIS — J440 Chronic obstructive pulmonary disease with acute lower respiratory infection: Secondary | ICD-10-CM | POA: Diagnosis not present

## 2017-06-03 DIAGNOSIS — J440 Chronic obstructive pulmonary disease with acute lower respiratory infection: Secondary | ICD-10-CM | POA: Diagnosis not present

## 2017-06-03 DIAGNOSIS — J019 Acute sinusitis, unspecified: Secondary | ICD-10-CM | POA: Diagnosis not present

## 2017-06-05 DIAGNOSIS — L700 Acne vulgaris: Secondary | ICD-10-CM | POA: Diagnosis not present

## 2017-06-05 DIAGNOSIS — L219 Seborrheic dermatitis, unspecified: Secondary | ICD-10-CM | POA: Diagnosis not present

## 2017-07-05 DIAGNOSIS — Z6831 Body mass index (BMI) 31.0-31.9, adult: Secondary | ICD-10-CM | POA: Diagnosis not present

## 2017-07-05 DIAGNOSIS — D509 Iron deficiency anemia, unspecified: Secondary | ICD-10-CM | POA: Diagnosis not present

## 2017-07-05 DIAGNOSIS — E1142 Type 2 diabetes mellitus with diabetic polyneuropathy: Secondary | ICD-10-CM | POA: Diagnosis not present

## 2017-07-05 DIAGNOSIS — I2510 Atherosclerotic heart disease of native coronary artery without angina pectoris: Secondary | ICD-10-CM | POA: Diagnosis not present

## 2017-07-05 DIAGNOSIS — I10 Essential (primary) hypertension: Secondary | ICD-10-CM | POA: Diagnosis not present

## 2017-07-05 DIAGNOSIS — E785 Hyperlipidemia, unspecified: Secondary | ICD-10-CM | POA: Diagnosis not present

## 2017-07-05 DIAGNOSIS — M109 Gout, unspecified: Secondary | ICD-10-CM | POA: Diagnosis not present

## 2017-07-05 DIAGNOSIS — I4891 Unspecified atrial fibrillation: Secondary | ICD-10-CM | POA: Diagnosis not present

## 2017-07-11 NOTE — Progress Notes (Signed)
HPI:FU coronary artery disease, status post coronary artery bypassing graft, permanent atrial fibrillation, AS and peripheral vascular disease (renal artery stenosis and cerebrovascular disease). Pt had right carotid endarterectomy in August of 2014. Cath 3/15 showed EF 45, normal LM, 100 LAD, Lcx and RCA; sequential SVG to OM2 and OM3 patent, LIMA to LAD patent. Patient had PTCA of distal LAD via LIMA graft. Last echo 4/18 showed normal LV function; severe AS with mean gradient 46 mmHg, moderate mitral stenosis, mild MR, biatrial enlargement; moderate TR. Ultrasound 4/18 showed dilated aorta (2.3 x 2.4 cm), bilateral 1-59 RAS; patent left renal artery stent.  Carotid Dopplers October 2018 showed less than 50% stenosis on the right and 40-59% stenosis on the left.  Follow-up 1 year.  Since last seen  patient notes some increased dyspnea on exertion but denies orthopnea, PND, palpitations, syncope or chest pain.  Chronic mild pedal edema.  Current Outpatient Medications  Medication Sig Dispense Refill  . acetaminophen (TYLENOL) 500 MG tablet Take 500 mg by mouth every 6 (six) hours as needed for pain.     [name] acyclovir (ZOVIRAX) 800 MG tablet Take 400 mg by mouth 2 (two) times daily.     [name] albuterol (PROVENTIL,VENTOLIN) 90 MCG/ACT inhaler Inhale 2 puffs into the lungs every 4 (four) hours as needed for shortness of breath.     [name] atorvastatin (LIPITOR) 80 MG tablet Take 1 tablet (80 mg total) by mouth at bedtime. 90 tablet 3  . brimonidine (ALPHAGAN P) 0.1 % SOLN Place 1 drop into both eyes 2 (two) times daily.    . Cholecalciferol (VITAMIN D) 2000 UNITS CAPS Take 2,000 Units by mouth daily.    . dabigatran (PRADAXA) 150 MG CAPS Take 150 mg by mouth 2 (two) times daily.     . ferrous sulfate 325 (65 FE) MG tablet Take 325 mg by mouth daily with breakfast.     . furosemide (LASIX) 20 MG tablet Take 20 mg by mouth daily.      [name] ipratropium-albuterol (DUONEB) 0.5-2.5 (3) MG/3ML SOLN Take 3 mLs by  nebulization as directed.    . lansoprazole (PREVACID) 30 MG capsule Take 30 mg by mouth daily.      . metoprolol (LOPRESSOR) 50 MG tablet Take 1 tablet (50 mg total) by mouth 2 (two) times daily. 180 tablet 4  . Multiple Vitamin (MULTIVITAMIN WITH MINERALS) TABS Take 1 tablet by mouth daily.    . multivitamin-lutein (OCUVITE-LUTEIN) CAPS Take 1 capsule by mouth daily.    . predniSONE (DELTASONE) 20 MG tablet Take 20 mg by mouth as needed.     . terazosin (HYTRIN) 5 MG capsule Take 5 mg by mouth at bedtime.     . trolamine salicylate (ASPERCREME) 10 % cream Apply 1 application topically 4 (four) times daily as needed (pain).      No current facility-administered medications for this visit.      Past Medical History:  Diagnosis Date  . Acute myocardial infarction, unspecified site, initial episode of care   . Anemia   . Anxiety   . Aortic stenosis    . Atrial fibrillation ([place])    a. permanent, on Coumadin for anticoagulation  . CAD (coronary artery disease)    a. s/p CABG in 1994 b. cath in 2015 showing normal LM, 100% LCx and RCA stenosis with 50-60% prox LAD stenosis and 100% mid-LAD stenosis; patent sequential SVG-OM2-OM3 and patent LIMA-LAD with PTCA of distal LAD via LIMA graft performed at  that time  . Carotid stenosis   . CHF (congestive heart failure) ([place])   . COPD (chronic obstructive pulmonary disease) ([place])   . Depression   . Diabetes mellitus without complication ([place])    FASTING 98-120S  . GERD (gastroesophageal reflux disease)   . History of blood transfusion   . History of peptic ulcer disease   . Hyperlipidemia   . Hypertension   . Myocardial infarction ([place])    X2  . Peripheral vascular disease ([place])   . Pneumonia    HX OF  . Renal artery stenosis ([place])   . Sleep apnea    OXYGEN AT NIGHT 2L [place]    Past Surgical History:  Procedure Laterality Date  . CAROTID ENDARTERECTOMY    . CORONARY ARTERY BYPASS GRAFT  1994  . ENDARTERECTOMY Right 01/05/2013    Procedure: ENDARTERECTOMY CAROTID;  Surgeon: Rosetta Posner, MD;  Location: [facility];  Service: Vascular;  Laterality: Right;  . KNEE SURGERY  08/2003   left knee/ARTHROSCOPIC  . LEFT HEART CATHETERIZATION WITH CORONARY/GRAFT ANGIOGRAM N/A 09/01/2013   Procedure: LEFT HEART CATHETERIZATION WITH Beatrix Fetters;  Surgeon: Sinclair Grooms, MD;  Location[facility] CATH LAB;  Service: Cardiovascular;  Laterality: N/A;  . PATCH ANGIOPLASTY Right 01/05/2013   Procedure: PATCH ANGIOPLASTY;  Surgeon: Rosetta Posner, MD;  Location: [facility];  Service: Vascular;  Laterality: Right;  . PERCUTANEOUS CORONARY STENT INTERVENTION (PCI-S)  09/01/2013   Procedure: PERCUTANEOUS CORONARY STENT INTERVENTION (PCI-S);  Surgeon: Sinclair Grooms, MD;  Location: [facility] CATH LAB;  Service: Cardiovascular;;  . removal of bleeding ulcer  1994  . RENAL ARTERY STENT     stenting of the left renal artery as well as a cutting balloon angioplasty for treatment of in-stent restenosis coronary artery bypass grafting in 1994.    Social History   Socioeconomic History  . Marital status: Married    Spouse name: Not on file  . Number of children: Not on file  . Years of education: Not on file  . Highest education level: Not on file  Social Needs  . Financial resource strain: Not on file  . Food insecurity - worry: Not on file  . Food insecurity - inability: Not on file  . Transportation needs - medical: Not on file  . Transportation needs - non-medical: Not on file  Occupational History  . Not on file  Tobacco Use  . Smoking status: Former Smoker    Last attempt to quit: 04/27/1993    Years since quitting: 24.2  . Smokeless tobacco: Never Used  Substance and Sexual Activity  . Alcohol use: No    Alcohol/week: 0.0 oz  . Drug use: No  . Sexual activity: Yes  Other Topics Concern  . Not on file  Social History Narrative   Social History:   Married    Tobacco Use - No.    Alcohol Use - yes         Family History:    Mother died at age 76 of coronary disease.  Father died at 13 of coronary disease.  He has 13 siblings, almost all of whom have coronary disease and some with premature onset.    Family History  Problem Relation Age of Onset  . Coronary artery disease Mother   . Heart disease Mother        Before age 12  . Hyperlipidemia Mother   . Hypertension Mother   . Heart attack Mother   . Coronary  artery disease Father   . Heart disease Father        After age 32  . Heart attack Father   . Heart disease Sister        After age 80  . Heart disease Brother        After age 34  . Coronary artery disease Unknown        13 sibling, almost all have coronary disease and some with premature onset  . Heart disease Unknown        13 sibling, almost all have coronary disease and some with premature onset    ROS: no fevers or chills, productive cough, hemoptysis, dysphasia, odynophagia, melena, hematochezia, dysuria, hematuria, rash, seizure activity, orthopnea, PND, claudication. Remaining systems are negative.  Physical Exam: Well-developed well-nourished in no acute distress.  Skin is warm and dry.  HEENT is normal.  Neck is supple.  Chest is clear to auscultation with normal expansion.  Cardiovascular exam is irregular, 3/6 systolic murmur; S2 is diminished. Abdominal exam nontender or distended. No masses palpated. Extremities show 1+ edema. neuro grossly intact  ECG-atrial fibrillation with PVCs or aberrantly conducted beats.  Left ventricular hypertrophy.  Cannot rule out anterior and inferior infarct.  Personally reviewed  A/P  1 aortic stenosis-patient has severe aortic stenosis and moderate mitral stenosis on last echocardiogram.  He has now developed increasing dyspnea on exertion over the past 6 months.  We will plan to repeat his echocardiogram.  He will likely need to proceed with TAVR evaluation following his echocardiogram.  Patient in agreement.  Note this would not address his  mitral valve.  2 permanent atrial fibrillation-continue beta-blocker for rate control. Continue pradaxa.   3 carotid artery disease-follow-up carotid Dopplers October 2019.  4 coronary artery disease-continue statin.  No aspirin given need for anticoagulation.  5 hypertension-blood pressure is controlled.  Continue present medications.  6 hyperlipidemia-continue statin.  7 renal artery stenosis-continue statin.  Kirk Ruths, MD

## 2017-07-23 ENCOUNTER — Encounter: Payer: Self-pay | Admitting: Cardiology

## 2017-07-23 ENCOUNTER — Ambulatory Visit (INDEPENDENT_AMBULATORY_CARE_PROVIDER_SITE_OTHER): Payer: Medicare Other | Admitting: Cardiology

## 2017-07-23 VITALS — BP 130/60 | HR 70 | Ht 72.0 in | Wt 221.2 lb

## 2017-07-23 DIAGNOSIS — I350 Nonrheumatic aortic (valve) stenosis: Secondary | ICD-10-CM

## 2017-07-23 DIAGNOSIS — I4821 Permanent atrial fibrillation: Secondary | ICD-10-CM

## 2017-07-23 DIAGNOSIS — I482 Chronic atrial fibrillation: Secondary | ICD-10-CM | POA: Diagnosis not present

## 2017-07-23 DIAGNOSIS — I2510 Atherosclerotic heart disease of native coronary artery without angina pectoris: Secondary | ICD-10-CM

## 2017-07-23 NOTE — Patient Instructions (Signed)
Medication Instructions:   NO CHANGE  Testing/Procedures:  Your physician has requested that you have an echocardiogram. Echocardiography is a painless test that uses sound waves to create images of your heart. It provides your doctor with information about the size and shape of your heart and how well your heart's chambers and valves are working. This procedure takes approximately one hour. There are no restrictions for this procedure.    Follow-Up:  Your physician recommends that you schedule a follow-up appointment in: [place]   If you need a refill on your cardiac medications before your next appointment, please call your pharmacy.

## 2017-07-29 ENCOUNTER — Other Ambulatory Visit: Payer: Self-pay

## 2017-07-29 ENCOUNTER — Ambulatory Visit (HOSPITAL_COMMUNITY): Payer: Medicare Other | Attending: Cardiovascular Disease

## 2017-07-29 DIAGNOSIS — I350 Nonrheumatic aortic (valve) stenosis: Secondary | ICD-10-CM | POA: Diagnosis not present

## 2017-07-29 DIAGNOSIS — I083 Combined rheumatic disorders of mitral, aortic and tricuspid valves: Secondary | ICD-10-CM | POA: Insufficient documentation

## 2017-07-29 DIAGNOSIS — I739 Peripheral vascular disease, unspecified: Secondary | ICD-10-CM | POA: Insufficient documentation

## 2017-07-29 DIAGNOSIS — J449 Chronic obstructive pulmonary disease, unspecified: Secondary | ICD-10-CM | POA: Insufficient documentation

## 2017-07-29 DIAGNOSIS — I252 Old myocardial infarction: Secondary | ICD-10-CM | POA: Insufficient documentation

## 2017-07-29 DIAGNOSIS — I509 Heart failure, unspecified: Secondary | ICD-10-CM | POA: Diagnosis not present

## 2017-07-29 DIAGNOSIS — I4891 Unspecified atrial fibrillation: Secondary | ICD-10-CM | POA: Diagnosis not present

## 2017-08-08 ENCOUNTER — Ambulatory Visit (INDEPENDENT_AMBULATORY_CARE_PROVIDER_SITE_OTHER): Payer: Medicare Other | Admitting: Cardiovascular Disease

## 2017-08-08 ENCOUNTER — Telehealth: Payer: Self-pay

## 2017-08-08 ENCOUNTER — Encounter: Payer: Self-pay | Admitting: Cardiovascular Disease

## 2017-08-08 VITALS — BP 116/48 | HR 68 | Ht 72.0 in | Wt 220.1 lb

## 2017-08-08 DIAGNOSIS — I350 Nonrheumatic aortic (valve) stenosis: Secondary | ICD-10-CM | POA: Diagnosis not present

## 2017-08-08 DIAGNOSIS — I2510 Atherosclerotic heart disease of native coronary artery without angina pectoris: Secondary | ICD-10-CM | POA: Diagnosis not present

## 2017-08-08 LAB — BASIC METABOLIC PANEL
BUN/Creatinine Ratio: 12 (ref 10–24)
BUN: 11 mg/dL (ref 8–27)
CO2: 28 mmol/L (ref 20–29)
CREATININE: 0.94 mg/dL (ref 0.76–1.27)
Calcium: 9.6 mg/dL (ref 8.6–10.2)
Chloride: 101 mmol/L (ref 96–106)
GFR calc Af Amer: 86 mL/min/{1.73_m2} (ref >59)
GFR, EST NON AFRICAN AMERICAN: 75 mL/min/{1.73_m2} (ref >59)
GLUCOSE: 139 mg/dL — AB (ref 65–99)
Potassium: 3.8 mmol/L (ref 3.5–5.2)
SODIUM: 143 mmol/L (ref 134–144)

## 2017-08-08 LAB — CBC WITH DIFFERENTIAL/PLATELET
BASOS: 0 %
Basophils Absolute: 0 10*3/uL (ref 0.0–0.2)
EOS (ABSOLUTE): 0.4 10*3/uL (ref 0.0–0.4)
EOS: 4 %
HEMATOCRIT: 37.3 % — AB (ref 37.5–51.0)
HEMOGLOBIN: 12.6 g/dL — AB (ref 13.0–17.7)
IMMATURE GRANS (ABS): 0 10*3/uL (ref 0.0–0.1)
IMMATURE GRANULOCYTES: 0 %
LYMPHS: 20 %
Lymphocytes Absolute: 1.9 10*3/uL (ref 0.7–3.1)
MCH: 29.4 pg (ref 26.6–33.0)
MCHC: 33.8 g/dL (ref 31.5–35.7)
MCV: 87 fL (ref 79–97)
MONOCYTES: 9 %
MONOS ABS: 0.9 10*3/uL (ref 0.1–0.9)
NEUTROS PCT: 67 %
Neutrophils Absolute: 6.4 10*3/uL (ref 1.4–7.0)
Platelets: 222 10*3/uL (ref 150–379)
RBC: 4.28 x10E6/uL (ref 4.14–5.80)
RDW: 15.1 % (ref 12.3–15.4)
WBC: 9.6 10*3/uL (ref 3.4–10.8)

## 2017-08-08 NOTE — H&P (View-Only) (Signed)
Cardiology Office Note Date:  08/08/2017   ID:  [name], DOB 04-Oct-1931, MRN [id number]  PCP:  Nicoletta Dress, MD  Cardiologist:  Dr Stanford Breed  Chief Complaint  Patient presents with  . TAVR consult    Ref by Dr.Crenshaw   History of Present Illness: [name] is a 84 y.o. male who presents for evaluation of aortic stenosis.  The patient has been followed for coronary artery disease, carotid disease, and permanent atrial fibrillation.  He underwent multivessel CABG in 1994 with a LIMA to LAD graft and sequential saphenous vein graft to OM 2 and OM 3 vessels.  His last heart catheterization in 2015 demonstrated continued graft patency with severe stenosis of the distal LAD beyond the LIMA insertion site.  He was treated with PTCA through the LIMA graft without complication.  He has been chronically anticoagulated with Pradaxa without significant bleeding problems.  The patient is here with his wife today. He is on home oxygen for the past 6-7 years with intermittent daytime use and regular nocturnal use. He quit smoking in 1994 at the time of his CABG but is followed for COPD. He has shortness of breath with walking, also limited by right hip pain. He ambulates with a cane. He has no dyspnea at rest. He denies chest pain, chest pressure, or lightheadedness with exertion.   He's had a heart murmur for many years. Over recent years has been followed for moderate AS now progressing into the severe range. Echo 2016 AV mean gradient 30 mmHg ---> 31 mmHg in 2017 ---> 46 mmHg 2018 ---> 51 mmHg 07/29/2017  Past Medical History:  Diagnosis Date  . Acute myocardial infarction, unspecified site, initial episode of care   . Anemia   . Anxiety   . Aortic stenosis    . Atrial fibrillation ([place])    a. permanent, on Coumadin for anticoagulation  . CAD (coronary artery disease)    a. s/p CABG in 1994 b. cath in 2015 showing normal LM, 100% LCx and RCA stenosis with 50-60% prox LAD  stenosis and 100% mid-LAD stenosis; patent sequential SVG-OM2-OM3 and patent LIMA-LAD with PTCA of distal LAD via LIMA graft performed at that time  . Carotid stenosis   . CHF (congestive heart failure) ([place])   . COPD (chronic obstructive pulmonary disease) ([place])   . Depression   . Diabetes mellitus without complication ([place])    FASTING 98-120S  . GERD (gastroesophageal reflux disease)   . History of blood transfusion   . History of peptic ulcer disease   . Hyperlipidemia   . Hypertension   . Myocardial infarction ([place])    X2  . Peripheral vascular disease ([place])   . Pneumonia    HX OF  . Renal artery stenosis ([place])   . Sleep apnea    OXYGEN AT NIGHT 2L [place]    Past Surgical History:  Procedure Laterality Date  . CAROTID ENDARTERECTOMY    . CORONARY ARTERY BYPASS GRAFT  1994  . ENDARTERECTOMY Right 01/05/2013   Procedure: ENDARTERECTOMY CAROTID;  Surgeon: Rosetta Posner, MD;  Location: [facility];  Service: Vascular;  Laterality: Right;  . KNEE SURGERY  08/2003   left knee/ARTHROSCOPIC  . LEFT HEART CATHETERIZATION WITH CORONARY/GRAFT ANGIOGRAM N/A 09/01/2013   Procedure: LEFT HEART CATHETERIZATION WITH Beatrix Fetters;  Surgeon: Sinclair Grooms, MD;  Location[facility] CATH LAB;  Service: Cardiovascular;  Laterality: N/A;  . PATCH ANGIOPLASTY Right 01/05/2013   Procedure: PATCH ANGIOPLASTY;  Surgeon:  Rosetta Posner, MD;  Location: [facility];  Service: Vascular;  Laterality: Right;  . PERCUTANEOUS CORONARY STENT INTERVENTION (PCI-S)  09/01/2013   Procedure: PERCUTANEOUS CORONARY STENT INTERVENTION (PCI-S);  Surgeon: Sinclair Grooms, MD;  Location: [facility] CATH LAB;  Service: Cardiovascular;;  . removal of bleeding ulcer  1994  . RENAL ARTERY STENT     stenting of the left renal artery as well as a cutting balloon angioplasty for treatment of in-stent restenosis coronary artery bypass grafting in 1994.    Current Outpatient Medications  Medication Sig Dispense Refill  . acetaminophen (TYLENOL)  500 MG tablet Take 500 mg by mouth every 6 (six) hours as needed for pain.     [name] acyclovir (ZOVIRAX) 800 MG tablet Take 400 mg by mouth 2 (two) times daily.     [name] albuterol (PROVENTIL,VENTOLIN) 90 MCG/ACT inhaler Inhale 2 puffs into the lungs every 4 (four) hours as needed for shortness of breath.     [name] atorvastatin (LIPITOR) 80 MG tablet Take 1 tablet (80 mg total) by mouth at bedtime. 90 tablet 3  . brimonidine (ALPHAGAN P) 0.1 % SOLN Place 1 drop into both eyes 2 (two) times daily.    . Cholecalciferol (VITAMIN D) 2000 UNITS CAPS Take 2,000 Units by mouth daily.    . dabigatran (PRADAXA) 150 MG CAPS Take 150 mg by mouth 2 (two) times daily.     . ferrous sulfate 325 (65 FE) MG tablet Take 325 mg by mouth daily with breakfast.     . furosemide (LASIX) 20 MG tablet Take 20 mg by mouth daily.      [name] ipratropium-albuterol (DUONEB) 0.5-2.5 (3) MG/3ML SOLN Take 3 mLs by nebulization as directed.    . lansoprazole (PREVACID) 30 MG capsule Take 30 mg by mouth daily.      . metoprolol (LOPRESSOR) 50 MG tablet Take 1 tablet (50 mg total) by mouth 2 (two) times daily. 180 tablet 4  . Multiple Vitamin (MULTIVITAMIN WITH MINERALS) TABS Take 1 tablet by mouth daily.    . multivitamin-lutein (OCUVITE-LUTEIN) CAPS Take 1 capsule by mouth daily.    . predniSONE (DELTASONE) 20 MG tablet Take 20 mg by mouth as directed.     . terazosin (HYTRIN) 5 MG capsule Take 5 mg by mouth at bedtime.     . trolamine salicylate (ASPERCREME) 10 % cream Apply 1 application topically 4 (four) times daily as needed (pain).      No current facility-administered medications for this visit.     Allergies:   Bactrim [sulfamethoxazole-trimethoprim] and Levaquin [levofloxacin in d5w]   Social History:  The patient  reports that he quit smoking about 24 years ago. he has never used smokeless tobacco. He reports that he does not drink alcohol or use drugs.   Family History:  The patient's family history includes Coronary artery  disease in his father, mother, and unknown relative; Heart attack in his father and mother; Heart disease in his brother, father, mother, sister, and unknown relative; Hyperlipidemia in his mother; Hypertension in his mother.   ROS:  Please see the history of present illness.  Otherwise, review of systems is positive for fatigue, back pain, easy bruising.  All other systems are reviewed and negative.   PHYSICAL EXAM: VS:  BP (!) 116/48   Pulse 68   Ht 6' (1.829 m)   Wt 220 lb 1.9 oz (99.8 kg)   SpO2 97%   BMI 29.85 kg/m  , BMI Body mass index is  29.85 kg/m. GEN: Well nourished, well developed, in no acute distress  HEENT: normal  Neck: no JVD, no masses. bilateral carotid bruits Cardiac: irregular with a grade 3/6 harsh late peaking systolic murmur loudest at the LLSB/apex with absent A2           Respiratory:  clear to auscultation bilaterally, normal work of breathing GI: soft, nontender, nondistended, + BS MS: no deformity or atrophy  Ext: trace bilateral pretibial edema, pedal pulses 2+= bilaterally Skin: warm and dry, no rash Neuro:  Strength and sensation are intact Psych: euthymic mood, full affect  EKG:  EKG is not ordered today.  Recent Labs: No results found for requested labs within last 8760 hours.   Lipid Panel     Component Value Date/Time   CHOL 142 11/17/2012 0909   TRIG 90.0 11/17/2012 0909   HDL 46.80 11/17/2012 0909   CHOLHDL 3 11/17/2012 0909   VLDL 18.0 11/17/2012 0909   LDLCALC 77 11/17/2012 0909   LDLDIRECT 67.6 09/08/2009 0000      Wt Readings from Last 3 Encounters:  08/08/17 220 lb 1.9 oz (99.8 kg)  07/23/17 221 lb 3.2 oz (100.3 kg)  02/14/17 214 lb (97.1 kg)    Cardiac Studies Reviewed: 2D Echo 07-29-2017: Study Conclusions  - Left ventricle: The cavity size was severely dilated. Wall   thickness was increased in a pattern of moderate LVH. Systolic   function was normal. The estimated ejection fraction was in the   range of 55% to  60%. - Aortic valve: Moderately calcified annulus. Severely thickened,   severely calcified leaflets. There was severe stenosis. There was   mild regurgitation. Valve area (VTI): 0.56 cm^2. Valve area   (Vmax): 0.63 cm^2. Valve area (Vmean): 0.57 cm^2. - Mitral valve: Mildly to moderately calcified annulus. Mildly   thickened, severely calcified leaflets . The findings are   consistent with moderate stenosis. Valve area by pressure   half-time: 1.69 cm^2. Valve area by continuity equation (using   LVOT flow): 1 cm^2. - Left atrium: The atrium was moderately dilated. - Right ventricle: Systolic function was mildly reduced. - Right atrium: The atrium was mildly dilated. - Tricuspid valve: There was mild-moderate regurgitation.  ------------------------------------------------------------------- Labs, prior tests, procedures, and surgery: Coronary artery bypass grafting.  ------------------------------------------------------------------- Study data:  Comparison was made to the study of 09/11/2016.  Study status:  Routine.  Procedure:  The patient reported no pain pre or post test. Transthoracic echocardiography. Image quality was adequate.          Transthoracic echocardiography.  M-mode, complete 2D, spectral Doppler, and color Doppler.  Birthdate: Patient birthdate: 1932-05-31.  Age:  Patient is 84 yr old.  Sex: Gender: male.    BMI: 30 kg/m^2.  Blood pressure:     130/60 Patient status:  Outpatient.  Study date:  Study date: 07/29/2017. Study time: 02:10 PM.  Location:  [facility]  -------------------------------------------------------------------  ------------------------------------------------------------------- Left ventricle:  The cavity size was severely dilated. Wall thickness was increased in a pattern of moderate LVH. Systolic function was normal. The estimated ejection fraction was in the range of 55% to 60%. The study was not technically sufficient  to allow evaluation of LV diastolic dysfunction due to atrial fibrillation.  ------------------------------------------------------------------- Aortic valve:   Moderately calcified annulus. Severely thickened, severely calcified leaflets.  Doppler:   There was severe stenosis.   There was mild regurgitation.    VTI ratio of LVOT to aortic valve: 0.18. Valve area (VTI): 0.56 cm^2.  Indexed valve area (VTI): 0.25 cm^2/m^2. Peak velocity ratio of LVOT to aortic valve: 0.2. Valve area (Vmax): 0.63 cm^2. Indexed valve area (Vmax): 0.27 cm^2/m^2. Mean velocity ratio of LVOT to aortic valve: 0.18. Valve area (Vmean): 0.57 cm^2. Indexed valve area (Vmean): 0.25 cm^2/m^2.    Mean gradient (S): 51 mm Hg. Peak gradient (S): 85 mm Hg.  ------------------------------------------------------------------- Aorta:  Aortic root: The aortic root was normal in size. Ascending aorta: The ascending aorta was normal in size.  ------------------------------------------------------------------- Mitral valve:   Mildly to moderately calcified annulus. Mildly thickened, severely calcified leaflets .  Doppler:   The findings are consistent with moderate stenosis.   There was trivial regurgitation.    Valve area by pressure half-time: 1.69 cm^2. Indexed valve area by pressure half-time: 0.74 cm^2/m^2. Valve area by continuity equation (using LVOT flow): 1 cm^2. Indexed valve area by continuity equation (using LVOT flow): 0.44 cm^2/m^2. Mean gradient (D): 8 mm Hg. Peak gradient (D): 21 mm Hg.  ------------------------------------------------------------------- Left atrium:  The atrium was moderately dilated.  ------------------------------------------------------------------- Right ventricle:  The cavity size was normal. Systolic function was mildly reduced.  ------------------------------------------------------------------- Pulmonic valve:    The valve appears to be grossly normal. Doppler:  There was  no significant regurgitation.  ------------------------------------------------------------------- Tricuspid valve:   The valve appears to be grossly normal. Doppler:  There was mild-moderate regurgitation.  ------------------------------------------------------------------- Pulmonary artery:   Systolic pressure was within the normal range.   ------------------------------------------------------------------- Right atrium:  The atrium was mildly dilated.  ------------------------------------------------------------------- Pericardium:  There was no pericardial effusion.  ------------------------------------------------------------------- Systemic veins: Inferior vena cava: The vessel was dilated. The respirophasic diameter changes were blunted (< 50%), consistent with elevated central venous pressure. Diameter: 25 mm.  ------------------------------------------------------------------- Measurements   IVC                                      Value          Reference  ID                                       25    mm       ----------    Left ventricle                           Value          Reference  LV ID, ED, PLAX chordal          (H)     52.6  mm       43 - 52  LV ID, ES, PLAX chordal                  35.7  mm       23 - 38  LV fx shortening, PLAX chordal           32    %        >=29  LV PW thickness, ED                      15.1  mm       ----------  IVS/LV PW ratio, ED  0.79           <=1.3  Stroke volume, 2D                        67    ml       ----------  Stroke volume/bsa, 2D                    29    ml/m^2   ----------  LV e&', lateral                           9.25  cm/s     ----------  LV E/e&', lateral                         24.76          ----------  LV e&', medial                            7.61  cm/s     ----------  LV E/e&', medial                          30.09          ----------  LV e&', average                           8.43   cm/s     ----------  LV E/e&', average                         27.16          ----------    Ventricular septum                       Value          Reference  IVS thickness, ED                        12    mm       ----------    LVOT                                     Value          Reference  LVOT ID, S                               20    mm       ----------  LVOT area                                3.14  cm^2     ----------  LVOT peak velocity, S                    91.8  cm/s     ----------  LVOT mean velocity, S                    60.4  cm/s     ----------  LVOT VTI, S  21.2  cm       ----------    Aortic valve                             Value          Reference  Aortic valve peak velocity, S            461   cm/s     ----------  Aortic valve mean velocity, S            334   cm/s     ----------  Aortic valve VTI, S                      118   cm       ----------  Aortic mean gradient, S                  51    mm Hg    ----------  Aortic peak gradient, S                  85    mm Hg    ----------  VTI ratio, LVOT/AV                       0.18           ----------  Aortic valve area, VTI                   0.56  cm^2     ----------  Aortic valve area/bsa, VTI               0.25  cm^2/m^2 ----------  Velocity ratio, peak, LVOT/AV            0.2            ----------  Aortic valve area, peak velocity         0.63  cm^2     ----------  Aortic valve area/bsa, peak              0.27  cm^2/m^2 ----------  velocity  Velocity ratio, mean, LVOT/AV            0.18           ----------  Aortic valve area, mean velocity         0.57  cm^2     ----------  Aortic valve area/bsa, mean              0.25  cm^2/m^2 ----------  velocity  Aortic regurg pressure half-time         365   ms       ----------    Aorta                                    Value          Reference  Aortic root ID, ED                       37    mm       ----------    Left atrium                               Value  Reference  LA ID, A-P, ES                           49    mm       ----------  LA ID/bsa, A-P                           2.15  cm/m^2   <=2.2  LA volume, S                             98.5  ml       ----------  LA volume/bsa, S                         43.2  ml/m^2   ----------  LA volume, ES, 1-p A4C                   83.2  ml       ----------  LA volume/bsa, ES, 1-p A4C               36.5  ml/m^2   ----------  LA volume, ES, 1-p A2C                   98.8  ml       ----------  LA volume/bsa, ES, 1-p A2C               43.3  ml/m^2   ----------    Mitral valve                             Value          Reference  Mitral E-wave peak velocity              229   cm/s     ----------  Mitral mean velocity, D                  113   cm/s     ----------  Mitral deceleration time                 211   ms       150 - 230  Mitral pressure half-time                120   ms       ----------  Mitral mean gradient, D                  8     mm Hg    ----------  Mitral peak gradient, D                  21    mm Hg    ----------  Mitral valve area, PHT, DP               1.69  cm^2     ----------  Mitral valve area/bsa, PHT, DP           0.74  cm^2/m^2 ----------  Mitral valve area, LVOT                  1     cm^2     ----------  continuity  Mitral valve area/bsa, LVOT  0.44  cm^2/m^2 ----------  continuity  Mitral annulus VTI, D                    66.4  cm       ----------    Pulmonary arteries                       Value          Reference  PA pressure, S, DP                       20    mm Hg    <=30    Tricuspid valve                          Value          Reference  Tricuspid regurg peak velocity           176   cm/s     ----------  Tricuspid peak RV-RA gradient            12    mm Hg    ----------  Tricuspid maximal regurg                 176   cm/s     ----------  velocity, PISA    Right atrium                             Value          Reference  RA ID,  S-I, ES, A4C              (H)     59.4  mm       34 - 49  RA area, ES, A4C                 (H)     20.8  cm^2     8.3 - 19.5  RA volume, ES, A/L                       59.3  ml       ----------  RA volume/bsa, ES, A/L                   26    ml/m^2   ----------    Systemic veins                           Value          Reference  Estimated CVP                            8     mm Hg    ----------    Right ventricle                          Value          Reference  TAPSE                                    15.1  mm       ----------  RV pressure, S, DP  20    mm Hg    <=30  RV s&', lateral, S                        7.51  cm/s     ----------  Carotid Duplex 03-28-2017: Final Interpretation: Right Carotid: There is evidence in the right ICA of a 1-39% stenosis.        Non-hemodynamically significant plaque <50% noted in the CCA. The        RICA is patent with velocities remaining within normal range and        stable compared to the prior exam, s/p endartectomy.  Left Carotid: There is evidence in the left ICA of a 40-59% stenosis.       Non-hemodynamically significant plaque noted in the CCA. The LICA       velocities are elevated and stable compared to the prior exam.  Vertebrals: Both vertebral arteries were patent with antegrade flow. Subclavians: Normal flow hemodynamics were seen in bilateral subclavian       arteries.  STS Risk Calcuator: Risk of Mortality: 10.490% Renal Failure: 5.931% Permanent Stroke: 2.243% Prolonged Ventilation: 20.735% DSW Infection: 0.298% Reoperation: 3.512% Morbidity or Mortality: 30.827% Short Length of Stay: 11.995% Long Length of Stay: 18.190%  ASSESSMENT AND PLAN: 84 year old male with severe, stage D1 aortic stenosis with New York Heart Association functional class II symptoms of chronic diastolic heart failure.  I have personally reviewed the patient's echo study  which confirms preserved LV systolic function, progressive and severe aortic stenosis with a mean transvalvular gradient of 51 mmHg and severe calcification and restriction of all 3 aortic valve leaflets.  He does have calcification and thickening of the mitral leaflets with doppler findings consistent with moderate mitral stenosis. However, I do not think this is prohibitive for consideration of TAVR.  In addition, PA pressures are normal by echo assessment which argues against severe hemodynamically significant mitral stenosis. CTA of the heart will better characterize the degree of calcification in the intervalvular fibrosa and anatomic suitability for TAVR.  His most recent heart catheterization study is reviewed which demonstrates continued patency of both his mammary artery graft to the LAD and sequential saphenous vein graft to the circumflex distribution.  I have reviewed the natural history of aortic stenosis with the patient and his wife who is present today. We have discussed the limitations of medical therapy and the poor prognosis associated with symptomatic aortic stenosis. We have reviewed potential treatment options, including palliative medical therapy, conventional surgical aortic valve replacement, and transcatheter aortic valve replacement. We discussed treatment options in the context of the patient's specific comorbid medical conditions.   Considering the patient's comorbid medical conditions which include advanced age greater than 60 years old, previous cardiac surgery, permanent atrial fibrillation, cerebrovascular disease with history of carotid endarterectomy, and significant functional limitation from osteoarthritis with limited mobility, TAVR seems like a preferred treatment option for this patient.  I have reviewed the necessary evaluation which will include a right and left heart catheterization, CT angiogram studies of the heart and chest/abdomen/pelvis, and cardiac surgical  evaluation.  The patient's original heart surgery in 1994 was performed by Dr. Servando Snare.  They understand that the studies will help determine anatomic feasibility of TAVR as well as preferred access.  After his studies are completed he will continue with a multitude disciplinary approach to determine his best treatment option.  I have reviewed the risks, indications, and alternatives to cardiac catheterization, possible angioplasty,  and stenting with the patient. Risks include but are not limited to bleeding, infection, vascular injury, stroke, myocardial infection, arrhythmia, kidney injury, radiation-related injury in the case of prolonged fluoroscopy use, emergency cardiac surgery, and death. The patient understands the risks of serious complication is 1-2 in 6770 with diagnostic cardiac cath and 1-2% or less with angioplasty/stenting.   I did review the TAVR procedure with the patient and his wife today.  We reviewed potential complications, expected recovery, and expected benefits. He understands and would like to proceed with evaluation as outlined above.   Current medicines are reviewed with the patient today.  The patient does not have concerns regarding medicines.  Labs/ tests ordered today include:  No orders of the defined types were placed in this encounter.  Signed, Sherren Mocha, MD  08/08/2017 10:04 AM    [address], [place], [place]  [address] Phone: [phone]; Fax: [phone]

## 2017-08-08 NOTE — Progress Notes (Signed)
Cardiology Office Note Date:  08/08/2017   ID:  [name], DOB 07/16/1931, MRN [id number]  PCP:  Nicoletta Dress, MD  Cardiologist:  Dr Stanford Breed  Chief Complaint  Patient presents with  . TAVR consult    Ref by Dr.Crenshaw   History of Present Illness: [name] is a 84 y.o. male who presents for evaluation of aortic stenosis.  The patient has been followed for coronary artery disease, carotid disease, and permanent atrial fibrillation.  He underwent multivessel CABG in 1994 with a LIMA to LAD graft and sequential saphenous vein graft to OM 2 and OM 3 vessels.  His last heart catheterization in 2015 demonstrated continued graft patency with severe stenosis of the distal LAD beyond the LIMA insertion site.  He was treated with PTCA through the LIMA graft without complication.  He has been chronically anticoagulated with Pradaxa without significant bleeding problems.  The patient is here with his wife today. He is on home oxygen for the past 6-7 years with intermittent daytime use and regular nocturnal use. He quit smoking in 1994 at the time of his CABG but is followed for COPD. He has shortness of breath with walking, also limited by right hip pain. He ambulates with a cane. He has no dyspnea at rest. He denies chest pain, chest pressure, or lightheadedness with exertion.   He's had a heart murmur for many years. Over recent years has been followed for moderate AS now progressing into the severe range. Echo 2016 AV mean gradient 30 mmHg ---> 31 mmHg in 2017 ---> 46 mmHg 2018 ---> 51 mmHg 07/29/2017  Past Medical History:  Diagnosis Date  . Acute myocardial infarction, unspecified site, initial episode of care   . Anemia   . Anxiety   . Aortic stenosis    . Atrial fibrillation ([place])    a. permanent, on Coumadin for anticoagulation  . CAD (coronary artery disease)    a. s/p CABG in 1994 b. cath in 2015 showing normal LM, 100% LCx and RCA stenosis with 50-60% prox LAD  stenosis and 100% mid-LAD stenosis; patent sequential SVG-OM2-OM3 and patent LIMA-LAD with PTCA of distal LAD via LIMA graft performed at that time  . Carotid stenosis   . CHF (congestive heart failure) ([place])   . COPD (chronic obstructive pulmonary disease) ([place])   . Depression   . Diabetes mellitus without complication ([place])    FASTING 98-120S  . GERD (gastroesophageal reflux disease)   . History of blood transfusion   . History of peptic ulcer disease   . Hyperlipidemia   . Hypertension   . Myocardial infarction ([place])    X2  . Peripheral vascular disease ([place])   . Pneumonia    HX OF  . Renal artery stenosis ([place])   . Sleep apnea    OXYGEN AT NIGHT 2L     Past Surgical History:  Procedure Laterality Date  . CAROTID ENDARTERECTOMY    . CORONARY ARTERY BYPASS GRAFT  1994  . ENDARTERECTOMY Right 01/05/2013   Procedure: ENDARTERECTOMY CAROTID;  Surgeon: Rosetta Posner, MD;  Location: [facility];  Service: Vascular;  Laterality: Right;  . KNEE SURGERY  08/2003   left knee/ARTHROSCOPIC  . LEFT HEART CATHETERIZATION WITH CORONARY/GRAFT ANGIOGRAM N/A 09/01/2013   Procedure: LEFT HEART CATHETERIZATION WITH Beatrix Fetters;  Surgeon: Sinclair Grooms, MD;  Location: [facility] CATH LAB;  Service: Cardiovascular;  Laterality: N/A;  . PATCH ANGIOPLASTY Right 01/05/2013   Procedure: PATCH ANGIOPLASTY;  Surgeon:  Rosetta Posner, MD;  Location: [facility];  Service: Vascular;  Laterality: Right;  . PERCUTANEOUS CORONARY STENT INTERVENTION (PCI-S)  09/01/2013   Procedure: PERCUTANEOUS CORONARY STENT INTERVENTION (PCI-S);  Surgeon: Sinclair Grooms, MD;  Location: [facility] CATH LAB;  Service: Cardiovascular;;  . removal of bleeding ulcer  1994  . RENAL ARTERY STENT     stenting of the left renal artery as well as a cutting balloon angioplasty for treatment of in-stent restenosis coronary artery bypass grafting in 1994.    Current Outpatient Medications  Medication Sig Dispense Refill  . acetaminophen (TYLENOL)  500 MG tablet Take 500 mg by mouth every 6 (six) hours as needed for pain.     [name] acyclovir (ZOVIRAX) 800 MG tablet Take 400 mg by mouth 2 (two) times daily.     [name] albuterol (PROVENTIL,VENTOLIN) 90 MCG/ACT inhaler Inhale 2 puffs into the lungs every 4 (four) hours as needed for shortness of breath.     [name] atorvastatin (LIPITOR) 80 MG tablet Take 1 tablet (80 mg total) by mouth at bedtime. 90 tablet 3  . brimonidine (ALPHAGAN P) 0.1 % SOLN Place 1 drop into both eyes 2 (two) times daily.    . Cholecalciferol (VITAMIN D) 2000 UNITS CAPS Take 2,000 Units by mouth daily.    . dabigatran (PRADAXA) 150 MG CAPS Take 150 mg by mouth 2 (two) times daily.     . ferrous sulfate 325 (65 FE) MG tablet Take 325 mg by mouth daily with breakfast.     . furosemide (LASIX) 20 MG tablet Take 20 mg by mouth daily.      [name] ipratropium-albuterol (DUONEB) 0.5-2.5 (3) MG/3ML SOLN Take 3 mLs by nebulization as directed.    . lansoprazole (PREVACID) 30 MG capsule Take 30 mg by mouth daily.      . metoprolol (LOPRESSOR) 50 MG tablet Take 1 tablet (50 mg total) by mouth 2 (two) times daily. 180 tablet 4  . Multiple Vitamin (MULTIVITAMIN WITH MINERALS) TABS Take 1 tablet by mouth daily.    . multivitamin-lutein (OCUVITE-LUTEIN) CAPS Take 1 capsule by mouth daily.    . predniSONE (DELTASONE) 20 MG tablet Take 20 mg by mouth as directed.     . terazosin (HYTRIN) 5 MG capsule Take 5 mg by mouth at bedtime.     . trolamine salicylate (ASPERCREME) 10 % cream Apply 1 application topically 4 (four) times daily as needed (pain).      No current facility-administered medications for this visit.     Allergies:   Bactrim [sulfamethoxazole-trimethoprim] and Levaquin [levofloxacin in d5w]   Social History:  The patient  reports that he quit smoking about 24 years ago. he has never used smokeless tobacco. He reports that he does not drink alcohol or use drugs.   Family History:  The patient's family history includes Coronary artery  disease in his father, mother, and unknown relative; Heart attack in his father and mother; Heart disease in his brother, father, mother, sister, and unknown relative; Hyperlipidemia in his mother; Hypertension in his mother.   ROS:  Please see the history of present illness.  Otherwise, review of systems is positive for fatigue, back pain, easy bruising.  All other systems are reviewed and negative.   PHYSICAL EXAM: VS:  BP (!) 116/48   Pulse 68   Ht 6' (1.829 m)   Wt 220 lb 1.9 oz (99.8 kg)   SpO2 97%   BMI 29.85 kg/m  , BMI Body mass index is  29.85 kg/m. GEN: Well nourished, well developed, in no acute distress  HEENT: normal  Neck: no JVD, no masses. bilateral carotid bruits Cardiac: irregular with a grade 3/6 harsh late peaking systolic murmur loudest at the LLSB/apex with absent A2           Respiratory:  clear to auscultation bilaterally, normal work of breathing GI: soft, nontender, nondistended, + BS MS: no deformity or atrophy  Ext: trace bilateral pretibial edema, pedal pulses 2+= bilaterally Skin: warm and dry, no rash Neuro:  Strength and sensation are intact Psych: euthymic mood, full affect  EKG:  EKG is not ordered today.  Recent Labs: No results found for requested labs within last 8760 hours.   Lipid Panel     Component Value Date/Time   CHOL 142 11/17/2012 0909   TRIG 90.0 11/17/2012 0909   HDL 46.80 11/17/2012 0909   CHOLHDL 3 11/17/2012 0909   VLDL 18.0 11/17/2012 0909   LDLCALC 77 11/17/2012 0909   LDLDIRECT 67.6 09/08/2009 0000      Wt Readings from Last 3 Encounters:  08/08/17 220 lb 1.9 oz (99.8 kg)  07/23/17 221 lb 3.2 oz (100.3 kg)  02/14/17 214 lb (97.1 kg)    Cardiac Studies Reviewed: 2D Echo 07-29-2017: Study Conclusions  - Left ventricle: The cavity size was severely dilated. Wall   thickness was increased in a pattern of moderate LVH. Systolic   function was normal. The estimated ejection fraction was in the   range of 55% to  60%. - Aortic valve: Moderately calcified annulus. Severely thickened,   severely calcified leaflets. There was severe stenosis. There was   mild regurgitation. Valve area (VTI): 0.56 cm^2. Valve area   (Vmax): 0.63 cm^2. Valve area (Vmean): 0.57 cm^2. - Mitral valve: Mildly to moderately calcified annulus. Mildly   thickened, severely calcified leaflets . The findings are   consistent with moderate stenosis. Valve area by pressure   half-time: 1.69 cm^2. Valve area by continuity equation (using   LVOT flow): 1 cm^2. - Left atrium: The atrium was moderately dilated. - Right ventricle: Systolic function was mildly reduced. - Right atrium: The atrium was mildly dilated. - Tricuspid valve: There was mild-moderate regurgitation.  ------------------------------------------------------------------- Labs, prior tests, procedures, and surgery: Coronary artery bypass grafting.  ------------------------------------------------------------------- Study data:  Comparison was made to the study of 09/11/2016.  Study status:  Routine.  Procedure:  The patient reported no pain pre or post test. Transthoracic echocardiography. Image quality was adequate.          Transthoracic echocardiography.  M-mode, complete 2D, spectral Doppler, and color Doppler.  Birthdate: Patient birthdate: 04/23/1932.  Age:  Patient is 84 yr old.  Sex: Gender: male.    BMI: 30 kg/m^2.  Blood pressure:     130/60 Patient status:  Outpatient.  Study date:  Study date: 07/29/2017. Study time: 02:10 PM.  Location:  [facility]  -------------------------------------------------------------------  ------------------------------------------------------------------- Left ventricle:  The cavity size was severely dilated. Wall thickness was increased in a pattern of moderate LVH. Systolic function was normal. The estimated ejection fraction was in the range of 55% to 60%. The study was not technically sufficient  to allow evaluation of LV diastolic dysfunction due to atrial fibrillation.  ------------------------------------------------------------------- Aortic valve:   Moderately calcified annulus. Severely thickened, severely calcified leaflets.  Doppler:   There was severe stenosis.   There was mild regurgitation.    VTI ratio of LVOT to aortic valve: 0.18. Valve area (VTI): 0.56 cm^2.  Indexed valve area (VTI): 0.25 cm^2/m^2. Peak velocity ratio of LVOT to aortic valve: 0.2. Valve area (Vmax): 0.63 cm^2. Indexed valve area (Vmax): 0.27 cm^2/m^2. Mean velocity ratio of LVOT to aortic valve: 0.18. Valve area (Vmean): 0.57 cm^2. Indexed valve area (Vmean): 0.25 cm^2/m^2.    Mean gradient (S): 51 mm Hg. Peak gradient (S): 85 mm Hg.  ------------------------------------------------------------------- Aorta:  Aortic root: The aortic root was normal in size. Ascending aorta: The ascending aorta was normal in size.  ------------------------------------------------------------------- Mitral valve:   Mildly to moderately calcified annulus. Mildly thickened, severely calcified leaflets .  Doppler:   The findings are consistent with moderate stenosis.   There was trivial regurgitation.    Valve area by pressure half-time: 1.69 cm^2. Indexed valve area by pressure half-time: 0.74 cm^2/m^2. Valve area by continuity equation (using LVOT flow): 1 cm^2. Indexed valve area by continuity equation (using LVOT flow): 0.44 cm^2/m^2. Mean gradient (D): 8 mm Hg. Peak gradient (D): 21 mm Hg.  ------------------------------------------------------------------- Left atrium:  The atrium was moderately dilated.  ------------------------------------------------------------------- Right ventricle:  The cavity size was normal. Systolic function was mildly reduced.  ------------------------------------------------------------------- Pulmonic valve:    The valve appears to be grossly normal. Doppler:  There was  no significant regurgitation.  ------------------------------------------------------------------- Tricuspid valve:   The valve appears to be grossly normal. Doppler:  There was mild-moderate regurgitation.  ------------------------------------------------------------------- Pulmonary artery:   Systolic pressure was within the normal range.   ------------------------------------------------------------------- Right atrium:  The atrium was mildly dilated.  ------------------------------------------------------------------- Pericardium:  There was no pericardial effusion.  ------------------------------------------------------------------- Systemic veins: Inferior vena cava: The vessel was dilated. The respirophasic diameter changes were blunted (< 50%), consistent with elevated central venous pressure. Diameter: 25 mm.  ------------------------------------------------------------------- Measurements   IVC                                      Value          Reference  ID                                       25    mm       ----------    Left ventricle                           Value          Reference  LV ID, ED, PLAX chordal          (H)     52.6  mm       43 - 52  LV ID, ES, PLAX chordal                  35.7  mm       23 - 38  LV fx shortening, PLAX chordal           32    %        >=29  LV PW thickness, ED                      15.1  mm       ----------  IVS/LV PW ratio, ED  0.79           <=1.3  Stroke volume, 2D                        67    ml       ----------  Stroke volume/bsa, 2D                    29    ml/m^2   ----------  LV e&', lateral                           9.25  cm/s     ----------  LV E/e&', lateral                         24.76          ----------  LV e&', medial                            7.61  cm/s     ----------  LV E/e&', medial                          30.09          ----------  LV e&', average                           8.43   cm/s     ----------  LV E/e&', average                         27.16          ----------    Ventricular septum                       Value          Reference  IVS thickness, ED                        12    mm       ----------    LVOT                                     Value          Reference  LVOT ID, S                               20    mm       ----------  LVOT area                                3.14  cm^2     ----------  LVOT peak velocity, S                    91.8  cm/s     ----------  LVOT mean velocity, S                    60.4  cm/s     ----------  LVOT VTI, S  21.2  cm       ----------    Aortic valve                             Value          Reference  Aortic valve peak velocity, S            461   cm/s     ----------  Aortic valve mean velocity, S            334   cm/s     ----------  Aortic valve VTI, S                      118   cm       ----------  Aortic mean gradient, S                  51    mm Hg    ----------  Aortic peak gradient, S                  85    mm Hg    ----------  VTI ratio, LVOT/AV                       0.18           ----------  Aortic valve area, VTI                   0.56  cm^2     ----------  Aortic valve area/bsa, VTI               0.25  cm^2/m^2 ----------  Velocity ratio, peak, LVOT/AV            0.2            ----------  Aortic valve area, peak velocity         0.63  cm^2     ----------  Aortic valve area/bsa, peak              0.27  cm^2/m^2 ----------  velocity  Velocity ratio, mean, LVOT/AV            0.18           ----------  Aortic valve area, mean velocity         0.57  cm^2     ----------  Aortic valve area/bsa, mean              0.25  cm^2/m^2 ----------  velocity  Aortic regurg pressure half-time         365   ms       ----------    Aorta                                    Value          Reference  Aortic root ID, ED                       37    mm       ----------    Left atrium                               Value  Reference  LA ID, A-P, ES                           49    mm       ----------  LA ID/bsa, A-P                           2.15  cm/m^2   <=2.2  LA volume, S                             98.5  ml       ----------  LA volume/bsa, S                         43.2  ml/m^2   ----------  LA volume, ES, 1-p A4C                   83.2  ml       ----------  LA volume/bsa, ES, 1-p A4C               36.5  ml/m^2   ----------  LA volume, ES, 1-p A2C                   98.8  ml       ----------  LA volume/bsa, ES, 1-p A2C               43.3  ml/m^2   ----------    Mitral valve                             Value          Reference  Mitral E-wave peak velocity              229   cm/s     ----------  Mitral mean velocity, D                  113   cm/s     ----------  Mitral deceleration time                 211   ms       150 - 230  Mitral pressure half-time                120   ms       ----------  Mitral mean gradient, D                  8     mm Hg    ----------  Mitral peak gradient, D                  21    mm Hg    ----------  Mitral valve area, PHT, DP               1.69  cm^2     ----------  Mitral valve area/bsa, PHT, DP           0.74  cm^2/m^2 ----------  Mitral valve area, LVOT                  1     cm^2     ----------  continuity  Mitral valve area/bsa, LVOT  0.44  cm^2/m^2 ----------  continuity  Mitral annulus VTI, D                    66.4  cm       ----------    Pulmonary arteries                       Value          Reference  PA pressure, S, DP                       20    mm Hg    <=30    Tricuspid valve                          Value          Reference  Tricuspid regurg peak velocity           176   cm/s     ----------  Tricuspid peak RV-RA gradient            12    mm Hg    ----------  Tricuspid maximal regurg                 176   cm/s     ----------  velocity, PISA    Right atrium                             Value          Reference  RA ID,  S-I, ES, A4C              (H)     59.4  mm       34 - 49  RA area, ES, A4C                 (H)     20.8  cm^2     8.3 - 19.5  RA volume, ES, A/L                       59.3  ml       ----------  RA volume/bsa, ES, A/L                   26    ml/m^2   ----------    Systemic veins                           Value          Reference  Estimated CVP                            8     mm Hg    ----------    Right ventricle                          Value          Reference  TAPSE                                    15.1  mm       ----------  RV pressure, S, DP  20    mm Hg    <=30  RV s&', lateral, S                        7.51  cm/s     ----------  Carotid Duplex 03-28-2017: Final Interpretation: Right Carotid: There is evidence in the right ICA of a 1-39% stenosis.        Non-hemodynamically significant plaque <50% noted in the CCA. The        RICA is patent with velocities remaining within normal range and        stable compared to the prior exam, s/p endartectomy.  Left Carotid: There is evidence in the left ICA of a 40-59% stenosis.       Non-hemodynamically significant plaque noted in the CCA. The LICA       velocities are elevated and stable compared to the prior exam.  Vertebrals: Both vertebral arteries were patent with antegrade flow. Subclavians: Normal flow hemodynamics were seen in bilateral subclavian       arteries.  STS Risk Calcuator: Risk of Mortality: 10.490% Renal Failure: 5.931% Permanent Stroke: 2.243% Prolonged Ventilation: 20.735% DSW Infection: 0.298% Reoperation: 3.512% Morbidity or Mortality: 30.827% Short Length of Stay: 11.995% Long Length of Stay: 18.190%  ASSESSMENT AND PLAN: 84 year old male with severe, stage D1 aortic stenosis with New York Heart Association functional class II symptoms of chronic diastolic heart failure.  I have personally reviewed the patient's echo study  which confirms preserved LV systolic function, progressive and severe aortic stenosis with a mean transvalvular gradient of 51 mmHg and severe calcification and restriction of all 3 aortic valve leaflets.  He does have calcification and thickening of the mitral leaflets with doppler findings consistent with moderate mitral stenosis. However, I do not think this is prohibitive for consideration of TAVR.  In addition, PA pressures are normal by echo assessment which argues against severe hemodynamically significant mitral stenosis. CTA of the heart will better characterize the degree of calcification in the intervalvular fibrosa and anatomic suitability for TAVR.  His most recent heart catheterization study is reviewed which demonstrates continued patency of both his mammary artery graft to the LAD and sequential saphenous vein graft to the circumflex distribution.  I have reviewed the natural history of aortic stenosis with the patient and his wife who is present today. We have discussed the limitations of medical therapy and the poor prognosis associated with symptomatic aortic stenosis. We have reviewed potential treatment options, including palliative medical therapy, conventional surgical aortic valve replacement, and transcatheter aortic valve replacement. We discussed treatment options in the context of the patient's specific comorbid medical conditions.   Considering the patient's comorbid medical conditions which include advanced age greater than 43 years old, previous cardiac surgery, permanent atrial fibrillation, cerebrovascular disease with history of carotid endarterectomy, and significant functional limitation from osteoarthritis with limited mobility, TAVR seems like a preferred treatment option for this patient.  I have reviewed the necessary evaluation which will include a right and left heart catheterization, CT angiogram studies of the heart and chest/abdomen/pelvis, and cardiac surgical  evaluation.  The patient's original heart surgery in 1994 was performed by Dr. Servando Snare.  They understand that the studies will help determine anatomic feasibility of TAVR as well as preferred access.  After his studies are completed he will continue with a multitude disciplinary approach to determine his best treatment option.  I have reviewed the risks, indications, and alternatives to cardiac catheterization, possible angioplasty,  and stenting with the patient. Risks include but are not limited to bleeding, infection, vascular injury, stroke, myocardial infection, arrhythmia, kidney injury, radiation-related injury in the case of prolonged fluoroscopy use, emergency cardiac surgery, and death. The patient understands the risks of serious complication is 1-2 in 1975 with diagnostic cardiac cath and 1-2% or less with angioplasty/stenting.   I did review the TAVR procedure with the patient and his wife today.  We reviewed potential complications, expected recovery, and expected benefits. He understands and would like to proceed with evaluation as outlined above.   Current medicines are reviewed with the patient today.  The patient does not have concerns regarding medicines.  Labs/ tests ordered today include:  No orders of the defined types were placed in this encounter.  Signed, Sherren Mocha, MD  08/08/2017 10:04 AM    [address], [place],   [address] Phone: [phone]; Fax: [phone]

## 2017-08-08 NOTE — Telephone Encounter (Signed)
Confirmed with patient's wife they will keep the 3/21 PFT appointment.

## 2017-08-08 NOTE — Telephone Encounter (Signed)
Unable to arrange PFTs during visit this AM.  Scheduled PFTs 3/21 (same day as TAVR scans) at 1000. Instructed patient to call back if that will not work.

## 2017-08-08 NOTE — Patient Instructions (Addendum)
Medication Instructions:  Your provider recommends that you continue on your current medications as directed. Please refer to the Current Medication list given to you today.    Labwork: TODAY! Pre-procedure labs  Testing/Procedures: Your physician has requested that you have a cardiac catheterization. Cardiac catheterization is used to diagnose and/or treat various heart conditions. Doctors may recommend this procedure for a number of different reasons. The most common reason is to evaluate chest pain. Chest pain can be a symptom of coronary artery disease (CAD), and cardiac catheterization can show whether plaque is narrowing or blocking your heart's arteries. This procedure is also used to evaluate the valves, as well as measure the blood flow and oxygen levels in different parts of your heart. For further information please visit [email or web address]. Please follow instruction sheet, as given.  Follow-Up: [name], the TAVR RN, will call you to arrange further appointments.   Any Other Special Instructions Will Be Listed Below (If Applicable). CATH INSTRUCTIONS:   [facility] OFFICE [address] [place] [address] Dept: [phone] Loc: [phone]  [name]  08/08/2017  You are scheduled for a Cardiac Catheterization on Wednesday, March 13 with Dr. Sherren Mocha.  1. Please arrive at the [facility] (Main Entrance A) at [facility]: [address] [place], [place] [address] at 6:30 AM (two hours before your procedure to ensure your preparation). Free valet parking service is available.   Special note: Every effort is made to have your procedure done on time. Please understand that emergencies sometimes delay scheduled procedures.  2. Diet: Do not eat or drink anything after midnight prior to your procedure except sips of water to take medications.  3. Labs: TODAY!  4.  Medication instructions in preparation for your procedure:  1) HOLD YOUR LASIX the morning of the procedure  2) HOLD PRADAXA for 2 days prior to the procedure    3) TAKE ASPIRIN 81 mg the morning of your procedure  5. Plan for one night stay--bring personal belongings. 6. Bring a current list of your medications and current insurance cards. 7. You MUST have a responsible person to drive you home. 8. Someone MUST be with you the first 24 hours after you arrive home or your discharge will be delayed. 9. Please wear clothes that are easy to get on and off and wear slip-on shoes.  Thank you for allowing [place] to care for you!   -- [place] [facility]   CT INSTRUCTIONS: Please arrive at the [facility] main entrance of [facility] at 1:00 PM on Thursday, March 21.  [facility] [address] [place], [place] [address] [phone]  Proceed to the [facility] Radiology Department (First Floor).  Please follow these instructions carefully (unless otherwise directed):  Hold all erectile dysfunction medications at least 48 hours prior to test.  On the Night Before the Test: . Drink plenty of water. . Do not consume any caffeinated/decaffeinated beverages or chocolate 12 hours prior to your test. . Do not take any antihistamines 12 hours prior to your test.  On the Day of the Test: . Drink plenty of water. Do not drink any water within one hour of the test. . Do not eat any food 4 hours prior to the test. . You may take your regular medications prior to the test. . HOLD Furosemide morning of the test.  After the Test: . Drink plenty  of water. . After receiving IV contrast, you may experience a mild flushed feeling. This is normal. . On occasion, you may experience a mild rash up to 24 hours after the test. This is not dangerous. If this occurs, you can take Benadryl 25 mg and increase your fluid intake. . If you experience trouble  breathing, this can be serious. If it is severe call 911 IMMEDIATELY. If it is mild, please call our office. . If you take any of these medications: Glipizide/Metformin, Avandament, Glucavance, please do not take 48 hours after completing test.

## 2017-08-12 ENCOUNTER — Telehealth: Payer: Self-pay | Admitting: *Deleted

## 2017-08-12 NOTE — Telephone Encounter (Signed)
Pt contacted pre-catheterization scheduled at [facility] for: Wednesday August 14, 2017 8:30 AM Verified arrival time and place: [place] Entrance A/[facility] [place] at: 6:30 AM Nothing to eat or drink after midnight prior to cath. Verified allergies in Epic.  Hold: No Pradaxa 08/12/17, 08/13/17, 08/14/17 until post cath. No metformin 08/13/17, 08/14/17 and 48 hours post cath. No furosemide AM of cath.  Except hold medications AM meds can be  taken pre-cath with sip of water including: ASA 81 mg  Confirmed patient has responsible person to drive home post procedure and observe patient for 24 hours: yes

## 2017-08-14 ENCOUNTER — Ambulatory Visit (HOSPITAL_COMMUNITY)
Admission: RE | Admit: 2017-08-14 | Discharge: 2017-08-14 | Disposition: A | Discharge status: Home / Self Care | Payer: Medicare Other | Source: Ambulatory Visit | Attending: Cardiovascular Disease | Admitting: Cardiovascular Disease

## 2017-08-14 ENCOUNTER — Encounter (HOSPITAL_COMMUNITY): Payer: Self-pay | Admitting: Cardiovascular Disease

## 2017-08-14 ENCOUNTER — Ambulatory Visit (HOSPITAL_COMMUNITY)
Admission: RE | Disposition: A | Discharge status: Home / Self Care | Payer: Self-pay | Source: Ambulatory Visit | Attending: Cardiovascular Disease

## 2017-08-14 DIAGNOSIS — Z9981 Dependence on supplemental oxygen: Secondary | ICD-10-CM | POA: Insufficient documentation

## 2017-08-14 DIAGNOSIS — I350 Nonrheumatic aortic (valve) stenosis: Secondary | ICD-10-CM

## 2017-08-14 DIAGNOSIS — F419 Anxiety disorder, unspecified: Secondary | ICD-10-CM | POA: Diagnosis not present

## 2017-08-14 DIAGNOSIS — I2582 Chronic total occlusion of coronary artery: Secondary | ICD-10-CM | POA: Diagnosis not present

## 2017-08-14 DIAGNOSIS — Z951 Presence of aortocoronary bypass graft: Secondary | ICD-10-CM | POA: Insufficient documentation

## 2017-08-14 DIAGNOSIS — M1990 Unspecified osteoarthritis, unspecified site: Secondary | ICD-10-CM | POA: Insufficient documentation

## 2017-08-14 DIAGNOSIS — I482 Chronic atrial fibrillation: Secondary | ICD-10-CM | POA: Diagnosis not present

## 2017-08-14 DIAGNOSIS — G4730 Sleep apnea, unspecified: Secondary | ICD-10-CM | POA: Insufficient documentation

## 2017-08-14 DIAGNOSIS — I2510 Atherosclerotic heart disease of native coronary artery without angina pectoris: Secondary | ICD-10-CM | POA: Insufficient documentation

## 2017-08-14 DIAGNOSIS — F329 Major depressive disorder, single episode, unspecified: Secondary | ICD-10-CM | POA: Insufficient documentation

## 2017-08-14 DIAGNOSIS — I272 Pulmonary hypertension, unspecified: Secondary | ICD-10-CM | POA: Insufficient documentation

## 2017-08-14 DIAGNOSIS — Z8711 Personal history of peptic ulcer disease: Secondary | ICD-10-CM | POA: Insufficient documentation

## 2017-08-14 DIAGNOSIS — Z95820 Peripheral vascular angioplasty status with implants and grafts: Secondary | ICD-10-CM | POA: Insufficient documentation

## 2017-08-14 DIAGNOSIS — Z882 Allergy status to sulfonamides status: Secondary | ICD-10-CM | POA: Insufficient documentation

## 2017-08-14 DIAGNOSIS — E1151 Type 2 diabetes mellitus with diabetic peripheral angiopathy without gangrene: Secondary | ICD-10-CM | POA: Insufficient documentation

## 2017-08-14 DIAGNOSIS — Z79899 Other long term (current) drug therapy: Secondary | ICD-10-CM | POA: Insufficient documentation

## 2017-08-14 DIAGNOSIS — I110 Hypertensive heart disease with heart failure: Secondary | ICD-10-CM | POA: Insufficient documentation

## 2017-08-14 DIAGNOSIS — Z7901 Long term (current) use of anticoagulants: Secondary | ICD-10-CM | POA: Insufficient documentation

## 2017-08-14 DIAGNOSIS — K219 Gastro-esophageal reflux disease without esophagitis: Secondary | ICD-10-CM | POA: Insufficient documentation

## 2017-08-14 DIAGNOSIS — J449 Chronic obstructive pulmonary disease, unspecified: Secondary | ICD-10-CM | POA: Diagnosis not present

## 2017-08-14 DIAGNOSIS — I509 Heart failure, unspecified: Secondary | ICD-10-CM | POA: Diagnosis not present

## 2017-08-14 DIAGNOSIS — E785 Hyperlipidemia, unspecified: Secondary | ICD-10-CM | POA: Insufficient documentation

## 2017-08-14 DIAGNOSIS — I252 Old myocardial infarction: Secondary | ICD-10-CM | POA: Diagnosis not present

## 2017-08-14 DIAGNOSIS — Z87891 Personal history of nicotine dependence: Secondary | ICD-10-CM | POA: Diagnosis not present

## 2017-08-14 DIAGNOSIS — R011 Cardiac murmur, unspecified: Secondary | ICD-10-CM | POA: Insufficient documentation

## 2017-08-14 DIAGNOSIS — D649 Anemia, unspecified: Secondary | ICD-10-CM | POA: Insufficient documentation

## 2017-08-14 DIAGNOSIS — Z881 Allergy status to other antibiotic agents status: Secondary | ICD-10-CM | POA: Insufficient documentation

## 2017-08-14 DIAGNOSIS — Z8249 Family history of ischemic heart disease and other diseases of the circulatory system: Secondary | ICD-10-CM | POA: Insufficient documentation

## 2017-08-14 HISTORY — PX: RIGHT/LEFT HEART CATH AND CORONARY/GRAFT ANGIOGRAPHY: CATH118267

## 2017-08-14 HISTORY — PX: AORTIC ARCH ANGIOGRAPHY: CATH118224

## 2017-08-14 LAB — POCT I-STAT 3, VENOUS BLOOD GAS (G3P V)
Bicarbonate: 26.1 mmol/L (ref 20.0–28.0)
O2 SAT: 57 %
PCO2 VEN: 48.6 mmHg (ref 44.0–60.0)
PO2 VEN: 32 mmHg (ref 32.0–45.0)
TCO2: 28 mmol/L (ref 22–32)
pH, Ven: 7.338 (ref 7.250–7.430)

## 2017-08-14 LAB — POCT I-STAT 3, ART BLOOD GAS (G3+)
Bicarbonate: 25.7 mmol/L (ref 20.0–28.0)
O2 Saturation: 94 %
PO2 ART: 77 mmHg — AB (ref 83.0–108.0)
TCO2: 27 mmol/L (ref 22–32)
pCO2 arterial: 47 mmHg (ref 32.0–48.0)
pH, Arterial: 7.346 — ABNORMAL LOW (ref 7.350–7.450)

## 2017-08-14 LAB — GLUCOSE, CAPILLARY
GLUCOSE-CAPILLARY: 133 mg/dL — AB (ref 65–99)
GLUCOSE-CAPILLARY: 135 mg/dL — AB (ref 65–99)

## 2017-08-14 SURGERY — RIGHT/LEFT HEART CATH AND CORONARY/GRAFT ANGIOGRAPHY
Anesthesia: LOCAL

## 2017-08-14 MED ORDER — SODIUM CHLORIDE 0.9% FLUSH: 3.0000 mL | Freq: Two times a day (BID) | INTRAVENOUS | Status: DC

## 2017-08-14 MED ORDER — VERAPAMIL HCL 2.5 MG/ML IV SOLN
INTRAVENOUS | Status: DC | PRN
  Administered 2017-08-14: 09:00:00 via INTRA_ARTERIAL

## 2017-08-14 MED ORDER — FENTANYL CITRATE (PF) 100 MCG/2ML IJ SOLN
INTRAMUSCULAR | Status: AC
  Filled 2017-08-14: qty 2

## 2017-08-14 MED ORDER — MIDAZOLAM HCL 2 MG/2ML IJ SOLN
INTRAMUSCULAR | Status: DC | PRN
  Administered 2017-08-14: 1 mg via INTRAVENOUS

## 2017-08-14 MED ORDER — HEPARIN (PORCINE) IN NACL 2-0.9 UNIT/ML-% IJ SOLN
INTRAMUSCULAR | Status: AC
  Filled 2017-08-14: qty 1000

## 2017-08-14 MED ORDER — HEPARIN (PORCINE) IN NACL 2-0.9 UNIT/ML-% IJ SOLN
INTRAMUSCULAR | Status: AC | PRN
  Administered 2017-08-14 (×2): 500 mL

## 2017-08-14 MED ORDER — FENTANYL CITRATE (PF) 100 MCG/2ML IJ SOLN
INTRAMUSCULAR | Status: DC | PRN
  Administered 2017-08-14: 25 ug via INTRAVENOUS

## 2017-08-14 MED ORDER — SODIUM CHLORIDE 0.9 % WEIGHT BASED INFUSION: 1.0000 mL/kg/h | INTRAVENOUS | Status: DC

## 2017-08-14 MED ORDER — ASPIRIN 81 MG PO CHEW: 81.0000 mg | CHEWABLE_TABLET | ORAL | Status: DC

## 2017-08-14 MED ORDER — LIDOCAINE HCL (PF) 1 % IJ SOLN
INTRAMUSCULAR | Status: AC
  Filled 2017-08-14: qty 30

## 2017-08-14 MED ORDER — SODIUM CHLORIDE 0.9 % IV SOLN: 250.0000 mL | INTRAVENOUS | Status: DC | PRN

## 2017-08-14 MED ORDER — FUROSEMIDE 20 MG PO TABS: 40.0000 mg | ORAL_TABLET | Freq: Every day | ORAL | 5 refills | Status: DC

## 2017-08-14 MED ORDER — IOPAMIDOL (ISOVUE-370) INJECTION 76%
INTRAVENOUS | Status: DC | PRN
  Administered 2017-08-14: 70 mL via INTRA_ARTERIAL

## 2017-08-14 MED ORDER — MIDAZOLAM HCL 2 MG/2ML IJ SOLN
INTRAMUSCULAR | Status: AC
  Filled 2017-08-14: qty 2

## 2017-08-14 MED ORDER — SODIUM CHLORIDE 0.9% FLUSH: 3.0000 mL | INTRAVENOUS | Status: DC | PRN

## 2017-08-14 MED ORDER — HEPARIN SODIUM (PORCINE) 1000 UNIT/ML IJ SOLN
INTRAMUSCULAR | Status: AC
  Filled 2017-08-14: qty 1

## 2017-08-14 MED ORDER — HEPARIN SODIUM (PORCINE) 1000 UNIT/ML IJ SOLN
INTRAMUSCULAR | Status: DC | PRN
  Administered 2017-08-14: 4000 [IU] via INTRAVENOUS

## 2017-08-14 MED ORDER — IOPAMIDOL (ISOVUE-370) INJECTION 76%
INTRAVENOUS | Status: AC
  Filled 2017-08-14: qty 125

## 2017-08-14 MED ORDER — VERAPAMIL HCL 2.5 MG/ML IV SOLN
INTRAVENOUS | Status: AC
  Filled 2017-08-14: qty 2

## 2017-08-14 MED ORDER — SODIUM CHLORIDE 0.9 % WEIGHT BASED INFUSION
3.0000 mL/kg/h | INTRAVENOUS | Status: AC
  Administered 2017-08-14: 3 mL/kg/h via INTRAVENOUS

## 2017-08-14 MED ORDER — LIDOCAINE HCL (PF) 1 % IJ SOLN
INTRAMUSCULAR | Status: DC | PRN
  Administered 2017-08-14 (×2): 2 mL

## 2017-08-14 SURGICAL SUPPLY — 16 items
BAND CMPR LRG ZPHR (HEMOSTASIS) ×1
BAND ZEPHYR COMPRESS 30 LONG (HEMOSTASIS) ×1 IMPLANT
CATH BALLN WEDGE 5F 110CM (CATHETERS) ×1 IMPLANT
CATH INFINITI 5FR AL1 (CATHETERS) ×1 IMPLANT
CATH INFINITI 5FR MULTPACK ANG (CATHETERS) ×1 IMPLANT
GUIDEWIRE INQWIRE 1.5J.035X260 (WIRE) IMPLANT
INQWIRE 1.5J .035X260CM (WIRE) ×2
KIT HEART LEFT (KITS) ×2 IMPLANT
NDL PERC 21GX4CM (NEEDLE) IMPLANT
NEEDLE PERC 21GX4CM (NEEDLE) ×2 IMPLANT
PACK CARDIAC CATHETERIZATION (CUSTOM PROCEDURE TRAY) ×2 IMPLANT
SHEATH GLIDE SLENDER 4/5FR (SHEATH) ×1 IMPLANT
SYR MEDRAD MARK V 150ML (SYRINGE) ×1 IMPLANT
TRANSDUCER W/STOPCOCK (MISCELLANEOUS) ×2 IMPLANT
TUBING CIL FLEX 10 FLL-RA (TUBING) ×2 IMPLANT
WIRE MICROINTRODUCER 60CM (WIRE) ×1 IMPLANT

## 2017-08-14 NOTE — Interval H&P Note (Signed)
History and Physical Interval Note:  08/14/2017 8:39 AM  [name]  has presented today for surgery, with the diagnosis of aortic stenosis  The various methods of treatment have been discussed with the patient and family. After consideration of risks, benefits and other options for treatment, the patient has consented to  Procedure(s): RIGHT/LEFT HEART CATH AND CORONARY/GRAFT ANGIOGRAPHY (N/A) as a surgical intervention .  The patient's history has been reviewed, patient examined, no change in status, stable for surgery.  I have reviewed the patient's chart and labs.  Questions were answered to the patient's satisfaction.     Sherren Mocha

## 2017-08-14 NOTE — Progress Notes (Signed)
R brachial sheath pulled, pressure held 10 minutes, level 0, VSS. Gauze dsg; clean, dry, intact.

## 2017-08-14 NOTE — Discharge Instructions (Signed)
Radial Site Care Refer to this sheet in the next few weeks. These instructions provide you with information about caring for yourself after your procedure. Your health care provider may also give you more specific instructions. Your treatment has been planned according to current medical practices, but problems sometimes occur. Call your health care provider if you have any problems or questions after your procedure. What can I expect after the procedure? After your procedure, it is typical to have the following:  Bruising at the radial site that usually fades within 1-2 weeks.  Blood collecting in the tissue (hematoma) that may be painful to the touch. It should usually decrease in size and tenderness within 1-2 weeks.  Follow these instructions at home:  Take medicines only as directed by your health care provider.  You may shower 24-48 hours after the procedure or as directed by your health care provider. Remove the bandage (dressing) and gently wash the site with plain soap and water. Pat the area dry with a clean towel. Do not rub the site, because this may cause bleeding.  Do not take baths, swim, or use a hot tub until your health care provider approves.  Check your insertion site every day for redness, swelling, or drainage.  Do not apply powder or lotion to the site.  Do not flex or bend the affected arm for 24 hours or as directed by your health care provider.  Do not push or pull heavy objects with the affected arm for 24 hours or as directed by your health care provider.  Do not lift over 5 lb (4.5 kg) for 5 days after your procedure or as directed by your health care provider.  Ask your health care provider when it is okay to: ? Return to work or school. ? Resume usual physical activities or sports. ? Resume sexual activity.  Do not drive home if you are discharged the same day as the procedure. Have someone else drive you.  You may drive 24 hours after the procedure  unless otherwise instructed by your health care provider.  Do not operate machinery or power tools for 24 hours after the procedure.  If your procedure was done as an outpatient procedure, which means that you went home the same day as your procedure, a responsible adult should be with you for the first 24 hours after you arrive home.  Keep all follow-up visits as directed by your health care provider. This is important. Contact a health care provider if:  You have a fever.  You have chills.  You have increased bleeding from the radial site. Hold pressure on the site. Get help right away if:  You have unusual pain at the radial site.  You have redness, warmth, or swelling at the radial site.  You have drainage (other than a small amount of blood on the dressing) from the radial site.  The radial site is bleeding, and the bleeding does not stop after 30 minutes of holding steady pressure on the site.  Your arm or hand becomes pale, cool, tingly, or numb. This information is not intended to replace advice given to you by your health care provider. Make sure you discuss any questions you have with your health care provider. Document Released: 06/23/2010 Document Revised: 10/27/2015 Document Reviewed: 12/07/2013 Elsevier Interactive Patient Education  2018 [employer].

## 2017-08-15 MED FILL — Heparin Sodium (Porcine) 2 Unit/ML in Sodium Chloride 0.9%: INTRAMUSCULAR | Qty: 1000 | Status: AC

## 2017-08-22 ENCOUNTER — Ambulatory Visit (HOSPITAL_COMMUNITY)
Admission: RE | Admit: 2017-08-22 | Discharge: 2017-08-22 | Disposition: A | Discharge status: Home / Self Care | Payer: Medicare Other | Source: Ambulatory Visit | Attending: Cardiovascular Disease | Admitting: Cardiovascular Disease

## 2017-08-22 DIAGNOSIS — Z951 Presence of aortocoronary bypass graft: Secondary | ICD-10-CM | POA: Diagnosis not present

## 2017-08-22 DIAGNOSIS — Z952 Presence of prosthetic heart valve: Secondary | ICD-10-CM | POA: Diagnosis not present

## 2017-08-22 DIAGNOSIS — J90 Pleural effusion, not elsewhere classified: Secondary | ICD-10-CM | POA: Insufficient documentation

## 2017-08-22 DIAGNOSIS — I700 Atherosclerosis of aorta: Secondary | ICD-10-CM | POA: Diagnosis not present

## 2017-08-22 DIAGNOSIS — I350 Nonrheumatic aortic (valve) stenosis: Secondary | ICD-10-CM

## 2017-08-22 DIAGNOSIS — I2510 Atherosclerotic heart disease of native coronary artery without angina pectoris: Secondary | ICD-10-CM | POA: Insufficient documentation

## 2017-08-22 DIAGNOSIS — N2889 Other specified disorders of kidney and ureter: Secondary | ICD-10-CM | POA: Diagnosis not present

## 2017-08-22 LAB — PULMONARY FUNCTION TEST
DL/VA % PRED: 54 %
DL/VA: 2.56 ml/min/mmHg/L
DLCO COR: 10.57 ml/min/mmHg
DLCO UNC % PRED: 28 %
DLCO cor % pred: 30 %
DLCO unc: 9.93 ml/min/mmHg
FEF 25-75 POST: 1.96 L/s
FEF 25-75 PRE: 0.84 L/s
FEF2575-%CHANGE-POST: 132 %
FEF2575-%PRED-POST: 99 %
FEF2575-%PRED-PRE: 42 %
FEV1-%Change-Post: 22 %
FEV1-%PRED-PRE: 48 %
FEV1-%Pred-Post: 59 %
FEV1-Post: 1.77 L
FEV1-Pre: 1.45 L
FEV1FVC-%CHANGE-POST: 7 %
FEV1FVC-%PRED-PRE: 97 %
FEV6-%CHANGE-POST: 13 %
FEV6-%PRED-PRE: 53 %
FEV6-%Pred-Post: 60 %
FEV6-Post: 2.38 L
FEV6-Pre: 2.09 L
FEV6FVC-%Change-Post: 0 %
FEV6FVC-%Pred-Post: 107 %
FEV6FVC-%Pred-Pre: 107 %
FVC-%Change-Post: 13 %
FVC-%PRED-POST: 56 %
FVC-%PRED-PRE: 49 %
FVC-POST: 2.38 L
FVC-PRE: 2.1 L
POST FEV6/FVC RATIO: 100 %
PRE FEV1/FVC RATIO: 69 %
Post FEV1/FVC ratio: 74 %
Pre FEV6/FVC Ratio: 100 %
RV % pred: 126 %
RV: 3.61 L
TLC % pred: 76 %
TLC: 5.74 L

## 2017-08-22 MED ORDER — ALBUTEROL SULFATE (2.5 MG/3ML) 0.083% IN NEBU
2.5000 mg | INHALATION_SOLUTION | Freq: Once | RESPIRATORY_TRACT | Status: AC
  Administered 2017-08-22: 2.5 mg via RESPIRATORY_TRACT

## 2017-08-22 MED ORDER — IOPAMIDOL (ISOVUE-370) INJECTION 76%
INTRAVENOUS | Status: AC
  Administered 2017-08-22: 80 mL via INTRAVENOUS
  Filled 2017-08-22: qty 100

## 2017-08-23 ENCOUNTER — Telehealth: Payer: Self-pay

## 2017-08-23 NOTE — Telephone Encounter (Signed)
Pre TAVR patient   Incidental finding on his CT ANGIO ABD/PEL W/CM AND/OR W [[id number]].  Per Dr. Lina Sar ulcerated enhancing gastric mass along the proximal lesser curvature of the stomach.  Further eval with nonemergent endoscopy in near future to exclude possible gastric neoplasm and/or ulcer.  Calling nurse states results are in [place].  Forwarding information to ordering.

## 2017-08-26 ENCOUNTER — Encounter: Payer: Self-pay | Admitting: Physical Therapy

## 2017-08-26 ENCOUNTER — Ambulatory Visit: Payer: Medicare Other | Attending: Cardiovascular Disease | Admitting: Physical Therapy

## 2017-08-26 ENCOUNTER — Other Ambulatory Visit: Payer: Self-pay

## 2017-08-26 DIAGNOSIS — R2689 Other abnormalities of gait and mobility: Secondary | ICD-10-CM | POA: Insufficient documentation

## 2017-08-26 NOTE — Therapy (Signed)
[place], [place], [address] Phone: [phone]   Fax:  [phone]  Physical Therapy Evaluation  Patient Details  Name: [name] MRN: [id number] Date of Birth: 1931-10-08 Referring Provider: Sherren Mocha, MD   Encounter Date: 08/26/2017  PT End of Session - 08/26/17 1236    Visit Number  1    Authorization Type  one-time TAVR    PT Start Time  1236    PT Stop Time  1315    PT Time Calculation (min)  39 min    Activity Tolerance  Patient tolerated treatment well    Behavior During Therapy  [facility] for tasks assessed/performed       Past Medical History:  Diagnosis Date  . Acute myocardial infarction, unspecified site, initial episode of care   . Anemia   . Anxiety   . Aortic stenosis    . Atrial fibrillation ([place])    a. permanent, on Coumadin for anticoagulation  . CAD (coronary artery disease)    a. s/p CABG in 1994 b. cath in 2015 showing normal LM, 100% LCx and RCA stenosis with 50-60% prox LAD stenosis and 100% mid-LAD stenosis; patent sequential SVG-OM2-OM3 and patent LIMA-LAD with PTCA of distal LAD via LIMA graft performed at that time  . Carotid stenosis   . CHF (congestive heart failure) ([place])   . COPD (chronic obstructive pulmonary disease) ([place])   . Depression   . Diabetes mellitus without complication ([place])    FASTING 98-120S  . GERD (gastroesophageal reflux disease)   . History of blood transfusion   . History of peptic ulcer disease   . Hyperlipidemia   . Hypertension   . Myocardial infarction ([place])    X2  . Peripheral vascular disease ([place])   . Pneumonia    HX OF  . Renal artery stenosis ([place])   . Sleep apnea    OXYGEN AT NIGHT 2L [place]    Past Surgical History:  Procedure Laterality Date  . AORTIC ARCH ANGIOGRAPHY N/A 08/14/2017   Procedure: AORTIC ARCH ANGIOGRAPHY;  Surgeon: Sherren Mocha, MD;  Location: [place] CV LAB;  Service: Cardiovascular;  Laterality:  N/A;  . CAROTID ENDARTERECTOMY    . CORONARY ARTERY BYPASS GRAFT  1994  . ENDARTERECTOMY Right 01/05/2013   Procedure: ENDARTERECTOMY CAROTID;  Surgeon: Rosetta Posner, MD;  Location: [facility];  Service: Vascular;  Laterality: Right;  . KNEE SURGERY  08/2003   left knee/ARTHROSCOPIC  . LEFT HEART CATHETERIZATION WITH CORONARY/GRAFT ANGIOGRAM N/A 09/01/2013   Procedure: LEFT HEART CATHETERIZATION WITH Beatrix Fetters;  Surgeon: Sinclair Grooms, MD;  Location: [facility] CATH LAB;  Service: Cardiovascular;  Laterality: N/A;  . PATCH ANGIOPLASTY Right 01/05/2013   Procedure: PATCH ANGIOPLASTY;  Surgeon: Rosetta Posner, MD;  Location: [place];  Service: Vascular;  Laterality: Right;  . PERCUTANEOUS CORONARY STENT INTERVENTION (PCI-S)  09/01/2013   Procedure: PERCUTANEOUS CORONARY STENT INTERVENTION (PCI-S);  Surgeon: Sinclair Grooms, MD;  Location: [facility] CATH LAB;  Service: Cardiovascular;;  . removal of bleeding ulcer  1994  . RENAL ARTERY STENT     stenting of the left renal artery as well as a cutting balloon angioplasty for treatment of in-stent restenosis coronary artery bypass grafting in 1994.  [name] RIGHT/LEFT HEART CATH AND CORONARY/GRAFT ANGIOGRAPHY N/A 08/14/2017   Procedure: RIGHT/LEFT HEART CATH AND CORONARY/GRAFT ANGIOGRAPHY;  Surgeon: Sherren Mocha, MD;  Location: [place] CV LAB;  Service: Cardiovascular;  Laterality: N/A;  There were no vitals filed for this visit.   Subjective Assessment - 08/26/17 1243    Subjective  Pt reports Rt hip and lower back as well as Lt knee pain on a regular basis. "I run out of gas when I am out and about". 2L home O2 PRN, mostly at night. Nebulizer 2-3/day.     Currently in Pain?  Yes    Pain Score  5     Pain Location  Back    Pain Orientation  Right;Lower    Pain Descriptors / Indicators  Sore    Aggravating Factors   laying still- was in CT machine         [facility] PT Assessment - 08/26/17 0001      Assessment   Medical Diagnosis  severe aortic  stenosis    Referring Provider  Sherren Mocha, MD    Hand Dominance  Right      Precautions   Precautions  None      Restrictions   Weight Bearing Restrictions  No      Balance Screen   Has the patient fallen in the past 6 months  No      [place] residence    Living Arrangements  Spouse/significant other    Additional Comments  steps to navigate at home      Prior Function   Level of Independence  Independent      Cognition   Overall Cognitive Status  Within Functional Limits for tasks assessed      ROM / Strength   AROM / PROM / Strength  Strength      Strength   Overall Strength Comments  gross 5/5 except: Lt hip flexion 4/5, Lt knee flexion 4/5    Strength Assessment Site  Hand    Right/Left hand  Right;Left    Right Hand Grip (lbs)  50    Left Hand Grip (lbs)  55       [facility] Pre-Surgical Assessment - 08/26/17 0001    5 Meter Walk Test- trial 1  6 sec    5 Meter Walk Test- trial 2  7 sec.     5 Meter Walk Test- trial 3  6 sec.    5 meter walk test average  6.33 sec    4 Stage Balance Test tolerated for:   10 sec.    4 Stage Balance Test Position  2    Sit To Stand Test- trial 1  33 sec.    Comment  norm 14.8s, BORG 15    6 Minute Walk- Baseline  yes    BP (mmHg)  134/49    HR (bpm)  68    02 Sat (%RA)  98 %    6 Minute Walk Post Test  yes    BP (mmHg)  156/67    HR (bpm)  101    02 Sat (%RA)  92 %    Modified Borg Scale for Dyspnea  5- Strong or hard breathing    Perceived Rate of Exertion (Borg)  15- Hard    Aerobic Endurance Distance Walked  751    Endurance additional comments  45.1% disability            No data recorded  Objective measurements completed on examination: See above findings.      [place] Adult PT Treatment/Exercise - 08/26/17 0001      Ambulation/Gait   Gait Comments  SPC in Rt hand  Posture/Postural Control   Posture Comments  [facility]                           Plan - 08/26/17 1442    Clinical Impression Statement  See below    PT Frequency  -- one-time TAVR evaluation    Consulted and Agree with Plan of Care  Patient       Patient demonstrated the following deficits and impairments:     Visit Diagnosis: Other abnormalities of gait and mobility - Plan: PT plan of care cert/re-cert  Clinical Impression Statement: Pt is a 84 yo M presenting to OP PT for evaluation prior to possible TAVR surgery due to severe aortic stenosis. Symptoms are limiting community mobility.  Pt ambulated 370 feet in 2:14 before requesting a seated rest beak lasting 3:46-2:10 due to right hip pain. At time of rest, patient's HR was 86 bpm and O2 was 91 on room air. Pt reported 3/10 shortness of breath on modified scale for dyspnea. Pt able to resume after rest and ambulate an additional 381 feet. Pt ambulated a total of 751 feet in 6 minute walk.Based on the Short Physical Performance Battery, patient has a frailty rating of 6/12 with </= 5/12 considered frail.      Problem List Patient Active Problem List   Diagnosis Date Noted  . Aftercare following surgery of the circulatory system 03/15/2014  . Carotid stenosis 03/15/2014  . Unstable angina ([place]) 08/31/2013  . Acute myocardial infarction, unspecified site, initial episode of care   . Aortic stenosis   . Atrial fibrillation ([place])   . Vertigo 06/27/2013  . Chest pain with moderate risk of acute coronary syndrome 06/27/2013  . CAD - CABG '94, low risk Myoview May 2014 06/27/2013  . Chronic atrial fibrillation ([place]) 06/27/2013  . Chronic anticoagulation 06/27/2013  . Gout attack- on steroid dose pack 06/27/2013  . Tachycardia- beta blocker increased 06/26/2013  . Essential hypertension, benign 03/10/2009  . HYPERLIPIDEMIA 09/09/2008  . UNSPECIFIED ANEMIA 09/09/2008  . AORTIC STENOSIS- mild 09/09/2008  . CAROTID STENOSIS- moderate 09/09/2008  . UNSPECIFIED  CEREBROVASCULAR DISEASE 09/09/2008  . RENAL ARTERY STENOSIS- moderate 09/09/2008  . PERIPHERAL VASCULAR DISEASE 09/09/2008  . PEPTIC ULCER DISEASE, HX OF 09/09/2008   Vernessa Likes C. Papa [place] PT, DPT 08/26/17 2:45 PM   [facility] [address] [place], [place], [address] Phone: [phone]   Fax:  [phone]  Name: [name] MRN: [id number] Date of Birth: 12-May-1932

## 2017-09-04 ENCOUNTER — Encounter: Payer: Medicare Other | Admitting: Surgery

## 2017-09-06 ENCOUNTER — Encounter: Payer: Self-pay | Admitting: Surgery

## 2017-09-06 ENCOUNTER — Institutional Professional Consult (permissible substitution) (INDEPENDENT_AMBULATORY_CARE_PROVIDER_SITE_OTHER): Payer: Medicare Other | Admitting: Surgery

## 2017-09-06 VITALS — BP 118/61 | HR 68 | Resp 20 | Ht 71.0 in | Wt 222.0 lb

## 2017-09-06 DIAGNOSIS — I350 Nonrheumatic aortic (valve) stenosis: Secondary | ICD-10-CM

## 2017-09-06 DIAGNOSIS — Z951 Presence of aortocoronary bypass graft: Secondary | ICD-10-CM | POA: Diagnosis not present

## 2017-09-06 DIAGNOSIS — I2510 Atherosclerotic heart disease of native coronary artery without angina pectoris: Secondary | ICD-10-CM | POA: Diagnosis not present

## 2017-09-06 NOTE — Progress Notes (Signed)
Patient ID: [name], male   DOB: 1932-02-09, 84 y.o.   MRN: [id number]  [facility] SURGERY CONSULTATION REPORT  Referring Provider is Stanford Breed, Denice Bors, MD PCP is Nicoletta Dress, MD  Chief Complaint  Patient presents with  . Aortic Stenosis    Surgical eval for TAVR, review all studies, HX of CABG    HPI:  The patient is an 84 year old gentleman with history of hypertension, hyperlipidemia, diabetes, permanent atrial fibrillation on Pradaxa, status post right carotid endarterectomy, status post renal artery stenting, and status post coronary bypass graft surgery x3 by Dr. Servando Snare in 1994.  At that time he had a left internal mammary graft to the LAD and a sequential saphenous vein graft to 2 marginal branches.  He had a cardiac catheterization in 2015 that showed a severe stenosis of the distal LAD beyond the LIMA insertion site and this was treated with PTCA without complication.  He has had a heart murmur for many years and has been followed for moderate aortic stenosis and moderate mitral stenosis.  His mean aortic valve gradient one year ago was 46 mmHg.  His most recent echo on 07/29/2017 showed a mean aortic valve gradient of 51 mmHg with a peak of 85 mmHg.  The peak velocity ratio was 0.2.  The mean gradient across the mitral valve was 8 mmHg with a peak of 21 mmHg.  Left ventricular ejection fraction was 55-60% with moderate left ventricular hypertrophy.  He underwent cardiac catheterization on 08/14/2017 showing severe multivessel coronary disease with chronic total occlusion of all 3 of his native vessels.  There was a patent LIMA to the LAD and continued patency of the sequential saphenous vein graft to marginal branches.  There is moderate stenosis of the apical LAD at the previous angioplasty site.  There is moderate to severe pulmonary hypertension with a PA pressure of 69/27 with a mean wedge pressure of 25  mmHg.  The LVEDP was 22 mmHg.  The patient is here today with his wife.  He reports progressive exertional shortness of breath and fatigue particularly with walking up hills or up stairs.  He has no symptoms at rest.  He can move around his house without symptoms.  His wife said that if he goes out to the grocery store he sits in the car.  He denies any chest discomfort or pain.  He denies any dizziness or syncope.  He has had some peripheral edema for a long time and wears compression socks.  He has been on oxygen at home for the past 6-7 years and uses it regularly at night but only occasionally during the day.  His activity is limited by arthritis involving his knees and right hip.  Past Medical History:  Diagnosis Date  . Acute myocardial infarction, unspecified site, initial episode of care   . Anemia   . Anxiety   . Aortic stenosis    . Atrial fibrillation ([place])    a. permanent, on Coumadin for anticoagulation  . CAD (coronary artery disease)    a. s/p CABG in 1994 b. cath in 2015 showing normal LM, 100% LCx and RCA stenosis with 50-60% prox LAD stenosis and 100% mid-LAD stenosis; patent sequential SVG-OM2-OM3 and patent LIMA-LAD with PTCA of distal LAD via LIMA graft performed at that time  . Carotid stenosis   . CHF (congestive heart failure) ([place])   . COPD (chronic obstructive pulmonary disease) ([place])   .  Depression   . Diabetes mellitus without complication ([place])    FASTING 98-120S  . GERD (gastroesophageal reflux disease)   . History of blood transfusion   . History of peptic ulcer disease   . Hyperlipidemia   . Hypertension   . Myocardial infarction ([place])    X2  . Peripheral vascular disease ([place])   . Pneumonia    HX OF  . Renal artery stenosis ([place])   . Sleep apnea    OXYGEN AT NIGHT 2L [place]    Past Surgical History:  Procedure Laterality Date  . AORTIC ARCH ANGIOGRAPHY N/A 08/14/2017   Procedure: AORTIC ARCH ANGIOGRAPHY;  Surgeon: Sherren Mocha, MD;  Location: [facility];  Service: Cardiovascular;  Laterality: N/A;  . CAROTID ENDARTERECTOMY    . CORONARY ARTERY BYPASS GRAFT  1994  . ENDARTERECTOMY Right 01/05/2013   Procedure: ENDARTERECTOMY CAROTID;  Surgeon: Rosetta Posner, MD;  Location: [facility];  Service: Vascular;  Laterality: Right;  . KNEE SURGERY  08/2003   left knee/ARTHROSCOPIC  . LEFT HEART CATHETERIZATION WITH CORONARY/GRAFT ANGIOGRAM N/A 09/01/2013   Procedure: LEFT HEART CATHETERIZATION WITH Beatrix Fetters;  Surgeon: Sinclair Grooms, MD;  Location: [facility];  Service: Cardiovascular;  Laterality: N/A;  . PATCH ANGIOPLASTY Right 01/05/2013   Procedure: PATCH ANGIOPLASTY;  Surgeon: Rosetta Posner, MD;  Location: [facility];  Service: Vascular;  Laterality: Right;  . PERCUTANEOUS CORONARY STENT INTERVENTION (PCI-S)  09/01/2013   Procedure: PERCUTANEOUS CORONARY STENT INTERVENTION (PCI-S);  Surgeon: Sinclair Grooms, MD;  Location: [facility];  Service: Cardiovascular;;  . removal of bleeding ulcer  1994  . RENAL ARTERY STENT     stenting of the left renal artery as well as a cutting balloon angioplasty for treatment of in-stent restenosis coronary artery bypass grafting in 1994.  [name] RIGHT/LEFT HEART CATH AND CORONARY/GRAFT ANGIOGRAPHY N/A 08/14/2017   Procedure: RIGHT/LEFT HEART CATH AND CORONARY/GRAFT ANGIOGRAPHY;  Surgeon: Sherren Mocha, MD;  Location: [facility];  Service: Cardiovascular;  Laterality: N/A;    Family History  Problem Relation Age of Onset  . Coronary artery disease Mother   . Heart disease Mother        Before age 19  . Hyperlipidemia Mother   . Hypertension Mother   . Heart attack Mother   . Coronary artery disease Father   . Heart disease Father        After age 76  . Heart attack Father   . Heart disease Sister        After age 76  . Heart disease Brother        After age 33  . Coronary artery disease Unknown        13 sibling, almost all have coronary disease and some with premature onset    . Heart disease Unknown        13 sibling, almost all have coronary disease and some with premature onset    Social History   Socioeconomic History  . Marital status: Married    Spouse name: Not on file  . Number of children: Not on file  . Years of education: Not on file  . Highest education level: Not on file  Occupational History  . Not on file  Social Needs  . Financial resource strain: Not on file  . Food insecurity:    Worry: Not on file    Inability: Not on file  . Transportation needs:    Medical:  Not on file    Non-medical: Not on file  Tobacco Use  . Smoking status: Former Smoker    Last attempt to quit: 04/27/1993    Years since quitting: 24.3  . Smokeless tobacco: Never Used  Substance and Sexual Activity  . Alcohol use: No    Alcohol/week: 0.0 oz  . Drug use: No  . Sexual activity: Yes  Lifestyle  . Physical activity:    Days per week: Not on file    Minutes per session: Not on file  . Stress: Not on file  Relationships  . Social connections:    Talks on phone: Not on file    Gets together: Not on file    Attends religious service: Not on file    Active member of club or organization: Not on file    Attends meetings of clubs or organizations: Not on file    Relationship status: Not on file  . Intimate partner violence:    Fear of current or ex partner: Not on file    Emotionally abused: Not on file    Physically abused: Not on file    Forced sexual activity: Not on file  Other Topics Concern  . Not on file  Social History Narrative   Social History:   Married    Tobacco Use - No.    Alcohol Use - yes         Family History:   Mother died at age 39 of coronary disease.  Father died at 82 of coronary disease.  He has 13 siblings, almost all of whom have coronary disease and some with premature onset.    Current Outpatient Medications  Medication Sig Dispense Refill  . acetaminophen (TYLENOL) 500 MG tablet Take 1,000 mg by mouth every 6  (six) hours as needed for moderate pain or headache.     [name] acyclovir (ZOVIRAX) 800 MG tablet Take 400 mg by mouth 2 (two) times daily.     [name] albuterol (PROVENTIL,VENTOLIN) 90 MCG/ACT inhaler Inhale 2 puffs into the lungs every 4 (four) hours as needed for shortness of breath.     [name] atorvastatin (LIPITOR) 80 MG tablet Take 1 tablet (80 mg total) by mouth at bedtime. 90 tablet 3  . brimonidine (ALPHAGAN) 0.2 % ophthalmic solution Place 1 drop into both eyes 2 (two) times daily.    . Cholecalciferol (VITAMIN D) 2000 UNITS CAPS Take 2,000 Units by mouth daily.    . dabigatran (PRADAXA) 150 MG CAPS Take 150 mg by mouth 2 (two) times daily.     . ferrous sulfate 325 (65 FE) MG tablet Take 325 mg by mouth daily with breakfast.     . furosemide (LASIX) 20 MG tablet Take 2 tablets (40 mg total) by mouth daily. 30 tablet 5  . ipratropium-albuterol (DUONEB) 0.5-2.5 (3) MG/3ML SOLN Take 3 mLs by nebulization daily.     . lansoprazole (PREVACID) 30 MG capsule Take 30 mg by mouth daily.      . metFORMIN (GLUCOPHAGE) 500 MG tablet Take 500 mg by mouth daily with breakfast.    . metoprolol (LOPRESSOR) 50 MG tablet Take 1 tablet (50 mg total) by mouth 2 (two) times daily. 180 tablet 4  . Multiple Vitamin (MULTIVITAMIN WITH MINERALS) TABS Take 1 tablet by mouth daily.    . multivitamin-lutein (OCUVITE-LUTEIN) CAPS Take 1 capsule by mouth daily.    . predniSONE (DELTASONE) 20 MG tablet Take 20-60 mg by mouth daily as needed (for gout attack).     [name]  terazosin (HYTRIN) 5 MG capsule Take 5 mg by mouth at bedtime.     . trolamine salicylate (ASPERCREME) 10 % cream Apply 1 application topically 4 (four) times daily as needed for muscle pain.      No current facility-administered medications for this visit.     Allergies  Allergen Reactions  . Bactrim [Sulfamethoxazole-Trimethoprim] Other (See Comments)    "does not do anything for me"  . Levaquin [Levofloxacin In D5w] Diarrhea      Review of  Systems:   General:  normal appetite, decreased energy, no weight gain, no weight loss, no fever  Cardiac:  no chest pain with exertion, no chest pain at rest, + SOB with mild exertion, no resting SOB, no PND, no orthopnea, + palpitations, + arrhythmia, + atrial fibrillation, + LE edema, no dizzy spells, no syncope  Respiratory:  exertional shortness of breath, + home oxygen, + productive cough, no dry cough, no bronchitis, no wheezing, no hemoptysis, no asthma, no pain with inspiration or cough, + sleep apnea, no CPAP at night  GI:   no difficulty swallowing, no reflux, no frequent heartburn, no hiatal hernia, no abdominal pain, no constipation, no diarrhea, no hematochezia, no hematemesis, no melena  GU:   no dysuria,  + frequency, no urinary tract infection, no hematuria, no enlarged prostate, no kidney stones, no kidney disease  Vascular:  no pain suggestive of claudication, no pain in feet, no leg cramps, no varicose veins, no DVT, no non-healing foot ulcer  Neuro:   no stroke, no TIA's, no seizures, no headaches, no temporary blindness one eye,  no slurred speech, no peripheral neuropathy, no chronic pain, + instability of gait, no memory/cognitive dysfunction  Musculoskeletal: + arthritis, no joint swelling, no myalgias, + difficulty walking, reduced mobility   Skin:   no rash, no itching, no skin infections, no pressure sores or ulcerations  Psych:   no anxiety, no depression, no nervousness, no unusual recent stress  Eyes:   no blurry vision, + floaters, + recent vision changes, + wears glasses or contacts  ENT:   no hearing loss, no loose or painful teeth, wears dentures, last saw dentist Jan 2018  Hematologic:  + easy bruising, no abnormal bleeding, no clotting disorder, no frequent epistaxis  Endocrine:  + diabetes, does  check CBG's at home       Physical Exam:   BP 118/61   Pulse 68   Resp 20   Ht 5\' 11"  (1.803 m)   Wt 222 lb (100.7 kg)   SpO2 95% Comment: RA  BMI 30.96  kg/m   General:  Elderly but  well-appearing, in no distress.  HEENT:  Unremarkable, NCAT, PERLA, EOMI, oropharynx clear  Neck:   no JVD, no bruits, no adenopathy or thyromegaly  Chest:   clear to auscultation, symmetrical breath sounds, no wheezes, no rhonchi   CV:   RRR, grade III/VI crescendo/decrescendo murmur heard best at RSB,  no diastolic murmur  Abdomen:  soft, non-tender, no masses or organomegaly  Extremities:  warm, well-perfused, pulses not palpable in feet, mild LE edema  Rectal/GU  Deferred  Neuro:   Grossly non-focal and symmetrical throughout  Skin:   Clean and dry, no rashes, no breakdown   Diagnostic Tests:  Result status: Edited Result - FINAL                           *[facility]*  [address] [place], [place] [address]                            [phone]  ------------------------------------------------------------------- Transthoracic Echocardiography  (Report amended )  Patient:    Sanjith, Siwek MR #:       [id number] Study Date: 07/29/2017 Gender:     M Age:        89 Height:     182.9 cm Weight:     100.3 kg BSA:        2.28 m^2 Pt. Status: Room:   ATTENDING    Kirk Ruths  [facility]  REFERRING    Kirk Ruths  SONOGRAPHER  Marygrace Drought, RCS  PERFORMING   [facility], Outpatient  cc:  ------------------------------------------------------------------- LV EF: 55% -   60%  ------------------------------------------------------------------- Indications:      Aortic Valve Disorder (I35.0).  ------------------------------------------------------------------- History:   PMH:  PVD.  Atrial fibrillation.  Congestive heart failure.  Chronic obstructive pulmonary disease.  PMH:   Myocardial infarction.  ------------------------------------------------------------------- Study Conclusions  - Left ventricle: The cavity size was severely dilated. Wall    thickness was increased in a pattern of moderate LVH. Systolic   function was normal. The estimated ejection fraction was in the   range of 55% to 60%. - Aortic valve: Moderately calcified annulus. Severely thickened,   severely calcified leaflets. There was severe stenosis. There was   mild regurgitation. Valve area (VTI): 0.56 cm^2. Valve area   (Vmax): 0.63 cm^2. Valve area (Vmean): 0.57 cm^2. - Mitral valve: Mildly to moderately calcified annulus. Mildly   thickened, severely calcified leaflets . The findings are   consistent with moderate stenosis. Valve area by pressure   half-time: 1.69 cm^2. Valve area by continuity equation (using   LVOT flow): 1 cm^2. - Left atrium: The atrium was moderately dilated. - Right ventricle: Systolic function was mildly reduced. - Right atrium: The atrium was mildly dilated. - Tricuspid valve: There was mild-moderate regurgitation.  ------------------------------------------------------------------- Labs, prior tests, procedures, and surgery: Coronary artery bypass grafting.  ------------------------------------------------------------------- Study data:  Comparison was made to the study of 09/11/2016.  Study status:  Routine.  Procedure:  The patient reported no pain pre or post test. Transthoracic echocardiography. Image quality was adequate.          Transthoracic echocardiography.  M-mode, complete 2D, spectral Doppler, and color Doppler.  Birthdate: Patient birthdate: 02-12-1932.  Age:  Patient is 84 yr old.  Sex: Gender: male.    BMI: 30 kg/m^2.  Blood pressure:     130/60 Patient status:  Outpatient.  Study date:  Study date: 07/29/2017. Study time: 02:10 PM.  Location:  [facility]  -------------------------------------------------------------------  ------------------------------------------------------------------- Left ventricle:  The cavity size was severely dilated. Wall thickness was increased in a pattern of moderate  LVH. Systolic function was normal. The estimated ejection fraction was in the range of 55% to 60%. The study was not technically sufficient to allow evaluation of LV diastolic dysfunction due to atrial fibrillation.  ------------------------------------------------------------------- Aortic valve:   Moderately calcified annulus. Severely thickened, severely calcified leaflets.  Doppler:   There was severe stenosis.   There was mild regurgitation.    VTI ratio of LVOT to aortic valve: 0.18. Valve area (VTI): 0.56 cm^2. Indexed valve  area (VTI): 0.25 cm^2/m^2. Peak velocity ratio of LVOT to aortic valve: 0.2. Valve area (Vmax): 0.63 cm^2. Indexed valve area (Vmax): 0.27 cm^2/m^2. Mean velocity ratio of LVOT to aortic valve: 0.18. Valve area (Vmean): 0.57 cm^2. Indexed valve area (Vmean): 0.25 cm^2/m^2.    Mean gradient (S): 51 mm Hg. Peak gradient (S): 85 mm Hg.  ------------------------------------------------------------------- Aorta:  Aortic root: The aortic root was normal in size. Ascending aorta: The ascending aorta was normal in size.  ------------------------------------------------------------------- Mitral valve:   Mildly to moderately calcified annulus. Mildly thickened, severely calcified leaflets .  Doppler:   The findings are consistent with moderate stenosis.   There was trivial regurgitation.    Valve area by pressure half-time: 1.69 cm^2. Indexed valve area by pressure half-time: 0.74 cm^2/m^2. Valve area by continuity equation (using LVOT flow): 1 cm^2. Indexed valve area by continuity equation (using LVOT flow): 0.44 cm^2/m^2. Mean gradient (D): 8 mm Hg. Peak gradient (D): 21 mm Hg.  ------------------------------------------------------------------- Left atrium:  The atrium was moderately dilated.  ------------------------------------------------------------------- Right ventricle:  The cavity size was normal. Systolic function was mildly  reduced.  ------------------------------------------------------------------- Pulmonic valve:    The valve appears to be grossly normal. Doppler:  There was no significant regurgitation.  ------------------------------------------------------------------- Tricuspid valve:   The valve appears to be grossly normal. Doppler:  There was mild-moderate regurgitation.  ------------------------------------------------------------------- Pulmonary artery:   Systolic pressure was within the normal range.   ------------------------------------------------------------------- Right atrium:  The atrium was mildly dilated.  ------------------------------------------------------------------- Pericardium:  There was no pericardial effusion.  ------------------------------------------------------------------- Systemic veins: Inferior vena cava: The vessel was dilated. The respirophasic diameter changes were blunted (< 50%), consistent with elevated central venous pressure. Diameter: 25 mm.  ------------------------------------------------------------------- Measurements   IVC                                      Value          Reference  ID                                       25    mm       ----------    Left ventricle                           Value          Reference  LV ID, ED, PLAX chordal          (H)     52.6  mm       43 - 52  LV ID, ES, PLAX chordal                  35.7  mm       23 - 38  LV fx shortening, PLAX chordal           32    %        >=29  LV PW thickness, ED                      15.1  mm       ----------  IVS/LV PW ratio, ED                      0.79           <=  1.3  Stroke volume, 2D                        67    ml       ----------  Stroke volume/bsa, 2D                    29    ml/m^2   ----------  LV e&', lateral                           9.25  cm/s     ----------  LV E/e&', lateral                         24.76          ----------  LV e&', medial                             7.61  cm/s     ----------  LV E/e&', medial                          30.09          ----------  LV e&', average                           8.43  cm/s     ----------  LV E/e&', average                         27.16          ----------    Ventricular septum                       Value          Reference  IVS thickness, ED                        12    mm       ----------    LVOT                                     Value          Reference  LVOT ID, S                               20    mm       ----------  LVOT area                                3.14  cm^2     ----------  LVOT peak velocity, S                    91.8  cm/s     ----------  LVOT mean velocity, S                    60.4  cm/s     ----------  LVOT VTI, S  21.2  cm       ----------    Aortic valve                             Value          Reference  Aortic valve peak velocity, S            461   cm/s     ----------  Aortic valve mean velocity, S            334   cm/s     ----------  Aortic valve VTI, S                      118   cm       ----------  Aortic mean gradient, S                  51    mm Hg    ----------  Aortic peak gradient, S                  85    mm Hg    ----------  VTI ratio, LVOT/AV                       0.18           ----------  Aortic valve area, VTI                   0.56  cm^2     ----------  Aortic valve area/bsa, VTI               0.25  cm^2/m^2 ----------  Velocity ratio, peak, LVOT/AV            0.2            ----------  Aortic valve area, peak velocity         0.63  cm^2     ----------  Aortic valve area/bsa, peak              0.27  cm^2/m^2 ----------  velocity  Velocity ratio, mean, LVOT/AV            0.18           ----------  Aortic valve area, mean velocity         0.57  cm^2     ----------  Aortic valve area/bsa, mean              0.25  cm^2/m^2 ----------  velocity  Aortic regurg pressure half-time         365   ms       ----------    Aorta                                     Value          Reference  Aortic root ID, ED                       37    mm       ----------    Left atrium                              Value  Reference  LA ID, A-P, ES                           49    mm       ----------  LA ID/bsa, A-P                           2.15  cm/m^2   <=2.2  LA volume, S                             98.5  ml       ----------  LA volume/bsa, S                         43.2  ml/m^2   ----------  LA volume, ES, 1-p A4C                   83.2  ml       ----------  LA volume/bsa, ES, 1-p A4C               36.5  ml/m^2   ----------  LA volume, ES, 1-p A2C                   98.8  ml       ----------  LA volume/bsa, ES, 1-p A2C               43.3  ml/m^2   ----------    Mitral valve                             Value          Reference  Mitral E-wave peak velocity              229   cm/s     ----------  Mitral mean velocity, D                  113   cm/s     ----------  Mitral deceleration time                 211   ms       150 - 230  Mitral pressure half-time                120   ms       ----------  Mitral mean gradient, D                  8     mm Hg    ----------  Mitral peak gradient, D                  21    mm Hg    ----------  Mitral valve area, PHT, DP               1.69  cm^2     ----------  Mitral valve area/bsa, PHT, DP           0.74  cm^2/m^2 ----------  Mitral valve area, LVOT                  1     cm^2     ----------  continuity  Mitral valve area/bsa, LVOT  0.44  cm^2/m^2 ----------  continuity  Mitral annulus VTI, D                    66.4  cm       ----------    Pulmonary arteries                       Value          Reference  PA pressure, S, DP                       20    mm Hg    <=30    Tricuspid valve                          Value          Reference  Tricuspid regurg peak velocity           176   cm/s     ----------  Tricuspid peak RV-RA gradient            12    mm Hg    ----------  Tricuspid  maximal regurg                 176   cm/s     ----------  velocity, PISA    Right atrium                             Value          Reference  RA ID, S-I, ES, A4C              (H)     59.4  mm       34 - 49  RA area, ES, A4C                 (H)     20.8  cm^2     8.3 - 19.5  RA volume, ES, A/L                       59.3  ml       ----------  RA volume/bsa, ES, A/L                   26    ml/m^2   ----------    Systemic veins                           Value          Reference  Estimated CVP                            8     mm Hg    ----------    Right ventricle                          Value          Reference  TAPSE                                    15.1  mm       ----------  RV pressure, S, DP  20    mm Hg    <=30  RV s&', lateral, S                        7.51  cm/s     ----------  Legend: (L)  and  (H)  mark values outside specified reference range.  ------------------------------------------------------------------- Jonne Ply, M.D. 2019-02-26T08:03:22    Nada Libman  CARDIAC CATHETERIZATION  Order# [id number]  Reading physician: Sherren Mocha, MD Ordering physician: Sherren Mocha, MD Study date: 08/14/17  Physicians   Panel Physicians Referring Physician Case Authorizing Physician  Sherren Mocha, MD (Primary)    Procedures   AORTIC ARCH ANGIOGRAPHY  RIGHT/LEFT HEART CATH AND CORONARY/GRAFT ANGIOGRAPHY  Conclusion     Prox RCA lesion is 100% stenosed.  LIMA graft was visualized by angiography and is normal in caliber.  The graft exhibits no disease.  Mid LM to Dist LM lesion is 30% stenosed.  Ost Cx to Prox Cx lesion is 100% stenosed.  Ost LAD to Prox LAD lesion is 80% stenosed.  Prox LAD lesion is 100% stenosed.  Seq SVG-.  Mid LAD to Dist LAD lesion is 70% stenosed.  Hemodynamic findings consistent with severe pulmonary hypertension.  There is severe aortic valve stenosis. There is trivial (1+) aortic  regurgitation.   1. Severe multivessel CAD with chronic total occlusion of the RCA, LCx, and LAD 2. S/p CABG with continued patency of the sequential SVG-OM1 and OM2 and LIMA-LAD 3. Moderate stenosis of the apical LAD at the site of previous angioplasty 4. Severe aortic stenosis with calculated AVA 0.89 square cm 5. Severe pulmonary HTN likely secondary to diastolic heart failure/left heart disease 6. Aortic root angiography demonstrates a valve deployment angle of LAO23, CRA12  Plan: continue multidisciplinary heart valve approach under TAVR evaluation, will increase furosemide to 40 mg daily, resume pradaxa tomorrow   Indications   Severe aortic stenosis [I35.0 (ICD-10-CM)]  Procedural Details/Technique   Technical Details INDICATION: Severe aortic stenosis  PROCEDURAL DETAILS: There was an indwelling IV in a right antecubital vein. Using normal sterile technique, the IV was changed out for a 5 Fr brachial sheath over a 0.018 inch wire. The left wrist was then prepped, draped, and anesthetized with 1% lidocaine. Using the modified Seldinger technique a 5/6 French Slender sheath was placed in the left radial artery. Intra-arterial verapamil was administered through the radial artery sheath. IV heparin was administered after a JR4 catheter was advanced into the central aorta. A Swan-Ganz catheter was used for the right heart catheterization. Standard protocol was followed for recording of right heart pressures and sampling of oxygen saturations. Fick cardiac output was calculated. Standard Judkins catheters were used for selective coronary angiography, bypass graft angiography, and aortic root angiography. There were no immediate procedural complications. The patient was transferred to the post catheterization recovery area for further monitoring.     Estimated blood loss <50 mL.  During this procedure the patient was administered the following to achieve and maintain moderate conscious  sedation: Versed 1 mg, Fentanyl 25 mcg, while the patient's heart rate, blood pressure, and oxygen saturation were continuously monitored. The period of conscious sedation was 26 minutes, of which I was present face-to-face 100% of this time.  Coronary Findings   Diagnostic  Dominance: Right  Left Main  Mid LM to Dist LM lesion 30% stenosed  Mid LM to Dist LM lesion is 30% stenosed. The lesion is moderately calcified. The left main is  diffusely diseased, calcified, without obstructive disease.  Left Anterior Descending  Ost LAD to Prox LAD lesion 80% stenosed  Ost LAD to Prox LAD lesion is 80% stenosed. The lesion is calcified.  Prox LAD lesion 100% stenosed  Prox LAD lesion is 100% stenosed. The lesion is calcified.  Mid LAD to Dist LAD lesion 70% stenosed  Mid LAD to Dist LAD lesion is 70% stenosed. distal lesion, site of previous angioplasty, moderate stenosis  Left Circumflex  Ost Cx to Prox Cx lesion 100% stenosed  Ost Cx to Prox Cx lesion is 100% stenosed. The lesion is moderately calcified. The circumflex is occluded at the ostium.  Right Coronary Artery  Prox RCA lesion 100% stenosed  Prox RCA lesion is 100% stenosed. chronic occlusion  Right Posterior Descending Artery  Collaterals  RPDA filled by collaterals from 1st Sept.    LIMA LIMA Graft to Mid LAD  LIMA graft was visualized by angiography and is normal in caliber. The graft exhibits no disease. The LIMA-LAD is widely patent. The proximal LAD and first diagonal fill retrograde from graft flow.  Sequential jump graft Graft to [employer], 2nd Mrg  Seq SVG-. Sequential SVG to OM1 and OM2 is patent  Intervention   No interventions have been documented.  Right Heart   Right Heart Pressures Hemodynamic findings consistent with severe pulmonary hypertension. Elevated LV EDP consistent with volume overload.  Left Heart   Aortic Valve There is severe aortic valve stenosis. There is trivial (1+) aortic regurgitation. The  aortic valve is calcified. There is restricted aortic valve motion. Mean gradient 38 mmHg, calculated AVA 0.89 square cm  Coronary Diagrams   Diagnostic Diagram       Implants    No implant documentation for this case.  MERGE Images   Show images for CARDIAC CATHETERIZATION   Link to Procedure Log   Procedure Log    Hemo Data    Most Recent Value  Fick Cardiac Output 4.38 L/min  Fick Cardiac Output Index 2.1 (L/min)/BSA  Aortic Mean Gradient 38 mmHg  Aortic Peak Gradient 34 mmHg  Aortic Valve Area 0.89  Aortic Value Area Index 0.43 cm2/BSA  RA A Wave -99 mmHg  RA V Wave 16 mmHg  RA Mean 14 mmHg  RV Systolic Pressure 69 mmHg  RV Diastolic Pressure 7 mmHg  RV EDP 14 mmHg  PA Systolic Pressure 69 mmHg  PA Diastolic Pressure 27 mmHg  PA Mean 47 mmHg  PW A Wave -99 mmHg  PW V Wave 34 mmHg  PW Mean 25 mmHg  AO Systolic Pressure 703 mmHg  AO Diastolic Pressure 61 mmHg  AO Mean 92 mmHg  LV Systolic Pressure 500 mmHg  LV Diastolic Pressure 13 mmHg  LV EDP 22 mmHg  Arterial Occlusion Pressure Extended Systolic Pressure 938 mmHg  Arterial Occlusion Pressure Extended Diastolic Pressure 62 mmHg  Arterial Occlusion Pressure Extended Mean Pressure 87 mmHg  Left Ventricular Apex Extended Systolic Pressure 182 mmHg  Left Ventricular Apex Extended Diastolic Pressure 16 mmHg  Left Ventricular Apex Extended EDP Pressure 26 mmHg  QP/QS 1  TPVR Index 22.4 HRUI  TSVR Index 43.38 HRUI  PVR SVR Ratio 0.29  TPVR/TSVR Ratio 0.52   CT CORONARY MORPH W/CTA COR W/SCORE W/CA W/CM &/OR WO/CM (Accession [id number]) (Order [id number])  Imaging  Date: 08/22/2017 Department: [facility] CT IMAGING Released By: Reggy Eye Authorizing: Sherren Mocha, MD  Exam Information   Status Exam Begun  Exam Ended   Final [  99] 08/22/2017 12:14 PM 08/22/2017 1:37 PM  PACS Images   Show images for CT CORONARY MORPH W/CTA COR W/SCORE W/CA W/CM &/OR WO/CM  Addendum    ADDENDUM REPORT: 08/23/2017 09:35  ADDENDUM: Please see separate dictation for contemporaneously obtained CTA chest, abdomen and pelvis 08/22/2017 for full description of relevant extracardiac findings.   Electronically Signed   By: Vinnie Langton M.D.   On: 08/23/2017 09:35   Addended by Etheleen Mayhew, MD on 08/23/2017 9:37 AM    Study Result   CLINICAL DATA:  Aortic Stenosis  EXAM: Cardiac TAVR CT  TECHNIQUE: The patient was scanned on a Siemens Force 329 slice scanner. A 120 kV retrospective scan was triggered in the ascending thoracic aorta at 140 HU's. Gantry rotation speed was 250 msecs and collimation was .6 mm. No beta blockade or nitro were given. The 3D data set was reconstructed in 5% intervals of the R-R cycle. Systolic and diastolic phases were analyzed on a dedicated work station using MPR, MIP and VRT modes. The patient received 80 cc of contrast.  FINDINGS: Aortic Valve: Tri-leaflet calcified with restricted motion  Mitral Valve: Thickened with restricted diastolic motion but not severe. Mild intervalvular fibrosa calcium. Calcium mostly at base of mitral leaflets  No LAA Thrombus  Aorta: Severe calcific atherosclerosis  Sino-tubular Junction: 29 mm  Ascending Thoracic Aorta: 33 mm  Aortic Arch: 28 mm  Descending Thoracic Aorta: 26 mm  Sinus of Valsalva Measurements:  Non-coronary: 35.4 mm  Right - coronary: 31.2 mm  Left -   coronary: 35.6 mm  Coronary Artery Height above Annulus:  Left Main: 13.3 mm above annulus  Right Coronary: 16.2 mm above annulus  There is a patent LIMA to LAD  There is a patent SVG to OM1/OM2  Virtual Basal Annulus Measurements:  Maximum / Minimum Diameter: 28.7 mm x 20.4 mm  Perimeter: 80.3 mm  Area: 490 mm2  Coronary Arteries: Sufficient height above annulus for deployment with patent LIMA to LAD and SVG to OM1/OM2  Optimum Fluoroscopic Angle for Delivery:  LAO 17 Caudal 10 degrees  IMPRESSION: 1. Calcified trileaflet AV with annulus 490 mm2 suitable for a 26 mm Sapien 3 valve  2. Coronary arteries sufficient height above annulus for deployment with patent LIMA to LAD and SVG to OM  3. Optimum angiographic angle for deployment LAO 17 degrees Caudal 10 degrees  4.  No LAA thrombus  5.  Severe calcific aortic atherosclerosis  Jenkins Rouge  Electronically Signed: By: Jenkins Rouge M.D. On: 08/22/2017 14:58       CT Angio Abd/Pel w/ and/or w/o (Accession [id number]) (Order [id number])  Imaging  Date: 08/22/2017 Department: [facility] CT IMAGING Released By: Beverley Fiedler Authorizing: Sherren Mocha, MD  Exam Information   Status Exam Begun  Exam Ended   Final [99] 08/22/2017 1:41 PM 08/22/2017 1:42 PM  PACS Images   Show images for CT Angio Abd/Pel w/ and/or w/o  Study Result   CLINICAL DATA:  84 year old male with history of severe aortic stenosis. Preprocedural study prior to potential transcatheter aortic valve replacement (TAVR) procedure.  EXAM: CT ANGIOGRAPHY CHEST, ABDOMEN AND PELVIS  TECHNIQUE: Multidetector CT imaging through the chest, abdomen and pelvis was performed using the standard protocol during bolus administration of intravenous contrast. Multiplanar reconstructed images and MIPs were obtained and reviewed to evaluate the vascular anatomy.  CONTRAST:  80 mL ISOVUE-370 IOPAMIDOL (ISOVUE-370) INJECTION 76%  COMPARISON:  CT the abdomen and pelvis 07/18/2013.  FINDINGS:  CTA CHEST FINDINGS  Cardiovascular: Heart size is enlarged with moderate left atrial dilatation. There is no significant pericardial fluid, thickening or pericardial calcification. There is aortic atherosclerosis, as well as atherosclerosis of the great vessels of the mediastinum and the coronary arteries, including calcified atherosclerotic plaque in the left main, left anterior descending, left  circumflex and right coronary arteries. Status post median sternotomy for CABG including LIMA to the LAD. Thickening and calcifications of the aortic valve. Ill-defined incomplete filling defect in the tip of the left atrial appendage which demonstrates different attenuation than seen on the accompanying cardiac CT, compatible with pseudo thrombus. No definite thrombus noted in the left atrium or left atrial appendage.  Mediastinum/Lymph Nodes: No pathologically enlarged mediastinal or hilar lymph nodes. Esophagus is unremarkable in appearance. No axillary lymphadenopathy.  Lungs/Pleura: Small bilateral pleural effusions lying dependently. No acute consolidative airspace disease. No pleural effusions. No suspicious appearing pulmonary nodules or masses.  Musculoskeletal/Soft Tissues: Chronic appearing superior endplate compression fracture of T12 with approximately 20% loss of central vertebral body height. Median sternotomy wires. There are no aggressive appearing lytic or blastic lesions noted in the visualized portions of the skeleton.  CTA ABDOMEN AND PELVIS FINDINGS  Hepatobiliary: No suspicious cystic or solid hepatic lesions. Lateral aspect of the right lobe of the liver is incompletely imaged. Small amount of high attenuation material lying dependently in the gallbladder may reflect biliary sludge and/or multiple tiny partially calcified gallstones. No findings to suggest an acute cholecystitis at this time.  Pancreas: No pancreatic mass. No pancreatic ductal dilatation. No pancreatic or peripancreatic fluid or inflammatory changes.  Spleen: Unremarkable.  Adrenals/Urinary Tract: Mild bilateral renal atrophy. Subcentimeter low-attenuation lesion in the lower pole the left kidney, too small to characterize, but favored to represent a tiny cyst. Right kidney and bilateral adrenal glands are otherwise normal in appearance. No hydroureteronephrosis. Urinary bladder  is normal in appearance.  Stomach/Bowel: Along the proximal aspect of the lesser curvature of the stomach there is a 16 x 9 x 15 mm focus of apparent enhancement (axial image 90 of series 13 and coronal image 72 of series 15), which was not evident on the preceding cardiac CTA obtained only 12 seconds earlier, which is in the midst of an area of apparent excavation of the mucosa, suspicious for potential ulcerated mass. No pathologic dilatation of small bowel or colon. The appendix is not confidently identified and may be surgically absent. Regardless, there are no inflammatory changes noted adjacent to the cecum to suggest the presence of an acute appendicitis at this time.  Vascular/Lymphatic: Vascular findings and measurements pertinent to potential TAVR procedure, as detailed below. No aneurysm or dissection of the abdominal or pelvic vasculature. Celiac axis, superior mesenteric artery and inferior mesenteric artery are all widely patent without hemodynamically significant stenosis. Single renal arteries bilaterally, with stent at the ostium of the left renal artery. Both renal arteries are grossly patent without hemodynamically significant stenosis. No lymphadenopathy noted in the abdomen or pelvis.  Reproductive: Prostate gland and seminal vesicles are unremarkable in appearance.  Other: No significant volume of ascites.  No pneumoperitoneum.  Musculoskeletal: There are no aggressive appearing lytic or blastic lesions noted in the visualized portions of the skeleton.  VASCULAR MEASUREMENTS PERTINENT TO TAVR:  AORTA:  Minimal Aortic Diameter-13 x 13 mm  Severity of Aortic Calcification -  severe  RIGHT PELVIS:  Right Common Iliac Artery -  Minimal Diameter-5.4 x 4.6 mm  Tortuosity-mild  Calcification-severe  Right External Iliac Artery -  Minimal Diameter-5.2 x 3.9 mm  Tortuosity-moderate  Calcification - severe  Right Common Femoral  Artery -  Minimal Diameter-4.5 x 1.6 mm  Tortuosity-mild  Calcification - severe  LEFT PELVIS:  Left Common Iliac Artery -  Minimal Diameter-6.3 x 3.3 mm  Tortuosity-mild  Calcification - severe  Left External Iliac Artery -  Minimal Diameter-7.4 x 5.0 mm  Tortuosity-moderate  Calcification - severe  Left Common Femoral Artery -  Minimal Diameter-4.1 x 3.3 mm  Tortuosity-mild  Calcification - severe  Review of the MIP images confirms the above findings.  IMPRESSION: 1. Vascular findings and measurements pertinent to potential TAVR procedure, as detailed above. 2. Thickening calcification of the aortic valve, compatible with the reported clinical history of severe aortic stenosis. 3. Possible ulcerated enhancing gastric mass along the proximal lesser curvature of the stomach, as discussed above. Further evaluation with nonemergent endoscopy is recommended in the near future to exclude the possibility of gastric neoplasm and/or ulcer. 4. Calcifications of the mitral valve and mitral annulus. 5. Aortic atherosclerosis, in addition to left main and 3 vessel coronary artery disease. Status post median sternotomy for CABG including LIMA to the LAD. 6. Small bilateral pleural effusions lying dependently. 7. Additional incidental findings, as above.  These results will be called to the ordering clinician or representative by the Radiologist Assistant, and communication documented in the PACS or zVision Dashboard.  Aortic Atherosclerosis (ICD10-I70.0).   Electronically Signed   By: Vinnie Langton M.D.   On: 08/23/2017 12:00      Impression:  This 84 year old gentleman has stage D, severe, symptomatic aortic stenosis and moderate mitral stenosis with New York Heart Association class III symptoms of exertional fatigue and shortness of breath consistent with chronic diastolic congestive heart failure.  I have personally reviewed his  echocardiogram, cardiac catheterization, and CTA studies.  His echocardiogram shows a trileaflet aortic valve with severely thickened and calcified leaflets with restricted mobility.  The mean transvalvular gradient is 51 mmHg consistent with severe aortic stenosis.  The mitral valve has thickened and severely calcified leaflets with a mean gradient of 8 mmHg and a peak gradient of 21 mmHg consistent with moderate mitral stenosis.  Left ventricular systolic function is preserved with an ejection fraction of 55-60% and moderate left ventricular hypertrophy.  Cardiac catheterization shows patent bypass grafts to the LAD and left circumflex territories.  There is moderate to severe pulmonary hypertension.  His operative risk for open surgical redo sternotomy with aortic valve replacement and mitral valve replacement would be prohibitive given his advanced age and comorbid conditions.  I think transcatheter aortic valve replacement would be the best option for him and hopefully will improve his symptoms despite having moderate mitral stenosis.  He is gated cardiac CT shows anatomy suitable for transcatheter aortic valve replacement with no complicating features.  His abdominal and pelvic CTA shows diffuse calcific atherosclerosis of the aortoiliac and femoral vessels and I do not think there is adequate access for transfemoral insertion.  The left axillary artery or left carotid artery appear suitable for access or trans-apical would be an option.  His carotid Dopplers in 03/2017 did show a 40-59% left internal carotid artery stenosis.  Repeat studies are pending.  His PFTs show severe obstructive disease as well as a severe diffusion capacity deficit which may increase his risk with a transapical insertion.  He also has a 16 x 9 x 15 mm focus of thickening and enhancement along the lesser curvature of the stomach which is suspicious  for a potential ulcerated mass.  The patient currently has no GI symptoms but does  have a history of a duodenal ulcer in the distant past which she thinks was in the 1990s that was treated with medication.  This area in the stomach could certainly be an ulcer or a malignancy.  He will require EGD to evaluate this further but given the absence of symptoms and the severity of his aortic stenosis I think it would be appropriate to proceed with transcatheter aortic valve replacement and work this up after he has recovered.  I reviewed the CT images of this with the patient and his wife and answered their questions and they are in agreement with having a GI workup done afterwards.  The patient and his wife were counseled at length regarding treatment alternatives for management of severe symptomatic aortic stenosis. The risks and benefits of surgical intervention has been discussed in detail. Long-term prognosis with medical therapy was discussed. Alternative approaches such as conventional surgical aortic valve replacement, transcatheter aortic valve replacement, and palliative medical therapy were compared and contrasted at length. This discussion was placed in the context of the patient's own specific clinical presentation and past medical history. All of their questions have been addressed.    A discussion was held regarding what types of management strategies would be attempted intraoperatively in the event of life-threatening complications, including whether or not the patient would be considered a candidate for the use of cardiopulmonary bypass and/or conversion to open sternotomy for attempted surgical intervention. The patient has been advised of a variety of complications that might develop including but not limited to risks of death, stroke, paravalvular leak, aortic dissection or other major vascular complications, aortic annulus rupture, device embolization, cardiac rupture or perforation, mitral regurgitation, acute myocardial infarction, arrhythmia, heart block or bradycardia  requiring permanent pacemaker placement, congestive heart failure, respiratory failure, renal failure, pneumonia, infection, other late complications related to structural valve deterioration or migration, or other complications that might ultimately cause a temporary or permanent loss of functional independence or other long term morbidity. The patient provides full informed consent for the procedure as described and all questions were answered.  The patient and his wife understand he will need to return for a second surgical evaluation before making a final decision about the appropriateness of proceeding with transcatheter aortic valve replacement.    Plan:  The patient will return for a second surgical evaluation by Dr. Roxy Manns on Monday, 09/09/2017.  If we decide to proceed with transcatheter aortic valve replacement it will tentatively be scheduled for Tuesday, 09/17/2017.  He will need to discontinue his metformin 48 hours preoperatively and will need to stop his Pradaxa 4 days preoperatively.  Since there is no thrombus in his left atrial appendage do not think he will require a Lovenox bridge.  I spent 80 minutes performing this consultation and > 50% of this time was spent face to face counseling and coordinating the care of this patient's severe symptomatic aortic stenosis and moderate mitral stenosis.    Gaye Pollack, MD 09/06/2017

## 2017-09-09 ENCOUNTER — Institutional Professional Consult (permissible substitution) (INDEPENDENT_AMBULATORY_CARE_PROVIDER_SITE_OTHER): Payer: Medicare Other | Admitting: Thoracic Surgery (Cardiothoracic Vascular Surgery)

## 2017-09-09 ENCOUNTER — Encounter: Payer: Self-pay | Admitting: Thoracic Surgery (Cardiothoracic Vascular Surgery)

## 2017-09-09 ENCOUNTER — Other Ambulatory Visit: Payer: Self-pay

## 2017-09-09 VITALS — BP 110/58 | HR 76 | Resp 18 | Ht 71.0 in | Wt 224.2 lb

## 2017-09-09 DIAGNOSIS — I2510 Atherosclerotic heart disease of native coronary artery without angina pectoris: Secondary | ICD-10-CM

## 2017-09-09 DIAGNOSIS — I2583 Coronary atherosclerosis due to lipid rich plaque: Secondary | ICD-10-CM | POA: Diagnosis not present

## 2017-09-09 DIAGNOSIS — I350 Nonrheumatic aortic (valve) stenosis: Secondary | ICD-10-CM | POA: Diagnosis not present

## 2017-09-09 DIAGNOSIS — I050 Rheumatic mitral stenosis: Secondary | ICD-10-CM | POA: Insufficient documentation

## 2017-09-09 DIAGNOSIS — I342 Nonrheumatic mitral (valve) stenosis: Secondary | ICD-10-CM

## 2017-09-09 NOTE — Patient Instructions (Signed)
Stop taking Pradaxa 5 days prior to surgery  Stop taking Metformin 2 days prior to surgery  Continue taking all other medications without change through the day before surgery.  Have nothing to eat or drink after midnight the night before surgery.  On the morning of surgery take only Prevacid with a sip of water.

## 2017-09-09 NOTE — Progress Notes (Signed)
[facility]  MULTIDISCIPLINARY HEART VALVE CLINIC  CARDIOTHORACIC SURGERY CONSULTATION REPORT  Referring Provider is Crenshaw, Denice Bors, MD PCP is Nicoletta Dress, MD  Chief Complaint  Patient presents with  . Aortic Stenosis    2nd TAVR consultation    HPI:  Patient is an 84 year old male with history of coronary artery disease status post coronary artery bypass grafting in the remote past, aortic stenosis, hypertension, permanent atrial fibrillation on long-term anticoagulation, cerebrovascular disease with previous right carotid endarterectomy, renovascular disease status post renal artery stenting, hyperlipidemia, COPD on home oxygen, type II diabetes, and degenerative arthritis who has been referred for a second surgical opinion to discuss treatment options for management of severe symptomatic aortic stenosis.  The patient's cardiac history dates back to 1994 at which time he underwent coronary artery bypass grafting x3 by Dr. Servando Snare.  He had left internal mammary artery graft placed to the left anterior descending coronary artery with sequential saphenous vein graft to the first and second obtuse marginal branches of left circumflex.  He developed atrial fibrillation which has remained persistent for which she is on long-term anticoagulation using Pradaxa.  In 2015 he was found to have severe stenosis of the distal left anterior descending coronary artery beyond the LIMA insertion for which he underwent PTCA.  He has known of the presence of a heart murmur for many years and he has been followed by Dr. Stanford Breed regularly.  Echocardiograms have documented gradual progression and severity of aortic stenosis.  Follow-up echocardiogram performed July 29, 2017 revealed severe aortic stenosis with peak velocity across the aortic valve measured 4.6 m/s corresponding to mean transvalvular gradient estimated 51 mmHg.  The DVI was 0.18 corresponding to aortic valve area  calculated 0.56 cm.  Left ventricular systolic function remain normal with ejection fraction estimated 55-60%.  There was also mild to moderate mitral stenosis with mean transvalvular gradient across the mitral valve calculated 8 mmHg and valve area calculated 1.69 cm by pressure half-time.  The patient was referred to Dr. Burt Knack and underwent diagnostic cardiac catheterization on August 14, 2017.  Catheterization revealed severe multivessel coronary artery disease with chronic total occlusion of the left anterior descending coronary artery, the right coronary artery, and the left circumflex coronary artery.  There was continued patency of the left internal mammary artery graft to the distal left anterior descending coronary artery.  There was also continued patency of the saphenous vein graft to the left circumflex system.  The right coronary artery filled via left-to-right collaterals.  There was moderate stenosis of the apical LAD at the site of previous angioplasty.  There was severe aortic stenosis and severe pulmonary hypertensioPeak to peak and mean transvalvular gradients across the aortic valve were measured 34 and 38 mmHg corresponding to aortic valve area calculated 0.89 cm.  The patient underwent CT angiography and was referred for surgical consultation.  He has been evaluated previously by Dr. Cyndia Bent and tentatively scheduled for transcatheter aortic valve replacement on September 17, 2017.  The patient is noted to have severe peripheral vascular disease and may not have adequate pelvic vasculature to facilitate a transfemoral approach for surgery.  Patient is married and lives locally in the country near [place] with his wife.  He is originally from [place] but spent 20 years in the [employer]  He retired from [employer].  He has remained reasonably active physically although he has been limited for several years because of chronic exertional shortness of breath as well as  chronic  degenerative arthritis.  He has chronic pain in his right hip and left knee.  He ambulates using a cane for stability secondary to poor balance.  He states that over the past 6 months to 1 year he has developed significant worsening of symptoms of exertional shortness of breath and fatigue.  He now gets short of breath with low-level activity.  He denies resting shortness of breath, PND, orthopnea, palpitations, or syncope.  He reports occasional dizzy spells and mild intermittent lower extremity edema.  He denies any history of exertional chest pain or chest tightness.  Past Medical History:  Diagnosis Date  . Acute myocardial infarction, unspecified site, initial episode of care   . Anemia   . Anxiety   . Aortic stenosis    . Atrial fibrillation ([place])    a. permanent, on Coumadin for anticoagulation  . CAD (coronary artery disease)    a. s/p CABG in 1994 b. cath in 2015 showing normal LM, 100% LCx and RCA stenosis with 50-60% prox LAD stenosis and 100% mid-LAD stenosis; patent sequential SVG-OM2-OM3 and patent LIMA-LAD with PTCA of distal LAD via LIMA graft performed at that time  . Carotid stenosis   . CHF (congestive heart failure) ([place])   . COPD (chronic obstructive pulmonary disease) ([place])   . Depression   . Diabetes mellitus without complication ([place])    FASTING 98-120S  . GERD (gastroesophageal reflux disease)   . History of blood transfusion   . History of peptic ulcer disease   . Hyperlipidemia   . Hypertension   . Myocardial infarction ([place])    X2  . Peripheral vascular disease ([place])   . Pneumonia    HX OF  . Renal artery stenosis ([place])   . Sleep apnea    OXYGEN AT NIGHT 2L [place]    Past Surgical History:  Procedure Laterality Date  . AORTIC ARCH ANGIOGRAPHY N/A 08/14/2017   Procedure: AORTIC ARCH ANGIOGRAPHY;  Surgeon: Sherren Mocha, MD;  Location: [place] [facility];  Service: Cardiovascular;  Laterality: N/A;  . CAROTID ENDARTERECTOMY    . CORONARY ARTERY BYPASS  GRAFT  1994  . ENDARTERECTOMY Right 01/05/2013   Procedure: ENDARTERECTOMY CAROTID;  Surgeon: Rosetta Posner, MD;  Location: [place];  Service: Vascular;  Laterality: Right;  . KNEE SURGERY  08/2003   left knee/ARTHROSCOPIC  . LEFT HEART CATHETERIZATION WITH CORONARY/GRAFT ANGIOGRAM N/A 09/01/2013   Procedure: LEFT HEART CATHETERIZATION WITH Beatrix Fetters;  Surgeon: Sinclair Grooms, MD;  Location: [facility] CATH LAB;  Service: Cardiovascular;  Laterality: N/A;  . PATCH ANGIOPLASTY Right 01/05/2013   Procedure: PATCH ANGIOPLASTY;  Surgeon: Rosetta Posner, MD;  Location: [place];  Service: Vascular;  Laterality: Right;  . PERCUTANEOUS CORONARY STENT INTERVENTION (PCI-S)  09/01/2013   Procedure: PERCUTANEOUS CORONARY STENT INTERVENTION (PCI-S);  Surgeon: Sinclair Grooms, MD;  Location: [facility] CATH LAB;  Service: Cardiovascular;;  . removal of bleeding ulcer  1994  . RENAL ARTERY STENT     stenting of the left renal artery as well as a cutting balloon angioplasty for treatment of in-stent restenosis coronary artery bypass grafting in 1994.  [name] RIGHT/LEFT HEART CATH AND CORONARY/GRAFT ANGIOGRAPHY N/A 08/14/2017   Procedure: RIGHT/LEFT HEART CATH AND CORONARY/GRAFT ANGIOGRAPHY;  Surgeon: Sherren Mocha, MD;  Location: [place] [facility];  Service: Cardiovascular;  Laterality: N/A;    Family History  Problem Relation Age of Onset  . Coronary artery disease Mother   . Heart disease Mother  Before age 36  . Hyperlipidemia Mother   . Hypertension Mother   . Heart attack Mother   . Coronary artery disease Father   . Heart disease Father        After age 62  . Heart attack Father   . Heart disease Sister        After age 75  . Heart disease Brother        After age 70  . Coronary artery disease Unknown        13 sibling, almost all have coronary disease and some with premature onset  . Heart disease Unknown        13 sibling, almost all have coronary disease and some with premature onset     Social History   Socioeconomic History  . Marital status: Married    Spouse name: Not on file  . Number of children: Not on file  . Years of education: Not on file  . Highest education level: Not on file  Occupational History  . Not on file  Social Needs  . Financial resource strain: Not on file  . Food insecurity:    Worry: Not on file    Inability: Not on file  . Transportation needs:    Medical: Not on file    Non-medical: Not on file  Tobacco Use  . Smoking status: Former Smoker    Last attempt to quit: 04/27/1993    Years since quitting: 24.3  . Smokeless tobacco: Never Used  Substance and Sexual Activity  . Alcohol use: No    Alcohol/week: 0.0 oz  . Drug use: No  . Sexual activity: Yes  Lifestyle  . Physical activity:    Days per week: Not on file    Minutes per session: Not on file  . Stress: Not on file  Relationships  . Social connections:    Talks on phone: Not on file    Gets together: Not on file    Attends religious service: Not on file    Active member of club or organization: Not on file    Attends meetings of clubs or organizations: Not on file    Relationship status: Not on file  . Intimate partner violence:    Fear of current or ex partner: Not on file    Emotionally abused: Not on file    Physically abused: Not on file    Forced sexual activity: Not on file  Other Topics Concern  . Not on file  Social History Narrative   Social History:   Married    Tobacco Use - No.    Alcohol Use - yes         Family History:   Mother died at age 52 of coronary disease.  Father died at 52 of coronary disease.  He has 13 siblings, almost all of whom have coronary disease and some with premature onset.    Current Outpatient Medications  Medication Sig Dispense Refill  . acetaminophen (TYLENOL) 500 MG tablet Take 1,000 mg by mouth every 6 (six) hours as needed for moderate pain or headache.     [name] acyclovir (ZOVIRAX) 800 MG tablet Take 400 mg by  mouth 2 (two) times daily.     [name] albuterol (PROVENTIL,VENTOLIN) 90 MCG/ACT inhaler Inhale 2 puffs into the lungs every 4 (four) hours as needed for shortness of breath.     [name] atorvastatin (LIPITOR) 80 MG tablet Take 1 tablet (80 mg total) by mouth at bedtime. [place]  tablet 3  . brimonidine (ALPHAGAN) 0.2 % ophthalmic solution Place 1 drop into both eyes 2 (two) times daily.    . Cholecalciferol (VITAMIN D) 2000 UNITS CAPS Take 2,000 Units by mouth daily.    . dabigatran (PRADAXA) 150 MG CAPS Take 150 mg by mouth 2 (two) times daily.     . ferrous sulfate 325 (65 FE) MG tablet Take 325 mg by mouth daily with breakfast.     . furosemide (LASIX) 20 MG tablet Take 2 tablets (40 mg total) by mouth daily. 30 tablet 5  . ipratropium-albuterol (DUONEB) 0.5-2.5 (3) MG/3ML SOLN Take 3 mLs by nebulization daily.     . lansoprazole (PREVACID) 30 MG capsule Take 30 mg by mouth daily.      . metFORMIN (GLUCOPHAGE) 500 MG tablet Take 500 mg by mouth daily with breakfast.    . metoprolol (LOPRESSOR) 50 MG tablet Take 1 tablet (50 mg total) by mouth 2 (two) times daily. 180 tablet 4  . Multiple Vitamin (MULTIVITAMIN WITH MINERALS) TABS Take 1 tablet by mouth daily.    . multivitamin-lutein (OCUVITE-LUTEIN) CAPS Take 1 capsule by mouth daily.    . predniSONE (DELTASONE) 20 MG tablet Take 20-60 mg by mouth daily as needed (for gout attack).     . terazosin (HYTRIN) 5 MG capsule Take 5 mg by mouth at bedtime.     . trolamine salicylate (ASPERCREME) 10 % cream Apply 1 application topically 4 (four) times daily as needed for muscle pain.      No current facility-administered medications for this visit.     Allergies  Allergen Reactions  . Bactrim [Sulfamethoxazole-Trimethoprim] Other (See Comments)    "does not do anything for me"  . Levaquin [Levofloxacin In D5w] Diarrhea      Review of Systems:   General:  normal appetite, decreased energy, no weight gain, no weight loss, no fever  Cardiac:  no chest pain  with exertion, no chest pain at rest, +SOB with exertion, no resting SOB, no PND, no orthopnea, no palpitations, + arrhythmia, + atrial fibrillation, + LE edema, no dizzy spells, no syncope  Respiratory:  + shortness of breath, no home oxygen, + productive cough, no dry cough, no bronchitis, no wheezing, no hemoptysis, no asthma, no pain with inspiration or cough, no sleep apnea, no CPAP at night  GI:   no difficulty swallowing, no reflux, no frequent heartburn, no hiatal hernia, no abdominal pain, no constipation, no diarrhea, no hematochezia, no hematemesis, no melena  GU:   no dysuria,  + frequency, no urinary tract infection, no hematuria, no enlarged prostate, no kidney stones, no kidney disease  Vascular:  no pain suggestive of claudication, no pain in feet, no leg cramps, no varicose veins, no DVT, no non-healing foot ulcer  Neuro:   no stroke, no TIA's, no seizures, no headaches, no temporary blindness one eye,  no slurred speech, no peripheral neuropathy, + chronic pain, = instability of gait, no memory/cognitive dysfunction  Musculoskeletal: + arthritis, no joint swelling, no myalgias, +  difficulty walking, diminished mobility   Skin:   no rash, no itching, no skin infections, no pressure sores or ulcerations  Psych:   no anxiety, no depression, no nervousness, no unusual recent stress  Eyes:   no blurry vision, + floaters, no recent vision changes, does not wears glasses or contacts  ENT:   no hearing loss, no loose or painful teeth, + dentures, last saw dentist Jan 2018  Hematologic:  + easy bruising,  no abnormal bleeding, no clotting disorder, no frequent epistaxis  Endocrine:  + diabetes, does check CBG's at home           Physical Exam:   BP (!) 110/58 (BP Location: Left Arm, Patient Position: Sitting, Cuff Size: Large)   Pulse 76   Resp 18   Ht 5\' 11"  (1.803 m)   Wt 224 lb 3.2 oz (101.7 kg)   SpO2 93% Comment: RA  BMI 31.27 kg/m    General:  mildy obese, elderly but  well-appearing  HEENT:  Unremarkable   Neck:   no JVD, no bruits, no adenopathy   Chest:   clear to auscultation, symmetrical breath sounds, no wheezes, no rhonchi   CV:   RRR, grade III/VI crescendo/decrescendo murmur heard best at RSB,  no diastolic murmur  Abdomen:  soft, non-tender, no masses   Extremities:  warm, well-perfused, pulses not palpable, no LE edema  Rectal/GU  Deferred  Neuro:   Grossly non-focal and symmetrical throughout  Skin:   Clean and dry, no rashes, no breakdown   Diagnostic Tests:  Transthoracic Echocardiography  (Report amended )  Patient:    Godwin, Tedesco MR #:       [id number] Study Date: 07/29/2017 Gender:     M Age:        35 Height:     182.9 cm Weight:     100.3 kg BSA:        2.28 m^2 Pt. Status: Room:   ATTENDING    Kirk Ruths  [facility] Crenshaw  REFERRING    Kirk Ruths  SONOGRAPHER  Marygrace Drought, RCS  PERFORMING   [facility], Outpatient  cc:  ------------------------------------------------------------------- LV EF: 55% -   60%  ------------------------------------------------------------------- Indications:      Aortic Valve Disorder (I35.0).  ------------------------------------------------------------------- History:   PMH:  PVD.  Atrial fibrillation.  Congestive heart failure.  Chronic obstructive pulmonary disease.  PMH:   Myocardial infarction.  ------------------------------------------------------------------- Study Conclusions  - Left ventricle: The cavity size was severely dilated. Wall   thickness was increased in a pattern of moderate LVH. Systolic   function was normal. The estimated ejection fraction was in the   range of 55% to 60%. - Aortic valve: Moderately calcified annulus. Severely thickened,   severely calcified leaflets. There was severe stenosis. There was   mild regurgitation. Valve area (VTI): 0.56 cm^2. Valve area   (Vmax): 0.63 cm^2. Valve area (Vmean): 0.57 cm^2. - Mitral  valve: Mildly to moderately calcified annulus. Mildly   thickened, severely calcified leaflets . The findings are   consistent with moderate stenosis. Valve area by pressure   half-time: 1.69 cm^2. Valve area by continuity equation (using   LVOT flow): 1 cm^2. - Left atrium: The atrium was moderately dilated. - Right ventricle: Systolic function was mildly reduced. - Right atrium: The atrium was mildly dilated. - Tricuspid valve: There was mild-moderate regurgitation.  ------------------------------------------------------------------- Labs, prior tests, procedures, and surgery: Coronary artery bypass grafting.  ------------------------------------------------------------------- Study data:  Comparison was made to the study of 09/11/2016.  Study status:  Routine.  Procedure:  The patient reported no pain pre or post test. Transthoracic echocardiography. Image quality was adequate.          Transthoracic echocardiography.  M-mode, complete 2D, spectral Doppler, and color Doppler.  Birthdate: Patient birthdate: 05-12-1932.  Age:  Patient is 84 yr old.  Sex: Gender: male.    BMI: 30 kg/m^2.  Blood pressure:  130/60 Patient status:  Outpatient.  Study date:  Study date: 07/29/2017. Study time: 02:10 PM.  Location:  [facility]  -------------------------------------------------------------------  ------------------------------------------------------------------- Left ventricle:  The cavity size was severely dilated. Wall thickness was increased in a pattern of moderate LVH. Systolic function was normal. The estimated ejection fraction was in the range of 55% to 60%. The study was not technically sufficient to allow evaluation of LV diastolic dysfunction due to atrial fibrillation.  ------------------------------------------------------------------- Aortic valve:   Moderately calcified annulus. Severely thickened, severely calcified leaflets.  Doppler:   There was severe  stenosis.   There was mild regurgitation.    VTI ratio of LVOT to aortic valve: 0.18. Valve area (VTI): 0.56 cm^2. Indexed valve area (VTI): 0.25 cm^2/m^2. Peak velocity ratio of LVOT to aortic valve: 0.2. Valve area (Vmax): 0.63 cm^2. Indexed valve area (Vmax): 0.27 cm^2/m^2. Mean velocity ratio of LVOT to aortic valve: 0.18. Valve area (Vmean): 0.57 cm^2. Indexed valve area (Vmean): 0.25 cm^2/m^2.    Mean gradient (S): 51 mm Hg. Peak gradient (S): 85 mm Hg.  ------------------------------------------------------------------- Aorta:  Aortic root: The aortic root was normal in size. Ascending aorta: The ascending aorta was normal in size.  ------------------------------------------------------------------- Mitral valve:   Mildly to moderately calcified annulus. Mildly thickened, severely calcified leaflets .  Doppler:   The findings are consistent with moderate stenosis.   There was trivial regurgitation.    Valve area by pressure half-time: 1.69 cm^2. Indexed valve area by pressure half-time: 0.74 cm^2/m^2. Valve area by continuity equation (using LVOT flow): 1 cm^2. Indexed valve area by continuity equation (using LVOT flow): 0.44 cm^2/m^2. Mean gradient (D): 8 mm Hg. Peak gradient (D): 21 mm Hg.  ------------------------------------------------------------------- Left atrium:  The atrium was moderately dilated.  ------------------------------------------------------------------- Right ventricle:  The cavity size was normal. Systolic function was mildly reduced.  ------------------------------------------------------------------- Pulmonic valve:    The valve appears to be grossly normal. Doppler:  There was no significant regurgitation.  ------------------------------------------------------------------- Tricuspid valve:   The valve appears to be grossly normal. Doppler:  There was mild-moderate  regurgitation.  ------------------------------------------------------------------- Pulmonary artery:   Systolic pressure was within the normal range.   ------------------------------------------------------------------- Right atrium:  The atrium was mildly dilated.  ------------------------------------------------------------------- Pericardium:  There was no pericardial effusion.  ------------------------------------------------------------------- Systemic veins: Inferior vena cava: The vessel was dilated. The respirophasic diameter changes were blunted (< 50%), consistent with elevated central venous pressure. Diameter: 25 mm.  ------------------------------------------------------------------- Measurements   IVC                                      Value          Reference  ID                                       25    mm       ----------    Left ventricle                           Value          Reference  LV ID, ED, PLAX chordal          (H)     52.6  mm       43 - 52  LV ID, ES, PLAX chordal                  35.7  mm       23 - 38  LV fx shortening, PLAX chordal           32    %        >=29  LV PW thickness, ED                      15.1  mm       ----------  IVS/LV PW ratio, ED                      0.79           <=1.3  Stroke volume, 2D                        67    ml       ----------  Stroke volume/bsa, 2D                    29    ml/m^2   ----------  LV e&', lateral                           9.25  cm/s     ----------  LV E/e&', lateral                         24.76          ----------  LV e&', medial                            7.61  cm/s     ----------  LV E/e&', medial                          30.09          ----------  LV e&', average                           8.43  cm/s     ----------  LV E/e&', average                         27.16          ----------    Ventricular septum                       Value          Reference  IVS thickness, ED                         12    mm       ----------    LVOT                                     Value          Reference  LVOT ID, S  20    mm       ----------  LVOT area                                3.14  cm^2     ----------  LVOT peak velocity, S                    91.8  cm/s     ----------  LVOT mean velocity, S                    60.4  cm/s     ----------  LVOT VTI, S                              21.2  cm       ----------    Aortic valve                             Value          Reference  Aortic valve peak velocity, S            461   cm/s     ----------  Aortic valve mean velocity, S            334   cm/s     ----------  Aortic valve VTI, S                      118   cm       ----------  Aortic mean gradient, S                  51    mm Hg    ----------  Aortic peak gradient, S                  85    mm Hg    ----------  VTI ratio, LVOT/AV                       0.18           ----------  Aortic valve area, VTI                   0.56  cm^2     ----------  Aortic valve area/bsa, VTI               0.25  cm^2/m^2 ----------  Velocity ratio, peak, LVOT/AV            0.2            ----------  Aortic valve area, peak velocity         0.63  cm^2     ----------  Aortic valve area/bsa, peak              0.27  cm^2/m^2 ----------  velocity  Velocity ratio, mean, LVOT/AV            0.18           ----------  Aortic valve area, mean velocity         0.57  cm^2     ----------  Aortic valve area/bsa, mean              0.25  cm^2/m^2 ----------  velocity  Aortic regurg pressure half-time         365   ms       ----------    Aorta                                    Value          Reference  Aortic root ID, ED                       37    mm       ----------    Left atrium                              Value          Reference  LA ID, A-P, ES                           49    mm       ----------  LA ID/bsa, A-P                           2.15  cm/m^2   <=2.2  LA volume, S                              98.5  ml       ----------  LA volume/bsa, S                         43.2  ml/m^2   ----------  LA volume, ES, 1-p A4C                   83.2  ml       ----------  LA volume/bsa, ES, 1-p A4C               36.5  ml/m^2   ----------  LA volume, ES, 1-p A2C                   98.8  ml       ----------  LA volume/bsa, ES, 1-p A2C               43.3  ml/m^2   ----------    Mitral valve                             Value          Reference  Mitral E-wave peak velocity              229   cm/s     ----------  Mitral mean velocity, D                  113   cm/s     ----------  Mitral deceleration time                 211   ms       150 - 230  Mitral pressure half-time                120   ms       ----------  Mitral  mean gradient, D                  8     mm Hg    ----------  Mitral peak gradient, D                  21    mm Hg    ----------  Mitral valve area, PHT, DP               1.69  cm^2     ----------  Mitral valve area/bsa, PHT, DP           0.74  cm^2/m^2 ----------  Mitral valve area, LVOT                  1     cm^2     ----------  continuity  Mitral valve area/bsa, LVOT              0.44  cm^2/m^2 ----------  continuity  Mitral annulus VTI, D                    66.4  cm       ----------    Pulmonary arteries                       Value          Reference  PA pressure, S, DP                       20    mm Hg    <=30    Tricuspid valve                          Value          Reference  Tricuspid regurg peak velocity           176   cm/s     ----------  Tricuspid peak RV-RA gradient            12    mm Hg    ----------  Tricuspid maximal regurg                 176   cm/s     ----------  velocity, PISA    Right atrium                             Value          Reference  RA ID, S-I, ES, A4C              (H)     59.4  mm       34 - 49  RA area, ES, A4C                 (H)     20.8  cm^2     8.3 - 19.5  RA volume, ES, A/L                       59.3  ml       ----------  RA  volume/bsa, ES, A/L                   26    ml/m^2   ----------    Systemic veins  Value          Reference  Estimated CVP                            8     mm Hg    ----------    Right ventricle                          Value          Reference  TAPSE                                    15.1  mm       ----------  RV pressure, S, DP                       20    mm Hg    <=30  RV s&', lateral, S                        7.51  cm/s     ----------  Legend: (L)  and  (H)  mark values outside specified reference range.  ------------------------------------------------------------------- Jonne Ply, M.D. 2019-02-26T08:03:22   AORTIC ARCH ANGIOGRAPHY  RIGHT/LEFT HEART CATH AND CORONARY/GRAFT ANGIOGRAPHY  Conclusion     Prox RCA lesion is 100% stenosed.  LIMA graft was visualized by angiography and is normal in caliber.  The graft exhibits no disease.  Mid LM to Dist LM lesion is 30% stenosed.  Ost Cx to Prox Cx lesion is 100% stenosed.  Ost LAD to Prox LAD lesion is 80% stenosed.  Prox LAD lesion is 100% stenosed.  Seq SVG-.  Mid LAD to Dist LAD lesion is 70% stenosed.  Hemodynamic findings consistent with severe pulmonary hypertension.  There is severe aortic valve stenosis. There is trivial (1+) aortic regurgitation.   1. Severe multivessel CAD with chronic total occlusion of the RCA, LCx, and LAD 2. S/p CABG with continued patency of the sequential SVG-OM1 and OM2 and LIMA-LAD 3. Moderate stenosis of the apical LAD at the site of previous angioplasty 4. Severe aortic stenosis with calculated AVA 0.89 square cm 5. Severe pulmonary HTN likely secondary to diastolic heart failure/left heart disease 6. Aortic root angiography demonstrates a valve deployment angle of LAO23, CRA12  Plan: continue multidisciplinary heart valve approach under TAVR evaluation, will increase furosemide to 40 mg daily, resume pradaxa tomorrow    Indications   Severe aortic stenosis [I35.0 (ICD-10-CM)]  Procedural Details/Technique   Technical Details INDICATION: Severe aortic stenosis  PROCEDURAL DETAILS: There was an indwelling IV in a right antecubital vein. Using normal sterile technique, the IV was changed out for a 5 Fr brachial sheath over a 0.018 inch wire. The left wrist was then prepped, draped, and anesthetized with 1% lidocaine. Using the modified Seldinger technique a 5/6 French Slender sheath was placed in the left radial artery. Intra-arterial verapamil was administered through the radial artery sheath. IV heparin was administered after a JR4 catheter was advanced into the central aorta. A Swan-Ganz catheter was used for the right heart catheterization. Standard protocol was followed for recording of right heart pressures and sampling of oxygen saturations. Fick cardiac output was calculated. Standard Judkins catheters were used for selective coronary angiography, bypass graft angiography, and aortic root angiography. There were  no immediate procedural complications. The patient was transferred to the post catheterization recovery area for further monitoring.     Estimated blood loss <50 mL.  During this procedure the patient was administered the following to achieve and maintain moderate conscious sedation: Versed 1 mg, Fentanyl 25 mcg, while the patient's heart rate, blood pressure, and oxygen saturation were continuously monitored. The period of conscious sedation was 26 minutes, of which I was present face-to-face 100% of this time.  Coronary Findings   Diagnostic  Dominance: Right  Left Main  Mid LM to Dist LM lesion 30% stenosed  Mid LM to Dist LM lesion is 30% stenosed. The lesion is moderately calcified. The left main is diffusely diseased, calcified, without obstructive disease.  Left Anterior Descending  Ost LAD to Prox LAD lesion 80% stenosed  Ost LAD to Prox LAD lesion is 80% stenosed. The lesion is  calcified.  Prox LAD lesion 100% stenosed  Prox LAD lesion is 100% stenosed. The lesion is calcified.  Mid LAD to Dist LAD lesion 70% stenosed  Mid LAD to Dist LAD lesion is 70% stenosed. distal lesion, site of previous angioplasty, moderate stenosis  Left Circumflex  Ost Cx to Prox Cx lesion 100% stenosed  Ost Cx to Prox Cx lesion is 100% stenosed. The lesion is moderately calcified. The circumflex is occluded at the ostium.  Right Coronary Artery  Prox RCA lesion 100% stenosed  Prox RCA lesion is 100% stenosed. chronic occlusion  Right Posterior Descending Artery  Collaterals  RPDA filled by collaterals from 1st Sept.    LIMA LIMA Graft to Mid LAD  LIMA graft was visualized by angiography and is normal in caliber. The graft exhibits no disease. The LIMA-LAD is widely patent. The proximal LAD and first diagonal fill retrograde from graft flow.  Sequential jump graft Graft to [employer], 2nd Mrg  Seq SVG-. Sequential SVG to OM1 and OM2 is patent  Intervention   No interventions have been documented.  Right Heart   Right Heart Pressures Hemodynamic findings consistent with severe pulmonary hypertension. Elevated LV EDP consistent with volume overload.  Left Heart   Aortic Valve There is severe aortic valve stenosis. There is trivial (1+) aortic regurgitation. The aortic valve is calcified. There is restricted aortic valve motion. Mean gradient 38 mmHg, calculated AVA 0.89 square cm  Coronary Diagrams   Diagnostic Diagram       Implants    No implant documentation for this case.  MERGE Images   Show images for CARDIAC CATHETERIZATION   Link to Procedure Log   Procedure Log    Hemo Data    Most Recent Value  Fick Cardiac Output 4.38 L/min  Fick Cardiac Output Index 2.1 (L/min)/BSA  Aortic Mean Gradient 38 mmHg  Aortic Peak Gradient 34 mmHg  Aortic Valve Area 0.89  Aortic Value Area Index 0.43 cm2/BSA  RA A Wave -99 mmHg  RA V Wave 16 mmHg  RA Mean 14 mmHg  RV  Systolic Pressure 69 mmHg  RV Diastolic Pressure 7 mmHg  RV EDP 14 mmHg  PA Systolic Pressure 69 mmHg  PA Diastolic Pressure 27 mmHg  PA Mean 47 mmHg  PW A Wave -99 mmHg  PW V Wave 34 mmHg  PW Mean 25 mmHg  AO Systolic Pressure 850 mmHg  AO Diastolic Pressure 61 mmHg  AO Mean 92 mmHg  LV Systolic Pressure 277 mmHg  LV Diastolic Pressure 13 mmHg  LV EDP 22 mmHg  Arterial Occlusion Pressure Extended Systolic  Pressure 127 mmHg  Arterial Occlusion Pressure Extended Diastolic Pressure 62 mmHg  Arterial Occlusion Pressure Extended Mean Pressure 87 mmHg  Left Ventricular Apex Extended Systolic Pressure 664 mmHg  Left Ventricular Apex Extended Diastolic Pressure 16 mmHg  Left Ventricular Apex Extended EDP Pressure 26 mmHg  QP/QS 1  TPVR Index 22.4 HRUI  TSVR Index 43.38 HRUI  PVR SVR Ratio 0.29  TPVR/TSVR Ratio 0.52    Cardiac TAVR CT  TECHNIQUE: The patient was scanned on a Siemens Force 403 slice scanner. A 120 kV retrospective scan was triggered in the ascending thoracic aorta at 140 HU's. Gantry rotation speed was 250 msecs and collimation was .6 mm. No beta blockade or nitro were given. The 3D data set was reconstructed in 5% intervals of the R-R cycle. Systolic and diastolic phases were analyzed on a dedicated work station using MPR, MIP and VRT modes. The patient received 80 cc of contrast.  FINDINGS: Aortic Valve: Tri-leaflet calcified with restricted motion  Mitral Valve: Thickened with restricted diastolic motion but not severe. Mild intervalvular fibrosa calcium. Calcium mostly at base of mitral leaflets  No LAA Thrombus  Aorta: Severe calcific atherosclerosis  Sino-tubular Junction: 29 mm  Ascending Thoracic Aorta: 33 mm  Aortic Arch: 28 mm  Descending Thoracic Aorta: 26 mm  Sinus of Valsalva Measurements:  Non-coronary: 35.4 mm  Right - coronary: 31.2 mm  Left -   coronary: 35.6 mm  Coronary Artery Height above Annulus:  Left  Main: 13.3 mm above annulus  Right Coronary: 16.2 mm above annulus  There is a patent LIMA to LAD  There is a patent SVG to OM1/OM2  Virtual Basal Annulus Measurements:  Maximum / Minimum Diameter: 28.7 mm x 20.4 mm  Perimeter: 80.3 mm  Area: 490 mm2  Coronary Arteries: Sufficient height above annulus for deployment with patent LIMA to LAD and SVG to OM1/OM2  Optimum Fluoroscopic Angle for Delivery: LAO 17 Caudal 10 degrees  IMPRESSION: 1. Calcified trileaflet AV with annulus 490 mm2 suitable for a 26 mm Sapien 3 valve  2. Coronary arteries sufficient height above annulus for deployment with patent LIMA to LAD and SVG to OM  3. Optimum angiographic angle for deployment LAO 17 degrees Caudal 10 degrees  4.  No LAA thrombus  5.  Severe calcific aortic atherosclerosis  Jenkins Rouge  Electronically Signed: By: Jenkins Rouge M.D. On: 08/22/2017 14:58   CT ANGIOGRAPHY CHEST, ABDOMEN AND PELVIS  TECHNIQUE: Multidetector CT imaging through the chest, abdomen and pelvis was performed using the standard protocol during bolus administration of intravenous contrast. Multiplanar reconstructed images and MIPs were obtained and reviewed to evaluate the vascular anatomy.  CONTRAST:  80 mL ISOVUE-370 IOPAMIDOL (ISOVUE-370) INJECTION 76%  COMPARISON:  CT the abdomen and pelvis 07/18/2013.  FINDINGS: CTA CHEST FINDINGS  Cardiovascular: Heart size is enlarged with moderate left atrial dilatation. There is no significant pericardial fluid, thickening or pericardial calcification. There is aortic atherosclerosis, as well as atherosclerosis of the great vessels of the mediastinum and the coronary arteries, including calcified atherosclerotic plaque in the left main, left anterior descending, left circumflex and right coronary arteries. Status post median sternotomy for CABG including LIMA to the LAD. Thickening and calcifications of the aortic  valve. Ill-defined incomplete filling defect in the tip of the left atrial appendage which demonstrates different attenuation than seen on the accompanying cardiac CT, compatible with pseudo thrombus. No definite thrombus noted in the left atrium or left atrial appendage.  Mediastinum/Lymph Nodes: No pathologically enlarged  mediastinal or hilar lymph nodes. Esophagus is unremarkable in appearance. No axillary lymphadenopathy.  Lungs/Pleura: Small bilateral pleural effusions lying dependently. No acute consolidative airspace disease. No pleural effusions. No suspicious appearing pulmonary nodules or masses.  Musculoskeletal/Soft Tissues: Chronic appearing superior endplate compression fracture of T12 with approximately 20% loss of central vertebral body height. Median sternotomy wires. There are no aggressive appearing lytic or blastic lesions noted in the visualized portions of the skeleton.  CTA ABDOMEN AND PELVIS FINDINGS  Hepatobiliary: No suspicious cystic or solid hepatic lesions. Lateral aspect of the right lobe of the liver is incompletely imaged. Small amount of high attenuation material lying dependently in the gallbladder may reflect biliary sludge and/or multiple tiny partially calcified gallstones. No findings to suggest an acute cholecystitis at this time.  Pancreas: No pancreatic mass. No pancreatic ductal dilatation. No pancreatic or peripancreatic fluid or inflammatory changes.  Spleen: Unremarkable.  Adrenals/Urinary Tract: Mild bilateral renal atrophy. Subcentimeter low-attenuation lesion in the lower pole the left kidney, too small to characterize, but favored to represent a tiny cyst. Right kidney and bilateral adrenal glands are otherwise normal in appearance. No hydroureteronephrosis. Urinary bladder is normal in appearance.  Stomach/Bowel: Along the proximal aspect of the lesser curvature of the stomach there is a 16 x 9 x 15 mm focus of  apparent enhancement (axial image 90 of series 13 and coronal image 72 of series 15), which was not evident on the preceding cardiac CTA obtained only 12 seconds earlier, which is in the midst of an area of apparent excavation of the mucosa, suspicious for potential ulcerated mass. No pathologic dilatation of small bowel or colon. The appendix is not confidently identified and may be surgically absent. Regardless, there are no inflammatory changes noted adjacent to the cecum to suggest the presence of an acute appendicitis at this time.  Vascular/Lymphatic: Vascular findings and measurements pertinent to potential TAVR procedure, as detailed below. No aneurysm or dissection of the abdominal or pelvic vasculature. Celiac axis, superior mesenteric artery and inferior mesenteric artery are all widely patent without hemodynamically significant stenosis. Single renal arteries bilaterally, with stent at the ostium of the left renal artery. Both renal arteries are grossly patent without hemodynamically significant stenosis. No lymphadenopathy noted in the abdomen or pelvis.  Reproductive: Prostate gland and seminal vesicles are unremarkable in appearance.  Other: No significant volume of ascites.  No pneumoperitoneum.  Musculoskeletal: There are no aggressive appearing lytic or blastic lesions noted in the visualized portions of the skeleton.  VASCULAR MEASUREMENTS PERTINENT TO TAVR:  AORTA:  Minimal Aortic Diameter-13 x 13 mm  Severity of Aortic Calcification -  severe  RIGHT PELVIS:  Right Common Iliac Artery -  Minimal Diameter-5.4 x 4.6 mm  Tortuosity-mild  Calcification-severe  Right External Iliac Artery -  Minimal Diameter-5.2 x 3.9 mm  Tortuosity-moderate  Calcification - severe  Right Common Femoral Artery -  Minimal Diameter-4.5 x 1.6 mm  Tortuosity-mild  Calcification - severe  LEFT PELVIS:  Left Common Iliac Artery  -  Minimal Diameter-6.3 x 3.3 mm  Tortuosity-mild  Calcification - severe  Left External Iliac Artery -  Minimal Diameter-7.4 x 5.0 mm  Tortuosity-moderate  Calcification - severe  Left Common Femoral Artery -  Minimal Diameter-4.1 x 3.3 mm  Tortuosity-mild  Calcification - severe  Review of the MIP images confirms the above findings.  IMPRESSION: 1. Vascular findings and measurements pertinent to potential TAVR procedure, as detailed above. 2. Thickening calcification of the aortic valve, compatible with the reported  clinical history of severe aortic stenosis. 3. Possible ulcerated enhancing gastric mass along the proximal lesser curvature of the stomach, as discussed above. Further evaluation with nonemergent endoscopy is recommended in the near future to exclude the possibility of gastric neoplasm and/or ulcer. 4. Calcifications of the mitral valve and mitral annulus. 5. Aortic atherosclerosis, in addition to left main and 3 vessel coronary artery disease. Status post median sternotomy for CABG including LIMA to the LAD. 6. Small bilateral pleural effusions lying dependently. 7. Additional incidental findings, as above.  These results will be called to the ordering clinician or representative by the Radiologist Assistant, and communication documented in the PACS or zVision Dashboard.  Aortic Atherosclerosis (ICD10-I70.0).   Electronically Signed   By: Vinnie Langton M.D.   On: 08/23/2017 12:00   Impression:  Patient has stage D severe symptomatic aortic stenosis.  He presents with progressive symptoms of exertional shortness of breath and fatigue consistent with chronic diastolic congestive heart failure, New York Heart Association functional class III.  Comorbid medical problems include coronary artery disease status post coronary artery bypass grafting in the remote past, mitral stenosis, pulmonary hypertension, COPD on home oxygen,  cerebrovascular disease, permanent atrial fibrillation on long-term anticoagulation, type 2 diabetes mellitus, and degenerative arthritis with somewhat limited mobility.  I have personally reviewed the patient's recent transthoracic echocardiogram, diagnostic cardiac catheterization, and CT Angie Graham's.  Echocardiogram revealed severe aortic stenosis with preserved left ventricular systolic function.  Aortic valve is trileaflet with severe thickening, calcification, and restricted leaflet mobility involving all 3 leaflets of the aortic valve.  Peak velocity across the aortic valve measured 4.6 m/s corresponding to mean transvalvular gradient estimated 51 mmHg.  There was also report of mitral stenosis, although in my opinion the mitral stenosis appears relatively mild.  Mean transvalvular gradient across the mitral valve was estimated 8 mmHg and the anterior leaflet of the mitral valve seems to move reasonably well.  Diagnostic cardiac catheterization confirmed the presence of severe aortic stenosis.  There was severe native coronary artery disease but continued patency of previous bypass grafts placed to the left anterior descending coronary artery and the left circumflex territory.  The right coronary artery is chronically occluded but fills via left to right collaterals.  Risks associated with conventional surgical aortic valve replacement with or without concomitant coronary artery bypass grafting and/or mitral valve replacement would unquestionably be relatively high.  I would not consider this patient a candidate for conventional surgery.  Cardiac-gated CTA of the heart reveals anatomical characteristics consistent with aortic stenosis suitable for treatment by transcatheter aortic valve replacement without any significant complicating features.  CTA of the abdomen pelvis reveals diffuse atherosclerotic peripheral vascular disease, and the patient probably does not have adequate pelvic vascular access for  transfemoral approach.  Transaxillary approach on the left side appears relatively favorable.  Trans-carotid or trans-apical approaches could be considered.  CTA also reveals a "possible ulcerated enhancing gastric mass" on the lesser curve of the stomach.  The patient has had ulcer surgery in the remote past.  Upper GI endoscopy will need to be performed to rule out significant ulceration and/or malignancy.    Plan:  The patient and his wife were counseled at length regarding treatment alternatives for management of severe symptomatic aortic stenosis. Alternative approaches such as conventional aortic valve replacement, transcatheter aortic valve replacement, and continued medical therapy without intervention were compared and contrasted at length.  The risks associated with conventional surgical aortic valve replacement were discussed in detail,  as were expectations for post-operative convalescence, and why I would be reluctant to consider this patient a candidate for conventional surgery.  Issues specific to transcatheter aortic valve replacement were discussed including questions about long term valve durability, the potential for paravalvular leak, possible increased risk of need for permanent pacemaker placement, and other technical complications related to the procedure itself.  Long-term prognosis with medical therapy was discussed. This discussion was placed in the context of the patient's own specific clinical presentation and past medical history.  Issues regarding the radiographic finding on recent CTA suggestive of possible enhancing ulcerated mass in the stomach were discussed including whether or not it would be wise to proceed with a EGD first to identify any active ulcer disease and/or malignancy versus proceeding with transcatheter aortic valve replacement followed by EGD at a later date.  The patient prefers to proceed with transcatheter aortic valve replacement as soon as possible.  All of  their questions have been addressed.    Following the decision to proceed with transcatheter aortic valve replacement, a discussion has been held regarding what types of management strategies would be attempted intraoperatively in the event of life-threatening complications, including whether or not the patient would be considered a candidate for the use of cardiopulmonary bypass and/or conversion to open sternotomy for attempted surgical intervention.  The patient has been advised of a variety of complications that might develop including but not limited to risks of death, stroke, paravalvular leak, aortic dissection or other major vascular complications, aortic annulus rupture, device embolization, cardiac rupture or perforation, mitral regurgitation, acute myocardial infarction, arrhythmia, heart block or bradycardia requiring permanent pacemaker placement, congestive heart failure, respiratory failure, renal failure, pneumonia, infection, other late complications related to structural valve deterioration or migration, or other complications that might ultimately cause a temporary or permanent loss of functional independence or other long term morbidity.  The patient provides full informed consent for the procedure as described and all questions were answered.   I spent in excess of 90 minutes during the conduct of this office consultation and >50% of this time involved direct face-to-face encounter with the patient for counseling and/or coordination of their care.     Valentina Gu. Roxy Manns, MD 09/09/2017 12:02 PM

## 2017-09-10 ENCOUNTER — Other Ambulatory Visit: Payer: Self-pay

## 2017-09-10 DIAGNOSIS — I350 Nonrheumatic aortic (valve) stenosis: Secondary | ICD-10-CM

## 2017-09-11 ENCOUNTER — Telehealth: Payer: Self-pay | Admitting: Cardiology

## 2017-09-11 NOTE — Telephone Encounter (Signed)
New Message   Patient calling to speak with Daphane Shepherd about his Renal Test, please speak with his wife

## 2017-09-12 NOTE — Pre-Procedure Instructions (Signed)
    ADONNIS SALCEDA  09/12/2017      [facility] - [place], [place] [address] Phone: [phone] Fax: [phone]  [facility] [place], [place] [address] [place] [address] Phone: [phone] Fax: [phone]    Your procedure is scheduled on 09/17/17.  Report to [facility] Admitting at 1030 A.M.  Call this number if you have problems the morning of surgery:  [phone]   Remember:  Do not eat food or drink liquids after midnight.  Take these medicines the morning of surgery with A SIP OF WATER --tylenol,all inhalers,hytin  Do not wear jewelry, make-up or nail polish.  Do not wear lotions, powders, or perfumes, or deodorant.  Do not shave 48 hours prior to surgery.  Men may shave face and neck.  Do not bring valuables to the hospital.  [facility] is not responsible for any belongings or valuables.  Contacts, dentures or bridgework may not be worn into surgery.  Leave your suitcase in the car.  After surgery it may be brought to your room.  For patients admitted to the hospital, discharge time will be determined by your treatment team.  Patients discharged the day of surgery will not be allowed to drive home.   Name and phone number of your drive  Please read over the following fact sheets that you were given. MRSA Information

## 2017-09-13 ENCOUNTER — Encounter (HOSPITAL_COMMUNITY)
Admission: RE | Admit: 2017-09-13 | Discharge: 2017-09-13 | Disposition: A | Discharge status: Home / Self Care | Payer: Medicare Other | Source: Ambulatory Visit | Attending: Surgery | Admitting: Surgery

## 2017-09-13 ENCOUNTER — Encounter (HOSPITAL_COMMUNITY): Payer: Self-pay

## 2017-09-13 ENCOUNTER — Ambulatory Visit (HOSPITAL_COMMUNITY)
Admission: RE | Admit: 2017-09-13 | Discharge: 2017-09-13 | Disposition: A | Discharge status: Home / Self Care | Payer: Medicare Other | Source: Ambulatory Visit | Attending: Cardiovascular Disease | Admitting: Cardiovascular Disease

## 2017-09-13 DIAGNOSIS — J449 Chronic obstructive pulmonary disease, unspecified: Secondary | ICD-10-CM | POA: Insufficient documentation

## 2017-09-13 DIAGNOSIS — Z01818 Encounter for other preprocedural examination: Secondary | ICD-10-CM | POA: Diagnosis not present

## 2017-09-13 DIAGNOSIS — Z79899 Other long term (current) drug therapy: Secondary | ICD-10-CM | POA: Diagnosis not present

## 2017-09-13 DIAGNOSIS — Z01810 Encounter for preprocedural cardiovascular examination: Secondary | ICD-10-CM | POA: Insufficient documentation

## 2017-09-13 DIAGNOSIS — Z951 Presence of aortocoronary bypass graft: Secondary | ICD-10-CM | POA: Diagnosis not present

## 2017-09-13 DIAGNOSIS — E119 Type 2 diabetes mellitus without complications: Secondary | ICD-10-CM | POA: Insufficient documentation

## 2017-09-13 DIAGNOSIS — I350 Nonrheumatic aortic (valve) stenosis: Secondary | ICD-10-CM

## 2017-09-13 DIAGNOSIS — Z01812 Encounter for preprocedural laboratory examination: Secondary | ICD-10-CM | POA: Insufficient documentation

## 2017-09-13 DIAGNOSIS — Z7984 Long term (current) use of oral hypoglycemic drugs: Secondary | ICD-10-CM | POA: Diagnosis not present

## 2017-09-13 DIAGNOSIS — Z9889 Other specified postprocedural states: Secondary | ICD-10-CM | POA: Diagnosis not present

## 2017-09-13 DIAGNOSIS — E785 Hyperlipidemia, unspecified: Secondary | ICD-10-CM | POA: Insufficient documentation

## 2017-09-13 DIAGNOSIS — I4891 Unspecified atrial fibrillation: Secondary | ICD-10-CM | POA: Insufficient documentation

## 2017-09-13 DIAGNOSIS — Z87891 Personal history of nicotine dependence: Secondary | ICD-10-CM | POA: Insufficient documentation

## 2017-09-13 DIAGNOSIS — I509 Heart failure, unspecified: Secondary | ICD-10-CM | POA: Diagnosis not present

## 2017-09-13 DIAGNOSIS — I110 Hypertensive heart disease with heart failure: Secondary | ICD-10-CM | POA: Insufficient documentation

## 2017-09-13 LAB — TYPE AND SCREEN
ABO/RH(D): O POS
Antibody Screen: NEGATIVE

## 2017-09-13 LAB — HEMOGLOBIN A1C
HEMOGLOBIN A1C: 6.1 % — AB (ref 4.8–5.6)
MEAN PLASMA GLUCOSE: 128.37 mg/dL

## 2017-09-13 LAB — GLUCOSE, CAPILLARY: GLUCOSE-CAPILLARY: 197 mg/dL — AB (ref 65–99)

## 2017-09-13 LAB — COMPREHENSIVE METABOLIC PANEL
ALT: 23 U/L (ref 17–63)
ANION GAP: 11 (ref 5–15)
AST: 21 U/L (ref 15–41)
Albumin: 3.7 g/dL (ref 3.5–5.0)
Alkaline Phosphatase: 104 U/L (ref 38–126)
BILIRUBIN TOTAL: 0.8 mg/dL (ref 0.3–1.2)
BUN: 11 mg/dL (ref 6–20)
CO2: 25 mmol/L (ref 22–32)
Calcium: 9.4 mg/dL (ref 8.9–10.3)
Chloride: 103 mmol/L (ref 101–111)
Creatinine, Ser: 0.93 mg/dL (ref 0.61–1.24)
Glucose, Bld: 201 mg/dL — ABNORMAL HIGH (ref 65–99)
POTASSIUM: 3.9 mmol/L (ref 3.5–5.1)
Sodium: 139 mmol/L (ref 135–145)
TOTAL PROTEIN: 6 g/dL — AB (ref 6.5–8.1)

## 2017-09-13 LAB — CBC
HEMATOCRIT: 38.5 % — AB (ref 39.0–52.0)
Hemoglobin: 12.2 g/dL — ABNORMAL LOW (ref 13.0–17.0)
MCH: 28.7 pg (ref 26.0–34.0)
MCHC: 31.7 g/dL (ref 30.0–36.0)
MCV: 90.6 fL (ref 78.0–100.0)
PLATELETS: 189 10*3/uL (ref 150–400)
RBC: 4.25 MIL/uL (ref 4.22–5.81)
RDW: 15.7 % — AB (ref 11.5–15.5)
WBC: 9.5 10*3/uL (ref 4.0–10.5)

## 2017-09-13 LAB — URINALYSIS, ROUTINE W REFLEX MICROSCOPIC
Bilirubin Urine: NEGATIVE
GLUCOSE, UA: NEGATIVE mg/dL
Hgb urine dipstick: NEGATIVE
Ketones, ur: NEGATIVE mg/dL
LEUKOCYTES UA: NEGATIVE
Nitrite: NEGATIVE
PROTEIN: NEGATIVE mg/dL
SPECIFIC GRAVITY, URINE: 1.006 (ref 1.005–1.030)
pH: 6 (ref 5.0–8.0)

## 2017-09-13 LAB — PROTIME-INR
INR: 1.14
Prothrombin Time: 14.5 seconds (ref 11.4–15.2)

## 2017-09-13 LAB — SURGICAL PCR SCREEN
MRSA, PCR: NEGATIVE
STAPHYLOCOCCUS AUREUS: NEGATIVE

## 2017-09-13 LAB — APTT: aPTT: 32 seconds (ref 24–36)

## 2017-09-13 LAB — BRAIN NATRIURETIC PEPTIDE: B Natriuretic Peptide: 245.9 pg/mL — ABNORMAL HIGH (ref 0.0–100.0)

## 2017-09-16 MED ORDER — SODIUM CHLORIDE 0.9 % IV SOLN
INTRAVENOUS | Status: DC
  Filled 2017-09-16: qty 30

## 2017-09-16 MED ORDER — SODIUM CHLORIDE 0.9 % IV SOLN
INTRAVENOUS | Status: DC
  Filled 2017-09-16: qty 1

## 2017-09-16 MED ORDER — SODIUM CHLORIDE 0.9 % IV SOLN
30.0000 ug/min | INTRAVENOUS | Status: DC
  Filled 2017-09-16: qty 2

## 2017-09-16 MED ORDER — EPINEPHRINE PF 1 MG/ML IJ SOLN
0.0000 ug/min | INTRAMUSCULAR | Status: DC
  Filled 2017-09-16: qty 4

## 2017-09-16 MED ORDER — NITROGLYCERIN IN D5W 200-5 MCG/ML-% IV SOLN
2.0000 ug/min | INTRAVENOUS | Status: DC
  Filled 2017-09-16: qty 250

## 2017-09-16 MED ORDER — NOREPINEPHRINE BITARTRATE 1 MG/ML IV SOLN
0.0000 ug/min | INTRAVENOUS | Status: AC
  Administered 2017-09-17: 4 ug/min via INTRAVENOUS
  Filled 2017-09-16: qty 4

## 2017-09-16 MED ORDER — DEXMEDETOMIDINE HCL IN NACL 400 MCG/100ML IV SOLN
0.1000 ug/kg/h | INTRAVENOUS | Status: DC
  Filled 2017-09-16: qty 100

## 2017-09-16 MED ORDER — POTASSIUM CHLORIDE 2 MEQ/ML IV SOLN
80.0000 meq | INTRAVENOUS | Status: DC
Start: 2017-09-17 — End: 2017-09-17
  Filled 2017-09-16: qty 40

## 2017-09-16 MED ORDER — SODIUM CHLORIDE 0.9 % IV SOLN
1.5000 g | INTRAVENOUS | Status: DC
  Filled 2017-09-16: qty 1.5

## 2017-09-16 MED ORDER — MAGNESIUM SULFATE 50 % IJ SOLN
40.0000 meq | INTRAMUSCULAR | Status: DC
Start: 2017-09-17 — End: 2017-09-17
  Filled 2017-09-16: qty 9.85

## 2017-09-16 MED ORDER — DOPAMINE-DEXTROSE 3.2-5 MG/ML-% IV SOLN
0.0000 ug/kg/min | INTRAVENOUS | Status: DC
  Filled 2017-09-16: qty 250

## 2017-09-16 MED ORDER — VANCOMYCIN HCL 10 G IV SOLR
1500.0000 mg | INTRAVENOUS | Status: DC
  Filled 2017-09-16: qty 1500

## 2017-09-17 ENCOUNTER — Encounter (HOSPITAL_COMMUNITY): Payer: Self-pay | Admitting: *Deleted

## 2017-09-17 ENCOUNTER — Encounter (HOSPITAL_COMMUNITY)
Admission: RE | Disposition: A | Discharge status: Home Health | Payer: Self-pay | Source: Ambulatory Visit | Attending: Surgery

## 2017-09-17 ENCOUNTER — Inpatient Hospital Stay (HOSPITAL_COMMUNITY): Payer: Medicare Other | Admitting: Emergency Medicine

## 2017-09-17 ENCOUNTER — Ambulatory Visit (HOSPITAL_COMMUNITY): Payer: Medicare Other

## 2017-09-17 ENCOUNTER — Inpatient Hospital Stay (HOSPITAL_COMMUNITY): Payer: Medicare Other

## 2017-09-17 ENCOUNTER — Inpatient Hospital Stay (HOSPITAL_COMMUNITY)
Admission: RE | Admit: 2017-09-17 | Discharge: 2017-09-21 | DRG: 267 | Disposition: A | Discharge status: Home Health | Payer: Medicare Other | Source: Ambulatory Visit | Attending: Surgery | Admitting: Surgery

## 2017-09-17 ENCOUNTER — Inpatient Hospital Stay (HOSPITAL_COMMUNITY): Payer: Medicare Other | Admitting: Anesthesiology

## 2017-09-17 ENCOUNTER — Other Ambulatory Visit (HOSPITAL_COMMUNITY): Payer: Self-pay | Admitting: *Deleted

## 2017-09-17 DIAGNOSIS — E785 Hyperlipidemia, unspecified: Secondary | ICD-10-CM | POA: Diagnosis present

## 2017-09-17 DIAGNOSIS — J449 Chronic obstructive pulmonary disease, unspecified: Secondary | ICD-10-CM | POA: Diagnosis present

## 2017-09-17 DIAGNOSIS — Z951 Presence of aortocoronary bypass graft: Secondary | ICD-10-CM | POA: Diagnosis not present

## 2017-09-17 DIAGNOSIS — I2510 Atherosclerotic heart disease of native coronary artery without angina pectoris: Secondary | ICD-10-CM | POA: Diagnosis present

## 2017-09-17 DIAGNOSIS — I2582 Chronic total occlusion of coronary artery: Secondary | ICD-10-CM | POA: Diagnosis present

## 2017-09-17 DIAGNOSIS — K219 Gastro-esophageal reflux disease without esophagitis: Secondary | ICD-10-CM | POA: Diagnosis present

## 2017-09-17 DIAGNOSIS — J811 Chronic pulmonary edema: Secondary | ICD-10-CM | POA: Diagnosis not present

## 2017-09-17 DIAGNOSIS — I272 Pulmonary hypertension, unspecified: Secondary | ICD-10-CM | POA: Diagnosis present

## 2017-09-17 DIAGNOSIS — I252 Old myocardial infarction: Secondary | ICD-10-CM | POA: Diagnosis not present

## 2017-09-17 DIAGNOSIS — Z8349 Family history of other endocrine, nutritional and metabolic diseases: Secondary | ICD-10-CM

## 2017-09-17 DIAGNOSIS — I5032 Chronic diastolic (congestive) heart failure: Secondary | ICD-10-CM | POA: Diagnosis present

## 2017-09-17 DIAGNOSIS — I679 Cerebrovascular disease, unspecified: Secondary | ICD-10-CM | POA: Diagnosis present

## 2017-09-17 DIAGNOSIS — Z954 Presence of other heart-valve replacement: Secondary | ICD-10-CM | POA: Diagnosis not present

## 2017-09-17 DIAGNOSIS — I700 Atherosclerosis of aorta: Secondary | ICD-10-CM | POA: Diagnosis present

## 2017-09-17 DIAGNOSIS — M1611 Unilateral primary osteoarthritis, right hip: Secondary | ICD-10-CM | POA: Diagnosis present

## 2017-09-17 DIAGNOSIS — I350 Nonrheumatic aortic (valve) stenosis: Secondary | ICD-10-CM

## 2017-09-17 DIAGNOSIS — G4730 Sleep apnea, unspecified: Secondary | ICD-10-CM | POA: Diagnosis present

## 2017-09-17 DIAGNOSIS — Z006 Encounter for examination for normal comparison and control in clinical research program: Secondary | ICD-10-CM

## 2017-09-17 DIAGNOSIS — E1122 Type 2 diabetes mellitus with diabetic chronic kidney disease: Secondary | ICD-10-CM | POA: Diagnosis present

## 2017-09-17 DIAGNOSIS — Z9861 Coronary angioplasty status: Secondary | ICD-10-CM

## 2017-09-17 DIAGNOSIS — Z8711 Personal history of peptic ulcer disease: Secondary | ICD-10-CM

## 2017-09-17 DIAGNOSIS — M170 Bilateral primary osteoarthritis of knee: Secondary | ICD-10-CM | POA: Diagnosis present

## 2017-09-17 DIAGNOSIS — Z7984 Long term (current) use of oral hypoglycemic drugs: Secondary | ICD-10-CM

## 2017-09-17 DIAGNOSIS — Z8249 Family history of ischemic heart disease and other diseases of the circulatory system: Secondary | ICD-10-CM

## 2017-09-17 DIAGNOSIS — I080 Rheumatic disorders of both mitral and aortic valves: Secondary | ICD-10-CM | POA: Diagnosis not present

## 2017-09-17 DIAGNOSIS — E1151 Type 2 diabetes mellitus with diabetic peripheral angiopathy without gangrene: Secondary | ICD-10-CM | POA: Diagnosis present

## 2017-09-17 DIAGNOSIS — Z952 Presence of prosthetic heart valve: Secondary | ICD-10-CM

## 2017-09-17 DIAGNOSIS — I110 Hypertensive heart disease with heart failure: Secondary | ICD-10-CM | POA: Diagnosis present

## 2017-09-17 DIAGNOSIS — Z9981 Dependence on supplemental oxygen: Secondary | ICD-10-CM

## 2017-09-17 DIAGNOSIS — I10 Essential (primary) hypertension: Secondary | ICD-10-CM | POA: Diagnosis not present

## 2017-09-17 DIAGNOSIS — R0989 Other specified symptoms and signs involving the circulatory and respiratory systems: Secondary | ICD-10-CM | POA: Diagnosis not present

## 2017-09-17 DIAGNOSIS — I482 Chronic atrial fibrillation: Secondary | ICD-10-CM | POA: Diagnosis present

## 2017-09-17 DIAGNOSIS — Z881 Allergy status to other antibiotic agents status: Secondary | ICD-10-CM

## 2017-09-17 DIAGNOSIS — Z7902 Long term (current) use of antithrombotics/antiplatelets: Secondary | ICD-10-CM

## 2017-09-17 DIAGNOSIS — R339 Retention of urine, unspecified: Secondary | ICD-10-CM | POA: Diagnosis not present

## 2017-09-17 HISTORY — PX: TEE WITHOUT CARDIOVERSION: SHX5443

## 2017-09-17 HISTORY — DX: Nonrheumatic aortic (valve) stenosis: I35.0

## 2017-09-17 LAB — POCT I-STAT, CHEM 8
BUN: 9 mg/dL (ref 6–20)
BUN: 9 mg/dL (ref 6–20)
CALCIUM ION: 1.21 mmol/L (ref 1.15–1.40)
CREATININE: 0.7 mg/dL (ref 0.61–1.24)
Calcium, Ion: 1.2 mmol/L (ref 1.15–1.40)
Chloride: 103 mmol/L (ref 101–111)
Chloride: 103 mmol/L (ref 101–111)
Creatinine, Ser: 0.7 mg/dL (ref 0.61–1.24)
Glucose, Bld: 132 mg/dL — ABNORMAL HIGH (ref 65–99)
Glucose, Bld: 163 mg/dL — ABNORMAL HIGH (ref 65–99)
HEMATOCRIT: 33 % — AB (ref 39.0–52.0)
HEMATOCRIT: 32 % — AB (ref 39.0–52.0)
Hemoglobin: 11.2 g/dL — ABNORMAL LOW (ref 13.0–17.0)
Hemoglobin: 10.9 g/dL — ABNORMAL LOW (ref 13.0–17.0)
POTASSIUM: 3.2 mmol/L — AB (ref 3.5–5.1)
Potassium: 3.5 mmol/L (ref 3.5–5.1)
SODIUM: 142 mmol/L (ref 135–145)
Sodium: 140 mmol/L (ref 135–145)
TCO2: 26 mmol/L (ref 22–32)
TCO2: 26 mmol/L (ref 22–32)

## 2017-09-17 LAB — GLUCOSE, CAPILLARY
GLUCOSE-CAPILLARY: 130 mg/dL — AB (ref 65–99)
GLUCOSE-CAPILLARY: 160 mg/dL — AB (ref 65–99)
Glucose-Capillary: 139 mg/dL — ABNORMAL HIGH (ref 65–99)

## 2017-09-17 LAB — BLOOD GAS, ARTERIAL
Acid-Base Excess: 3.4 mmol/L — ABNORMAL HIGH (ref 0.0–2.0)
BICARBONATE: 27.9 mmol/L (ref 20.0–28.0)
DRAWN BY: 449841
O2 Content: 2 L/min
O2 Saturation: 95 %
PATIENT TEMPERATURE: 98.6
PH ART: 7.394 (ref 7.350–7.450)
pCO2 arterial: 46.7 mmHg (ref 32.0–48.0)
pO2, Arterial: 79.4 mmHg — ABNORMAL LOW (ref 83.0–108.0)

## 2017-09-17 SURGERY — IMPLANTATION, AORTIC VALVE, TRANSCATHETER, SUBCLAVIAN ARTERY APPROACH
Anesthesia: Monitor Anesthesia Care

## 2017-09-17 MED ORDER — ONDANSETRON HCL 4 MG/2ML IJ SOLN
INTRAMUSCULAR | Status: AC
  Filled 2017-09-17: qty 6

## 2017-09-17 MED ORDER — CHLORHEXIDINE GLUCONATE 0.12 % MT SOLN
15.0000 mL | Freq: Once | OROMUCOSAL | Status: DC
  Administered 2017-09-17: 15 mL via OROMUCOSAL

## 2017-09-17 MED ORDER — SODIUM CHLORIDE 0.9% FLUSH
3.0000 mL | Freq: Two times a day (BID) | INTRAVENOUS | Status: DC
  Administered 2017-09-18: 3 mL via INTRAVENOUS

## 2017-09-17 MED ORDER — TERAZOSIN HCL 5 MG PO CAPS
5.0000 mg | ORAL_CAPSULE | Freq: Every day | ORAL | Status: DC
  Administered 2017-09-17 – 2017-09-18 (×2): 5 mg via ORAL
  Filled 2017-09-17 (×3): qty 1

## 2017-09-17 MED ORDER — LABETALOL HCL 5 MG/ML IV SOLN
INTRAVENOUS | Status: AC
  Filled 2017-09-17: qty 4

## 2017-09-17 MED ORDER — SUGAMMADEX SODIUM 200 MG/2ML IV SOLN
INTRAVENOUS | Status: AC
  Filled 2017-09-17: qty 2

## 2017-09-17 MED ORDER — SODIUM CHLORIDE 0.9 % IV SOLN
1.0000 mL/kg/h | INTRAVENOUS | Status: DC
  Administered 2017-09-17: 1 mL/kg/h via INTRAVENOUS

## 2017-09-17 MED ORDER — SODIUM CHLORIDE 0.9 % IV SOLN
INTRAVENOUS | Status: AC
  Filled 2017-09-17 (×3): qty 1.2

## 2017-09-17 MED ORDER — EPHEDRINE 5 MG/ML INJ
INTRAVENOUS | Status: AC
  Filled 2017-09-17: qty 10

## 2017-09-17 MED ORDER — SUCCINYLCHOLINE CHLORIDE 200 MG/10ML IV SOSY
PREFILLED_SYRINGE | INTRAVENOUS | Status: AC
  Filled 2017-09-17: qty 30

## 2017-09-17 MED ORDER — HYDRALAZINE HCL 20 MG/ML IJ SOLN
INTRAMUSCULAR | Status: DC | PRN
  Administered 2017-09-17: 5 mg via INTRAVENOUS

## 2017-09-17 MED ORDER — TRAMADOL HCL 50 MG PO TABS
50.0000 mg | ORAL_TABLET | ORAL | Status: DC | PRN
  Administered 2017-09-18: 100 mg via ORAL
  Filled 2017-09-17: qty 2

## 2017-09-17 MED ORDER — BRIMONIDINE TARTRATE 0.2 % OP SOLN
1.0000 [drp] | Freq: Two times a day (BID) | OPHTHALMIC | Status: DC
  Administered 2017-09-17 – 2017-09-21 (×8): 1 [drp] via OPHTHALMIC
  Filled 2017-09-17 (×2): qty 5

## 2017-09-17 MED ORDER — ONDANSETRON HCL 4 MG/2ML IJ SOLN
4.0000 mg | Freq: Four times a day (QID) | INTRAMUSCULAR | Status: DC | PRN
  Administered 2017-09-19 – 2017-09-20 (×2): 4 mg via INTRAVENOUS
  Filled 2017-09-17 (×2): qty 2

## 2017-09-17 MED ORDER — FENTANYL CITRATE (PF) 100 MCG/2ML IJ SOLN
50.0000 ug | Freq: Once | INTRAMUSCULAR | Status: AC
  Administered 2017-09-17: 50 ug via INTRAVENOUS

## 2017-09-17 MED ORDER — VANCOMYCIN HCL IN DEXTROSE 1-5 GM/200ML-% IV SOLN
1000.0000 mg | Freq: Once | INTRAVENOUS | Status: AC
  Administered 2017-09-18: 1000 mg via INTRAVENOUS
  Filled 2017-09-17: qty 200

## 2017-09-17 MED ORDER — SUGAMMADEX SODIUM 200 MG/2ML IV SOLN
INTRAVENOUS | Status: DC | PRN
  Administered 2017-09-17: 200 mg via INTRAVENOUS

## 2017-09-17 MED ORDER — IPRATROPIUM-ALBUTEROL 0.5-2.5 (3) MG/3ML IN SOLN
3.0000 mL | Freq: Three times a day (TID) | RESPIRATORY_TRACT | Status: DC
  Administered 2017-09-18 – 2017-09-20 (×7): 3 mL via RESPIRATORY_TRACT
  Filled 2017-09-17 (×8): qty 3

## 2017-09-17 MED ORDER — ATORVASTATIN CALCIUM 80 MG PO TABS
80.0000 mg | ORAL_TABLET | Freq: Every day | ORAL | Status: DC
  Administered 2017-09-17 – 2017-09-20 (×4): 80 mg via ORAL
  Filled 2017-09-17 (×4): qty 1

## 2017-09-17 MED ORDER — HEPARIN SODIUM (PORCINE) 1000 UNIT/ML IJ SOLN
INTRAMUSCULAR | Status: DC | PRN
  Administered 2017-09-17: 16000 [IU] via INTRAVENOUS

## 2017-09-17 MED ORDER — NOREPINEPHRINE BITARTRATE 1 MG/ML IV SOLN: 0.0000 ug/min | INTRAVENOUS | Status: DC

## 2017-09-17 MED ORDER — SODIUM CHLORIDE 0.9 % IV SOLN
INTRAVENOUS | Status: DC | PRN
  Administered 2017-09-17: 1.5 g via INTRAVENOUS

## 2017-09-17 MED ORDER — ACYCLOVIR 400 MG PO TABS
400.0000 mg | ORAL_TABLET | Freq: Two times a day (BID) | ORAL | Status: DC
  Administered 2017-09-17 – 2017-09-21 (×8): 400 mg via ORAL
  Filled 2017-09-17 (×10): qty 1

## 2017-09-17 MED ORDER — VANCOMYCIN HCL IN DEXTROSE 1-5 GM/200ML-% IV SOLN
1000.0000 mg | Freq: Once | INTRAVENOUS | Status: AC
  Administered 2017-09-17: 1500 mg via INTRAVENOUS
  Filled 2017-09-17: qty 200

## 2017-09-17 MED ORDER — ROCURONIUM BROMIDE 10 MG/ML (PF) SYRINGE
PREFILLED_SYRINGE | INTRAVENOUS | Status: AC
  Filled 2017-09-17: qty 10

## 2017-09-17 MED ORDER — INSULIN ASPART 100 UNIT/ML ~~LOC~~ SOLN
0.0000 [IU] | Freq: Three times a day (TID) | SUBCUTANEOUS | Status: DC
  Administered 2017-09-19 (×2): 3 [IU] via SUBCUTANEOUS
  Administered 2017-09-19 – 2017-09-20 (×3): 2 [IU] via SUBCUTANEOUS
  Administered 2017-09-21: 3 [IU] via SUBCUTANEOUS
  Administered 2017-09-21: 2 [IU] via SUBCUTANEOUS

## 2017-09-17 MED ORDER — NITROGLYCERIN IN D5W 200-5 MCG/ML-% IV SOLN
INTRAVENOUS | Status: AC
  Filled 2017-09-17: qty 250

## 2017-09-17 MED ORDER — PROPOFOL 10 MG/ML IV BOLUS
INTRAVENOUS | Status: DC | PRN
  Administered 2017-09-17: 100 mg via INTRAVENOUS

## 2017-09-17 MED ORDER — MORPHINE SULFATE (PF) 2 MG/ML IV SOLN: 2.0000 mg | INTRAVENOUS | Status: DC | PRN

## 2017-09-17 MED ORDER — PHENYLEPHRINE HCL 10 MG/ML IJ SOLN
INTRAMUSCULAR | Status: DC | PRN
  Administered 2017-09-17: 40 ug via INTRAVENOUS

## 2017-09-17 MED ORDER — OCUVITE-LUTEIN PO CAPS: 1.0000 | ORAL_CAPSULE | Freq: Every day | ORAL | Status: DC

## 2017-09-17 MED ORDER — PHENYLEPHRINE 40 MCG/ML (10ML) SYRINGE FOR IV PUSH (FOR BLOOD PRESSURE SUPPORT)
PREFILLED_SYRINGE | INTRAVENOUS | Status: DC | PRN
  Administered 2017-09-17: 40 ug via INTRAVENOUS

## 2017-09-17 MED ORDER — LORATADINE 10 MG PO TABS
10.0000 mg | ORAL_TABLET | Freq: Every day | ORAL | Status: DC
  Administered 2017-09-18 – 2017-09-21 (×4): 10 mg via ORAL
  Filled 2017-09-17 (×4): qty 1

## 2017-09-17 MED ORDER — FENTANYL CITRATE (PF) 250 MCG/5ML IJ SOLN
INTRAMUSCULAR | Status: AC
  Filled 2017-09-17: qty 5

## 2017-09-17 MED ORDER — CEFAZOLIN SODIUM-DEXTROSE 2-4 GM/100ML-% IV SOLN
2.0000 g | Freq: Three times a day (TID) | INTRAVENOUS | Status: DC
  Administered 2017-09-17 – 2017-09-18 (×3): 2 g via INTRAVENOUS
  Filled 2017-09-17 (×5): qty 100

## 2017-09-17 MED ORDER — CHLORHEXIDINE GLUCONATE 0.12 % MT SOLN
OROMUCOSAL | Status: AC
  Administered 2017-09-17: 15 mL
  Filled 2017-09-17: qty 15

## 2017-09-17 MED ORDER — CHLORHEXIDINE GLUCONATE 0.12 % MT SOLN
15.0000 mL | OROMUCOSAL | Status: AC
  Administered 2017-09-17: 15 mL via OROMUCOSAL

## 2017-09-17 MED ORDER — PHENYLEPHRINE 40 MCG/ML (10ML) SYRINGE FOR IV PUSH (FOR BLOOD PRESSURE SUPPORT)
PREFILLED_SYRINGE | INTRAVENOUS | Status: AC
  Filled 2017-09-17: qty 20

## 2017-09-17 MED ORDER — METOPROLOL TARTRATE 5 MG/5ML IV SOLN
2.5000 mg | INTRAVENOUS | Status: DC | PRN
  Administered 2017-09-17: 5 mg via INTRAVENOUS
  Filled 2017-09-17: qty 5

## 2017-09-17 MED ORDER — NITROGLYCERIN IN D5W 200-5 MCG/ML-% IV SOLN: 0.0000 ug/min | INTRAVENOUS | Status: DC

## 2017-09-17 MED ORDER — 0.9 % SODIUM CHLORIDE (POUR BTL) OPTIME
TOPICAL | Status: DC | PRN
  Administered 2017-09-17: 3000 mL

## 2017-09-17 MED ORDER — MORPHINE SULFATE (PF) 4 MG/ML IV SOLN
INTRAVENOUS | Status: AC
  Administered 2017-09-17: 4 mg
  Filled 2017-09-17: qty 1

## 2017-09-17 MED ORDER — LABETALOL HCL 5 MG/ML IV SOLN
INTRAVENOUS | Status: DC | PRN
  Administered 2017-09-17: 5 mg via INTRAVENOUS

## 2017-09-17 MED ORDER — FENTANYL CITRATE (PF) 100 MCG/2ML IJ SOLN: 50.0000 ug | Freq: Once | INTRAMUSCULAR | Status: AC

## 2017-09-17 MED ORDER — LACTATED RINGERS IV SOLN: 500.0000 mL | Freq: Once | INTRAVENOUS | Status: DC | PRN

## 2017-09-17 MED ORDER — MIDAZOLAM HCL 2 MG/2ML IJ SOLN: 1.0000 mg | Freq: Once | INTRAMUSCULAR | Status: DC

## 2017-09-17 MED ORDER — IODIXANOL 320 MG/ML IV SOLN
INTRAVENOUS | Status: DC | PRN
  Administered 2017-09-17: 61.2 mL via INTRAVENOUS

## 2017-09-17 MED ORDER — LACTATED RINGERS IV SOLN
INTRAVENOUS | Status: DC | PRN
  Administered 2017-09-17: 13:00:00 via INTRAVENOUS

## 2017-09-17 MED ORDER — CHLORHEXIDINE GLUCONATE 4 % EX LIQD
60.0000 mL | Freq: Once | CUTANEOUS | Status: DC
  Administered 2017-09-17: 4 via TOPICAL

## 2017-09-17 MED ORDER — SUCCINYLCHOLINE CHLORIDE 200 MG/10ML IV SOSY
PREFILLED_SYRINGE | INTRAVENOUS | Status: DC | PRN
  Administered 2017-09-17: 120 mg via INTRAVENOUS

## 2017-09-17 MED ORDER — PROTAMINE SULFATE 10 MG/ML IV SOLN
INTRAVENOUS | Status: AC
  Filled 2017-09-17: qty 25

## 2017-09-17 MED ORDER — LIDOCAINE HCL (PF) 1 % IJ SOLN
INTRAMUSCULAR | Status: AC
  Filled 2017-09-17: qty 30

## 2017-09-17 MED ORDER — LACTATED RINGERS IV SOLN
INTRAVENOUS | Status: DC
  Administered 2017-09-17: 11:00:00 via INTRAVENOUS

## 2017-09-17 MED ORDER — PROPOFOL 10 MG/ML IV BOLUS
INTRAVENOUS | Status: AC
  Filled 2017-09-17: qty 20

## 2017-09-17 MED ORDER — OXYCODONE HCL 5 MG PO TABS
5.0000 mg | ORAL_TABLET | ORAL | Status: DC | PRN
  Administered 2017-09-17 – 2017-09-18 (×3): 10 mg via ORAL
  Administered 2017-09-19: 5 mg via ORAL
  Administered 2017-09-20: 10 mg via ORAL
  Filled 2017-09-17 (×4): qty 2

## 2017-09-17 MED ORDER — SODIUM CHLORIDE 0.9 % IV SOLN
INTRAVENOUS | Status: DC | PRN
  Administered 2017-09-17: 1500 mL

## 2017-09-17 MED ORDER — LIDOCAINE 2% (20 MG/ML) 5 ML SYRINGE
INTRAMUSCULAR | Status: DC | PRN
  Administered 2017-09-17: 70 mg via INTRAVENOUS

## 2017-09-17 MED ORDER — ALBUTEROL SULFATE (2.5 MG/3ML) 0.083% IN NEBU
2.5000 mg | INHALATION_SOLUTION | RESPIRATORY_TRACT | Status: DC | PRN
  Administered 2017-09-17: 2.5 mg via RESPIRATORY_TRACT
  Filled 2017-09-17: qty 3

## 2017-09-17 MED ORDER — CHLORHEXIDINE GLUCONATE 4 % EX LIQD: 30.0000 mL | CUTANEOUS | Status: DC

## 2017-09-17 MED ORDER — PROTAMINE SULFATE 10 MG/ML IV SOLN
INTRAVENOUS | Status: DC | PRN
  Administered 2017-09-17: 150 mg via INTRAVENOUS
  Administered 2017-09-17: 10 mg via INTRAVENOUS

## 2017-09-17 MED ORDER — MIDAZOLAM HCL 2 MG/2ML IJ SOLN
INTRAMUSCULAR | Status: AC
  Administered 2017-09-17: 1 mg
  Filled 2017-09-17: qty 2

## 2017-09-17 MED ORDER — PANTOPRAZOLE SODIUM 40 MG PO TBEC
40.0000 mg | DELAYED_RELEASE_TABLET | Freq: Every day | ORAL | Status: DC
Start: 2017-09-18 — End: 2017-09-21
  Administered 2017-09-18 – 2017-09-21 (×4): 40 mg via ORAL
  Filled 2017-09-17 (×4): qty 1

## 2017-09-17 MED ORDER — ALBUMIN HUMAN 5 % IV SOLN: 250.0000 mL | INTRAVENOUS | Status: DC | PRN

## 2017-09-17 MED ORDER — SODIUM CHLORIDE 0.9 % IV SOLN: 250.0000 mL | INTRAVENOUS | Status: DC | PRN

## 2017-09-17 MED ORDER — LIDOCAINE 2% (20 MG/ML) 5 ML SYRINGE
INTRAMUSCULAR | Status: AC
  Filled 2017-09-17: qty 5

## 2017-09-17 MED ORDER — HEPARIN SODIUM (PORCINE) 1000 UNIT/ML IJ SOLN
INTRAMUSCULAR | Status: AC
  Filled 2017-09-17: qty 1

## 2017-09-17 MED ORDER — MIDAZOLAM HCL 2 MG/2ML IJ SOLN
1.0000 mg | Freq: Once | INTRAMUSCULAR | Status: AC
  Administered 2017-09-17: 1 mg via INTRAVENOUS

## 2017-09-17 MED ORDER — FENTANYL CITRATE (PF) 100 MCG/2ML IJ SOLN
INTRAMUSCULAR | Status: AC
  Administered 2017-09-17: 50 ug
  Filled 2017-09-17: qty 2

## 2017-09-17 MED ORDER — ROCURONIUM BROMIDE 100 MG/10ML IV SOLN
INTRAVENOUS | Status: DC | PRN
  Administered 2017-09-17: 20 mg via INTRAVENOUS
  Administered 2017-09-17: 30 mg via INTRAVENOUS

## 2017-09-17 MED ORDER — FENTANYL CITRATE (PF) 250 MCG/5ML IJ SOLN
INTRAMUSCULAR | Status: DC | PRN
  Administered 2017-09-17: 100 ug via INTRAVENOUS
  Administered 2017-09-17: 50 ug via INTRAVENOUS

## 2017-09-17 MED ORDER — PROSIGHT PO TABS
1.0000 | ORAL_TABLET | Freq: Every day | ORAL | Status: DC
  Administered 2017-09-18 – 2017-09-21 (×4): 1 via ORAL
  Filled 2017-09-17 (×5): qty 1

## 2017-09-17 MED ORDER — VITAMIN D 1000 UNITS PO TABS
2000.0000 [IU] | ORAL_TABLET | Freq: Every day | ORAL | Status: DC
  Administered 2017-09-18 – 2017-09-21 (×4): 2000 [IU] via ORAL
  Filled 2017-09-17 (×7): qty 2

## 2017-09-17 MED ORDER — ONDANSETRON HCL 4 MG/2ML IJ SOLN
INTRAMUSCULAR | Status: DC | PRN
  Administered 2017-09-17: 4 mg via INTRAVENOUS

## 2017-09-17 MED ORDER — FERROUS SULFATE 325 (65 FE) MG PO TABS
325.0000 mg | ORAL_TABLET | Freq: Every day | ORAL | Status: DC
  Administered 2017-09-18 – 2017-09-21 (×4): 325 mg via ORAL
  Filled 2017-09-17 (×4): qty 1

## 2017-09-17 MED ORDER — HYDRALAZINE HCL 20 MG/ML IJ SOLN
INTRAMUSCULAR | Status: AC
  Filled 2017-09-17: qty 1

## 2017-09-17 MED ORDER — METOPROLOL TARTRATE 50 MG PO TABS
50.0000 mg | ORAL_TABLET | Freq: Two times a day (BID) | ORAL | Status: DC
  Administered 2017-09-17 – 2017-09-21 (×8): 50 mg via ORAL
  Filled 2017-09-17 (×8): qty 1

## 2017-09-17 MED ORDER — SODIUM CHLORIDE 0.9% FLUSH: 3.0000 mL | INTRAVENOUS | Status: DC | PRN

## 2017-09-17 MED ORDER — ALBUTEROL 90 MCG/ACT IN AERS: 2.0000 | INHALATION_SPRAY | RESPIRATORY_TRACT | Status: DC | PRN

## 2017-09-17 MED ORDER — SODIUM CHLORIDE 0.9 % IV SOLN: INTRAVENOUS | Status: DC

## 2017-09-17 MED ORDER — MIDAZOLAM HCL 2 MG/2ML IJ SOLN: 2.0000 mg | INTRAMUSCULAR | Status: DC | PRN

## 2017-09-17 MED ORDER — ASPIRIN EC 81 MG PO TBEC
81.0000 mg | DELAYED_RELEASE_TABLET | Freq: Every day | ORAL | Status: DC
Start: 2017-09-17 — End: 2017-09-21
  Administered 2017-09-18 – 2017-09-21 (×4): 81 mg via ORAL
  Filled 2017-09-17 (×4): qty 1

## 2017-09-17 SURGICAL SUPPLY — 118 items
ADAPTER UNIV SWAN GANZ BIP (ADAPTER) ×2 IMPLANT
ADAPTER UNV SWAN GANZ BIP (ADAPTER) ×2
ADH SKN CLS APL DERMABOND .7 (GAUZE/BANDAGES/DRESSINGS) ×2
ADPR CATH UNV NS SG CATH (ADAPTER) ×2
BAG BANDED W/RUBBER/TAPE 36X54 (MISCELLANEOUS) ×2 IMPLANT
BAG DECANTER FOR FLEXI CONT (MISCELLANEOUS) ×2 IMPLANT
BAG EQP BAND 135X91 W/RBR TAPE (MISCELLANEOUS)
BAG SNAP BAND KOVER 36X36 (MISCELLANEOUS) ×6 IMPLANT
BLADE CLIPPER SURG (BLADE) IMPLANT
BLADE OSCILLATING /SAGITTAL (BLADE) IMPLANT
BLADE STERNUM SYSTEM 6 (BLADE) ×2 IMPLANT
CABLE ADAPT CONN TEMP 6FT (ADAPTER) ×4 IMPLANT
CANNULA FEM VENOUS REMOTE 22FR (CANNULA) IMPLANT
CANNULA OPTISITE PERFUSION 16F (CANNULA) IMPLANT
CANNULA OPTISITE PERFUSION 18F (CANNULA) IMPLANT
CATH DIAG EXPO 6F AL2 (CATHETERS) ×2 IMPLANT
CATH DIAG EXPO 6F VENT PIG 145 (CATHETERS) ×8 IMPLANT
CATH EXPO 5FR AL1 (CATHETERS) ×2 IMPLANT
CATH S G BIP PACING (SET/KITS/TRAYS/PACK) ×6 IMPLANT
CLIP VESOCCLUDE MED 24/CT (CLIP) ×2 IMPLANT
CLIP VESOCCLUDE SM WIDE 24/CT (CLIP) ×2 IMPLANT
CONT SPEC 4OZ CLIKSEAL STRL BL (MISCELLANEOUS) ×18 IMPLANT
COVER BACK TABLE 24X17X13 BIG (DRAPES) ×2 IMPLANT
COVER BACK TABLE 60X90IN (DRAPES) ×4 IMPLANT
COVER BACK TABLE 80X110 HD (DRAPES) ×4 IMPLANT
COVER DOME SNAP 22 D (MISCELLANEOUS) ×2 IMPLANT
COVER MAYO STAND STRL (DRAPES) ×2 IMPLANT
COVER PROBE W GEL 5X96 (DRAPES) ×2 IMPLANT
CRADLE DONUT ADULT HEAD (MISCELLANEOUS) ×4 IMPLANT
DERMABOND ADVANCED (GAUZE/BANDAGES/DRESSINGS) ×2
DERMABOND ADVANCED .7 DNX12 (GAUZE/BANDAGES/DRESSINGS) ×2 IMPLANT
DEVICE CLOSURE PERCLS PRGLD 6F (VASCULAR PRODUCTS) ×4 IMPLANT
DRAPE INCISE IOBAN 66X45 STRL (DRAPES) IMPLANT
DRAPE SLUSH MACHINE 52X66 (DRAPES) ×4 IMPLANT
DRSG EMULSION OIL 3X3 NADH (GAUZE/BANDAGES/DRESSINGS) ×2 IMPLANT
DRSG TEGADERM 4X4.75 (GAUZE/BANDAGES/DRESSINGS) ×6 IMPLANT
ELECT CAUTERY BLADE 6.4 (BLADE) ×2 IMPLANT
ELECT REM PT RETURN 9FT ADLT (ELECTROSURGICAL) ×4
ELECTRODE REM PT RTRN 9FT ADLT (ELECTROSURGICAL) ×4 IMPLANT
FELT TEFLON 1X6 (MISCELLANEOUS) ×2 IMPLANT
FELT TEFLON 6X6 (MISCELLANEOUS) IMPLANT
FEMORAL VENOUS CANN RAP (CANNULA) IMPLANT
GAUZE SPONGE 4X4 12PLY STRL (GAUZE/BANDAGES/DRESSINGS) ×4 IMPLANT
GAUZE SPONGE 4X4 12PLY STRL LF (GAUZE/BANDAGES/DRESSINGS) ×6 IMPLANT
GLOVE BIO SURGEON STRL SZ7.5 (GLOVE) ×2 IMPLANT
GLOVE BIO SURGEON STRL SZ8 (GLOVE) ×4 IMPLANT
GLOVE BIOGEL PI IND STRL 6 (GLOVE) IMPLANT
GLOVE BIOGEL PI IND STRL 6.5 (GLOVE) IMPLANT
GLOVE BIOGEL PI IND STRL 8 (GLOVE) IMPLANT
GLOVE BIOGEL PI INDICATOR 6 (GLOVE) ×6
GLOVE BIOGEL PI INDICATOR 6.5 (GLOVE) ×8
GLOVE BIOGEL PI INDICATOR 8 (GLOVE) ×4
GLOVE EUDERMIC 7 POWDERFREE (GLOVE) ×6 IMPLANT
GLOVE ORTHO TXT STRL SZ7.5 (GLOVE) ×4 IMPLANT
GOWN STRL REUS W/ TWL LRG LVL3 (GOWN DISPOSABLE) ×6 IMPLANT
GOWN STRL REUS W/ TWL XL LVL3 (GOWN DISPOSABLE) ×12 IMPLANT
GOWN STRL REUS W/TWL LRG LVL3 (GOWN DISPOSABLE) ×12
GOWN STRL REUS W/TWL XL LVL3 (GOWN DISPOSABLE) ×16
GUIDEWIRE SAFE TJ AMPLATZ EXST (WIRE) ×4 IMPLANT
GUIDEWIRE STRAIGHT .035 260CM (WIRE) ×4 IMPLANT
INSERT FOGARTY 61MM (MISCELLANEOUS) IMPLANT
INSERT FOGARTY SM (MISCELLANEOUS) IMPLANT
INSERT FOGARTY XLG (MISCELLANEOUS) IMPLANT
KIT BASIN OR (CUSTOM PROCEDURE TRAY) ×4 IMPLANT
KIT DILATOR VASC 18G NDL (KITS) IMPLANT
KIT HEART LEFT (KITS) ×4 IMPLANT
KIT SUCTION CATH 14FR (SUCTIONS) ×2 IMPLANT
KIT TURNOVER KIT B (KITS) ×4 IMPLANT
LOOP VESSEL MAXI BLUE (MISCELLANEOUS) ×2 IMPLANT
NDL PERC 18GX7CM (NEEDLE) ×2 IMPLANT
NEEDLE 22X1 1/2 (OR ONLY) (NEEDLE) IMPLANT
NEEDLE PERC 18GX7CM (NEEDLE) ×4 IMPLANT
NS IRRIG 1000ML POUR BTL (IV SOLUTION) ×12 IMPLANT
PACK AORTA (CUSTOM PROCEDURE TRAY) ×2 IMPLANT
PACK ENDOVASCULAR (PACKS) ×2 IMPLANT
PAD ARMBOARD 7.5X6 YLW CONV (MISCELLANEOUS) ×8 IMPLANT
PAD ELECT DEFIB RADIOL ZOLL (MISCELLANEOUS) ×4 IMPLANT
PENCIL BUTTON HOLSTER BLD 10FT (ELECTRODE) ×2 IMPLANT
PERCLOSE PROGLIDE 6F (VASCULAR PRODUCTS)
SET MICROPUNCTURE 5F STIFF (MISCELLANEOUS) ×2 IMPLANT
SHEATH AVANTI 11CM 8FR (SHEATH) ×2 IMPLANT
SHEATH PINNACLE 6F 10CM (SHEATH) ×8 IMPLANT
SLEEVE REPOSITIONING LENGTH 30 (MISCELLANEOUS) ×4 IMPLANT
SPONGE LAP 4X18 X RAY DECT (DISPOSABLE) ×2 IMPLANT
STOPCOCK MORSE 400PSI 3WAY (MISCELLANEOUS) ×16 IMPLANT
SUT ETHIBOND X763 2 0 SH 1 (SUTURE) ×2 IMPLANT
SUT GORETEX CV 4 TH 22 36 (SUTURE) ×4 IMPLANT
SUT GORETEX CV4 TH-18 (SUTURE) ×10 IMPLANT
SUT GORETEX TH-18 36 INCH (SUTURE) ×4 IMPLANT
SUT MNCRL AB 3-0 PS2 18 (SUTURE) ×2 IMPLANT
SUT PROLENE 3 0 SH1 36 (SUTURE) IMPLANT
SUT PROLENE 4 0 RB 1 (SUTURE)
SUT PROLENE 4-0 RB1 .5 CRCL 36 (SUTURE) ×2 IMPLANT
SUT PROLENE 5 0 C 1 36 (SUTURE) ×6 IMPLANT
SUT PROLENE 6 0 C 1 30 (SUTURE) ×4 IMPLANT
SUT SILK  1 MH (SUTURE) ×2
SUT SILK 1 MH (SUTURE) ×2 IMPLANT
SUT SILK 2 0 SH CR/8 (SUTURE) IMPLANT
SUT VIC AB 2-0 CT1 27 (SUTURE) ×4
SUT VIC AB 2-0 CT1 TAPERPNT 27 (SUTURE) ×2 IMPLANT
SUT VIC AB 2-0 CTX 36 (SUTURE) IMPLANT
SUT VIC AB 3-0 SH 8-18 (SUTURE) ×4 IMPLANT
SUT VIC AB 3-0 X1 27 (SUTURE) ×2 IMPLANT
SYR 10ML LL (SYRINGE) ×6 IMPLANT
SYR 30ML LL (SYRINGE) ×4 IMPLANT
SYR 50ML LL SCALE MARK (SYRINGE) ×2 IMPLANT
SYR CONTROL 10ML LL (SYRINGE) IMPLANT
TAPE CLOTH SURG 4X10 WHT LF (GAUZE/BANDAGES/DRESSINGS) ×2 IMPLANT
TOWEL GREEN STERILE (TOWEL DISPOSABLE) ×2 IMPLANT
TOWEL GREEN STERILE FF (TOWEL DISPOSABLE) ×6 IMPLANT
TRANSDUCER W/STOPCOCK (MISCELLANEOUS) ×8 IMPLANT
TRAY FOLEY SILVER 16FR TEMP (SET/KITS/TRAYS/PACK) ×2 IMPLANT
TUBING HIGH PRESSURE 120CM (CONNECTOR) ×4 IMPLANT
VALVE HRT TRANSCATH CERT 26MM (Valve) ×2 IMPLANT
WIRE AMPLATZ SS-J .035X180CM (WIRE) ×4 IMPLANT
WIRE HI TORQ VERSACORE J 260CM (WIRE) ×2 IMPLANT
WIRE MINI STICK MAX (SHEATH) IMPLANT
YANKAUER SUCT BULB TIP NO VENT (SUCTIONS) ×2 IMPLANT

## 2017-09-17 NOTE — Anesthesia Procedure Notes (Addendum)
Procedure Name: Intubation Date/Time: 09/17/2017 1:35 PM Performed by: Leonor Liv, CRNA Pre-anesthesia Checklist: Patient identified, Emergency Drugs available, Suction available and Patient being monitored Patient Re-evaluated:Patient Re-evaluated prior to induction Oxygen Delivery Method: Circle System Utilized Preoxygenation: Pre-oxygenation with 100% oxygen Induction Type: IV induction Ventilation: Mask ventilation without difficulty Laryngoscope Size: Mac and 3 Grade View: Grade I Tube type: Oral Tube size: 8.0 mm Number of attempts: 1 Airway Equipment and Method: Stylet and Oral airway Placement Confirmation: ETT inserted through vocal cords under direct vision,  positive ETCO2 and breath sounds checked- equal and bilateral Secured at: 23 cm Tube secured with: Tape Dental Injury: Teeth and Oropharynx as per pre-operative assessment  Comments: Intubation performed by Jens Som under direct supervision of MDA and CRNA

## 2017-09-17 NOTE — Op Note (Signed)
[facility]   MULTIDISCIPLINARY HEART VALVE TEAM   TAVR OPERATIVE NOTE   Date of Procedure:  09/17/2017  Preoperative Diagnosis: Severe Aortic Stenosis   Postoperative Diagnosis: Same   Procedure:    Transcatheter Aortic Valve Replacement - Left axillary artery approach  Edwards Sapien 3 THV (size 26 mm, model # 9600TFX, serial # [id number])  Co-Surgeons: Gaye Pollack, MD  and Sherren Mocha, MD   Anesthesiologist:  Tamela Gammon, MD  Echocardiographer:  Ena Dawley, MD  Pre-operative Echo Findings:  Severe aortic stenosis  Normal left ventricular systolic function  Post-operative Echo Findings:  No paravalvular leak  Normal left ventricular systolic function   BRIEF CLINICAL NOTE AND INDICATIONS FOR SURGERY  This 84 year old gentleman has stage D, severe, symptomatic aortic stenosis and moderate mitral stenosis with New York Heart Association class III symptoms of exertional fatigue and shortness of breath consistent with chronic diastolic congestive heart failure. I have personally reviewed his echocardiogram, cardiac catheterization, and CTA studies. His echocardiogram shows a trileaflet aortic valve with severely thickened and calcified leaflets with restricted mobility. The mean transvalvular gradient is 51 mmHg consistent with severe aortic stenosis. The mitral valve has thickened and severely calcified leaflets with a mean gradient of 8 mmHg and a peak gradient of 21 mmHg consistent with moderate mitral stenosis. Left ventricular systolic function is preserved with an ejection fraction of 55-60% and moderate left ventricular hypertrophy. Cardiac catheterization shows patent bypass grafts to the LAD and left circumflex territories. There is moderate to severe pulmonary hypertension. His operative risk for open surgical redo sternotomy with aortic valve replacement and mitral valve replacement would be prohibitive given his advanced age and comorbid  conditions. I think transcatheter aortic valve replacement would be the best option for him and hopefully will improve his symptoms despite having moderate mitral stenosis. He is gated cardiac CT shows anatomy suitable for transcatheter aortic valve replacement with no complicating features. His abdominal and pelvic CTA shows diffuse calcific atherosclerosis of the aortoiliac and femoral vessels and I do not think there is adequate access for transfemoral insertion. The left axillary artery or left carotid artery appear suitable for access or trans-apical would be an option. His carotid Dopplers in 03/2017 did show a 40-59% left internal carotid artery stenosis. Repeat studies are pending. His PFTs show severe obstructive disease as well as a severe diffusion capacity deficit which may increase his risk with a transapical insertion.   He also has a 16 x 9 x 15 mm focus of thickening and enhancement along the lesser curvature of the stomach which is suspicious for a potential ulcerated mass. The patient currently has no GI symptoms but does have a history of a duodenal ulcer in the distant past which he thinks was in the 1990s that was treated with medication. This area in the stomach could certainly be an ulcer or a malignancy. He will require EGD to evaluate this further but given the absence of symptoms and the severity of his aortic stenosis I think it would be appropriate to proceed with transcatheter aortic valve replacement and work this up after he has recovered. I reviewed the CT images of this with the patient and his wife and answered their questions and they are in agreement with having a GI workup done afterwards.   The patient and his wife were counseled at length regarding treatment alternatives for management of severe symptomatic aortic stenosis. The risks and benefits of surgical intervention has been discussed in detail. Long-term  prognosis with medical therapy was discussed. Alternative  approaches such as conventional surgical aortic valve replacement, transcatheter aortic valve replacement, and palliative medical therapy were compared and contrasted at length. This discussion was placed in the context of the patient's own specific clinical presentation and past medical history. All of their questions have been addressed.   A discussion was held regarding what types of management strategies would be attempted intraoperatively in the event of life-threatening complications, including whether or not the patient would be considered a candidate for the use of cardiopulmonary bypass and/or conversion to open sternotomy for attempted surgical intervention. The patient has been advised of a variety of complications that might develop including but not limited to risks of death, stroke, paravalvular leak, aortic dissection or other major vascular complications, aortic annulus rupture, device embolization, cardiac rupture or perforation, mitral regurgitation, acute myocardial infarction, arrhythmia, heart block or bradycardia requiring permanent pacemaker placement, congestive heart failure, respiratory failure, renal failure, pneumonia, infection, other late complications related to structural valve deterioration or migration, or other complications that might ultimately cause a temporary or permanent loss of functional independence or other long term morbidity. The patient provides full informed consent for the procedure as described and all questions were answered.    DETAILS OF THE OPERATIVE PROCEDURE  PREPARATION:    The patient is brought to the operating room on the above mentioned date and central monitoring was established by the anesthesia team including placement of a central venous line and radial arterial line. The patient is placed in the supine position on the operating table.  Intravenous antibiotics are administered.   General endotracheal anesthesia is induced uneventfully.  A  Foley catheter is placed.  Baseline transesophageal  echocardiogram was performed. The patient's chest, abdomen, both groins, and both lower extremities are prepared and draped in a sterile manner. A time out procedure is performed.   PERIPHERAL ACCESS:    Using the modified Seldinger technique, femoral arterial and venous access was obtained with placement of 6 Fr sheaths with arterial access on the right and venous access on the left side.  A pigtail diagnostic catheter was passed through the right arterial sheath under fluoroscopic guidance into the aortic root.  A temporary transvenous pacemaker catheter was passed through the left femoral venous sheath under fluoroscopic guidance into the right ventricle.  The pacemaker was tested to ensure stable lead placement and pacemaker capture. Aortic root angiography was performed in order to determine the optimal angiographic angle for valve deployment.  Left Axillary Artery Exposure:  A transverse incision was made below the left clavicle. The pectoralis major muscle was split along its fibers and the pectoralis minor muscle was retracted laterally. The brachial plexus was identified and gently retracted laterally to expose the axillary artery.  The patient was  heparinized and ACT verified greater than 250 seconds. A double pursestring suture of CV-4 Gortex with tiny Gortex pledgets was placed in the axillary artery.   BALLOON AORTIC VALVULOPLASTY:   Not performed    TRANSCATHETER HEART VALVE DEPLOYMENT:   A 6 Fr sheath was inserted in the left axillary artery. An AL-2 catheter was used to cross the aortic valve and was then changed out for a 6 Fr pigtail catheter. Simultaneous LV and Ao pressures are recorded. An Amplatz extra Stiff wire was inserted into the LV apex. An 18 Fr Certitude sheath is then advanced through the left axillary artery into the ascending aorta without difficulty.   An Edwards Sapien 3 transcatheter heart valve (  size 26  mm, model #9600TFX, serial #[id number]) was prepared and crimped per manufacturer's guidelines, and the proper orientation of the valve is confirmed on the [place] Certitude delivery system. The valve and delivery system were then inserted into the sheath and advanced using the normal technique without difficulty.  Once final position of the valve has been confirmed by angiographic assessment, the valve is deployed while temporarily holding ventilation and during rapid ventricular pacing to maintain systolic blood pressure < 50 mmHg and pulse pressure < 10 mmHg. The balloon inflation is held for >3 seconds after reaching full deployment volume. Once the balloon has fully deflated the balloon is retracted into the ascending aorta and valve function is assessed using echocardiography. There is felt to be no paravalvular leak and no central aortic insufficiency.  The patient's hemodynamic recovery following valve deployment is good.  The deployment balloon and guidewire are both removed.    PROCEDURE COMPLETION:   The sheath was removed and the pursestring sutures tied. There was complete hemostasis and a good pulse distal to the repair.  Protamine was administered once axillary arterial closure was complete. The temporary pacemaker, pigtail catheters and femoral sheaths were removed with manual pressure used for hemostasis.   The patient tolerated the procedure well and is transported to the surgical intensive care in stable condition. There were no immediate intraoperative complications. All sponge instrument and needle counts are verified correct at completion of the operation.   No blood products were administered during the operation.  The patient received a total of 61.2 mL of intravenous contrast during the procedure.   Gaye Pollack, MD 09/17/2017 5:23 PM

## 2017-09-17 NOTE — Progress Notes (Signed)
  Echocardiogram Echocardiogram Transesophageal has been performed.  [name] 09/17/2017, 3:26 PM

## 2017-09-17 NOTE — Interval H&P Note (Signed)
History and Physical Interval Note:  09/17/2017 1:02 PM  [name]  has presented today for surgery, with the diagnosis of Severe Aortic Stenosis  The various methods of treatment have been discussed with the patient and family. After consideration of risks, benefits and other options for treatment, the patient has consented to  Procedure(s): TRANSCATHETER AORTIC VALVE REPLACEMENT, LEFT SUBCLAVIAN (Left) TRANSESOPHAGEAL ECHOCARDIOGRAM (TEE) (N/A) as a surgical intervention .  The patient's history has been reviewed, patient examined, no change in status, stable for surgery.  I have reviewed the patient's chart and labs.  Questions were answered to the patient's satisfaction.     Gaye Pollack

## 2017-09-17 NOTE — Anesthesia Procedure Notes (Signed)
Central Venous Catheter Insertion Performed by: Duane Boston, MD, anesthesiologist Start/End4/16/2019 11:24 AM, 09/17/2017 11:34 AM Patient location: Pre-op. Preanesthetic checklist: patient identified, IV checked, site marked, risks and benefits discussed, surgical consent, monitors and equipment checked, pre-op evaluation, timeout performed and anesthesia consent Position: Trendelenburg Lidocaine 1% used for infiltration and patient sedated Hand hygiene performed , maximum sterile barriers used  and Seldinger technique used Catheter size: 8 Fr Total catheter length 16. Central line was placed.Double lumen Procedure performed using ultrasound guided technique. Ultrasound Notes:image(s) printed for medical record Attempts: 1 Following insertion, dressing applied, line sutured and Biopatch. Post procedure assessment: blood return through all ports, free fluid flow and no air  Patient tolerated the procedure well with no immediate complications.

## 2017-09-17 NOTE — Anesthesia Procedure Notes (Signed)
Arterial Line Insertion Start/End4/16/2019 12:22 PM, 09/17/2017 12:32 PM Performed by: Duane Boston, MD, anesthesiologist  Patient location: Pre-op. Preanesthetic checklist: patient identified, IV checked, site marked, risks and benefits discussed, surgical consent, monitors and equipment checked, pre-op evaluation, timeout performed and anesthesia consent Lidocaine 1% used for infiltration Right, radial was placed Catheter size: 20 Fr Hand hygiene performed  and maximum sterile barriers used   Attempts: 1 Procedure performed without using ultrasound guided technique. Following insertion, dressing applied and Biopatch. Post procedure assessment: normal and unchanged  Patient tolerated the procedure well with no immediate complications.

## 2017-09-17 NOTE — Transfer of Care (Signed)
Immediate Anesthesia Transfer of Care Note  Patient: [name]  Procedure(s) Performed: TRANSCATHETER AORTIC VALVE REPLACEMENT, LEFT SUBCLAVIAN using an Edwards 26mm Sapien 3 Aortic Valve (Left ) TRANSESOPHAGEAL ECHOCARDIOGRAM (TEE) (N/A )  Patient Location: SICU  Anesthesia Type:General  Level of Consciousness: awake, alert  and oriented  Airway & Oxygen Therapy: Patient Spontanous Breathing and Patient connected to face mask oxygen  Post-op Assessment: Report given to RN, Post -op Vital signs reviewed and stable and Patient moving all extremities  Post vital signs: Reviewed and stable  Last Vitals:  Vitals Value Taken Time  BP    Temp    Pulse 84 09/17/2017  4:32 PM  Resp 16 09/17/2017  4:32 PM  SpO2 97 % 09/17/2017  4:32 PM  Vitals shown include unvalidated device data.  Last Pain:  Vitals:   09/17/17 1052  TempSrc:   PainSc: 4       Patients Stated Pain Goal: 3 (58/59/29 2446)  Complications: No apparent anesthesia complications

## 2017-09-17 NOTE — Anesthesia Preprocedure Evaluation (Addendum)
Anesthesia Evaluation  Patient identified by MRN, date of birth, ID band Patient awake    Reviewed: Allergy & Precautions, H&P , NPO status , Patient's Chart, lab work & pertinent test results, reviewed documented beta blocker date and time   History of Anesthesia Complications Negative for: history of anesthetic complications  Airway Mallampati: I  TM Distance: >3 FB     Dental  (+) Edentulous Upper, Partial Lower, Dental Advisory Given   Pulmonary sleep apnea and Oxygen sleep apnea , COPD, former smoker,    Pulmonary exam normal        Cardiovascular hypertension, + CAD, + Past MI, + CABG and + Peripheral Vascular Disease  + dysrhythmias Atrial Fibrillation + Valvular Problems/Murmurs AS  Rhythm:Irregular Rate:Normal + Systolic murmurs 1. Severe multivessel CAD with chronic total occlusion of the RCA, LCx, and LAD 2. S/p CABG with continued patency of the sequential SVG-OM1 and OM2 and LIMA-LAD 3. Moderate stenosis of the apical LAD at the site of previous angioplasty 4. Severe aortic stenosis with calculated AVA 0.89 square cm 5. Severe pulmonary HTN likely secondary to diastolic heart failure/left heart disease 6. Aortic root angiography demonstrates a valve deployment angle of LAO23, CRA12  ------------------------------------------------------------------- Study Conclusions  - Left ventricle: The cavity size was severely dilated. Wall   thickness was increased in a pattern of moderate LVH. Systolic   function was normal. The estimated ejection fraction was in the   range of 55% to 60%.   Neuro/Psych Anxiety Depression negative neurological ROS     GI/Hepatic GERD  ,  Endo/Other  diabetes, Type 2obese  Renal/GU      Musculoskeletal  (+) Arthritis ,   Abdominal   Peds  Hematology   Anesthesia Other Findings   Reproductive/Obstetrics                            Anesthesia  Physical  Anesthesia Plan  ASA: IV  Anesthesia Plan: MAC   Post-op Pain Management:    Induction: Intravenous  PONV Risk Score and Plan: 1 and Ondansetron and Propofol infusion  Airway Management Planned: Natural Airway  Additional Equipment: Arterial line and CVP  Intra-op Plan:   Post-operative Plan: Extubation in OR  Informed Consent: I have reviewed the patients History and Physical, chart, labs and discussed the procedure including the risks, benefits and alternatives for the proposed anesthesia with the patient or authorized representative who has indicated his/her understanding and acceptance.   Dental advisory given  Plan Discussed with: CRNA, Surgeon and Anesthesiologist  Anesthesia Plan Comments:        Anesthesia Quick Evaluation

## 2017-09-17 NOTE — Anesthesia Postprocedure Evaluation (Signed)
Anesthesia Post Note  Patient: [name]  Procedure(s) Performed: TRANSCATHETER AORTIC VALVE REPLACEMENT, LEFT SUBCLAVIAN using an Edwards 27m Sapien 3 Aortic Valve (Left ) TRANSESOPHAGEAL ECHOCARDIOGRAM (TEE) (N/A )     Patient location during evaluation: SICU Anesthesia Type: MAC Level of consciousness: awake and alert Pain management: pain level controlled Vital Signs Assessment: post-procedure vital signs reviewed and stable Respiratory status: spontaneous breathing and respiratory function stable Cardiovascular status: stable Postop Assessment: no apparent nausea or vomiting Anesthetic complications: no    Last Vitals:  Vitals:   09/17/17 1715 09/17/17 1730  BP: (!) 144/68 139/65  Pulse: 88 90  Resp: 20 (!) 21  Temp:    SpO2: 93% 94%    Last Pain:  Vitals:   09/17/17 1645  TempSrc: Oral  PainSc: 10-Worst pain ever                 Oracio Galen DANIEL

## 2017-09-17 NOTE — Progress Notes (Signed)
[facility] BRIEF SICU PROGRESS NOTE  Day of Surgery  S/P Procedure(s) (LRB): TRANSCATHETER AORTIC VALVE REPLACEMENT, LEFT SUBCLAVIAN using an Edwards 14mm Sapien 3 Aortic Valve (Left) TRANSESOPHAGEAL ECHOCARDIOGRAM (TEE) (N/A)   Doing well.  Mild soreness left shoulder Afib w/ stable HR and BP Breathing comfortably w/ O2 sats 95%  Plan: Continue routine post TAVR  Rexene Alberts, MD 09/17/2017 8:32 PM

## 2017-09-17 NOTE — Op Note (Signed)
[facility]   MULTIDISCIPLINARY HEART VALVE TEAM   TAVR OPERATIVE NOTE   Date of Procedure:  09/17/2017  Preoperative Diagnosis: Severe Aortic Stenosis   Postoperative Diagnosis: Same   Procedure:   Transcatheter Aortic Valve Replacement - Left Subclavian Approach Edwards Sapien 3 THV (size 26 mm, model # 9600TFX, serial # [id number])   Co-Surgeons:  Gaye Pollack, MD and Sherren Mocha, MD  Anesthesiologist:  Dr Tobias Alexander  Echocardiographer:  Dr Meda Coffee  Pre-operative Echo Findings:  Severe aortic stenosis  Normal left ventricular systolic function  Post-operative Echo Findings:  No paravalvular leak  Normal left ventricular systolic function  BRIEF CLINICAL NOTE AND INDICATIONS FOR SURGERY  84 year old gentleman with coronary disease and previous CABG, permanent atrial fibrillation on long-term anticoagulation, cerebrovascular disease with history of right carotid endarterectomy, renovascular disease, COPD on home oxygen, type 2 diabetes, and progressive now severe, stage D, symptomatic aortic stenosis.  He presents today for elective transcatheter aortic valve replacement after undergoing extensive evaluation by the multidisciplinary heart valve team.  The patient does not have adequate iliofemoral vessels for transcatheter access and he presents for planned left subclavian artery access.  During the course of the patient's preoperative work up they have been evaluated comprehensively by a multidisciplinary team of specialists coordinated through the [facility] in the [facility].  They have been demonstrated to suffer from symptomatic severe aortic stenosis as noted above. The patient has been counseled extensively as to the relative risks and benefits of all options for the treatment of severe aortic stenosis including long term medical therapy, conventional surgery for aortic valve replacement, and transcatheter  aortic valve replacement.  The patient has been independently evaluated by two cardiac surgeons including Dr. Roxy Manns and Dr. Cyndia Bent, and they are felt to be at high risk for conventional surgical aortic valve replacement. Both surgeons indicated the patient would be a poor candidate for conventional surgery.   Based upon review of all of the patient's preoperative diagnostic tests they are felt to be candidate for transcatheter aortic valve replacement using an open subclavian approach as an alternative to high risk conventional surgery.   Following the decision to proceed with transcatheter aortic valve replacement, a discussion has been held regarding what types of management strategies would be attempted intraoperatively in the event of life-threatening complications, including whether or not the patient would be considered a candidate for the use of cardiopulmonary bypass and/or conversion to open sternotomy for attempted surgical intervention.  The patient has been advised of a variety of complications that might develop peculiar to this approach including but not limited to risks of death, stroke, paravalvular leak, aortic dissection or other major vascular complications, aortic annulus rupture, device embolization, cardiac rupture or perforation, acute myocardial infarction, arrhythmia, heart block or bradycardia requiring permanent pacemaker placement, congestive heart failure, respiratory failure, renal failure, pneumonia, infection, other late complications related to structural valve deterioration or migration, or other complications that might ultimately cause a temporary or permanent loss of functional independence or other long term morbidity.  The patient provides full informed consent for the procedure as described and all questions were answered preoperatively.  DETAILS OF THE OPERATIVE PROCEDURE  PREPARATION:   The patient is brought to the operating room on the above mentioned date and central  monitoring was established by the anesthesia team including placement of a central venous catheter and radial arterial line. The patient is placed in the supine position on the operating  table.  Intravenous antibiotics are administered.  General endotracheal anesthesia is induced uneventfully.  A Foley catheter is placed.  Baseline transesophageal echocardiogram is performed. The patient's chest, abdomen, both groins, and both lower extremities are prepared and draped in a sterile manner. A time out procedure is performed.   PERIPHERAL ACCESS:   Using ultrasound guidance, femoral arterial and venous access is obtained with placement of 6 Fr sheaths on the right and left side.  A pigtail diagnostic catheter was passed through the right femoral arterial sheath under fluoroscopic guidance into the aortic root.  A temporary transvenous pacemaker catheter was passed through the left femoral venous sheath under fluoroscopic guidance into the right ventricle.  The pacemaker was tested to ensure stable lead placement and pacemaker capture. Aortic root angiography was performed in order to determine the optimal angiographic angle for valve deployment.  SUBCLAVIAN ACCESS:  Please see the operative report of Dr Cyndia Bent for details  BALLOON AORTIC VALVULOPLASTY:  Not performed  TRANSCATHETER HEART VALVE DEPLOYMENT:  A 6 Fr sheath is inserted in the left subclavian artery. An AL-2 catheter is used to cross the aortic valve and is then changed out for a 6 Fr pigtail catheter. Simultaneous LV and Ao pressures are recorded. An Amplatz extra Stiff wire is inserted into the LV apex. An 18 Fr Certitude sheath is then advanced through the left subclavian artery into the ascending aorta without resistance. An Edwards Sapien 3 transcatheter heart valve (size 26 mm, model #9600TFX, serial [id number]) was prepared and crimped per manufacturer's guidelines, and the proper orientation of the valve is confirmed on the [place]  Certitude delivery system. The valve was advanced through the introducer sheath using normal technique until in an appropriate position across the aortic valve.  Once final position of the valve has been confirmed by angiographic assessment, the valve is deployed while temporarily holding ventilation and during rapid ventricular pacing to maintain systolic blood pressure < 50 mmHg and pulse pressure < 10 mmHg. The balloon inflation is held for >3 seconds after reaching full deployment volume. Once the balloon has fully deflated the balloon is retracted into the ascending aorta and valve function is assessed using echocardiography. There is felt to be no paravalvular leak and no central aortic insufficiency.  The patient's hemodynamic recovery following valve deployment is good.  The deployment balloon and guidewire are both removed. Echo demostrated acceptable post-procedural gradients, stable mitral valve function, and no aortic insufficiency.   PROCEDURE COMPLETION:  See the note of Dr Cyndia Bent for details of subclavian artery closure. The temporary pacemaker, pigtail catheters and femoral sheaths were removed with manual pressure used for hemostasis.   The patient tolerated the procedure well and is transported to the surgical intensive care in stable condition. There were no immediate intraoperative complications. All sponge instrument and needle counts are verified correct at completion of the operation.   The patient received a total of 61.2 mL of intravenous contrast during the procedure.  Sherren Mocha, MD 09/17/2017 3:37 PM

## 2017-09-17 NOTE — H&P (Signed)
[address]       [place] [address]             [phone]      Cardiothoracic Surgery Admission History and Physical   Referring Provider is Stanford Breed, Denice Bors, MD  PCP is Nicoletta Dress, MD      Chief Complaint  Patient presents with  . Aortic Stenosis       HPI:   The patient is an 84 year old gentleman with history of hypertension, hyperlipidemia, diabetes, permanent atrial fibrillation on Pradaxa, status post right carotid endarterectomy, status post renal artery stenting, and status post coronary bypass graft surgery x3 by Dr. Servando Snare in 1994. At that time he had a left internal mammary graft to the LAD and a sequential saphenous vein graft to 2 marginal branches. He had a cardiac catheterization in 2015 that showed a severe stenosis of the distal LAD beyond the LIMA insertion site and this was treated with PTCA without complication. He has had a heart murmur for many years and has been followed for moderate aortic stenosis and moderate mitral stenosis. His mean aortic valve gradient one year ago was 46 mmHg. His most recent echo on 07/29/2017 showed a mean aortic valve gradient of 51 mmHg with a peak of 85 mmHg. The peak velocity ratio was 0.2. The mean gradient across the mitral valve was 8 mmHg with a peak of 21 mmHg. Left ventricular ejection fraction was 55-60% with moderate left ventricular hypertrophy. He underwent cardiac catheterization on 08/14/2017 showing severe multivessel coronary disease with chronic total occlusion of all 3 of his native vessels. There was a patent LIMA to the LAD and continued patency of the sequential saphenous vein graft to marginal branches. There is moderate stenosis of the apical LAD at the previous angioplasty site. There is moderate to severe pulmonary hypertension with a PA pressure of 69/27 with a mean wedge pressure of 25 mmHg. The LVEDP was 22 mmHg.  The patient is here today with his wife. He reports progressive  exertional shortness of breath and fatigue particularly with walking up hills or up stairs. He has no symptoms at rest. He can move around his house without symptoms. His wife said that if he goes out to the grocery store he sits in the car. He denies any chest discomfort or pain. He denies any dizziness or syncope. He has had some peripheral edema for a long time and wears compression socks. He has been on oxygen at home for the past 6-7 years and uses it regularly at night but only occasionally during the day. His activity is limited by arthritis involving his knees and right hip.       Past Medical History:  Diagnosis Date  . Acute myocardial infarction, unspecified site, initial episode of care   . Anemia   . Anxiety   . Aortic stenosis   . Atrial fibrillation ([place])    a. permanent, on Coumadin for anticoagulation  . CAD (coronary artery disease)    a. s/p CABG in 1994 b. cath in 2015 showing normal LM, 100% LCx and RCA stenosis with 50-60% prox LAD stenosis and 100% mid-LAD stenosis; patent sequential SVG-OM2-OM3 and patent LIMA-LAD with PTCA of distal LAD via LIMA graft performed at that time  . Carotid stenosis   . CHF (congestive heart failure) ([place])   . COPD (chronic obstructive pulmonary disease) ([place])   . Depression   . Diabetes mellitus without complication ([place])  FASTING 98-120S  . GERD (gastroesophageal reflux disease)   . History of blood transfusion   . History of peptic ulcer disease   . Hyperlipidemia   . Hypertension   . Myocardial infarction ([place])    X2  . Peripheral vascular disease ([place])   . Pneumonia    HX OF  . Renal artery stenosis ([place])   . Sleep apnea    OXYGEN AT NIGHT 2L [place]        Past Surgical History:  Procedure Laterality Date  . AORTIC ARCH ANGIOGRAPHY N/A 08/14/2017   Procedure: AORTIC ARCH ANGIOGRAPHY; Surgeon: Sherren Mocha, MD; Location: [facility]; Service: Cardiovascular; Laterality: N/A;  . CAROTID ENDARTERECTOMY    . CORONARY  ARTERY BYPASS GRAFT  1994  . ENDARTERECTOMY Right 01/05/2013   Procedure: ENDARTERECTOMY CAROTID; Surgeon: Rosetta Posner, MD; Location: [facility]; Service: Vascular; Laterality: Right;  . KNEE SURGERY  08/2003   left knee/ARTHROSCOPIC  . LEFT HEART CATHETERIZATION WITH CORONARY/GRAFT ANGIOGRAM N/A 09/01/2013   Procedure: LEFT HEART CATHETERIZATION WITH Beatrix Fetters; Surgeon: Sinclair Grooms, MD; Location[facility]; Service: Cardiovascular; Laterality: N/A;  . PATCH ANGIOPLASTY Right 01/05/2013   Procedure: PATCH ANGIOPLASTY; Surgeon: Rosetta Posner, MD; Location: [facility]; Service: Vascular; Laterality: Right;  . PERCUTANEOUS CORONARY STENT INTERVENTION (PCI-S)  09/01/2013   Procedure: PERCUTANEOUS CORONARY STENT INTERVENTION (PCI-S); Surgeon: Sinclair Grooms, MD; Location: [facility] CATH LAB; Service: Cardiovascular;;  . removal of bleeding ulcer  1994  . RENAL ARTERY STENT     stenting of the left renal artery as well as a cutting balloon angioplasty for treatment of in-stent restenosis coronary artery bypass grafting in 1994.  [name] RIGHT/LEFT HEART CATH AND CORONARY/GRAFT ANGIOGRAPHY N/A 08/14/2017   Procedure: RIGHT/LEFT HEART CATH AND CORONARY/GRAFT ANGIOGRAPHY; Surgeon: Sherren Mocha, MD; Location: [facility]; Service: Cardiovascular; Laterality: N/A;        Family History  Problem Relation Age of Onset  . Coronary artery disease Mother   . Heart disease Mother    Before age 84  . Hyperlipidemia Mother   . Hypertension Mother   . Heart attack Mother   . Coronary artery disease Father   . Heart disease Father    After age 65  . Heart attack Father   . Heart disease Sister    After age 42  . Heart disease Brother    After age 61  . Coronary artery disease Unknown    13 sibling, almost all have coronary disease and some with premature onset  . Heart disease Unknown    13 sibling, almost all have coronary disease and some with premature onset   Social History          Socioeconomic History  . Marital status: Married    Spouse name: Not on file  . Number of children: Not on file  . Years of education: Not on file  . Highest education level: Not on file  Occupational History  . Not on file  Social Needs  . Financial resource strain: Not on file  . Food insecurity:    Worry: Not on file    Inability: Not on file  . Transportation needs:    Medical: Not on file    Non-medical: Not on file  Tobacco Use  . Smoking status: Former Smoker    Last attempt to quit: 04/27/1993    Years since quitting: 24.3  . Smokeless tobacco: Never Used  Substance and Sexual Activity  .  Alcohol use: No    Alcohol/week: 0.0 oz  . Drug use: No  . Sexual activity: Yes  Lifestyle  . Physical activity:    Days per week: Not on file    Minutes per session: Not on file  . Stress: Not on file  Relationships  . Social connections:    Talks on phone: Not on file    Gets together: Not on file    Attends religious service: Not on file    Active member of club or organization: Not on file    Attends meetings of clubs or organizations: Not on file    Relationship status: Not on file  . Intimate partner violence:    Fear of current or ex partner: Not on file    Emotionally abused: Not on file    Physically abused: Not on file    Forced sexual activity: Not on file  Other Topics Concern  . Not on file  Social History Narrative   Social History:   Married    Tobacco Use - No.    Alcohol Use - yes         Family History:   Mother died at age 16 of coronary disease. Father died at 99 of coronary disease. He has 13 siblings, almost all of whom have coronary disease and some with premature onset.         Current Outpatient Medications  Medication Sig Dispense Refill  . acetaminophen (TYLENOL) 500 MG tablet Take 1,000 mg by mouth every 6 (six) hours as needed for moderate pain or headache.     [name] acyclovir (ZOVIRAX) 800 MG tablet Take 400 mg by mouth 2 (two) times  daily.     [name] albuterol (PROVENTIL,VENTOLIN) 90 MCG/ACT inhaler Inhale 2 puffs into the lungs every 4 (four) hours as needed for shortness of breath.     [name] atorvastatin (LIPITOR) 80 MG tablet Take 1 tablet (80 mg total) by mouth at bedtime. 90 tablet 3  . brimonidine (ALPHAGAN) 0.2 % ophthalmic solution Place 1 drop into both eyes 2 (two) times daily.    . Cholecalciferol (VITAMIN D) 2000 UNITS CAPS Take 2,000 Units by mouth daily.    . dabigatran (PRADAXA) 150 MG CAPS Take 150 mg by mouth 2 (two) times daily.     . ferrous sulfate 325 (65 FE) MG tablet Take 325 mg by mouth daily with breakfast.     . furosemide (LASIX) 20 MG tablet Take 2 tablets (40 mg total) by mouth daily. 30 tablet 5  . ipratropium-albuterol (DUONEB) 0.5-2.5 (3) MG/3ML SOLN Take 3 mLs by nebulization daily.     . lansoprazole (PREVACID) 30 MG capsule Take 30 mg by mouth daily.     . metFORMIN (GLUCOPHAGE) 500 MG tablet Take 500 mg by mouth daily with breakfast.    . metoprolol (LOPRESSOR) 50 MG tablet Take 1 tablet (50 mg total) by mouth 2 (two) times daily. 180 tablet 4  . Multiple Vitamin (MULTIVITAMIN WITH MINERALS) TABS Take 1 tablet by mouth daily.    . multivitamin-lutein (OCUVITE-LUTEIN) CAPS Take 1 capsule by mouth daily.    . predniSONE (DELTASONE) 20 MG tablet Take 20-60 mg by mouth daily as needed (for gout attack).     . terazosin (HYTRIN) 5 MG capsule Take 5 mg by mouth at bedtime.     . trolamine salicylate (ASPERCREME) 10 % cream Apply 1 application topically 4 (four) times daily as needed for muscle pain.      No  current facility-administered medications for this visit.         Allergies  Allergen Reactions  . Bactrim [Sulfamethoxazole-Trimethoprim] Other (See Comments)    "does not do anything for me"  . Levaquin [Levofloxacin In D5w] Diarrhea   Review of Systems:   General: normal appetite, decreased energy, no weight gain, no weight loss, no fever  Cardiac: no chest pain with exertion, no chest  pain at rest, + SOB with mild exertion, no resting SOB, no PND, no orthopnea, + palpitations, + arrhythmia, + atrial fibrillation, + LE edema, no dizzy spells, no syncope  Respiratory: exertional shortness of breath, + home oxygen, + productive cough, no dry cough, no bronchitis, no wheezing, no hemoptysis, no asthma, no pain with inspiration or cough, + sleep apnea, no CPAP at night  GI: no difficulty swallowing, no reflux, no frequent heartburn, no hiatal hernia, no abdominal pain, no constipation, no diarrhea, no hematochezia, no hematemesis, no melena  GU: no dysuria, + frequency, no urinary tract infection, no hematuria, no enlarged prostate, no kidney stones, no kidney disease  Vascular: no pain suggestive of claudication, no pain in feet, no leg cramps, no varicose veins, no DVT, no non-healing foot ulcer  Neuro: no stroke, no TIA's, no seizures, no headaches, no temporary blindness one eye, no slurred speech, no peripheral neuropathy, no chronic pain, + instability of gait, no memory/cognitive dysfunction  Musculoskeletal: + arthritis, no joint swelling, no myalgias, + difficulty walking, reduced mobility  Skin: no rash, no itching, no skin infections, no pressure sores or ulcerations  Psych: no anxiety, no depression, no nervousness, no unusual recent stress  Eyes: no blurry vision, + floaters, + recent vision changes, + wears glasses or contacts  ENT: no hearing loss, no loose or painful teeth, wears dentures, last saw dentist Jan 2018  Hematologic: + easy bruising, no abnormal bleeding, no clotting disorder, no frequent epistaxis  Endocrine: + diabetes, does check CBG's at home    Physical Exam:   BP 118/61  Pulse 68  Resp 20  Ht 5\' 11"  (1.803 m)  Wt 222 lb (100.7 kg)  SpO2 95% Comment: RA  BMI 30.96 kg/m  General: Elderly but well-appearing, in no distress.  HEENT: Unremarkable, NCAT, PERLA, EOMI, oropharynx clear  Neck: no JVD, no bruits, no adenopathy or thyromegaly  Chest:  clear to auscultation, symmetrical breath sounds, no wheezes, no rhonchi  CV: RRR, grade III/VI crescendo/decrescendo murmur heard best at RSB, no diastolic murmur  Abdomen: soft, non-tender, no masses or organomegaly  Extremities: warm, well-perfused, pulses not palpable in feet, mild LE edema  Rectal/GU Deferred  Neuro: Grossly non-focal and symmetrical throughout  Skin: Clean and dry, no rashes, no breakdown    Diagnostic Tests:   Result status: Edited Result - FINAL  [facility]  [address], [place] [address]  [phone]  -------------------------------------------------------------------  Transthoracic Echocardiography  (Report amended )  Patient: Trino, Higinbotham  MR #: [id number]  Study Date: 07/29/2017  Gender: M  Age: 63  Height: 182.9 cm  Weight: 100.3 kg  BSA: 2.28 m^2  Pt. Status:  Room:  ATTENDING Kirk Ruths  [facility]  REFERRING Kirk Ruths  SONOGRAPHER Marygrace Drought, RCS  PERFORMING [facility], Outpatient  cc:  -------------------------------------------------------------------  LV EF: 55% - 60%  -------------------------------------------------------------------  Indications: Aortic Valve Disorder (I35.0).  -------------------------------------------------------------------  History: PMH: PVD. Atrial fibrillation. Congestive heart  failure. Chronic obstructive pulmonary disease. PMH: Myocardial  infarction.  -------------------------------------------------------------------  Study Conclusions  -  Left ventricle: The cavity size was severely dilated. Wall  thickness was increased in a pattern of moderate LVH. Systolic  function was normal. The estimated ejection fraction was in the  range of 55% to 60%.  - Aortic valve: Moderately calcified annulus. Severely thickened,  severely calcified leaflets. There was severe stenosis. There was  mild regurgitation. Valve area (VTI): 0.56 cm^2. Valve area  (Vmax): 0.63  cm^2. Valve area (Vmean): 0.57 cm^2.  - Mitral valve: Mildly to moderately calcified annulus. Mildly  thickened, severely calcified leaflets . The findings are  consistent with moderate stenosis. Valve area by pressure  half-time: 1.69 cm^2. Valve area by continuity equation (using  LVOT flow): 1 cm^2.  - Left atrium: The atrium was moderately dilated.  - Right ventricle: Systolic function was mildly reduced.  - Right atrium: The atrium was mildly dilated.  - Tricuspid valve: There was mild-moderate regurgitation.  -------------------------------------------------------------------  Labs, prior tests, procedures, and surgery:  Coronary artery bypass grafting.  -------------------------------------------------------------------  Study data: Comparison was made to the study of 09/11/2016. Study  status: Routine. Procedure: The patient reported no pain pre or  post test. Transthoracic echocardiography. Image quality was  adequate. Transthoracic echocardiography. M-mode,  complete 2D, spectral Doppler, and color Doppler. Birthdate:  Patient birthdate: [birth year]. Age: Patient is 84 yr old. Sex:  Gender: male. BMI: 30 kg/m^2. Blood pressure: 130/60  Patient status: Outpatient. Study date: Study date: 07/29/2017.  Study time: 02:10 PM. Location: [facility]  -------------------------------------------------------------------  -------------------------------------------------------------------  Left ventricle: The cavity size was severely dilated. Wall  thickness was increased in a pattern of moderate LVH. Systolic  function was normal. The estimated ejection fraction was in the  range of 55% to 60%. The study was not technically sufficient to  allow evaluation of LV diastolic dysfunction due to atrial  fibrillation.  -------------------------------------------------------------------  Aortic valve: Moderately calcified annulus. Severely thickened,  severely calcified leaflets.  Doppler: There was severe stenosis.  There was mild regurgitation. VTI ratio of LVOT to aortic  valve: 0.18. Valve area (VTI): 0.56 cm^2. Indexed valve area (VTI):  0.25 cm^2/m^2. Peak velocity ratio of LVOT to aortic valve: 0.2.  Valve area (Vmax): 0.63 cm^2. Indexed valve area (Vmax): 0.27  cm^2/m^2. Mean velocity ratio of LVOT to aortic valve: 0.18. Valve  area (Vmean): 0.57 cm^2. Indexed valve area (Vmean): 0.25 cm^2/m^2.  Mean gradient (S): 51 mm Hg. Peak gradient (S): 85 mm Hg.  -------------------------------------------------------------------  Aorta: Aortic root: The aortic root was normal in size.  Ascending aorta: The ascending aorta was normal in size.  -------------------------------------------------------------------  Mitral valve: Mildly to moderately calcified annulus. Mildly  thickened, severely calcified leaflets . Doppler: The findings  are consistent with moderate stenosis. There was trivial  regurgitation. Valve area by pressure half-time: 1.69 cm^2.  Indexed valve area by pressure half-time: 0.74 cm^2/m^2. Valve area  by continuity equation (using LVOT flow): 1 cm^2. Indexed valve  area by continuity equation (using LVOT flow): 0.44 cm^2/m^2.  Mean gradient (D): 8 mm Hg. Peak gradient (D): 21 mm Hg.  -------------------------------------------------------------------  Left atrium: The atrium was moderately dilated.  -------------------------------------------------------------------  Right ventricle: The cavity size was normal. Systolic function was  mildly reduced.  -------------------------------------------------------------------  Pulmonic valve: The valve appears to be grossly normal.  Doppler: There was no significant regurgitation.  -------------------------------------------------------------------  Tricuspid valve: The valve appears to be grossly normal.  Doppler: There was mild-moderate regurgitation.    -------------------------------------------------------------------  Pulmonary artery: Systolic pressure was within the normal range.  -------------------------------------------------------------------  Right atrium: The atrium was mildly dilated.  -------------------------------------------------------------------  Pericardium: There was no pericardial effusion.  -------------------------------------------------------------------  Systemic veins:  Inferior vena cava: The vessel was dilated. The respirophasic  diameter changes were blunted (< 50%), consistent with elevated  central venous pressure. Diameter: 25 mm.  -------------------------------------------------------------------  Measurements  IVC Value Reference  ID 25 mm ----------  Left ventricle Value Reference  LV ID, ED, PLAX chordal (H) 52.6 mm 43 - 52  LV ID, ES, PLAX chordal 35.7 mm 23 - 38  LV fx shortening, PLAX chordal 32 % >=29  LV PW thickness, ED 15.1 mm ----------  IVS/LV PW ratio, ED 0.79 <=1.3  Stroke volume, 2D 67 ml ----------  Stroke volume/bsa, 2D 29 ml/m^2 ----------  LV e&', lateral 9.25 cm/s ----------  LV E/e&', lateral 24.76 ----------  LV e&', medial 7.61 cm/s ----------  LV E/e&', medial 30.09 ----------  LV e&', average 8.43 cm/s ----------  LV E/e&', average 27.16 ----------  Ventricular septum Value Reference  IVS thickness, ED 12 mm ----------  LVOT Value Reference  LVOT ID, S 20 mm ----------  LVOT area 3.14 cm^2 ----------  LVOT peak velocity, S 91.8 cm/s ----------  LVOT mean velocity, S 60.4 cm/s ----------  LVOT VTI, S 21.2 cm ----------  Aortic valve Value Reference  Aortic valve peak velocity, S 461 cm/s ----------  Aortic valve mean velocity, S 334 cm/s ----------  Aortic valve VTI, S 118 cm ----------  Aortic mean gradient, S 51 mm Hg ----------  Aortic peak gradient, S 85 mm Hg ----------  VTI ratio, LVOT/AV 0.18 ----------  Aortic valve area, VTI 0.56 cm^2 ----------   Aortic valve area/bsa, VTI 0.25 cm^2/m^2 ----------  Velocity ratio, peak, LVOT/AV 0.2 ----------  Aortic valve area, peak velocity 0.63 cm^2 ----------  Aortic valve area/bsa, peak 0.27 cm^2/m^2 ----------  velocity  Velocity ratio, mean, LVOT/AV 0.18 ----------  Aortic valve area, mean velocity 0.57 cm^2 ----------  Aortic valve area/bsa, mean 0.25 cm^2/m^2 ----------  velocity  Aortic regurg pressure half-time 365 ms ----------  Aorta Value Reference  Aortic root ID, ED 37 mm ----------  Left atrium Value Reference  LA ID, A-P, ES 49 mm ----------  LA ID/bsa, A-P 2.15 cm/m^2 <=2.2  LA volume, S 98.5 ml ----------  LA volume/bsa, S 43.2 ml/m^2 ----------  LA volume, ES, 1-p A4C 83.2 ml ----------  LA volume/bsa, ES, 1-p A4C 36.5 ml/m^2 ----------  LA volume, ES, 1-p A2C 98.8 ml ----------  LA volume/bsa, ES, 1-p A2C 43.3 ml/m^2 ----------  Mitral valve Value Reference  Mitral E-wave peak velocity 229 cm/s ----------  Mitral mean velocity, D 113 cm/s ----------  Mitral deceleration time 211 ms 150 - 230  Mitral pressure half-time 120 ms ----------  Mitral mean gradient, D 8 mm Hg ----------  Mitral peak gradient, D 21 mm Hg ----------  Mitral valve area, PHT, DP 1.69 cm^2 ----------  Mitral valve area/bsa, PHT, DP 0.74 cm^2/m^2 ----------  Mitral valve area, LVOT 1 cm^2 ----------  continuity  Mitral valve area/bsa, LVOT 0.44 cm^2/m^2 ----------  continuity  Mitral annulus VTI, D 66.4 cm ----------  Pulmonary arteries Value Reference  PA pressure, S, DP 20 mm Hg <=30  Tricuspid valve Value Reference  Tricuspid regurg peak velocity 176 cm/s ----------  Tricuspid peak RV-RA gradient 12 mm Hg ----------  Tricuspid maximal regurg 176 cm/s ----------  velocity, PISA  Right atrium Value Reference  RA ID, S-I, ES, A4C (H) 59.4 mm 34 - 49  RA area, ES,  A4C (H) 20.8 cm^2 8.3 - 19.5  RA volume, ES, A/L 59.3 ml ----------  RA volume/bsa, ES, A/L 26 ml/m^2 ----------  Systemic  veins Value Reference  Estimated CVP 8 mm Hg ----------  Right ventricle Value Reference  TAPSE 15.1 mm ----------  RV pressure, S, DP 20 mm Hg <=30  RV s&', lateral, S 7.51 cm/s ----------  Legend:  (L) and (H) mark values outside specified reference range.  -------------------------------------------------------------------  Jonne Ply, M.D.  2019-02-26T08:03:22     [facility]  CARDIAC CATHETERIZATION  Order# [id number]  Reading physician: Sherren Mocha, MD Ordering physician: Sherren Mocha, MD Study date: 08/14/17  Physicians  Panel Physicians Referring Physician Case Authorizing Physician  Sherren Mocha, MD (Primary)    Procedures  AORTIC ARCH ANGIOGRAPHY  RIGHT/LEFT HEART CATH AND CORONARY/GRAFT ANGIOGRAPHY  Conclusion  Prox RCA lesion is 100% stenosed.  LIMA graft was visualized by angiography and is normal in caliber.  The graft exhibits no disease.  Mid LM to Dist LM lesion is 30% stenosed.  Ost Cx to Prox Cx lesion is 100% stenosed.  Ost LAD to Prox LAD lesion is 80% stenosed.  Prox LAD lesion is 100% stenosed.  Seq SVG-.  Mid LAD to Dist LAD lesion is 70% stenosed.  Hemodynamic findings consistent with severe pulmonary hypertension.  There is severe aortic valve stenosis. There is trivial (1+) aortic regurgitation. 1. Severe multivessel CAD with chronic total occlusion of the RCA, LCx, and LAD  2. S/p CABG with continued patency of the sequential SVG-OM1 and OM2 and LIMA-LAD  3. Moderate stenosis of the apical LAD at the site of previous angioplasty  4. Severe aortic stenosis with calculated AVA 0.89 square cm  5. Severe pulmonary HTN likely secondary to diastolic heart failure/left heart disease  6. Aortic root angiography demonstrates a valve deployment angle of LAO23, CRA12  Plan: continue multidisciplinary heart valve approach under TAVR evaluation, will increase furosemide to 40 mg daily, resume pradaxa tomorrow   Indications  Severe  aortic stenosis [I35.0 (ICD-10-CM)]  Procedural Details/Technique  Technical Details INDICATION: Severe aortic stenosis  PROCEDURAL DETAILS: There was an indwelling IV in a right antecubital vein. Using normal sterile technique, the IV was changed out for a 5 Fr brachial sheath over a 0.018 inch wire. The left wrist was then prepped, draped, and anesthetized with 1% lidocaine. Using the modified Seldinger technique a 5/6 French Slender sheath was placed in the left radial artery. Intra-arterial verapamil was administered through the radial artery sheath. IV heparin was administered after a JR4 catheter was advanced into the central aorta. A Swan-Ganz catheter was used for the right heart catheterization. Standard protocol was followed for recording of right heart pressures and sampling of oxygen saturations. Fick cardiac output was calculated. Standard Judkins catheters were used for selective coronary angiography, bypass graft angiography, and aortic root angiography. There were no immediate procedural complications. The patient was transferred to the post catheterization recovery area for further monitoring.     Estimated blood loss <50 mL.  During this procedure the patient was administered the following to achieve and maintain moderate conscious sedation: Versed 1 mg, Fentanyl 25 mcg, while the patient's heart rate, blood pressure, and oxygen saturation were continuously monitored. The period of conscious sedation was 26 minutes, of which I was present face-to-face 100% of this time.  Coronary Findings  Diagnostic  Dominance: Right  Left Main  Mid LM to Dist LM lesion 30% stenosed  Mid LM to Dist LM lesion  is 30% stenosed. The lesion is moderately calcified. The left main is diffusely diseased, calcified, without obstructive disease.  Left Anterior Descending  Ost LAD to Prox LAD lesion 80% stenosed  Ost LAD to Prox LAD lesion is 80% stenosed. The lesion is calcified.  Prox LAD lesion 100%  stenosed  Prox LAD lesion is 100% stenosed. The lesion is calcified.  Mid LAD to Dist LAD lesion 70% stenosed  Mid LAD to Dist LAD lesion is 70% stenosed. distal lesion, site of previous angioplasty, moderate stenosis  Left Circumflex  Ost Cx to Prox Cx lesion 100% stenosed  Ost Cx to Prox Cx lesion is 100% stenosed. The lesion is moderately calcified. The circumflex is occluded at the ostium.  Right Coronary Artery  Prox RCA lesion 100% stenosed  Prox RCA lesion is 100% stenosed. chronic occlusion  Right Posterior Descending Artery  Collaterals  RPDA filled by collaterals from 1st Sept.    LIMA LIMA Graft to Mid LAD  LIMA graft was visualized by angiography and is normal in caliber. The graft exhibits no disease. The LIMA-LAD is widely patent. The proximal LAD and first diagonal fill retrograde from graft flow.  Sequential jump graft Graft to [employer], 2nd Mrg  Seq SVG-. Sequential SVG to OM1 and OM2 is patent  Intervention  No interventions have been documented.  Right Heart  Right Heart Pressures Hemodynamic findings consistent with severe pulmonary hypertension. Elevated LV EDP consistent with volume overload.  Left Heart  Aortic Valve There is severe aortic valve stenosis. There is trivial (1+) aortic regurgitation. The aortic valve is calcified. There is restricted aortic valve motion. Mean gradient 38 mmHg, calculated AVA 0.89 square cm  Coronary Diagrams  Diagnostic Diagram     Implants     No implant documentation for this case.  MERGE Images  Link to Procedure Log   Show images for CARDIAC CATHETERIZATION Procedure Log  Hemo Data   Most Recent Value  Fick Cardiac Output 4.38 L/min  Fick Cardiac Output Index 2.1 (L/min)/BSA  Aortic Mean Gradient 38 mmHg  Aortic Peak Gradient 34 mmHg  Aortic Valve Area 0.89  Aortic Value Area Index 0.43 cm2/BSA  RA A Wave -99 mmHg  RA V Wave 16 mmHg  RA Mean 14 mmHg  RV Systolic Pressure 69 mmHg  RV Diastolic Pressure 7 mmHg    RV EDP 14 mmHg  PA Systolic Pressure 69 mmHg  PA Diastolic Pressure 27 mmHg  PA Mean 47 mmHg  PW A Wave -99 mmHg  PW V Wave 34 mmHg  PW Mean 25 mmHg  AO Systolic Pressure 412 mmHg  AO Diastolic Pressure 61 mmHg  AO Mean 92 mmHg  LV Systolic Pressure 878 mmHg  LV Diastolic Pressure 13 mmHg  LV EDP 22 mmHg  Arterial Occlusion Pressure Extended Systolic Pressure 676 mmHg  Arterial Occlusion Pressure Extended Diastolic Pressure 62 mmHg  Arterial Occlusion Pressure Extended Mean Pressure 87 mmHg  Left Ventricular Apex Extended Systolic Pressure 720 mmHg  Left Ventricular Apex Extended Diastolic Pressure 16 mmHg  Left Ventricular Apex Extended EDP Pressure 26 mmHg  QP/QS 1  TPVR Index 22.4 HRUI  TSVR Index 43.38 HRUI  PVR SVR Ratio 0.29  TPVR/TSVR Ratio 0.52     CT CORONARY MORPH W/CTA COR W/SCORE W/CA W/CM &/OR WO/CM (Accession [id number]) (Order [id number])  Imaging  Date: 08/22/2017 Department: [facility] CT IMAGING Released By: Reggy Eye Authorizing: Sherren Mocha, MD  Exam Information  Status Exam [facility]  Exam  Ended   Final [99] 08/22/2017 12:14 PM 08/22/2017 1:37 PM  PACS Images  Show images for CT CORONARY MORPH W/CTA COR W/SCORE W/CA W/CM &/OR WO/CM  Addendum   ADDENDUM REPORT: 08/23/2017 09:35  ADDENDUM:  Please see separate dictation for contemporaneously obtained CTA  chest, abdomen and pelvis 08/22/2017 for full description of  relevant extracardiac findings.  Electronically Signed  By: Vinnie Langton M.D.  On: 08/23/2017 09:35   Addended by Etheleen Mayhew, MD on 08/23/2017 9:37 AM  Study Result   CLINICAL DATA: Aortic Stenosis  EXAM:  Cardiac TAVR CT  TECHNIQUE:  The patient was scanned on a Siemens Force 326 slice scanner. A 120  kV retrospective scan was triggered in the ascending thoracic aorta  at 140 HU's. Gantry rotation speed was 250 msecs and collimation was  .6 mm. No beta blockade or nitro were given. The 3D data  set was  reconstructed in 5% intervals of the R-R cycle. Systolic and  diastolic phases were analyzed on a dedicated work station using  MPR, MIP and VRT modes. The patient received 80 cc of contrast.  FINDINGS:  Aortic Valve: Tri-leaflet calcified with restricted motion  Mitral Valve: Thickened with restricted diastolic motion but not  severe. Mild intervalvular fibrosa calcium. Calcium mostly at base  of mitral leaflets  No LAA Thrombus  Aorta: Severe calcific atherosclerosis  Sino-tubular Junction: 29 mm  Ascending Thoracic Aorta: 33 mm  Aortic Arch: 28 mm  Descending Thoracic Aorta: 26 mm  Sinus of Valsalva Measurements:  Non-coronary: 35.4 mm  Right - coronary: 31.2 mm  Left - coronary: 35.6 mm  Coronary Artery Height above Annulus:  Left Main: 13.3 mm above annulus  Right Coronary: 16.2 mm above annulus  There is a patent LIMA to LAD  There is a patent SVG to OM1/OM2  Virtual Basal Annulus Measurements:  Maximum / Minimum Diameter: 28.7 mm x 20.4 mm  Perimeter: 80.3 mm  Area: 490 mm2  Coronary Arteries: Sufficient height above annulus for deployment  with patent LIMA to LAD and SVG to OM1/OM2  Optimum Fluoroscopic Angle for Delivery: LAO 17 Caudal 10 degrees  IMPRESSION:  1. Calcified trileaflet AV with annulus 490 mm2 suitable for a 26 mm  Sapien 3 valve  2. Coronary arteries sufficient height above annulus for deployment  with patent LIMA to LAD and SVG to OM  3. Optimum angiographic angle for deployment LAO 17 degrees Caudal  10 degrees  4. No LAA thrombus  5. Severe calcific aortic atherosclerosis  Jenkins Rouge  Electronically Signed:  By: Jenkins Rouge M.D.  On: 08/22/2017 14:58      CT Angio Abd/Pel w/ and/or w/o (Accession [id number]) (Order [id number])  Imaging  Date: 08/22/2017 Department: [facility] CT IMAGING Released By: Beverley Fiedler Authorizing: Sherren Mocha, MD  Exam Information  Status Exam Begun  Exam Ended   Final [99]  08/22/2017 1:41 PM 08/22/2017 1:42 PM  PACS Images  Show images for CT Angio Abd/Pel w/ and/or w/o  Study Result  CLINICAL DATA: 84 year old male with history of severe aortic  stenosis. Preprocedural study prior to potential transcatheter  aortic valve replacement (TAVR) procedure.  EXAM:  CT ANGIOGRAPHY CHEST, ABDOMEN AND PELVIS  TECHNIQUE:  Multidetector CT imaging through the chest, abdomen and pelvis was  performed using the standard protocol during bolus administration of  intravenous contrast. Multiplanar reconstructed images and MIPs were  obtained and reviewed to evaluate the vascular anatomy.  CONTRAST: 80 mL ISOVUE-370 IOPAMIDOL (ISOVUE-370)  INJECTION 76%  COMPARISON: CT the abdomen and pelvis 07/18/2013.  FINDINGS:  CTA CHEST FINDINGS  Cardiovascular: Heart size is enlarged with moderate left atrial  dilatation. There is no significant pericardial fluid, thickening or  pericardial calcification. There is aortic atherosclerosis, as well  as atherosclerosis of the great vessels of the mediastinum and the  coronary arteries, including calcified atherosclerotic plaque in the  left main, left anterior descending, left circumflex and right  coronary arteries. Status post median sternotomy for CABG including  LIMA to the LAD. Thickening and calcifications of the aortic valve.  Ill-defined incomplete filling defect in the tip of the left atrial  appendage which demonstrates different attenuation than seen on the  accompanying cardiac CT, compatible with pseudo thrombus. No  definite thrombus noted in the left atrium or left atrial appendage.  Mediastinum/Lymph Nodes: No pathologically enlarged mediastinal or  hilar lymph nodes. Esophagus is unremarkable in appearance. No  axillary lymphadenopathy.  Lungs/Pleura: Small bilateral pleural effusions lying dependently.  No acute consolidative airspace disease. No pleural effusions. No  suspicious appearing pulmonary nodules or masses.   Musculoskeletal/Soft Tissues: Chronic appearing superior endplate  compression fracture of T12 with approximately 20% loss of central  vertebral body height. Median sternotomy wires. There are no  aggressive appearing lytic or blastic lesions noted in the  visualized portions of the skeleton.  CTA ABDOMEN AND PELVIS FINDINGS  Hepatobiliary: No suspicious cystic or solid hepatic lesions.  Lateral aspect of the right lobe of the liver is incompletely  imaged. Small amount of high attenuation material lying dependently  in the gallbladder may reflect biliary sludge and/or multiple tiny  partially calcified gallstones. No findings to suggest an acute  cholecystitis at this time.  Pancreas: No pancreatic mass. No pancreatic ductal dilatation. No  pancreatic or peripancreatic fluid or inflammatory changes.  Spleen: Unremarkable.  Adrenals/Urinary Tract: Mild bilateral renal atrophy. Subcentimeter  low-attenuation lesion in the lower pole the left kidney, too small  to characterize, but favored to represent a tiny cyst. Right kidney  and bilateral adrenal glands are otherwise normal in appearance. No  hydroureteronephrosis. Urinary bladder is normal in appearance.  Stomach/Bowel: Along the proximal aspect of the lesser curvature of  the stomach there is a 16 x 9 x 15 mm focus of apparent enhancement  (axial image 90 of series 13 and coronal image 72 of series 15),  which was not evident on the preceding cardiac CTA obtained only 12  seconds earlier, which is in the midst of an area of apparent  excavation of the mucosa, suspicious for potential ulcerated mass.  No pathologic dilatation of small bowel or colon. The appendix is  not confidently identified and may be surgically absent. Regardless,  there are no inflammatory changes noted adjacent to the cecum to  suggest the presence of an acute appendicitis at this time.  Vascular/Lymphatic: Vascular findings and measurements pertinent to    potential TAVR procedure, as detailed below. No aneurysm or  dissection of the abdominal or pelvic vasculature. Celiac axis,  superior mesenteric artery and inferior mesenteric artery are all  widely patent without hemodynamically significant stenosis. Single  renal arteries bilaterally, with stent at the ostium of the left  renal artery. Both renal arteries are grossly patent without  hemodynamically significant stenosis. No lymphadenopathy noted in  the abdomen or pelvis.  Reproductive: Prostate gland and seminal vesicles are unremarkable  in appearance.  Other: No significant volume of ascites. No pneumoperitoneum.  Musculoskeletal: There are no aggressive appearing  lytic or blastic  lesions noted in the visualized portions of the skeleton.  VASCULAR MEASUREMENTS PERTINENT TO TAVR:  AORTA:  Minimal Aortic Diameter-13 x 13 mm  Severity of Aortic Calcification - severe  RIGHT PELVIS:  Right Common Iliac Artery -  Minimal Diameter-5.4 x 4.6 mm  Tortuosity-mild  Calcification-severe  Right External Iliac Artery -  Minimal Diameter-5.2 x 3.9 mm  Tortuosity-moderate  Calcification - severe  Right Common Femoral Artery -  Minimal Diameter-4.5 x 1.6 mm  Tortuosity-mild  Calcification - severe  LEFT PELVIS:  Left Common Iliac Artery -  Minimal Diameter-6.3 x 3.3 mm  Tortuosity-mild  Calcification - severe  Left External Iliac Artery -  Minimal Diameter-7.4 x 5.0 mm  Tortuosity-moderate  Calcification - severe  Left Common Femoral Artery -  Minimal Diameter-4.1 x 3.3 mm  Tortuosity-mild  Calcification - severe  Review of the MIP images confirms the above findings.  IMPRESSION:  1. Vascular findings and measurements pertinent to potential TAVR  procedure, as detailed above.  2. Thickening calcification of the aortic valve, compatible with the  reported clinical history of severe aortic stenosis.  3. Possible ulcerated enhancing gastric mass along the proximal  lesser  curvature of the stomach, as discussed above. Further  evaluation with nonemergent endoscopy is recommended in the near  future to exclude the possibility of gastric neoplasm and/or ulcer.  4. Calcifications of the mitral valve and mitral annulus.  5. Aortic atherosclerosis, in addition to left main and 3 vessel  coronary artery disease. Status post median sternotomy for CABG  including LIMA to the LAD.  6. Small bilateral pleural effusions lying dependently.  7. Additional incidental findings, as above.  These results will be called to the ordering clinician or  representative by the Radiologist Assistant, and communication  documented in the PACS or zVision Dashboard.  Aortic Atherosclerosis (ICD10-I70.0).  Electronically Signed  By: Vinnie Langton M.D.  On: 08/23/2017 12:00     Impression:   This 84 year old gentleman has stage D, severe, symptomatic aortic stenosis and moderate mitral stenosis with New York Heart Association class III symptoms of exertional fatigue and shortness of breath consistent with chronic diastolic congestive heart failure. I have personally reviewed his echocardiogram, cardiac catheterization, and CTA studies. His echocardiogram shows a trileaflet aortic valve with severely thickened and calcified leaflets with restricted mobility. The mean transvalvular gradient is 51 mmHg consistent with severe aortic stenosis. The mitral valve has thickened and severely calcified leaflets with a mean gradient of 8 mmHg and a peak gradient of 21 mmHg consistent with moderate mitral stenosis. Left ventricular systolic function is preserved with an ejection fraction of 55-60% and moderate left ventricular hypertrophy. Cardiac catheterization shows patent bypass grafts to the LAD and left circumflex territories. There is moderate to severe pulmonary hypertension. His operative risk for open surgical redo sternotomy with aortic valve replacement and mitral valve replacement would be  prohibitive given his advanced age and comorbid conditions. I think transcatheter aortic valve replacement would be the best option for him and hopefully will improve his symptoms despite having moderate mitral stenosis. He is gated cardiac CT shows anatomy suitable for transcatheter aortic valve replacement with no complicating features. His abdominal and pelvic CTA shows diffuse calcific atherosclerosis of the aortoiliac and femoral vessels and I do not think there is adequate access for transfemoral insertion. The left axillary artery or left carotid artery appear suitable for access or trans-apical would be an option. His carotid Dopplers in 03/2017 did show a 40-59%  left internal carotid artery stenosis. Repeat studies are pending. His PFTs show severe obstructive disease as well as a severe diffusion capacity deficit which may increase his risk with a transapical insertion.   He also has a 16 x 9 x 15 mm focus of thickening and enhancement along the lesser curvature of the stomach which is suspicious for a potential ulcerated mass. The patient currently has no GI symptoms but does have a history of a duodenal ulcer in the distant past which he thinks was in the 1990s that was treated with medication. This area in the stomach could certainly be an ulcer or a malignancy. He will require EGD to evaluate this further but given the absence of symptoms and the severity of his aortic stenosis I think it would be appropriate to proceed with transcatheter aortic valve replacement and work this up after he has recovered. I reviewed the CT images of this with the patient and his wife and answered their questions and they are in agreement with having a GI workup done afterwards.   The patient and his wife were counseled at length regarding treatment alternatives for management of severe symptomatic aortic stenosis. The risks and benefits of surgical intervention has been discussed in detail. Long-term prognosis with  medical therapy was discussed. Alternative approaches such as conventional surgical aortic valve replacement, transcatheter aortic valve replacement, and palliative medical therapy were compared and contrasted at length. This discussion was placed in the context of the patient's own specific clinical presentation and past medical history. All of their questions have been addressed.   A discussion was held regarding what types of management strategies would be attempted intraoperatively in the event of life-threatening complications, including whether or not the patient would be considered a candidate for the use of cardiopulmonary bypass and/or conversion to open sternotomy for attempted surgical intervention. The patient has been advised of a variety of complications that might develop including but not limited to risks of death, stroke, paravalvular leak, aortic dissection or other major vascular complications, aortic annulus rupture, device embolization, cardiac rupture or perforation, mitral regurgitation, acute myocardial infarction, arrhythmia, heart block or bradycardia requiring permanent pacemaker placement, congestive heart failure, respiratory failure, renal failure, pneumonia, infection, other late complications related to structural valve deterioration or migration, or other complications that might ultimately cause a temporary or permanent loss of functional independence or other long term morbidity. The patient provides full informed consent for the procedure as described and all questions were answered. The patient and his wife understand he will need to return for a second surgical evaluation before making a final decision about the appropriateness of proceeding with transcatheter aortic valve replacement.  Plan:   Left axillary artery approach for TAVR.   Gaye Pollack, MD

## 2017-09-18 ENCOUNTER — Encounter (HOSPITAL_COMMUNITY): Payer: Self-pay | Admitting: Cardiovascular Disease

## 2017-09-18 ENCOUNTER — Other Ambulatory Visit: Payer: Self-pay

## 2017-09-18 ENCOUNTER — Inpatient Hospital Stay (HOSPITAL_COMMUNITY): Payer: Medicare Other

## 2017-09-18 DIAGNOSIS — Z952 Presence of prosthetic heart valve: Secondary | ICD-10-CM

## 2017-09-18 DIAGNOSIS — Z954 Presence of other heart-valve replacement: Secondary | ICD-10-CM

## 2017-09-18 DIAGNOSIS — I350 Nonrheumatic aortic (valve) stenosis: Secondary | ICD-10-CM

## 2017-09-18 LAB — BLOOD GAS, ARTERIAL

## 2017-09-18 LAB — BASIC METABOLIC PANEL
Anion gap: 6 (ref 5–15)
BUN: 8 mg/dL (ref 6–20)
CALCIUM: 8.4 mg/dL — AB (ref 8.9–10.3)
CO2: 25 mmol/L (ref 22–32)
Chloride: 106 mmol/L (ref 101–111)
Creatinine, Ser: 1.02 mg/dL (ref 0.61–1.24)
GFR calc non Af Amer: >60 mL/min (ref >60)
GLUCOSE: 151 mg/dL — AB (ref 65–99)
Potassium: 4 mmol/L (ref 3.5–5.1)
Sodium: 137 mmol/L (ref 135–145)

## 2017-09-18 LAB — CBC
HEMATOCRIT: 36.6 % — AB (ref 39.0–52.0)
Hemoglobin: 11.6 g/dL — ABNORMAL LOW (ref 13.0–17.0)
MCH: 29.7 pg (ref 26.0–34.0)
MCHC: 31.7 g/dL (ref 30.0–36.0)
MCV: 93.8 fL (ref 78.0–100.0)
Platelets: 168 10*3/uL (ref 150–400)
RBC: 3.9 MIL/uL — ABNORMAL LOW (ref 4.22–5.81)
RDW: 16.1 % — AB (ref 11.5–15.5)
WBC: 10.1 10*3/uL (ref 4.0–10.5)

## 2017-09-18 LAB — GLUCOSE, CAPILLARY
GLUCOSE-CAPILLARY: 139 mg/dL — AB (ref 65–99)
GLUCOSE-CAPILLARY: 168 mg/dL — AB (ref 65–99)
GLUCOSE-CAPILLARY: 169 mg/dL — AB (ref 65–99)
Glucose-Capillary: 141 mg/dL — ABNORMAL HIGH (ref 65–99)
Glucose-Capillary: 152 mg/dL — ABNORMAL HIGH (ref 65–99)
Glucose-Capillary: 171 mg/dL — ABNORMAL HIGH (ref 65–99)

## 2017-09-18 LAB — POCT I-STAT 4, (NA,K, GLUC, HGB,HCT)
GLUCOSE: 163 mg/dL — AB (ref 65–99)
HCT: 35 % — ABNORMAL LOW (ref 39.0–52.0)
HEMOGLOBIN: 11.9 g/dL — AB (ref 13.0–17.0)
POTASSIUM: 3.6 mmol/L (ref 3.5–5.1)
SODIUM: 143 mmol/L (ref 135–145)

## 2017-09-18 LAB — ECHOCARDIOGRAM LIMITED
Height: 71 in
Weight: 3572.8 oz

## 2017-09-18 LAB — MAGNESIUM: Magnesium: 1.9 mg/dL (ref 1.7–2.4)

## 2017-09-18 MED ORDER — TRAMADOL HCL 50 MG PO TABS
50.0000 mg | ORAL_TABLET | Freq: Four times a day (QID) | ORAL | Status: DC | PRN
  Administered 2017-09-19: 50 mg via ORAL
  Filled 2017-09-18: qty 1

## 2017-09-18 MED ORDER — FUROSEMIDE 40 MG PO TABS
40.0000 mg | ORAL_TABLET | Freq: Every day | ORAL | Status: DC
  Administered 2017-09-18 – 2017-09-21 (×4): 40 mg via ORAL
  Filled 2017-09-18 (×4): qty 1

## 2017-09-18 NOTE — Progress Notes (Signed)
1 Day Post-Op Procedure(s) (LRB): TRANSCATHETER AORTIC VALVE REPLACEMENT, LEFT SUBCLAVIAN using an Edwards 62mm Sapien 3 Aortic Valve (Left) TRANSESOPHAGEAL ECHOCARDIOGRAM (TEE) (N/A) Subjective: No complaints. Says his breathing is better. Has only ambulated in the room.  Objective: Vital signs in last 24 hours: Temp:  [97.5 F (36.4 C)-99.5 F (37.5 C)] 99.5 F (37.5 C) (04/17 0432) Pulse Rate:  [77-107] 107 (04/17 0600) Cardiac Rhythm: Atrial fibrillation (04/17 0400) Resp:  [12-21] 20 (04/17 0600) BP: (114-172)/(55-82) 121/72 (04/17 0600) SpO2:  [92 %-99 %] 92 % (04/17 0600) Arterial Line BP: (172-193)/(55-63) 184/55 (04/16 1800) Weight:  [101.3 kg (223 lb 4.8 oz)] 101.3 kg (223 lb 4.8 oz) (04/16 1027)  Hemodynamic parameters for last 24 hours:    Intake/Output from previous day: 04/16 0701 - 04/17 0700 In: 1133 [I.V.:1133] Out: 925 [Urine:850; Blood:75] Intake/Output this shift: No intake/output data recorded.  General appearance: alert and cooperative Neurologic: intact Heart: irregularly irregular rhythm Lungs: clear to auscultation bilaterally Extremities: extremities normal, atraumatic, no cyanosis or edema Wound: dressing dry. groin sites ok  Lab Results: Recent Labs    09/17/17 1631 09/18/17 0602  WBC  --  10.1  HGB 11.9* 11.6*  HCT 35.0* 36.6*  PLT  --  168   BMET:  Recent Labs    09/17/17 1532 09/17/17 1631 09/18/17 0602  NA 140 143 137  K 3.2* 3.6 4.0  CL 103  --  106  CO2  --   --  25  GLUCOSE 163* 163* 151*  BUN 9  --  8  CREATININE 0.70  --  1.02  CALCIUM  --   --  8.4*    PT/INR: No results for input(s): LABPROT, INR in the last 72 hours. ABG    Component Value Date/Time   PHART 7.394 09/17/2017 1225   HCO3 27.9 09/17/2017 1225   TCO2 26 09/17/2017 1532   O2SAT 95.0 09/17/2017 1225   CBG (last 3)  Recent Labs    09/17/17 2042 09/18/17 0001 09/18/17 0430  GLUCAP 160* 139* 141*   CXR: mild interstitial edema  ECG: atrial  fib, LBBB ( old but QRS is wider)  Assessment/Plan: S/P Procedure(s) (LRB): TRANSCATHETER AORTIC VALVE REPLACEMENT, LEFT SUBCLAVIAN using an Edwards 65mm Sapien 3 Aortic Valve (Left) TRANSESOPHAGEAL ECHOCARDIOGRAM (TEE) (N/A)  POD 1   Hemodynamically stable. Resume Lotensin tomorrow.  Permanent atrial fibrillation: rate controlled. Continue Lopressor. Resume Pradaxa at discharge.  DC central line  DM: glucose under control. Continue SSI. Can resume glucophage at discharge.  2D echo today.  Transfer to 4E and ambulate. Plan home tomorrow.   LOS: 1 day    Gaye Pollack 09/18/2017

## 2017-09-18 NOTE — Progress Notes (Signed)
Bladder scanned at 1645 showing 396 cc in bladder.  Pt denies urge to void.  Reported off to [place].

## 2017-09-18 NOTE — Progress Notes (Signed)
  Echocardiogram 2D Echocardiogram has been performed.  [name] 09/18/2017, 11:10 AM

## 2017-09-18 NOTE — Progress Notes (Addendum)
CARDIAC REHAB PHASE I   PRE:  Rate/Rhythm: 86 afib    BP: sitting 122/58    SaO2: 93 2L  MODE:  Ambulation: 190 ft   POST:  Rate/Rhythm: 103 afib    BP: sitting 126/68     SaO2: 87 2L, 98 4L with rest, back to 94 2L  Pt needed attempt x3 to stand from bed. Used RW, 2L, gait belt. Slightly unsteady, trouble steering at times. Quite tired after walk with SaO2 87 2L. Increased O2 to 4L briefly and encouraged PLB. Pt has O2 at home but sts he only uses it PRN during the day. Gave pt IS and he practiced, 750 mL. Encouraged walking x3 a day at home, using RW and O2. Pt at this point not ready for d/c. Suggest PT c/s. Declined CRPII.  [id number]   [place], ACSM 09/18/2017 2:08 PM

## 2017-09-19 ENCOUNTER — Other Ambulatory Visit: Payer: Self-pay

## 2017-09-19 DIAGNOSIS — Z952 Presence of prosthetic heart valve: Secondary | ICD-10-CM

## 2017-09-19 LAB — GLUCOSE, CAPILLARY
GLUCOSE-CAPILLARY: 163 mg/dL — AB (ref 65–99)
Glucose-Capillary: 168 mg/dL — ABNORMAL HIGH (ref 65–99)
Glucose-Capillary: 135 mg/dL — ABNORMAL HIGH (ref 65–99)
Glucose-Capillary: 148 mg/dL — ABNORMAL HIGH (ref 65–99)

## 2017-09-19 MED ORDER — DABIGATRAN ETEXILATE MESYLATE 150 MG PO CAPS
150.0000 mg | ORAL_CAPSULE | Freq: Two times a day (BID) | ORAL | Status: DC
  Administered 2017-09-19 – 2017-09-21 (×5): 150 mg via ORAL
  Filled 2017-09-19 (×6): qty 1

## 2017-09-19 MED ORDER — BENAZEPRIL HCL 5 MG PO TABS
10.0000 mg | ORAL_TABLET | Freq: Every day | ORAL | Status: DC
  Administered 2017-09-19 – 2017-09-21 (×3): 10 mg via ORAL
  Filled 2017-09-19 (×3): qty 2

## 2017-09-19 MED ORDER — TERAZOSIN HCL 5 MG PO CAPS
5.0000 mg | ORAL_CAPSULE | Freq: Every day | ORAL | Status: DC
  Administered 2017-09-19 – 2017-09-21 (×3): 5 mg via ORAL
  Filled 2017-09-19 (×4): qty 1

## 2017-09-19 NOTE — Evaluation (Signed)
Physical Therapy Evaluation Patient Details Name: [name] MRN: [id number] DOB: [birth year] Today's Date: 09/19/2017   History of Present Illness  Pt is an 84 y/o male s/p TAVR. PMH includes a fib, CHF, COPD on 2L of home oxygen, HTN, CAD s/p CABG, and PVD.   Clinical Impression  Pt s/p surgery above with deficits below. Gait limited secondary to fatigue. Presenting with mild unsteadiness and required min guard for ambulation with use of RW. [name] oxygen sats decreasing to 88% on 2L during gait, however, increased to 95% on 2L with seated rest and pursed lip breathing. Pt reports wife is able to assist at home as needed. Will continue to follow acutely to maximize functional mobility independence and safety.     Follow Up Recommendations Home health PT;Supervision for mobility/OOB    Equipment Recommendations  None recommended by PT    Recommendations for Other Services       Precautions / Restrictions Precautions Precautions: Fall;Other (comment) Precaution Comments: watch oxygen sats  Restrictions Weight Bearing Restrictions: No      Mobility  Bed Mobility Overal bed mobility: Needs Assistance Bed Mobility: Supine to Sit;Sit to Supine     Supine to sit: Min assist Sit to supine: Min assist   General bed mobility comments: Light min A for assist with LE management during bed mobility. Required min A for trunk elevation to come up to sitting.   Transfers Overall transfer level: Needs assistance Equipment used: Rolling walker (2 wheeled) Transfers: Sit to/from Stand Sit to Stand: Min assist         General transfer comment: Min A for lift assist and steadying. Demonstrated safe hand placement.   Ambulation/Gait Ambulation/Gait assistance: Min guard Ambulation Distance (Feet): 125 Feet Assistive device: Rolling walker (2 wheeled) Gait Pattern/deviations: Step-through pattern;Decreased stride length;Trunk flexed Gait velocity: Decreased  Gait velocity  interpretation: <1.8 ft/sec, indicate of risk for recurrent falls General Gait Details: Slow, guarded gait with mild unsteadiness noted. No overt LOB noted. Distance limited secondary to fatigue. Educated to use RW at home to increase safety with mobility.   Stairs            Wheelchair Mobility    Modified Rankin (Stroke Patients Only)       Balance Overall balance assessment: Needs assistance Sitting-balance support: No upper extremity supported;Feet supported Sitting balance-Leahy Scale: Good     Standing balance support: Bilateral upper extremity supported;During functional activity Standing balance-Leahy Scale: Poor Standing balance comment: Reliant on BUE support                              Pertinent Vitals/Pain Pain Assessment: 0-10 Pain Score: 2  Pain Location: L knee  Pain Descriptors / Indicators: Sore Pain Intervention(s): Monitored during session;Limited activity within patient's tolerance;Repositioned    Home Living Family/patient expects to be discharged to:: Private residence Living Arrangements: Spouse/significant other Available Help at Discharge: Family;Available 24 hours/day Type of Home: House Home Access: Stairs to enter Entrance Stairs-Rails: Right Entrance Stairs-Number of Steps: 3 Home Layout: One level Home Equipment: Walker - 2 wheels;Cane - single point;Shower seat      Prior Function Level of Independence: Independent with assistive device(s)         Comments: Mostly uses cane      Hand Dominance   Dominant Hand: Right    Extremity/Trunk Assessment   Upper Extremity Assessment Upper Extremity Assessment: Overall WFL for tasks assessed    Lower  Extremity Assessment Lower Extremity Assessment: Generalized weakness    Cervical / Trunk Assessment Cervical / Trunk Assessment: Normal  Communication   Communication: No difficulties  Cognition Arousal/Alertness: Awake/alert Behavior During Therapy: WFL for  tasks assessed/performed Overall Cognitive Status: Within Functional Limits for tasks assessed                                        General Comments General comments (skin integrity, edema, etc.): [name] oxygen sats decreasing to 88% on 2L, however, with seated rest and pursed lip breathing, incresaed to 95% on 2L.     Exercises     Assessment/Plan    PT Assessment Patient needs continued PT services  PT Problem List Cardiopulmonary status limiting activity;Decreased strength;Decreased balance;Decreased activity tolerance;Decreased mobility;Decreased knowledge of use of DME       PT Treatment Interventions DME instruction;Gait training;Stair training;Functional mobility training;Therapeutic activities;Therapeutic exercise;Balance training;Neuromuscular re-education;Patient/family education    PT Goals (Current goals can be found in the Care Plan section)  Acute Rehab PT Goals Patient Stated Goal: to feel better  PT Goal Formulation: With patient Time For Goal Achievement: 10/03/17 Potential to Achieve Goals: Good    Frequency Min 3X/week   Barriers to discharge        Co-evaluation               AM-PAC PT "6 Clicks" Daily Activity  Outcome Measure Difficulty turning over in bed (including adjusting bedclothes, sheets and blankets)?: A Little Difficulty moving from lying on back to sitting on the side of the bed? : Unable Difficulty sitting down on and standing up from a chair with arms (e.g., wheelchair, bedside commode, etc,.)?: Unable Help needed moving to and from a bed to chair (including a wheelchair)?: A Little Help needed walking in hospital room?: A Little Help needed climbing 3-5 steps with a railing? : A Little 6 Click Score: 14    End of Session Equipment Utilized During Treatment: Gait belt;Oxygen Activity Tolerance: Patient limited by fatigue Patient left: in bed;with call bell/phone within reach Nurse Communication: Mobility  status PT Visit Diagnosis: Unsteadiness on feet (R26.81);Other abnormalities of gait and mobility (R26.89);Muscle weakness (generalized) (M62.81)    Time: 6503-5465 PT Time Calculation (min) (ACUTE ONLY): 37 min   Charges:   PT Evaluation $PT Eval Low Complexity: 1 Low PT Treatments $Gait Training: 8-22 mins   PT G Codes:        Leighton Ruff, PT, DPT  Acute Rehabilitation Services  Pager: [phone]   Rudean Hitt 09/19/2017, 10:20 AM

## 2017-09-19 NOTE — Progress Notes (Addendum)
[address]       [place] [address]             [phone]      2 Days Post-Op Procedure(s) (LRB): TRANSCATHETER AORTIC VALVE REPLACEMENT, LEFT SUBCLAVIAN using an Edwards 45mm Sapien 3 Aortic Valve (Left) TRANSESOPHAGEAL ECHOCARDIOGRAM (TEE) (N/A)   Subjective:  Patient has been unable to void since foley removal.  He required I/O cath in the middle of the night.  He states this is because he isn't getting his prostate medication as he takes it at home.  I explained to the patient that his prostate is likely swollen due to recent foley placement and if he is unable to void he will need to have a catheter in place that he would go home with.  Objective: Vital signs in last 24 hours: Temp:  [97.9 F (36.6 C)-99.2 F (37.3 C)] 97.9 F (36.6 C) (04/18 0756) Pulse Rate:  [83-91] 85 (04/18 0756) Cardiac Rhythm: Atrial fibrillation (04/18 0700) Resp:  [14-20] 18 (04/18 0756) BP: (100-129)/(53-63) 129/57 (04/18 0756) SpO2:  [92 %-98 %] 97 % (04/18 0756)  Intake/Output from previous day: 04/17 0701 - 04/18 0700 In: 710 [P.O.:610; IV Piggyback:100] Out: 700 [Urine:700]  General appearance: alert, cooperative and no distress Heart: irregular rate and rhythm Lungs: clear to auscultation bilaterally Abdomen: soft, non-tender; bowel sounds normal; no masses,  no organomegaly Extremities: extremities normal, atraumatic, no cyanosis or edema Wound: clean and dry  Lab Results: Recent Labs    09/17/17 1631 09/18/17 0602  WBC  --  10.1  HGB 11.9* 11.6*  HCT 35.0* 36.6*  PLT  --  168   BMET:  Recent Labs    09/17/17 1532 09/17/17 1631 09/18/17 0602  NA 140 143 137  K 3.2* 3.6 4.0  CL 103  --  106  CO2  --   --  25  GLUCOSE 163* 163* 151*  BUN 9  --  8  CREATININE 0.70  --  1.02  CALCIUM  --   --  8.4*    PT/INR: No results for input(s): LABPROT, INR in the last 72 hours. ABG    Component Value Date/Time   PHART 7.394 09/17/2017 1225   HCO3  27.9 09/17/2017 1225   TCO2 26 09/17/2017 1532   ACIDBASEDEF TEST WILL BE CREDITED 09/13/2017 1049   O2SAT 95.0 09/17/2017 1225   CBG (last 3)  Recent Labs    09/18/17 1615 09/18/17 2114 09/19/17 0618  GLUCAP 169* 171* 168*    Assessment/Plan: S/P Procedure(s) (LRB): TRANSCATHETER AORTIC VALVE REPLACEMENT, LEFT SUBCLAVIAN using an Edwards 59mm Sapien 3 Aortic Valve (Left) TRANSESOPHAGEAL ECHOCARDIOGRAM (TEE) (N/A)  1. CV- hemodynamically stable, rate controlled A. Fib- continue Lopressor, will restart home Lotensin, restart Pradaxa 2. Gu- urinary retention, on home Hytrin which he wishes to continue, I/O cath has performed once.. If he is unable to void will have to replace foley catheter with outpatient follow up with Urology 3. DM- sugars controlled 4. Dispo- patient with urinary retention, continue home hytrin, will require foley placement if unable to void, restart home Lotensin, Pradaxa   LOS: 2 days    Ellwood Handler 09/19/2017   Chart reviewed, patient examined, agree with above. Has not voided on his own yet since foley removed. In/out cathed overnight. If he can't void he will need foley inserted and left in for a day or two. I would not send him home with a catheter in with new valve  in place unless two day trial of foley here in the hospital does not quiet things down. His urologist is at the [place] and he would like to follow up with a urologist in [place] if he needs to go home with a catheter in.

## 2017-09-19 NOTE — Progress Notes (Signed)
No spontaneous urine output since the patient was transferred to the unit,Bladder scan residual volume is 690 ml.In and out cath done and obtain 700 ml output.Will continue to monitor

## 2017-09-19 NOTE — Discharge Instructions (Signed)
ACTIVITY AND EXERCISE °• Daily activity and exercise are an important part °of your recovery. People recover at different rates °depending on their general health and type of °valve procedure. °• Most people require six to 10 weeks to feel °recovered. °• No lifting, pushing, pulling more than 10 pounds °(examples to avoid: groceries, vacuuming, °gardening, golfing): °- For one week with a procedure through the groin. °- For six weeks for procedures through the chest °wall. °- For three months for procedures through the °breast-bone. °• After the initial healing process of the access site, °we recommend cardiac rehabilitation for all TAVR °patients. Cardiac rehabilitation will help you: °- Rebuild stamina, strength and balance. °- Learn how to participate in activities safely, as well °as help you regain confidence to do so. °- Return to activities of daily living and leisure. °• Discuss attending cardiac rehabilitation at your °follow-up appointment  ° °DRIVING °• Do not drive for four weeks after the date of your °procedure. °• If you have been told by your doctor in the past °that you may not drive, you must talk with him/her °before you begin driving again. °• When you resume driving, you must have someone °with you. ° °HYGIENE °If you had a femoral (leg) procedure, you may take a shower when you return home. After the shower, pat the °site dry. Do NOT use powder, oils or lotions in your groin area until the site has completely healed. °• If you had a chest procedure, you may shower when you return home unless specifically instructed not to by °your discharging practitioner. °- DO NOT scrub incision; pat dry with a towel °- DO NOT apply any lotions, oils, powders to the incision °- No tub baths / swimming for at least six weeks. ° °SITE CARE °• You likely will have small openings in both groins °from catheters used during the procedure. If you °had a transfemoral procedure, one groin will have a °larger opening  and may be bruised or tender. °• If you had a chest procedure, you will have either a °small incision in your upper sternum (breast-bone) °or between your ribs on your left side. °- Chest wall site: The surgical incision should be °kept dry (no lotions / oils / powders) and open °to air. If you experience irritation from clothing °rubbing on the incision, a light gauze dressing °may be applied. °- Inspect your incision daily; notify your °physician if there is increased redness, swelling °or drainage from the incision. °- If the incision is located on your breast-bone °you must avoid lifting objects heavier than a °gallon of milk (eight pounds) and stretching / °twisting / pulling with your arms for at least °three months to ensure strong bone healing. ° ° °CONTACT [phone]- °• Check your sites daily. Contact our office if you have °any of the following problems: °- Redness and warmth that does not go away °- Yellow or green drainage from the wound °- Fever and chills °- Increasing numbness in your legs °- Worsening pain at the site °• If you had a leg/groin procedure, it is normal to °have bruising or a soft lump at the site. It is not °normal if the lump suddenly becomes larger or °more firm. This may mean you are bleeding. If this °happens: °- Lie down °- Have someone press down hard, just above °the hole in your skin where the procedure was °performed for 15 minutes. If after holding on the °site, the lump does not become larger or harder, °they   are performing this correctly. °- If the bleeding has stopped after 15 minutes, rest °and stay laying down for at least two hours. °- If the bleeding continues, call 911 for an °ambulance. Do NOT drive yourself or have °someone else drive you. °

## 2017-09-19 NOTE — Care Management Note (Signed)
Case Management Note Marvetta Gibbons RN, BSN Unit 4E-Case Manager [phone]  Patient Details  Name: [name] MRN: [id number] Date of Birth: 22-May-1932  Subjective/Objective:    Pt admitted s/p TAVR                Action/Plan: PTA pt lived at home with wife, spoke with pt and wife at the bedside for transition of care needs- pt has home 53 with [facility] (baseline 2L) wife will bring portable concentrator for d/c home. Pt also has nebulizer, cane and walker at home- no other DME needs noted. Per PT eval- recommendation for HHPT- order has been placed- choice offered to pt and wife for [facility] agency- per wife they do not have a preference- pt has both Medicare and Tricare- offered [facility]- as they are in network with [place]- wife and pt agreeable to [facility]. Referral for [facility] needs called to [facility] with [facility]- Referral has been accepted.   Confirmed d/c address as: [address] [place] [address] Phone: [phone]  Expected Discharge Date:                  Expected Discharge Plan:  [place]  In-House Referral:  NA  Discharge planning Services  CM Consult  Post Acute Care Choice:  Home Health Choice offered to:  Patient  DME Arranged:    DME Agency:     HH Arranged:    [place] Agency:     Status of Service:  In process, will continue to follow  If discussed at Long Length of Stay Meetings, dates discussed:    Discharge Disposition: home/home health   Additional Comments:  Dawayne Patricia, RN 09/19/2017, 3:27 PM

## 2017-09-19 NOTE — Care Management Note (Signed)
Case Management Note Marvetta Gibbons RN, BSN Unit 4E-Case Manager [phone]  Patient Details  Name: [name] MRN: [id number] Date of Birth: 11/13/1931  Subjective/Objective:   Pt admitted s/p TAVR- post op urinary retention                 Action/Plan: PTA pt lived at home with spouse- has home 02-baseline 2L. Per PT eval recommendation for HHPT- will need HH orders with [facility] for discharge- CM to follow for transition of care needs and [facility] arrangements if order placed.   Expected Discharge Date:                  Expected Discharge Plan:  [place]  In-House Referral:  NA  Discharge planning Services  CM Consult  Post Acute Care Choice:  Home Health Choice offered to:  Patient  DME Arranged:    DME Agency:     HH Arranged:    [place] Agency:     Status of Service:  In process, will continue to follow  If discussed at Long Length of Stay Meetings, dates discussed:    Discharge Disposition:   Additional Comments:  Dawayne Patricia, RN 09/19/2017, 11:40 AM

## 2017-09-19 NOTE — Discharge Summary (Signed)
Physician Discharge Summary  Patient ID: [name] MRN: [id number] DOB/AGE: June 23, 1931 84 y.o.  Admit date: 09/17/2017 Discharge date: 09/21/2017  Admission Diagnoses:  Patient Active Problem List   Diagnosis Date Noted  . Severe aortic stenosis 09/17/2017  . Mitral stenosis   . Aftercare following surgery of the circulatory system 03/15/2014  . Carotid stenosis 03/15/2014  . Unstable angina ([place]) 08/31/2013  . Acute myocardial infarction, unspecified site, initial episode of care   . Aortic stenosis   . Atrial fibrillation ([place])   . Vertigo 06/27/2013  . Chest pain with moderate risk of acute coronary syndrome 06/27/2013  . CAD - CABG '94, low risk Myoview May 2014 06/27/2013  . Chronic atrial fibrillation ([place]) 06/27/2013  . Chronic anticoagulation 06/27/2013  . Gout attack- on steroid dose pack 06/27/2013  . Tachycardia- beta blocker increased 06/26/2013  . Essential hypertension, benign 03/10/2009  . HYPERLIPIDEMIA 09/09/2008  . UNSPECIFIED ANEMIA 09/09/2008  . CAROTID STENOSIS- moderate 09/09/2008  . UNSPECIFIED CEREBROVASCULAR DISEASE 09/09/2008  . RENAL ARTERY STENOSIS- moderate 09/09/2008  . PERIPHERAL VASCULAR DISEASE 09/09/2008  . PEPTIC ULCER DISEASE, HX OF 09/09/2008   Discharge Diagnoses:   Patient Active Problem List   Diagnosis Date Noted  . 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 Severe aortic stenosis 09/17/2017  . Mitral stenosis   . Aftercare following surgery of the circulatory system 03/15/2014  . Carotid stenosis 03/15/2014  . Unstable angina ([place]) 08/31/2013  . Acute myocardial infarction, unspecified site, initial episode of care   . Aortic stenosis   . Atrial fibrillation ([place])   . Vertigo 06/27/2013  . Chest pain with moderate risk of acute coronary syndrome 06/27/2013  . CAD - CABG '94, low risk Myoview May 2014 06/27/2013  . Chronic atrial fibrillation ([place]) 06/27/2013  . Chronic anticoagulation 06/27/2013  .  Gout attack- on steroid dose pack 06/27/2013  . Tachycardia- beta blocker increased 06/26/2013  . Essential hypertension, benign 03/10/2009  . HYPERLIPIDEMIA 09/09/2008  . UNSPECIFIED ANEMIA 09/09/2008  . CAROTID STENOSIS- moderate 09/09/2008  . UNSPECIFIED CEREBROVASCULAR DISEASE 09/09/2008  . RENAL ARTERY STENOSIS- moderate 09/09/2008  . PERIPHERAL VASCULAR DISEASE 09/09/2008  . PEPTIC ULCER DISEASE, HX OF 09/09/2008   Discharged Condition: good  History of Present Illness:  [name] is an 84 year old gentleman with history of hypertension, hyperlipidemia, diabetes, permanent atrial fibrillation on Pradaxa, status post right carotid endarterectomy, status post renal artery stenting, and status post coronary bypass graft surgery x3 by Dr. Servando Snare in 1994. At that time he had a left internal mammary graft to the LAD and a sequential saphenous vein graft to 2 marginal branches. He had a cardiac catheterization in 2015 that showed a severe stenosis of the distal LAD beyond the LIMA insertion site and this was treated with PTCA without complication. He has had a heart murmur for many years and has been followed for moderate aortic stenosis and moderate mitral stenosis. His most recent echo on 07/29/2017 showed a mean aortic valve gradient of 51 mmHg with a peak of 85 mmHg. The peak velocity ratio was 0.2. The mean gradient across the mitral valve was 8 mmHg with a peak of 21 mmHg. Left ventricular ejection fraction was 55-60% with moderate left ventricular hypertrophy. He underwent cardiac catheterization on 08/14/2017 showing severe multivessel coronary disease with chronic total occlusion of all 3 of his native vessels. There was a patent LIMA to the LAD and continued patency of the sequential saphenous vein graft to marginal branches. There is  moderate stenosis of the apical LAD at the previous angioplasty site. There is moderate to severe pulmonary hypertension with a PA pressure of 69/27 with a  mean wedge pressure of 25 mmHg. The LVEDP was 22 mmHg.  It was felt intervention on his Aortic Valve would be indicated.  He was evaluated by Dr. Cyndia Bent at which time the patient reported progressive exertional shortness of breath and fatigue particularly with walking up hills or up stairs. He has no symptoms at rest. He can move around his house without symptoms. He denies any chest discomfort or pain. He denies any dizziness or syncope. He has had some peripheral edema for a long time and wears compression socks. He has been on oxygen at home for the past 6-7 years and uses it regularly at night but only occasionally during the day.  It was felt that Aortic Valve replacement would be indicated.  The patient was felt to be a TAVR candidate.  The risks and benefits of the procedure were explained to the patient and he was agreeable to proceed.  Hospital Course:    [name] presented to [facility] on 09/17/2017.  He was taken to the operating room and underwent Transcatheter Aortic Valve Replacement - Left axillary artery approach.  He tolerated the procedure without difficulty and was taken to the SICU in stable condition. Patient progressed post operatively.  He remained in rate controlled Atrial Fibrillation.  He was restarted on his home regimen of Lopressor and Lotensin.  He was restarted on home Pradaxa prior to discharge.  He developed urinary retention post foley catheter removal.  I/O cath was performed with >700 cc of urine removed.  Unfortunately this did not improved and foley catheter had to be replaced.  The catheter will remain in place at discharge and he will need to follow up with Urology on an outpatient basis.  Follow up post operative Echocardiogram showed the aortic valve to be in proper position with no evidence of regurgitation and perivalvular leak.  He was evaluated by PT who recommended home health PT.  He was progressing well. We did have to place a foley catheter back in  due to urinary retention. We continued to wean his oxygen as tolerated. Today, he is urinating on his own, on room air, and ready for discharge.     Significant Diagnostic Studies: cardiac graphics:   Echocardiogram:   - Left ventricle: The cavity size was normal. Systolic function was   normal. The estimated ejection fraction was in the range of 55%   to 60%. Wall motion was normal; there were no regional wall   motion abnormalities. - Aortic valve: A TAVR bioprosthesis was present. There was no   regurgitation. Peak velocity (S): 219 cm/s. Mean gradient (S): 8   mm Hg. Valve area (VTI): 1.73 cm^2. Valve area (Vmax): 1.36 cm^2.   Valve area (Vmean): 1.57 cm^2. - Mitral valve: Severely calcified annulus. The findings are   consistent with moderate stenosis. Mean gradient (D): 7 mm Hg.   Valve area by pressure half-time: 1.82 cm^2. Valve area by   continuity equation (using LVOT flow): 1.11 cm^2. - Tricuspid valve: There was mild regurgitation.   Treatments: surgery:    Transcatheter Aortic Valve Replacement - Left axillary artery approach             Edwards Sapien 3 THV (size 26 mm, model # 9600TFX, serial # [id number]   Disposition: Discharge disposition: 01-Home or Self Care  Discharge Instructions    Amb Referral to Cardiac Rehabilitation   Complete by:  As directed    Diagnosis:  Valve Replacement   Valve:  Aortic     Allergies as of 09/21/2017      Reactions   Bactrim [sulfamethoxazole-trimethoprim] Other (See Comments)   "does not do anything for me"   Levaquin [levofloxacin In D5w] Diarrhea      Medication List    TAKE these medications   acetaminophen 500 MG tablet Commonly known as:  TYLENOL Take 1,000 mg by mouth every 6 (six) hours as needed for moderate pain or headache.   acyclovir 800 MG tablet Commonly known as:  ZOVIRAX Take 400 mg by mouth 2 (two) times daily.   albuterol 90 MCG/ACT inhaler Commonly known as:   PROVENTIL,VENTOLIN Inhale 2 puffs into the lungs every 4 (four) hours as needed for shortness of breath.   aspirin 81 MG EC tablet Take 1 tablet (81 mg total) by mouth daily. Start taking on:  09/22/2017   atorvastatin 80 MG tablet Commonly known as:  LIPITOR Take 1 tablet (80 mg total) by mouth at bedtime.   benazepril 20 MG tablet Commonly known as:  LOTENSIN Take 10 mg by mouth daily.   brimonidine 0.2 % ophthalmic solution Commonly known as:  ALPHAGAN Place 1 drop into both eyes 2 (two) times daily.   CLARITIN 10 MG tablet Generic drug:  loratadine Take 10 mg by mouth daily.   ferrous sulfate 325 (65 FE) MG tablet Take 325 mg by mouth daily with breakfast.   furosemide 20 MG tablet Commonly known as:  LASIX Take 2 tablets (40 mg total) by mouth daily.   ipratropium-albuterol 0.5-2.5 (3) MG/3ML Soln Commonly known as:  DUONEB Take 3 mLs by nebulization 3 (three) times daily.   lansoprazole 30 MG capsule Commonly known as:  PREVACID Take 30 mg by mouth daily.   metFORMIN 500 MG tablet Commonly known as:  GLUCOPHAGE Take 500 mg by mouth daily with breakfast.   metoprolol tartrate 50 MG tablet Commonly known as:  LOPRESSOR Take 1 tablet (50 mg total) by mouth 2 (two) times daily.   multivitamin with minerals Tabs tablet Take 1 tablet by mouth daily.   multivitamin-lutein Caps capsule Take 1 capsule by mouth daily.   oxyCODONE 5 MG immediate release tablet Commonly known as:  Oxy IR/ROXICODONE Take 1 tablet (5 mg total) by mouth every 4 (four) hours as needed for severe pain.   PRADAXA 150 MG Caps capsule Generic drug:  dabigatran Take 150 mg by mouth 2 (two) times daily.   predniSONE 20 MG tablet Commonly known as:  DELTASONE Take 20-60 mg by mouth daily as needed (for gout attack).   terazosin 5 MG capsule Commonly known as:  HYTRIN Take 5 mg by mouth at bedtime.   trolamine salicylate 10 % cream Commonly known as:  ASPERCREME Apply 1 application  topically 4 (four) times daily as needed for muscle pain.   Vitamin D 2000 units Caps Take 2,000 Units by mouth daily.      [place] Follow up.   Specialty:  [place] Why:  HHPT arranged - they will call you to set up home visits Contact information: [place] [address] [phone]           Signed: Ellwood Handler 09/21/2017, 2:14 PM

## 2017-09-19 NOTE — Progress Notes (Signed)
CARDIAC REHAB PHASE I   PRE:  Rate/Rhythm: 77 afib    BP: sitting 97/51    SaO2: 97 2L  MODE:  Ambulation: 270 ft   POST:  Rate/Rhythm: 87 afib    BP: sitting 102/43     SaO2: 90 2L, 93 RA  Pt somewhat stronger today. C/o bilateral thigh aching in bed but no c/o walking. Used RW and 2L O2. To recliner after walk. SaO2 93 RA sitting after walk but sts he feels better on 2L so turned it on in recliner.  [place], ACSM 09/19/2017 2:09 PM

## 2017-09-20 LAB — GLUCOSE, CAPILLARY
GLUCOSE-CAPILLARY: 139 mg/dL — AB (ref 65–99)
GLUCOSE-CAPILLARY: 179 mg/dL — AB (ref 65–99)
Glucose-Capillary: 140 mg/dL — ABNORMAL HIGH (ref 65–99)
Glucose-Capillary: 146 mg/dL — ABNORMAL HIGH (ref 65–99)

## 2017-09-20 MED ORDER — IPRATROPIUM-ALBUTEROL 0.5-2.5 (3) MG/3ML IN SOLN
3.0000 mL | Freq: Two times a day (BID) | RESPIRATORY_TRACT | Status: DC
  Administered 2017-09-20 – 2017-09-21 (×2): 3 mL via RESPIRATORY_TRACT
  Filled 2017-09-20 (×2): qty 3

## 2017-09-20 MED FILL — Heparin Sodium (Porcine) Inj 1000 Unit/ML: INTRAMUSCULAR | Qty: 30 | Status: AC

## 2017-09-20 MED FILL — Dexmedetomidine HCl in NaCl 0.9% IV Soln 400 MCG/100ML: INTRAVENOUS | Qty: 100 | Status: AC

## 2017-09-20 MED FILL — Potassium Chloride Inj 2 mEq/ML: INTRAVENOUS | Qty: 40 | Status: AC

## 2017-09-20 MED FILL — Magnesium Sulfate Inj 50%: INTRAMUSCULAR | Qty: 10 | Status: AC

## 2017-09-20 NOTE — Progress Notes (Addendum)
      [address]       [place] [address]             [phone]      3 Days Post-Op Procedure(s) (LRB): TRANSCATHETER AORTIC VALVE REPLACEMENT, LEFT SUBCLAVIAN using an Edwards 20mm Sapien 3 Aortic Valve (Left) TRANSESOPHAGEAL ECHOCARDIOGRAM (TEE) (N/A) Subjective: Feels okay this morning. I spoke with patient's family on the phone.   Objective: Vital signs in last 24 hours: Temp:  [97.7 F (36.5 C)-98.8 F (37.1 C)] 97.7 F (36.5 C) (04/19 0350) Pulse Rate:  [69-85] 79 (04/18 2016) Cardiac Rhythm: Atrial fibrillation (04/18 1943) Resp:  [18-20] 19 (04/19 0350) BP: (102-129)/(43-57) 119/55 (04/18 2011) SpO2:  [97 %-99 %] 97 % (04/18 2016)     Intake/Output from previous day: 04/18 0701 - 04/19 0700 In: 600 [P.O.:600] Out: 1250 [Urine:1250] Intake/Output this shift: No intake/output data recorded.  General appearance: alert, cooperative and no distress Heart: irregularly irregular rhythm Lungs: clear to auscultation bilaterally Abdomen: soft, non-tender; bowel sounds normal; no masses,  no organomegaly Extremities: extremities normal, atraumatic, no cyanosis or edema Wound: bilateral groins are soft, non-tender, no hematoma  Lab Results: Recent Labs    09/17/17 1631 09/18/17 0602  WBC  --  10.1  HGB 11.9* 11.6*  HCT 35.0* 36.6*  PLT  --  168   BMET:  Recent Labs    09/17/17 1532 09/17/17 1631 09/18/17 0602  NA 140 143 137  K 3.2* 3.6 4.0  CL 103  --  106  CO2  --   --  25  GLUCOSE 163* 163* 151*  BUN 9  --  8  CREATININE 0.70  --  1.02  CALCIUM  --   --  8.4*    PT/INR: No results for input(s): LABPROT, INR in the last 72 hours. ABG    Component Value Date/Time   PHART 7.394 09/17/2017 1225   HCO3 27.9 09/17/2017 1225   TCO2 26 09/17/2017 1532   ACIDBASEDEF TEST WILL BE CREDITED 09/13/2017 1049   O2SAT 95.0 09/17/2017 1225   CBG (last 3)  Recent Labs    09/19/17 1607 09/19/17 2118 09/20/17 0559  GLUCAP 135* 148*  139*    Assessment/Plan: S/P Procedure(s) (LRB): TRANSCATHETER AORTIC VALVE REPLACEMENT, LEFT SUBCLAVIAN using an Edwards 1mm Sapien 3 Aortic Valve (Left) TRANSESOPHAGEAL ECHOCARDIOGRAM (TEE) (N/A)  1. Rate controlled atrial fibrillation. Rate in the 70s-80s. Continue Lopressor, Lotensin, and Pradaxa.  2. GU-urinary retention, on home Hytrin. Foley cath is back in and we will try a voiding trial tomorrow. Outpatient follow-up with Urology. Dr. Cyndia Bent would prefer the patient not leaving the hospital with a catheter due to risk of infection with a fresh valve. 3. DM- sugars have been well controlled.   Plan: Continue foley today. Ambulate. Wean oxygen as tolerated. Encourage use of incentive spirometer. Voiding trial tomorrow.    LOS: 3 days    Elgie Collard 09/20/2017   Patient s/p TVAR , Cabg by me in 1994 Now with urinary retention Currently on Hytrin 5 mg daily Voiding trial tomorrow  I have seen and examined [name] and agree with the above assessment  and plan.  Grace Isaac MD [place] [phone] Office [phone] 09/20/2017 1:37 PM

## 2017-09-20 NOTE — Progress Notes (Signed)
PT Cancellation Note  Patient Details Name: [name] MRN: [id number] DOB: 1931-11-07   Cancelled Treatment:    Reason Eval/Treat Not Completed: Other (comment). Attempted x 2 this PM. Both times pt had just returned from ambulating.   [place] 09/20/2017, 4:08 PM [employer] PT [phone]

## 2017-09-20 NOTE — Progress Notes (Signed)
CARDIAC REHAB PHASE I   PRE:  Rate/Rhythm: 82 afib    BP: sitting 116/50    SaO2: 96 2L  MODE:  Ambulation: 400 ft   POST:  Rate/Rhythm: 100 afib    BP: sitting 133/63     SaO2: 89 2L  Tolerated well with RW. Rest x2. Arm began leaking so returned to room. Doesn't like chair. VSS. Practiced IS. This was second walk today 1438- [place], [place] 09/20/2017 3:20 PM

## 2017-09-20 NOTE — Plan of Care (Signed)
  Problem: Health Behavior/Discharge Planning: Goal: Ability to manage health-related needs will improve Outcome: Progressing   Problem: Clinical Measurements: Goal: Ability to maintain clinical measurements within normal limits will improve Outcome: Progressing   

## 2017-09-21 DIAGNOSIS — Z952 Presence of prosthetic heart valve: Secondary | ICD-10-CM

## 2017-09-21 LAB — GLUCOSE, CAPILLARY
GLUCOSE-CAPILLARY: 131 mg/dL — AB (ref 65–99)
Glucose-Capillary: 155 mg/dL — ABNORMAL HIGH (ref 65–99)

## 2017-09-21 MED ORDER — LACTULOSE 10 GM/15ML PO SOLN
20.0000 g | Freq: Once | ORAL | Status: AC
  Administered 2017-09-21: 20 g via ORAL
  Filled 2017-09-21: qty 30

## 2017-09-21 MED ORDER — ASPIRIN 81 MG PO TBEC: 81.0000 mg | DELAYED_RELEASE_TABLET | Freq: Every day | ORAL | Status: DC

## 2017-09-21 MED ORDER — OXYCODONE HCL 5 MG PO TABS: 5.0000 mg | ORAL_TABLET | ORAL | 0 refills | Status: DC | PRN

## 2017-09-21 NOTE — Progress Notes (Addendum)
      [address]       [place] [address]             [phone]      4 Days Post-Op Procedure(s) (LRB): TRANSCATHETER AORTIC VALVE REPLACEMENT, LEFT SUBCLAVIAN using an Edwards 71mm Sapien 3 Aortic Valve (Left) TRANSESOPHAGEAL ECHOCARDIOGRAM (TEE) (N/A)   Subjective:   States he is doing terribly because he is still stuck here.  He wants to go home.    Objective: Vital signs in last 24 hours: Temp:  [98.1 F (36.7 C)-98.8 F (37.1 C)] 98.1 F (36.7 C) (04/20 0904) Pulse Rate:  [68-96] 82 (04/20 0904) Cardiac Rhythm: Atrial fibrillation (04/20 0800) Resp:  [16-18] 16 (04/20 0904) BP: (104-121)/(55-81) 105/81 (04/20 0904) SpO2:  [97 %-98 %] 98 % (04/20 0918)  Intake/Output from previous day: 04/19 0701 - 04/20 0700 In: 120 [P.O.:120] Out: 2035 [Urine:2035]  General appearance: alert, cooperative and no distress Heart: irregularly irregular rhythm Lungs: clear to auscultation bilaterally Abdomen: soft, non-tender; bowel sounds normal; no masses,  no organomegaly Wound: clean and dry  Lab Results: No results for input(s): WBC, HGB, HCT, PLT in the last 72 hours. BMET: No results for input(s): NA, K, CL, CO2, GLUCOSE, BUN, CREATININE, CALCIUM in the last 72 hours.  PT/INR: No results for input(s): LABPROT, INR in the last 72 hours. ABG    Component Value Date/Time   PHART 7.394 09/17/2017 1225   HCO3 27.9 09/17/2017 1225   TCO2 26 09/17/2017 1532   ACIDBASEDEF TEST WILL BE CREDITED 09/13/2017 1049   O2SAT 95.0 09/17/2017 1225   CBG (last 3)  Recent Labs    09/20/17 1639 09/20/17 2120 09/21/17 0613  GLUCAP 140* 146* 131*    Assessment/Plan: S/P Procedure(s) (LRB): TRANSCATHETER AORTIC VALVE REPLACEMENT, LEFT SUBCLAVIAN using an Edwards 5mm Sapien 3 Aortic Valve (Left) TRANSESOPHAGEAL ECHOCARDIOGRAM (TEE) (N/A)  1. CV- Chronic A. Fib- rate controlled, continue Lopressor, Lotensin, Pradaxa 2. GU- Urinary retention, foley catheter  removed this morning.. Continue Hytrin 3. DM- sugars remain controlled 4. Dispo- patient stable, catheter again removed today... If he is able to void he can be discharged home later today   LOS: 4 days    Ellwood Handler 09/21/2017  I have seen and examined the patient and agree with the assessment and plan as outlined.  Successfully voiding urine since Foley removed.  D/C home  Rexene Alberts, MD 09/21/2017 3:07 PM

## 2017-09-21 NOTE — Progress Notes (Signed)
    Subjective:  Anxious to go home but has had urinary retention Foley out   Objective:  Vitals:   09/20/17 2017 09/21/17 0550 09/21/17 0904 09/21/17 0918  BP:  (!) 104/55 105/81   Pulse: 78 68 82   Resp: 18 18 16    Temp:  98.6 F (37 C) 98.1 F (36.7 C)   TempSrc:  Oral Oral   SpO2: 97% 97% 98% 98%  Weight:      Height:        Intake/Output from previous day:  Intake/Output Summary (Last 24 hours) at 09/21/2017 1214 Last data filed at 09/21/2017 0553 Gross per 24 hour  Intake -  Output 1360 ml  Net -1360 ml    Physical Exam: Affect appropriate Healthy:  appears stated age HEENT: normal Neck supple with no adenopathy JVP normal no bruits no thyromegaly Lungs clear with no wheezing and good diaphragmatic motion Heart:  S1/S2 SEM no AR  murmur, no rub, gallop or click PMI normal Post left axillary cut down  Abdomen: benighn, BS positve, no tenderness, no AAA no bruit.  No HSM or HJR Distal pulses intact with no bruits No edema Neuro non-focal Skin warm and dry No muscular weakness    Imaging: No results found.  Cardiac Studies:  ECG: afib nonspecific ST changes    Telemetry: afib rate controlled   Echo: EF 55-60% mean gradient across TAVR 26 mm Sapien valve 8 mmHg no peri valvular regurgitation  Medications:   . acyclovir  400 mg Oral BID  . aspirin EC  81 mg Oral Daily  . atorvastatin  80 mg Oral QHS  . benazepril  10 mg Oral Daily  . brimonidine  1 drop Both Eyes BID  . cholecalciferol  2,000 Units Oral Daily  . dabigatran  150 mg Oral Q12H  . ferrous sulfate  325 mg Oral Q breakfast  . furosemide  40 mg Oral Daily  . insulin aspart  0-15 Units Subcutaneous TID WC  . ipratropium-albuterol  3 mL Nebulization BID  . lactulose  20 g Oral Once  . loratadine  10 mg Oral Daily  . metoprolol tartrate  50 mg Oral BID  . multivitamin  1 tablet Oral Daily  . pantoprazole  40 mg Oral Daily  . terazosin  5 mg Oral Daily      Assessment/Plan:    TAVR:  Right axillary sight ok post procedure echo good mean gradient 8 mmHg no AR SBE Afib: chronic rate control fine back on pradaxa HTN:  Continue ARB and diuretic Bladder: foley out d/c home if able to urinate on terazosin   Jenkins [place] 09/21/2017, 12:14 PM

## 2017-09-21 NOTE — Progress Notes (Addendum)
Cardiac Rehab Phase I [phone] Patient declined to walk at this time, states just had catheter removed ~10 minutes prior. Encouraged to walk later with nursing staff when ready. Reviewed discharge education including IS use, restrictions, risk factors, and activity progression. Heart Healthy/Diabetic diet handout given. Patient states wife is a Marine scientist, and he follows a heart healthy diet. Exercise guidelines given including temperature precautions and endpoints for exercise. Pt verbalizes understanding of instructions given. Discussed Phase 2 cardiac rehab, and pt is interested in participating in the program at [facility]. Referral will be sent for Phase 2 CR.  Sol Passer, MS, ACSM CEP

## 2017-09-21 NOTE — Progress Notes (Signed)
Patient voided 375 ml clear yellow urine without difficulty.  Will notify PA.

## 2017-09-24 DIAGNOSIS — J440 Chronic obstructive pulmonary disease with acute lower respiratory infection: Secondary | ICD-10-CM | POA: Diagnosis not present

## 2017-09-24 DIAGNOSIS — Z6831 Body mass index (BMI) 31.0-31.9, adult: Secondary | ICD-10-CM | POA: Diagnosis not present

## 2017-09-25 ENCOUNTER — Telehealth (HOSPITAL_COMMUNITY): Payer: Self-pay

## 2017-09-25 NOTE — Telephone Encounter (Signed)
Called and spoke with wife of patient to see if he is interested in the Cardiac Rehab Program. Wife stated he is not interested. She stated it's hard for her to get him in and out of the car. He will be receiving HHPT and she stated she can do the rest at home. Closed referral.

## 2017-09-25 NOTE — Telephone Encounter (Signed)
Patients insurance is active and benefits verified through Medicare A/B - No co-pay, deductible amount of $185.00/$185.00 has been met, no out of pocket, 20% co-insurance, and no pre-authorization is required. Passport/reference [phone]  Will contact patient to see if he is interested in the Cardiac Rehab Program. If interested, patient will need to complete follow up appt. Once completed, patient will be contacted for scheduling upon review by the RN Navigator.

## 2017-09-26 ENCOUNTER — Encounter: Payer: Self-pay | Admitting: Adult Health

## 2017-09-26 ENCOUNTER — Ambulatory Visit (INDEPENDENT_AMBULATORY_CARE_PROVIDER_SITE_OTHER): Payer: Medicare Other | Admitting: Adult Health

## 2017-09-26 VITALS — BP 131/61 | HR 83 | Ht 72.0 in | Wt 224.0 lb

## 2017-09-26 DIAGNOSIS — N5089 Other specified disorders of the male genital organs: Secondary | ICD-10-CM

## 2017-09-26 DIAGNOSIS — I2583 Coronary atherosclerosis due to lipid rich plaque: Secondary | ICD-10-CM

## 2017-09-26 DIAGNOSIS — I2510 Atherosclerotic heart disease of native coronary artery without angina pectoris: Secondary | ICD-10-CM

## 2017-09-26 DIAGNOSIS — I482 Chronic atrial fibrillation: Secondary | ICD-10-CM

## 2017-09-26 DIAGNOSIS — I4821 Permanent atrial fibrillation: Secondary | ICD-10-CM

## 2017-09-26 DIAGNOSIS — N4829 Other inflammatory disorders of penis: Secondary | ICD-10-CM

## 2017-09-26 DIAGNOSIS — Z952 Presence of prosthetic heart valve: Secondary | ICD-10-CM

## 2017-09-26 NOTE — Patient Instructions (Signed)
Medication Instructions:  NO CHANGES- Your physician recommends that you continue on your current medications as directed. Please refer to the Current Medication list given to you today.  If you need a refill on your cardiac medications before your next appointment, please call your pharmacy.  Special Instructions: Referral to urology  Follow-Up: Your physician wants you to follow-up in: keep scheduled appointments   Thank you for choosing [facility]!!

## 2017-09-26 NOTE — Progress Notes (Signed)
Cardiology Office Note   Date:  09/26/2017   ID:  [name], DOB May 31, 1932, MRN [id number]  PCP:  [name] Dress, MD  Cardiologist: Dr. Stanford [name] No chief complaint on file.    History of Present Illness: [name] is a 84 y.o. male who presents for posthospitalization follow-up after admission with severe aortic valve stenosis status post TAVR by Dr. Burt [name] and Dr. Mohammed [name] on 09/17/2017.. Other history includes coronary artery disease, history of multivessel coronary artery bypass grafting in 1994 (LIMA to LAD, SVG sequential graft to OM to OM 3 vessels), repeat catheterization in 2015 demonstrated continued graft patency but had severe stenosis of the ostial LAD be on the LIMA insertion site.  This was treated with a PTCA at that time through the LIMA graft.  The patient also has a history of atrial fibrillation and is on Pradaxa, along with hypertension.  The patient was discharged on 09/21/2017.  He was to continue Lopressor, Lotensin, and Pradaxa.  He comes today with multiple complaints. He has had recent treatment for bronchitis and is currently on amoxicillin. He remains an O2 and is being weaned from this.   He is also complaining of pain in his penis,under the foreskin with swelling of his testicles. He has generalized fatigue.  Has not yet gotten his strength back.  He is also being followed by [facility] home health nursing agency and have been awaiting their arrival.  He denies bleeding or severe pain at left upper chest incision site, or bilateral groins after having procedure.  Of note, also, they were told that he had an abnormal CT scan of the abdomen while he was recently hospitalized.  This showed a mass that was uncertain to be metastatic or not.  They have follow-up appointments with other physicians for further investigation and recommendations.  Past Medical History:  Diagnosis Date  . Acute myocardial infarction, unspecified site, initial episode of care     . Anemia   . Anxiety   . Aortic stenosis    . Atrial fibrillation ([place])    a. permanent, on Coumadin for anticoagulation  . CAD (coronary artery disease)    a. s/p CABG in 1994 b. cath in 2015 showing normal LM, 100% LCx and RCA stenosis with 50-60% prox LAD stenosis and 100% mid-LAD stenosis; patent sequential SVG-OM2-OM3 and patent LIMA-LAD with PTCA of distal LAD via LIMA graft performed at that time  . Carotid stenosis   . CHF (congestive heart failure) ([place])   . COPD (chronic obstructive pulmonary disease) ([place])   . Depression   . Diabetes mellitus without complication ([place])    FASTING 98-120S  . GERD (gastroesophageal reflux disease)   . History of blood transfusion   . History of peptic ulcer disease   . Hyperlipidemia   . Hypertension   . Myocardial infarction ([place])    X2  . Peripheral vascular disease ([place])   . Pneumonia    HX OF  . Renal artery stenosis ([place])   . Sleep apnea    OXYGEN AT NIGHT 2L [place]    Past Surgical History:  Procedure Laterality Date  . AORTIC ARCH ANGIOGRAPHY N/A 08/14/2017   Procedure: AORTIC ARCH ANGIOGRAPHY;  Surgeon: Sherren Mocha, MD;  Location: [facility];  Service: Cardiovascular;  Laterality: N/A;  . CARDIAC CATHETERIZATION    . CAROTID ENDARTERECTOMY    . COLON SURGERY    . CORONARY ARTERY BYPASS GRAFT  1994  . ENDARTERECTOMY Right 01/05/2013  Procedure: ENDARTERECTOMY CAROTID;  Surgeon: Rosetta Posner, MD;  Location: [facility];  Service: Vascular;  Laterality: Right;  . KNEE SURGERY  08/2003   left knee/ARTHROSCOPIC  . LEFT HEART CATHETERIZATION WITH CORONARY/GRAFT ANGIOGRAM N/A 09/01/2013   Procedure: LEFT HEART CATHETERIZATION WITH Beatrix Fetters;  Surgeon: Sinclair Grooms, MD;  Location[facility] CATH LAB;  Service: Cardiovascular;  Laterality: N/A;  . PATCH ANGIOPLASTY Right 01/05/2013   Procedure: PATCH ANGIOPLASTY;  Surgeon: Rosetta Posner, MD;  Location: [facility];  Service: Vascular;  Laterality: Right;  . PERCUTANEOUS  CORONARY STENT INTERVENTION (PCI-S)  09/01/2013   Procedure: PERCUTANEOUS CORONARY STENT INTERVENTION (PCI-S);  Surgeon: Sinclair Grooms, MD;  Location: [facility] CATH LAB;  Service: Cardiovascular;;  . removal of bleeding ulcer  1994  . RENAL ARTERY STENT     stenting of the left renal artery as well as a cutting balloon angioplasty for treatment of in-stent restenosis coronary artery bypass grafting in 1994.  [name] RIGHT/LEFT HEART CATH AND CORONARY/GRAFT ANGIOGRAPHY N/A 08/14/2017   Procedure: RIGHT/LEFT HEART CATH AND CORONARY/GRAFT ANGIOGRAPHY;  Surgeon: Sherren Mocha, MD;  Location: [facility];  Service: Cardiovascular;  Laterality: N/A;  . TEE WITHOUT CARDIOVERSION N/A 09/17/2017   Procedure: TRANSESOPHAGEAL ECHOCARDIOGRAM (TEE);  Surgeon: Sherren Mocha, MD;  Location: [facility];  Service: Open Heart Surgery;  Laterality: N/A;     Current Outpatient Medications  Medication Sig Dispense Refill  . acetaminophen (TYLENOL) 500 MG tablet Take 1,000 mg by mouth every 6 (six) hours as needed for moderate pain or headache.     [name] acyclovir (ZOVIRAX) 800 MG tablet Take 400 mg by mouth 2 (two) times daily.     [name] albuterol (PROVENTIL,VENTOLIN) 90 MCG/ACT inhaler Inhale 2 puffs into the lungs every 4 (four) hours as needed for shortness of breath.     [name] aspirin EC 81 MG EC tablet Take 1 tablet (81 mg total) by mouth daily.    [name] atorvastatin (LIPITOR) 80 MG tablet Take 1 tablet (80 mg total) by mouth at bedtime. 90 tablet 3  . benazepril (LOTENSIN) 20 MG tablet Take 10 mg by mouth daily.    . brimonidine (ALPHAGAN) 0.2 % ophthalmic solution Place 1 drop into both eyes 2 (two) times daily.    . Cholecalciferol (VITAMIN D) 2000 UNITS CAPS Take 2,000 Units by mouth daily.    [name] CLARITIN 10 MG tablet Take 10 mg by mouth daily.    . dabigatran (PRADAXA) 150 MG CAPS Take 150 mg by mouth 2 (two) times daily.     . ferrous sulfate 325 (65 FE) MG tablet Take 325 mg by mouth daily with breakfast.     . furosemide  (LASIX) 20 MG tablet Take 2 tablets (40 mg total) by mouth daily. 30 tablet 5  . ipratropium-albuterol (DUONEB) 0.5-2.5 (3) MG/3ML SOLN Take 3 mLs by nebulization 3 (three) times daily.     . lansoprazole (PREVACID) 30 MG capsule Take 30 mg by mouth daily.      . metFORMIN (GLUCOPHAGE) 500 MG tablet Take 500 mg by mouth daily with breakfast.    . metoprolol (LOPRESSOR) 50 MG tablet Take 1 tablet (50 mg total) by mouth 2 (two) times daily. 180 tablet 4  . Multiple Vitamin (MULTIVITAMIN WITH MINERALS) TABS Take 1 tablet by mouth daily.    . multivitamin-lutein (OCUVITE-LUTEIN) CAPS Take 1 capsule by mouth daily.    [name] oxyCODONE (OXY IR/ROXICODONE) 5 MG immediate release tablet Take 1 tablet (5 mg total)  by mouth every 4 (four) hours as needed for severe pain. 30 tablet 0  . predniSONE (DELTASONE) 20 MG tablet Take 20-60 mg by mouth daily as needed (for gout attack).     . terazosin (HYTRIN) 5 MG capsule Take 5 mg by mouth at bedtime.     . trolamine salicylate (ASPERCREME) 10 % cream Apply 1 application topically 4 (four) times daily as needed for muscle pain.      No current facility-administered medications for this visit.     Allergies:   Bactrim [sulfamethoxazole-trimethoprim] and Levaquin [levofloxacin in d5w]    Social History:  The patient  reports that he quit smoking about 24 years ago. He has never used smokeless tobacco. He reports that he does not drink alcohol or use drugs.   Family History:  The patient's family history includes Coronary artery disease in his father, mother, and unknown relative; Heart attack in his father and mother; Heart disease in his brother, father, mother, sister, and unknown relative; Hyperlipidemia in his mother; Hypertension in his mother.    ROS: All other systems are reviewed and negative. Unless otherwise mentioned in H&P    PHYSICAL EXAM: VS:  There were no vitals taken for this visit. , BMI There is no height or weight on file to calculate  BMI. GEN: Well nourished, well developed, in no acute distress  HEENT: normal  Neck: no JVD, carotid bruits, or masses Cardiac: RRR; no murmurs, rubs, or gallops,no edema  Respiratory:  Clear to auscultation bilaterally, normal work of breathing GI: soft, nontender, nondistended, + BS MS: no deformity or atrophy  Skin: warm and dry, no rash Neuro:  Strength and sensation are intact Psych: euthymic mood, full affect   EKG: Not completed during this office visit.   Recent Labs: 09/13/2017: ALT 23; B Natriuretic Peptide 245.9 09/18/2017: BUN 8; Creatinine, Ser 1.02; Hemoglobin 11.6; Magnesium 1.9; Platelets 168; Potassium 4.0; Sodium 137    Lipid Panel    Component Value Date/Time   CHOL 142 11/17/2012 0909   TRIG 90.0 11/17/2012 0909   HDL 46.80 11/17/2012 0909   CHOLHDL 3 11/17/2012 0909   VLDL 18.0 11/17/2012 0909   LDLCALC 77 11/17/2012 0909   LDLDIRECT 67.6 09/08/2009 0000      Wt Readings from Last 3 Encounters:  09/17/17 223 lb 4.8 oz (101.3 kg)  09/13/17 223 lb 4.8 oz (101.3 kg)  09/09/17 224 lb 3.2 oz (101.7 kg)    Other studies Reviewed:  Echocardiogram 09/18/2017  Left ventricle: The cavity size was normal. Systolic function was   normal. The estimated ejection fraction was in the range of 55%   to 60%. Wall motion was normal; there were no regional wall   motion abnormalities. - Aortic valve: A TAVR bioprosthesis was present. There was no   regurgitation. Peak velocity (S): 219 cm/s. Mean gradient (S): 8   mm Hg. Valve area (VTI): 1.73 cm^2. Valve area (Vmax): 1.36 cm^2.   Valve area (Vmean): 1.57 cm^2. - Mitral valve: Severely calcified annulus. The findings are   consistent with moderate stenosis. Mean gradient (D): 7 mm Hg.   Valve area by pressure half-time: 1.82 cm^2. Valve area by   continuity equation (using LVOT flow): 1.11 cm^2. - Tricuspid valve: There was mild regurgitation.  CT of abdomen IMPRESSION: 1. Vascular findings and measurements  pertinent to potential TAVR procedure, as detailed above. 2. Thickening calcification of the aortic valve, compatible with the reported clinical history of severe aortic stenosis. 3. Possible ulcerated  enhancing gastric mass along the proximal lesser curvature of the stomach, as discussed above. Further evaluation with nonemergent endoscopy is recommended in the near future to exclude the possibility of gastric neoplasm and/or ulcer. 4. Calcifications of the mitral valve and mitral annulus. 5. Aortic atherosclerosis, in addition to left main and 3 vessel coronary artery disease. Status post median sternotomy for CABG including LIMA to the LAD. 6. Small bilateral pleural effusions lying dependently. 7. Additional incidental findings, as above.  ASSESSMENT AND PLAN:  1. Severe Aortic Valve Disease: The patient has recovered well from TAVR procedure.  He states that his breathing is improved and he is weaning off of oxygen.  I examined all of his dressings and there is no evidence of bleeding hematoma or infection.  The left groin site does have a continued open area in the subcutaneous depth.  No active bleeding.  We will redress this site.  We will place a Tegaderm nonstick dressing over the left upper chest incision site.  He is to follow-up in 1 month with [name] Form, PA with the [facility].  2. Swelling of the penis and foreskin on the left lateral side, with testicular edema:  This is not causing dysuria, there is no hematuria, patient has noticed worsening edema during micturition, and also by the end of the day. He states that the localized edema is painful to touch. Meatus is not edematous and I am not seeing stricture of the penis from the localized edema of the foreskin, and possible penius shaft.   He has noticed increased edema in his testicles as well., I have had Dr. Ellyn Hack, who is attending in the office today, to also exam him., We have decided that he will need  to be seen by Urologist sooner than later, but not emergently to have this examined. It is noted that during his recent hospitalization he has urinary retention and had frequent difficult foley catheter insertions.    I have personally called [facility] and after being transferred to nurse to be scheduled, was put on lengthy hold an did speak to them to schedule appointment as no one answered. Will send faxed referral for ASAP appointment.   3. CAD:Hx as above with CABG. No changes in his regimen for now.   4. Atrial Fib: Heart rate is controlled. He continues on ASA. Metoprolol, and Pradaxa.    Current medicines are reviewed at length with the patient today.  Dressings changed by nurse, patient was also seen by Dr. Ellyn Hack.   Labs/ tests ordered today include: None   Phill Myron. West Pugh, ANP, AACC   09/26/2017 7:25 AM    [facility] [address], [place], [place] [address] Phone: [phone]; Fax: [phone]

## 2017-09-27 DIAGNOSIS — I110 Hypertensive heart disease with heart failure: Secondary | ICD-10-CM | POA: Diagnosis not present

## 2017-09-27 DIAGNOSIS — E1151 Type 2 diabetes mellitus with diabetic peripheral angiopathy without gangrene: Secondary | ICD-10-CM | POA: Diagnosis not present

## 2017-09-27 DIAGNOSIS — F419 Anxiety disorder, unspecified: Secondary | ICD-10-CM | POA: Diagnosis not present

## 2017-09-27 DIAGNOSIS — D649 Anemia, unspecified: Secondary | ICD-10-CM | POA: Diagnosis not present

## 2017-09-27 DIAGNOSIS — K219 Gastro-esophageal reflux disease without esophagitis: Secondary | ICD-10-CM | POA: Diagnosis not present

## 2017-09-27 DIAGNOSIS — I252 Old myocardial infarction: Secondary | ICD-10-CM | POA: Diagnosis not present

## 2017-09-27 DIAGNOSIS — M109 Gout, unspecified: Secondary | ICD-10-CM | POA: Diagnosis not present

## 2017-09-27 DIAGNOSIS — Z951 Presence of aortocoronary bypass graft: Secondary | ICD-10-CM | POA: Diagnosis not present

## 2017-09-27 DIAGNOSIS — J449 Chronic obstructive pulmonary disease, unspecified: Secondary | ICD-10-CM | POA: Diagnosis not present

## 2017-09-27 DIAGNOSIS — I2510 Atherosclerotic heart disease of native coronary artery without angina pectoris: Secondary | ICD-10-CM | POA: Diagnosis not present

## 2017-09-27 DIAGNOSIS — Z952 Presence of prosthetic heart valve: Secondary | ICD-10-CM | POA: Diagnosis not present

## 2017-09-27 DIAGNOSIS — Z48812 Encounter for surgical aftercare following surgery on the circulatory system: Secondary | ICD-10-CM | POA: Diagnosis not present

## 2017-09-27 DIAGNOSIS — I5032 Chronic diastolic (congestive) heart failure: Secondary | ICD-10-CM | POA: Diagnosis not present

## 2017-09-27 DIAGNOSIS — Z87891 Personal history of nicotine dependence: Secondary | ICD-10-CM | POA: Diagnosis not present

## 2017-09-27 DIAGNOSIS — I482 Chronic atrial fibrillation: Secondary | ICD-10-CM | POA: Diagnosis not present

## 2017-09-27 DIAGNOSIS — Z9181 History of falling: Secondary | ICD-10-CM | POA: Diagnosis not present

## 2017-09-27 DIAGNOSIS — G4700 Insomnia, unspecified: Secondary | ICD-10-CM | POA: Diagnosis not present

## 2017-09-27 DIAGNOSIS — F329 Major depressive disorder, single episode, unspecified: Secondary | ICD-10-CM | POA: Diagnosis not present

## 2017-09-27 DIAGNOSIS — E785 Hyperlipidemia, unspecified: Secondary | ICD-10-CM | POA: Diagnosis not present

## 2017-09-30 ENCOUNTER — Telehealth: Payer: Self-pay | Admitting: Adult Health

## 2017-09-30 NOTE — Telephone Encounter (Signed)
Called Dr. Purvis Sheffield office.  The patient has an appointment on 10-02-17.

## 2017-10-02 DIAGNOSIS — N5089 Other specified disorders of the male genital organs: Secondary | ICD-10-CM | POA: Diagnosis not present

## 2017-10-02 DIAGNOSIS — N472 Paraphimosis: Secondary | ICD-10-CM | POA: Diagnosis not present

## 2017-10-03 DIAGNOSIS — Z48812 Encounter for surgical aftercare following surgery on the circulatory system: Secondary | ICD-10-CM | POA: Diagnosis not present

## 2017-10-03 DIAGNOSIS — I2510 Atherosclerotic heart disease of native coronary artery without angina pectoris: Secondary | ICD-10-CM | POA: Diagnosis not present

## 2017-10-03 DIAGNOSIS — I5032 Chronic diastolic (congestive) heart failure: Secondary | ICD-10-CM | POA: Diagnosis not present

## 2017-10-03 DIAGNOSIS — E1151 Type 2 diabetes mellitus with diabetic peripheral angiopathy without gangrene: Secondary | ICD-10-CM | POA: Diagnosis not present

## 2017-10-03 DIAGNOSIS — I110 Hypertensive heart disease with heart failure: Secondary | ICD-10-CM | POA: Diagnosis not present

## 2017-10-03 DIAGNOSIS — D649 Anemia, unspecified: Secondary | ICD-10-CM | POA: Diagnosis not present

## 2017-10-07 DIAGNOSIS — E1151 Type 2 diabetes mellitus with diabetic peripheral angiopathy without gangrene: Secondary | ICD-10-CM | POA: Diagnosis not present

## 2017-10-07 DIAGNOSIS — I2510 Atherosclerotic heart disease of native coronary artery without angina pectoris: Secondary | ICD-10-CM | POA: Diagnosis not present

## 2017-10-07 DIAGNOSIS — D649 Anemia, unspecified: Secondary | ICD-10-CM | POA: Diagnosis not present

## 2017-10-07 DIAGNOSIS — Z48812 Encounter for surgical aftercare following surgery on the circulatory system: Secondary | ICD-10-CM | POA: Diagnosis not present

## 2017-10-07 DIAGNOSIS — I110 Hypertensive heart disease with heart failure: Secondary | ICD-10-CM | POA: Diagnosis not present

## 2017-10-07 DIAGNOSIS — I5032 Chronic diastolic (congestive) heart failure: Secondary | ICD-10-CM | POA: Diagnosis not present

## 2017-10-08 DIAGNOSIS — M109 Gout, unspecified: Secondary | ICD-10-CM | POA: Diagnosis not present

## 2017-10-08 DIAGNOSIS — Z1331 Encounter for screening for depression: Secondary | ICD-10-CM | POA: Diagnosis not present

## 2017-10-08 DIAGNOSIS — E1142 Type 2 diabetes mellitus with diabetic polyneuropathy: Secondary | ICD-10-CM | POA: Diagnosis not present

## 2017-10-08 DIAGNOSIS — Z9181 History of falling: Secondary | ICD-10-CM | POA: Diagnosis not present

## 2017-10-08 DIAGNOSIS — I2510 Atherosclerotic heart disease of native coronary artery without angina pectoris: Secondary | ICD-10-CM | POA: Diagnosis not present

## 2017-10-08 DIAGNOSIS — Z1389 Encounter for screening for other disorder: Secondary | ICD-10-CM | POA: Diagnosis not present

## 2017-10-08 DIAGNOSIS — E785 Hyperlipidemia, unspecified: Secondary | ICD-10-CM | POA: Diagnosis not present

## 2017-10-08 DIAGNOSIS — D509 Iron deficiency anemia, unspecified: Secondary | ICD-10-CM | POA: Diagnosis not present

## 2017-10-09 DIAGNOSIS — D649 Anemia, unspecified: Secondary | ICD-10-CM | POA: Diagnosis not present

## 2017-10-09 DIAGNOSIS — I110 Hypertensive heart disease with heart failure: Secondary | ICD-10-CM | POA: Diagnosis not present

## 2017-10-09 DIAGNOSIS — Z48812 Encounter for surgical aftercare following surgery on the circulatory system: Secondary | ICD-10-CM | POA: Diagnosis not present

## 2017-10-09 DIAGNOSIS — E1151 Type 2 diabetes mellitus with diabetic peripheral angiopathy without gangrene: Secondary | ICD-10-CM | POA: Diagnosis not present

## 2017-10-09 DIAGNOSIS — I2510 Atherosclerotic heart disease of native coronary artery without angina pectoris: Secondary | ICD-10-CM | POA: Diagnosis not present

## 2017-10-09 DIAGNOSIS — I5032 Chronic diastolic (congestive) heart failure: Secondary | ICD-10-CM | POA: Diagnosis not present

## 2017-10-14 DIAGNOSIS — D649 Anemia, unspecified: Secondary | ICD-10-CM | POA: Diagnosis not present

## 2017-10-14 DIAGNOSIS — I5032 Chronic diastolic (congestive) heart failure: Secondary | ICD-10-CM | POA: Diagnosis not present

## 2017-10-14 DIAGNOSIS — I110 Hypertensive heart disease with heart failure: Secondary | ICD-10-CM | POA: Diagnosis not present

## 2017-10-14 DIAGNOSIS — E1151 Type 2 diabetes mellitus with diabetic peripheral angiopathy without gangrene: Secondary | ICD-10-CM | POA: Diagnosis not present

## 2017-10-14 DIAGNOSIS — Z48812 Encounter for surgical aftercare following surgery on the circulatory system: Secondary | ICD-10-CM | POA: Diagnosis not present

## 2017-10-14 DIAGNOSIS — I2510 Atherosclerotic heart disease of native coronary artery without angina pectoris: Secondary | ICD-10-CM | POA: Diagnosis not present

## 2017-10-16 DIAGNOSIS — H211X1 Other vascular disorders of iris and ciliary body, right eye: Secondary | ICD-10-CM | POA: Diagnosis not present

## 2017-10-16 DIAGNOSIS — H34811 Central retinal vein occlusion, right eye, with macular edema: Secondary | ICD-10-CM | POA: Diagnosis not present

## 2017-10-16 DIAGNOSIS — H4051X1 Glaucoma secondary to other eye disorders, right eye, mild stage: Secondary | ICD-10-CM | POA: Diagnosis not present

## 2017-10-16 DIAGNOSIS — H3582 Retinal ischemia: Secondary | ICD-10-CM | POA: Diagnosis not present

## 2017-10-18 DIAGNOSIS — I110 Hypertensive heart disease with heart failure: Secondary | ICD-10-CM | POA: Diagnosis not present

## 2017-10-18 DIAGNOSIS — Z48812 Encounter for surgical aftercare following surgery on the circulatory system: Secondary | ICD-10-CM | POA: Diagnosis not present

## 2017-10-18 DIAGNOSIS — I2510 Atherosclerotic heart disease of native coronary artery without angina pectoris: Secondary | ICD-10-CM | POA: Diagnosis not present

## 2017-10-18 DIAGNOSIS — E1151 Type 2 diabetes mellitus with diabetic peripheral angiopathy without gangrene: Secondary | ICD-10-CM | POA: Diagnosis not present

## 2017-10-18 DIAGNOSIS — I5032 Chronic diastolic (congestive) heart failure: Secondary | ICD-10-CM | POA: Diagnosis not present

## 2017-10-18 DIAGNOSIS — D649 Anemia, unspecified: Secondary | ICD-10-CM | POA: Diagnosis not present

## 2017-10-23 NOTE — Progress Notes (Signed)
[facility]                                       Cardiology Office Note    Date:  10/25/2017   ID:  [name], DOB 1932/05/24, MRN [id number]  PCP:  Nicoletta Dress, MD  Cardiologist:  Dr. Stanford Breed / Dr. Cyndia Bent & Dr. Burt Knack  CC: 1 month follow up s/p TAVR  History of Present Illness:  [name] is a 84 y.o. male with a history of CAD s/p multivessel CABG (1994), chronic atrial fibrillation on pradaxa, COPD, DMT2, PAD, HTN and severe AS s/p TAVR (09/17/17) who presents to clinic for follow up.   He has known of the presence of a heart murmur for many years and he has been followed by Dr. Stanford Breed regularly. Echocardiograms have documented gradual progression and severity of aortic stenosis. Follow-up echocardiogram 07/29/2017 revealed severe aortic stenosis with peak velocity across the aortic valve measured 4.6 m/s corresponding to mean transvalvular gradient estimated 51 mmHg. The DVI was 0.18 corresponding to aortic valve area calculated 0.56 cm. Left ventricular systolic function remained normal with ejection fraction estimated 55-60%. There was also mild to moderate mitral stenosis with mean transvalvular gradient across the mitral valve calculated 8 mmHg and valve area calculated 1.69 cm by pressure half-time.  The patient was referred to Dr. Burt Knack and underwent diagnostic cardiac catheterization on August 14, 2017.  Catheterization revealed severe multivessel coronary artery disease with chronic total occlusion of the left anterior descending coronary artery, the right coronary artery, and the left circumflex coronary artery. There was continued patency of the left internal mammary artery graft to the distal left anterior descending coronary artery. There was also continued patency of the saphenous vein graft to the left circumflex system. The right coronary artery filled via left-to-right collaterals. There was moderate  stenosis of the apical LAD at the site of previous angioplasty. There was severe aortic stenosis and severe pulmonary hypertension. Peak to peak and mean transvalvular gradients across the aortic valve were measured 34 and 38 mmHg corresponding to aortic valve area calculated 0.89 cm. The patient was referred to the multidisciplinary valve team for potential TAVR. During his work up a possible gastric mass was found on CT and non emergent endoscopy was recommended. The patient elected to follow up with his own GI doctor about this.   He underwent succesful placement of a 26 mm Edwards Sapein 3 THV via the left subclavian approach on 09/17/17. Post operatieve echo showed normal functionging TAVR valve with no PVL and mean gradient of 31m Hg. He was discharged on ASA 853mdaily and Pradaxa.   At his post hospital follow up, he had penile swelling and was sent to urology.   Today he presents to clinic for follow up. His urologic issues have resolved. No CP or SOB. No LE edema, orthopnea or PND. No dizziness or syncope. No blood in stool or urine. No palpitations. He feels so much better in terms of his breathing and energy. He can tell a big difference. He can walk on flat ground with no issues, but still gets SOB with moderate exertion.    Past Medical History:  Diagnosis Date  . Acute myocardial infarction, unspecified site, initial episode of care   . Anemia   . Anxiety   . Aortic stenosis    .  Atrial fibrillation ([place])    a. permanent, on Coumadin for anticoagulation  . CAD (coronary artery disease)    a. s/p CABG in 1994 b. cath in 2015 showing normal LM, 100% LCx and RCA stenosis with 50-60% prox LAD stenosis and 100% mid-LAD stenosis; patent sequential SVG-OM2-OM3 and patent LIMA-LAD with PTCA of distal LAD via LIMA graft performed at that time  . Carotid stenosis   . CHF (congestive heart failure) ([place])   . COPD (chronic obstructive pulmonary disease) ([place])   . Depression   . Diabetes  mellitus without complication ([place])    FASTING 98-120S  . GERD (gastroesophageal reflux disease)   . History of blood transfusion   . History of peptic ulcer disease   . Hyperlipidemia   . Hypertension   . Myocardial infarction ([place])    X2  . Peripheral vascular disease ([place])   . Pneumonia    HX OF  . Renal artery stenosis ([place])   . Sleep apnea    OXYGEN AT NIGHT 2L [place]    Past Surgical History:  Procedure Laterality Date  . AORTIC ARCH ANGIOGRAPHY N/A 08/14/2017   Procedure: AORTIC ARCH ANGIOGRAPHY;  Surgeon: Sherren Mocha, MD;  Location: [facility];  Service: Cardiovascular;  Laterality: N/A;  . CARDIAC CATHETERIZATION    . CAROTID ENDARTERECTOMY    . COLON SURGERY    . CORONARY ARTERY BYPASS GRAFT  1994  . ENDARTERECTOMY Right 01/05/2013   Procedure: ENDARTERECTOMY CAROTID;  Surgeon: Rosetta Posner, MD;  Location: [facility];  Service: Vascular;  Laterality: Right;  . KNEE SURGERY  08/2003   left knee/ARTHROSCOPIC  . LEFT HEART CATHETERIZATION WITH CORONARY/GRAFT ANGIOGRAM N/A 09/01/2013   Procedure: LEFT HEART CATHETERIZATION WITH Beatrix Fetters;  Surgeon: Sinclair Grooms, MD;  Location: [facility] C[facility];  Service: Cardiovascular;  Laterality: N/A;  . PATCH ANGIOPLASTY Right 01/05/2013   Procedure: PATCH ANGIOPLASTY;  Surgeon: Rosetta Posner, MD;  Location: [facility];  Service: Vascular;  Laterality: Right;  . PERCUTANEOUS CORONARY STENT INTERVENTION (PCI-S)  09/01/2013   Procedure: PERCUTANEOUS CORONARY STENT INTERVENTION (PCI-S);  Surgeon: Sinclair Grooms, MD;  Location: [facility] CATH LAB;  Service: Cardiovascular;;  . removal of bleeding ulcer  1994  . RENAL ARTERY STENT     stenting of the left renal artery as well as a cutting balloon angioplasty for treatment of in-stent restenosis coronary artery bypass grafting in 1994.  [name] RIGHT/LEFT HEART CATH AND CORONARY/GRAFT ANGIOGRAPHY N/A 08/14/2017   Procedure: RIGHT/LEFT HEART CATH AND CORONARY/GRAFT ANGIOGRAPHY;  Surgeon: Sherren Mocha, MD;  Location: [facility];  Service: Cardiovascular;  Laterality: N/A;  . TEE WITHOUT CARDIOVERSION N/A 09/17/2017   Procedure: TRANSESOPHAGEAL ECHOCARDIOGRAM (TEE);  Surgeon: Sherren Mocha, MD;  Location: [facility];  Service: Open Heart Surgery;  Laterality: N/A;    Current Medications: Outpatient Medications Prior to Visit  Medication Sig Dispense Refill  . acetaminophen (TYLENOL) 500 MG tablet Take 1,000 mg by mouth every 6 (six) hours as needed for moderate pain or headache.     [name] acyclovir (ZOVIRAX) 800 MG tablet Take 400 mg by mouth 2 (two) times daily.     [name] albuterol (PROVENTIL,VENTOLIN) 90 MCG/ACT inhaler Inhale 2 puffs into the lungs every 4 (four) hours as needed for shortness of breath.     [name] aspirin EC 81 MG EC tablet Take 1 tablet (81 mg total) by mouth daily.    [name] atorvastatin (LIPITOR) 80 MG tablet Take 1 tablet (80 mg  total) by mouth at bedtime. 90 tablet 3  . benazepril (LOTENSIN) 20 MG tablet Take 10 mg by mouth daily.    . brimonidine (ALPHAGAN) 0.2 % ophthalmic solution Place 1 drop into both eyes 2 (two) times daily.    . Cholecalciferol (VITAMIN D) 2000 UNITS CAPS Take 2,000 Units by mouth daily.    [name] CLARITIN 10 MG tablet Take 10 mg by mouth daily.    . dabigatran (PRADAXA) 150 MG CAPS Take 150 mg by mouth 2 (two) times daily.     . ferrous sulfate 325 (65 FE) MG tablet Take 325 mg by mouth daily with breakfast.     . furosemide (LASIX) 20 MG tablet Take 2 tablets (40 mg total) by mouth daily. 30 tablet 5  . ipratropium-albuterol (DUONEB) 0.5-2.5 (3) MG/3ML SOLN Take 3 mLs by nebulization 3 (three) times daily.     . lansoprazole (PREVACID) 30 MG capsule Take 30 mg by mouth daily.      . metFORMIN (GLUCOPHAGE) 500 MG tablet Take 500 mg by mouth daily with breakfast.    . metoprolol (LOPRESSOR) 50 MG tablet Take 1 tablet (50 mg total) by mouth 2 (two) times daily. 180 tablet 4  . Multiple Vitamin (MULTIVITAMIN WITH MINERALS) TABS Take 1 tablet by mouth  daily.    . multivitamin-lutein (OCUVITE-LUTEIN) CAPS Take 1 capsule by mouth daily.    . predniSONE (DELTASONE) 20 MG tablet Take 20-60 mg by mouth daily as needed (for gout attack).     . terazosin (HYTRIN) 5 MG capsule Take 5 mg by mouth at bedtime.     . trolamine salicylate (ASPERCREME) 10 % cream Apply 1 application topically 4 (four) times daily as needed for muscle pain.     [name] oxyCODONE (OXY IR/ROXICODONE) 5 MG immediate release tablet Take 1 tablet (5 mg total) by mouth every 4 (four) hours as needed for severe pain. (Patient not taking: Reported on 10/24/2017) 30 tablet 0   No facility-administered medications prior to visit.      Allergies:   Bactrim [sulfamethoxazole-trimethoprim] and Levaquin [levofloxacin in d5w]   Social History   Socioeconomic History  . Marital status: Married    Spouse name: Not on file  . Number of children: Not on file  . Years of education: Not on file  . Highest education level: Not on file  Occupational History  . Not on file  Social Needs  . Financial resource strain: Not on file  . Food insecurity:    Worry: Not on file    Inability: Not on file  . Transportation needs:    Medical: Not on file    Non-medical: Not on file  Tobacco Use  . Smoking status: Former Smoker    Last attempt to quit: 04/27/1993    Years since quitting: 24.5  . Smokeless tobacco: Never Used  Substance and Sexual Activity  . Alcohol use: No    Alcohol/week: 0.0 oz  . Drug use: No  . Sexual activity: Yes  Lifestyle  . Physical activity:    Days per week: Not on file    Minutes per session: Not on file  . Stress: Not on file  Relationships  . Social connections:    Talks on phone: Not on file    Gets together: Not on file    Attends religious service: Not on file    Active member of club or organization: Not on file    Attends meetings of clubs or organizations: Not on file  Relationship status: Not on file  Other Topics Concern  . Not on file    Social History Narrative   Social History:   Married    Tobacco Use - No.    Alcohol Use - yes         Family History:   Mother died at age 32 of coronary disease.  Father died at 53 of coronary disease.  He has 13 siblings, almost all of whom have coronary disease and some with premature onset.     Family History:  The patient's family history includes Coronary artery disease in his father, mother, and unknown relative; Heart attack in his father and mother; Heart disease in his brother, father, mother, sister, and unknown relative; Hyperlipidemia in his mother; Hypertension in his mother.      ROS:   Please see the history of present illness.    ROS All other systems reviewed and are negative.   PHYSICAL EXAM:   VS:  BP (!) 124/58   Pulse 82   Ht 6' (1.829 m)   Wt 203 lb (92.1 kg) Comment: Pt stated his weight  SpO2 97%   BMI 27.53 kg/m    GEN: Well nourished, well developed, in no acute distress  HEENT: normal  Neck: no JVD, carotid bruits, or masses Cardiac: irreg irreg; no murmurs, rubs, or gallops,no edema  Respiratory:  clear to auscultation bilaterally, normal work of breathing GI: soft, nontender, nondistended, + BS MS: no deformity or atrophy  Skin: warm and dry, no rash Neuro:  Alert and Oriented x 3, Strength and sensation are intact Psych: euthymic mood, full affect   Wt Readings from Last 3 Encounters:  10/24/17 203 lb (92.1 kg)  09/26/17 224 lb (101.6 kg)  09/17/17 223 lb 4.8 oz (101.3 kg)      Studies/Labs Reviewed:   EKG:  EKG is NOT ordered today.    Recent Labs: 09/13/2017: ALT 23; B Natriuretic Peptide 245.9 09/18/2017: BUN 8; Creatinine, Ser 1.02; Hemoglobin 11.6; Magnesium 1.9; Platelets 168; Potassium 4.0; Sodium 137   Lipid Panel    Component Value Date/Time   CHOL 142 11/17/2012 0909   TRIG 90.0 11/17/2012 0909   HDL 46.80 11/17/2012 0909   CHOLHDL 3 11/17/2012 0909   VLDL 18.0 11/17/2012 0909   LDLCALC 77 11/17/2012 0909    LDLDIRECT 67.6 09/08/2009 0000    Additional studies/ records that were reviewed today include:  TAVR OPERATIVE NOTE   Date of Procedure:                09/17/2017  Preoperative Diagnosis:      Severe Aortic Stenosis   Postoperative Diagnosis:    Same   Procedure:       Transcatheter Aortic Valve Replacement - Left Subclavian Approach  Edwards Sapien 3 THV (size 26 mm, model # 9600TFX, serial # [id number])              Co-Surgeons:                        Gaye Pollack, MD and Sherren Mocha, MD  Anesthesiologist:                  Dr Tobias Alexander  Echocardiographer:              Dr Meda Coffee  Pre-operative Echo Findings: ? Severe aortic stenosis ? Normal left ventricular systolic function  Post-operative Echo Findings: ? No paravalvular leak ? Normal left ventricular systolic  function  ______________  Post operative echo 09/18/17 Study Conclusions - Left ventricle: The cavity size was normal. Systolic function was   normal. The estimated ejection fraction was in the range of 55%   to 60%. Wall motion was normal; there were no regional wall   motion abnormalities. - Aortic valve: A TAVR bioprosthesis was present. There was no   regurgitation. Peak velocity (S): 219 cm/s. Mean gradient (S): 8   mm Hg. Valve area (VTI): 1.73 cm^2. Valve area (Vmax): 1.36 cm^2.   Valve area (Vmean): 1.57 cm^2. - Mitral valve: Severely calcified annulus. The findings are   consistent with moderate stenosis. Mean gradient (D): 7 mm Hg.   Valve area by pressure half-time: 1.82 cm^2. Valve area by   continuity equation (using LVOT flow): 1.11 cm^2. - Tricuspid valve: There was mild regurgitation  ______________  2D ECHO 10/24/17 (30 day post op) Study Conclusions - Left ventricle: The cavity size was mildly dilated. Wall   thickness was normal. Systolic function was normal. The estimated   ejection fraction was in the range of 55% to 60%. Wall motion was   normal; there were no regional  wall motion abnormalities. - Aortic valve: A bioprosthesis was present. Mean gradient (S): 14   mm Hg. Peak gradient (S): 29 mm Hg. - Aortic root: The aortic root was mildly dilated. - Mitral valve: Calcified annulus. Mildly thickened leaflets . The   findings are consistent with mild to moderate stenosis. There was   mild regurgitation. - Left atrium: The atrium was severely dilated. - Right atrium: The atrium was mildly dilated. - Pulmonary arteries: Systolic pressure was moderately increased. Impressions: - Normal LV systolic function; mild LVE; s/p AVR with mean gradient   14 mmHg and no AI; mildly dilated aortic root; MAC with moderate   MS (mean gradient 12 mmHg and MVA 1.5 cm2 by pressure halft time)   and mild MR; severe LAE; mild RAE; trace TR with moderate   pulmonary hypertension.   ASSESSMENT & PLAN:   Severe AS s/p TAVR: ECHO today shows EF 55%, normally functioning TAVR valve with mean gradient 14 mm Hg and no PVL. He has NHYA class I symptoms. Continue ASA and Pradaxa. He can stop ASA after 6 months of therapy. SBE prophylaxis discussed. Amoxil has been called into his pharmacy. I will see him back in 1 year in valve clinic with an echo.   HTN: BP well controlled today  CAD: continue medical therapy   Permanent atrial fibrillation: rate controlled. Continue Pradaxa and Lopressor   Incidental findings: during his work up a possible gastric mass was found on CT and non emergent endoscopy was recommended. He would like to follow with Dr. Melina Copa in [place]. I got him an appointment for next week and faxed the CT scan.   Medication Adjustments/Labs and Tests Ordered: Current medicines are reviewed at length with the patient today.  Concerns regarding medicines are outlined above.  Medication changes, Labs and Tests ordered today are listed in the Patient Instructions below. Patient Instructions  Medication Instructions:  1) you may STOP ASPIRIN March 20, 2018. 2) Your  physician discussed the importance of taking an antibiotic prior to any dental, gastrointestinal, genitourinary procedures to prevent damage to the heart valves from infection. You have been called in AMOXIL 2 g to take one hour prior to dental visits.  Labwork: None   Testing/Procedures: Your provider has requested that you have an echocardiogram in [place]. Echocardiography is a painless  test that uses sound waves to create images of your heart. It provides your doctor with information about the size and shape of your heart and how well your heart's chambers and valves are working. This procedure takes approximately one hour. There are no restrictions for this procedure.  Follow-Up: Your provider recommends that you schedule a follow-up appointment in [place] with Nell Range, PA. You will be called to arrange this.   Any Other Special Instructions Will Be Listed Below (If Applicable).     If you need a refill on your cardiac medications before your next appointment, please call your pharmacy.      Signed, Angelena Form, PA-C  10/25/2017 8:21 AM    [facility] [place], [place], [place]  [address] Phone: [phone]; Fax: [phone]

## 2017-10-24 ENCOUNTER — Encounter (INDEPENDENT_AMBULATORY_CARE_PROVIDER_SITE_OTHER): Payer: Self-pay

## 2017-10-24 ENCOUNTER — Other Ambulatory Visit: Payer: Self-pay

## 2017-10-24 ENCOUNTER — Ambulatory Visit (HOSPITAL_COMMUNITY): Payer: Medicare Other | Attending: Cardiology

## 2017-10-24 ENCOUNTER — Ambulatory Visit (INDEPENDENT_AMBULATORY_CARE_PROVIDER_SITE_OTHER): Payer: Medicare Other | Admitting: Physician Assistant

## 2017-10-24 ENCOUNTER — Encounter: Payer: Self-pay | Admitting: Physician Assistant

## 2017-10-24 VITALS — BP 124/58 | HR 82 | Ht 72.0 in | Wt 203.0 lb

## 2017-10-24 DIAGNOSIS — I272 Pulmonary hypertension, unspecified: Secondary | ICD-10-CM | POA: Insufficient documentation

## 2017-10-24 DIAGNOSIS — Z48812 Encounter for surgical aftercare following surgery on the circulatory system: Secondary | ICD-10-CM | POA: Insufficient documentation

## 2017-10-24 DIAGNOSIS — Z87891 Personal history of nicotine dependence: Secondary | ICD-10-CM | POA: Insufficient documentation

## 2017-10-24 DIAGNOSIS — I050 Rheumatic mitral stenosis: Secondary | ICD-10-CM | POA: Diagnosis not present

## 2017-10-24 DIAGNOSIS — Z952 Presence of prosthetic heart valve: Secondary | ICD-10-CM

## 2017-10-24 DIAGNOSIS — I2583 Coronary atherosclerosis due to lipid rich plaque: Secondary | ICD-10-CM

## 2017-10-24 DIAGNOSIS — I482 Chronic atrial fibrillation: Secondary | ICD-10-CM | POA: Diagnosis not present

## 2017-10-24 DIAGNOSIS — R000 Tachycardia, unspecified: Secondary | ICD-10-CM | POA: Insufficient documentation

## 2017-10-24 DIAGNOSIS — I4891 Unspecified atrial fibrillation: Secondary | ICD-10-CM | POA: Insufficient documentation

## 2017-10-24 DIAGNOSIS — I10 Essential (primary) hypertension: Secondary | ICD-10-CM | POA: Insufficient documentation

## 2017-10-24 DIAGNOSIS — I739 Peripheral vascular disease, unspecified: Secondary | ICD-10-CM | POA: Diagnosis not present

## 2017-10-24 DIAGNOSIS — K319 Disease of stomach and duodenum, unspecified: Secondary | ICD-10-CM | POA: Diagnosis not present

## 2017-10-24 DIAGNOSIS — E785 Hyperlipidemia, unspecified: Secondary | ICD-10-CM | POA: Diagnosis not present

## 2017-10-24 DIAGNOSIS — I2510 Atherosclerotic heart disease of native coronary artery without angina pectoris: Secondary | ICD-10-CM | POA: Diagnosis not present

## 2017-10-24 DIAGNOSIS — I350 Nonrheumatic aortic (valve) stenosis: Secondary | ICD-10-CM | POA: Diagnosis not present

## 2017-10-24 DIAGNOSIS — I4821 Permanent atrial fibrillation: Secondary | ICD-10-CM

## 2017-10-24 DIAGNOSIS — I6529 Occlusion and stenosis of unspecified carotid artery: Secondary | ICD-10-CM | POA: Diagnosis not present

## 2017-10-24 DIAGNOSIS — K3189 Other diseases of stomach and duodenum: Secondary | ICD-10-CM

## 2017-10-24 MED ORDER — AMOXICILLIN 500 MG PO TABS: 2000.0000 mg | ORAL_TABLET | ORAL | 3 refills | Status: DC

## 2017-10-24 NOTE — Patient Instructions (Addendum)
Medication Instructions:  1) you may STOP ASPIRIN March 20, 2018. 2) Your physician discussed the importance of taking an antibiotic prior to any dental, gastrointestinal, genitourinary procedures to prevent damage to the heart valves from infection. You have been called in AMOXIL 2 g to take one hour prior to dental visits.  Labwork: None   Testing/Procedures: Your provider has requested that you have an echocardiogram in [place]. Echocardiography is a painless test that uses sound waves to create images of your heart. It provides your doctor with information about the size and shape of your heart and how well your heart's chambers and valves are working. This procedure takes approximately one hour. There are no restrictions for this procedure.  Follow-Up: Your provider recommends that you schedule a follow-up appointment in [place] with Nell Range, PA. You will be called to arrange this.   Any Other Special Instructions Will Be Listed Below (If Applicable).     If you need a refill on your cardiac medications before your next appointment, please call your pharmacy.

## 2017-10-25 DIAGNOSIS — I2510 Atherosclerotic heart disease of native coronary artery without angina pectoris: Secondary | ICD-10-CM | POA: Diagnosis not present

## 2017-10-25 DIAGNOSIS — I5032 Chronic diastolic (congestive) heart failure: Secondary | ICD-10-CM | POA: Diagnosis not present

## 2017-10-25 DIAGNOSIS — I110 Hypertensive heart disease with heart failure: Secondary | ICD-10-CM | POA: Diagnosis not present

## 2017-10-25 DIAGNOSIS — E1151 Type 2 diabetes mellitus with diabetic peripheral angiopathy without gangrene: Secondary | ICD-10-CM | POA: Diagnosis not present

## 2017-10-25 DIAGNOSIS — D649 Anemia, unspecified: Secondary | ICD-10-CM | POA: Diagnosis not present

## 2017-10-25 DIAGNOSIS — Z48812 Encounter for surgical aftercare following surgery on the circulatory system: Secondary | ICD-10-CM | POA: Diagnosis not present

## 2017-10-30 DIAGNOSIS — Z125 Encounter for screening for malignant neoplasm of prostate: Secondary | ICD-10-CM | POA: Diagnosis not present

## 2017-10-30 DIAGNOSIS — Z9181 History of falling: Secondary | ICD-10-CM | POA: Diagnosis not present

## 2017-10-30 DIAGNOSIS — Z0000 Encounter for general adult medical examination without abnormal findings: Secondary | ICD-10-CM | POA: Diagnosis not present

## 2017-10-30 DIAGNOSIS — E785 Hyperlipidemia, unspecified: Secondary | ICD-10-CM | POA: Diagnosis not present

## 2017-10-30 DIAGNOSIS — Z1331 Encounter for screening for depression: Secondary | ICD-10-CM | POA: Diagnosis not present

## 2017-10-31 DIAGNOSIS — K319 Disease of stomach and duodenum, unspecified: Secondary | ICD-10-CM | POA: Diagnosis not present

## 2017-11-07 ENCOUNTER — Encounter: Payer: Self-pay | Admitting: Thoracic Surgery (Cardiothoracic Vascular Surgery)

## 2017-11-11 ENCOUNTER — Ambulatory Visit (HOSPITAL_COMMUNITY)
Admit: 2017-11-11 | Discharge: 2017-11-11 | Disposition: A | Discharge status: Home / Self Care | Payer: Medicare Other | Source: Ambulatory Visit | Attending: Cardiovascular Disease | Admitting: Cardiovascular Disease

## 2017-11-11 DIAGNOSIS — I701 Atherosclerosis of renal artery: Secondary | ICD-10-CM | POA: Diagnosis not present

## 2017-11-11 DIAGNOSIS — I774 Celiac artery compression syndrome: Secondary | ICD-10-CM | POA: Insufficient documentation

## 2017-11-12 ENCOUNTER — Other Ambulatory Visit: Payer: Self-pay | Admitting: *Deleted

## 2017-11-12 DIAGNOSIS — I701 Atherosclerosis of renal artery: Secondary | ICD-10-CM

## 2017-11-18 ENCOUNTER — Encounter: Payer: Self-pay | Admitting: Cardiology

## 2017-11-18 ENCOUNTER — Ambulatory Visit (INDEPENDENT_AMBULATORY_CARE_PROVIDER_SITE_OTHER): Payer: Medicare Other | Admitting: Cardiology

## 2017-11-18 VITALS — BP 112/50 | HR 60 | Ht 72.0 in | Wt 212.0 lb

## 2017-11-18 DIAGNOSIS — I4821 Permanent atrial fibrillation: Secondary | ICD-10-CM

## 2017-11-18 DIAGNOSIS — E7800 Pure hypercholesterolemia, unspecified: Secondary | ICD-10-CM

## 2017-11-18 DIAGNOSIS — I482 Chronic atrial fibrillation: Secondary | ICD-10-CM | POA: Diagnosis not present

## 2017-11-18 DIAGNOSIS — I2510 Atherosclerotic heart disease of native coronary artery without angina pectoris: Secondary | ICD-10-CM | POA: Diagnosis not present

## 2017-11-18 DIAGNOSIS — I10 Essential (primary) hypertension: Secondary | ICD-10-CM | POA: Diagnosis not present

## 2017-11-18 DIAGNOSIS — I701 Atherosclerosis of renal artery: Secondary | ICD-10-CM

## 2017-11-18 DIAGNOSIS — Z952 Presence of prosthetic heart valve: Secondary | ICD-10-CM

## 2017-11-18 NOTE — Patient Instructions (Signed)
Your physician wants you to follow-up in: 6 MONTHS WITH DR CRENSHAW You will receive a reminder letter in the mail two months in advance. If you don't receive a letter, please call our office to schedule the follow-up appointment.   If you need a refill on your cardiac medications before your next appointment, please call your pharmacy.  

## 2017-11-18 NOTE — Progress Notes (Signed)
HPI: FU coronary artery disease, status post coronary artery bypassing graft, permanent atrial fibrillation, AS s/p TAVR and peripheral vascular disease (renal artery stenosis and cerebrovascular disease). Pt had right carotid endarterectomy in August of 2014.  Carotid Dopplers October 2018 showed less than 50% stenosis on the right and 40-59% stenosis on the left.  Follow-up 1 year.    Echocardiogram repeated February 2019 and showed normal LV function, severe aortic stenosis and mild mitral regurgitation.  There was moderate mitral stenosis, biatrial enlargement, mild to moderate tricuspid regurgitation.  Patient had cardiac catheterization March 2019 that showed occluded native vessels.  The saphenous vein graft to the first and second marginals was patent as was the LIMA to the LAD.  There was moderate stenosis of the apical LAD at previous angioplasty site.  Patient subsequently underwent TAVR.  Follow-up echocardiogram May 2019 showed normal LV function, bioprosthetic aortic valve with mean gradient 14 mmHg, mild to moderate mitral stenosis and mild mitral regurgitation.  There was biatrial enlargement.  Abdominal ultrasound June 2019 showed 1 to 59% bilateral renal artery stenosis, 70 to 99% celiac artery and 2.7 x 2.7 ectatic abdominal aorta.  Note preprocedure CT March 2019 showed possible enhancing gastric mass and EGD recommended.  Since last seenpatient states his dyspnea has improved.  He denies chest pain, palpitations, syncope or bleeding.  Current Outpatient Medications  Medication Sig Dispense Refill  . acetaminophen (TYLENOL) 500 MG tablet Take 1,000 mg by mouth every 6 (six) hours as needed for moderate pain or headache.     [name] acyclovir (ZOVIRAX) 800 MG tablet Take 400 mg by mouth 2 (two) times daily.     [name] albuterol (PROVENTIL,VENTOLIN) 90 MCG/ACT inhaler Inhale 2 puffs into the lungs every 4 (four) hours as needed for shortness of breath.     [name] amoxicillin (AMOXIL) 500 MG  tablet Take 4 tablets (2,000 mg total) by mouth as directed. Take one hour prior to dental visits. 12 tablet 3  . aspirin EC 81 MG EC tablet Take 1 tablet (81 mg total) by mouth daily.    [name] atorvastatin (LIPITOR) 80 MG tablet Take 1 tablet (80 mg total) by mouth at bedtime. 90 tablet 3  . benazepril (LOTENSIN) 20 MG tablet Take 10 mg by mouth daily.    . brimonidine (ALPHAGAN) 0.2 % ophthalmic solution Place 1 drop into both eyes 2 (two) times daily.    . Cholecalciferol (VITAMIN D) 2000 UNITS CAPS Take 2,000 Units by mouth daily.    [name] CLARITIN 10 MG tablet Take 10 mg by mouth daily.    . dabigatran (PRADAXA) 150 MG CAPS Take 150 mg by mouth 2 (two) times daily.     . ferrous sulfate 325 (65 FE) MG tablet Take 325 mg by mouth daily with breakfast.     . furosemide (LASIX) 20 MG tablet Take 2 tablets (40 mg total) by mouth daily. 30 tablet 5  . ipratropium-albuterol (DUONEB) 0.5-2.5 (3) MG/3ML SOLN Take 3 mLs by nebulization 3 (three) times daily.     . lansoprazole (PREVACID) 30 MG capsule Take 30 mg by mouth daily.      . metFORMIN (GLUCOPHAGE) 500 MG tablet Take 500 mg by mouth daily with breakfast.    . metoprolol (LOPRESSOR) 50 MG tablet Take 1 tablet (50 mg total) by mouth 2 (two) times daily. 180 tablet 4  . Multiple Vitamin (MULTIVITAMIN WITH MINERALS) TABS Take 1 tablet by mouth daily.    . multivitamin-lutein (OCUVITE-LUTEIN) CAPS  Take 1 capsule by mouth daily.    . predniSONE (DELTASONE) 20 MG tablet Take 20-60 mg by mouth daily as needed (for gout attack).     . terazosin (HYTRIN) 5 MG capsule Take 5 mg by mouth at bedtime.     . trolamine salicylate (ASPERCREME) 10 % cream Apply 1 application topically 4 (four) times daily as needed for muscle pain.      No current facility-administered medications for this visit.      Past Medical History:  Diagnosis Date  . Acute myocardial infarction, unspecified site, initial episode of care   . Anemia   . Anxiety   . Aortic stenosis      . Atrial fibrillation ([place])    a. permanent, on Coumadin for anticoagulation  . CAD (coronary artery disease)    a. s/p CABG in 1994 b. cath in 2015 showing normal LM, 100% LCx and RCA stenosis with 50-60% prox LAD stenosis and 100% mid-LAD stenosis; patent sequential SVG-OM2-OM3 and patent LIMA-LAD with PTCA of distal LAD via LIMA graft performed at that time  . Carotid stenosis   . CHF (congestive heart failure) ([place])   . COPD (chronic obstructive pulmonary disease) ([place])   . Depression   . Diabetes mellitus without complication ([place])    FASTING 98-120S  . GERD (gastroesophageal reflux disease)   . History of blood transfusion   . History of peptic ulcer disease   . Hyperlipidemia   . Hypertension   . Myocardial infarction ([place])    X2  . Peripheral vascular disease ([place])   . Pneumonia    HX OF  . Renal artery stenosis ([place])   . Sleep apnea    OXYGEN AT NIGHT 2L [place]    Past Surgical History:  Procedure Laterality Date  . AORTIC ARCH ANGIOGRAPHY N/A 08/14/2017   Procedure: AORTIC ARCH ANGIOGRAPHY;  Surgeon: Sherren Mocha, MD;  Location: [place] [facility];  Service: Cardiovascular;  Laterality: N/A;  . CARDIAC CATHETERIZATION    . CAROTID ENDARTERECTOMY    . COLON SURGERY    . CORONARY ARTERY BYPASS GRAFT  1994  . ENDARTERECTOMY Right 01/05/2013   Procedure: ENDARTERECTOMY CAROTID;  Surgeon: Rosetta Posner, MD;  Location: [place];  Service: Vascular;  Laterality: Right;  . KNEE SURGERY  08/2003   left knee/ARTHROSCOPIC  . LEFT HEART CATHETERIZATION WITH CORONARY/GRAFT ANGIOGRAM N/A 09/01/2013   Procedure: LEFT HEART CATHETERIZATION WITH Beatrix Fetters;  Surgeon: Sinclair Grooms, MD;  Location: [facility] CATH LAB;  Service: Cardiovascular;  Laterality: N/A;  . PATCH ANGIOPLASTY Right 01/05/2013   Procedure: PATCH ANGIOPLASTY;  Surgeon: Rosetta Posner, MD;  Location: [place];  Service: Vascular;  Laterality: Right;  . PERCUTANEOUS CORONARY STENT INTERVENTION (PCI-S)  09/01/2013    Procedure: PERCUTANEOUS CORONARY STENT INTERVENTION (PCI-S);  Surgeon: Sinclair Grooms, MD;  Location: [facility] CATH LAB;  Service: Cardiovascular;;  . removal of bleeding ulcer  1994  . RENAL ARTERY STENT     stenting of the left renal artery as well as a cutting balloon angioplasty for treatment of in-stent restenosis coronary artery bypass grafting in 1994.  [name] RIGHT/LEFT HEART CATH AND CORONARY/GRAFT ANGIOGRAPHY N/A 08/14/2017   Procedure: RIGHT/LEFT HEART CATH AND CORONARY/GRAFT ANGIOGRAPHY;  Surgeon: Sherren Mocha, MD;  Location: [place] [facility];  Service: Cardiovascular;  Laterality: N/A;  . TEE WITHOUT CARDIOVERSION N/A 09/17/2017   Procedure: TRANSESOPHAGEAL ECHOCARDIOGRAM (TEE);  Surgeon: Sherren Mocha, MD;  Location: [place];  Service: Open Heart Surgery;  Laterality: N/A;    Social History   Socioeconomic History  . Marital status: Married    Spouse name: Not on file  . Number of children: Not on file  . Years of education: Not on file  . Highest education level: Not on file  Occupational History  . Not on file  Social Needs  . Financial resource strain: Not on file  . Food insecurity:    Worry: Not on file    Inability: Not on file  . Transportation needs:    Medical: Not on file    Non-medical: Not on file  Tobacco Use  . Smoking status: Former Smoker    Last attempt to quit: 04/27/1993    Years since quitting: 24.5  . Smokeless tobacco: Never Used  Substance and Sexual Activity  . Alcohol use: No    Alcohol/week: 0.0 oz  . Drug use: No  . Sexual activity: Yes  Lifestyle  . Physical activity:    Days per week: Not on file    Minutes per session: Not on file  . Stress: Not on file  Relationships  . Social connections:    Talks on phone: Not on file    Gets together: Not on file    Attends religious service: Not on file    Active member of club or organization: Not on file    Attends meetings of clubs or organizations: Not on file    Relationship status:  Not on file  . Intimate partner violence:    Fear of current or ex partner: Not on file    Emotionally abused: Not on file    Physically abused: Not on file    Forced sexual activity: Not on file  Other Topics Concern  . Not on file  Social History Narrative   Social History:   Married    Tobacco Use - No.    Alcohol Use - yes         Family History:   Mother died at age 50 of coronary disease.  Father died at 43 of coronary disease.  He has 13 siblings, almost all of whom have coronary disease and some with premature onset.    Family History  Problem Relation Age of Onset  . Coronary artery disease Mother   . Heart disease Mother        Before age 79  . Hyperlipidemia Mother   . Hypertension Mother   . Heart attack Mother   . Coronary artery disease Father   . Heart disease Father        After age 74  . Heart attack Father   . Heart disease Sister        After age 55  . Heart disease Brother        After age 66  . Coronary artery disease Unknown        13 sibling, almost all have coronary disease and some with premature onset  . Heart disease Unknown        13 sibling, almost all have coronary disease and some with premature onset    ROS: no fevers or chills, productive cough, hemoptysis, dysphasia, odynophagia, melena, hematochezia, dysuria, hematuria, rash, seizure activity, orthopnea, PND, pedal edema, claudication. Remaining systems are negative.  Physical Exam: Well-developed well-nourished in no acute distress.  Skin is warm and dry.  Multiple ecchymoses noted. HEENT is normal.  Neck is supple.  Chest is clear to auscultation with normal expansion.  Cardiovascular exam is irregular.  2/6 systolic murmur.  No diastolic murmur. Abdominal exam nontender or distended. No masses palpated. Extremities show no edema. neuro grossly intact  A/P  1 status post aortic valve replacement-patient doing well.  Plan to continue SBE prophylaxis.  Card provided today.   Continue aspirin for 6 months following procedure and then discontinue.  2 permanent atrial fibrillation-continue Pradaxa and beta-blocker.  3 carotid artery disease-plan follow-up carotid Dopplers October 2019.  4 coronary artery disease-continue statin.  5 hypertension-blood pressure is controlled.  Continue present medications.  6 hyperlipidemia-continue statin.  7 renal artery stenosis-continue statin.  Follow-up renal Dopplers June 2020.  8 gastric mass-follow-up gastroenterology.  He is scheduled for EGD later this week.  Kirk Ruths, MD

## 2017-11-20 DIAGNOSIS — K219 Gastro-esophageal reflux disease without esophagitis: Secondary | ICD-10-CM | POA: Diagnosis not present

## 2017-11-20 DIAGNOSIS — B3781 Candidal esophagitis: Secondary | ICD-10-CM | POA: Diagnosis not present

## 2017-11-20 DIAGNOSIS — K319 Disease of stomach and duodenum, unspecified: Secondary | ICD-10-CM | POA: Diagnosis not present

## 2017-11-20 DIAGNOSIS — J449 Chronic obstructive pulmonary disease, unspecified: Secondary | ICD-10-CM | POA: Diagnosis not present

## 2017-11-25 DIAGNOSIS — H401132 Primary open-angle glaucoma, bilateral, moderate stage: Secondary | ICD-10-CM | POA: Diagnosis not present

## 2017-11-25 DIAGNOSIS — H348111 Central retinal vein occlusion, right eye, with retinal neovascularization: Secondary | ICD-10-CM | POA: Diagnosis not present

## 2017-11-25 DIAGNOSIS — H524 Presbyopia: Secondary | ICD-10-CM | POA: Diagnosis not present

## 2017-12-26 DIAGNOSIS — J440 Chronic obstructive pulmonary disease with acute lower respiratory infection: Secondary | ICD-10-CM | POA: Diagnosis not present

## 2017-12-26 DIAGNOSIS — Z6830 Body mass index (BMI) 30.0-30.9, adult: Secondary | ICD-10-CM | POA: Diagnosis not present

## 2018-01-13 ENCOUNTER — Encounter: Payer: Self-pay | Admitting: Cardiology

## 2018-01-13 DIAGNOSIS — E785 Hyperlipidemia, unspecified: Secondary | ICD-10-CM | POA: Diagnosis not present

## 2018-01-13 DIAGNOSIS — I2510 Atherosclerotic heart disease of native coronary artery without angina pectoris: Secondary | ICD-10-CM | POA: Diagnosis not present

## 2018-01-13 DIAGNOSIS — D509 Iron deficiency anemia, unspecified: Secondary | ICD-10-CM | POA: Diagnosis not present

## 2018-01-13 DIAGNOSIS — M109 Gout, unspecified: Secondary | ICD-10-CM | POA: Diagnosis not present

## 2018-01-13 DIAGNOSIS — E1142 Type 2 diabetes mellitus with diabetic polyneuropathy: Secondary | ICD-10-CM | POA: Diagnosis not present

## 2018-01-13 DIAGNOSIS — Z1339 Encounter for screening examination for other mental health and behavioral disorders: Secondary | ICD-10-CM | POA: Diagnosis not present

## 2018-03-31 ENCOUNTER — Ambulatory Visit (HOSPITAL_COMMUNITY)
Admission: RE | Admit: 2018-03-31 | Discharge: 2018-03-31 | Disposition: A | Discharge status: Home / Self Care | Payer: Medicare Other | Source: Ambulatory Visit | Attending: Cardiovascular Disease | Admitting: Cardiovascular Disease

## 2018-03-31 DIAGNOSIS — I679 Cerebrovascular disease, unspecified: Secondary | ICD-10-CM | POA: Diagnosis not present

## 2018-03-31 DIAGNOSIS — I739 Peripheral vascular disease, unspecified: Secondary | ICD-10-CM | POA: Insufficient documentation

## 2018-03-31 DIAGNOSIS — I6523 Occlusion and stenosis of bilateral carotid arteries: Secondary | ICD-10-CM | POA: Insufficient documentation

## 2018-04-01 ENCOUNTER — Other Ambulatory Visit: Payer: Self-pay | Admitting: *Deleted

## 2018-04-01 DIAGNOSIS — I6523 Occlusion and stenosis of bilateral carotid arteries: Secondary | ICD-10-CM

## 2018-04-01 NOTE — Progress Notes (Signed)
vas 

## 2018-04-11 DIAGNOSIS — J208 Acute bronchitis due to other specified organisms: Secondary | ICD-10-CM | POA: Diagnosis not present

## 2018-04-24 DIAGNOSIS — M109 Gout, unspecified: Secondary | ICD-10-CM | POA: Diagnosis not present

## 2018-04-24 DIAGNOSIS — E1142 Type 2 diabetes mellitus with diabetic polyneuropathy: Secondary | ICD-10-CM | POA: Diagnosis not present

## 2018-04-24 DIAGNOSIS — D509 Iron deficiency anemia, unspecified: Secondary | ICD-10-CM | POA: Diagnosis not present

## 2018-04-24 DIAGNOSIS — E785 Hyperlipidemia, unspecified: Secondary | ICD-10-CM | POA: Diagnosis not present

## 2018-04-24 DIAGNOSIS — I2510 Atherosclerotic heart disease of native coronary artery without angina pectoris: Secondary | ICD-10-CM | POA: Diagnosis not present

## 2018-05-05 NOTE — Progress Notes (Signed)
HPI: FU coronary artery disease, status post coronary artery bypassing graft, permanent atrial fibrillation, AS s/p TAVR and peripheral vascular disease (renal artery stenosis and cerebrovascular disease). Pt had right carotid endarterectomy in August of 2014.  Echocardiogram February 2019 showed severe aortic stenosis.  Patient had cardiac catheterization March 2019 that showed occluded native vessels.  The saphenous vein graft to the first and second marginals was patent as was the LIMA to the LAD.  There was moderate stenosis of the apical LAD at previous angioplasty site.  Patient subsequently underwent TAVR.  Follow-up echocardiogram May 2019 showed normal LV function, bioprosthetic aortic valve with mean gradient 14 mmHg, mild to moderate mitral stenosis and mild mitral regurgitation.  There was biatrial enlargement.  Abdominal ultrasound June 2019 showed 1 to 59% bilateral renal artery stenosis, 70 to 99% celiac artery and 2.7 x 2.7 ectatic abdominal aorta.  Note preprocedure CT March 2019 showed possible enhancing gastric mass and EGD recommended. Carotid Dopplers October 2019 showed 1 to 39% right and 40 to 59% left stenosis.  Since last seenthe patient denies any dyspnea on exertion, orthopnea, PND, pedal edema, palpitations, syncope or chest pain.   Current Outpatient Medications  Medication Sig Dispense Refill  . acetaminophen (TYLENOL) 500 MG tablet Take 1,000 mg by mouth every 6 (six) hours as needed for moderate pain or headache.     [name] acyclovir (ZOVIRAX) 800 MG tablet Take 400 mg by mouth 2 (two) times daily.     [name] albuterol (PROVENTIL,VENTOLIN) 90 MCG/ACT inhaler Inhale 2 puffs into the lungs every 4 (four) hours as needed for shortness of breath.     [name] amoxicillin (AMOXIL) 500 MG tablet Take 4 tablets (2,000 mg total) by mouth as directed. Take one hour prior to dental visits. 12 tablet 3  . atorvastatin (LIPITOR) 80 MG tablet Take 1 tablet (80 mg total) by mouth at bedtime. 90  tablet 3  . benazepril (LOTENSIN) 20 MG tablet Take 10 mg by mouth daily.    . brimonidine (ALPHAGAN) 0.2 % ophthalmic solution Place 1 drop into both eyes 2 (two) times daily.    . Cholecalciferol (VITAMIN D) 2000 UNITS CAPS Take 2,000 Units by mouth daily.    [name] CLARITIN 10 MG tablet Take 10 mg by mouth daily.    . dabigatran (PRADAXA) 150 MG CAPS Take 150 mg by mouth 2 (two) times daily.     . ferrous sulfate 325 (65 FE) MG tablet Take 325 mg by mouth daily with breakfast.     . furosemide (LASIX) 20 MG tablet Take 2 tablets (40 mg total) by mouth daily. 30 tablet 5  . ipratropium-albuterol (DUONEB) 0.5-2.5 (3) MG/3ML SOLN Take 3 mLs by nebulization 3 (three) times daily.     . lansoprazole (PREVACID) 30 MG capsule Take 30 mg by mouth daily.      . metFORMIN (GLUCOPHAGE) 500 MG tablet Take 500 mg by mouth daily with breakfast.    . metoprolol (LOPRESSOR) 50 MG tablet Take 1 tablet (50 mg total) by mouth 2 (two) times daily. 180 tablet 4  . Multiple Vitamin (MULTIVITAMIN WITH MINERALS) TABS Take 1 tablet by mouth daily.    . multivitamin-lutein (OCUVITE-LUTEIN) CAPS Take 1 capsule by mouth daily.    . predniSONE (DELTASONE) 20 MG tablet Take 20-60 mg by mouth daily as needed (for gout attack).     . terazosin (HYTRIN) 5 MG capsule Take 5 mg by mouth at bedtime.     . trolamine  salicylate (ASPERCREME) 10 % cream Apply 1 application topically 4 (four) times daily as needed for muscle pain.      No current facility-administered medications for this visit.      Past Medical History:  Diagnosis Date  . Acute myocardial infarction, unspecified site, initial episode of care   . Anemia   . Anxiety   . Aortic stenosis    . Atrial fibrillation ([place])    a. permanent, on Coumadin for anticoagulation  . CAD (coronary artery disease)    a. s/p CABG in 1994 b. cath in 2015 showing normal LM, 100% LCx and RCA stenosis with 50-60% prox LAD stenosis and 100% mid-LAD stenosis; patent sequential  SVG-OM2-OM3 and patent LIMA-LAD with PTCA of distal LAD via LIMA graft performed at that time  . Carotid stenosis   . CHF (congestive heart failure) ([place])   . COPD (chronic obstructive pulmonary disease) ([place])   . Depression   . Diabetes mellitus without complication ([place])    FASTING 98-120S  . GERD (gastroesophageal reflux disease)   . History of blood transfusion   . History of peptic ulcer disease   . Hyperlipidemia   . Hypertension   . Myocardial infarction ([place])    X2  . Peripheral vascular disease ([place])   . Pneumonia    HX OF  . Renal artery stenosis ([place])   . Sleep apnea    OXYGEN AT NIGHT 2L [place]    Past Surgical History:  Procedure Laterality Date  . AORTIC ARCH ANGIOGRAPHY N/A 08/14/2017   Procedure: AORTIC ARCH ANGIOGRAPHY;  Surgeon: Sherren Mocha, MD;  Location: [place] [facility];  Service: Cardiovascular;  Laterality: N/A;  . CARDIAC CATHETERIZATION    . CAROTID ENDARTERECTOMY    . COLON SURGERY    . CORONARY ARTERY BYPASS GRAFT  1994  . ENDARTERECTOMY Right 01/05/2013   Procedure: ENDARTERECTOMY CAROTID;  Surgeon: Rosetta Posner, MD;  Location: [place];  Service: Vascular;  Laterality: Right;  . KNEE SURGERY  08/2003   left knee/ARTHROSCOPIC  . LEFT HEART CATHETERIZATION WITH CORONARY/GRAFT ANGIOGRAM N/A 09/01/2013   Procedure: LEFT HEART CATHETERIZATION WITH Beatrix Fetters;  Surgeon: Sinclair Grooms, MD;  Location: [facility] CATH LAB;  Service: Cardiovascular;  Laterality: N/A;  . PATCH ANGIOPLASTY Right 01/05/2013   Procedure: PATCH ANGIOPLASTY;  Surgeon: Rosetta Posner, MD;  Location: [place];  Service: Vascular;  Laterality: Right;  . PERCUTANEOUS CORONARY STENT INTERVENTION (PCI-S)  09/01/2013   Procedure: PERCUTANEOUS CORONARY STENT INTERVENTION (PCI-S);  Surgeon: Sinclair Grooms, MD;  Location: [facility] CATH LAB;  Service: Cardiovascular;;  . removal of bleeding ulcer  1994  . RENAL ARTERY STENT     stenting of the left renal artery as well as a cutting balloon  angioplasty for treatment of in-stent restenosis coronary artery bypass grafting in 1994.  [name] RIGHT/LEFT HEART CATH AND CORONARY/GRAFT ANGIOGRAPHY N/A 08/14/2017   Procedure: RIGHT/LEFT HEART CATH AND CORONARY/GRAFT ANGIOGRAPHY;  Surgeon: Sherren Mocha, MD;  Location: [place] [facility];  Service: Cardiovascular;  Laterality: N/A;  . TEE WITHOUT CARDIOVERSION N/A 09/17/2017   Procedure: TRANSESOPHAGEAL ECHOCARDIOGRAM (TEE);  Surgeon: Sherren Mocha, MD;  Location: [place];  Service: Open Heart Surgery;  Laterality: N/A;    Social History   Socioeconomic History  . Marital status: Married    Spouse name: Not on file  . Number of children: Not on file  . Years of education: Not on file  . Highest education level: Not on file  Occupational  History  . Not on file  Social Needs  . Financial resource strain: Not on file  . Food insecurity:    Worry: Not on file    Inability: Not on file  . Transportation needs:    Medical: Not on file    Non-medical: Not on file  Tobacco Use  . Smoking status: Former Smoker    Last attempt to quit: 04/27/1993    Years since quitting: 25.0  . Smokeless tobacco: Never Used  Substance and Sexual Activity  . Alcohol use: No    Alcohol/week: 0.0 standard drinks  . Drug use: No  . Sexual activity: Yes  Lifestyle  . Physical activity:    Days per week: Not on file    Minutes per session: Not on file  . Stress: Not on file  Relationships  . Social connections:    Talks on phone: Not on file    Gets together: Not on file    Attends religious service: Not on file    Active member of club or organization: Not on file    Attends meetings of clubs or organizations: Not on file    Relationship status: Not on file  . Intimate partner violence:    Fear of current or ex partner: Not on file    Emotionally abused: Not on file    Physically abused: Not on file    Forced sexual activity: Not on file  Other Topics Concern  . Not on file  Social History  Narrative   Social History:   Married    Tobacco Use - No.    Alcohol Use - yes         Family History:   Mother died at age 86 of coronary disease.  Father died at 37 of coronary disease.  He has 13 siblings, almost all of whom have coronary disease and some with premature onset.    Family History  Problem Relation Age of Onset  . Coronary artery disease Mother   . Heart disease Mother        Before age 27  . Hyperlipidemia Mother   . Hypertension Mother   . Heart attack Mother   . Coronary artery disease Father   . Heart disease Father        After age 71  . Heart attack Father   . Heart disease Sister        After age 4  . Heart disease Brother        After age 29  . Coronary artery disease Other        13 sibling, almost all have coronary disease and some with premature onset  . Heart disease Other        13 sibling, almost all have coronary disease and some with premature onset    ROS: no fevers or chills, productive cough, hemoptysis, dysphasia, odynophagia, melena, hematochezia, dysuria, hematuria, rash, seizure activity, orthopnea, PND, pedal edema, claudication. Remaining systems are negative.  Physical Exam: Well-developed well-nourished in no acute distress.  Skin diffuse ecchymosis HEENT is normal.  Neck is supple.  Chest is clear to auscultation with normal expansion.  Cardiovascular exam is irregular.  2/6 systolic murmur left sternal border.  No diastolic murmur Abdominal exam nontender or distended. No masses palpated. Extremities show no edema. neuro grossly intact   A/P  1 status post TAVR-patient doing well.  Continue SBE prophylaxis.  2 permanent atrial fibrillation-continue beta-blocker for rate control.  Continue Pradaxa.  3 coronary  artery disease-continue statin.  Aspirin discontinued because of need for anticoagulation.  4 carotid artery disease-follow-up carotid Dopplers October 2020.  5 hypertension-patient's blood pressure is  controlled today.  Continue present medications and follow.  6 hyperlipidemia-continue statin.  7 renal artery stenosis-plan follow-up renal Dopplers June 2020.  Kirk Ruths, MD

## 2018-05-19 ENCOUNTER — Encounter: Payer: Self-pay | Admitting: Cardiology

## 2018-05-19 ENCOUNTER — Ambulatory Visit (INDEPENDENT_AMBULATORY_CARE_PROVIDER_SITE_OTHER): Payer: Medicare Other | Admitting: Cardiology

## 2018-05-19 ENCOUNTER — Encounter (INDEPENDENT_AMBULATORY_CARE_PROVIDER_SITE_OTHER): Payer: Self-pay

## 2018-05-19 VITALS — BP 134/54 | HR 66 | Ht 72.0 in | Wt 205.0 lb

## 2018-05-19 DIAGNOSIS — I701 Atherosclerosis of renal artery: Secondary | ICD-10-CM | POA: Diagnosis not present

## 2018-05-19 DIAGNOSIS — E7800 Pure hypercholesterolemia, unspecified: Secondary | ICD-10-CM | POA: Diagnosis not present

## 2018-05-19 DIAGNOSIS — I10 Essential (primary) hypertension: Secondary | ICD-10-CM

## 2018-05-19 DIAGNOSIS — I2510 Atherosclerotic heart disease of native coronary artery without angina pectoris: Secondary | ICD-10-CM | POA: Diagnosis not present

## 2018-05-19 DIAGNOSIS — Z952 Presence of prosthetic heart valve: Secondary | ICD-10-CM | POA: Diagnosis not present

## 2018-05-19 NOTE — Patient Instructions (Signed)

## 2018-05-21 DIAGNOSIS — H4322 Crystalline deposits in vitreous body, left eye: Secondary | ICD-10-CM | POA: Diagnosis not present

## 2018-05-21 DIAGNOSIS — H43813 Vitreous degeneration, bilateral: Secondary | ICD-10-CM | POA: Diagnosis not present

## 2018-05-21 DIAGNOSIS — H34811 Central retinal vein occlusion, right eye, with macular edema: Secondary | ICD-10-CM | POA: Diagnosis not present

## 2018-05-21 DIAGNOSIS — H4051X1 Glaucoma secondary to other eye disorders, right eye, mild stage: Secondary | ICD-10-CM | POA: Diagnosis not present

## 2018-06-16 DIAGNOSIS — J440 Chronic obstructive pulmonary disease with acute lower respiratory infection: Secondary | ICD-10-CM | POA: Diagnosis not present

## 2018-07-30 DIAGNOSIS — I2510 Atherosclerotic heart disease of native coronary artery without angina pectoris: Secondary | ICD-10-CM | POA: Diagnosis not present

## 2018-07-30 DIAGNOSIS — E785 Hyperlipidemia, unspecified: Secondary | ICD-10-CM | POA: Diagnosis not present

## 2018-07-30 DIAGNOSIS — M109 Gout, unspecified: Secondary | ICD-10-CM | POA: Diagnosis not present

## 2018-07-30 DIAGNOSIS — D509 Iron deficiency anemia, unspecified: Secondary | ICD-10-CM | POA: Diagnosis not present

## 2018-07-30 DIAGNOSIS — E1142 Type 2 diabetes mellitus with diabetic polyneuropathy: Secondary | ICD-10-CM | POA: Diagnosis not present

## 2018-08-14 DIAGNOSIS — J440 Chronic obstructive pulmonary disease with acute lower respiratory infection: Secondary | ICD-10-CM | POA: Diagnosis not present

## 2018-08-14 DIAGNOSIS — J019 Acute sinusitis, unspecified: Secondary | ICD-10-CM | POA: Diagnosis not present

## 2018-08-25 DIAGNOSIS — J440 Chronic obstructive pulmonary disease with acute lower respiratory infection: Secondary | ICD-10-CM | POA: Diagnosis not present

## 2018-08-25 DIAGNOSIS — J301 Allergic rhinitis due to pollen: Secondary | ICD-10-CM | POA: Diagnosis not present

## 2018-08-25 DIAGNOSIS — I10 Essential (primary) hypertension: Secondary | ICD-10-CM | POA: Diagnosis not present

## 2018-08-26 DIAGNOSIS — R0602 Shortness of breath: Secondary | ICD-10-CM | POA: Diagnosis not present

## 2018-09-08 ENCOUNTER — Telehealth: Payer: Self-pay | Admitting: Physician Assistant

## 2018-09-08 NOTE — Telephone Encounter (Signed)
Per pt's wife call - stated pt needs to be scheduled for an Echo and see the PA same day following up on his surgery.  Please put and order in for an Echo. Please give them a call to schedule.

## 2018-09-08 NOTE — Telephone Encounter (Signed)
Is patient using Smartphone/computer/tablet? Does not have  Did audio/video work?n/a  Does patient need telephone visit? yes  Best phone number to use? Use home number   Special Instructions? bp bounces up and down  It varies like this: 169/64 106/62     Virtual Visit Pre-Appointment Phone Call  Steps For Call:  1. Confirm consent - "In the setting of the current Covid19 crisis, you are scheduled for a (phone or video) visit with your provider on (date) at (time).  Just as we do with many in-office visits, in order for you to participate in this visit, we must obtain consent.  If you'd like, I can send this to your mychart (if signed up) or email for you to review.  Otherwise, I can obtain your verbal consent now.  All virtual visits are billed to your insurance company just like a normal visit would be.  By agreeing to a virtual visit, we'd like you to understand that the technology does not allow for your provider to perform an examination, and thus may limit your provider's ability to fully assess your condition.  Finally, though the technology is pretty good, we cannot assure that it will always work on either your or our end, and in the setting of a video visit, we may have to convert it to a phone-only visit.  In either situation, we cannot ensure that we have a secure connection.  Are you willing to proceed?"  2. Give patient instructions for WebEx download to smartphone as below if video visit  3. Advise patient to be prepared with any vital sign or heart rhythm information, their current medicines, and a piece of paper and pen handy for any instructions they may receive the day of their visit  4. Inform patient they will receive a phone call 15 minutes prior to their appointment time (may be from unknown caller ID) so they should be prepared to answer  5. Confirm that appointment type is correct in Epic appointment notes (video vs telephone)    TELEPHONE CALL NOTE  [name] has been deemed a candidate for a follow-up tele-health visit to limit community exposure during the Covid-19 pandemic. I spoke with the patient via phone to ensure availability of phone/video source, confirm preferred email & phone number, and discuss instructions and expectations.  I reminded [name] to be prepared with any vital sign and/or heart rhythm information that could potentially be obtained via home monitoring, at the time of his visit. I reminded [name] to expect a phone call at the time of his visit if his visit.  Did the patient verbally acknowledge consent to treatment? Yes, patient will have current bp reading, weight and medication list at time of visit  [name] 09/08/2018 10:21 AM

## 2018-09-08 NOTE — Telephone Encounter (Signed)
Romie Minus, can you get Mr. Degroff a virtual visit with me tomorrow and schedule an echo for July sometime. Thank you!

## 2018-09-08 NOTE — Telephone Encounter (Signed)
Called and spoke with wife. She states that her paperwork from her husbands valve replacement information states from Angelena Form, [place] with the heart and vascular center to have repeat ECHO and appointment in 6 months. They are running up on the need for those appointments, I advised with the virus I thought they were only doing emergent cases for the [facility] but I would be glad to send this to the PA to decide on if appointment was needed, and the order and need for ECHO.   Patient wife thankful for call.  Will route.

## 2018-09-09 ENCOUNTER — Other Ambulatory Visit: Payer: Self-pay

## 2018-09-09 ENCOUNTER — Other Ambulatory Visit: Payer: Self-pay | Admitting: Physician Assistant

## 2018-09-09 ENCOUNTER — Telehealth (INDEPENDENT_AMBULATORY_CARE_PROVIDER_SITE_OTHER): Payer: Medicare Other | Admitting: Physician Assistant

## 2018-09-09 VITALS — BP 129/54 | HR 85 | Wt 205.0 lb

## 2018-09-09 DIAGNOSIS — I10 Essential (primary) hypertension: Secondary | ICD-10-CM

## 2018-09-09 DIAGNOSIS — Z952 Presence of prosthetic heart valve: Secondary | ICD-10-CM

## 2018-09-09 MED ORDER — AMOXICILLIN 500 MG PO TABS: 2000.0000 mg | ORAL_TABLET | ORAL | 12 refills | Status: DC

## 2018-09-09 NOTE — Patient Instructions (Signed)
Hello [name],   It was so nice to talk to you on the phone today. I am so glad you are doing so well. I just wanted to send you a recap of our discussion.   Please continue taking antibiotics prior to any dental work including cleanings; a refill has been sent to [facility]. Please continue on your Pradaxa indefinitely.   Your 1 year echo has been rescheduled to July.  All appointment details are attached to this letter in your "after visit summary."  Please call us with any questions or concerns you may have and please stay safe during these uncertain times.  Nell Range

## 2018-09-09 NOTE — Progress Notes (Signed)
HEART AND VASCULAR CENTER   MULTIDISCIPLINARY HEART VALVE TEAM   Evaluation Performed:  Follow-up visit  This visit type was conducted due to national recommendations for restrictions regarding the COVID-19 Pandemic (e.g. social distancing).  This format is felt to be most appropriate for this patient at this time.  All issues noted in this document were discussed and addressed.  No physical exam was performed (except for noted visual exam findings with Telehealth visits).  The patient has consented to conduct a Telehealth visit and understands insurance will be billed.   Date:  09/09/2018   ID:  [name], DOB 02-25-1932, MRN [id number]  Patient Location:  [address] [place] [address]   Provider location:   [address] [place], [place] [address]  PCP:  Nicoletta Dress, MD  Cardiologist:  Dr. Stanford Breed / Dr. Cyndia Bent & Dr. Burt Knack (TAVR)   Chief Complaint:  1 year follow up s/p TAVR  History of Present Illness:    [name] is a 85 y.o. male with a history of CAD s/p multivessel CABG (1994), chronic atrial fibrillation on pradaxa, COPD, DMT2, PAD, carotid artery disease s/p CEA, HTN and severe AS s/p TAVR (09/17/17) who presents via audio conferencing for a telehealth visit today.    The patient does not symptoms concerning for COVID-19 infection (fever, chills, cough, or new SHORTNESS OF BREATH).   He has known of the presence of a heart murmur for many years and he has been followed by Dr. Stanford Breed regularly. Echocardiograms have documented gradual progression and severity of aortic stenosis. Follow-up echocardiogram 07/29/2017 revealed severe aortic stenosis with peak velocity across the aortic valve measured 4.6 m/s corresponding to mean transvalvular gradient estimated 51 mmHg. The DVI was 0.18 corresponding to aortic valve area calculated 0.56 cm.Left ventricular systolic function remained normal with ejection fraction estimated 55-60%. There was also mild to moderate  mitral stenosis with mean transvalvular gradient across the mitral valve calculated 8 mmHg and valve area calculated 1.69 cm by pressure half-time. The patient was referred to Dr. Burt Knack and underwent diagnostic cardiac catheterization on August 14, 2017. Catheterization revealed severe multivessel coronary artery disease with chronic total occlusion of the left anterior descending coronary artery, the right coronary artery, and the left circumflex coronary artery. There was continued patency of the left internal mammary artery graft to the distal left anterior descending coronary artery. There was also continued patency of the saphenous vein graft to the left circumflex system.The right coronary artery filled via left-to-right collaterals. There was moderate stenosis of the apical LAD at the site of previous angioplasty. There was severe aortic stenosis and severe pulmonary hypertension. Peak to peak and mean transvalvular gradients across the aortic valve were measured 34 and 38 mmHg corresponding to aortic valve area calculated 0.89 cm. The patient was referred to the multidisciplinary valve team for potential TAVR. During his work up a possible gastric mass was found on CT and non emergent endoscopy was recommended. The patient elected to follow up with his own GI doctor about this.   He underwent succesful placement of a 26 mm Edwards Sapein 3 THV via the left subclavian approach on 09/17/17. He was discharged on ASA 30m daily and Pradaxa. 1 month echo showed EF 55%, normally functioning TAVR valve with mean gradient 14 mm Hg and no PVL.  Today he presents via telephone for 1 year visit. No CP or SOB. No LE edema, orthopnea or PND. No dizziness or syncope. No blood in stool  or urine. No palpitations. He is doing quite well and staying safe during pandemic.      Prior CV studies:   The following studies were reviewed today:  TAVR OPERATIVE NOTE   Date of Procedure:09/17/2017   Preoperative Diagnosis:Severe Aortic Stenosis   Postoperative Diagnosis:Same   Procedure:  Transcatheter Aortic Valve Replacement -Left Subclavian ApproachEdwards Sapien 3 THV (size 41m, model # 9600TFX, serial # [id number]  Co-Surgeons:Bryan KAlveria Apley MD and MSherren Mocha MD  Anesthesiologist:Dr STobias Alexander Echocardiographer:Dr Nelson  Pre-operative Echo Findings: ? Severe aortic stenosis ? Normalleft ventricular systolic function  Post-operative Echo Findings: ? Noparavalvular leak ? Normalleft ventricular systolic function  ______________  2D ECHO 10/24/17 (30 day post op) Study Conclusions - Left ventricle: The cavity size was mildly dilated. Wall thickness was normal. Systolic function was normal. The estimated ejection fraction was in the range of 55% to 60%. Wall motion was normal; there were no regional wall motion abnormalities. - Aortic valve: A bioprosthesis was present. Mean gradient (S): 14 mm Hg. Peak gradient (S): 29 mm Hg. - Aortic root: The aortic root was mildly dilated. - Mitral valve: Calcified annulus. Mildly thickened leaflets . The findings are consistent with mild to moderate stenosis. There was mild regurgitation. - Left atrium: The atrium was severely dilated. - Right atrium: The atrium was mildly dilated. - Pulmonary arteries: Systolic pressure was moderately increased. Impressions: - Normal LV systolic function; mild LVE; s/p AVR with mean gradient 14 mmHg and no AI; mildly dilated aortic root; MAC with moderate MS (mean gradient 12 mmHg and MVA 1.5 cm2 by pressure halft time) and mild MR; severe LAE; mild RAE; trace TR with moderate pulmonary hypertension.    Past Medical History:  Diagnosis Date  . Acute myocardial infarction, unspecified site, initial episode of care   . Anemia   . Anxiety   . Aortic stenosis    . Atrial  fibrillation ([facility]    a. permanent, on Coumadin for anticoagulation  . CAD (coronary artery disease)    a. s/p CABG in 1994 b. cath in 2015 showing normal LM, 100% LCx and RCA stenosis with 50-60% prox LAD stenosis and 100% mid-LAD stenosis; patent sequential SVG-OM2-OM3 and patent LIMA-LAD with PTCA of distal LAD via LIMA graft performed at that time  . Carotid stenosis   . CHF (congestive heart failure) ([facility]   . COPD (chronic obstructive pulmonary disease) ([facility]   . Depression   . Diabetes mellitus without complication ([facility]    FASTING 98-120S  . GERD (gastroesophageal reflux disease)   . History of blood transfusion   . History of peptic ulcer disease   . Hyperlipidemia   . Hypertension   . Myocardial infarction ([facility]    X2  . Peripheral vascular disease ([facility]   . Pneumonia    HX OF  . Renal artery stenosis ([facility]   . Sleep apnea    OXYGEN AT NIGHT 2L [place]   Past Surgical History:  Procedure Laterality Date  . AORTIC ARCH ANGIOGRAPHY N/A 08/14/2017   Procedure: AORTIC ARCH ANGIOGRAPHY;  Surgeon: CSherren Mocha MD;  [facility];  Service: Cardiovascular;  Laterality: N/A;  . CARDIAC CATHETERIZATION    . CAROTID ENDARTERECTOMY    . COLON SURGERY    . CORONARY ARTERY BYPASS GRAFT  1994  . ENDARTERECTOMY Right 01/05/2013   Procedure: ENDARTERECTOMY CAROTID;  Surgeon: TRosetta Posner MD;  [facility]  Service: Vascular;  Laterality: Right;  . KNEE SURGERY  08/2003  left knee/ARTHROSCOPIC  . LEFT HEART CATHETERIZATION WITH CORONARY/GRAFT ANGIOGRAM N/A 09/01/2013   Procedure: LEFT HEART CATHETERIZATION WITH Beatrix Fetters;  Surgeon: Sinclair Grooms, MD;  Location: [facility] C[facility] LAB;  Service: Cardiovascular;  Laterality: N/A;  . PATCH ANGIOPLASTY Right 01/05/2013   Procedure: PATCH ANGIOPLASTY;  Surgeon: Rosetta Posner, MD;  Location: [place];  Service: Vascular;  Laterality: Right;  . PERCUTANEOUS CORONARY STENT INTERVENTION (PCI-S)  09/01/2013   Procedure:  PERCUTANEOUS CORONARY STENT INTERVENTION (PCI-S);  Surgeon: Sinclair Grooms, MD;  Location[facility] C[facility] LAB;  Service: Cardiovascular;;  . removal of bleeding ulcer  1994  . RENAL ARTERY STENT     stenting of the left renal artery as well as a cutting balloon angioplasty for treatment of in-stent restenosis coronary artery bypass grafting in 1994.  [name] RIGHT/LEFT HEART CATH AND CORONARY/GRAFT ANGIOGRAPHY N/A 08/14/2017   Procedure: RIGHT/LEFT HEART CATH AND CORONARY/GRAFT ANGIOGRAPHY;  Surgeon: Sherren Mocha, MD;  Location: [place] CV LAB;  Service: Cardiovascular;  Laterality: N/A;  . TEE WITHOUT CARDIOVERSION N/A 09/17/2017   Procedure: TRANSESOPHAGEAL ECHOCARDIOGRAM (TEE);  Surgeon: Sherren Mocha, MD;  Location: [place];  Service: Open Heart Surgery;  Laterality: N/A;     Current Meds  Medication Sig  . acetaminophen (TYLENOL) 500 MG tablet Take 1,000 mg by mouth every 6 (six) hours as needed for moderate pain or headache.   [name] acyclovir (ZOVIRAX) 800 MG tablet Take 400 mg by mouth 2 (two) times daily.   [name] albuterol (PROVENTIL,VENTOLIN) 90 MCG/ACT inhaler Inhale 2 puffs into the lungs every 4 (four) hours as needed for shortness of breath.   [name] amoxicillin (AMOXIL) 500 MG tablet Take 4 tablets (2,000 mg total) by mouth as directed. Take one hour prior to dental visits.  [name] atorvastatin (LIPITOR) 80 MG tablet Take 1 tablet (80 mg total) by mouth at bedtime.  . benazepril (LOTENSIN) 20 MG tablet Take 10 mg by mouth daily.  . brimonidine (ALPHAGAN) 0.2 % ophthalmic solution Place 1 drop into both eyes 2 (two) times daily.  . Cholecalciferol (VITAMIN D) 2000 UNITS CAPS Take 2,000 Units by mouth daily.  [name] CLARITIN 10 MG tablet Take 10 mg by mouth daily.  . dabigatran (PRADAXA) 150 MG CAPS Take 150 mg by mouth 2 (two) times daily.   . ferrous sulfate 325 (65 FE) MG tablet Take 325 mg by mouth daily with breakfast.   . furosemide (LASIX) 20 MG tablet Take 2 tablets (40 mg total) by mouth daily.  [name]  ipratropium-albuterol (DUONEB) 0.5-2.5 (3) MG/3ML SOLN Take 3 mLs by nebulization 3 (three) times daily.   . lansoprazole (PREVACID) 30 MG capsule Take 30 mg by mouth daily.    . metFORMIN (GLUCOPHAGE) 500 MG tablet Take 500 mg by mouth daily with breakfast.  . metoprolol (LOPRESSOR) 50 MG tablet Take 1 tablet (50 mg total) by mouth 2 (two) times daily.  . Multiple Vitamin (MULTIVITAMIN WITH MINERALS) TABS Take 1 tablet by mouth daily.  . multivitamin-lutein (OCUVITE-LUTEIN) CAPS Take 1 capsule by mouth daily.  . predniSONE (DELTASONE) 20 MG tablet Take 20-60 mg by mouth daily as needed (for gout attack).   . terazosin (HYTRIN) 5 MG capsule Take 5 mg by mouth at bedtime.   . trolamine salicylate (ASPERCREME) 10 % cream Apply 1 application topically 4 (four) times daily as needed for muscle pain.   . [DISCONTINUED] amoxicillin (AMOXIL) 500 MG tablet Take 4 tablets (2,000 mg total) by mouth as directed. Take one hour prior  to dental visits.     Allergies:   Bactrim [sulfamethoxazole-trimethoprim] and Levaquin [levofloxacin in d5w]   Social History   Tobacco Use  . Smoking status: Former Smoker    Last attempt to quit: 04/27/1993    Years since quitting: 25.3  . Smokeless tobacco: Never Used  Substance Use Topics  . Alcohol use: No    Alcohol/week: 0.0 standard drinks  . Drug use: No     Family Hx: The patient's family history includes Coronary artery disease in his father, mother, and another family member; Heart attack in his father and mother; Heart disease in his brother, father, mother, sister, and another family member; Hyperlipidemia in his mother; Hypertension in his mother.  ROS:   Please see the history of present illness.    All other systems reviewed and are negative.   Labs/Other Tests and Data Reviewed:    Recent Labs: 09/13/2017: ALT 23; B Natriuretic Peptide 245.9 09/18/2017: BUN 8; Creatinine, Ser 1.02; Hemoglobin 11.6; Magnesium 1.9; Platelets 168; Potassium 4.0;  Sodium 137   Recent Lipid Panel Lab Results  Component Value Date/Time   CHOL 142 11/17/2012 09:09 AM   TRIG 90.0 11/17/2012 09:09 AM   HDL 46.80 11/17/2012 09:09 AM   CHOLHDL 3 11/17/2012 09:09 AM   LDLCALC 77 11/17/2012 09:09 AM   LDLDIRECT 67.6 09/08/2009 12:00 AM    Wt Readings from Last 3 Encounters:  09/09/18 205 lb (93 kg)  05/19/18 205 lb (93 kg)  11/18/17 212 lb (96.2 kg)     Exam:    Vitals:   09/09/18 1033  BP: (!) 129/54  Pulse: 85  SpO2: 92%    Not completed as visit conducted over the phone  ASSESSMENT & PLAN:    Severe AS s/p TAVR: he is doing quite well with NYHA class I symptoms. He understands the ongoing need for SBE prophylaxis. Continue on Pradaxa for chronic afib. 1 year echo pushed out until July given Covid 19 pandemic.   HTN: BP well controlled today. Review of logs from wife show good control  Incidental findings: during his work up a possible gastric mass was found on CT and non emergent endoscopy was recommended. He had a follow up EGD by Dr. Melina Copa and no mass was found.    COVID-19 Education: The signs and symptoms of COVID-19 were discussed with the patient and how to seek care for testing (follow up with PCP or arrange E-visit).  The importance of social distancing was discussed today.  Patient Risk:   After full review of this patients clinical status, I feel that they are at least moderate risk at this time.  Time:   Today, I have spent 15 minutes with the patient with telehealth technology discussing post surgical recovery, symptoms and instructions going forward.     Medication Adjustments/Labs and Tests Ordered: Current medicines are reviewed at length with the patient today.  Concerns regarding medicines are outlined above.  Tests Ordered: No orders of the defined types were placed in this encounter.  Medication Changes: Meds ordered this encounter  Medications  . amoxicillin (AMOXIL) 500 MG tablet    Sig: Take 4  tablets (2,000 mg total) by mouth as directed. Take one hour prior to dental visits.    Dispense:  12 tablet    Refill:  12    Order Specific Question:   Supervising Provider    Answer:   Sherren Mocha [[id number]]    Disposition:  Echo in July, Dr. Stanford Breed in 05/2019  Signed, Angelena Form, PA-C  09/09/2018 11:08 AM    [facility] [place], [place], [place]  [address] Phone: [phone]; Fax: [phone]

## 2018-09-24 DIAGNOSIS — J440 Chronic obstructive pulmonary disease with acute lower respiratory infection: Secondary | ICD-10-CM | POA: Diagnosis not present

## 2018-11-04 DIAGNOSIS — Z0000 Encounter for general adult medical examination without abnormal findings: Secondary | ICD-10-CM | POA: Diagnosis not present

## 2018-11-04 DIAGNOSIS — Z1331 Encounter for screening for depression: Secondary | ICD-10-CM | POA: Diagnosis not present

## 2018-11-04 DIAGNOSIS — Z136 Encounter for screening for cardiovascular disorders: Secondary | ICD-10-CM | POA: Diagnosis not present

## 2018-11-04 DIAGNOSIS — E785 Hyperlipidemia, unspecified: Secondary | ICD-10-CM | POA: Diagnosis not present

## 2018-11-04 DIAGNOSIS — Z9181 History of falling: Secondary | ICD-10-CM | POA: Diagnosis not present

## 2018-11-05 DIAGNOSIS — E1142 Type 2 diabetes mellitus with diabetic polyneuropathy: Secondary | ICD-10-CM | POA: Diagnosis not present

## 2018-11-05 DIAGNOSIS — Z1331 Encounter for screening for depression: Secondary | ICD-10-CM | POA: Diagnosis not present

## 2018-11-05 DIAGNOSIS — Z9861 Coronary angioplasty status: Secondary | ICD-10-CM | POA: Diagnosis not present

## 2018-11-05 DIAGNOSIS — D509 Iron deficiency anemia, unspecified: Secondary | ICD-10-CM | POA: Diagnosis not present

## 2018-11-05 DIAGNOSIS — E785 Hyperlipidemia, unspecified: Secondary | ICD-10-CM | POA: Diagnosis not present

## 2018-11-05 DIAGNOSIS — Z9181 History of falling: Secondary | ICD-10-CM | POA: Diagnosis not present

## 2018-11-05 DIAGNOSIS — I2510 Atherosclerotic heart disease of native coronary artery without angina pectoris: Secondary | ICD-10-CM | POA: Diagnosis not present

## 2018-11-15 ENCOUNTER — Encounter (HOSPITAL_COMMUNITY): Payer: Self-pay | Admitting: Emergency Medicine

## 2018-11-15 ENCOUNTER — Other Ambulatory Visit: Payer: Self-pay

## 2018-11-15 ENCOUNTER — Inpatient Hospital Stay (HOSPITAL_COMMUNITY)
Admission: EM | Admit: 2018-11-15 | Discharge: 2018-11-26 | DRG: 579 | Disposition: A | Discharge status: Home Health | Payer: Medicare Other | Attending: Family Medicine | Admitting: Family Medicine

## 2018-11-15 ENCOUNTER — Emergency Department (HOSPITAL_COMMUNITY): Payer: Medicare Other

## 2018-11-15 DIAGNOSIS — I2699 Other pulmonary embolism without acute cor pulmonale: Secondary | ICD-10-CM | POA: Diagnosis not present

## 2018-11-15 DIAGNOSIS — E785 Hyperlipidemia, unspecified: Secondary | ICD-10-CM | POA: Diagnosis present

## 2018-11-15 DIAGNOSIS — I701 Atherosclerosis of renal artery: Secondary | ICD-10-CM | POA: Diagnosis present

## 2018-11-15 DIAGNOSIS — M60032 Infective myositis, left forearm: Secondary | ICD-10-CM | POA: Diagnosis not present

## 2018-11-15 DIAGNOSIS — E1151 Type 2 diabetes mellitus with diabetic peripheral angiopathy without gangrene: Secondary | ICD-10-CM | POA: Diagnosis present

## 2018-11-15 DIAGNOSIS — I4891 Unspecified atrial fibrillation: Secondary | ICD-10-CM | POA: Diagnosis not present

## 2018-11-15 DIAGNOSIS — Z7901 Long term (current) use of anticoagulants: Secondary | ICD-10-CM | POA: Diagnosis not present

## 2018-11-15 DIAGNOSIS — I10 Essential (primary) hypertension: Secondary | ICD-10-CM | POA: Diagnosis not present

## 2018-11-15 DIAGNOSIS — I5033 Acute on chronic diastolic (congestive) heart failure: Secondary | ICD-10-CM | POA: Diagnosis present

## 2018-11-15 DIAGNOSIS — Z955 Presence of coronary angioplasty implant and graft: Secondary | ICD-10-CM | POA: Diagnosis not present

## 2018-11-15 DIAGNOSIS — L03818 Cellulitis of other sites: Secondary | ICD-10-CM

## 2018-11-15 DIAGNOSIS — L089 Local infection of the skin and subcutaneous tissue, unspecified: Secondary | ICD-10-CM | POA: Diagnosis not present

## 2018-11-15 DIAGNOSIS — R0602 Shortness of breath: Secondary | ICD-10-CM

## 2018-11-15 DIAGNOSIS — I252 Old myocardial infarction: Secondary | ICD-10-CM

## 2018-11-15 DIAGNOSIS — J441 Chronic obstructive pulmonary disease with (acute) exacerbation: Secondary | ICD-10-CM | POA: Diagnosis not present

## 2018-11-15 DIAGNOSIS — I447 Left bundle-branch block, unspecified: Secondary | ICD-10-CM | POA: Diagnosis present

## 2018-11-15 DIAGNOSIS — G4730 Sleep apnea, unspecified: Secondary | ICD-10-CM | POA: Diagnosis present

## 2018-11-15 DIAGNOSIS — I482 Chronic atrial fibrillation, unspecified: Secondary | ICD-10-CM | POA: Diagnosis present

## 2018-11-15 DIAGNOSIS — M109 Gout, unspecified: Secondary | ICD-10-CM | POA: Diagnosis present

## 2018-11-15 DIAGNOSIS — Z20828 Contact with and (suspected) exposure to other viral communicable diseases: Secondary | ICD-10-CM | POA: Diagnosis present

## 2018-11-15 DIAGNOSIS — N400 Enlarged prostate without lower urinary tract symptoms: Secondary | ICD-10-CM | POA: Diagnosis present

## 2018-11-15 DIAGNOSIS — K219 Gastro-esophageal reflux disease without esophagitis: Secondary | ICD-10-CM | POA: Diagnosis present

## 2018-11-15 DIAGNOSIS — R0902 Hypoxemia: Secondary | ICD-10-CM | POA: Diagnosis not present

## 2018-11-15 DIAGNOSIS — Z9981 Dependence on supplemental oxygen: Secondary | ICD-10-CM

## 2018-11-15 DIAGNOSIS — R509 Fever, unspecified: Secondary | ICD-10-CM | POA: Diagnosis not present

## 2018-11-15 DIAGNOSIS — I2510 Atherosclerotic heart disease of native coronary artery without angina pectoris: Secondary | ICD-10-CM | POA: Diagnosis present

## 2018-11-15 DIAGNOSIS — Z8249 Family history of ischemic heart disease and other diseases of the circulatory system: Secondary | ICD-10-CM | POA: Diagnosis not present

## 2018-11-15 DIAGNOSIS — I342 Nonrheumatic mitral (valve) stenosis: Secondary | ICD-10-CM | POA: Diagnosis not present

## 2018-11-15 DIAGNOSIS — L0390 Cellulitis, unspecified: Secondary | ICD-10-CM | POA: Diagnosis present

## 2018-11-15 DIAGNOSIS — J9621 Acute and chronic respiratory failure with hypoxia: Secondary | ICD-10-CM | POA: Diagnosis not present

## 2018-11-15 DIAGNOSIS — L03114 Cellulitis of left upper limb: Principal | ICD-10-CM | POA: Diagnosis present

## 2018-11-15 DIAGNOSIS — I110 Hypertensive heart disease with heart failure: Secondary | ICD-10-CM | POA: Diagnosis present

## 2018-11-15 DIAGNOSIS — Z7984 Long term (current) use of oral hypoglycemic drugs: Secondary | ICD-10-CM

## 2018-11-15 DIAGNOSIS — Z952 Presence of prosthetic heart valve: Secondary | ICD-10-CM | POA: Diagnosis not present

## 2018-11-15 DIAGNOSIS — Z23 Encounter for immunization: Secondary | ICD-10-CM | POA: Diagnosis not present

## 2018-11-15 DIAGNOSIS — Z87891 Personal history of nicotine dependence: Secondary | ICD-10-CM

## 2018-11-15 DIAGNOSIS — Z8711 Personal history of peptic ulcer disease: Secondary | ICD-10-CM

## 2018-11-15 DIAGNOSIS — Z6828 Body mass index (BMI) 28.0-28.9, adult: Secondary | ICD-10-CM | POA: Diagnosis not present

## 2018-11-15 DIAGNOSIS — D509 Iron deficiency anemia, unspecified: Secondary | ICD-10-CM | POA: Diagnosis present

## 2018-11-15 DIAGNOSIS — Y92012 Bathroom of single-family (private) house as the place of occurrence of the external cause: Secondary | ICD-10-CM | POA: Diagnosis not present

## 2018-11-15 DIAGNOSIS — R05 Cough: Secondary | ICD-10-CM | POA: Diagnosis not present

## 2018-11-15 DIAGNOSIS — Z8349 Family history of other endocrine, nutritional and metabolic diseases: Secondary | ICD-10-CM

## 2018-11-15 DIAGNOSIS — W228XXA Striking against or struck by other objects, initial encounter: Secondary | ICD-10-CM | POA: Diagnosis present

## 2018-11-15 DIAGNOSIS — Z951 Presence of aortocoronary bypass graft: Secondary | ICD-10-CM

## 2018-11-15 DIAGNOSIS — J9611 Chronic respiratory failure with hypoxia: Secondary | ICD-10-CM | POA: Diagnosis not present

## 2018-11-15 DIAGNOSIS — R52 Pain, unspecified: Secondary | ICD-10-CM | POA: Diagnosis not present

## 2018-11-15 DIAGNOSIS — S61402A Unspecified open wound of left hand, initial encounter: Secondary | ICD-10-CM | POA: Diagnosis not present

## 2018-11-15 DIAGNOSIS — I480 Paroxysmal atrial fibrillation: Secondary | ICD-10-CM | POA: Diagnosis not present

## 2018-11-15 DIAGNOSIS — Z881 Allergy status to other antibiotic agents status: Secondary | ICD-10-CM

## 2018-11-15 DIAGNOSIS — I361 Nonrheumatic tricuspid (valve) insufficiency: Secondary | ICD-10-CM | POA: Diagnosis not present

## 2018-11-15 HISTORY — DX: Cellulitis, unspecified: L03.90

## 2018-11-15 LAB — CBC WITH DIFFERENTIAL/PLATELET
Abs Immature Granulocytes: 0.05 10*3/uL (ref 0.00–0.07)
Basophils Absolute: 0 10*3/uL (ref 0.0–0.1)
Basophils Relative: 0 %
Eosinophils Absolute: 0.1 10*3/uL (ref 0.0–0.5)
Eosinophils Relative: 1 %
HCT: 39.6 % (ref 39.0–52.0)
Hemoglobin: 13 g/dL (ref 13.0–17.0)
Immature Granulocytes: 0 %
Lymphocytes Relative: 7 %
Lymphs Abs: 0.8 10*3/uL (ref 0.7–4.0)
MCH: 29.8 pg (ref 26.0–34.0)
MCHC: 32.8 g/dL (ref 30.0–36.0)
MCV: 90.8 fL (ref 80.0–100.0)
Monocytes Absolute: 0.9 10*3/uL (ref 0.1–1.0)
Monocytes Relative: 7 %
Neutro Abs: 10.3 10*3/uL — ABNORMAL HIGH (ref 1.7–7.7)
Neutrophils Relative %: 85 %
Platelets: 188 10*3/uL (ref 150–400)
RBC: 4.36 MIL/uL (ref 4.22–5.81)
RDW: 14.7 % (ref 11.5–15.5)
WBC: 12.1 10*3/uL — ABNORMAL HIGH (ref 4.0–10.5)
nRBC: 0 % (ref 0.0–0.2)

## 2018-11-15 LAB — COMPREHENSIVE METABOLIC PANEL
ALT: 28 U/L (ref 0–44)
AST: 23 U/L (ref 15–41)
Albumin: 3.6 g/dL (ref 3.5–5.0)
Alkaline Phosphatase: 101 U/L (ref 38–126)
Anion gap: 9 (ref 5–15)
BUN: 15 mg/dL (ref 8–23)
CO2: 25 mmol/L (ref 22–32)
Calcium: 8.9 mg/dL (ref 8.9–10.3)
Chloride: 100 mmol/L (ref 98–111)
Creatinine, Ser: 1.04 mg/dL (ref 0.61–1.24)
GFR calc Af Amer: >60 mL/min (ref >60)
GFR calc non Af Amer: >60 mL/min (ref >60)
Glucose, Bld: 220 mg/dL — ABNORMAL HIGH (ref 70–99)
Potassium: 4.1 mmol/L (ref 3.5–5.1)
Sodium: 134 mmol/L — ABNORMAL LOW (ref 135–145)
Total Bilirubin: 1.1 mg/dL (ref 0.3–1.2)
Total Protein: 6 g/dL — ABNORMAL LOW (ref 6.5–8.1)

## 2018-11-15 LAB — LACTIC ACID, PLASMA: Lactic Acid, Venous: 1.7 mmol/L (ref 0.5–1.9)

## 2018-11-15 LAB — PROTIME-INR
INR: 1.3 — ABNORMAL HIGH (ref 0.8–1.2)
Prothrombin Time: 15.8 seconds — ABNORMAL HIGH (ref 11.4–15.2)

## 2018-11-15 LAB — SARS CORONAVIRUS 2: SARS Coronavirus 2: NOT DETECTED

## 2018-11-15 MED ORDER — SODIUM CHLORIDE 0.9% FLUSH: 3.0000 mL | Freq: Once | INTRAVENOUS | Status: DC

## 2018-11-15 MED ORDER — MORPHINE SULFATE (PF) 2 MG/ML IV SOLN
2.0000 mg | INTRAVENOUS | Status: DC | PRN
  Administered 2018-11-15 – 2018-11-19 (×12): 2 mg via INTRAVENOUS
  Filled 2018-11-15 (×12): qty 1

## 2018-11-15 MED ORDER — CEFAZOLIN SODIUM-DEXTROSE 1-4 GM/50ML-% IV SOLN
1.0000 g | Freq: Once | INTRAVENOUS | Status: AC
  Administered 2018-11-15: 1 g via INTRAVENOUS
  Filled 2018-11-15: qty 50

## 2018-11-15 MED ORDER — SODIUM CHLORIDE 0.9 % IV BOLUS
1000.0000 mL | Freq: Once | INTRAVENOUS | Status: AC
  Administered 2018-11-15: 1000 mL via INTRAVENOUS

## 2018-11-15 NOTE — ED Provider Notes (Addendum)
[facility] EMERGENCY DEPARTMENT Provider Note   CSN: [id number] Arrival date & time: 11/15/18  1945  History   Chief Complaint Chief Complaint  Patient presents with  . Recurrent Skin Infections    HPI [name] is a 85 y.o. male with PMH significant for PUD, anemia, HLD, aortic stenosis s/p TAVR, PVD, afib on coumadin, RAS s/p stent, CAD s/p CABG 1994, HTN, COPD, sleep apnea, DM, GERD, depression/anxiety, diastolic CHF here for L forearm redness, pain and fever after he hit his arm on a bathroom door at home 4 days ago. Denies falls. Tmax 101.62F this afternoon. Has been able to move arm ok but with significant pain. Is eating and drinking normally, acting normally. Pain is inhibiting his sleep. Saw his PCP in [place] today, Dr. Delena Bali, who gave him an antibiotic shot and Tdap and told him to go to the ED. Denies recent medication changes and reports compliance.     Past Medical History:  Diagnosis Date  . Acute myocardial infarction, unspecified site, initial episode of care   . Anemia   . Anxiety   . Aortic stenosis    . Atrial fibrillation ([place])    a. permanent, on Coumadin for anticoagulation  . CAD (coronary artery disease)    a. s/p CABG in 1994 b. cath in 2015 showing normal LM, 100% LCx and RCA stenosis with 50-60% prox LAD stenosis and 100% mid-LAD stenosis; patent sequential SVG-OM2-OM3 and patent LIMA-LAD with PTCA of distal LAD via LIMA graft performed at that time  . Carotid stenosis   . CHF (congestive heart failure) ([place])   . COPD (chronic obstructive pulmonary disease) ([place])   . Depression   . Diabetes mellitus without complication ([place])    FASTING 98-120S  . GERD (gastroesophageal reflux disease)   . History of blood transfusion   . History of peptic ulcer disease   . Hyperlipidemia   . Hypertension   . Myocardial infarction ([place])    X2  . Peripheral vascular disease ([place])   . Pneumonia    HX OF  . Renal artery stenosis ([place])   .  Sleep apnea    OXYGEN AT NIGHT 2L [place]    Patient Active Problem List   Diagnosis Date Noted  . 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 Severe aortic stenosis 09/17/2017  . Mitral stenosis   . Aftercare following surgery of the circulatory system 03/15/2014  . Carotid stenosis 03/15/2014  . Unstable angina ([place]) 08/31/2013  . Acute myocardial infarction, unspecified site, initial episode of care   . Aortic stenosis   . Atrial fibrillation ([place])   . Vertigo 06/27/2013  . Chest pain with moderate risk of acute coronary syndrome 06/27/2013  . CAD - CABG '94, low risk Myoview May 2014 06/27/2013  . Chronic atrial fibrillation 06/27/2013  . Chronic anticoagulation 06/27/2013  . Gout attack- on steroid dose pack 06/27/2013  . Tachycardia- beta blocker increased 06/26/2013  . Essential hypertension, benign 03/10/2009  . HYPERLIPIDEMIA 09/09/2008  . UNSPECIFIED ANEMIA 09/09/2008  . CAROTID STENOSIS- moderate 09/09/2008  . UNSPECIFIED CEREBROVASCULAR DISEASE 09/09/2008  . RENAL ARTERY STENOSIS- moderate 09/09/2008  . PERIPHERAL VASCULAR DISEASE 09/09/2008  . PEPTIC ULCER DISEASE, HX OF 09/09/2008    Past Surgical History:  Procedure Laterality Date  . AORTIC ARCH ANGIOGRAPHY N/A 08/14/2017   Procedure: AORTIC ARCH ANGIOGRAPHY;  Surgeon: Sherren Mocha, MD;  Location: [facility];  Service: Cardiovascular;  Laterality: N/A;  . CARDIAC CATHETERIZATION    .  CAROTID ENDARTERECTOMY    . COLON SURGERY    . CORONARY ARTERY BYPASS GRAFT  1994  . ENDARTERECTOMY Right 01/05/2013   Procedure: ENDARTERECTOMY CAROTID;  Surgeon: Rosetta Posner, MD;  Location: [facility];  Service: Vascular;  Laterality: Right;  . KNEE SURGERY  08/2003   left knee/ARTHROSCOPIC  . LEFT HEART CATHETERIZATION WITH CORONARY/GRAFT ANGIOGRAM N/A 09/01/2013   Procedure: LEFT HEART CATHETERIZATION WITH Beatrix Fetters;  Surgeon: Sinclair Grooms, MD;  Location: [facility] CATH LAB;  Service:  Cardiovascular;  Laterality: N/A;  . PATCH ANGIOPLASTY Right 01/05/2013   Procedure: PATCH ANGIOPLASTY;  Surgeon: Rosetta Posner, MD;  Location: [facility];  Service: Vascular;  Laterality: Right;  . PERCUTANEOUS CORONARY STENT INTERVENTION (PCI-S)  09/01/2013   Procedure: PERCUTANEOUS CORONARY STENT INTERVENTION (PCI-S);  Surgeon: Sinclair Grooms, MD;  Location: [facility] CATH LAB;  Service: Cardiovascular;;  . removal of bleeding ulcer  1994  . RENAL ARTERY STENT     stenting of the left renal artery as well as a cutting balloon angioplasty for treatment of in-stent restenosis coronary artery bypass grafting in 1994.  [name] RIGHT/LEFT HEART CATH AND CORONARY/GRAFT ANGIOGRAPHY N/A 08/14/2017   Procedure: RIGHT/LEFT HEART CATH AND CORONARY/GRAFT ANGIOGRAPHY;  Surgeon: Sherren Mocha, MD;  Location: [facility];  Service: Cardiovascular;  Laterality: N/A;  . TEE WITHOUT CARDIOVERSION N/A 09/17/2017   Procedure: TRANSESOPHAGEAL ECHOCARDIOGRAM (TEE);  Surgeon: Sherren Mocha, MD;  Location: [facility];  Service: Open Heart Surgery;  Laterality: N/A;        Home Medications    Prior to Admission medications   Medication Sig Start Date End Date Taking? Authorizing Provider  acetaminophen (TYLENOL) 500 MG tablet Take 1,000 mg by mouth every 6 (six) hours as needed for moderate pain or headache.    Yes [provider]  acyclovir (ZOVIRAX) 800 MG tablet Take 400 mg by mouth 2 (two) times daily.    Yes [provider]  albuterol (PROVENTIL,VENTOLIN) 90 MCG/ACT inhaler Inhale 2 puffs into the lungs every 4 (four) hours as needed for shortness of breath.    Yes [provider]  atorvastatin (LIPITOR) 80 MG tablet Take 1 tablet (80 mg total) by mouth at bedtime. 12/07/14  Yes Lelon Perla, MD  brimonidine (ALPHAGAN) 0.2 % ophthalmic solution Place 1 drop into both eyes 2 (two) times daily.   Yes [provider]  Cholecalciferol (VITAMIN D) 2000 UNITS CAPS Take 2,000 Units by mouth  daily.   Yes [provider]  CLARITIN 10 MG tablet Take 10 mg by mouth daily. 09/05/17  Yes [provider]  dabigatran (PRADAXA) 150 MG CAPS Take 150 mg by mouth 2 (two) times daily.    Yes [provider]  ferrous sulfate 325 (65 FE) MG tablet Take 325 mg by mouth daily with breakfast.    Yes [provider]  furosemide (LASIX) 20 MG tablet Take 2 tablets (40 mg total) by mouth daily. 08/14/17  Yes Sherren Mocha, MD  ipratropium-albuterol (DUONEB) 0.5-2.5 (3) MG/3ML SOLN Take 3 mLs by nebulization 3 (three) times daily.    Yes [provider]  lansoprazole (PREVACID) 30 MG capsule Take 30 mg by mouth daily.     Yes [provider]  metFORMIN (GLUCOPHAGE) 500 MG tablet Take 500 mg by mouth daily with breakfast.   Yes [provider]  metoprolol (LOPRESSOR) 50 MG tablet Take 1 tablet (50 mg total) by mouth 2 (two) times daily. 07/06/13  Yes Kirk Ruths  S, MD  Multiple Vitamin (MULTIVITAMIN WITH MINERALS) TABS Take 1 tablet by mouth daily.   Yes [provider]  multivitamin-lutein (OCUVITE-LUTEIN) CAPS Take 1 capsule by mouth daily.   Yes [provider]  predniSONE (DELTASONE) 20 MG tablet Take 20 mg by mouth daily as needed (for gout attack).    Yes [provider]  terazosin (HYTRIN) 5 MG capsule Take 5 mg by mouth at bedtime.    Yes [provider]  trolamine salicylate (ASPERCREME) 10 % cream Apply 1 application topically 4 (four) times daily as needed for muscle pain.    Yes [provider]  amoxicillin (AMOXIL) 500 MG tablet Take 4 tablets (2,000 mg total) by mouth as directed. Take one hour prior to dental visits. Patient not taking: Reported on 11/15/2018 09/09/18   Eileen Stanford, PA-C  benazepril (LOTENSIN) 20 MG tablet Take 10 mg by mouth daily. 09/01/17   [provider]    Family History Family History  Problem Relation Age of Onset  . Coronary artery disease  Mother   . Heart disease Mother        Before age 67  . Hyperlipidemia Mother   . Hypertension Mother   . Heart attack Mother   . Coronary artery disease Father   . Heart disease Father        After age 35  . Heart attack Father   . Heart disease Sister        After age 92  . Heart disease Brother        After age 23  . Coronary artery disease Other        13 sibling, almost all have coronary disease and some with premature onset  . Heart disease Other        13 sibling, almost all have coronary disease and some with premature onset    Social History Social History   Tobacco Use  . Smoking status: Former Smoker    Quit date: 04/27/1993    Years since quitting: 25.5  . Smokeless tobacco: Never Used  Substance Use Topics  . Alcohol use: No    Alcohol/week: 0.0 standard drinks  . Drug use: No     Allergies   Bactrim [sulfamethoxazole-trimethoprim] and Levaquin [levofloxacin in d5w]   Review of Systems Review of Systems  Constitutional: Positive for fever. Negative for appetite change.  HENT: Negative for congestion, rhinorrhea and sore throat.   Eyes: Negative for visual disturbance.  Respiratory: Negative for shortness of breath.   Cardiovascular: Negative for chest pain.  Gastrointestinal: Negative for abdominal pain, nausea and vomiting.  Genitourinary: Negative for difficulty urinating and dysuria.  Psychiatric/Behavioral: Negative for confusion.   Physical Exam Updated Vital Signs BP (!) 113/57   Pulse 80   Temp 99.9 F (37.7 C) (Oral)   Resp 18   Ht 6' (1.829 m)   Wt 90.7 kg   SpO2 97%   BMI 27.12 kg/m   Physical Exam Vitals signs reviewed.  Constitutional:      General: He is not in acute distress.    Appearance: Normal appearance. He is not ill-appearing, toxic-appearing or diaphoretic.  HENT:     Nose: Nose normal.     Mouth/Throat:     Mouth: Mucous membranes are moist.     Pharynx: Oropharynx is clear. No oropharyngeal exudate or  posterior oropharyngeal erythema.  Eyes:     Pupils: Pupils are equal, round, and reactive to light.  Neck:  Musculoskeletal: Normal range of motion. No muscular tenderness.  Cardiovascular:     Rate and Rhythm: Normal rate and regular rhythm.     Pulses: Normal pulses.     Heart sounds: Normal heart sounds. No murmur.  Pulmonary:     Effort: Pulmonary effort is normal. No respiratory distress.     Breath sounds: Normal breath sounds. No wheezing, rhonchi or rales.  Abdominal:     General: Bowel sounds are normal. There is no distension.     Palpations: Abdomen is soft.     Tenderness: There is no abdominal tenderness.  Musculoskeletal: Normal range of motion.     Right lower leg: No edema.     Left lower leg: No edema.  Lymphadenopathy:     Cervical: No cervical adenopathy.  Skin:    Comments: ~4cm skin tear to dorsum of L hand with surrounding erythema and warmth up to underarm, mild swelling. No fluctuance appreciated. 2+ radial pulses. Intact grip strength. No axillary lymphadenopathy.  Neurological:     Mental Status: He is alert and oriented to person, place, and time.  Psychiatric:        Mood and Affect: Mood normal.        Behavior: Behavior normal.    ED Treatments / Results  Labs (all labs ordered are listed, but only abnormal results are displayed) Labs Reviewed  COMPREHENSIVE METABOLIC PANEL - Abnormal; Notable for the following components:      Result Value   Sodium 134 (*)    Glucose, Bld 220 (*)    Total Protein 6.0 (*)    All other components within normal limits  CBC WITH DIFFERENTIAL/PLATELET - Abnormal; Notable for the following components:   WBC 12.1 (*)    Neutro Abs 10.3 (*)    All other components within normal limits  PROTIME-INR - Abnormal; Notable for the following components:   Prothrombin Time 15.8 (*)    INR 1.3 (*)    All other components within normal limits  CULTURE, BLOOD (ROUTINE X 2)  CULTURE, BLOOD (ROUTINE X 2)  SARS  CORONAVIRUS 2 (HOSPITAL ORDER, [facility])  LACTIC ACID, PLASMA  LACTIC ACID, PLASMA    EKG EKG Interpretation  Date/Time:  Saturday November 15 2018 19:56:31 EDT Ventricular Rate:  94 PR Interval:    QRS Duration: 164 QT Interval:  397 QTC Calculation: 497 R Axis:   -71 Text Interpretation:  Atrial fibrillation Left bundle branch block No significant change since last tracing Confirmed by Wandra Arthurs [phone]) on 11/15/2018 8:18:50 PM  Radiology Dg Forearm Left  Result Date: 11/15/2018 CLINICAL DATA:  Increased swelling in LEFT forearm, redness and fever after he hit his arm with a door, cellulitis EXAM: LEFT FOREARM - 2 VIEW COMPARISON:  None FINDINGS: Osseous demineralization. Wrist and elbow joint alignments normal. No fracture, dislocation, or bone destruction. Soft tissue swelling at the proximal forearm and elbow region without soft tissue gas. Scattered atherosclerotic calcifications. IMPRESSION: No acute osseous abnormalities. Electronically Signed   By: Lavonia Dana M.D.   On: 11/15/2018 21:08   Dg Hand Complete Left  Result Date: 11/15/2018 CLINICAL DATA:  Increased swelling in LEFT forearm, redness and fever after he hit his arm with a door, cellulitis EXAM: LEFT HAND - COMPLETE 3+ VIEW COMPARISON:  None FINDINGS: Osseous demineralization. Joint spaces preserved. No fracture, dislocation, or bone destruction. Scattered soft tissue swelling and atherosclerotic calcifications. IMPRESSION: No acute osseous abnormalities. Electronically Signed   By: Elta Guadeloupe  Thornton Papas M.D.   On: 11/15/2018 21:09    Procedures Procedures (including critical care time)  Medications Ordered in ED Medications  sodium chloride flush (NS) 0.9 % injection 3 mL (3 mLs Intravenous Not Given 11/15/18 2012)  ceFAZolin (ANCEF) IVPB 1 g/50 mL premix (1 g Intravenous New Bag/Given 11/15/18 2116)  morphine 2 MG/ML injection 2 mg (2 mg Intravenous Given 11/15/18 2044)  sodium chloride 0.9 % bolus  1,000 mL (1,000 mLs Intravenous New Bag/Given 11/15/18 2115)    Initial Impression / Assessment and Plan / ED Course  I have reviewed the triage vital signs and the nursing notes.  Pertinent labs & imaging results that were available during my care of the patient were reviewed by me and considered in my medical decision making (see chart for details).   85yo M w/ h/o of multiple vasculopathies, GERD, CAD s/p CABG, PUD, HTN, COPD, sleep apnea, DM, diastolic CHF here with L forearm pain, redness and fever after hitting arm on bathroom door 4 days ago, exam consistent with cellulitis. Vitals stable, afebrile here. Sepsis protocol initiated, obtained blood cultures x2, NS bolus given. Lactate normal with neutrophilic leukocytosis. L hand and forearm xrays without evidence of abscess or osteomyelitis. Ancef started in ED. Will admit for unassigned admission for IV antibiotics.     Final Clinical Impressions(s) / ED Diagnoses   Final diagnoses:  Cellulitis of left upper extremity    ED Discharge Orders    None       Rory Percy, DO 11/15/18 2127    Rory Percy, DO 11/15/18 2148    Drenda Freeze, MD 11/16/18 [phone]

## 2018-11-15 NOTE — ED Notes (Signed)
ED TO INPATIENT HANDOFF REPORT  ED Nurse Name and Phone #: [phone] S Name/Age/Gender Nada Libman 85 y.o. male Room/Bed: 030C/030C  Code Status   Code Status: Prior  Home/SNF/Other HOme AO x 4    Triage Complete: Triage complete  Chief Complaint CELLULITIS  Triage Note Pt brought to ED by PTAR from home for a complain of increase swollen on his left forearm, redness and fever after he hit his arm with a door. Pt c/o  9/10 pain at this time.   Allergies Allergies  Allergen Reactions  . Bactrim [Sulfamethoxazole-Trimethoprim] Other (See Comments)    "does not do anything for me"  . Levaquin [Levofloxacin In D5w] Diarrhea    Level of Care/Admitting Diagnosis ED Disposition    ED Disposition Condition [facility] Area: [facility] [[id number]]  Level of Care: Med-Surg [16]  Covid Evaluation: Screening Protocol (No Symptoms)  Diagnosis: Cellulitis [093818]  Admitting Physician: Bufford Lope [[id number]]  Attending Physician: Martyn Malay [[id number]]  Estimated length of stay: past midnight tomorrow  Certification:: I certify this patient will need inpatient services for at least 2 midnights  PT Class (Do Not Modify): Inpatient [101]  PT Acc Code (Do Not Modify): Private [1]       B Medical/Surgery History Past Medical History:  Diagnosis Date  . Acute myocardial infarction, unspecified site, initial episode of care   . Anemia   . Anxiety   . Aortic stenosis    . Atrial fibrillation ([place])    a. permanent, on Coumadin for anticoagulation  . CAD (coronary artery disease)    a. s/p CABG in 1994 b. cath in 2015 showing normal LM, 100% LCx and RCA stenosis with 50-60% prox LAD stenosis and 100% mid-LAD stenosis; patent sequential SVG-OM2-OM3 and patent LIMA-LAD with PTCA of distal LAD via LIMA graft performed at that time  . Carotid stenosis   . CHF (congestive heart failure) ([place])   . COPD (chronic obstructive pulmonary disease) ([place])    . Depression   . Diabetes mellitus without complication ([place])    FASTING 98-120S  . GERD (gastroesophageal reflux disease)   . History of blood transfusion   . History of peptic ulcer disease   . Hyperlipidemia   . Hypertension   . Myocardial infarction ([place])    X2  . Peripheral vascular disease ([place])   . Pneumonia    HX OF  . Renal artery stenosis ([place])   . Sleep apnea    OXYGEN AT NIGHT 2L [place]   Past Surgical History:  Procedure Laterality Date  . AORTIC ARCH ANGIOGRAPHY N/A 08/14/2017   Procedure: AORTIC ARCH ANGIOGRAPHY;  Surgeon: Sherren Mocha, MD;  Location: [facility];  Service: Cardiovascular;  Laterality: N/A;  . CARDIAC CATHETERIZATION    . CAROTID ENDARTERECTOMY    . COLON SURGERY    . CORONARY ARTERY BYPASS GRAFT  1994  . ENDARTERECTOMY Right 01/05/2013   Procedure: ENDARTERECTOMY CAROTID;  Surgeon: Rosetta Posner, MD;  Location: [facility];  Service: Vascular;  Laterality: Right;  . KNEE SURGERY  08/2003   left knee/ARTHROSCOPIC  . LEFT HEART CATHETERIZATION WITH CORONARY/GRAFT ANGIOGRAM N/A 09/01/2013   Procedure: LEFT HEART CATHETERIZATION WITH Beatrix Fetters;  Surgeon: Sinclair Grooms, MD;  Location: [facility] CATH LAB;  Service: Cardiovascular;  Laterality: N/A;  . PATCH ANGIOPLASTY Right 01/05/2013   Procedure: PATCH ANGIOPLASTY;  Surgeon: Rosetta Posner, MD;  Location: [facility];  Service: Vascular;  Laterality:  Right;  [name] PERCUTANEOUS CORONARY STENT INTERVENTION (PCI-S)  09/01/2013   Procedure: PERCUTANEOUS CORONARY STENT INTERVENTION (PCI-S);  Surgeon: Sinclair Grooms, MD;  Location: [facility] CATH LAB;  Service: Cardiovascular;;  . removal of bleeding ulcer  1994  . RENAL ARTERY STENT     stenting of the left renal artery as well as a cutting balloon angioplasty for treatment of in-stent restenosis coronary artery bypass grafting in 1994.  [name] RIGHT/LEFT HEART CATH AND CORONARY/GRAFT ANGIOGRAPHY N/A 08/14/2017   Procedure: RIGHT/LEFT HEART CATH AND CORONARY/GRAFT  ANGIOGRAPHY;  Surgeon: Sherren Mocha, MD;  Location: [facility];  Service: Cardiovascular;  Laterality: N/A;  . TEE WITHOUT CARDIOVERSION N/A 09/17/2017   Procedure: TRANSESOPHAGEAL ECHOCARDIOGRAM (TEE);  Surgeon: Sherren Mocha, MD;  Location: [facility];  Service: Open Heart Surgery;  Laterality: N/A;     A IV Location/Drains/Wounds Patient Lines/Drains/Airways Status   Active Line/Drains/Airways    Name:   Placement date:   Placement time:   Site:   Days:   Peripheral IV 11/15/18 Right Antecubital   11/15/18    2012    Antecubital   less than 1   Peripheral IV 11/15/18 Right Forearm   11/15/18    2013    Forearm   less than 1          Intake/Output Last 24 hours No intake or output data in the 24 hours ending 11/15/18 2154  Labs/Imaging Results for orders placed or performed during the hospital encounter of 11/15/18 (from the past 48 hour(s))  Comprehensive metabolic panel     Status: Abnormal   Collection Time: 11/15/18  8:00 PM  Result Value Ref Range   Sodium 134 (L) 135 - 145 mmol/L   Potassium 4.1 3.5 - 5.1 mmol/L   Chloride 100 98 - 111 mmol/L   CO2 25 22 - 32 mmol/L   Glucose, Bld 220 (H) 70 - 99 mg/dL   BUN 15 8 - 23 mg/dL   Creatinine, Ser 1.04 0.61 - 1.24 mg/dL   Calcium 8.9 8.9 - 10.3 mg/dL   Total Protein 6.0 (L) 6.5 - 8.1 g/dL   Albumin 3.6 3.5 - 5.0 g/dL   AST 23 15 - 41 U/L   ALT 28 0 - 44 U/L   Alkaline Phosphatase 101 38 - 126 U/L   Total Bilirubin 1.1 0.3 - 1.2 mg/dL   GFR calc non Af Amer >60 >60 mL/min   GFR calc Af Amer >60 >60 mL/min   Anion gap 9 5 - 15    Comment: Performed at [facility], [address], [place], [place] [address]  Lactic acid, plasma     Status: None   Collection Time: 11/15/18  8:00 PM  Result Value Ref Range   Lactic Acid, Venous 1.7 0.5 - 1.9 mmol/L    Comment: Performed at [facility] [address] [place], [place] [address]  CBC with Differential     Status: Abnormal   Collection Time: 11/15/18   8:00 PM  Result Value Ref Range   WBC 12.1 (H) 4.0 - 10.5 K/uL   RBC 4.36 4.22 - 5.81 MIL/uL   Hemoglobin 13.0 13.0 - 17.0 g/dL   HCT 39.6 39.0 - 52.0 %   MCV 90.8 80.0 - 100.0 fL   MCH 29.8 26.0 - 34.0 pg   MCHC 32.8 30.0 - 36.0 g/dL   RDW 14.7 11.5 - 15.5 %   Platelets 188 150 - 400 K/uL   nRBC  0.0 0.0 - 0.2 %   Neutrophils Relative % 85 %   Neutro Abs 10.3 (H) 1.7 - 7.7 K/uL   Lymphocytes Relative 7 %   Lymphs Abs 0.8 0.7 - 4.0 K/uL   Monocytes Relative 7 %   Monocytes Absolute 0.9 0.1 - 1.0 K/uL   Eosinophils Relative 1 %   Eosinophils Absolute 0.1 0.0 - 0.5 K/uL   Basophils Relative 0 %   Basophils Absolute 0.0 0.0 - 0.1 K/uL   Immature Granulocytes 0 %   Abs Immature Granulocytes 0.05 0.00 - 0.07 K/uL    Comment: Performed at [facility] [address], [place], [place] [address]  Protime-INR     Status: Abnormal   Collection Time: 11/15/18  8:00 PM  Result Value Ref Range   Prothrombin Time 15.8 (H) 11.4 - 15.2 seconds   INR 1.3 (H) 0.8 - 1.2    Comment: (NOTE) INR goal varies based on device and disease states. Performed at [facility], [place] [address], [place], [place] [address]    Dg Forearm Left  Result Date: 11/15/2018 CLINICAL DATA:  Increased swelling in LEFT forearm, redness and fever after he hit his arm with a door, cellulitis EXAM: LEFT FOREARM - 2 VIEW COMPARISON:  None FINDINGS: Osseous demineralization. Wrist and elbow joint alignments normal. No fracture, dislocation, or bone destruction. Soft tissue swelling at the proximal forearm and elbow region without soft tissue gas. Scattered atherosclerotic calcifications. IMPRESSION: No acute osseous abnormalities. Electronically Signed   By: Lavonia Dana M.D.   On: 11/15/2018 21:08   Dg Hand Complete Left  Result Date: 11/15/2018 CLINICAL DATA:  Increased swelling in LEFT forearm, redness and fever after he hit his arm with a door, cellulitis EXAM: LEFT HAND - COMPLETE 3+ VIEW COMPARISON:  None  FINDINGS: Osseous demineralization. Joint spaces preserved. No fracture, dislocation, or bone destruction. Scattered soft tissue swelling and atherosclerotic calcifications. IMPRESSION: No acute osseous abnormalities. Electronically Signed   By: Lavonia Dana M.D.   On: 11/15/2018 21:09    Pending Labs Unresulted Labs (From admission, onward)    Start     Ordered   11/15/18 2113  SARS Coronavirus 2  Once,   STAT     11/15/18 2113   11/15/18 2012  Lactic acid, plasma  Now then every 2 hours,   STAT     11/15/18 2011   11/15/18 2012  Culture, blood (Routine x 2)  BLOOD CULTURE X 2,   STAT     11/15/18 2011          Vitals/Pain Today's Vitals   11/15/18 2015 11/15/18 2030 11/15/18 2045 11/15/18 2126  BP: (!) 131/55 106/61 (!) 113/57   Pulse: 87 77 80   Resp:      Temp:      TempSrc:      SpO2: 96% 95% 97%   Weight:      Height:      PainSc:    0-No pain    Isolation Precautions No active isolations  Medications Medications  sodium chloride flush (NS) 0.9 % injection 3 mL (3 mLs Intravenous Not Given 11/15/18 2012)  morphine 2 MG/ML injection 2 mg (2 mg Intravenous Given 11/15/18 2044)  sodium chloride 0.9 % bolus 1,000 mL (1,000 mLs Intravenous New Bag/Given 11/15/18 2115)  ceFAZolin (ANCEF) IVPB 1 g/50 mL premix (1 g Intravenous New Bag/Given 11/15/18 2116)    Mobility One assist  Moderate fall risk   Focused Assessments Redness  and swollen on left forearm for the past 4 days getting worse today, warm to touch, small skin tear on the left hand, multiple bruises on right hand.   R Recommendations: See Admitting Provider Note  Report given to:   Additional Notes:

## 2018-11-15 NOTE — ED Triage Notes (Signed)
Pt brought to ED by PTAR from home for a complain of increase swollen on his left forearm, redness and fever after he hit his arm with a door. Pt c/o  9/10 pain at this time.

## 2018-11-15 NOTE — H&P (Signed)
[facility] Admission History and Physical Service Pager: [phone]  Patient name: [name] Medical record number: [id number] Date of birth: 08/29/1931 Age: 85 y.o. Gender: male  Primary Care Provider: Nicoletta Dress, MD Consultants: none Code Status: Full Preferred Emergency Contact: wife [name] [phone]  Chief Complaint: L arm redness  Assessment and Plan: [name] is a 85 y.o. male presenting with L arm redness. PMH is significant for DM, Afib on pradaxa, aortic stenosis s/p TAVR, PVD, CAD s/p CABG, HTN, COPD, HLD, HFpEF, GERD.  Cellulitis.  Patient presents with right forearm erythema and warmth after an abrasion.  Patient was febrile earlier today but is stable and afebrile on admission.  Did not meet sepsis.  No purulence.  No lactic acidosis.  Leukocytosis with white blood cell count of 12.1.  Reassuringly he has good right radial pulse and good capillary refill of his right fingers - Admit to inpatient, attending Dr. Owens Shark -Continue Ancef (6/13-) -Follow-up blood cultures -Monitor for fever -Trend white count  A Fib.  Stable rate controlled with heart rate in the 70s. -Continue home Pradaxa 150 mg twice daily -Continue home metoprolol 50 mg twice daily  Diabetes.  Last A1c viewable is 6.1 on 09/13/2017.  Patient glucose on admission 220 -Continue home metformin 500 mg daily -Monitor CBGs in the setting of acute infection  PVD, CAD s/p CABG, HLD -Continue home atorvastatin   GERD -Pantoprazole 40 mg daily  HFpEF, HTN  Stable -Continue home Lasix 40 mg daily -Continue home terazosin 5 mg daily -Continue home metoprolol as per above   FEN/GI: Heart healthy carb modified Prophylaxis: Pradaxa   Disposition: Admit to MedSurg  History of Present Illness:  [name] is a 85 y.o. male presenting with left arm redness.  Patient states that he was at home 4 days ago and while walking into his bathroom he hit his  right forearm on the side of the door which caused an abrasion.  He states that he has been trying to keep it clean at home with warm water and soap and alcohol.  He has been trying to dress it with over-the-counter antibiotic ointment.  He noticed that it is increasingly gotten more painful and the redness started going up his arm.  He states that the arm is also felt warm to touch.  He has not had any decreased sensation in his fingers or weakness.  He states that he had a temperature this afternoon to 101.11F.  He also had some shaking and chills.  His wife is an Therapist, sports and told him to go see his doctor today.  His PCP in [place] Dr. Delena Bali reportedly gave him an antibiotic injection and a Tdap before sending him to the ED for evaluation.  Review Of Systems: Per HPI with the following additions:   Review of Systems  Constitutional: Positive for chills and fever.  Respiratory: Negative for cough, shortness of breath and wheezing.   Cardiovascular: Negative for chest pain and palpitations.  Gastrointestinal: Negative for abdominal pain, diarrhea, nausea and vomiting.  Skin: Positive for rash.  Neurological: Negative for tingling and weakness.     Patient Active Problem List   Diagnosis Date Noted  . 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 Severe aortic stenosis 09/17/2017  . Mitral stenosis   . Aftercare following surgery of the circulatory system 03/15/2014  . Carotid stenosis 03/15/2014  . Unstable angina ([place]) 08/31/2013  . Acute myocardial infarction, unspecified site, initial  episode of care   . Aortic stenosis   . Atrial fibrillation ([place])   . Vertigo 06/27/2013  . Chest pain with moderate risk of acute coronary syndrome 06/27/2013  . CAD - CABG '94, low risk Myoview May 2014 06/27/2013  . Chronic atrial fibrillation 06/27/2013  . Chronic anticoagulation 06/27/2013  . Gout attack- on steroid dose pack 06/27/2013  . Tachycardia- beta blocker increased  06/26/2013  . Essential hypertension, benign 03/10/2009  . HYPERLIPIDEMIA 09/09/2008  . UNSPECIFIED ANEMIA 09/09/2008  . CAROTID STENOSIS- moderate 09/09/2008  . UNSPECIFIED CEREBROVASCULAR DISEASE 09/09/2008  . RENAL ARTERY STENOSIS- moderate 09/09/2008  . PERIPHERAL VASCULAR DISEASE 09/09/2008  . PEPTIC ULCER DISEASE, HX OF 09/09/2008    Past Medical History: Past Medical History:  Diagnosis Date  . Acute myocardial infarction, unspecified site, initial episode of care   . Anemia   . Anxiety   . Aortic stenosis    . Atrial fibrillation ([place])    a. permanent, on Coumadin for anticoagulation  . CAD (coronary artery disease)    a. s/p CABG in 1994 b. cath in 2015 showing normal LM, 100% LCx and RCA stenosis with 50-60% prox LAD stenosis and 100% mid-LAD stenosis; patent sequential SVG-OM2-OM3 and patent LIMA-LAD with PTCA of distal LAD via LIMA graft performed at that time  . Carotid stenosis   . CHF (congestive heart failure) ([place])   . COPD (chronic obstructive pulmonary disease) ([place])   . Depression   . Diabetes mellitus without complication ([place])    FASTING 98-120S  . GERD (gastroesophageal reflux disease)   . History of blood transfusion   . History of peptic ulcer disease   . Hyperlipidemia   . Hypertension   . Myocardial infarction ([place])    X2  . Peripheral vascular disease ([place])   . Pneumonia    HX OF  . Renal artery stenosis ([place])   . Sleep apnea    OXYGEN AT NIGHT 2L [place]    Past Surgical History: Past Surgical History:  Procedure Laterality Date  . AORTIC ARCH ANGIOGRAPHY N/A 08/14/2017   Procedure: AORTIC ARCH ANGIOGRAPHY;  Surgeon: Sherren Mocha, MD;  Location: [facility];  Service: Cardiovascular;  Laterality: N/A;  . CARDIAC CATHETERIZATION    . CAROTID ENDARTERECTOMY    . COLON SURGERY    . CORONARY ARTERY BYPASS GRAFT  1994  . ENDARTERECTOMY Right 01/05/2013   Procedure: ENDARTERECTOMY CAROTID;  Surgeon: Rosetta Posner, MD;  Location: [facility];   Service: Vascular;  Laterality: Right;  . KNEE SURGERY  08/2003   left knee/ARTHROSCOPIC  . LEFT HEART CATHETERIZATION WITH CORONARY/GRAFT ANGIOGRAM N/A 09/01/2013   Procedure: LEFT HEART CATHETERIZATION WITH Beatrix Fetters;  Surgeon: Sinclair Grooms, MD;  Location: [facility];  Service: Cardiovascular;  Laterality: N/A;  . PATCH ANGIOPLASTY Right 01/05/2013   Procedure: PATCH ANGIOPLASTY;  Surgeon: Rosetta Posner, MD;  Location: [facility];  Service: Vascular;  Laterality: Right;  . PERCUTANEOUS CORONARY STENT INTERVENTION (PCI-S)  09/01/2013   Procedure: PERCUTANEOUS CORONARY STENT INTERVENTION (PCI-S);  Surgeon: Sinclair Grooms, MD;  Location: [facility] C[facility] LAB;  Service: Cardiovascular;;  . removal of bleeding ulcer  1994  . RENAL ARTERY STENT     stenting of the left renal artery as well as a cutting balloon angioplasty for treatment of in-stent restenosis coronary artery bypass grafting in 1994.  [name] RIGHT/LEFT HEART CATH AND CORONARY/GRAFT ANGIOGRAPHY N/A 08/14/2017   Procedure: RIGHT/LEFT HEART CATH AND CORONARY/GRAFT ANGIOGRAPHY;  Surgeon: Burt Knack,  Legrand Como, MD;  Location: [facility];  Service: Cardiovascular;  Laterality: N/A;  . TEE WITHOUT CARDIOVERSION N/A 09/17/2017   Procedure: TRANSESOPHAGEAL ECHOCARDIOGRAM (TEE);  Surgeon: Sherren Mocha, MD;  Location: [facility];  Service: Open Heart Surgery;  Laterality: N/A;    Social History: Social History   Tobacco Use  . Smoking status: Former Smoker    Quit date: 04/27/1993    Years since quitting: 25.5  . Smokeless tobacco: Never Used  Substance Use Topics  . Alcohol use: No    Alcohol/week: 0.0 standard drinks  . Drug use: No   Additional social history: Lives at home with wife.  Please also refer to relevant sections of EMR.  Family History: Family History  Problem Relation Age of Onset  . Coronary artery disease Mother   . Heart disease Mother        Before age 21  . Hyperlipidemia Mother   . Hypertension Mother   .  Heart attack Mother   . Coronary artery disease Father   . Heart disease Father        After age 24  . Heart attack Father   . Heart disease Sister        After age 75  . Heart disease Brother        After age 53  . Coronary artery disease Other        13 sibling, almost all have coronary disease and some with premature onset  . Heart disease Other        13 sibling, almost all have coronary disease and some with premature onset    Allergies and Medications: Allergies  Allergen Reactions  . Bactrim [Sulfamethoxazole-Trimethoprim] Other (See Comments)    "does not do anything for me"  . Levaquin [Levofloxacin In D5w] Diarrhea   No current facility-administered medications on file prior to encounter.    Current Outpatient Medications on File Prior to Encounter  Medication Sig Dispense Refill  . acetaminophen (TYLENOL) 500 MG tablet Take 1,000 mg by mouth every 6 (six) hours as needed for moderate pain or headache.     [name] acyclovir (ZOVIRAX) 800 MG tablet Take 400 mg by mouth 2 (two) times daily.     [name] albuterol (PROVENTIL,VENTOLIN) 90 MCG/ACT inhaler Inhale 2 puffs into the lungs every 4 (four) hours as needed for shortness of breath.     [name] atorvastatin (LIPITOR) 80 MG tablet Take 1 tablet (80 mg total) by mouth at bedtime. 90 tablet 3  . brimonidine (ALPHAGAN) 0.2 % ophthalmic solution Place 1 drop into both eyes 2 (two) times daily.    . Cholecalciferol (VITAMIN D) 2000 UNITS CAPS Take 2,000 Units by mouth daily.    [name] CLARITIN 10 MG tablet Take 10 mg by mouth daily.    . dabigatran (PRADAXA) 150 MG CAPS Take 150 mg by mouth 2 (two) times daily.     . ferrous sulfate 325 (65 FE) MG tablet Take 325 mg by mouth daily with breakfast.     . furosemide (LASIX) 20 MG tablet Take 2 tablets (40 mg total) by mouth daily. 30 tablet 5  . ipratropium-albuterol (DUONEB) 0.5-2.5 (3) MG/3ML SOLN Take 3 mLs by nebulization 3 (three) times daily.     . lansoprazole (PREVACID) 30 MG capsule Take 30  mg by mouth daily.      . metFORMIN (GLUCOPHAGE) 500 MG tablet Take 500 mg by mouth daily with breakfast.    . metoprolol (LOPRESSOR) 50 MG tablet Take 1  tablet (50 mg total) by mouth 2 (two) times daily. 180 tablet 4  . Multiple Vitamin (MULTIVITAMIN WITH MINERALS) TABS Take 1 tablet by mouth daily.    . multivitamin-lutein (OCUVITE-LUTEIN) CAPS Take 1 capsule by mouth daily.    . predniSONE (DELTASONE) 20 MG tablet Take 20 mg by mouth daily as needed (for gout attack).     . terazosin (HYTRIN) 5 MG capsule Take 5 mg by mouth at bedtime.     . trolamine salicylate (ASPERCREME) 10 % cream Apply 1 application topically 4 (four) times daily as needed for muscle pain.     [name] amoxicillin (AMOXIL) 500 MG tablet Take 4 tablets (2,000 mg total) by mouth as directed. Take one hour prior to dental visits. (Patient not taking: Reported on 11/15/2018) 12 tablet 12  . benazepril (LOTENSIN) 20 MG tablet Take 10 mg by mouth daily.      Objective: BP (!) 113/57   Pulse 80   Temp 99.9 F (37.7 C) (Oral)   Resp 18   Ht 6' (1.829 m)   Wt 90.7 kg   SpO2 97%   BMI 27.12 kg/m  Exam: General: Sitting up in bed, well-appearing, in no acute distress  Eyes: pupils equal and round ENTM: Nose normal Neck: Supple and midline Cardiovascular: Irregularly irregular with a soft murmur.  2+ right radial pulse.  Right fingers with <3-second cap refill Respiratory: Normal work of breathing, clear to auscultation bilaterally Gastrointestinal: Soft, nontender, nondistended, positive bowel sounds MSK: 1+ pitting edema to mid shins bilaterally. Derm: Left forearm with shallow abrasion at wrist with surrounding erythema extending from mid hand up to just above elbow, warm to touch, 1+ pitting edema, painful to palpation.  See picture below for more details. Neuro: Alert, oriented, no focal deficits Psych: Appropriate affect      Labs and Imaging: CBC BMET  Recent Labs  Lab 11/15/18 2000  WBC 12.1*  HGB 13.0   HCT 39.6  PLT 188   Recent Labs  Lab 11/15/18 2000  NA 134*  K 4.1  CL 100  CO2 25  BUN 15  CREATININE 1.04  GLUCOSE 220*  CALCIUM 8.9     Lactic Acid, Venous    Component Value Date/Time   LATICACIDVEN 1.7 11/15/2018 2000     Dg Forearm Left  Result Date: 11/15/2018 CLINICAL DATA:  Increased swelling in LEFT forearm, redness and fever after he hit his arm with a door, cellulitis EXAM: LEFT FOREARM - 2 VIEW COMPARISON:  None FINDINGS: Osseous demineralization. Wrist and elbow joint alignments normal. No fracture, dislocation, or bone destruction. Soft tissue swelling at the proximal forearm and elbow region without soft tissue gas. Scattered atherosclerotic calcifications. IMPRESSION: No acute osseous abnormalities. Electronically Signed   By: Lavonia Dana M.D.   On: 11/15/2018 21:08   Dg Hand Complete Left  Result Date: 11/15/2018 CLINICAL DATA:  Increased swelling in LEFT forearm, redness and fever after he hit his arm with a door, cellulitis EXAM: LEFT HAND - COMPLETE 3+ VIEW COMPARISON:  None FINDINGS: Osseous demineralization. Joint spaces preserved. No fracture, dislocation, or bone destruction. Scattered soft tissue swelling and atherosclerotic calcifications. IMPRESSION: No acute osseous abnormalities. Electronically Signed   By: Lavonia Dana M.D.   On: 11/15/2018 21:09     Bufford Lope, DO 11/15/2018, 9:40 PM PGY-3, [place] Intern pager: [phone], text pages welcome

## 2018-11-15 NOTE — ED Notes (Signed)
[name] [phone]; updated.

## 2018-11-15 NOTE — ED Notes (Signed)
Attempted to call floor if chart was reviewed. Unable to answer at this time.

## 2018-11-16 ENCOUNTER — Inpatient Hospital Stay (HOSPITAL_COMMUNITY): Payer: Medicare Other | Admitting: Anesthesiology

## 2018-11-16 ENCOUNTER — Encounter (HOSPITAL_COMMUNITY)
Admission: EM | Disposition: A | Discharge status: Home Health | Payer: Self-pay | Source: Home / Self Care | Attending: Family Medicine

## 2018-11-16 ENCOUNTER — Encounter (HOSPITAL_COMMUNITY): Payer: Self-pay | Admitting: Anesthesiology

## 2018-11-16 DIAGNOSIS — I2510 Atherosclerotic heart disease of native coronary artery without angina pectoris: Secondary | ICD-10-CM

## 2018-11-16 DIAGNOSIS — I252 Old myocardial infarction: Secondary | ICD-10-CM

## 2018-11-16 DIAGNOSIS — I447 Left bundle-branch block, unspecified: Secondary | ICD-10-CM

## 2018-11-16 DIAGNOSIS — Z952 Presence of prosthetic heart valve: Secondary | ICD-10-CM

## 2018-11-16 DIAGNOSIS — Z7901 Long term (current) use of anticoagulants: Secondary | ICD-10-CM

## 2018-11-16 DIAGNOSIS — L03114 Cellulitis of left upper limb: Principal | ICD-10-CM

## 2018-11-16 DIAGNOSIS — M60032 Infective myositis, left forearm: Secondary | ICD-10-CM | POA: Diagnosis not present

## 2018-11-16 HISTORY — PX: INCISION AND DRAINAGE: SHX5863

## 2018-11-16 LAB — CBC
HCT: 36.2 % — ABNORMAL LOW (ref 39.0–52.0)
Hemoglobin: 11.7 g/dL — ABNORMAL LOW (ref 13.0–17.0)
MCH: 29.2 pg (ref 26.0–34.0)
MCHC: 32.3 g/dL (ref 30.0–36.0)
MCV: 90.3 fL (ref 80.0–100.0)
Platelets: 150 10*3/uL (ref 150–400)
RBC: 4.01 MIL/uL — ABNORMAL LOW (ref 4.22–5.81)
RDW: 14.8 % (ref 11.5–15.5)
WBC: 9.8 10*3/uL (ref 4.0–10.5)
nRBC: 0 % (ref 0.0–0.2)

## 2018-11-16 LAB — LACTIC ACID, PLASMA: Lactic Acid, Venous: 1.3 mmol/L (ref 0.5–1.9)

## 2018-11-16 LAB — BASIC METABOLIC PANEL
Anion gap: 9 (ref 5–15)
BUN: 12 mg/dL (ref 8–23)
CO2: 26 mmol/L (ref 22–32)
Calcium: 8.5 mg/dL — ABNORMAL LOW (ref 8.9–10.3)
Chloride: 102 mmol/L (ref 98–111)
Creatinine, Ser: 0.9 mg/dL (ref 0.61–1.24)
GFR calc Af Amer: >60 mL/min (ref >60)
GFR calc non Af Amer: >60 mL/min (ref >60)
Glucose, Bld: 135 mg/dL — ABNORMAL HIGH (ref 70–99)
Potassium: 3.9 mmol/L (ref 3.5–5.1)
Sodium: 137 mmol/L (ref 135–145)

## 2018-11-16 LAB — SURGICAL PCR SCREEN
MRSA, PCR: NEGATIVE
Staphylococcus aureus: NEGATIVE

## 2018-11-16 LAB — GLUCOSE, CAPILLARY
Glucose-Capillary: 142 mg/dL — ABNORMAL HIGH (ref 70–99)
Glucose-Capillary: 127 mg/dL — ABNORMAL HIGH (ref 70–99)
Glucose-Capillary: 129 mg/dL — ABNORMAL HIGH (ref 70–99)
Glucose-Capillary: 117 mg/dL — ABNORMAL HIGH (ref 70–99)
Glucose-Capillary: 115 mg/dL — ABNORMAL HIGH (ref 70–99)
Glucose-Capillary: 166 mg/dL — ABNORMAL HIGH (ref 70–99)

## 2018-11-16 SURGERY — INCISION AND DRAINAGE
Anesthesia: General | Site: Arm Lower | Laterality: Left

## 2018-11-16 MED ORDER — METFORMIN HCL 500 MG PO TABS
500.0000 mg | ORAL_TABLET | Freq: Every day | ORAL | Status: DC
  Administered 2018-11-16: 500 mg via ORAL
  Filled 2018-11-16: qty 1

## 2018-11-16 MED ORDER — MUSCLE RUB 10-15 % EX CREA
1.0000 "application " | TOPICAL_CREAM | Freq: Four times a day (QID) | CUTANEOUS | Status: DC | PRN
  Administered 2018-11-19: 1 via TOPICAL
  Filled 2018-11-16: qty 85

## 2018-11-16 MED ORDER — HYDROCODONE-ACETAMINOPHEN 5-325 MG PO TABS
1.0000 | ORAL_TABLET | ORAL | Status: DC | PRN
  Administered 2018-11-16 – 2018-11-18 (×4): 2 via ORAL
  Filled 2018-11-16 (×4): qty 2

## 2018-11-16 MED ORDER — FENTANYL CITRATE (PF) 250 MCG/5ML IJ SOLN
INTRAMUSCULAR | Status: AC
  Filled 2018-11-16: qty 5

## 2018-11-16 MED ORDER — ONDANSETRON HCL 4 MG/2ML IJ SOLN
INTRAMUSCULAR | Status: DC | PRN
  Administered 2018-11-16: 4 mg via INTRAVENOUS

## 2018-11-16 MED ORDER — ACETAMINOPHEN 325 MG PO TABS: 650.0000 mg | ORAL_TABLET | Freq: Four times a day (QID) | ORAL | Status: DC | PRN

## 2018-11-16 MED ORDER — VITAMIN D 25 MCG (1000 UNIT) PO TABS
2000.0000 [IU] | ORAL_TABLET | Freq: Every day | ORAL | Status: DC
  Administered 2018-11-16 – 2018-11-26 (×11): 2000 [IU] via ORAL
  Filled 2018-11-16 (×12): qty 2

## 2018-11-16 MED ORDER — ONDANSETRON HCL 4 MG/2ML IJ SOLN
INTRAMUSCULAR | Status: AC
  Filled 2018-11-16: qty 2

## 2018-11-16 MED ORDER — BUPIVACAINE HCL (PF) 0.25 % IJ SOLN
INTRAMUSCULAR | Status: DC | PRN
  Administered 2018-11-16: 20 mL

## 2018-11-16 MED ORDER — EPHEDRINE SULFATE-NACL 50-0.9 MG/10ML-% IV SOSY
PREFILLED_SYRINGE | INTRAVENOUS | Status: DC | PRN
  Administered 2018-11-16: 5 mg via INTRAVENOUS

## 2018-11-16 MED ORDER — VITAMIN C 500 MG PO TABS
1000.0000 mg | ORAL_TABLET | Freq: Every day | ORAL | Status: DC
  Administered 2018-11-16 – 2018-11-26 (×11): 1000 mg via ORAL
  Filled 2018-11-16 (×10): qty 2

## 2018-11-16 MED ORDER — BUPIVACAINE HCL (PF) 0.25 % IJ SOLN
INTRAMUSCULAR | Status: AC
  Filled 2018-11-16: qty 30

## 2018-11-16 MED ORDER — FENTANYL CITRATE (PF) 100 MCG/2ML IJ SOLN
25.0000 ug | INTRAMUSCULAR | Status: DC | PRN
  Administered 2018-11-16 (×2): 50 ug via INTRAVENOUS

## 2018-11-16 MED ORDER — FUROSEMIDE 40 MG PO TABS
40.0000 mg | ORAL_TABLET | Freq: Every day | ORAL | Status: DC
  Administered 2018-11-16: 40 mg via ORAL
  Filled 2018-11-16: qty 1

## 2018-11-16 MED ORDER — FERROUS SULFATE 325 (65 FE) MG PO TABS
325.0000 mg | ORAL_TABLET | Freq: Every day | ORAL | Status: DC
  Administered 2018-11-16 – 2018-11-26 (×11): 325 mg via ORAL
  Filled 2018-11-16 (×11): qty 1

## 2018-11-16 MED ORDER — LIDOCAINE HCL (CARDIAC) PF 100 MG/5ML IV SOSY
PREFILLED_SYRINGE | INTRAVENOUS | Status: DC | PRN
  Administered 2018-11-16: 60 mg via INTRAVENOUS

## 2018-11-16 MED ORDER — LACTATED RINGERS IV SOLN
INTRAVENOUS | Status: DC
  Administered 2018-11-16: 20:00:00 via INTRAVENOUS

## 2018-11-16 MED ORDER — FENTANYL CITRATE (PF) 100 MCG/2ML IJ SOLN
INTRAMUSCULAR | Status: DC | PRN
  Administered 2018-11-16 (×4): 25 ug via INTRAVENOUS

## 2018-11-16 MED ORDER — DABIGATRAN ETEXILATE MESYLATE 150 MG PO CAPS
150.0000 mg | ORAL_CAPSULE | Freq: Two times a day (BID) | ORAL | Status: DC
  Administered 2018-11-16: 150 mg via ORAL
  Filled 2018-11-16 (×3): qty 1

## 2018-11-16 MED ORDER — ATORVASTATIN CALCIUM 80 MG PO TABS
80.0000 mg | ORAL_TABLET | Freq: Every day | ORAL | Status: DC
  Administered 2018-11-16 – 2018-11-25 (×10): 80 mg via ORAL
  Filled 2018-11-16 (×10): qty 1

## 2018-11-16 MED ORDER — ALBUTEROL SULFATE (2.5 MG/3ML) 0.083% IN NEBU
3.0000 mL | INHALATION_SOLUTION | RESPIRATORY_TRACT | Status: DC | PRN
  Administered 2018-11-16 – 2018-11-24 (×12): 3 mL via RESPIRATORY_TRACT
  Filled 2018-11-16 (×12): qty 3

## 2018-11-16 MED ORDER — PROPOFOL 10 MG/ML IV BOLUS
INTRAVENOUS | Status: AC
  Filled 2018-11-16: qty 20

## 2018-11-16 MED ORDER — SODIUM CHLORIDE 0.9 % IV SOLN
3.0000 g | Freq: Four times a day (QID) | INTRAVENOUS | Status: DC
  Administered 2018-11-16 – 2018-11-20 (×15): 3 g via INTRAVENOUS
  Filled 2018-11-16 (×17): qty 3

## 2018-11-16 MED ORDER — PROPOFOL 10 MG/ML IV BOLUS
INTRAVENOUS | Status: DC | PRN
  Administered 2018-11-16: 110 mg via INTRAVENOUS

## 2018-11-16 MED ORDER — FENTANYL CITRATE (PF) 100 MCG/2ML IJ SOLN
INTRAMUSCULAR | Status: AC
  Filled 2018-11-16: qty 2

## 2018-11-16 MED ORDER — DEXAMETHASONE SODIUM PHOSPHATE 10 MG/ML IJ SOLN
INTRAMUSCULAR | Status: AC
  Filled 2018-11-16: qty 1

## 2018-11-16 MED ORDER — BRIMONIDINE TARTRATE 0.2 % OP SOLN
1.0000 [drp] | Freq: Two times a day (BID) | OPHTHALMIC | Status: DC
  Administered 2018-11-16 – 2018-11-26 (×21): 1 [drp] via OPHTHALMIC
  Filled 2018-11-16: qty 5

## 2018-11-16 MED ORDER — METOPROLOL TARTRATE 50 MG PO TABS
50.0000 mg | ORAL_TABLET | Freq: Two times a day (BID) | ORAL | Status: DC
  Administered 2018-11-16 – 2018-11-23 (×15): 50 mg via ORAL
  Filled 2018-11-16 (×15): qty 1

## 2018-11-16 MED ORDER — VANCOMYCIN HCL 10 G IV SOLR
1750.0000 mg | INTRAVENOUS | Status: DC
  Administered 2018-11-16 – 2018-11-18 (×3): 1750 mg via INTRAVENOUS
  Filled 2018-11-16 (×3): qty 1750

## 2018-11-16 MED ORDER — 0.9 % SODIUM CHLORIDE (POUR BTL) OPTIME
TOPICAL | Status: DC | PRN
  Administered 2018-11-16: 1000 mL

## 2018-11-16 MED ORDER — BENAZEPRIL HCL 5 MG PO TABS: 10.0000 mg | ORAL_TABLET | Freq: Every day | ORAL | Status: DC

## 2018-11-16 MED ORDER — CALCIUM CARBONATE ANTACID 500 MG PO CHEW
1.0000 | CHEWABLE_TABLET | Freq: Three times a day (TID) | ORAL | Status: DC
  Administered 2018-11-16 – 2018-11-23 (×22): 200 mg via ORAL
  Filled 2018-11-16 (×22): qty 1

## 2018-11-16 MED ORDER — PANTOPRAZOLE SODIUM 40 MG PO TBEC
40.0000 mg | DELAYED_RELEASE_TABLET | Freq: Every day | ORAL | Status: DC
  Administered 2018-11-16 – 2018-11-26 (×11): 40 mg via ORAL
  Filled 2018-11-16 (×11): qty 1

## 2018-11-16 MED ORDER — TERAZOSIN HCL 5 MG PO CAPS
5.0000 mg | ORAL_CAPSULE | Freq: Every day | ORAL | Status: DC
  Administered 2018-11-16 – 2018-11-25 (×10): 5 mg via ORAL
  Filled 2018-11-16 (×15): qty 1

## 2018-11-16 MED ORDER — LORATADINE 10 MG PO TABS
10.0000 mg | ORAL_TABLET | Freq: Every day | ORAL | Status: DC
  Administered 2018-11-16 – 2018-11-26 (×11): 10 mg via ORAL
  Filled 2018-11-16 (×11): qty 1

## 2018-11-16 MED ORDER — PHENYLEPHRINE 40 MCG/ML (10ML) SYRINGE FOR IV PUSH (FOR BLOOD PRESSURE SUPPORT)
PREFILLED_SYRINGE | INTRAVENOUS | Status: DC | PRN
  Administered 2018-11-16: 40 ug via INTRAVENOUS
  Administered 2018-11-16: 80 ug via INTRAVENOUS
  Administered 2018-11-16 (×2): 40 ug via INTRAVENOUS
  Administered 2018-11-16 (×2): 80 ug via INTRAVENOUS
  Administered 2018-11-16: 40 ug via INTRAVENOUS

## 2018-11-16 MED ORDER — CEFAZOLIN SODIUM-DEXTROSE 1-4 GM/50ML-% IV SOLN
1.0000 g | Freq: Three times a day (TID) | INTRAVENOUS | Status: DC
  Administered 2018-11-16: 1 g via INTRAVENOUS
  Filled 2018-11-16 (×2): qty 50

## 2018-11-16 MED ORDER — DEXAMETHASONE SODIUM PHOSPHATE 10 MG/ML IJ SOLN
INTRAMUSCULAR | Status: DC | PRN
  Administered 2018-11-16: 5 mg via INTRAVENOUS

## 2018-11-16 MED ORDER — SODIUM CHLORIDE 0.9 % IR SOLN
Status: DC | PRN
  Administered 2018-11-16: 3000 mL

## 2018-11-16 MED ORDER — LACTATED RINGERS IV SOLN
INTRAVENOUS | Status: DC | PRN
  Administered 2018-11-16: 18:00:00 via INTRAVENOUS

## 2018-11-16 SURGICAL SUPPLY — 58 items
BANDAGE ACE 3X5.8 VEL STRL LF (GAUZE/BANDAGES/DRESSINGS) ×3 IMPLANT
BANDAGE ACE 4X5 VEL STRL LF (GAUZE/BANDAGES/DRESSINGS) ×3 IMPLANT
BANDAGE COBAN STERILE 2 (GAUZE/BANDAGES/DRESSINGS) IMPLANT
BNDG CMPR 9X4 STRL LF SNTH (GAUZE/BANDAGES/DRESSINGS) ×1
BNDG ESMARK 4X9 LF (GAUZE/BANDAGES/DRESSINGS) ×2 IMPLANT
BNDG GAUZE ELAST 4 BULKY (GAUZE/BANDAGES/DRESSINGS) ×3 IMPLANT
CORDS BIPOLAR (ELECTRODE) ×3 IMPLANT
COVER SURGICAL LIGHT HANDLE (MISCELLANEOUS) ×3 IMPLANT
COVER WAND RF STERILE (DRAPES) ×1 IMPLANT
CUFF TOURNIQUET SINGLE 18IN (TOURNIQUET CUFF) ×2 IMPLANT
CUFF TOURNIQUET SINGLE 24IN (TOURNIQUET CUFF) IMPLANT
DECANTER SPIKE VIAL GLASS SM (MISCELLANEOUS) ×3 IMPLANT
DRAIN PENROSE 1/4X12 LTX STRL (WOUND CARE) IMPLANT
DRSG PAD ABDOMINAL 8X10 ST (GAUZE/BANDAGES/DRESSINGS) ×4 IMPLANT
GAUZE PACKING IODOFORM 1/2 (PACKING) ×2 IMPLANT
GAUZE SPONGE 4X4 12PLY STRL (GAUZE/BANDAGES/DRESSINGS) ×3 IMPLANT
GAUZE SPONGE 4X4 12PLY STRL LF (GAUZE/BANDAGES/DRESSINGS) ×2 IMPLANT
GAUZE SPONGE 4X4 16PLY XRAY LF (GAUZE/BANDAGES/DRESSINGS) ×2 IMPLANT
GAUZE XEROFORM 1X8 LF (GAUZE/BANDAGES/DRESSINGS) ×1 IMPLANT
GAUZE XEROFORM 5X9 LF (GAUZE/BANDAGES/DRESSINGS) ×2 IMPLANT
GLOVE BIO SURGEON STRL SZ7.5 (GLOVE) ×3 IMPLANT
GLOVE BIOGEL PI IND STRL 8 (GLOVE) ×1 IMPLANT
GLOVE BIOGEL PI INDICATOR 8 (GLOVE) ×2
GOWN STRL REUS W/ TWL LRG LVL3 (GOWN DISPOSABLE) ×1 IMPLANT
GOWN STRL REUS W/ TWL XL LVL3 (GOWN DISPOSABLE) ×1 IMPLANT
GOWN STRL REUS W/TWL LRG LVL3 (GOWN DISPOSABLE) ×3
GOWN STRL REUS W/TWL XL LVL3 (GOWN DISPOSABLE) ×3
KIT BASIN OR (CUSTOM PROCEDURE TRAY) ×3 IMPLANT
KIT TURNOVER KIT B (KITS) ×3 IMPLANT
LOOP VESSEL MAXI BLUE (MISCELLANEOUS) IMPLANT
MANIFOLD NEPTUNE II (INSTRUMENTS) ×2 IMPLANT
NDL HYPO 25X1 1.5 SAFETY (NEEDLE) IMPLANT
NEEDLE HYPO 25X1 1.5 SAFETY (NEEDLE) ×3 IMPLANT
NS IRRIG 1000ML POUR BTL (IV SOLUTION) ×3 IMPLANT
PACK ORTHO EXTREMITY (CUSTOM PROCEDURE TRAY) ×3 IMPLANT
PAD ARMBOARD 7.5X6 YLW CONV (MISCELLANEOUS) ×6 IMPLANT
PAD CAST 3X4 CTTN HI CHSV (CAST SUPPLIES) IMPLANT
PAD CAST 4YDX4 CTTN HI CHSV (CAST SUPPLIES) IMPLANT
PADDING CAST COTTON 3X4 STRL (CAST SUPPLIES) ×3
PADDING CAST COTTON 4X4 STRL (CAST SUPPLIES) ×3
SCRUB BETADINE 4OZ XXX (MISCELLANEOUS) ×3 IMPLANT
SET CYSTO W/LG BORE CLAMP LF (SET/KITS/TRAYS/PACK) ×2 IMPLANT
SOL PREP POV-IOD 4OZ 10% (MISCELLANEOUS) ×3 IMPLANT
SPLINT PLASTER EXTRA FAST 3X15 (CAST SUPPLIES) ×2
SPLINT PLASTER GYPS XFAST 3X15 (CAST SUPPLIES) IMPLANT
SPONGE LAP 4X18 RFD (DISPOSABLE) ×1 IMPLANT
SUT ETHILON 4 0 P 3 18 (SUTURE) IMPLANT
SUT ETHILON 4 0 PS 2 18 (SUTURE) IMPLANT
SUT MON AB 5-0 P3 18 (SUTURE) IMPLANT
SWAB COLLECTION DEVICE MRSA (MISCELLANEOUS) IMPLANT
SWAB CULTURE ESWAB REG 1ML (MISCELLANEOUS) ×2 IMPLANT
SYR CONTROL 10ML LL (SYRINGE) ×2 IMPLANT
TOWEL OR 17X26 10 PK STRL BLUE (TOWEL DISPOSABLE) ×3 IMPLANT
TUBE CONNECTING 12'X1/4 (SUCTIONS) ×1
TUBE CONNECTING 12X1/4 (SUCTIONS) ×2 IMPLANT
TUBE FEEDING ENTERAL 5FR 16IN (TUBING) IMPLANT
UNDERPAD 30X30 (UNDERPADS AND DIAPERS) ×3 IMPLANT
YANKAUER SUCT BULB TIP NO VENT (SUCTIONS) ×3 IMPLANT

## 2018-11-16 NOTE — Plan of Care (Signed)
  Problem: Coping: Goal: Level of anxiety will decrease Outcome: Progressing   Problem: Pain Managment: Goal: General experience of comfort will improve Outcome: Progressing   Problem: Safety: Goal: Ability to remain free from injury will improve Outcome: Progressing   

## 2018-11-16 NOTE — Op Note (Signed)
NAME: [name] MEDICAL RECORD NO: [id number] DATE OF BIRTH: 03/10/1932 FACILITY: [facility] LOCATION: [facility] OR PHYSICIAN: Tennis Must, MD   OPERATIVE REPORT   DATE OF PROCEDURE: 11/16/18    PREOPERATIVE DIAGNOSIS:   Left forearm infection   POSTOPERATIVE DIAGNOSIS:   Left forearm infection   PROCEDURE:   Incision and drainage left forearm both volar and dorsal   SURGEON:  Leanora Cover, M.D.   ASSISTANT: none   ANESTHESIA:  General   INTRAVENOUS FLUIDS:  Per anesthesia flow sheet.   ESTIMATED BLOOD LOSS:  Minimal.   COMPLICATIONS:  None.   SPECIMENS:   Cultures to micro   TOURNIQUET TIME:   Left arm: 33 minutes at 2500 mmHg   DISPOSITION:  Stable to PACU.   INDICATIONS: 85 year old male states he hit the back of his left hand and forearm on a doorknob 4 days ago sustaining a laceration.  He has since had increasing swelling pain and erythema of the left forearm.  On examination he had a wound on the dorsum of the left distal forearm with mild surrounding erythema.  He had erythema on the volar aspect of the forearm.  He was tender to palpation.  Recommended incision and drainage in the operating room. Risks, benefits and alternatives of surgery were discussed including the risks of blood loss, infection, damage to nerves, vessels, tendons, ligaments, bone for surgery, need for additional surgery, complications with wound healing, continued pain, stiffness, continued infection and need for repeat irrigation and debridement.  He voiced understanding of these risks and elected to proceed.  OPERATIVE COURSE:  After being identified preoperatively by myself,  the patient and I agreed on the procedure and site of the procedure.  The surgical site was marked.  Surgical consent had been signed. He was given IV antibiotics as preoperative antibiotic prophylaxis. He was transferred to the operating room and placed on the operating table in supine position with the Left upper extremity  on an arm board.  General anesthesia was induced by the anesthesiologist.  Left upper extremity was prepped and draped in normal sterile orthopedic fashion.  A surgical pause was performed between the surgeons, anesthesia, and operating room staff and all were in agreement as to the patient, procedure, and site of procedure.  Tourniquet at the proximal aspect of the extremity was inflated to 250 mmHg after exsanguination of the arm with an Esmarch bandage.    The dorsal wound was debrided.  The skin had rolled over.  The wound did not appear to go through the dermis.  Incision was made proximally from the wound.  There was edema within the subcutaneous tissues but no gross purulence.  The extensor tendons were identified and there was no purulence surrounding them.  Subcutaneous tissues were spread up underneath the dorsum of the hand partly.  Again there was edematous fluid but no purulence.  There was no odor.  Cultures taken for aerobes and anaerobes.  An additional incision was made on the volar aspect of the forearm in the area of greatest erythema which was at the distal ulnar aspect.  This was again carried in subcutaneous tissues by spreading technique.  Edematous fluid was seen.  There is no gross purulence.  There was no odor.  The fascia was incised and there was no fluid deep to the fascia.  The muscle was intact with good color.  This incision was made to approximate the halfway point of the forearm.  With the tourniquet up the erythematous  portion in the forearm was decreased.  The remaining erythematous portion was marked.  He also had a more purplish coloration under tourniquet.  There was noted to be some fluid underneath the epidermis when milked from proximally.  The epidermis had separated from the dermis underneath.  Portion of this was debrided proximally.  This would allow any drainage of fluid from underneath the epidermis.  The dermis itself was not incised in the proximal half of the  forearm.  The wound more distally was probed at its proximal extent.  That that did not easily separate from the dermis or the underlying fascia.  The wounds were all then copiously irrigated with sterile saline and packed with iodoform gauze.  They were injected with quarter percent plain Marcaine to aid in postoperative analgesia.  They were dressed with sterile Xeroform over the raw dermis and 4 x 4's and ABD and wrapped with Kerlix bandage.  Volar splints placed and wrapped with Kerlix and Ace bandage.  The tourniquet was deflated at 33 minutes.  Fingertips were pink with brisk capillary refill after deflation of tourniquet.  The operative  drapes were broken down.  The patient was awoken from anesthesia safely.  He was transferred back to the stretcher and taken to PACU in stable condition.  He is currently admitted to the family medicine teaching service.  He will continue on IV antibiotics.  Plan to start hydrotherapy in 2 to 3 days.   Leanora Cover, MD Electronically signed, 11/16/18

## 2018-11-16 NOTE — Anesthesia Preprocedure Evaluation (Signed)
Anesthesia Evaluation  Patient identified by MRN, date of birth, ID band Patient awake    Reviewed: Allergy & Precautions, H&P , NPO status , Patient's Chart, lab work & pertinent test results, reviewed documented beta blocker date and time   Airway Mallampati: II  TM Distance: >3 FB Neck ROM: Full    Dental no notable dental hx. (+) Edentulous Upper, Partial Lower, Dental Advisory Given   Pulmonary sleep apnea , COPD,  COPD inhaler, former smoker,    Pulmonary exam normal breath sounds clear to auscultation       Cardiovascular hypertension, Pt. on medications and Pt. on home beta blockers + CAD, + Past MI, + Cardiac Stents, + CABG and + Peripheral Vascular Disease  + dysrhythmias Atrial Fibrillation  Rhythm:Irregular Rate:Normal     Neuro/Psych Anxiety Depression negative neurological ROS     GI/Hepatic Neg liver ROS, GERD  Medicated and Controlled,  Endo/Other  diabetes, Type 2, Oral Hypoglycemic Agents  Renal/GU negative Renal ROS  negative genitourinary   Musculoskeletal   Abdominal   Peds  Hematology  (+) Blood dyscrasia, anemia ,   Anesthesia Other Findings   Reproductive/Obstetrics negative OB ROS                             Anesthesia Physical Anesthesia Plan  ASA: III  Anesthesia Plan: General   Post-op Pain Management:    Induction: Intravenous  PONV Risk Score and Plan: 3 and Ondansetron and Treatment may vary due to age or medical condition  Airway Management Planned: LMA  Additional Equipment:   Intra-op Plan:   Post-operative Plan: Extubation in OR  Informed Consent: I have reviewed the patients History and Physical, chart, labs and discussed the procedure including the risks, benefits and alternatives for the proposed anesthesia with the patient or authorized representative who has indicated his/her understanding and acceptance.     Dental advisory  given  Plan Discussed with: CRNA  Anesthesia Plan Comments:         Anesthesia Quick Evaluation

## 2018-11-16 NOTE — Plan of Care (Signed)
  Problem: Skin Integrity: Goal: Skin integrity will improve Outcome: Progressing   Problem: Health Behavior/Discharge Planning: Goal: Ability to manage health-related needs will improve Outcome: Progressing   Problem: Clinical Measurements: Goal: Will remain free from infection Outcome: Progressing   Problem: Activity: Goal: Risk for activity intolerance will decrease Outcome: Progressing   Problem: Nutrition: Goal: Adequate nutrition will be maintained Outcome: Progressing

## 2018-11-16 NOTE — Anesthesia Procedure Notes (Signed)
Procedure Name: LMA Insertion Date/Time: 11/16/2018 5:52 PM Performed by: Sammie Bench, CRNA Pre-anesthesia Checklist: Patient identified, Emergency Drugs available, Suction available and Patient being monitored Patient Re-evaluated:Patient Re-evaluated prior to induction Oxygen Delivery Method: Circle System Utilized Preoxygenation: Pre-oxygenation with 100% oxygen Induction Type: IV induction Ventilation: Mask ventilation without difficulty LMA: LMA inserted LMA Size: 4.0 Number of attempts: 1 Airway Equipment and Method: Bite block Placement Confirmation: positive ETCO2 Tube secured with: Tape Dental Injury: Teeth and Oropharynx as per pre-operative assessment

## 2018-11-16 NOTE — Discharge Instructions (Addendum)
You were admitted to the hospital for a cellulitis of your left arm.  While you were here, the infection was drained and you were treated with IV antibiotics.  Eventually, you were transitioned to oral antibiotics.  Your last day of antibiotic therapy will be on 6/26.  Please make sure that you follow-up with your surgeon for appropriate wound care good healing.  Please contact your surgeon for appropriate pain control.  While you were in the hospital, you were also found to have a new blood clot in your lung which was giving you significant trouble breathing.  The treatment for this blood clot is a blood thinner.  You are transitioned from Pradaxa to Eliquis.  Eliquis is your new blood thinner.  Please continue taking Eliquis twice daily as prescribed.  You also experienced some fluid overload also known as heart failure.  While you are here, we were able to get off this extra fluid so that you were breathing comfortably again.  This was additionally complicated by a little bit of a COPD flare.  We started you on a 5-day course of steroids.  Please complete your steroid course (last day 6/26).  Below, please find information from your surgeon.    [facility] Instructions Hand Surgery  Wound Care: Keep your hand elevated above the level of your heart.  Do not allow it to dangle by your side.  Keep the dressing dry and do not remove it unless your doctor advises you to do so.  He will usually change it at the time of your post-op visit.  Moving your fingers is advised to stimulate circulation but will depend on the site of your surgery.  If you have a splint applied, your doctor will advise you regarding movement.  Activity: Do not drive or operate machinery today.  Rest today and then you may return to your normal activity and work as indicated by your physician.  Diet:  Drink liquids today or eat a light diet.  You may resume a regular diet tomorrow.    General expectations: Pain for two to  three days. Fingers may become slightly swollen.  Call your doctor if any of the following occur: Severe pain not relieved by pain medication. Elevated temperature. Dressing soaked with blood. Inability to move fingers. White or bluish color to fingers.

## 2018-11-16 NOTE — Progress Notes (Addendum)
1553 Pt to short stay, A&O x4, he signed consent for surgery today. Report was given to [facility] stay. [name] son Cordero Surette pt's POA called. He wants to be called by the surgeon after surgery.

## 2018-11-16 NOTE — Consult Note (Signed)
[name] is an 85 y.o. male.   Chief Complaint: left arm cellulitis HPI: 85 yo male states he bumped left forearm on a doorknob ~ 4 days ago sustaining a wound to the dorsal forearm near wrist.  This initially bruised which tracked to volar forearm.  Has since had increasing erythema, pain, swelling.  No fevers, chills, night sweats.  Describes a sharp shooting pain that is alleviated with medication and aggravated with palpation.  Dr. Saul Fordyce note from 11/16/2018 reviewed. Xrays viewed and interpreted by me: ap and lateral left forearm and ap lateral oblique left hand/wrist show no fractures, dislocations, radioopaque foreign bodies. Labs reviewed: WBC 9.8 (down from 12.1 last PM)  Allergies:  Allergies  Allergen Reactions  . Bactrim [Sulfamethoxazole-Trimethoprim] Other (See Comments)    "does not do anything for me"  . Levaquin [Levofloxacin In D5w] Diarrhea    Past Medical History:  Diagnosis Date  . Acute myocardial infarction, unspecified site, initial episode of care   . Anemia   . Anxiety   . Aortic stenosis    . Atrial fibrillation ([place])    a. permanent, on Coumadin for anticoagulation  . CAD (coronary artery disease)    a. s/p CABG in 1994 b. cath in 2015 showing normal LM, 100% LCx and RCA stenosis with 50-60% prox LAD stenosis and 100% mid-LAD stenosis; patent sequential SVG-OM2-OM3 and patent LIMA-LAD with PTCA of distal LAD via LIMA graft performed at that time  . Carotid stenosis   . CHF (congestive heart failure) ([place])   . COPD (chronic obstructive pulmonary disease) ([place])   . Depression   . Diabetes mellitus without complication ([place])    FASTING 98-120S  . GERD (gastroesophageal reflux disease)   . History of blood transfusion   . History of peptic ulcer disease   . Hyperlipidemia   . Hypertension   . Myocardial infarction ([place])    X2  . Peripheral vascular disease ([place])   . Pneumonia    HX OF  . Renal artery stenosis ([place])   . Sleep apnea    OXYGEN  AT NIGHT 2L [place]    Past Surgical History:  Procedure Laterality Date  . AORTIC ARCH ANGIOGRAPHY N/A 08/14/2017   Procedure: AORTIC ARCH ANGIOGRAPHY;  Surgeon: Sherren Mocha, MD;  Location: [place] CV LAB;  Service: Cardiovascular;  Laterality: N/A;  . CARDIAC CATHETERIZATION    . CAROTID ENDARTERECTOMY    . COLON SURGERY    . CORONARY ARTERY BYPASS GRAFT  1994  . ENDARTERECTOMY Right 01/05/2013   Procedure: ENDARTERECTOMY CAROTID;  Surgeon: Rosetta Posner, MD;  Location: [place];  Service: Vascular;  Laterality: Right;  . KNEE SURGERY  08/2003   left knee/ARTHROSCOPIC  . LEFT HEART CATHETERIZATION WITH CORONARY/GRAFT ANGIOGRAM N/A 09/01/2013   Procedure: LEFT HEART CATHETERIZATION WITH Beatrix Fetters;  Surgeon: Sinclair Grooms, MD;  Location: [facility] CATH LAB;  Service: Cardiovascular;  Laterality: N/A;  . PATCH ANGIOPLASTY Right 01/05/2013   Procedure: PATCH ANGIOPLASTY;  Surgeon: Rosetta Posner, MD;  Location: [facility];  Service: Vascular;  Laterality: Right;  . PERCUTANEOUS CORONARY STENT INTERVENTION (PCI-S)  09/01/2013   Procedure: PERCUTANEOUS CORONARY STENT INTERVENTION (PCI-S);  Surgeon: Sinclair Grooms, MD;  Location: [facility] CATH LAB;  Service: Cardiovascular;;  . removal of bleeding ulcer  1994  . RENAL ARTERY STENT     stenting of the left renal artery as well as a cutting balloon angioplasty for treatment of in-stent restenosis coronary artery bypass grafting in  1994.  . RIGHT/LEFT HEART CATH AND CORONARY/GRAFT ANGIOGRAPHY N/A 08/14/2017   Procedure: RIGHT/LEFT HEART CATH AND CORONARY/GRAFT ANGIOGRAPHY;  Surgeon: Sherren Mocha, MD;  Location: [place] CV LAB;  Service: Cardiovascular;  Laterality: N/A;  . TEE WITHOUT CARDIOVERSION N/A 09/17/2017   Procedure: TRANSESOPHAGEAL ECHOCARDIOGRAM (TEE);  Surgeon: Sherren Mocha, MD;  Location: [facility];  Service: Open Heart Surgery;  Laterality: N/A;    Family History: Family History  Problem Relation Age of Onset  . Coronary artery  disease Mother   . Heart disease Mother        Before age 77  . Hyperlipidemia Mother   . Hypertension Mother   . Heart attack Mother   . Coronary artery disease Father   . Heart disease Father        After age 68  . Heart attack Father   . Heart disease Sister        After age 85  . Heart disease Brother        After age 45  . Coronary artery disease Other        13 sibling, almost all have coronary disease and some with premature onset  . Heart disease Other        13 sibling, almost all have coronary disease and some with premature onset    Social History:   reports that he quit smoking about 25 years ago. He has never used smokeless tobacco. He reports that he does not drink alcohol or use drugs.  Medications: Medications Prior to Admission  Medication Sig Dispense Refill  . acetaminophen (TYLENOL) 500 MG tablet Take 1,000 mg by mouth every 6 (six) hours as needed for moderate pain or headache.     [name] acyclovir (ZOVIRAX) 800 MG tablet Take 400 mg by mouth 2 (two) times daily.     [name] albuterol (PROVENTIL,VENTOLIN) 90 MCG/ACT inhaler Inhale 2 puffs into the lungs every 4 (four) hours as needed for shortness of breath.     [name] atorvastatin (LIPITOR) 80 MG tablet Take 1 tablet (80 mg total) by mouth at bedtime. 90 tablet 3  . brimonidine (ALPHAGAN) 0.2 % ophthalmic solution Place 1 drop into both eyes 2 (two) times daily.    . Cholecalciferol (VITAMIN D) 2000 UNITS CAPS Take 2,000 Units by mouth daily.    [name] CLARITIN 10 MG tablet Take 10 mg by mouth daily.    . dabigatran (PRADAXA) 150 MG CAPS Take 150 mg by mouth 2 (two) times daily.     . ferrous sulfate 325 (65 FE) MG tablet Take 325 mg by mouth daily with breakfast.     . furosemide (LASIX) 20 MG tablet Take 2 tablets (40 mg total) by mouth daily. 30 tablet 5  . ipratropium-albuterol (DUONEB) 0.5-2.5 (3) MG/3ML SOLN Take 3 mLs by nebulization 3 (three) times daily.     . lansoprazole (PREVACID) 30 MG capsule Take 30 mg by mouth  daily.      . metFORMIN (GLUCOPHAGE) 500 MG tablet Take 500 mg by mouth daily with breakfast.    . metoprolol (LOPRESSOR) 50 MG tablet Take 1 tablet (50 mg total) by mouth 2 (two) times daily. 180 tablet 4  . Multiple Vitamin (MULTIVITAMIN WITH MINERALS) TABS Take 1 tablet by mouth daily.    . multivitamin-lutein (OCUVITE-LUTEIN) CAPS Take 1 capsule by mouth daily.    . predniSONE (DELTASONE) 20 MG tablet Take 20 mg by mouth daily as needed (for gout attack).     . terazosin (HYTRIN)  5 MG capsule Take 5 mg by mouth at bedtime.     . trolamine salicylate (ASPERCREME) 10 % cream Apply 1 application topically 4 (four) times daily as needed for muscle pain.     [name] amoxicillin (AMOXIL) 500 MG tablet Take 4 tablets (2,000 mg total) by mouth as directed. Take one hour prior to dental visits. (Patient not taking: Reported on 11/15/2018) 12 tablet 12  . benazepril (LOTENSIN) 20 MG tablet Take 10 mg by mouth daily.      Results for orders placed or performed during the hospital encounter of 11/15/18 (from the past 48 hour(s))  Comprehensive metabolic panel     Status: Abnormal   Collection Time: 11/15/18  8:00 PM  Result Value Ref Range   Sodium 134 (L) 135 - 145 mmol/L   Potassium 4.1 3.5 - 5.1 mmol/L   Chloride 100 98 - 111 mmol/L   CO2 25 22 - 32 mmol/L   Glucose, Bld 220 (H) 70 - 99 mg/dL   BUN 15 8 - 23 mg/dL   Creatinine, Ser 1.04 0.61 - 1.24 mg/dL   Calcium 8.9 8.9 - 10.3 mg/dL   Total Protein 6.0 (L) 6.5 - 8.1 g/dL   Albumin 3.6 3.5 - 5.0 g/dL   AST 23 15 - 41 U/L   ALT 28 0 - 44 U/L   Alkaline Phosphatase 101 38 - 126 U/L   Total Bilirubin 1.1 0.3 - 1.2 mg/dL   GFR calc non Af Amer >60 >60 mL/min   GFR calc Af Amer >60 >60 mL/min   Anion gap 9 5 - 15    Comment: Performed at [facility], [address], [place], [place] [address]  Lactic acid, plasma     Status: None   Collection Time: 11/15/18  8:00 PM  Result Value Ref Range   Lactic Acid, Venous 1.7 0.5 - 1.9 mmol/L     Comment: Performed at [facility] [address] [place], [place] [address]  CBC with Differential     Status: Abnormal   Collection Time: 11/15/18  8:00 PM  Result Value Ref Range   WBC 12.1 (H) 4.0 - 10.5 K/uL   RBC 4.36 4.22 - 5.81 MIL/uL   Hemoglobin 13.0 13.0 - 17.0 g/dL   HCT 39.6 39.0 - 52.0 %   MCV 90.8 80.0 - 100.0 fL   MCH 29.8 26.0 - 34.0 pg   MCHC 32.8 30.0 - 36.0 g/dL   RDW 14.7 11.5 - 15.5 %   Platelets 188 150 - 400 K/uL   nRBC 0.0 0.0 - 0.2 %   Neutrophils Relative % 85 %   Neutro Abs 10.3 (H) 1.7 - 7.7 K/uL   Lymphocytes Relative 7 %   Lymphs Abs 0.8 0.7 - 4.0 K/uL   Monocytes Relative 7 %   Monocytes Absolute 0.9 0.1 - 1.0 K/uL   Eosinophils Relative 1 %   Eosinophils Absolute 0.1 0.0 - 0.5 K/uL   Basophils Relative 0 %   Basophils Absolute 0.0 0.0 - 0.1 K/uL   Immature Granulocytes 0 %   Abs Immature Granulocytes 0.05 0.00 - 0.07 K/uL    Comment: Performed at [facility] [address], [place], [place] [address]  Protime-INR     Status: Abnormal   Collection Time: 11/15/18  8:00 PM  Result Value Ref Range   Prothrombin Time 15.8 (H) 11.4 - 15.2 seconds   INR 1.3 (H) 0.8 - 1.2    Comment: (  NOTE) INR goal varies based on device and disease states. Performed at [facility], [place] [address] [place], [place] [address]   Culture, blood (Routine x 2)     Status: None (Preliminary result)   Collection Time: 11/15/18  8:00 PM   Specimen: BLOOD  Result Value Ref Range   Specimen Description BLOOD RIGHT ANTECUBITAL    Special Requests      BOTTLES DRAWN AEROBIC AND ANAEROBIC Blood Culture adequate volume   Culture      NO GROWTH < 12 HOURS Performed at [facility], [place] [address], [place], [place] [address]    Report Status PENDING   Culture, blood (Routine x 2)     Status: None (Preliminary result)   Collection Time: 11/15/18  8:35 PM   Specimen: BLOOD RIGHT HAND  Result Value Ref Range   Specimen Description BLOOD RIGHT  HAND    Special Requests      BOTTLES DRAWN AEROBIC AND ANAEROBIC Blood Culture results may not be optimal due to an inadequate volume of blood received in culture bottles   Culture      NO GROWTH < 12 HOURS Performed at [facility] [address], [place], [place] [address]    Report Status PENDING   SARS Coronavirus 2     Status: None   Collection Time: 11/15/18  9:13 PM  Result Value Ref Range   SARS Coronavirus 2 NOT DETECTED NOT DETECTED    Comment: (NOTE) SARS-CoV-2 target nucleic acids are NOT DETECTED. The SARS-CoV-2 RNA is generally detectable in upper and lower respiratory specimens during the acute phase of infection.  Negative  results do not preclude SARS-CoV-2 infection, do not rule out co-infections with other pathogens, and should not be used as the sole basis for treatment or other patient management decisions.  Negative results must be combined with clinical observations, patient history, and epidemiological information. The expected result is Not Detected. Fact Sheet for Patients: http://www.biofiredefense.com/wp-content/uploads/2020/03/BIOFIRE-COVID -19-patients.pdf Fact Sheet for Healthcare Providers: http://www.biofiredefense.com/wp-content/uploads/2020/03/BIOFIRE-COVID -19-hcp.pdf This test is not yet approved or cleared by the [place] and  has been authorized for detection and/or diagnosis of SARS-CoV-2 by FDA under an Emergency Use Authorization (EUA).  This EUA will remain in effec t (meaning this test can be used) for the duration of  the COVID-19 declaration under Section 564(b)(1) of the Act, 21 U.S.C. section 360bbb-3(b)(1), unless the authorization is terminated or revoked sooner. Performed at [facility], [place] [address] [place], [place] [address]   Lactic acid, plasma     Status: None   Collection Time: 11/16/18 12:50 AM  Result Value Ref Range   Lactic Acid, Venous 1.3 0.5 - 1.9 mmol/L    Comment: Performed at [facility] [address], [place], [place] [address]  Glucose, capillary     Status: Abnormal   Collection Time: 11/16/18  1:03 AM  Result Value Ref Range   Glucose-Capillary 142 (H) 70 - 99 mg/dL  Glucose, capillary     Status: Abnormal   Collection Time: 11/16/18  7:28 AM  Result Value Ref Range   Glucose-Capillary 127 (H) 70 - 99 mg/dL  CBC     Status: Abnormal   Collection Time: 11/16/18  8:06 AM  Result Value Ref Range   WBC 9.8 4.0 - 10.5 K/uL   RBC 4.01 (L) 4.22 - 5.81 MIL/uL   Hemoglobin 11.7 (L) 13.0 - 17.0 g/dL   HCT 36.2 (L) 39.0 -  52.0 %   MCV 90.3 80.0 - 100.0 fL   MCH 29.2 26.0 - 34.0 pg   MCHC 32.3 30.0 - 36.0 g/dL   RDW 14.8 11.5 - 15.5 %   Platelets 150 150 - 400 K/uL   nRBC 0.0 0.0 - 0.2 %    Comment: Performed at [facility], [place] [address], [place], [place] [address]  Basic metabolic panel     Status: Abnormal   Collection Time: 11/16/18  8:06 AM  Result Value Ref Range   Sodium 137 135 - 145 mmol/L   Potassium 3.9 3.5 - 5.1 mmol/L   Chloride 102 98 - 111 mmol/L   CO2 26 22 - 32 mmol/L   Glucose, Bld 135 (H) 70 - 99 mg/dL   BUN 12 8 - 23 mg/dL   Creatinine, Ser 0.90 0.61 - 1.24 mg/dL   Calcium 8.5 (L) 8.9 - 10.3 mg/dL   GFR calc non Af Amer >60 >60 mL/min   GFR calc Af Amer >60 >60 mL/min   Anion gap 9 5 - 15    Comment: Performed at [facility], [place] [address], [place],  [address]  Glucose, capillary     Status: Abnormal   Collection Time: 11/16/18 11:41 AM  Result Value Ref Range   Glucose-Capillary 129 (H) 70 - 99 mg/dL    Dg Forearm Left  Result Date: 11/15/2018 CLINICAL DATA:  Increased swelling in LEFT forearm, redness and fever after he hit his arm with a door, cellulitis EXAM: LEFT FOREARM - 2 VIEW COMPARISON:  None FINDINGS: Osseous demineralization. Wrist and elbow joint alignments normal. No fracture, dislocation, or bone destruction. Soft tissue swelling at the proximal forearm and elbow region without soft tissue  gas. Scattered atherosclerotic calcifications. IMPRESSION: No acute osseous abnormalities. Electronically Signed   By: Lavonia Dana M.D.   On: 11/15/2018 21:08   Dg Hand Complete Left  Result Date: 11/15/2018 CLINICAL DATA:  Increased swelling in LEFT forearm, redness and fever after he hit his arm with a door, cellulitis EXAM: LEFT HAND - COMPLETE 3+ VIEW COMPARISON:  None FINDINGS: Osseous demineralization. Joint spaces preserved. No fracture, dislocation, or bone destruction. Scattered soft tissue swelling and atherosclerotic calcifications. IMPRESSION: No acute osseous abnormalities. Electronically Signed   By: Lavonia Dana M.D.   On: 11/15/2018 21:09     A comprehensive review of systems was negative except for: Hematologic/lymphatic: positive for easy bruising Review of Systems: No fevers, chills, night sweats, chest pain, shortness of breath, nausea, vomiting, diarrhea, constipation, headaches, dizziness, vision changes, fainting.   Blood pressure 117/60, pulse 74, temperature 99.1 F (37.3 C), temperature source Oral, resp. rate 17, height 6' (1.829 m), weight 90.7 kg, SpO2 97 %.  General appearance: alert, cooperative and appears stated age Head: Normocephalic, without obvious abnormality, atraumatic Neck: supple, symmetrical, trachea midline Extremities: Intact sensation and capillary refill all digits.  +epl/fpl/io.  Able to move digits, wrist, elbow without pain.  Left dorsal forearm with scabbed over wound distally.  Mild swelling surrounding this with mild erythematous change.  Volar forearm with erythema and warmth.  Tender to light touch volarly.  Erythema extends to upper arm.  Has receded slightly from markings patient states were made last night. Pulses: 2+ and symmetric Skin: Skin color, texture, turgor normal. No rashes or lesions Neurologic: Grossly normal Incision/Wound: as above  Assessment/Plan Left forearm infected wound and cellulitis.  Recommend incision and drainage  in OR.  Given valve replacement, would favor this  to gain control of infection to prevent seeding of valve.  Also discussed possibility of watching how antibiotics do for 24 hours.  He wishes to proceed with operative treatment.  Risks, benefits and alternatives of surgery were discussed including risks of blood loss, infection, damage to nerves/vessels/tendons/ligament/bone, failure of surgery, need for additional surgery, complication with wound healing, stiffness, need for repeat irrigation and debridement.   He voiced understanding of these risks and elected to proceed.    Leanora Cover 11/16/2018, 2:49 PM

## 2018-11-16 NOTE — Progress Notes (Signed)
Family Medicine Teaching Service Daily Progress Note Intern Pager: [phone]  Patient name: [name] Medical record number: [id number] Date of birth: 1932-03-14 Age: 85 y.o. Gender: male  Primary Care Provider: Nicoletta Dress, MD Consultants: none Code Status: Full  Pt Overview and Major Events to Date:  6/13 admitted with L arm cellulitis  Assessment and Plan: [name] is a 85 y.o. male presenting with L arm redness. PMH is significant for DM, Afib on pradaxa, aortic stenosis s/p TAVR, PVD, CAD s/p CABG, HTN, COPD, HLD, HFpEF, GERD.  L arm cellulitis. Continues to be afebrile and WBC this morning still pending. Erythema within marked borders.  -Continue Ancef (6/13-) -morphine q2prn  -Follow-up blood cultures -Monitor for fever -Trend white count  A Fib.  Stable, rate controlled -Continue home Pradaxa 150 mg twice daily -Continue home metoprolol 50 mg twice daily  Diabetes.  Last A1c viewable is 6.1 on 09/13/2017.  Patient glucose on admission 220 -Continue home metformin 500 mg daily -Monitor CBGs in the setting of acute infection  PVD, CAD s/p CABG, HLD -Continue home atorvastatin   GERD -Pantoprazole 40 mg daily  HFpEF, HTN  Stable -Continue home Lasix 40 mg daily -Continue home terazosin 5 mg daily -Continue home metoprolol as per above  FEN/GI: Heart healthy carb modified Prophylaxis: Pradaxa   Disposition: pending medical management  Subjective:  Patient states that L arm continues to be quite painful. No fevers.  Objective: Temp:  [99.4 F (37.4 C)-99.9 F (37.7 C)] 99.8 F (37.7 C) (06/14 0417) Pulse Rate:  [70-95] 82 (06/14 0417) Resp:  [14-18] 14 (06/14 0417) BP: (106-131)/(50-63) 123/50 (06/14 0417) SpO2:  [92 %-99 %] 98 % (06/14 0417) Weight:  [90.7 kg] 90.7 kg (06/13 1951) Physical Exam: General: laying in bed in NAD Cardiovascular: irregular irregular, soft murmur Respiratory: CTAB, NWOB on RA Abdomen: soft,  nontender, nondistended, + bowel sounds Extremities: L forearm with erythema within marked borders, tender to palpation and with 1+ pitting edema  Laboratory: Recent Labs  Lab 11/15/18 2000  WBC 12.1*  HGB 13.0  HCT 39.6  PLT 188   Recent Labs  Lab 11/15/18 2000  NA 134*  K 4.1  CL 100  CO2 25  BUN 15  CREATININE 1.04  CALCIUM 8.9  PROT 6.0*  BILITOT 1.1  ALKPHOS 101  ALT 28  AST 23  GLUCOSE 220*    Imaging/Diagnostic Tests: Dg Forearm Left  Result Date: 11/15/2018 CLINICAL DATA:  Increased swelling in LEFT forearm, redness and fever after he hit his arm with a door, cellulitis EXAM: LEFT FOREARM - 2 VIEW COMPARISON:  None FINDINGS: Osseous demineralization. Wrist and elbow joint alignments normal. No fracture, dislocation, or bone destruction. Soft tissue swelling at the proximal forearm and elbow region without soft tissue gas. Scattered atherosclerotic calcifications. IMPRESSION: No acute osseous abnormalities. Electronically Signed   By: Lavonia Dana M.D.   On: 11/15/2018 21:08   Dg Hand Complete Left  Result Date: 11/15/2018 CLINICAL DATA:  Increased swelling in LEFT forearm, redness and fever after he hit his arm with a door, cellulitis EXAM: LEFT HAND - COMPLETE 3+ VIEW COMPARISON:  None FINDINGS: Osseous demineralization. Joint spaces preserved. No fracture, dislocation, or bone destruction. Scattered soft tissue swelling and atherosclerotic calcifications. IMPRESSION: No acute osseous abnormalities. Electronically Signed   By: Lavonia Dana M.D.   On: 11/15/2018 21:09    Bufford Lope, DO 11/16/2018, 6:37 AM PGY-3, [facility] Intern pager: [phone], text pages  welcome

## 2018-11-16 NOTE — Anesthesia Postprocedure Evaluation (Signed)
Anesthesia Post Note  Patient: [name]  Procedure(s) Performed: INCISION AND DRAINAGE LEFT FOREARM/ARM (Left Arm Lower)     Patient location during evaluation: PACU Anesthesia Type: General Level of consciousness: awake and alert Pain management: pain level controlled Vital Signs Assessment: post-procedure vital signs reviewed and stable Respiratory status: spontaneous breathing, nonlabored ventilation, respiratory function stable and patient connected to nasal cannula oxygen Cardiovascular status: blood pressure returned to baseline and stable Postop Assessment: no apparent nausea or vomiting Anesthetic complications: no    Last Vitals:  Vitals:   11/16/18 1915 11/16/18 1930  BP: (!) 125/47 (!) 123/57  Pulse: 86 85  Resp: 18 13  Temp:    SpO2: 98% 97%    Last Pain:  Vitals:   11/16/18 1920  TempSrc:   PainSc: 8                  Tanay Massiah,W. EDMOND

## 2018-11-16 NOTE — Progress Notes (Signed)
Pharmacy Antibiotic Note  [name] is a 85 y.o. male admitted on 11/15/2018 with cellulitis of LUE. Initially treated with Ancef. Pharmacy has been consulted for Vancomycin dosing. Pt now s/p OR for I&D, to continue vancomycin and add Unasyn.  Plan: Unasyn 3g IV q6h  Height: 6' (182.9 cm) Weight: 200 lb (90.7 kg) IBW/kg (Calculated) : 77.6  Temp (24hrs), Avg:99.1 F (37.3 C), Min:97.7 F (36.5 C), Max:99.9 F (37.7 C)  Recent Labs  Lab 11/15/18 2000 11/16/18 0050 11/16/18 0806  WBC 12.1*  --  9.8  CREATININE 1.04  --  0.90  LATICACIDVEN 1.7 1.3  --     Estimated Creatinine Clearance: 65.9 mL/min (by C-G formula based on SCr of 0.9 mg/dL).    Allergies  Allergen Reactions  . Bactrim [Sulfamethoxazole-Trimethoprim] Other (See Comments)    "does not do anything for me"  . Levaquin [Levofloxacin In D5w] Diarrhea    Antimicrobials this admission: 6/13 Ancef >> 6/14 6/14 Vancomycin >>  6/14 Unasyn >>  Microbiology results: 6/13 BCx: ngtd 6/13 Covid: neg  Thank you for allowing pharmacy to be a part of this patient's care.   Arrie Senate, PharmD, BCPS Clinical Pharmacist [phone] Please check AMION for all [facility] numbers 11/16/2018

## 2018-11-16 NOTE — Progress Notes (Signed)
Spoke with patient regarding CODE STATUS.  Patient wishes to be full code including CPR, medications, electric shocks, intubation, ventilation.

## 2018-11-16 NOTE — Transfer of Care (Signed)
Immediate Anesthesia Transfer of Care Note  Patient: [name]  Procedure(s) Performed: INCISION AND DRAINAGE LEFT FOREARM/ARM (Left Arm Lower)  Patient Location: PACU  Anesthesia Type:General  Level of Consciousness: awake, alert  and oriented  Airway & Oxygen Therapy: Patient Spontanous Breathing and Patient connected to face mask oxygen  Post-op Assessment: Report given to RN and Post -op Vital signs reviewed and stable  Post vital signs: Reviewed and stable  Last Vitals:  Vitals Value Taken Time  BP 130/61 11/16/18 1856  Temp    Pulse 89 11/16/18 1859  Resp 21 11/16/18 1859  SpO2 99 % 11/16/18 1859  Vitals shown include unvalidated device data.  Last Pain:  Vitals:   11/16/18 1515  TempSrc: Oral  PainSc:          Complications: No apparent anesthesia complications.  C/O "sore" IV.  Took covering bandages off IV to visualize site- PIV appears to be in good position; runs well still; arm around PIV is bruised and red but patient states it has been that way.  Will follow and PACU RN's aware.  Patient reports some relief after bulky gauze dressings removed from [facility] PIV

## 2018-11-16 NOTE — Progress Notes (Signed)
Postop note: Status post incision and drainage of left forearm both volar and dorsal.  Edematous fluid encountered.  No gross purulence.  There was no odor.  Tacking placed in the wounds.  There was some epidermal lysis in the proximal aspect of the forearm volarly and the epidermis was debrided.  Erythema was traced at completion of the procedure.  There was some extension of erythema in the upper forearm felt due to the tourniquet.  Continue IV antibiotics.  Plan to start hydrotherapy in 2 to 3 days.

## 2018-11-16 NOTE — Evaluation (Signed)
Physical Therapy Evaluation Patient Details Name: [name] MRN: [id number] DOB: [birth year] Today's Date: 11/16/2018   History of Present Illness  85 y.o. male admitted on 11/15/2018 with cellulitis of LUE  Clinical Impression  Patient seen for therapy assessment.  Mobilizing well with no noted focal deficits at this time. Educated patient on elevation of LUE for edema control. No further acute PT needs. Will sign off. Please consider [id number] if patient digresses from physical mobility standpoint. Thank you.   Follow Up Recommendations No PT follow up    Equipment Recommendations  None recommended by PT    Recommendations for Other Services       Precautions / Restrictions        Mobility  Bed Mobility Overal bed mobility: Modified Independent                Transfers Overall transfer level: Modified independent               General transfer comment: No physical assist  Ambulation/Gait Ambulation/Gait assistance: Modified independent (Device/Increase time) Gait Distance (Feet): 210 Feet Assistive device: IV Pole Gait Pattern/deviations: Shuffle;Decreased stride length Gait velocity: decreased Gait velocity interpretation: <1.8 ft/sec, indicate of risk for recurrent falls General Gait Details: modest instability, no physical assist required, no over LOB  Stairs Stairs: Yes Stairs assistance: Modified independent (Device/Increase time) Stair Management: Two rails Number of Stairs: 5 General stair comments: no physical assist  Wheelchair Mobility    Modified Rankin (Stroke Patients Only)       Balance Overall balance assessment: Modified Independent                           High level balance activites: Side stepping;Backward walking;Direction changes;Turns;Sudden stops;Head turns High Level Balance Comments: no difficulty             Pertinent Vitals/Pain Pain Assessment: Faces Faces Pain Scale: Hurts a little  bit Pain Location: left arm Pain Descriptors / Indicators: Sore Pain Intervention(s): Monitored during session    Home Living Family/patient expects to be discharged to:: Private residence Living Arrangements: Spouse/significant other Available Help at Discharge: Family Type of Home: House Home Access: Stairs to enter Entrance Stairs-Rails: Right Entrance Stairs-Number of Steps: 6 Home Layout: One level Home Equipment: Environmental consultant - 2 wheels;Cane - single point;Shower seat;Crutches      Prior Function Level of Independence: Independent               Hand Dominance   Dominant Hand: Right    Extremity/Trunk Assessment   Upper Extremity Assessment Upper Extremity Assessment: Overall WFL for tasks assessed(did not formally assess)    Lower Extremity Assessment Lower Extremity Assessment: Overall WFL for tasks assessed       Communication   Communication: No difficulties  Cognition Arousal/Alertness: Awake/alert Behavior During Therapy: WFL for tasks assessed/performed Overall Cognitive Status: Within Functional Limits for tasks assessed                                        General Comments      Exercises     Assessment/Plan    PT Assessment Patent does not need any further PT services  PT Problem List         PT Treatment Interventions      PT Goals (Current goals can be found in the Care Plan section)  Acute Rehab PT Goals PT Goal Formulation: All assessment and education complete, DC therapy    Frequency     Barriers to discharge        Co-evaluation               AM-PAC PT "6 Clicks" Mobility  Outcome Measure Help needed turning from your back to your side while in a flat bed without using bedrails?: None Help needed moving from lying on your back to sitting on the side of a flat bed without using bedrails?: None Help needed moving to and from a bed to a chair (including a wheelchair)?: None Help needed standing up  from a chair using your arms (e.g., wheelchair or bedside chair)?: None Help needed to walk in hospital room?: A Little Help needed climbing 3-5 steps with a railing? : A Little 6 Click Score: 22    End of Session   Activity Tolerance: Patient tolerated treatment well Patient left: in bed;with bed alarm set;with call bell/phone within reach Nurse Communication: Mobility status PT Visit Diagnosis: Pain Pain - Right/Left: Left Pain - part of body: Arm    Time: 1250-1308 PT Time Calculation (min) (ACUTE ONLY): 18 min   Charges:   PT Evaluation $PT Eval Low Complexity: [place], PT DPT  Board Certified Neurologic Specialist Acute Rehabilitation Services Pager [phone] Office [place] 11/16/2018, 1:46 PM

## 2018-11-16 NOTE — Progress Notes (Signed)
Pharmacy Antibiotic Note  [name] is a 85 y.o. male admitted on 11/15/2018 with cellulitis of LUE. Initially treated with Ancef. Pharmacy has been consulted for Vancomycin dosing.  Plan: Discontinue Ancef (would be duplicate coverage) Vancomycin 1750mg  q24, for AUC 500, using SCr 1.0  Height: 6' (182.9 cm) Weight: 200 lb (90.7 kg) IBW/kg (Calculated) : 77.6  Temp (24hrs), Avg:99.6 F (37.6 C), Min:99.1 F (37.3 C), Max:99.9 F (37.7 C)  Recent Labs  Lab 11/15/18 2000 11/16/18 0050 11/16/18 0806  WBC 12.1*  --  9.8  CREATININE 1.04  --  0.90  LATICACIDVEN 1.7 1.3  --     Estimated Creatinine Clearance: 65.9 mL/min (by C-G formula based on SCr of 0.9 mg/dL).    Allergies  Allergen Reactions  . Bactrim [Sulfamethoxazole-Trimethoprim] Other (See Comments)    "does not do anything for me"  . Levaquin [Levofloxacin In D5w] Diarrhea    Antimicrobials this admission: 6/13 Ancef >> 6/14 6/14 Vancomycin  >>   Dose adjustments this admission:  Microbiology results: 6/13 BCx: ngtd 6/13 Covid: neg  Thank you for allowing pharmacy to be a part of this patient's care.  Minda Ditto PharmD [phone] 11/16/2018 11:08 AM

## 2018-11-17 ENCOUNTER — Encounter (HOSPITAL_COMMUNITY): Payer: Self-pay | Admitting: Orthopedic Surgery

## 2018-11-17 DIAGNOSIS — I480 Paroxysmal atrial fibrillation: Secondary | ICD-10-CM

## 2018-11-17 LAB — GLUCOSE, CAPILLARY
Glucose-Capillary: 165 mg/dL — ABNORMAL HIGH (ref 70–99)
Glucose-Capillary: 165 mg/dL — ABNORMAL HIGH (ref 70–99)
Glucose-Capillary: 146 mg/dL — ABNORMAL HIGH (ref 70–99)
Glucose-Capillary: 158 mg/dL — ABNORMAL HIGH (ref 70–99)

## 2018-11-17 LAB — BASIC METABOLIC PANEL
Anion gap: 8 (ref 5–15)
BUN: 13 mg/dL (ref 8–23)
CO2: 25 mmol/L (ref 22–32)
Calcium: 8.4 mg/dL — ABNORMAL LOW (ref 8.9–10.3)
Chloride: 103 mmol/L (ref 98–111)
Creatinine, Ser: 1.12 mg/dL (ref 0.61–1.24)
GFR calc Af Amer: >60 mL/min (ref >60)
GFR calc non Af Amer: 60 mL/min — ABNORMAL LOW (ref >60)
Glucose, Bld: 157 mg/dL — ABNORMAL HIGH (ref 70–99)
Potassium: 3.7 mmol/L (ref 3.5–5.1)
Sodium: 136 mmol/L (ref 135–145)

## 2018-11-17 LAB — CBC
HCT: 32.9 % — ABNORMAL LOW (ref 39.0–52.0)
Hemoglobin: 10.8 g/dL — ABNORMAL LOW (ref 13.0–17.0)
MCH: 29.3 pg (ref 26.0–34.0)
MCHC: 32.8 g/dL (ref 30.0–36.0)
MCV: 89.2 fL (ref 80.0–100.0)
Platelets: 141 10*3/uL — ABNORMAL LOW (ref 150–400)
RBC: 3.69 MIL/uL — ABNORMAL LOW (ref 4.22–5.81)
RDW: 14.6 % (ref 11.5–15.5)
WBC: 9.1 10*3/uL (ref 4.0–10.5)
nRBC: 0 % (ref 0.0–0.2)

## 2018-11-17 MED ORDER — HEPARIN SODIUM (PORCINE) 5000 UNIT/ML IJ SOLN
5000.0000 [IU] | Freq: Three times a day (TID) | INTRAMUSCULAR | Status: DC
  Administered 2018-11-17 – 2018-11-20 (×10): 5000 [IU] via SUBCUTANEOUS
  Filled 2018-11-17 (×10): qty 1

## 2018-11-17 MED ORDER — UMECLIDINIUM BROMIDE 62.5 MCG/INH IN AEPB
1.0000 | INHALATION_SPRAY | Freq: Every day | RESPIRATORY_TRACT | Status: DC
  Administered 2018-11-18 – 2018-11-26 (×9): 1 via RESPIRATORY_TRACT
  Filled 2018-11-17 (×2): qty 7

## 2018-11-17 MED ORDER — FUROSEMIDE 40 MG PO TABS
40.0000 mg | ORAL_TABLET | Freq: Every day | ORAL | Status: DC
  Administered 2018-11-17 – 2018-11-20 (×4): 40 mg via ORAL
  Filled 2018-11-17 (×4): qty 1

## 2018-11-17 NOTE — Progress Notes (Addendum)
Family Medicine Teaching Service Daily Progress Note Intern Pager: [phone]  Patient name: [name] Medical record number: [id number] Date of birth: Oct 07, 1931 Age: 85 y.o. Gender: male  Primary Care Provider: Nicoletta Dress, MD Consultants: none Code Status: Full  Pt Overview and Major Events to Date:  6/13 admitted with L arm cellulitis  Assessment and Plan: [name] is a 85 y.o. male presenting with L arm redness. PMH is significant for DM, Afib on pradaxa, aortic stenosis s/p TAVR, PVD, CAD s/p CABG, HTN, COPD, HLD, HFpEF, GERD.  L arm cellulitis. S/p incision and drainage in the OR on 6/44 without complication.  He has remained afebrile with a normal heart rate and respiratory rate since the procedure.  Currently planning for least 24 hours of surveillance with IV antibiotic before discharge. -Blood cultures show no growth to date on 6/15 -DC Ancef (6/13-6/14) -Vanc (6/14- ) -Unasyn (6/14 - ) -morphine q2prn  -Follow-up blood cultures  A Fib.  Stable, rate controlled -holding Pradaxa 150 mg twice daily -metoprolol 50 mg twice daily  Diabetes.  Last A1c viewable is 6.1 on 09/13/2017.  -holding metformin 500 mg daily -Monitor CBGs in the setting of acute infection  PVD, CAD s/p CABG, HLD -Continue home atorvastatin   GERD -Pantoprazole 40 mg daily  HFpEF, HTN  Stable - Lasix 40 mg daily - terazosin 5 mg daily - metoprolol as per above  FEN/GI: Heart healthy carb modified Prophylaxis: Pradaxa   Disposition: 1-2 additional days of hospitalization anticipated prior to discharge  Subjective:  No acute events overnight.  No new complaints this morning.  He reports that the erythema in his arm seems to be receding at some places worsening in others.  He reports good hand function and less pain since his procedure.  He reports that he has been urinating and passing gas and eating well since his procedure.  He has not yet had a bowel movement since  his procedure.  Objective: Temp:  [97.7 F (36.5 C)-99.3 F (37.4 C)] 98.3 F (36.8 C) (06/15 0221) Pulse Rate:  [74-93] 78 (06/15 0221) Resp:  [13-23] 17 (06/14 1945) BP: (112-147)/(47-61) 112/59 (06/15 0221) SpO2:  [90 %-99 %] 90 % (06/15 0221) Physical Exam: General: Alert and cooperative and appears to be in no acute distress.  Sitting in bed comfortably with his left arm wrapped s/p procedure. Cardio: Normal S1 and S2, no S3 or S4. Rhythm is regular. No murmurs or rubs.   Pulm: Breathing comfortably on room air.  Rhonchi noted middle fields bilaterally.  No wheezing/stridor noted. Abdomen: Bowel sounds normal. Abdomen soft and non-tender.  Extremities: No peripheral edema. Warm/ well perfused.  Strong radial pulse. Neuro: Cranial nerves grossly intact   Laboratory: Recent Labs  Lab 11/15/18 2000 11/16/18 0806 11/17/18 0446  WBC 12.1* 9.8 9.1  HGB 13.0 11.7* 10.8*  HCT 39.6 36.2* 32.9*  PLT 188 150 141*   Recent Labs  Lab 11/15/18 2000 11/16/18 0806  NA 134* 137  K 4.1 3.9  CL 100 102  CO2 25 26  BUN 15 12  CREATININE 1.04 0.90  CALCIUM 8.9 8.5*  PROT 6.0*  --   BILITOT 1.1  --   ALKPHOS 101  --   ALT 28  --   AST 23  --   GLUCOSE 220* 135*    Imaging/Diagnostic Tests: No results found.  Matilde Haymaker, MD 11/17/2018, 6:15 AM PGY-1, [facility] Intern pager: [phone], text pages welcome

## 2018-11-17 NOTE — Progress Notes (Signed)
Spoke with pt's daughter and updated her on plan of care. Pt also spoke with son and updated him. Pt given room number for wife who is currently on 2C.

## 2018-11-17 NOTE — Significant Event (Signed)
For 6/15 1900 to 6/16 at 0700, please page [phone].  Pager [phone] is not working.  [place] Constable, D.O.  PGY-1 Family Medicine  11/17/2018 9:58 PM

## 2018-11-17 NOTE — Progress Notes (Signed)
Subjective: 1 Day Post-Op Procedure(s) (LRB): INCISION AND DRAINAGE LEFT FOREARM/ARM (Left) Patient reports pain as sore but manageable.  Overall feels better than yesterday.    Objective: Vital signs in last 24 hours: Temp:  [97.7 F (36.5 C)-99.3 F (37.4 C)] 98.5 F (36.9 C) (06/15 1206) Pulse Rate:  [72-170] 81 (06/15 1207) Resp:  [13-23] 16 (06/15 1206) BP: (112-147)/(47-64) 134/64 (06/15 1206) SpO2:  [90 %-100 %] 97 % (06/15 1207)  Intake/Output from previous day: 06/14 0701 - 06/15 0700 In: 1660.4 [P.O.:560; I.V.:500.4; IV Piggyback:600] Out: 605 [Urine:600; Blood:5] Intake/Output this shift: Total I/O In: 240 [P.O.:240] Out: -   Recent Labs    11/15/18 2000 11/16/18 0806 11/17/18 0446  HGB 13.0 11.7* 10.8*   Recent Labs    11/16/18 0806 11/17/18 0446  WBC 9.8 9.1  RBC 4.01* 3.69*  HCT 36.2* 32.9*  PLT 150 141*   Recent Labs    11/15/18 2000 11/16/18 0806  NA 134* 137  K 4.1 3.9  CL 100 102  CO2 25 26  BUN 15 12  CREATININE 1.04 0.90  GLUCOSE 220* 135*  CALCIUM 8.9 8.5*   Recent Labs    11/15/18 2000  INR 1.3*    Moving all digits.  Dressing c/d/i.  Erythema on posterior arm resolved and appears more like bruising today.  Erythema on medial elbow and arm receding but still present.  Less hot than yesterday.   Assessment/Plan: 1 Day Post-Op Procedure(s) (LRB): INCISION AND DRAINAGE LEFT FOREARM/ARM (Left) Continue antibiotics.  Will plan to start hydrotherapy in 1-2 days.  Non toxic appearing with normal WBC and afebrile.  Would like to see erythema receding before discharge.  Can perform hydrotherapy in office after d/c home.   [place] Cover 11/17/2018, 1:36 PM

## 2018-11-17 NOTE — Evaluation (Addendum)
Occupational Therapy Evaluation Patient Details Name: [name] MRN: [id number] DOB: Mar 15, 1932 Today's Date: 11/17/2018    History of Present Illness 85 y.o. male admitted on 11/15/2018 with cellulitis of LUE   Clinical Impression   Pt with L UE cellulitis with I & D. Pt is Mod I with functional mobility and min A required with ADLs due to edema in L UE and L UE with ace dressing. Educated pt on wearing elastic waist pants and compensatory ADL techniques. Pt instructed in L UE digit flexion/extension and elbow/shoulder AROM. Pt states that hi niece will assist hi at home as needed as his wife in also in hospital at this time. Pt verbalizes and demonstrates good understanding of compensatory ADL techniques and L UE ROM exercises and edema mgt. All education completed and no further acute OT is indicated at this time    Follow Up Recommendations  Other (comment);Outpatient OT(follow up with OT for L UE as appropriate per MD)    Equipment Recommendations  None recommended by OT    Recommendations for Other Services       Precautions / Restrictions Precautions Precaution Comments: L UE edematous with ace dressing Restrictions Weight Bearing Restrictions: No Other Position/Activity Restrictions: L UE edematous and in ace dressing      Mobility Bed Mobility Overal bed mobility: Modified Independent                Transfers Overall transfer level: Modified independent Equipment used: None Transfers: Sit to/from [facility] Sit to Stand: Modified independent (Device/Increase time)              Balance Overall balance assessment: No apparent balance deficits (not formally assessed)                                         ADL either performed or assessed with clinical judgement   ADL Overall ADL's : Needs assistance/impaired Eating/Feeding: Set up;Sitting   Grooming: Wash/dry hands;Wash/dry face;Oral care;Set up;Standing    Upper Body Bathing: Minimal assistance   Lower Body Bathing: Minimal assistance   Upper Body Dressing : Minimal assistance   Lower Body Dressing: Minimal assistance   Toilet Transfer: Modified Independent;Ambulation   Toileting- Clothing Manipulation and Hygiene: Minimal assistance   Tub/ Shower Transfer: Ambulation;Modified independent   Functional mobility during ADLs: Modified independent General ADL Comments: min A required with ADLs due to edema in L UE and L UE with ace dressing. Educated pt on wearing elastic waist pants and compensatory ADL techniques     Vision Baseline Vision/History: Wears glasses Wears Glasses: Reading only Patient Visual Report: No change from baseline       Perception     Praxis      Pertinent Vitals/Pain Pain Assessment: 0-10 Pain Score: 5  Pain Location: L UE Pain Descriptors / Indicators: Sore;Tender Pain Intervention(s): Monitored during session;Premedicated before session     Hand Dominance Right   Extremity/Trunk Assessment Upper Extremity Assessment Upper Extremity Assessment: Overall WFL for tasks assessed;LUE deficits/detail LUE Deficits / Details: edemtous L hand/foreram, ace dressing. Digit flexion impaired LUE: Unable to fully assess due to immobilization LUE Sensation: decreased light touch LUE Coordination: decreased fine motor;decreased gross motor       Cervical / Trunk Assessment Cervical / Trunk Assessment: Normal   Communication Communication Communication: No difficulties   Cognition   Behavior During Therapy: [facility] for tasks assessed/performed  Overall Cognitive Status: Within Functional Limits for tasks assessed                                     General Comments       Exercises Other Exercises Other Exercises: Pt instructed on gentle flexion of L digits, elbow and shoulder AROM Other Exercises: Pt educated on edema mgt elevating L UE when at rest   Shoulder Instructions      [place] expects to be discharged to:: Private residence Living Arrangements: Spouse/significant other Available Help at Discharge: Family Type of Home: House Home Access: Stairs to enter Technical brewer of Steps: 6 Entrance Stairs-Rails: Right Home Layout: One level     Bathroom Shower/Tub: Tub/shower unit;Walk-in shower   Bathroom Toilet: Standard     Home Equipment: Cane - single point;Walker - 2 wheels;Shower seat          Prior Functioning/Environment Level of Independence: Independent                 OT Problem List: Decreased strength;Pain;Decreased range of motion;Decreased coordination;Impaired sensation;Impaired UE functional use      OT Treatment/Interventions:      OT Goals(Current goals can be found in the care plan section) Acute Rehab OT Goals Patient Stated Goal: go home OT Goal Formulation: With patient  OT Frequency:     Barriers to D/C:    no barriers, pt states that hsi niece will be assisting him at home       Co-evaluation              AM-PAC OT "6 Clicks" Daily Activity     Outcome Measure Help from another person eating meals?: None Help from another person taking care of personal grooming?: A Little Help from another person toileting, which includes using toliet, bedpan, or urinal?: A Little Help from another person bathing (including washing, rinsing, drying)?: A Little Help from another person to put on and taking off regular upper body clothing?: A Little Help from another person to put on and taking off regular lower body clothing?: A Little 6 Click Score: 19   End of Session    Activity Tolerance: Patient tolerated treatment well Patient left: in bed;with bed alarm set;with call bell/phone within reach  OT Visit Diagnosis: Muscle weakness (generalized) (M62.81);Pain Pain - Right/Left: Left Pain - part of body: Arm;Hand                Time: 12:05-12:31   Charges:  OT General Charges $OT Visit: 1  Visit OT Evaluation $OT Eval Low Complexity: 1 Low OT Treatments $Self Care/Home Management : 8-22 mins $Therapeutic Exercise: 8-22 mins    Britt Bottom 11/17/2018, 1:02 PM

## 2018-11-18 DIAGNOSIS — J9611 Chronic respiratory failure with hypoxia: Secondary | ICD-10-CM

## 2018-11-18 LAB — BASIC METABOLIC PANEL
Anion gap: 9 (ref 5–15)
BUN: 14 mg/dL (ref 8–23)
CO2: 28 mmol/L (ref 22–32)
Calcium: 8.3 mg/dL — ABNORMAL LOW (ref 8.9–10.3)
Chloride: 102 mmol/L (ref 98–111)
Creatinine, Ser: 0.9 mg/dL (ref 0.61–1.24)
GFR calc Af Amer: >60 mL/min (ref >60)
GFR calc non Af Amer: >60 mL/min (ref >60)
Glucose, Bld: 147 mg/dL — ABNORMAL HIGH (ref 70–99)
Potassium: 3.3 mmol/L — ABNORMAL LOW (ref 3.5–5.1)
Sodium: 139 mmol/L (ref 135–145)

## 2018-11-18 LAB — CBC
HCT: 33.8 % — ABNORMAL LOW (ref 39.0–52.0)
Hemoglobin: 11 g/dL — ABNORMAL LOW (ref 13.0–17.0)
MCH: 29.3 pg (ref 26.0–34.0)
MCHC: 32.5 g/dL (ref 30.0–36.0)
MCV: 90.1 fL (ref 80.0–100.0)
Platelets: 153 10*3/uL (ref 150–400)
RBC: 3.75 MIL/uL — ABNORMAL LOW (ref 4.22–5.81)
RDW: 14.7 % (ref 11.5–15.5)
WBC: 7.1 10*3/uL (ref 4.0–10.5)
nRBC: 0 % (ref 0.0–0.2)

## 2018-11-18 LAB — GLUCOSE, CAPILLARY
Glucose-Capillary: 127 mg/dL — ABNORMAL HIGH (ref 70–99)
Glucose-Capillary: 141 mg/dL — ABNORMAL HIGH (ref 70–99)
Glucose-Capillary: 123 mg/dL — ABNORMAL HIGH (ref 70–99)
Glucose-Capillary: 122 mg/dL — ABNORMAL HIGH (ref 70–99)

## 2018-11-18 MED ORDER — SODIUM CHLORIDE 0.9% FLUSH
10.0000 mL | Freq: Two times a day (BID) | INTRAVENOUS | Status: DC
  Administered 2018-11-19 – 2018-11-26 (×10): 10 mL

## 2018-11-18 MED ORDER — POTASSIUM CHLORIDE CRYS ER 20 MEQ PO TBCR
40.0000 meq | EXTENDED_RELEASE_TABLET | Freq: Once | ORAL | Status: AC
  Administered 2018-11-18: 40 meq via ORAL
  Filled 2018-11-18: qty 2

## 2018-11-18 MED ORDER — SODIUM CHLORIDE 0.9% FLUSH: 10.0000 mL | INTRAVENOUS | Status: DC | PRN

## 2018-11-18 NOTE — Progress Notes (Signed)
Nurse called patient son and updated him on patient status and plan of care

## 2018-11-18 NOTE — Progress Notes (Signed)
PT Cancellation Note  Patient Details Name: MONTRAIL MEHRER MRN: [id number] DOB: [birth year]   Cancelled Treatment:    Reason Eval/Treat Not Completed: Other (comment). Pt received PT evaluation for mobility (see note 11/16/18) and discharged due to no acute needs identified. Received additional order today - per RN, no acute PT needs for mobility. Question if MD's order was meant for PT Hydrotherapy; left secure chat message to clarify before discharging order.   Mabeline Caras, PT, DPT Acute Rehabilitation Services  Pager [phone] Office [place] 11/18/2018, 4:52 PM

## 2018-11-18 NOTE — Progress Notes (Signed)
Subjective: 2 Days Post-Op Procedure(s) (LRB): INCISION AND DRAINAGE LEFT FOREARM/ARM (Left) Patient reports pain as controlled but a little worse than yesterday.    Objective: Vital signs in last 24 hours: Temp:  [98.1 F (36.7 C)-98.9 F (37.2 C)] 98.3 F (36.8 C) (06/16 0817) Pulse Rate:  [72-93] 83 (06/16 0817) Resp:  [16] 16 (06/16 0416) BP: (118-128)/(47-68) 122/47 (06/16 0817) SpO2:  [98 %-100 %] 98 % (06/16 0817) FiO2 (%):  [2 %] 2 % (06/15 1725)  Intake/Output from previous day: 06/15 0701 - 06/16 0700 In: 720 [P.O.:720] Out: -  Intake/Output this shift: Total I/O In: 1580 [P.O.:480; IV Piggyback:1100] Out: -   Recent Labs    11/15/18 2000 11/16/18 0806 11/17/18 0446 11/18/18 0441  HGB 13.0 11.7* 10.8* 11.0*   Recent Labs    11/17/18 0446 11/18/18 0441  WBC 9.1 7.1  RBC 3.69* 3.75*  HCT 32.9* 33.8*  PLT 141* 153   Recent Labs    11/17/18 1333 11/18/18 0441  NA 136 139  K 3.7 3.3*  CL 103 102  CO2 25 28  BUN 13 14  CREATININE 1.12 0.90  GLUCOSE 157* 147*  CALCIUM 8.4* 8.3*   Recent Labs    11/15/18 2000  INR 1.3*    Intact sensation and capillary refill.  Dressing changed with immediate improvement of discomfort.  Erythema of forearm significantly improved.  Now looks more bruised.  Erythema on upper arm without much change.  Still warm but not hot.  No fluctuance.     Assessment/Plan: 2 Days Post-Op Procedure(s) (LRB): INCISION AND DRAINAGE LEFT FOREARM/ARM (Left) Afebrile and normal WBC still.  Overall forearm much improved with lingering erythema of upper arm.  Cultures still negative.  Will start hydrotherapy tomorrow.  Continue antibiotics.  If no improvement in upper arm erythema tomorrow will consider further incision and drainage for Thursday.     [name] 11/18/2018, 4:40 PM

## 2018-11-18 NOTE — Progress Notes (Signed)
Pt updated son and will let RN know if he wants RN to call son later for update.

## 2018-11-18 NOTE — Significant Event (Signed)
For 6/16 at 1900 to 6/17 at 0700, please use pager [phone].  Pager [phone] is not working.  [place] Constable, D.O.  PGY-1 Family Medicine  11/18/2018 7:46 PM

## 2018-11-18 NOTE — Progress Notes (Signed)
Family Medicine Teaching Service Daily Progress Note Intern Pager: [phone]  Patient name: KEIGEN CADDELL Medical record number: [id number] Date of birth: [birth year] Age: 85 y.o. Gender: male  Primary Care Provider: Nicoletta Dress, MD Consultants: none Code Status: Full  Pt Overview and Major Events to Date:  6/13 - admitted with L arm cellulitis 6/14 - I+D of Left forearm abscess  Assessment and Plan: LAVARIUS DOUGHTEN is a 85 y.o. male presenting with L arm redness. PMH is significant for DM, Afib on pradaxa, aortic stenosis s/p TAVR, PVD, CAD s/p CABG, HTN, COPD, HLD, HFpEF, GERD.  L arm cellulitis. S/p incision and drainage in the OR on 4/08 without complication.  He is remained afebrile with normal vitals since his incision and drainage.  He is remained on IV antibiotics since his procedure.  We will plan to continue IV antibiotics for now until physical exam shows significant improvement compared to admission. -Blood cultures show no growth to date on 6/15 -DC Ancef (6/13-6/14) -Vanc (6/14- ) -Unasyn (6/14 - ) -morphine q2prn  -Follow-up blood cultures -OT - outpatient follow up  A Fib.   Stable, rate controlled -holding Pradaxa 150 mg twice daily -metoprolol 50 mg twice daily  Diabetes.  Last A1c viewable is 6.1 on 09/13/2017.  -holding metformin 500 mg daily -Monitor CBGs in the setting of acute infection  PVD, CAD s/p CABG, HLD -Continue home atorvastatin   GERD -Pantoprazole 40 mg daily  HFpEF, HTN  Stable - Lasix 40 mg daily - terazosin 5 mg daily - metoprolol as per above  FEN/GI: Heart healthy carb modified Prophylaxis: Pradaxa   Disposition: 1-2 additional days of hospitalization anticipated prior to discharge  Subjective:  No acute events overnight.  No new complaints this morning.  He reports that he has not noted any significant improvement in his left arm.  He thinks that the erythema of his left arm may be worsening in some spots and  improving others.  He noted that his left arm is become increasingly uncomfortable that he may have a little bit more trouble moving his fingers today compared to yesterday.  Objective: Temp:  [97.8 F (36.6 C)-98.9 F (37.2 C)] 98.6 F (37 C) (06/16 0416) Pulse Rate:  [72-170] 80 (06/16 0416) Resp:  [16-18] 16 (06/16 0416) BP: (118-134)/(56-68) 128/62 (06/16 0416) SpO2:  [96 %-100 %] 100 % (06/16 0416) FiO2 (%):  [2 %] 2 % (06/15 1725) Physical Exam: General: Alert and cooperative and appears to be in no acute distress.  Resting in bed comfortably. HEENT: Appreciable JVD Cardio: Normal S1 and S2, no S3 or S4. Rhythm is regular. No murmurs or rubs.   Pulm: Clear to auscultation bilaterally, no crackles, wheezing, or diminished breath sounds.  3 L nasal cannula. Abdomen: Bowel sounds normal. Abdomen soft and non-tender.  Extremities: No peripheral edema. Warm/ well perfused.  Strong radial pulse. Neuro: Cranial nerves grossly intact    Laboratory: Recent Labs  Lab 11/16/18 0806 11/17/18 0446 11/18/18 0441  WBC 9.8 9.1 7.1  HGB 11.7* 10.8* 11.0*  HCT 36.2* 32.9* 33.8*  PLT 150 141* 153   Recent Labs  Lab 11/15/18 2000 11/16/18 0806 11/17/18 1333 11/18/18 0441  NA 134* 137 136 139  K 4.1 3.9 3.7 3.3*  CL 100 102 103 102  CO2 25 26 25 28   BUN 15 12 13 14   CREATININE 1.04 0.90 1.12 0.90  CALCIUM 8.9 8.5* 8.4* 8.3*  PROT 6.0*  --   --   --  BILITOT 1.1  --   --   --   ALKPHOS 101  --   --   --   ALT 28  --   --   --   AST 23  --   --   --   GLUCOSE 220* 135* 157* 147*    Imaging/Diagnostic Tests: No results found.  Matilde Haymaker, MD 11/18/2018, 6:08 AM PGY-1, [place] Intern pager: [phone], text pages welcome

## 2018-11-18 NOTE — Progress Notes (Signed)
RN called Dr. Levell July office to clarify PT orders if it was for an eval and treat or for hydrotherapy. Nurse at office stated it would be for hydrotherapy. PT informed of such but order will need to be changed. Sent Dr. Fredna Dow a secure message chat.

## 2018-11-19 LAB — BASIC METABOLIC PANEL
Anion gap: 11 (ref 5–15)
BUN: 14 mg/dL (ref 8–23)
CO2: 24 mmol/L (ref 22–32)
Calcium: 8.2 mg/dL — ABNORMAL LOW (ref 8.9–10.3)
Chloride: 104 mmol/L (ref 98–111)
Creatinine, Ser: 1.04 mg/dL (ref 0.61–1.24)
GFR calc Af Amer: >60 mL/min (ref >60)
GFR calc non Af Amer: >60 mL/min (ref >60)
Glucose, Bld: 159 mg/dL — ABNORMAL HIGH (ref 70–99)
Potassium: 3.6 mmol/L (ref 3.5–5.1)
Sodium: 139 mmol/L (ref 135–145)

## 2018-11-19 LAB — CBC
HCT: 34.2 % — ABNORMAL LOW (ref 39.0–52.0)
Hemoglobin: 10.9 g/dL — ABNORMAL LOW (ref 13.0–17.0)
MCH: 29.4 pg (ref 26.0–34.0)
MCHC: 31.9 g/dL (ref 30.0–36.0)
MCV: 92.2 fL (ref 80.0–100.0)
Platelets: 163 10*3/uL (ref 150–400)
RBC: 3.71 MIL/uL — ABNORMAL LOW (ref 4.22–5.81)
RDW: 14.8 % (ref 11.5–15.5)
WBC: 5.4 10*3/uL (ref 4.0–10.5)
nRBC: 0 % (ref 0.0–0.2)

## 2018-11-19 LAB — GLUCOSE, CAPILLARY
Glucose-Capillary: 136 mg/dL — ABNORMAL HIGH (ref 70–99)
Glucose-Capillary: 135 mg/dL — ABNORMAL HIGH (ref 70–99)
Glucose-Capillary: 159 mg/dL — ABNORMAL HIGH (ref 70–99)
Glucose-Capillary: 160 mg/dL — ABNORMAL HIGH (ref 70–99)

## 2018-11-19 MED ORDER — MORPHINE SULFATE (PF) 2 MG/ML IV SOLN
2.0000 mg | INTRAVENOUS | Status: DC | PRN
  Administered 2018-11-19 – 2018-11-26 (×24): 2 mg via INTRAVENOUS
  Filled 2018-11-19 (×26): qty 1

## 2018-11-19 MED ORDER — ALBUTEROL SULFATE (2.5 MG/3ML) 0.083% IN NEBU
2.5000 mg | INHALATION_SOLUTION | Freq: Two times a day (BID) | RESPIRATORY_TRACT | Status: DC
  Administered 2018-11-19 – 2018-11-22 (×5): 2.5 mg via RESPIRATORY_TRACT
  Filled 2018-11-19 (×7): qty 3

## 2018-11-19 MED ORDER — ALBUTEROL SULFATE (2.5 MG/3ML) 0.083% IN NEBU: 2.5000 mg | INHALATION_SOLUTION | Freq: Two times a day (BID) | RESPIRATORY_TRACT | Status: DC

## 2018-11-19 MED ORDER — ACETAMINOPHEN 325 MG PO TABS
650.0000 mg | ORAL_TABLET | Freq: Four times a day (QID) | ORAL | Status: DC
  Administered 2018-11-19 – 2018-11-26 (×27): 650 mg via ORAL
  Filled 2018-11-19 (×27): qty 2

## 2018-11-19 MED ORDER — MORPHINE SULFATE (PF) 4 MG/ML IV SOLN
4.0000 mg | Freq: Once | INTRAVENOUS | Status: AC
  Administered 2018-11-19: 4 mg via INTRAVENOUS
  Filled 2018-11-19: qty 1

## 2018-11-19 MED ORDER — OXYCODONE HCL 5 MG PO TABS
5.0000 mg | ORAL_TABLET | Freq: Four times a day (QID) | ORAL | Status: DC
  Administered 2018-11-19 – 2018-11-21 (×8): 5 mg via ORAL
  Filled 2018-11-19 (×9): qty 1

## 2018-11-19 NOTE — Progress Notes (Signed)
Family Medicine Teaching Service Daily Progress Note Intern Pager: [phone]  Patient name: [name] Medical record number: [id number] Date of birth: [birth year] Age: 85 y.o. Gender: male  Primary Care Provider: Nicoletta Dress, MD Consultants: none Code Status: Full  Pt Overview and Major Events to Date:  6/13 - admitted with L arm cellulitis 6/14 - I+D of Left forearm abscess  Assessment and Plan: [name] is a 85 y.o. male presenting with L arm redness. PMH is significant for DM, Afib on pradaxa, aortic stenosis s/p TAVR, PVD, CAD s/p CABG, HTN, COPD, HLD, HFpEF, GERD.  L arm cellulitis. S/p incision and drainage in the OR on 5/70 without complication.  He is remained afebrile with normal vitals since incision and drainage.  Blood culture and wound cultures showed no growth to date.  Orthopedics assessed left arm wound on 6/16 and recommended continued IV antibiotics.  Orthopedics following, appreciate recommendations -DC Ancef (6/13-6/14) -DC vanc (6/14-6/16 due to concern for potential kidney injury -Unasyn (6/14 - ) -Tylenol 650 every 6 hours scheduled -Oxycodone 5 mg every 6 hours scheduled -morphine q2prn every 2 hours as needed -OT - outpatient follow up  A Fib.   Stable, rate controlled.  Holding home Pradaxa.  Plan to convert to Eliquis. -Start Eliquis once there is evidence of significant improvement left arm hematoma/cellulitis -metoprolol 50 mg twice daily  Diabetes.  Last A1c viewable is 6.1 on 09/13/2017.  -holding metformin 500 mg daily -Monitor CBGs in the setting of acute infection  PVD, CAD s/p CABG, HLD -Continue home atorvastatin   GERD -Pantoprazole 40 mg daily  HFpEF, HTN  Stable - Lasix 40 mg daily - terazosin 5 mg daily - metoprolol as per above  FEN/GI: Heart healthy carb modified Prophylaxis: Heparin  Disposition: Stable discharge on 6/18.  Subjective:  No acute events overnight.  No new complaints this morning.  He  reports no significant improvement in his left forearm although he was grateful to have it assessed yesterday with surgery and rewrapped.  He reports that the redness of his left arm does not appear to have changed very much.  Objective: Temp:  [98.2 F (36.8 C)-99.4 F (37.4 C)] 99.4 F (37.4 C) (06/17 0314) Pulse Rate:  [82-125] 125 (06/17 0314) Resp:  [16-17] 16 (06/17 0314) BP: (113-162)/(40-67) 162/67 (06/17 0314) SpO2:  [97 %-100 %] 97 % (06/17 0314) Physical Exam: General: Alert and cooperative and appears to be in no acute distress.  Resting in bed comfortably Cardio: Normal S1 and S2, no S3 or S4. Rhythm is irregularly irregular. No murmurs or rubs.   Pulm: Clear to auscultation bilaterally, no crackles, wheezing, or diminished breath sounds. Normal respiratory effort Abdomen: Bowel sounds normal. Abdomen soft and non-tender.  Extremities: No peripheral edema. Warm/ well perfused.  Strong radial pulse. Neuro: Cranial nerves grossly intact  Laboratory: Recent Labs  Lab 11/17/18 0446 11/18/18 0441 11/19/18 0353  WBC 9.1 7.1 5.4  HGB 10.8* 11.0* 10.9*  HCT 32.9* 33.8* 34.2*  PLT 141* 153 163   Recent Labs  Lab 11/15/18 2000  11/17/18 1333 11/18/18 0441 11/19/18 0353  NA 134*   < > 136 139 139  K 4.1   < > 3.7 3.3* 3.6  CL 100   < > 103 102 104  CO2 25   < > 25 28 24   BUN 15   < > 13 14 14   CREATININE 1.04   < > 1.12 0.90 1.04  CALCIUM 8.9   < >  8.4* 8.3* 8.2*  PROT 6.0*  --   --   --   --   BILITOT 1.1  --   --   --   --   ALKPHOS 101  --   --   --   --   ALT 28  --   --   --   --   AST 23  --   --   --   --   GLUCOSE 220*   < > 157* 147* 159*   < > = values in this interval not displayed.    Imaging/Diagnostic Tests: No results found.  Matilde Haymaker, MD 11/19/2018, 5:53 AM PGY-1, [place] Intern pager: [phone], text pages welcome

## 2018-11-19 NOTE — Plan of Care (Signed)

## 2018-11-19 NOTE — Care Management Important Message (Signed)
Important Message  Patient Details  Name: [name] MRN: [id number] Date of Birth: 04-12-1932   Medicare Important Message Given:  Yes    Memory Argue 11/19/2018, 1:51 PM

## 2018-11-19 NOTE — Progress Notes (Signed)
Physical Therapy Wound Treatment Patient Details  Name: [name] MRN: [id number] Date of Birth: Aug 15, 1931  Today's Date: 11/19/2018 Time: 6144-3154 Time Calculation (min): 89 min  Subjective  Patient and Family Stated Goals: Healed up so I can use it. Prior Treatments: unknown  Pain Score:    Wound Assessment  Wound / Incision (Open or Dehisced) 11/19/18 Other (Comment) Arm Left left dorsal cellulitic wound post I and D (Active)  Wound Image   11/19/18 1654  Dressing Type Compression wrap;Gauze (Comment);Impregnated gauze (bismuth);Moist to dry 11/19/18 1654  Dressing Changed New 11/19/18 1654  Dressing Status Clean;Dry;Intact 11/19/18 1654  Dressing Change Frequency Daily 11/19/18 1654  Site / Wound Assessment Purple;Pink 11/19/18 1654  % Wound base Red or Granulating 70% 11/19/18 1654  % Wound base Black/Eschar 30% 11/19/18 1654  Peri-wound Assessment Erythema (blanchable);Edema;Purple;Pink 11/19/18 1654  Wound Length (cm) 3 cm 11/19/18 1654  Wound Width (cm) 3 cm 11/19/18 1654  Wound Depth (cm) 0.3 cm 11/19/18 1654  Wound Volume (cm^3) 2.7 cm^3 11/19/18 1654  Wound Surface Area (cm^2) 9 cm^2 11/19/18 1654  Undermining (cm) 12*  2 cm 11/19/18 1654  Margins Unattached edges (unapproximated) 11/19/18 1654  Drainage Amount Minimal 11/19/18 1654  Drainage Description Serosanguineous 11/19/18 1654  Treatment Cleansed;Debridement (Selective);Hydrotherapy (Pulse lavage);Packing (Saline gauze) 11/19/18 1654     Wound / Incision (Open or Dehisced) 11/19/18 Other (Comment) Arm Left ventral forearm wound post I and D (Active)  Wound Image   11/19/18 1654  Dressing Type Compression wrap;Gauze (Comment);Impregnated gauze (bismuth);Moist to dry 11/19/18 1654  Dressing Changed New 11/19/18 1654  Dressing Status Clean;Intact 11/19/18 1654  Dressing Change Frequency Daily 11/19/18 1654  Site / Wound Assessment Clean;Dry;Painful;Red 11/19/18 1654  % Wound base Red or Granulating 40%  11/19/18 1654  % Wound base Yellow/Fibrinous Exudate 25% 11/19/18 1654  % Wound base Other/Granulation Tissue (Comment) 35% 11/19/18 1654  Peri-wound Assessment Other (Comment) 11/19/18 1654  Wound Length (cm) 21 cm 11/19/18 1654  Wound Width (cm) 4 cm 11/19/18 1654  Wound Depth (cm) 1 cm 11/19/18 1654  Wound Volume (cm^3) 84 cm^3 11/19/18 1654  Wound Surface Area (cm^2) 84 cm^2 11/19/18 1654  Tunneling (cm) 4  @ 6 * 11/19/18 1654  Drainage Amount Moderate 11/19/18 1654  Drainage Description Serosanguineous 11/19/18 1654  Treatment Cleansed;Debridement (Selective);Hydrotherapy (Pulse lavage);Packing (Saline gauze) 11/19/18 1654      Hydrotherapy Pulsed lavage therapy - wound location: l forearm dorsum and ventral surfaces due to cellulitis from striking the back of his arm.   Pulsed Lavage with Suction (psi): 4 psi Pulsed Lavage with Suction - Normal Saline Used: 1000 mL Pulsed Lavage Tip: Tip with splash shield Selective Debridement Selective Debridement - Location: L forearm Selective Debridement - Tools Used: Forceps;Scissors;Scalpel Selective Debridement - Tissue Removed: Necrotic trash and other tissues.   Wound Assessment and Plan  Wound Therapy - Assess/Plan/Recommendations Wound Therapy - Clinical Statement: This wound can benefit from PLS to decrease bacterial load and soften up some tissues for selective debriding. Wound Therapy - Functional Problem List: stiff sore L finger/hand movement Factors Delaying/Impairing Wound Healing: Infection - systemic/local Hydrotherapy Plan: Debridement;Dressing change;Patient/family education;Pulsatile lavage with suction Wound Therapy - Frequency: 6X / week Wound Therapy - Current Recommendations: Case manager/social work Wound Therapy - Follow Up Recommendations: Home health RN Wound Plan: see above  Wound Therapy Goals- Improve the function of patient's integumentary system by progressing the wound(s) through the phases of wound  healing (inflammation - proliferation - remodeling) by: Decrease Necrotic  Tissue to: 5 Decrease Necrotic Tissue - Progress: Goal set today Increase Granulation Tissue to: 95 including any healthy tissues Increase Granulation Tissue - Progress: Goal set today Goals/treatment plan/discharge plan were made with and agreed upon by patient/family: Yes Time For Goal Achievement: 7 days Wound Therapy - Potential for Goals: Good  Goals will be updated until maximal potential achieved or discharge criteria met.  Discharge criteria: when goals achieved, discharge from hospital, MD decision/surgical intervention, no progress towards goals, refusal/missing three consecutive treatments without notification or medical reason.  GP     [name] [name] 11/19/2018, 5:12 PM  11/19/2018  [name], [place] [phone]  (pager) [phone]  (office)

## 2018-11-19 NOTE — Progress Notes (Signed)
For 6/17 0700 to 1900, please page [phone].  Pager [phone] is not working. Brad Thompson, MD, MS FAMILY MEDICINE RESIDENT - PGY2 11/19/2018 2:15 PM 

## 2018-11-19 NOTE — Significant Event (Signed)
For 6/17 at 1900 to 6/18 at 0700, please page [phone].  Pager [phone] is not working.  [place] Constable, D.O.  PGY-1 Family Medicine  11/19/2018 7:33 PM

## 2018-11-19 NOTE — Progress Notes (Addendum)
Pharmacy Antibiotic Note  [name] is a 85 y.o. male admitted on 11/15/2018 with cellulitis of LUE. Initially treated with Ancef. Pharmacy has been consulted for Unasyn dosing. Today is abx day 5. Pt is afebrile, WBC WNL, & erythema is improving but still present.   Plan: Continue Unasyn 3g IV every 6 hours.  Monitor C&S, renal fxn. F/U switch to oral abx, length of treatment   Height: 6' (182.9 cm) Weight: 200 lb (90.7 kg) IBW/kg (Calculated) : 77.6  Temp (24hrs), Avg:98.8 F (37.1 C), Min:98.2 F (36.8 C), Max:99.4 F (37.4 C)  Recent Labs  Lab 11/15/18 2000 11/16/18 0050 11/16/18 0806 11/17/18 0446 11/17/18 1333 11/18/18 0441 11/19/18 0353  WBC 12.1*  --  9.8 9.1  --  7.1 5.4  CREATININE 1.04  --  0.90  --  1.12 0.90 1.04  LATICACIDVEN 1.7 1.3  --   --   --   --   --     Estimated Creatinine Clearance: 57 mL/min (by C-G formula based on SCr of 1.04 mg/dL).    Allergies  Allergen Reactions  . Bactrim [Sulfamethoxazole-Trimethoprim] Other (See Comments)    "does not do anything for me"  . Levaquin [Levofloxacin In D5w] Diarrhea    Antimicrobials this admission: 6/13 Ancef >> 6/14 6/14 Vancomycin  >> 6/16 6/14 Unasyn >>  Dose adjustments this admission:  Microbiology results: 6/13 BCx: ngtd 3d 6/13 Covid: neg 6/14 tissue left arm >> ngtd 2d  Thank you for allowing pharmacy to be a part of this patient's care.  Ladoris Gene PharmD candidate 11/19/2018 9:06 AM   I discussed / reviewed the pharmacy note by Mrs. Stinson and I agree with the student's findings and plans as documented.  [employer], Pharm.D., BCPS Clinical Pharmacist 11/19/2018 1:14 PM

## 2018-11-19 NOTE — Progress Notes (Signed)
Subjective: 3 Days Post-Op Procedure(s) (LRB): INCISION AND DRAINAGE LEFT FOREARM/ARM (Left) Patient reports pain as improved since yesterday.    Objective: Vital signs in last 24 hours: Temp:  [98.2 F (36.8 C)-99.4 F (37.4 C)] 98.6 F (37 C) (06/17 1531) Pulse Rate:  [82-125] 82 (06/17 1531) Resp:  [16-18] 18 (06/17 1531) BP: (113-162)/(40-67) 129/40 (06/17 1531) SpO2:  [97 %-100 %] 99 % (06/17 1531)  Intake/Output from previous day: 06/16 0701 - 06/17 0700 In: 2300 [P.O.:1200; IV Piggyback:1100] Out: -  Intake/Output this shift: Total I/O In: 480 [P.O.:480] Out: -   Recent Labs    11/17/18 0446 11/18/18 0441 11/19/18 0353  HGB 10.8* 11.0* 10.9*   Recent Labs    11/18/18 0441 11/19/18 0353  WBC 7.1 5.4  RBC 3.75* 3.71*  HCT 33.8* 34.2*  PLT 153 163   Recent Labs    11/18/18 0441 11/19/18 0353  NA 139 139  K 3.3* 3.6  CL 102 104  CO2 28 24  BUN 14 14  CREATININE 0.90 1.04  GLUCOSE 147* 159*  CALCIUM 8.3* 8.2*   No results for input(s): LABPT, INR in the last 72 hours.  Intact motion in fingers.  Dressing c/d/i.  Erythema on volar side of upper arm resolved.  Erythema at medial elbow resolved.  Full feeling area on medial elbow/upper arm has gone and now arm is soft.  Some erythema remains on posterior aspect upper arm.     Assessment/Plan: 3 Days Post-Op Procedure(s) (LRB): INCISION AND DRAINAGE LEFT FOREARM/ARM (Left) Improvement in erythema since yesterday with some remaining posterior arm which is the dependent area.  Afebrile and normal WBC.  Will recheck tomorrow, but hopefully will see continued improvement and not need further incision and drainage.    [name] 11/19/2018, 3:37 PM

## 2018-11-20 ENCOUNTER — Ambulatory Visit (HOSPITAL_COMMUNITY): Payer: Medicare Other

## 2018-11-20 ENCOUNTER — Inpatient Hospital Stay (HOSPITAL_COMMUNITY): Payer: Medicare Other

## 2018-11-20 DIAGNOSIS — R0602 Shortness of breath: Secondary | ICD-10-CM

## 2018-11-20 LAB — CULTURE, BLOOD (ROUTINE X 2)
Culture: NO GROWTH
Culture: NO GROWTH
Special Requests: ADEQUATE

## 2018-11-20 LAB — CBC
HCT: 31.6 % — ABNORMAL LOW (ref 39.0–52.0)
Hemoglobin: 10 g/dL — ABNORMAL LOW (ref 13.0–17.0)
MCH: 29 pg (ref 26.0–34.0)
MCHC: 31.6 g/dL (ref 30.0–36.0)
MCV: 91.6 fL (ref 80.0–100.0)
Platelets: 144 10*3/uL — ABNORMAL LOW (ref 150–400)
RBC: 3.45 MIL/uL — ABNORMAL LOW (ref 4.22–5.81)
RDW: 14.6 % (ref 11.5–15.5)
WBC: 5.6 10*3/uL (ref 4.0–10.5)
nRBC: 0 % (ref 0.0–0.2)

## 2018-11-20 LAB — GLUCOSE, CAPILLARY
Glucose-Capillary: 133 mg/dL — ABNORMAL HIGH (ref 70–99)
Glucose-Capillary: 163 mg/dL — ABNORMAL HIGH (ref 70–99)
Glucose-Capillary: 145 mg/dL — ABNORMAL HIGH (ref 70–99)
Glucose-Capillary: 151 mg/dL — ABNORMAL HIGH (ref 70–99)

## 2018-11-20 MED ORDER — AMOXICILLIN-POT CLAVULANATE 875-125 MG PO TABS
1.0000 | ORAL_TABLET | Freq: Two times a day (BID) | ORAL | Status: DC
  Administered 2018-11-20 – 2018-11-26 (×13): 1 via ORAL
  Filled 2018-11-20 (×13): qty 1

## 2018-11-20 MED ORDER — HYDROMORPHONE HCL 1 MG/ML IJ SOLN
1.0000 mg | Freq: Once | INTRAMUSCULAR | Status: AC
  Administered 2018-11-20: 1 mg via INTRAVENOUS
  Filled 2018-11-20: qty 1

## 2018-11-20 MED ORDER — FUROSEMIDE 10 MG/ML IJ SOLN
20.0000 mg | Freq: Once | INTRAMUSCULAR | Status: AC
  Administered 2018-11-20: 20 mg via INTRAVENOUS
  Filled 2018-11-20: qty 2

## 2018-11-20 MED ORDER — FUROSEMIDE 10 MG/ML IJ SOLN: 40.0000 mg | Freq: Once | INTRAMUSCULAR | Status: DC

## 2018-11-20 MED ORDER — DABIGATRAN ETEXILATE MESYLATE 150 MG PO CAPS
150.0000 mg | ORAL_CAPSULE | Freq: Two times a day (BID) | ORAL | Status: DC
  Administered 2018-11-20 – 2018-11-23 (×7): 150 mg via ORAL
  Filled 2018-11-20 (×9): qty 1

## 2018-11-20 NOTE — Progress Notes (Signed)
Family Medicine Teaching Service Daily Progress Note Intern Pager: [phone]  Patient name: [name] Medical record number: [id number] Date of birth: [birth year] Age: 85 y.o. Gender: male  Primary Care Provider: Nicoletta Dress, MD Consultants: none Code Status: Full  Pt Overview and Major Events to Date:  6/13 - admitted with L arm cellulitis 6/14 - I+D of Left forearm abscess 6/17 - hydrotherapy  Assessment and Plan: [name] is a 85 y.o. male presenting with L arm redness. PMH is significant for DM, Afib on pradaxa, aortic stenosis s/p TAVR, PVD, CAD s/p CABG, HTN, COPD, HLD, HFpEF, GERD.  L arm cellulitis. S/p incision and drainage in the OR on 7/94 without complication.  Afebrile with normal vitals overnight.  Hydrotherapy on 6/17.  Ortho notes improvement since last wound check, is optimistic that he will not require further incision and drainage. -DC Ancef (6/13-6/14) -DC vanc (6/14-6/16 due to concern for potential kidney injury -Unasyn (6/14 - ), transition to p.o. Augmentin today -Tylenol 650 every 6 hours scheduled -Oxycodone 5 mg every 6 hours scheduled -morphine 2 mg every 4 hours as needed -Additional IV morphine for wound care (at least 4 mg) -OT - outpatient follow up  Number overload On exam this morning, he is clearly short of breath slightly increased flow of his nasal cannula up to 4 L.  He also has crackles on auscultation, significant 3+ pitting edema in the lower extremities and jugular venous distention to the angle of his jaw.  Chart review shows that he is roughly 4 L net positive from his hospitalization so far likely secondary to continued IV antibiotics.  We will cautiously diurese due to low diastolic pressures. -Chest x-ray -Lasix IV 40 mg x 1 -Strict I's and O's -Daily weights  A Fib.   Stable, rate controlled.  Holding home Pradaxa.  Plan to convert to Eliquis. -Start Eliquis once there is evidence of significant improvement left  arm hematoma/cellulitis -metoprolol 50 mg twice daily  Diabetes.  Last A1c viewable is 6.1 on 09/13/2017.  -holding metformin 500 mg daily -Monitor CBGs in the setting of acute infection  PVD, CAD s/p CABG, HLD -Continue home atorvastatin   GERD -Pantoprazole 40 mg daily  HFpEF, HTN  Stable - Lasix 40 mg daily - terazosin 5 mg daily - metoprolol as per above  COPD -Incruse daily -Duo nebs in a.m. and p.m.  Anemia, iron deficiency -Iron supplements  Allergies -Loratadine 10 mg daily  FEN/GI: Heart healthy carb modified Prophylaxis: Heparin  Disposition: Stable discharge on 6/18.  Subjective:  No acute events overnight.  He reports he is significantly short of breath this morning and thought that it is likely due to being late receiving his breathing treatment.  He continues to have significant pain in his left arm but does not appear to be worsening or significantly changing.  Objective: Temp:  [98.5 F (36.9 C)-99.1 F (37.3 C)] 99.1 F (37.3 C) (06/18 0433) Pulse Rate:  [81-109] 81 (06/18 0433) Resp:  [16-18] 18 (06/18 0433) BP: (129-144)/(40-77) 135/60 (06/18 0433) SpO2:  [90 %-100 %] 100 % (06/18 0433) Physical Exam: General: Alert and cooperative.  Resting in bed with mild respiratory distress. HEENT: JVD to angle of jaw. Cardio: Normal S1 and S2, no S3 or S4.  Irregularly irregular rhythm.  Systolic murmur. Pulm: Rales noted in lower fields bilaterally.  No wheezing noted on exam.  Breathing with mild respiratory distress on 4 L nasal cannula. Abdomen: Bowel sounds normal. Abdomen soft and  non-tender.  Extremities: 3+ LE edema. Warm/ well perfused.  Strong radial pulse. Neuro: Cranial nerves grossly intact   Laboratory: Recent Labs  Lab 11/17/18 0446 11/18/18 0441 11/19/18 0353  WBC 9.1 7.1 5.4  HGB 10.8* 11.0* 10.9*  HCT 32.9* 33.8* 34.2*  PLT 141* 153 163   Recent Labs  Lab 11/15/18 2000  11/17/18 1333 11/18/18 0441 11/19/18 0353  NA  134*   < > 136 139 139  K 4.1   < > 3.7 3.3* 3.6  CL 100   < > 103 102 104  CO2 25   < > 25 28 24   BUN 15   < > 13 14 14   CREATININE 1.04   < > 1.12 0.90 1.04  CALCIUM 8.9   < > 8.4* 8.3* 8.2*  PROT 6.0*  --   --   --   --   BILITOT 1.1  --   --   --   --   ALKPHOS 101  --   --   --   --   ALT 28  --   --   --   --   AST 23  --   --   --   --   GLUCOSE 220*   < > 157* 147* 159*   < > = values in this interval not displayed.    Imaging/Diagnostic Tests: No results found.  Matilde Haymaker, MD 11/20/2018, 5:55 AM PGY-1, [place] Intern pager: [phone], text pages welcome

## 2018-11-20 NOTE — Significant Event (Signed)
For 6/18 1900 to 6/19 0700, please page [phone].  Pager [phone] is not working.  [place] Constable, D.O.  PGY-1 Family Medicine  11/20/2018 8:11 PM

## 2018-11-20 NOTE — Progress Notes (Signed)
Subjective: 4 Days Post-Op Procedure(s) (LRB): INCISION AND DRAINAGE LEFT FOREARM/ARM (Left) Patient reports pain as improved overall.  Does not feel ill.  Seen by hydrotherapy today.  Objective: Vital signs in last 24 hours: Temp:  [98.2 F (36.8 C)-99.1 F (37.3 C)] 98.2 F (36.8 C) (06/18 0737) Pulse Rate:  [63-109] 63 (06/18 0737) Resp:  [16-19] 19 (06/18 0737) BP: (129-138)/(40-77) 130/58 (06/18 0737) SpO2:  [90 %-100 %] 97 % (06/18 0850)  Intake/Output from previous day: 06/17 0701 - 06/18 0700 In: 720 [P.O.:720] Out: 700 [Urine:700] Intake/Output this shift: Total I/O In: 240 [P.O.:240] Out: 0   Recent Labs    11/18/18 0441 11/19/18 0353 11/20/18 1214  HGB 11.0* 10.9* 10.0*   Recent Labs    11/19/18 0353 11/20/18 1214  WBC 5.4 5.6  RBC 3.71* 3.45*  HCT 34.2* 31.6*  PLT 163 144*   Recent Labs    11/18/18 0441 11/19/18 0353  NA 139 139  K 3.3* 3.6  CL 102 104  CO2 28 24  BUN 14 14  CREATININE 0.90 1.04  GLUCOSE 147* 159*  CALCIUM 8.3* 8.2*   No results for input(s): LABPT, INR in the last 72 hours.  Erythema significantly decreased today.  Resolved on medial side of upper arm elbow.  Small amount of rubor posterior arm, but less than previously and non tender.  Forearm erythema resolved.   Assessment/Plan: 4 Days Post-Op Procedure(s) (LRB): INCISION AND DRAINAGE LEFT FOREARM/ARM (Left) Afebrile.  WBC 5.6 today.  Feels well.  Erythema significantly improved.  Do not think we need further incision and drainage.  Okay for d/c from hand standpoint when okay with primary team.  Will follow up in office early next week.     Leanora Cover 11/20/2018, 1:41 PM

## 2018-11-20 NOTE — Plan of Care (Signed)

## 2018-11-20 NOTE — Progress Notes (Signed)
Bladder scanned pt, 815cc. I&O cath pt 800cc. Provider notified.

## 2018-11-20 NOTE — Progress Notes (Signed)
Physical Therapy Wound Treatment Patient Details  Name: EVERADO PILLSBURY MRN: [id number] Date of Birth: 02/13/1932  Today's Date: 11/20/2018 Time: 6767-2094 Time Calculation (min): 44 min  Subjective  Subjective: As long as you knock me out... Patient and Family Stated Goals: Healed up so I can use it. Prior Treatments: unknown  Pain Score:  8/10  Wound Assessment  Wound / Incision (Open or Dehisced) 11/19/18 Other (Comment) Arm Left left dorsal cellulitic wound post I and D (Active)  Wound Image   11/19/18 1654  Dressing Type Compression wrap;Gauze (Comment);Impregnated gauze (bismuth);Moist to dry 11/20/18 1346  Dressing Changed New 11/19/18 1654  Dressing Status Clean;Dry;Intact 11/20/18 1346  Dressing Change Frequency Daily 11/20/18 1346  Site / Wound Assessment Purple;Pink 11/20/18 1346  % Wound base Red or Granulating 85% 11/20/18 1346  % Wound base Yellow/Fibrinous Exudate 15% 11/20/18 1346  % Wound base Black/Eschar 0% 11/20/18 1346  Peri-wound Assessment Erythema (blanchable);Edema;Purple;Pink 11/20/18 1346  Wound Length (cm) 3 cm 11/19/18 1654  Wound Width (cm) 3 cm 11/19/18 1654  Wound Depth (cm) 0.3 cm 11/19/18 1654  Wound Volume (cm^3) 2.7 cm^3 11/19/18 1654  Wound Surface Area (cm^2) 9 cm^2 11/19/18 1654  Undermining (cm) 12*  2 cm 11/19/18 1654  Margins Unattached edges (unapproximated) 11/20/18 1346  Drainage Amount Minimal 11/20/18 1346  Drainage Description Serosanguineous 11/20/18 1346  Treatment Cleansed;Debridement (Selective);Hydrotherapy (Pulse lavage);Packing (Saline gauze);Other (Comment) 11/20/18 1346     Wound / Incision (Open or Dehisced) 11/19/18 Other (Comment) Arm Left ventral forearm wound post I and D (Active)  Wound Image   11/19/18 1654  Dressing Type Compression wrap;Gauze (Comment);Impregnated gauze (bismuth);Moist to dry 11/20/18 1346  Dressing Changed Changed 11/20/18 1346  Dressing Status Clean;Intact 11/20/18 1346  Dressing Change  Frequency Daily 11/20/18 1346  Site / Wound Assessment Clean;Dry;Painful;Red 11/20/18 1346  % Wound base Red or Granulating 50% 11/20/18 1346  % Wound base Yellow/Fibrinous Exudate 15% 11/20/18 1346  % Wound base Other/Granulation Tissue (Comment) 35% 11/20/18 1346  Peri-wound Assessment Other (Comment);Edema;Erythema (blanchable);Purple 11/20/18 1346  Wound Length (cm) 21 cm 11/19/18 1654  Wound Width (cm) 4 cm 11/19/18 1654  Wound Depth (cm) 1 cm 11/19/18 1654  Wound Volume (cm^3) 84 cm^3 11/19/18 1654  Wound Surface Area (cm^2) 84 cm^2 11/19/18 1654  Tunneling (cm) 4  @ 6 * 11/19/18 1654  Drainage Amount Minimal 11/20/18 1346  Drainage Description Serosanguineous 11/20/18 1346  Treatment Cleansed;Debridement (Selective);Hydrotherapy (Pulse lavage);Packing (Saline gauze);Other (Comment) 11/20/18 1346      Hydrotherapy Pulsed lavage therapy - wound location: l forearm dorsum and ventral surfaces due to cellulitis from striking the back of his arm.   Pulsed Lavage with Suction (psi): 4 psi Pulsed Lavage with Suction - Normal Saline Used: 1000 mL Pulsed Lavage Tip: Tip with splash shield Selective Debridement Selective Debridement - Location: L forearm Selective Debridement - Tools Used: Forceps;Scissors;Scalpel Selective Debridement - Tissue Removed: Necrotic trash and other tissues.   Wound Assessment and Plan  Wound Therapy - Assess/Plan/Recommendations Wound Therapy - Clinical Statement: This wound can benefit from PLS to decrease bacterial load and soften up some tissues for selective debriding. Wound Therapy - Functional Problem List: stiff sore L finger/hand movement Factors Delaying/Impairing Wound Healing: Infection - systemic/local Hydrotherapy Plan: Debridement;Dressing change;Patient/family education;Pulsatile lavage with suction Wound Therapy - Frequency: 6X / week Wound Therapy - Current Recommendations: Case manager/social work Wound Therapy - Follow Up  Recommendations: Home health RN Wound Plan: see above  Wound Therapy Goals- Improve the function of patient's  integumentary system by progressing the wound(s) through the phases of wound healing (inflammation - proliferation - remodeling) by: Decrease Necrotic Tissue to: 5 Decrease Necrotic Tissue - Progress: Progressing toward goal Increase Granulation Tissue to: 95 including any healthy tissues Increase Granulation Tissue - Progress: Progressing toward goal Goals/treatment plan/discharge plan were made with and agreed upon by patient/family: Yes Time For Goal Achievement: 7 days Wound Therapy - Potential for Goals: Good  Goals will be updated until maximal potential achieved or discharge criteria met.  Discharge criteria: when goals achieved, discharge from hospital, MD decision/surgical intervention, no progress towards goals, refusal/missing three consecutive treatments without notification or medical reason.  GP     Tessie Fass Janee Ureste 11/20/2018, 1:50 PM 11/20/2018  Donnella Sham, PT Acute Rehabilitation Services [phone]  (pager) [phone]  (office)

## 2018-11-20 NOTE — Significant Event (Signed)
For 6/18 at 0700 to 1900, please page [phone].  Pager [phone] is not working.  Brad Thompson, MD, MS FAMILY MEDICINE RESIDENT - PGY2 11/20/2018 2:58 PM 

## 2018-11-21 LAB — BASIC METABOLIC PANEL
Anion gap: 8 (ref 5–15)
BUN: 12 mg/dL (ref 8–23)
CO2: 28 mmol/L (ref 22–32)
Calcium: 8.4 mg/dL — ABNORMAL LOW (ref 8.9–10.3)
Chloride: 103 mmol/L (ref 98–111)
Creatinine, Ser: 0.82 mg/dL (ref 0.61–1.24)
GFR calc Af Amer: >60 mL/min (ref >60)
GFR calc non Af Amer: >60 mL/min (ref >60)
Glucose, Bld: 129 mg/dL — ABNORMAL HIGH (ref 70–99)
Potassium: 3.7 mmol/L (ref 3.5–5.1)
Sodium: 139 mmol/L (ref 135–145)

## 2018-11-21 LAB — CBC
HCT: 30.9 % — ABNORMAL LOW (ref 39.0–52.0)
Hemoglobin: 9.8 g/dL — ABNORMAL LOW (ref 13.0–17.0)
MCH: 29 pg (ref 26.0–34.0)
MCHC: 31.7 g/dL (ref 30.0–36.0)
MCV: 91.4 fL (ref 80.0–100.0)
Platelets: 152 10*3/uL (ref 150–400)
RBC: 3.38 MIL/uL — ABNORMAL LOW (ref 4.22–5.81)
RDW: 14.6 % (ref 11.5–15.5)
WBC: 5.4 10*3/uL (ref 4.0–10.5)
nRBC: 0 % (ref 0.0–0.2)

## 2018-11-21 LAB — GLUCOSE, CAPILLARY
Glucose-Capillary: 152 mg/dL — ABNORMAL HIGH (ref 70–99)
Glucose-Capillary: 124 mg/dL — ABNORMAL HIGH (ref 70–99)
Glucose-Capillary: 154 mg/dL — ABNORMAL HIGH (ref 70–99)
Glucose-Capillary: 168 mg/dL — ABNORMAL HIGH (ref 70–99)

## 2018-11-21 LAB — AEROBIC/ANAEROBIC CULTURE W GRAM STAIN (SURGICAL/DEEP WOUND)
Culture: NO GROWTH
Gram Stain: NONE SEEN

## 2018-11-21 MED ORDER — OXYCODONE HCL 5 MG PO TABS
5.0000 mg | ORAL_TABLET | Freq: Four times a day (QID) | ORAL | Status: DC | PRN
  Administered 2018-11-21 – 2018-11-23 (×5): 5 mg via ORAL
  Filled 2018-11-21 (×5): qty 1

## 2018-11-21 MED ORDER — POLYETHYLENE GLYCOL 3350 17 G PO PACK
17.0000 g | PACK | Freq: Every day | ORAL | Status: DC
  Administered 2018-11-21 – 2018-11-26 (×6): 17 g via ORAL
  Filled 2018-11-21 (×6): qty 1

## 2018-11-21 MED ORDER — FUROSEMIDE 10 MG/ML IJ SOLN
40.0000 mg | Freq: Once | INTRAMUSCULAR | Status: AC
  Administered 2018-11-21: 40 mg via INTRAVENOUS
  Filled 2018-11-21: qty 4

## 2018-11-21 MED ORDER — FAMOTIDINE 10 MG PO TABS
10.0000 mg | ORAL_TABLET | Freq: Once | ORAL | Status: AC
  Administered 2018-11-21: 10 mg via ORAL
  Filled 2018-11-21: qty 1

## 2018-11-21 NOTE — Significant Event (Signed)
For 6/19 0700 to 1900, please page [phone]  Pager [phone] is not working.  Brad Shaun Runyon, MD, MS FAMILY MEDICINE RESIDENT - PGY2 11/21/2018 7:42 AM 

## 2018-11-21 NOTE — Progress Notes (Signed)
Patient still having trouble urinating with no output. Bladder scan performed with 389 and in and out performed with 550ml returned. Foley cath inserted. Patient tolerated well.

## 2018-11-21 NOTE — Progress Notes (Signed)
FPTS Interim Progress Note  Paged by nurse that patient had some SOB that improved with albuterol.  She notes that this has happened the past few nights, but that patient was later getting albuterol today.  Also with tachycardia to 120s, but on chart review appears to occur intermittently.  Patient did receive IV Lasix today for fluid overload.  He is currently stable and breathing comfortably on 4L per [place], which he has also been on during this hospitalization.  Will continue to monitor.  [place], DO 11/21/2018, 9:23 PM PGY-1, [place] [facility] Service pager [phone]

## 2018-11-21 NOTE — Care Management Important Message (Signed)
Important Message  Patient Details  Name: [name] MRN: [id number] Date of Birth: [birth year]   Medicare Important Message Given:  Yes     Memory Argue 11/21/2018, 4:21 PM

## 2018-11-21 NOTE — Progress Notes (Signed)
Family Medicine Teaching Service Daily Progress Note Intern Pager: [phone]  Patient name: [name] Medical record number: [id number] Date of birth: January 01, 1932 Age: 85 y.o. Gender: male  Primary Care Provider: Nicoletta Dress, MD Consultants: none Code Status: Full  Pt Overview and Major Events to Date:  6/13 - admitted with L arm cellulitis 6/14 - I+D of Left forearm abscess 6/17 - hydrotherapy  Assessment and Plan: [name] is a 85 y.o. male presenting with L arm redness. PMH is significant for DM, Afib on pradaxa, aortic stenosis s/p TAVR, PVD, CAD s/p CABG, HTN, COPD, HLD, HFpEF, GERD.  Decompensated heart failure likely secondary to excessive volume from IV antibiotics Chest x-ray showing significant bilateral pleural effusions on 6/18.  1.3 L UOP on 6/18.  Net +3.3 L for hospital admission so far.  Physical exam today shows persistent fluid overload with remarkable lower extremity edema, rales in the lungs diffusely, bilaterally and continued JVD to about 1 cm below the angle of his jaw..  We will continue diuresis with IV Lasix.  Suspect that it will significantly improve as we are transitioning to p.o. antibiotics.  Due to frequent I/O caths, an indwelling Foley catheter was placed. -Lasix IV 40 mg -Strict I's and O's -Daily weights  L arm cellulitis. S/p incision and drainage in the OR on 0/10 without complication.  Hydrotherapy performed on 6/18.  Ortho is noting improvement in the wound and no further need for I&D.  Will convert to p.o. antibiotics.  He will follow-up with orthopedics outpatient for further wound care. -DC Ancef (6/13-6/14) -DC vanc (6/14-6/16 due to concern for potential kidney injury -Unasyn (6/14 - ), transition to p.o. Augmentin today -Tylenol 650 every 6 hours scheduled -Oxycodone 5 mg every 6 hours scheduled -morphine 2 mg every 4 hours as needed -Dilaudid 1 mg given for hydrotherapy -OT - outpatient follow up  A Fib.   Stable,  rate controlled.   -Pradaxa restarted on 6/18 -metoprolol 50 mg twice daily  Diabetes.  Last A1c viewable is 6.1 on 09/13/2017.  -holding metformin 500 mg daily -Monitor CBGs in the setting of acute infection  PVD, CAD s/p CABG, HLD -Continue home atorvastatin   GERD -Pantoprazole 40 mg daily  HFpEF, HTN  Stable - Lasix as above - terazosin 5 mg daily - metoprolol as per above  COPD -Incruse daily -Duo nebs in a.m. and p.m.  Anemia, iron deficiency -Iron supplements  Allergies -Loratadine 10 mg daily  FEN/GI: Heart healthy carb modified Prophylaxis: Heparin   Disposition: 1 additional day of hospitalization anticipated prior to discharge home  Subjective:  No acute events overnight.  Hydrotherapy went well yesterday with a significantly improved pain control with Dilaudid.  Reports his breathing feels significantly improved as well though not totally back to normal.  Overall, he reports that his hand is much improved he has greater mobility with his fingers.  Objective: Temp:  [98.2 F (36.8 C)-99 F (37.2 C)] 98.4 F (36.9 C) (06/19 0452) Pulse Rate:  [62-122] 62 (06/19 0452) Resp:  [15-19] 18 (06/19 0452) BP: (130-155)/(53-69) 139/69 (06/19 0452) SpO2:  [93 %-97 %] 93 % (06/19 0452) Physical Exam: General: Alert and cooperative and appears to be in no acute distress HEENT: JVD noted about 1 cm below the angle of the jaw Cardio: Normal S1, S2.  Irregularly irregular rhythm. Pulm: Rales heard diffusely.  Good air movement in all fields.  Difficulty completing long sentences on 4 L nasal cannula. Abdomen: Bowel sounds normal.  Abdomen soft and non-tender.  Extremities: 3+ pitting edema in lower extremities bilaterally.  Strong radial pulse. Neuro: Cranial nerves grossly intact    Laboratory: Recent Labs  Lab 11/19/18 0353 11/20/18 1214 11/21/18 0402  WBC 5.4 5.6 5.4  HGB 10.9* 10.0* 9.8*  HCT 34.2* 31.6* 30.9*  PLT 163 144* 152   Recent Labs  Lab  11/15/18 2000  11/18/18 0441 11/19/18 0353 11/21/18 0402  NA 134*   < > 139 139 139  K 4.1   < > 3.3* 3.6 3.7  CL 100   < > 102 104 103  CO2 25   < > 28 24 28   BUN 15   < > 14 14 12   CREATININE 1.04   < > 0.90 1.04 0.82  CALCIUM 8.9   < > 8.3* 8.2* 8.4*  PROT 6.0*  --   --   --   --   BILITOT 1.1  --   --   --   --   ALKPHOS 101  --   --   --   --   ALT 28  --   --   --   --   AST 23  --   --   --   --   GLUCOSE 220*   < > 147* 159* 129*   < > = values in this interval not displayed.    Imaging/Diagnostic Tests: Dg Chest 2 View  Result Date: 11/20/2018 CLINICAL DATA:  Shortness of breath and cough EXAM: CHEST - 2 VIEW COMPARISON:  08/25/2018 prior radiographs FINDINGS: Cardiomegaly and CABG/cardiac valve replacement changes again noted. Pulmonary vascular congestion with interstitial edema noted. Small bilateral pleural effusions and bibasilar atelectasis identified. No pneumothorax. IMPRESSION: Cardiomegaly with interstitial pulmonary edema and small bilateral pleural effusions. Electronically Signed   By: Margarette Canada M.D.   On: 11/20/2018 11:06    Matilde Haymaker, MD 11/21/2018, 5:57 AM PGY-1, [place] Intern pager: [phone], text pages welcome

## 2018-11-21 NOTE — Progress Notes (Signed)
Physical Therapy Wound Treatment Patient Details  Name: [name] MRN: [id number] Date of Birth: 15-Oct-1931  Today's Date: 11/21/2018 Time: 0973-5329 Time Calculation (min): 32 min  Subjective  Patient and Family Stated Goals: Healed up so I can use it. Prior Treatments: unknown  Pain Score: Pain Score: 4 (premedicated for hydrotherapy)  Wound Assessment  Wound / Incision (Open or Dehisced) 11/19/18 Other (Comment) Arm Left left dorsal cellulitic wound post I and D (Active)  Wound Image   11/19/18 1654  Dressing Type Compression wrap;Gauze (Comment);Impregnated gauze (bismuth);Moist to dry 11/21/18 1440  Dressing Changed New 11/19/18 1654  Dressing Status Clean;Dry;Intact 11/21/18 1440  Dressing Change Frequency Daily 11/21/18 1440  Site / Wound Assessment Purple;Pink 11/21/18 1440  % Wound base Red or Granulating 85% 11/21/18 1440  % Wound base Yellow/Fibrinous Exudate 15% 11/21/18 1440  % Wound base Black/Eschar 0% 11/21/18 1440  Peri-wound Assessment Erythema (blanchable);Edema;Purple;Pink 11/21/18 1440  Wound Length (cm) 3 cm 11/19/18 1654  Wound Width (cm) 3 cm 11/19/18 1654  Wound Depth (cm) 0.3 cm 11/19/18 1654  Wound Volume (cm^3) 2.7 cm^3 11/19/18 1654  Wound Surface Area (cm^2) 9 cm^2 11/19/18 1654  Undermining (cm) 12*  2 cm 11/19/18 1654  Margins Unattached edges (unapproximated) 11/21/18 1440  Drainage Amount Minimal 11/21/18 1440  Drainage Description Serosanguineous 11/21/18 1440  Treatment Cleansed;Debridement (Selective);Hydrotherapy (Pulse lavage);Other (Comment);Packing (Saline gauze) 11/21/18 1440     Wound / Incision (Open or Dehisced) 11/19/18 Other (Comment) Arm Left ventral forearm wound post I and D (Active)  Wound Image   11/19/18 1654  Dressing Type Compression wrap;Gauze (Comment);Impregnated gauze (bismuth);Moist to dry 11/21/18 1440  Dressing Changed Changed 11/20/18 1346  Dressing Status Clean;Intact 11/21/18 1440  Dressing Change Frequency  Daily 11/21/18 1440  Site / Wound Assessment Clean;Dry;Painful;Red 11/21/18 1440  % Wound base Red or Granulating 50% 11/21/18 1440  % Wound base Yellow/Fibrinous Exudate 15% 11/21/18 1440  % Wound base Other/Granulation Tissue (Comment) 35% 11/21/18 1440  Peri-wound Assessment Other (Comment);Edema;Erythema (blanchable);Purple 11/21/18 1440  Wound Length (cm) 21 cm 11/19/18 1654  Wound Width (cm) 4 cm 11/19/18 1654  Wound Depth (cm) 1 cm 11/19/18 1654  Wound Volume (cm^3) 84 cm^3 11/19/18 1654  Wound Surface Area (cm^2) 84 cm^2 11/19/18 1654  Tunneling (cm) 4  @ 6 * 11/19/18 1654  Drainage Amount Minimal 11/21/18 1440  Drainage Description Serosanguineous 11/21/18 1440  Treatment Cleansed;Debridement (Selective);Hydrotherapy (Pulse lavage);Negative pressure wound therapy 11/21/18 1440      Hydrotherapy Pulsed lavage therapy - wound location: l forearm dorsum and ventral surfaces due to cellulitis from striking the back of his arm.   Pulsed Lavage with Suction (psi): 4 psi Pulsed Lavage with Suction - Normal Saline Used: 1000 mL Pulsed Lavage Tip: Tip with splash shield Selective Debridement Selective Debridement - Location: L forearm Selective Debridement - Tools Used: Forceps;Scissors;Scalpel Selective Debridement - Tissue Removed: Necrotic trash and other tissues.   Wound Assessment and Plan  Wound Therapy - Assess/Plan/Recommendations Wound Therapy - Clinical Statement: This wound can benefit from PLS to decrease bacterial load and soften up some tissues for selective debriding. Wound Therapy - Functional Problem List: stiff sore L finger/hand movement Factors Delaying/Impairing Wound Healing: Infection - systemic/local Hydrotherapy Plan: Debridement;Dressing change;Patient/family education;Pulsatile lavage with suction Wound Therapy - Frequency: 6X / week Wound Therapy - Current Recommendations: Case manager/social work Wound Therapy - Follow Up Recommendations: Home health RN  Wound Plan: see above  Wound Therapy Goals- Improve the function of patient's integumentary system by progressing  the wound(s) through the phases of wound healing (inflammation - proliferation - remodeling) by: Decrease Necrotic Tissue to: 5% Decrease Necrotic Tissue - Progress: Progressing toward goal Increase Granulation Tissue to: 95 including any healthy tissues Increase Granulation Tissue - Progress: Progressing toward goal Goals/treatment plan/discharge plan were made with and agreed upon by patient/family: Yes Time For Goal Achievement: 7 days Wound Therapy - Potential for Goals: Good  Goals will be updated until maximal potential achieved or discharge criteria met.  Discharge criteria: when goals achieved, discharge from hospital, MD decision/surgical intervention, no progress towards goals, refusal/missing three consecutive treatments without notification or medical reason.  GP     Kenneth V Mottinger 11/21/2018, 2:46 PM 11/21/2018  Ken Mottinger, PT Acute Rehabilitation Services [phone]  (pager) [phone]  (office) 

## 2018-11-21 NOTE — Progress Notes (Signed)
RN spoke with pt's wife about plan of care. Wife wondering when pt might be discharged from hospital because [facility]. Also asking about home health needs for dressing changes. RN informed wife that dressing would stay on until hydrotherapy follow up and they would change dressing there and give her further instructions if dressing changes needed to be done at home.

## 2018-11-22 ENCOUNTER — Inpatient Hospital Stay (HOSPITAL_COMMUNITY): Payer: Medicare Other

## 2018-11-22 DIAGNOSIS — I4891 Unspecified atrial fibrillation: Secondary | ICD-10-CM

## 2018-11-22 LAB — BASIC METABOLIC PANEL
Anion gap: 7 (ref 5–15)
BUN: 13 mg/dL (ref 8–23)
CO2: 28 mmol/L (ref 22–32)
Calcium: 8.2 mg/dL — ABNORMAL LOW (ref 8.9–10.3)
Chloride: 100 mmol/L (ref 98–111)
Creatinine, Ser: 0.84 mg/dL (ref 0.61–1.24)
GFR calc Af Amer: >60 mL/min (ref >60)
GFR calc non Af Amer: >60 mL/min (ref >60)
Glucose, Bld: 140 mg/dL — ABNORMAL HIGH (ref 70–99)
Potassium: 3.5 mmol/L (ref 3.5–5.1)
Sodium: 135 mmol/L (ref 135–145)

## 2018-11-22 LAB — CBC
HCT: 31.5 % — ABNORMAL LOW (ref 39.0–52.0)
Hemoglobin: 10.1 g/dL — ABNORMAL LOW (ref 13.0–17.0)
MCH: 28.9 pg (ref 26.0–34.0)
MCHC: 32.1 g/dL (ref 30.0–36.0)
MCV: 90.3 fL (ref 80.0–100.0)
Platelets: 175 10*3/uL (ref 150–400)
RBC: 3.49 MIL/uL — ABNORMAL LOW (ref 4.22–5.81)
RDW: 14.4 % (ref 11.5–15.5)
WBC: 5.6 10*3/uL (ref 4.0–10.5)
nRBC: 0 % (ref 0.0–0.2)

## 2018-11-22 LAB — TROPONIN I
Troponin I: 0.08 ng/mL (ref <0.03)
Troponin I: 0.07 ng/mL (ref <0.03)

## 2018-11-22 LAB — GLUCOSE, CAPILLARY
Glucose-Capillary: 135 mg/dL — ABNORMAL HIGH (ref 70–99)
Glucose-Capillary: 149 mg/dL — ABNORMAL HIGH (ref 70–99)
Glucose-Capillary: 134 mg/dL — ABNORMAL HIGH (ref 70–99)
Glucose-Capillary: 159 mg/dL — ABNORMAL HIGH (ref 70–99)

## 2018-11-22 MED ORDER — ALUM & MAG HYDROXIDE-SIMETH 200-200-20 MG/5ML PO SUSP
30.0000 mL | Freq: Once | ORAL | Status: AC
  Administered 2018-11-22: 30 mL via ORAL
  Filled 2018-11-22: qty 30

## 2018-11-22 MED ORDER — METOPROLOL TARTRATE 5 MG/5ML IV SOLN
5.0000 mg | INTRAVENOUS | Status: DC | PRN
  Administered 2018-11-22 – 2018-11-23 (×2): 5 mg via INTRAVENOUS
  Filled 2018-11-22 (×2): qty 5

## 2018-11-22 MED ORDER — FUROSEMIDE 40 MG PO TABS
40.0000 mg | ORAL_TABLET | Freq: Every day | ORAL | Status: DC
  Administered 2018-11-22 – 2018-11-23 (×2): 40 mg via ORAL
  Filled 2018-11-22 (×2): qty 1

## 2018-11-22 MED ORDER — FUROSEMIDE 20 MG PO TABS
20.0000 mg | ORAL_TABLET | Freq: Two times a day (BID) | ORAL | Status: DC
  Administered 2018-11-22: 20 mg via ORAL
  Filled 2018-11-22: qty 1

## 2018-11-22 MED ORDER — FUROSEMIDE 40 MG PO TABS: 40.0000 mg | ORAL_TABLET | Freq: Every day | ORAL | Status: DC

## 2018-11-22 MED ORDER — FINASTERIDE 5 MG PO TABS
5.0000 mg | ORAL_TABLET | Freq: Every day | ORAL | Status: DC
  Administered 2018-11-22 – 2018-11-26 (×5): 5 mg via ORAL
  Filled 2018-11-22 (×5): qty 1

## 2018-11-22 MED ORDER — ALBUTEROL SULFATE (2.5 MG/3ML) 0.083% IN NEBU
2.5000 mg | INHALATION_SOLUTION | Freq: Three times a day (TID) | RESPIRATORY_TRACT | Status: DC
  Administered 2018-11-22 – 2018-11-24 (×5): 2.5 mg via RESPIRATORY_TRACT
  Filled 2018-11-22 (×5): qty 3

## 2018-11-22 NOTE — Progress Notes (Signed)
CRITICAL VALUE ALERT  Critical Value:  Troponin 0.08  Date & Time Notied:  11/22/2018 07:43  Provider Notified: MD Notified  Orders Received/Actions taken: Pt denies chest pain or tightness, awaiting new orders.

## 2018-11-22 NOTE — Progress Notes (Addendum)
Family Medicine Teaching Service Daily Progress Note Intern Pager: [phone]  Patient name: [name] Medical record number: [id number] Date of birth: [birth year] Age: 85 y.o. Gender: male  Primary Care Provider: Nicoletta Dress, MD Consultants: none Code Status: Full  Pt Overview and Major Events to Date:  6/13 - admitted with L arm cellulitis 6/14 - I+D of Left forearm abscess 6/17 - hydrotherapy  Assessment and Plan: [name] is a 85 y.o. male presenting with L arm redness. PMH is significant for DM, Afib on pradaxa, aortic stenosis s/p TAVR, PVD, CAD s/p CABG, HTN, COPD, HLD, HFpEF, GERD.  Decompensated heart failure likely secondary to excessive volume from IV antibiotics Chest x-ray showing significant bilateral pleural effusions on 6/18. Still requiring 4L of O2 [place] to breathe comfortably. O2 sats good and respiratory rate normal. U/O of 1.07L on 6/19.  Net +3.2 L for hospital admission so far. Physical exam shows improving LE edema and no noticeable JVD today. We will continue diuresis with IV Lasix.  Suspect that it will significantly improve as we are transitioning to p.o. antibiotics.  Due to frequent I/O caths, an indwelling Foley catheter was placed. - Transition to oral Lasix home dose today 20mg  BID - Strict I's and O's - Daily weights  L arm cellulitis. S/p incision and drainage in the OR on 3/41 without complication.  Hydrotherapy performed on 6/18.  Ortho is noting improvement in the wound and no further need for I&D.  Will convert to p.o. antibiotics.  He will follow-up with orthopedics outpatient for further wound care. -DC Ancef (6/13-6/14) -DC vanc (6/14-6/16 due to concern for potential kidney injury -Unasyn (6/14 - ) - Now on Augmentin ( started 6/18) for total of 14 days of treatment -Tylenol 650 every 6 hours scheduled -Oxycodone 5 mg every 6 hours scheduled -morphine 2 mg every 4 hours as needed -Dilaudid 1 mg given for hydrotherapy -OT -  outpatient follow up  A Fib.   Stable, rate controlled.  -Pradaxa restarted on 6/18 -metoprolol 50 mg twice daily  Diabetes.  Last A1c viewable is 6.1 on 09/13/2017.  -holding metformin 500 mg daily -Monitor CBGs in the setting of acute infection  PVD, CAD s/p CABG, HLD -Continue home atorvastatin   GERD -Pantoprazole 40 mg daily  HFpEF, HTN  Stable - Lasix as above - terazosin 5 mg daily - metoprolol as per above  COPD -Incruse daily -Duo nebs in a.m. and p.m.  Anemia, iron deficiency -Iron supplements  Allergies -Loratadine 10 mg daily  FEN/GI: Heart healthy carb modified Prophylaxis: Heparin   Disposition: 1 additional day of hospitalization anticipated prior to discharge home  Subjective:  Patient had an episode of "heartburn last night". No chest pain or chest tightness this am. He states his left arm is still sore but not worse from yesterday. He would like to go home today. He is having mild increased work of breathing but states its b/c he is usually on 3L of O2 at home and the nurse last night kept moving it down to 2L. He is on 4L now and is feeling better.  Objective: Temp:  [98.3 F (36.8 C)-98.5 F (36.9 C)] 98.4 F (36.9 C) (06/19 1936) Pulse Rate:  [60-128] 128 (06/19 2054) Resp:  [17-23] 23 (06/19 2054) BP: (139-163)/(60-72) 159/72 (06/19 2054) SpO2:  [93 %-100 %] 96 % (06/19 2054) Physical Exam: Gen: Alert and Oriented x 3, NAD HEENT: Normocephalic, atraumatic Neck: no JVD CV: Irregularly irregular, no murmurs, normal S1,  S2 split Resp: decreased breath sounds diffusely, no crackles no wheezing Abd: non-distended, non-tender, soft, +bs in all four quadrants MSK: left arm wrapped in coban Ext: no clubbing,  Cyanosis, +1 bilateral lower extremity edema Skin: warm, dry, intact, no rashes   Laboratory: Recent Labs  Lab 11/20/18 1214 11/21/18 0402 11/22/18 0330  WBC 5.6 5.4 5.6  HGB 10.0* 9.8* 10.1*  HCT 31.6* 30.9* 31.5*  PLT 144*  152 175   Recent Labs  Lab 11/15/18 2000  11/19/18 0353 11/21/18 0402 11/22/18 0330  NA 134*   < > 139 139 135  K 4.1   < > 3.6 3.7 3.5  CL 100   < > 104 103 100  CO2 25   < > 24 28 28   BUN 15   < > 14 12 13   CREATININE 1.04   < > 1.04 0.82 0.84  CALCIUM 8.9   < > 8.2* 8.4* 8.2*  PROT 6.0*  --   --   --   --   BILITOT 1.1  --   --   --   --   ALKPHOS 101  --   --   --   --   ALT 28  --   --   --   --   AST 23  --   --   --   --   GLUCOSE 220*   < > 159* 129* 140*   < > = values in this interval not displayed.    Imaging/Diagnostic Tests: No results found.  [name], DO 11/22/2018, 1:27 AM PGY-2, [place] Intern pager: [phone], text pages welcome

## 2018-11-22 NOTE — Progress Notes (Signed)
Notified Talley Kreiser @[phone] and Lynx Goodrich @ [phone] about pt new IV Metoprolol PRN medication due to HR reaching 150s and Dr. Shan Levans seeing pt at bedside.

## 2018-11-22 NOTE — Progress Notes (Signed)
Lab called and pt labs for HCV ab and Hep B will be collected in AM with rest of lab work.

## 2018-11-22 NOTE — Progress Notes (Signed)
Pt complained of difficulty breathing, denied chest pain or tightness, stated his indigestion was relieved by tums given earlier. O2 sat at 4L = 98%, BP 133/78, PR=102. Pt could reach 1000 x5 on IS, encouraged continued use of IS and flutter valve, repositioned. MD paged. Pt currently sitting upright in bed, states he feels better, O2 sat 95% at 4L, PR=92, PRN albuterol not due until 12:00, wife called and updated. Will continue to monitor.

## 2018-11-22 NOTE — Progress Notes (Signed)
Notified on call family medicine of pt HR reaching to the 150's. Pt denies chest pain. C/O being short of breath and using accessory muscles to breath along with pursed lip breathing. Dr. Shan Levans stated she was "going to come and see pt". New order for Metoprolol 5mg  IVP Q4min for HR>130.

## 2018-11-22 NOTE — Progress Notes (Signed)
Physical Therapy Wound Treatment Patient Details  Name: [name] MRN: [id number] Date of Birth: 1932-05-23  Today's Date: 11/22/2018 Time: 0175-1025 Time Calculation (min): 33 min  Subjective  Patient and Family Stated Goals: Healed up so I can use it. Prior Treatments: unknown  Pain Score: Pain Score: 5 , Premedicated and then pain 4/10  Wound Assessment  Wound / Incision (Open or Dehisced) 11/19/18 Other (Comment) Arm Left left dorsal cellulitic wound post I and D (Active)  Wound Image   11/19/18 1654  Dressing Type Compression wrap;Gauze (Comment);Impregnated gauze (bismuth);Moist to dry 11/22/18 1311  Dressing Changed New 11/19/18 1654  Dressing Status Clean;Dry;Intact 11/22/18 1311  Dressing Change Frequency Daily 11/22/18 1311  Site / Wound Assessment Purple;Pink 11/22/18 1311  % Wound base Red or Granulating 90% 11/22/18 1311  % Wound base Yellow/Fibrinous Exudate 10% 11/22/18 1311  % Wound base Black/Eschar 0% 11/22/18 1311  Peri-wound Assessment Erythema (blanchable);Edema;Purple;Pink 11/22/18 1311  Wound Length (cm) 3 cm 11/19/18 1654  Wound Width (cm) 3 cm 11/19/18 1654  Wound Depth (cm) 0.3 cm 11/19/18 1654  Wound Volume (cm^3) 2.7 cm^3 11/19/18 1654  Wound Surface Area (cm^2) 9 cm^2 11/19/18 1654  Undermining (cm) 12*  2 cm 11/19/18 1654  Margins Unattached edges (unapproximated) 11/22/18 1311  Drainage Amount Minimal 11/22/18 1311  Drainage Description Serosanguineous 11/22/18 1311  Treatment Cleansed;Debridement (Selective);Hydrotherapy (Pulse lavage);Packing (Saline gauze);Other (Comment) 11/22/18 1311     Wound / Incision (Open or Dehisced) 11/19/18 Other (Comment) Arm Left ventral forearm wound post I and D (Active)  Wound Image   11/19/18 1654  Dressing Type Compression wrap;Gauze (Comment);Impregnated gauze (bismuth);Moist to dry 11/22/18 1311  Dressing Changed Changed 11/20/18 1346  Dressing Status Clean;Intact 11/22/18 1311  Dressing Change  Frequency Daily 11/22/18 1311  Site / Wound Assessment Clean;Dry;Painful;Red 11/22/18 1311  % Wound base Red or Granulating 60% 11/22/18 1311  % Wound base Yellow/Fibrinous Exudate 10% 11/22/18 1311  % Wound base Other/Granulation Tissue (Comment) 30% 11/22/18 1311  Peri-wound Assessment Other (Comment);Edema;Erythema (blanchable);Purple 11/22/18 1311  Wound Length (cm) 21 cm 11/19/18 1654  Wound Width (cm) 4 cm 11/19/18 1654  Wound Depth (cm) 1 cm 11/19/18 1654  Wound Volume (cm^3) 84 cm^3 11/19/18 1654  Wound Surface Area (cm^2) 84 cm^2 11/19/18 1654  Tunneling (cm) 4  @ 6 * 11/19/18 1654  Drainage Amount Minimal 11/22/18 1311  Drainage Description Serosanguineous 11/22/18 1311  Treatment Cleansed;Debridement (Selective);Hydrotherapy (Pulse lavage);Packing (Saline gauze);Other (Comment) 11/22/18 1311      Hydrotherapy Pulsed lavage therapy - wound location: l forearm dorsum and ventral surfaces due to cellulitis from striking the back of his arm.   Pulsed Lavage with Suction (psi): 4 psi Pulsed Lavage with Suction - Normal Saline Used: 1000 mL Pulsed Lavage Tip: Tip with splash shield Selective Debridement Selective Debridement - Location: L forearm Selective Debridement - Tools Used: Forceps;Scissors;Scalpel Selective Debridement - Tissue Removed: Necrotic trash and other tissues.   Wound Assessment and Plan  Wound Therapy - Assess/Plan/Recommendations Wound Therapy - Clinical Statement: This wound can benefit from PLS to decrease bacterial load and soften up some tissues for selective debriding.  Will also use an appropriate dressing to keep tendons moist. Wound Therapy - Functional Problem List: stiff sore L finger/hand movement Factors Delaying/Impairing Wound Healing: Infection - systemic/local Hydrotherapy Plan: Debridement;Dressing change;Patient/family education;Pulsatile lavage with suction Wound Therapy - Frequency: 6X / week Wound Therapy - Current Recommendations: Case  manager/social work Wound Therapy - Follow Up Recommendations: Home health RN Wound Plan: see  above  Wound Therapy Goals- Improve the function of patient's integumentary system by progressing the wound(s) through the phases of wound healing (inflammation - proliferation - remodeling) by: Decrease Necrotic Tissue to: 5% Decrease Necrotic Tissue - Progress: Progressing toward goal Increase Granulation Tissue to: 95 including any healthy tissues Increase Granulation Tissue - Progress: Progressing toward goal Goals/treatment plan/discharge plan were made with and agreed upon by patient/family: Yes Time For Goal Achievement: 7 days Wound Therapy - Potential for Goals: Good  Goals will be updated until maximal potential achieved or discharge criteria met.  Discharge criteria: when goals achieved, discharge from hospital, MD decision/surgical intervention, no progress towards goals, refusal/missing three consecutive treatments without notification or medical reason.  GP     Tessie Fass Edoardo Laforte 11/22/2018, 1:15 PM 11/22/2018  Donnella Sham, PT Acute Rehabilitation Services [phone]  (pager) [phone]  (office)

## 2018-11-22 NOTE — Progress Notes (Signed)
Dr. Shan Levans at bedside.

## 2018-11-22 NOTE — Progress Notes (Signed)
FPTS Interim Progress Note  Patient with continued "indigestion" despite receiving tums through the days and pepcid this PM.  Given this, will check trop and get EKG to rule out ACS, although patient does state that this happens sometimes at home.  Will also order GI cocktail x1.  Yocheved Depner, Bernita Raisin, DO 11/22/2018, 4:31 AM PGY-1, [facility] pager [phone]

## 2018-11-22 NOTE — TOC Initial Note (Signed)
Transition of Care [facility]) - Initial/Assessment Note    Patient Details  Name: [name] MRN: [id number] Date of Birth: Feb 16, 1932  [facility] CM/SW Contact:    Claudie Leach, RN Phone Number: 11/22/2018, 2:20 PM  Clinical Narrative:                 Pt to return home with wife.  [name] wife was discharged from [facility] 6/18 after hospitalization for sepsis.  Pt and wife are concerned that they are not strong enough to be at home alone together and may need additional help.  Wife has home health PT through [facility] that started today.  Patient's insurance makes him eligible for [facility] program.  However, they live in [place], [place] not part of [employer].  Discussed with [facility] from [place].  He will assess availability of [facility] staff in their area with intentions of starting services tomorrow or Monday.  Either way, [name] will accept Mr. Hao for traditional home health services if [facility] cannot be provided.      Expected Discharge Plan: [place] Barriers to Discharge: Continued Medical Work up   Patient Goals and CMS Choice Patient states their goals for this hospitalization and ongoing recovery are:: to get back home CMS Medicare.gov Compare Post Acute Care list provided to:: Patient Choice offered to / list presented to : Patient  Expected Discharge Plan and Services Expected Discharge Plan: [place]   Discharge Planning Services: CM Consult Post Acute Care Choice: [place] arrangements for the past 2 months: Single Family Home                           HH Arranged: RN, PT, OT, Nurse's Aide, Respirator Therapy HH Agency: [place] Date [facility] Agency Contacted: 11/22/18 Time HH Agency Contacted: 3 Representative spoke with at [place]: Tommi Rumps  Prior Living Arrangements/Services Living arrangements for the past 2 months: [place] with::  Spouse Patient language and need for interpreter reviewed:: Yes Do you feel safe going back to the place where you live?: Yes      Need for Family Participation in Patient Care: Yes (Comment) Care giver support system in place?: Yes (comment) Current home services: DME Criminal Activity/Legal Involvement Pertinent to Current Situation/Hospitalization: No - Comment as needed  Activities of Daily Living Home Assistive Devices/Equipment: None ADL Screening (condition at time of admission) Patient's cognitive ability adequate to safely complete daily activities?: Yes Is the patient deaf or have difficulty hearing?: No Does the patient have difficulty seeing, even when wearing glasses/contacts?: No Does the patient have difficulty concentrating, remembering, or making decisions?: No Patient able to express need for assistance with ADLs?: Yes Does the patient have difficulty dressing or bathing?: No Independently performs ADLs?: Yes (appropriate for developmental age) Does the patient have difficulty walking or climbing stairs?: No Weakness of Legs: None Weakness of Arms/Hands: None  Permission Sought/Granted                  Emotional Assessment Appearance:: Appears stated age Attitude/Demeanor/Rapport: Engaged, Gracious Affect (typically observed): Accepting Orientation: : Oriented to Self, Oriented to Place, Oriented to  Time Alcohol / Substance Use: Not Applicable Psych Involvement: No (comment)  Admission diagnosis:  Cellulitis of left upper extremity [L03.114] Patient Active Problem List   Diagnosis Date Noted  . SOB (shortness of breath)   .  Cellulitis 11/15/2018  . 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 Severe aortic stenosis 09/17/2017  . Mitral stenosis   . Aftercare following surgery of the circulatory system 03/15/2014  . Carotid stenosis 03/15/2014  . Unstable angina ([place]) 08/31/2013  . Acute myocardial infarction, unspecified site,  initial episode of care   . Aortic stenosis   . Atrial fibrillation ([place])   . Vertigo 06/27/2013  . Chest pain with moderate risk of acute coronary syndrome 06/27/2013  . CAD - CABG '94, low risk Myoview May 2014 06/27/2013  . Chronic atrial fibrillation 06/27/2013  . Chronic anticoagulation 06/27/2013  . Gout attack- on steroid dose pack 06/27/2013  . Tachycardia- beta blocker increased 06/26/2013  . Essential hypertension, benign 03/10/2009  . HYPERLIPIDEMIA 09/09/2008  . UNSPECIFIED ANEMIA 09/09/2008  . CAROTID STENOSIS- moderate 09/09/2008  . UNSPECIFIED CEREBROVASCULAR DISEASE 09/09/2008  . RENAL ARTERY STENOSIS- moderate 09/09/2008  . PERIPHERAL VASCULAR DISEASE 09/09/2008  . PEPTIC ULCER DISEASE, HX OF 09/09/2008   PCP:  Nicoletta Dress, MD Pharmacy:   [facility], [place] [address] Phone: [phone] Fax: [phone]  [facility] [place], [place] [address] [place] [address] Phone: [phone] Fax: [phone]  [facility] [place] [place] - [address] [place] [address] Phone: [phone] Fax: [phone]     Social Determinants of Health (SDOH) Interventions    Readmission Risk Interventions No flowsheet data found.

## 2018-11-22 NOTE — Plan of Care (Signed)
  Problem: Clinical Measurements: Goal: Ability to avoid or minimize complications of infection will improve Outcome: Progressing   Problem: Education: Goal: Knowledge of General Education information will improve Description: Including pain rating scale, medication(s)/side effects and non-pharmacologic comfort measures Outcome: Progressing   Problem: Activity: Goal: Risk for activity intolerance will decrease Outcome: Progressing   Problem: Coping: Goal: Level of anxiety will decrease Outcome: Progressing   Problem: Elimination: Goal: Will not experience complications related to bowel motility Outcome: Progressing   Problem: Pain Managment: Goal: General experience of comfort will improve Outcome: Progressing   Problem: Safety: Goal: Ability to remain free from injury will improve Outcome: Progressing   Problem: Skin Integrity: Goal: Risk for impaired skin integrity will decrease Outcome: Progressing

## 2018-11-22 NOTE — Progress Notes (Signed)
S: Went to assess patient after being paged about tachycardia and shortness of breath.  Patient appeared slightly short of breath but otherwise comfortable.  Patient had just received an albuterol breathing treatment.  Patient reported that he has been urinating well and has not felt any bladder fullness.  He said that his left arm is causing him significant pain and was requesting morphine.    O: HR in the 120s, RR 15 on 4L O2.  Cardiac exam significant for sinus tachycardia, pulmonary exam without crackles or wheezing.  Alert and oriented.  A/P: Respiratory and cardiac status are stable currently.  Patient has IV Lopressor PRN for heart rate greater than 140.  Counseled patient that we will try oxycodone for his arm pain before IV morphine due to the respiratory depression risk of IV morphine.  Encouraged patient and nurse to let us know if his breathing status worsens overnight.  Tywana Robotham C. Shan Levans, MD PGY-2, [facility] 11/22/2018 8:56 PM

## 2018-11-22 NOTE — Evaluation (Signed)
Physical Therapy Evaluation Patient Details Name: [name] MRN: [id number] DOB: Sep 14, 1931 Today's Date: 11/22/2018   History of Present Illness  85 y.o. male admitted on 11/15/2018 with cellulitis of LUE s/p I&D 6/14 and hydro 6/18 . Also admitted with Decompensated heart failure likely secondary to excessive volume from IV antibiotics. PMH includes DM, Afib on pradaxa, aortic stenosis s/p TAVR, PVD, CAD s/p CABG, HTN, COPD, HLD, HFpEF.  Clinical Impression  Patient presents with generalized weakness, deconditioning, dyspnea on exertion, decreased endurance and activity tolerance and impaired mobility s/p above. Pt independent PTA and lives with spouse (who recently got d/ced from the hospital). Pt requires Min A to stand from all surfaces and close Min guard for balance/safety for ambulation using RW. Sp02 dropped to 86% on 4L/min 02 (pt normally on 3L) and HR ranged from 101-126 bpm Afib during mobility. Pt with 3/4 DOE and takes >5 mins to recover. Reports this is not baseline. Concerned about pt going home today as his wife just got d/ced from the hospital recently. Will follow acutely to maximize independence and mobility prior to return home.    Follow Up Recommendations Home health PT;Supervision for mobility/OOB;Supervision/Assistance - 24 hour    Equipment Recommendations  None recommended by PT    Recommendations for Other Services       Precautions / Restrictions Precautions Precautions: Fall Precaution Comments: L UE edematous with ace dressing; watch HR and Sp02 Restrictions Weight Bearing Restrictions: No      Mobility  Bed Mobility Overal bed mobility: Needs Assistance Bed Mobility: Supine to Sit;Sit to Supine     Supine to sit: Min guard;HOB elevated Sit to supine: Min guard;HOB elevated   General bed mobility comments: Use of rail and increased time.  Transfers Overall transfer level: Needs assistance Equipment used: Rolling walker (2  wheeled);None Transfers: Sit to/from [employer] to Stand: Min assist;From elevated surface Stand pivot transfers: Min assist       General transfer comment: Assist to power to standing with cues for anterior weight shift and use of momentum. Required bed elevated to stand without assist. Stood from [employer], from toilet x1. SPT bed to/from [facility] with Min A.  Ambulation/Gait Ambulation/Gait assistance: Min guard Gait Distance (Feet): 20 Feet(x2 bouts) Assistive device: Rolling walker (2 wheeled) Gait Pattern/deviations: Shuffle;Decreased stride length;Step-through pattern Gait velocity: decreased   General Gait Details: Slow, unsteady gait with RW for support; Sp02 dropped to 86% on 4L/min 02. HR ranged from 101-126 bpm A-fib. 3/4 DOE. Requires lots of time to recover, cues for pursed lip breathing.  Stairs            Wheelchair Mobility    Modified Rankin (Stroke Patients Only)       Balance Overall balance assessment: Mild deficits observed, not formally tested                                           Pertinent Vitals/Pain Pain Assessment: Faces Faces Pain Scale: Hurts a little bit Pain Location: L UE Pain Descriptors / Indicators: Sore;Tender Pain Intervention(s): Repositioned;Monitored during session    [place] expects to be discharged to:: Private residence Living Arrangements: Spouse/significant other Available Help at Discharge: (spouse recently got d/ced from the hospital) Type of Home: House Home Access: Stairs to enter Entrance Stairs-Rails: Right Entrance Stairs-Number of Steps: 6 Home Layout: One level Home Equipment:  Cane - single point;Walker - 2 wheels;Shower seat      Prior Function Level of Independence: Independent with assistive device(s)         Comments: Uses SPC for community ambulation. Drives.     Hand Dominance   Dominant Hand: Right    Extremity/Trunk Assessment    Upper Extremity Assessment Upper Extremity Assessment: Defer to OT evaluation LUE Deficits / Details: edemtous L hand/foreram, ace dressing. Digit flexion impaired LUE Sensation: decreased light touch LUE Coordination: decreased fine motor;decreased gross motor    Lower Extremity Assessment Lower Extremity Assessment: Generalized weakness       Communication   Communication: HOH  Cognition Arousal/Alertness: Awake/alert Behavior During Therapy: WFL for tasks assessed/performed Overall Cognitive Status: Within Functional Limits for tasks assessed                                        General Comments General comments (skin integrity, edema, etc.): HR ranged from 75-126 bpm; Sp02 dropped to 86% at first on 4L/min 02 but then after increasing activity, dropped to 83%.    Exercises     Assessment/Plan    PT Assessment Patient needs continued PT services  PT Problem List Decreased strength;Decreased mobility;Decreased safety awareness;Decreased activity tolerance;Cardiopulmonary status limiting activity;Pain;Decreased balance;Impaired sensation       PT Treatment Interventions DME instruction;Therapeutic activities;Gait training;Therapeutic exercise;Balance training;Stair training;Functional mobility training;Patient/family education    PT Goals (Current goals can be found in the Care Plan section)  Acute Rehab PT Goals Patient Stated Goal: to be able to breathe and go home PT Goal Formulation: With patient Time For Goal Achievement: 12/06/18 Potential to Achieve Goals: Good    Frequency Min 3X/week   Barriers to discharge Decreased caregiver support;Inaccessible home environment wife was recently d/ced from hospital; stairs    Co-evaluation               AM-PAC PT "6 Clicks" Mobility  Outcome Measure Help needed turning from your back to your side while in a flat bed without using bedrails?: A Little Help needed moving from lying on your back to  sitting on the side of a flat bed without using bedrails?: A Little Help needed moving to and from a bed to a chair (including a wheelchair)?: A Little Help needed standing up from a chair using your arms (e.g., wheelchair or bedside chair)?: A Little Help needed to walk in hospital room?: A Little Help needed climbing 3-5 steps with a railing? : A Lot 6 Click Score: 17    End of Session Equipment Utilized During Treatment: Oxygen;Gait belt Activity Tolerance: Treatment limited secondary to medical complications (VHQIONG)(EX52) Patient left: in bed;with bed alarm set;with call bell/phone within reach;Other (comment)(hydrotherapy inroom) Nurse Communication: Mobility status PT Visit Diagnosis: Pain;Unsteadiness on feet (R26.81);Muscle weakness (generalized) (M62.81) Pain - Right/Left: Left Pain - part of body: Arm    Time: 8413-2440 PT Time Calculation (min) (ACUTE ONLY): 46 min   Charges:   PT Evaluation $PT Eval Moderate Complexity: 1 Mod PT Treatments $Gait Training: 8-22 mins $Therapeutic Activity: 8-22 mins        Wray Kearns, PT, DPT Acute Rehabilitation Services Pager [phone] Office [place] 11/22/2018, 12:36 PM

## 2018-11-22 NOTE — Progress Notes (Signed)
Interm Progress Note:  A/P SOB  Spontaneous Void Trial Still SOB this afternoon. Pt only got lasix 20 mg PO this AM, when he should of gotten 40 mg (home lasix). Pulled foley this AM for voiding trial. Pt is not retaining (bladder scan 32 mL), but low UOP (100 mL). Pt desatted w/ PT to 86% while ambulating on 4L [place]. This is less than home O2 of  3L [place]. Pt is +3L up since admission. 2 View CXR showed stable pleural effusions. Plan to give home lasix of 40 mg and monitor UOP. If improved dieuresis, can likely go home, this PM.

## 2018-11-23 ENCOUNTER — Inpatient Hospital Stay (HOSPITAL_COMMUNITY): Payer: Medicare Other

## 2018-11-23 ENCOUNTER — Encounter (HOSPITAL_COMMUNITY): Payer: Self-pay | Admitting: Radiology

## 2018-11-23 DIAGNOSIS — J9621 Acute and chronic respiratory failure with hypoxia: Secondary | ICD-10-CM

## 2018-11-23 LAB — BASIC METABOLIC PANEL
Anion gap: 10 (ref 5–15)
BUN: 12 mg/dL (ref 8–23)
CO2: 28 mmol/L (ref 22–32)
Calcium: 8.6 mg/dL — ABNORMAL LOW (ref 8.9–10.3)
Chloride: 100 mmol/L (ref 98–111)
Creatinine, Ser: 0.82 mg/dL (ref 0.61–1.24)
GFR calc Af Amer: >60 mL/min (ref >60)
GFR calc non Af Amer: >60 mL/min (ref >60)
Glucose, Bld: 133 mg/dL — ABNORMAL HIGH (ref 70–99)
Potassium: 3.7 mmol/L (ref 3.5–5.1)
Sodium: 138 mmol/L (ref 135–145)

## 2018-11-23 LAB — GLUCOSE, CAPILLARY
Glucose-Capillary: 123 mg/dL — ABNORMAL HIGH (ref 70–99)
Glucose-Capillary: 126 mg/dL — ABNORMAL HIGH (ref 70–99)
Glucose-Capillary: 122 mg/dL — ABNORMAL HIGH (ref 70–99)
Glucose-Capillary: 131 mg/dL — ABNORMAL HIGH (ref 70–99)

## 2018-11-23 LAB — CBC
HCT: 32.4 % — ABNORMAL LOW (ref 39.0–52.0)
Hemoglobin: 10.5 g/dL — ABNORMAL LOW (ref 13.0–17.0)
MCH: 28.9 pg (ref 26.0–34.0)
MCHC: 32.4 g/dL (ref 30.0–36.0)
MCV: 89.3 fL (ref 80.0–100.0)
Platelets: 221 10*3/uL (ref 150–400)
RBC: 3.63 MIL/uL — ABNORMAL LOW (ref 4.22–5.81)
RDW: 14.4 % (ref 11.5–15.5)
WBC: 4.9 10*3/uL (ref 4.0–10.5)
nRBC: 0 % (ref 0.0–0.2)

## 2018-11-23 LAB — BLOOD GAS, ARTERIAL
Acid-Base Excess: 5 mmol/L — ABNORMAL HIGH (ref 0.0–2.0)
Bicarbonate: 29.1 mmol/L — ABNORMAL HIGH (ref 20.0–28.0)
Drawn by: 213381
O2 Content: 4 L/min
O2 Saturation: 97.3 %
Patient temperature: 98.6
pCO2 arterial: 44.2 mmHg (ref 32.0–48.0)
pH, Arterial: 7.434 (ref 7.350–7.450)
pO2, Arterial: 92.5 mmHg (ref 83.0–108.0)

## 2018-11-23 MED ORDER — METOPROLOL TARTRATE 100 MG PO TABS
100.0000 mg | ORAL_TABLET | Freq: Two times a day (BID) | ORAL | Status: DC
  Administered 2018-11-23 – 2018-11-26 (×6): 100 mg via ORAL
  Filled 2018-11-23 (×6): qty 1

## 2018-11-23 MED ORDER — OXYCODONE HCL 5 MG PO TABS
5.0000 mg | ORAL_TABLET | ORAL | Status: DC | PRN
  Administered 2018-11-23 – 2018-11-26 (×7): 5 mg via ORAL
  Filled 2018-11-23 (×7): qty 1

## 2018-11-23 MED ORDER — METOPROLOL TARTRATE 50 MG PO TABS
50.0000 mg | ORAL_TABLET | Freq: Once | ORAL | Status: AC
  Administered 2018-11-23: 50 mg via ORAL
  Filled 2018-11-23: qty 1

## 2018-11-23 MED ORDER — IOHEXOL 350 MG/ML SOLN
80.0000 mL | Freq: Once | INTRAVENOUS | Status: AC | PRN
  Administered 2018-11-23: 80 mL via INTRAVENOUS

## 2018-11-23 MED ORDER — HEPARIN (PORCINE) 25000 UT/250ML-% IV SOLN
1600.0000 [IU]/h | INTRAVENOUS | Status: DC
  Administered 2018-11-23 – 2018-11-24 (×2): 1550 [IU]/h via INTRAVENOUS
  Administered 2018-11-25: 1600 [IU]/h via INTRAVENOUS
  Filled 2018-11-23 (×3): qty 250

## 2018-11-23 MED ORDER — METOPROLOL TARTRATE 50 MG PO TABS: 100.0000 mg | ORAL_TABLET | Freq: Two times a day (BID) | ORAL | Status: DC

## 2018-11-23 MED ORDER — CALCIUM CARBONATE ANTACID 500 MG PO CHEW
1.0000 | CHEWABLE_TABLET | ORAL | Status: DC | PRN
  Administered 2018-11-23 – 2018-11-24 (×2): 200 mg via ORAL
  Filled 2018-11-23 (×2): qty 1

## 2018-11-23 NOTE — Progress Notes (Signed)
FPTS Interim Progress Note  CTA showing acute thrombus.  Given these findings will start patient on heparin drip.  We will also discontinue Pradaxa given that patient is on heparin drip.  We will still plan on consulting cardiology.  O: BP (!) 175/64 (BP Location: Right Arm)   Pulse (!) 114   Temp 98.2 F (36.8 C) (Oral)   Resp 14   Ht 6' (1.829 m)   Wt 94.8 kg   SpO2 96%   BMI 28.34 kg/m    Caroline More, DO 11/23/2018, 1:16 PM PGY-2, [place] Service pager [phone]

## 2018-11-23 NOTE — Plan of Care (Signed)
  Problem: Skin Integrity: Goal: Skin integrity will improve Outcome: Progressing   Problem: Clinical Measurements: Goal: Respiratory complications will improve Outcome: Progressing   Problem: Nutrition: Goal: Adequate nutrition will be maintained Outcome: Progressing

## 2018-11-23 NOTE — Progress Notes (Signed)
Pt sustained HR between 80's and 100's during the night. Continues to be on 4L O2. Breathing status improved during the night except on exertion when had to use bedside commode.

## 2018-11-23 NOTE — Progress Notes (Signed)
Pt has not voided all day NT bladder scanned had 37ml of urine in bladder. FM paged awaiting call back for further instructions

## 2018-11-23 NOTE — Progress Notes (Signed)
Family Medicine Teaching Service Daily Progress Note Intern Pager: [phone]  Patient name: [name] Medical record number: [id number] Date of birth: [birth year] Age: 85 y.o. Gender: male  Primary Care Provider: Nicoletta Dress, MD Consultants: none Code Status: Full  Pt Overview and Major Events to Date:  6/13 - admitted with L arm cellulitis 6/14 - I+D of Left forearm abscess 6/17 - hydrotherapy  Assessment and Plan: YOCHANAN EDDLEMAN is a 85 y.o. male presenting with L arm redness. PMH is significant for DM, Afib on pradaxa, aortic stenosis s/p TAVR, PVD, CAD s/p CABG, HTN, COPD, HLD, HFpEF, GERD.  Decompensated heart failure likely secondary to excessive volume from IV antibiotics Chest x-ray showing stable small bilateral pleural effusions on 6/19. Still requiring 4-5L of O2 [place] to breathe comfortably. O2 sats good and respiratory rate normal. U/O of 954mL on 6/20.  Net +2.4 L for hospital admission so far. Physical exam shows continued improving LE edema and no noticeable JVD today. We will continue diuresis with IV Lasix. Due to frequent I/O caths, an indwelling Foley catheter was placed. - Cont oral Lasix home dose today 40mg  once daily; his listed home dose was 20mg  BID but he states he was taking 40mg  at home - Considering other contributing factors to his increased work of breathing; ordering chest CTA to rule out PE - ABG to ensure he is not developing respiratory acidosis - Strict I's and O's - Daily weights   L arm cellulitis. S/p incision and drainage in the OR on 1/30 without complication.  Hydrotherapy performed on 6/18.  Ortho is noting improvement in the wound and no further need for I&D.  Will convert to p.o. antibiotics.  He will follow-up with orthopedics outpatient for further wound care. -DC Ancef (6/13-6/14) -DC vanc (6/14-6/16 due to concern for potential kidney injury -Unasyn (6/14 - ) - Augmentin ( started 6/18-26) for total of 14 days of  treatment -Tylenol 650 every 6 hours scheduled -Oxycodone 5 mg every 6 hours scheduled -morphine 2 mg every 4 hours as needed -Dilaudid 1 mg given for hydrotherapy -OT - outpatient follow up  A Fib.   Stable, poor rate control over the last 24 hours. With rates consistently above 100 - 130s. - IV Metop for rates over 140 -Pradaxa restarted on 6/18 -metoprolol 100mg  BID  Diabetes.  Last A1c viewable is 6.1 on 09/13/2017.  -holding metformin 500 mg daily -Monitor CBGs in the setting of acute infection  PVD, CAD s/p CABG, HLD -Continue home atorvastatin   GERD -Pantoprazole 40 mg daily  HFpEF, HTN  Stable - Lasix as above - terazosin 5 mg daily - metoprolol as per above  COPD. On incruse and and duonebs at home. -Incruse daily -Duo nebs in a.m. and p.m. - Adding additional nebulizer treatment today around noon.  Anemia, iron deficiency -Iron supplements  Allergies -Loratadine 10 mg daily  FEN/GI: Heart healthy carb modified Prophylaxis: Heparin   Disposition: 1 additional day of hospitalization anticipated prior to discharge home  Subjective:  Patient this morning still wants to go home but is still laboring to breath. He denies any chest pain or SOB but states it is still more difficult to breath than usual. He felt better after I increased his oxygen requirement to 5L. No complaints about his left arm this morning.  Objective: Temp:  [98.2 F (36.8 C)-98.6 F (37 C)] 98.2 F (36.8 C) (06/20 1451) Pulse Rate:  [104-120] 114 (06/20 2219) Resp:  [18-22] 18 (06/20 1451)  BP: (133-166)/(59-78) 165/66 (06/20 1451) SpO2:  [95 %-99 %] 97 % (06/20 1946) Physical Exam: Gen: Alert and Oriented x 3, NAD HEENT: Normocephalic, atraumatic Neck: no JVD CV: Irregularly irregular, no murmurs, normal S1, S2 split Resp: decreased breath sounds diffusely, no wheezing, LLL crackles Abd: non-distended, non-tender, soft, +bs in all four quadrants MSK: left arm wrapped in  coban, moving left arm, gross sensation intact Ext: no clubbing,  Cyanosis, +1-2 bilateral lower extremity edema Skin: warm, dry, intact, no rashes   Laboratory: Recent Labs  Lab 11/21/18 0402 11/22/18 0330 11/23/18 0413  WBC 5.4 5.6 4.9  HGB 9.8* 10.1* 10.5*  HCT 30.9* 31.5* 32.4*  PLT 152 175 221   Recent Labs  Lab 11/21/18 0402 11/22/18 0330 11/23/18 0413  NA 139 135 138  K 3.7 3.5 3.7  CL 103 100 100  CO2 28 28 28   BUN 12 13 12   CREATININE 0.82 0.84 0.82  CALCIUM 8.4* 8.2* 8.6*  GLUCOSE 129* 140* 133*    Imaging/Diagnostic Tests: Dg Chest 2 View  Result Date: 11/22/2018 CLINICAL DATA:  Hypoxemia EXAM: CHEST - 2 VIEW COMPARISON:  11/20/2018, 08/25/2018 FINDINGS: Post sternotomy changes and valve prosthesis. Small bilateral pleural effusions without significant change. Cardiomegaly with vascular congestion and diffuse interstitial and ground-glass opacity suspect for edema. Bibasilar consolidations. Aortic atherosclerosis. No pneumothorax. IMPRESSION: No significant interval change in cardiomegaly, vascular congestion, pulmonary edema and small bilateral effusions. Bibasilar atelectasis or pneumonia, also no significant change. Electronically Signed   By: Donavan Foil M.D.   On: 11/22/2018 16:36    [name], DO 11/23/2018, 6:58 AM PGY-2, [place] Intern pager: [phone], text pages welcome

## 2018-11-23 NOTE — Progress Notes (Addendum)
ANTICOAGULATION CONSULT NOTE - Initial Consult  Pharmacy Consult for IV heparin Indication: pulmonary embolus  Allergies  Allergen Reactions  . Bactrim [Sulfamethoxazole-Trimethoprim] Other (See Comments)    "does not do anything for me"  . Levaquin [Levofloxacin In D5w] Diarrhea    Patient Measurements: Height: 6' (182.9 cm) Weight: 208 lb 15.9 oz (94.8 kg) IBW/kg (Calculated) : 77.6 Heparin Dosing Weight: actual body weight   Vital Signs: Temp: 98.1 F (36.7 C) (06/21 1350) Temp Source: Oral (06/21 1350) BP: 158/64 (06/21 1350) Pulse Rate: 81 (06/21 1350)  Labs: Recent Labs    11/21/18 0402 11/22/18 0330 11/22/18 0619 11/22/18 0935 11/23/18 0413  HGB 9.8* 10.1*  --   --  10.5*  HCT 30.9* 31.5*  --   --  32.4*  PLT 152 175  --   --  221  CREATININE 0.82 0.84  --   --  0.82  TROPONINI  --   --  0.08* 0.07*  --     Estimated Creatinine Clearance: 78.7 mL/min (by C-G formula based on SCr of 0.82 mg/dL).   Medical History: Past Medical History:  Diagnosis Date  . Acute myocardial infarction, unspecified site, initial episode of care   . Anemia   . Anxiety   . Aortic stenosis    . Atrial fibrillation ([place])    a. permanent, on Coumadin for anticoagulation  . CAD (coronary artery disease)    a. s/p CABG in 1994 b. cath in 2015 showing normal LM, 100% LCx and RCA stenosis with 50-60% prox LAD stenosis and 100% mid-LAD stenosis; patent sequential SVG-OM2-OM3 and patent LIMA-LAD with PTCA of distal LAD via LIMA graft performed at that time  . Carotid stenosis   . CHF (congestive heart failure) ([place])   . COPD (chronic obstructive pulmonary disease) ([place])   . Depression   . Diabetes mellitus without complication ([place])    FASTING 98-120S  . GERD (gastroesophageal reflux disease)   . History of blood transfusion   . History of peptic ulcer disease   . Hyperlipidemia   . Hypertension   . Myocardial infarction ([place])    X2  . Peripheral vascular disease ([place])   .  Pneumonia    HX OF  . Renal artery stenosis ([place])   . Sleep apnea    OXYGEN AT NIGHT 2L [place]    Assessment: 89 y/oM with PMH of a-fib on Pradaxa admitted for L arm cellulitis. CTa ordered today to rule out PE. CTa showed tiny peripheral linear filling defect over a distal right lower lobar pulmonary artery which may be due to acute thrombus versus sequelae from chronic thrombus. Pharmacy consulted for heparin dosing. Last dose of Pradaxa 150mg  given at 0823 this morning. CBC reveals stable Hgb of 10.5, Pltc WNL.    Goal of Therapy:  Heparin level 0.3-0.7 units/ml Monitor platelets by anticoagulation protocol: Yes   Plan:   Per discussion with Dr. Pilar Plate, will start IV heparin 12 hours after last Pradaxa dose.  At 2030 tonight, start heparin infusion at 1550 units/hr  Heparin level 8 hours after infusion initiated  Daily CBC, heparin level  Monitor closely for s/sx of bleeding    Lindell Spar, PharmD, BCPS Clinical Pharmacist  11/23/2018 2:22 PM

## 2018-11-23 NOTE — Progress Notes (Signed)
I called to check on patient. He feels better with his breathing. I discussed CTA report with him and need for anticoagulation. He verbalized understanding and agree with the plan.

## 2018-11-24 ENCOUNTER — Inpatient Hospital Stay (HOSPITAL_COMMUNITY): Payer: Medicare Other

## 2018-11-24 DIAGNOSIS — I342 Nonrheumatic mitral (valve) stenosis: Secondary | ICD-10-CM

## 2018-11-24 DIAGNOSIS — I2699 Other pulmonary embolism without acute cor pulmonale: Secondary | ICD-10-CM

## 2018-11-24 DIAGNOSIS — I361 Nonrheumatic tricuspid (valve) insufficiency: Secondary | ICD-10-CM

## 2018-11-24 LAB — BASIC METABOLIC PANEL
Anion gap: 8 (ref 5–15)
BUN: 12 mg/dL (ref 8–23)
CO2: 30 mmol/L (ref 22–32)
Calcium: 8.6 mg/dL — ABNORMAL LOW (ref 8.9–10.3)
Chloride: 100 mmol/L (ref 98–111)
Creatinine, Ser: 0.8 mg/dL (ref 0.61–1.24)
GFR calc Af Amer: >60 mL/min (ref >60)
GFR calc non Af Amer: >60 mL/min (ref >60)
Glucose, Bld: 113 mg/dL — ABNORMAL HIGH (ref 70–99)
Potassium: 3.7 mmol/L (ref 3.5–5.1)
Sodium: 138 mmol/L (ref 135–145)

## 2018-11-24 LAB — CBC
HCT: 32 % — ABNORMAL LOW (ref 39.0–52.0)
Hemoglobin: 10.5 g/dL — ABNORMAL LOW (ref 13.0–17.0)
MCH: 29.3 pg (ref 26.0–34.0)
MCHC: 32.8 g/dL (ref 30.0–36.0)
MCV: 89.4 fL (ref 80.0–100.0)
Platelets: 218 10*3/uL (ref 150–400)
RBC: 3.58 MIL/uL — ABNORMAL LOW (ref 4.22–5.81)
RDW: 14.6 % (ref 11.5–15.5)
WBC: 4.9 10*3/uL (ref 4.0–10.5)
nRBC: 0 % (ref 0.0–0.2)

## 2018-11-24 LAB — GLUCOSE, CAPILLARY
Glucose-Capillary: 106 mg/dL — ABNORMAL HIGH (ref 70–99)
Glucose-Capillary: 149 mg/dL — ABNORMAL HIGH (ref 70–99)
Glucose-Capillary: 169 mg/dL — ABNORMAL HIGH (ref 70–99)

## 2018-11-24 LAB — ECHOCARDIOGRAM COMPLETE
Height: 72 in
Weight: 3273.6 oz

## 2018-11-24 LAB — HEPARIN LEVEL (UNFRACTIONATED)
Heparin Unfractionated: 0.36 IU/mL (ref 0.30–0.70)
Heparin Unfractionated: 0.31 IU/mL (ref 0.30–0.70)

## 2018-11-24 MED ORDER — IPRATROPIUM-ALBUTEROL 0.5-2.5 (3) MG/3ML IN SOLN: 3.0000 mL | Freq: Four times a day (QID) | RESPIRATORY_TRACT | Status: DC

## 2018-11-24 MED ORDER — FUROSEMIDE 10 MG/ML IJ SOLN
40.0000 mg | Freq: Every day | INTRAMUSCULAR | Status: DC
  Administered 2018-11-24 – 2018-11-25 (×2): 40 mg via INTRAVENOUS
  Filled 2018-11-24 (×2): qty 4

## 2018-11-24 MED ORDER — SODIUM CHLORIDE 0.9% FLUSH
10.0000 mL | Freq: Two times a day (BID) | INTRAVENOUS | Status: DC
  Administered 2018-11-24 – 2018-11-25 (×2): 10 mL

## 2018-11-24 MED ORDER — PREDNISONE 20 MG PO TABS
40.0000 mg | ORAL_TABLET | Freq: Every day | ORAL | Status: DC
  Administered 2018-11-24 – 2018-11-26 (×3): 40 mg via ORAL
  Filled 2018-11-24 (×3): qty 2

## 2018-11-24 MED ORDER — SODIUM CHLORIDE 0.9% FLUSH: 10.0000 mL | INTRAVENOUS | Status: DC | PRN

## 2018-11-24 MED ORDER — IPRATROPIUM-ALBUTEROL 0.5-2.5 (3) MG/3ML IN SOLN
3.0000 mL | Freq: Four times a day (QID) | RESPIRATORY_TRACT | Status: DC
  Administered 2018-11-24 – 2018-11-25 (×3): 3 mL via RESPIRATORY_TRACT
  Filled 2018-11-24 (×4): qty 3

## 2018-11-24 NOTE — Progress Notes (Signed)
Patient has not voided since RN started shift.  Bladder scan result 279mL.  RN assisted patient to stand beside bed to attempt to urinate in urinal and patient also sat on the side of the bed and was unable to urinate.  I and O cath completed with 382mL of urine out.

## 2018-11-24 NOTE — Progress Notes (Signed)
Physical Therapy Wound Treatment Patient Details  Name: [name] MRN: [id number] Date of Birth: 05-Jun-1931  Today's Date: 11/24/2018 Time: 1200-1239 Time Calculation (min): 39 min  Subjective  Patient and Family Stated Goals: Healed up so I can use it. Prior Treatments: unknown  Pain Score:  6/10  premedicated  Wound Assessment  Wound / Incision (Open or Dehisced) 11/19/18 Other (Comment) Arm Left left dorsal cellulitic wound post I and D (Active)  Wound Image   11/19/18 1654  Dressing Type Compression wrap;Gauze (Comment);Impregnated gauze (bismuth);Moist to dry 11/24/18 1315  Dressing Changed New 11/19/18 1654  Dressing Status Clean;Dry;Intact 11/24/18 1315  Dressing Change Frequency Daily 11/24/18 1315  Site / Wound Assessment Purple;Pink 11/24/18 1315  % Wound base Red or Granulating 90% 11/24/18 1315  % Wound base Yellow/Fibrinous Exudate 10% 11/24/18 1315  % Wound base Black/Eschar 0% 11/24/18 1315  Peri-wound Assessment Erythema (blanchable);Edema;Purple;Pink 11/24/18 1315  Wound Length (cm) 3 cm 11/19/18 1654  Wound Width (cm) 3 cm 11/19/18 1654  Wound Depth (cm) 0.3 cm 11/19/18 1654  Wound Volume (cm^3) 2.7 cm^3 11/19/18 1654  Wound Surface Area (cm^2) 9 cm^2 11/19/18 1654  Undermining (cm) 12*  2 cm 11/19/18 1654  Margins Unattached edges (unapproximated) 11/24/18 1315  Drainage Amount Minimal 11/24/18 1315  Drainage Description Serosanguineous;Serous 11/24/18 1315  Treatment Cleansed;Debridement (Selective);Hydrotherapy (Pulse lavage);Packing (Saline gauze) 11/24/18 1315     Wound / Incision (Open or Dehisced) 11/19/18 Other (Comment) Arm Left ventral forearm wound post I and D (Active)  Wound Image   11/19/18 1654  Dressing Type Compression wrap;Gauze (Comment);Impregnated gauze (bismuth);Moist to dry;Impregnated gauze (petrolatum) 11/24/18 1315  Dressing Changed Changed 11/24/18 1315  Dressing Status Clean;Intact 11/24/18 1315  Dressing Change Frequency  Daily 11/24/18 1315  Site / Wound Assessment Clean;Dry;Painful;Red 11/24/18 1315  % Wound base Red or Granulating 60% 11/24/18 1315  % Wound base Yellow/Fibrinous Exudate 10% 11/24/18 1315  % Wound base Other/Granulation Tissue (Comment) 30% 11/24/18 1315  Peri-wound Assessment Other (Comment);Edema;Erythema (blanchable);Purple 11/24/18 1315  Wound Length (cm) 21 cm 11/19/18 1654  Wound Width (cm) 4 cm 11/19/18 1654  Wound Depth (cm) 1 cm 11/19/18 1654  Wound Volume (cm^3) 84 cm^3 11/19/18 1654  Wound Surface Area (cm^2) 84 cm^2 11/19/18 1654  Tunneling (cm) 4  @ 6 * 11/19/18 1654  Drainage Amount Minimal 11/24/18 1315  Drainage Description Serosanguineous 11/24/18 1315  Treatment Cleansed;Debridement (Selective);Hydrotherapy (Pulse lavage);Packing (Saline gauze) 11/24/18 1315      Hydrotherapy Pulsed lavage therapy - wound location: l forearm dorsum and ventral surfaces due to cellulitis from striking the back of his arm.   Pulsed Lavage with Suction (psi): 4 psi Pulsed Lavage with Suction - Normal Saline Used: 1000 mL Pulsed Lavage Tip: Tip with splash shield Selective Debridement Selective Debridement - Location: L forearm Selective Debridement - Tools Used: Forceps;Scissors;Scalpel Selective Debridement - Tissue Removed: Necrotic trash and other tissues.   Wound Assessment and Plan  Wound Therapy - Assess/Plan/Recommendations Wound Therapy - Clinical Statement: This wound can benefit from PLS to decrease bacterial load and soften up some tissues for selective debriding.  Plus we will use a hydrogel or adaptic to keep the tendon moist. Wound Therapy - Functional Problem List: stiff sore L finger/hand movement Factors Delaying/Impairing Wound Healing: Infection - systemic/local Hydrotherapy Plan: Debridement;Dressing change;Patient/family education;Pulsatile lavage with suction Wound Therapy - Frequency: 6X / week Wound Therapy - Current Recommendations: Case manager/social work  Wound Therapy - Follow Up Recommendations: Home health RN Wound Plan: see above  Wound Therapy Goals- Improve the function of patient's integumentary system by progressing the wound(s) through the phases of wound healing (inflammation - proliferation - remodeling) by: Decrease Necrotic Tissue to: 5% Decrease Necrotic Tissue - Progress: Progressing toward goal Increase Granulation Tissue to: 95 including any healthy tissues Increase Granulation Tissue - Progress: Progressing toward goal Goals/treatment plan/discharge plan were made with and agreed upon by patient/family: Yes Time For Goal Achievement: 7 days Wound Therapy - Potential for Goals: Good  Goals will be updated until maximal potential achieved or discharge criteria met.  Discharge criteria: when goals achieved, discharge from hospital, MD decision/surgical intervention, no progress towards goals, refusal/missing three consecutive treatments without notification or medical reason.  GP     [name] 11/24/2018, 1:21 PM 11/24/2018  Donnella Sham, PT Acute Rehabilitation Services [phone]  (pager) [phone]  (office)

## 2018-11-24 NOTE — Progress Notes (Signed)
RN text paged Family Medicine due to patient complaining of increased shortness of breath.  RN's previous assessment patient was using mild accessory muscle use, at this time moderate accessory muscle use noted.  Oxygen saturation 98% on 4L nasal cannula.  On call MD stated she would come to bedside and see patient.

## 2018-11-24 NOTE — Care Management Important Message (Signed)
Important Message  Patient Details  Name: [name] MRN: [id number] Date of Birth: [birth year]   Medicare Important Message Given:  Yes     [name] 11/24/2018, 1:19 PM

## 2018-11-24 NOTE — Progress Notes (Signed)
Family Medicine Teaching Service Daily Progress Note Intern Pager: [phone]  Patient name: [name] Medical record number: [id number] Date of birth: 1932/06/04 Age: 85 y.o. Gender: male  Primary Care Provider: Nicoletta Dress, MD Consultants: none Code Status: Full  Pt Overview and Major Events to Date:  6/13 - admitted with L arm cellulitis 6/14 - I+D of Left forearm abscess 6/17 - hydrotherapy  Assessment and Plan: [name] is a 85 y.o. male presenting with L arm redness. PMH is significant for DM, Afib on pradaxa, aortic stenosis s/p TAVR, PVD, CAD s/p CABG, HTN, COPD, HLD, HFpEF, GERD.  Pulmonary embolism CTA chest performed on 6/21+ for pulmonary embolism.  Heparin started 11/2110 hours following last dose of Pradaxa.  Will transition to NOAC or once his breathing status is stabilized.  He continues to demonstrate respiratory distress on exam. -Heparin per pharmacy -Transition to dabigatran after 5 days of heparin  Decompensated heart failure likely secondary to excessive volume from IV antibiotics 300 cc UOP on 6/21.  1.4 L positive from admission.  2.8 kg increased weight from admission.  He is demonstrated poor diuresis back on his home regimen.  We will increase his Lasix regimen today and continue to monitor his respiratory status. -Home regimen Lasix p.o. 40 mg -Start Lasix IV 40 mg - Strict I's and O's - Daily weights  COPD.  Poor air movement in all fields on lung exam today with minimal wheezing and middle fields.  We will begin treatment for COPD exacerbation. -Prednisone 40 mg (6/22-6/26) -Incruse daily -Scheduled duo nebs every 8 hours -Albuterol nebs every 4 hours as needed  L arm cellulitis. S/p incision and drainage in the OR on 0/24 without complication.  Hydrotherapy performed on 6/18.  Ortho is noting improvement in the wound and no further need for I&D.  Will convert to p.o. antibiotics.  He will follow-up with orthopedics outpatient for  further wound care. -DC Ancef (6/13-6/14) -DC vanc (6/14-6/16 due to concern for potential kidney injury -Unasyn (6/14 - ) - Augmentin ( started 6/18-26) for total of 14 days of treatment -Tylenol 650 every 6 hours scheduled -Oxycodone 5 mg every 6 hours scheduled -morphine 2 mg every 4 hours as needed -Dilaudid 1 mg given for hydrotherapy -OT - outpatient follow up  A Fib.   Stable, poor rate control over the last 24 hours. With rates consistently above 100 - 130s. - IV Metop for rates over 140 -Pradaxa restarted on 6/18 -metoprolol 100mg  BID  Diabetes.  Last A1c viewable is 6.1 on 09/13/2017.  -holding metformin 500 mg daily -Monitor CBGs in the setting of acute infection  PVD, CAD s/p CABG, HLD -Continue home atorvastatin   GERD -Pantoprazole 40 mg daily  HFpEF, HTN  Stable - Lasix as above - terazosin 5 mg daily - metoprolol as per above  Anemia, iron deficiency -Iron supplements  Allergies -Loratadine 10 mg daily  FEN/GI: Heart healthy carb modified Prophylaxis: Heparin   Disposition: 1 additional day of hospitalization anticipated prior to discharge home  Subjective:  No acute events overnight.  He reports that he is breathing has been very labored for the past 2 days.  He also reports that he is had continued pain of his left arm between 8-10/10.  Objective: Temp:  [97.5 F (36.4 C)-98.2 F (36.8 C)] 97.7 F (36.5 C) (06/22 0511) Pulse Rate:  [67-114] 90 (06/22 0637) Resp:  [14-16] 16 (06/22 0637) BP: (106-175)/(46-73) 158/51 (06/22 0511) SpO2:  [96 %-100 %] 98 % (  06/22 8786) Weight:  [91.4 kg-92.8 kg] 92.8 kg (06/22 0559) Physical Exam: General: Cute and cooperative.  Moderate respiratory distress on exam today.  Is receiving a breathing treatment at the time of the interview.  He reported that his not been breathing comfortably for the past 2 days. HEENT: No appreciable JVD Cardio: Normal S1 and S2, no S3 or S4. Rhythm is regular. No murmurs or  rubs.   Pulm: Poor air movement diffusely, worse in the lower lobes.  Minimal wheezing noted in middle fields bilaterally. Abdomen: Bowel sounds normal. Abdomen soft and non-tender.  Extremities: 3+ pitting edema in the lower extremities.  Erythema receding from demarcations in left upper arm.  Good movement of the digits of his left hand.. Neuro: Cranial nerves grossly intact   Laboratory: Recent Labs  Lab 11/22/18 0330 11/23/18 0413 11/24/18 0358  WBC 5.6 4.9 4.9  HGB 10.1* 10.5* 10.5*  HCT 31.5* 32.4* 32.0*  PLT 175 221 218   Recent Labs  Lab 11/22/18 0330 11/23/18 0413 11/24/18 0358  NA 135 138 138  K 3.5 3.7 3.7  CL 100 100 100  CO2 28 28 30   BUN 13 12 12   CREATININE 0.84 0.82 0.80  CALCIUM 8.2* 8.6* 8.6*  GLUCOSE 140* 133* 113*    Imaging/Diagnostic Tests: Ct Angio Chest Pe W Or Wo Contrast  Result Date: 11/23/2018 CLINICAL DATA:  Ongoing shortness of breath. Recent left arm surgery. EXAM: CT ANGIOGRAPHY CHEST WITH CONTRAST TECHNIQUE: Multidetector CT imaging of the chest was performed using the standard protocol during bolus administration of intravenous contrast. Multiplanar CT image reconstructions and MIPs were obtained to evaluate the vascular anatomy. CONTRAST:  51mL OMNIPAQUE IOHEXOL 350 MG/ML SOLN COMPARISON:  08/22/2017 FINDINGS: Cardiovascular: Moderate stable cardiomegaly. Calcification of the mitral valve annulus. Interval TAVR. Calcification over the left main and 3 vessel coronary arteries. Calcified plaque over the thoracic aorta. Pulmonary arterial system is well opacified. There is subtle peripheral linear filling defect over a distal right lower lobar pulmonary artery which may be sequelae from chronic thrombus although small acute thrombus is possible. Mediastinum/Nodes: No significant mediastinal or hilar adenopathy. Few shoddy mediastinal lymph nodes are present. Remaining mediastinal structures are unremarkable. Lungs/Pleura: Lungs are adequately  inflated demonstrate moderate size bilateral pleural effusions with associated compressive atelectasis in the lung bases. Linear atelectasis over the lingula. Airways are unremarkable. Upper Abdomen: Calcified plaque over the abdominal aorta. Tiny amount of perihepatic fluid is present. Musculoskeletal: Degenerative change of the spine. Stable mild T12 compression fracture. Review of the MIP images confirms the above findings. IMPRESSION: Tiny peripheral linear filling defect over a distal right lower lobar pulmonary artery which may be due to acute thrombus versus sequelae from chronic thrombus. Moderate size bilateral pleural effusions with associated bibasilar compressive atelectasis. Linear atelectasis over the lingula. Cardiomegaly. Atherosclerotic coronary artery disease. Evidence of TAVR. Aortic Atherosclerosis (ICD10-I70.0). Stable T12 compression fracture. These results will be called to the ordering clinician or representative by the Radiologist Assistant, and communication documented in the PACS or zVision Dashboard. Electronically Signed   By: Marin Olp M.D.   On: 11/23/2018 13:01    Matilde Haymaker, MD 11/24/2018, 6:47 AM PGY-2, [place] Intern pager: [phone], text pages welcome

## 2018-11-24 NOTE — Progress Notes (Signed)
Echocardiogram 2D Echocardiogram has been performed.  [name] 11/24/2018, 9:30 AM

## 2018-11-24 NOTE — Progress Notes (Signed)
[place] for Heparin Indication: pulmonary embolus  Allergies  Allergen Reactions  . Bactrim [Sulfamethoxazole-Trimethoprim] Other (See Comments)    "does not do anything for me"  . Levaquin [Levofloxacin In D5w] Diarrhea    Patient Measurements: Height: 6' (182.9 cm) Weight: 204 lb 9.6 oz (92.8 kg) IBW/kg (Calculated) : 77.6 Heparin Dosing Weight: actual body weight   Vital Signs: Temp: 97.9 F (36.6 C) (06/22 0825) Temp Source: Oral (06/22 0825) BP: 158/51 (06/22 0511) Pulse Rate: 104 (06/22 1108)  Labs: Recent Labs    11/22/18 0330 11/22/18 0867 11/22/18 0935 11/23/18 0413 11/24/18 0358 11/24/18 1128  HGB 10.1*  --   --  10.5* 10.5*  --   HCT 31.5*  --   --  32.4* 32.0*  --   PLT 175  --   --  221 218  --   HEPARINUNFRC  --   --   --   --  0.36 0.31  CREATININE 0.84  --   --  0.82 0.80  --   TROPONINI  --  0.08* 0.07*  --   --   --     Estimated Creatinine Clearance: 74.1 mL/min (by C-G formula based on SCr of 0.8 mg/dL).   Medical History: Past Medical History:  Diagnosis Date  . Acute myocardial infarction, unspecified site, initial episode of care   . Anemia   . Anxiety   . Aortic stenosis    . Atrial fibrillation ([place])    a. permanent, on Coumadin for anticoagulation  . CAD (coronary artery disease)    a. s/p CABG in 1994 b. cath in 2015 showing normal LM, 100% LCx and RCA stenosis with 50-60% prox LAD stenosis and 100% mid-LAD stenosis; patent sequential SVG-OM2-OM3 and patent LIMA-LAD with PTCA of distal LAD via LIMA graft performed at that time  . Carotid stenosis   . CHF (congestive heart failure) ([place])   . COPD (chronic obstructive pulmonary disease) ([place])   . Depression   . Diabetes mellitus without complication ([place])    FASTING 98-120S  . GERD (gastroesophageal reflux disease)   . History of blood transfusion   . History of peptic ulcer disease   . Hyperlipidemia   . Hypertension   . Myocardial  infarction ([place])    X2  . Peripheral vascular disease ([place])   . Pneumonia    HX OF  . Renal artery stenosis ([place])   . Sleep apnea    OXYGEN AT NIGHT 2L [place]    Assessment: 38 y/oM with PMH of a-fib on Pradaxa admitted for L arm cellulitis. CTa showed tiny peripheral linear filling defect over a distal right lower lobar pulmonary artery which may be due to acute thrombus versus sequelae from chronic thrombus. Based on acute changes in respiratory status and temporary hold on Pradaxa, most likely acute PE.   Heparin level remains therapeutic but at the lower end of normal on two consecutive readings (0.31<0.37). Plan to slightly increase rate to ensure heparin level goal 0.3-0.7. CBC reveals stable Hgb of 10.5, PLT WNL. No s/s of bleeding.  Goal of Therapy:  Heparin level 0.3-0.7 units/ml Monitor platelets by anticoagulation protocol: Yes   Plan:  Increase heparin drip to 1600 units/hr Daily CBC, heparin level Monitor closely for s/sx of bleeding  Ladoris Gene, PharmD Candidate

## 2018-11-24 NOTE — Progress Notes (Signed)
Subjective: 8 Days Post-Op Procedure(s) (LRB): INCISION AND DRAINAGE LEFT FOREARM/ARM (Left) Patient just post hydrotherapy.  Pain from hydrotherapy/packing change.  Objective: Vital signs in last 24 hours: Temp:  [97.5 F (36.4 C)-98.1 F (36.7 C)] 97.9 F (36.6 C) (06/22 0825) Pulse Rate:  [67-104] 104 (06/22 1108) Resp:  [16-20] 20 (06/22 1108) BP: (106-165)/(46-73) 158/51 (06/22 0511) SpO2:  [96 %-100 %] 99 % (06/22 1108) Weight:  [91.4 kg-92.8 kg] 92.8 kg (06/22 0559)  Intake/Output from previous day: 06/21 0701 - 06/22 0700 In: 809 [P.O.:700; I.V.:109] Out: 300 [Urine:300] Intake/Output this shift: Total I/O In: -  Out: 350 [Urine:350]  Recent Labs    11/22/18 0330 11/23/18 0413 11/24/18 0358  HGB 10.1* 10.5* 10.5*   Recent Labs    11/23/18 0413 11/24/18 0358  WBC 4.9 4.9  RBC 3.63* 3.58*  HCT 32.4* 32.0*  PLT 221 218   Recent Labs    11/23/18 0413 11/24/18 0358  NA 138 138  K 3.7 3.7  CL 100 100  CO2 28 30  BUN 12 12  CREATININE 0.82 0.80  GLUCOSE 133* 113*  CALCIUM 8.6* 8.6*   No results for input(s): LABPT, INR in the last 72 hours.  Wounds look good.  Erythema resolved.  No swelling.     Assessment/Plan: 8 Days Post-Op Procedure(s) (LRB): INCISION AND DRAINAGE LEFT FOREARM/ARM (Left) Continue hydrotherapy/wound care.  May decrease to three times per week.  Will pick up in office after d/c.   [place] Cover 11/24/2018, 12:24 PM

## 2018-11-24 NOTE — Progress Notes (Signed)
Physical Therapy Treatment Patient Details Name: [name] MRN: [id number] DOB: 09-18-1931 Today's Date: 11/24/2018    History of Present Illness 85 y.o. male admitted on 11/15/2018 with cellulitis of LUE s/p I&D 6/14 and hydro 6/18 . Also admitted with Decompensated heart failure likely secondary to excessive volume from IV antibiotics. PMH includes DM, Afib on pradaxa, aortic stenosis s/p TAVR, PVD, CAD s/p CABG, HTN, COPD, HLD, HFpEF.    PT Comments    Pt exhausted after hydrotherapy and increased bleeding from his IV site requiring him to sit up in the chair for extended period while bed and pt cleaned. Pt refused to get out of bed again, but is agreeable to LE therapeutic exercise. PT will follow back for mobility later in week. RN staff encouraged to get him OOB daily.    Follow Up Recommendations  Home health PT;Supervision for mobility/OOB;Supervision/Assistance - 24 hour     Equipment Recommendations  None recommended by PT       Precautions / Restrictions Precautions Precautions: Fall Precaution Comments: L UE edematous with ace dressing; watch HR and Sp02 Restrictions Weight Bearing Restrictions: No          Cognition Arousal/Alertness: Awake/alert Behavior During Therapy: WFL for tasks assessed/performed Overall Cognitive Status: Within Functional Limits for tasks assessed                                        Exercises General Exercises - Lower Extremity Ankle Circles/Pumps: AROM;Both;10 reps;Supine Quad Sets: AROM;Both;10 reps;Supine Gluteal Sets: AROM;Both;10 reps;Supine Heel Slides: AROM;Both;10 reps;Supine Hip ABduction/ADduction: AROM;Both;10 reps;Supine Straight Leg Raises: AROM;Both;10 reps;Supine    General Comments General comments (skin integrity, edema, etc.): Pt with R UE IV bleeding earlier in the day, and visibly pooled in elbow, pt very concerned about it      Pertinent Vitals/Pain Pain Assessment: Faces Faces Pain  Scale: Hurts a little bit Pain Location: R UE, L UE Pain Descriptors / Indicators: Grimacing;Guarding Pain Intervention(s): Limited activity within patient's tolerance;Monitored during session;Repositioned           PT Goals (current goals can now be found in the care plan section) Acute Rehab PT Goals PT Goal Formulation: With patient Time For Goal Achievement: 12/06/18 Potential to Achieve Goals: Good Progress towards PT goals: Not progressing toward goals - comment(pt only agreeable to therex today )    Frequency    Min 3X/week      PT Plan Current plan remains appropriate       AM-PAC PT "6 Clicks" Mobility   Outcome Measure  Help needed turning from your back to your side while in a flat bed without using bedrails?: A Little Help needed moving from lying on your back to sitting on the side of a flat bed without using bedrails?: A Little Help needed moving to and from a bed to a chair (including a wheelchair)?: A Little Help needed standing up from a chair using your arms (e.g., wheelchair or bedside chair)?: A Little Help needed to walk in hospital room?: A Little Help needed climbing 3-5 steps with a railing? : A Lot 6 Click Score: 17    End of Session Equipment Utilized During Treatment: Oxygen Activity Tolerance: Treatment limited secondary to medical complications (Comment);Patient limited by fatigue Patient left: in bed;with bed alarm set Nurse Communication: Mobility status PT Visit Diagnosis: Pain;Unsteadiness on feet (R26.81);Muscle weakness (generalized) (M62.81) Pain - Right/Left:  Left Pain - part of body: Arm     Time: 1510-1525 PT Time Calculation (min) (ACUTE ONLY): 15 min  Charges:  $Therapeutic Exercise: 8-22 mins                     Stefanny Pieri B. Migdalia Dk PT, DPT Acute Rehabilitation Services Pager [phone] Office [phone]    [place] 11/24/2018, 4:54 PM

## 2018-11-24 NOTE — Progress Notes (Signed)
Patient complaining of indigestion and requesting TUMS (no PRN order noted).  Patient also requesting pain medication for 6-7/10 left arm pain but due to past administrations and ordered frequency unable to administer PRN medication at this time.  RN updated Family Medicine of this information.  New orders entered per MD.

## 2018-11-24 NOTE — Progress Notes (Signed)
[place] for Heparin Indication: pulmonary embolus  Allergies  Allergen Reactions  . Bactrim [Sulfamethoxazole-Trimethoprim] Other (See Comments)    "does not do anything for me"  . Levaquin [Levofloxacin In D5w] Diarrhea    Patient Measurements: Height: 6' (182.9 cm) Weight: 208 lb 15.9 oz (94.8 kg) IBW/kg (Calculated) : 77.6 Heparin Dosing Weight: actual body weight   Vital Signs: Temp: 97.5 F (36.4 C) (06/22 0008) Temp Source: Oral (06/22 0008) BP: 129/53 (06/22 0318) Pulse Rate: 74 (06/22 0318)  Labs: Recent Labs    11/22/18 0330 11/22/18 0619 11/22/18 0935 11/23/18 0413 11/24/18 0358  HGB 10.1*  --   --  10.5* 10.5*  HCT 31.5*  --   --  32.4* 32.0*  PLT 175  --   --  221 218  HEPARINUNFRC  --   --   --   --  0.36  CREATININE 0.84  --   --  0.82 0.80  TROPONINI  --  0.08* 0.07*  --   --     Estimated Creatinine Clearance: 80.7 mL/min (by C-G formula based on SCr of 0.8 mg/dL).   Medical History: Past Medical History:  Diagnosis Date  . Acute myocardial infarction, unspecified site, initial episode of care   . Anemia   . Anxiety   . Aortic stenosis    . Atrial fibrillation ([place])    a. permanent, on Coumadin for anticoagulation  . CAD (coronary artery disease)    a. s/p CABG in 1994 b. cath in 2015 showing normal LM, 100% LCx and RCA stenosis with 50-60% prox LAD stenosis and 100% mid-LAD stenosis; patent sequential SVG-OM2-OM3 and patent LIMA-LAD with PTCA of distal LAD via LIMA graft performed at that time  . Carotid stenosis   . CHF (congestive heart failure) ([place])   . COPD (chronic obstructive pulmonary disease) ([place])   . Depression   . Diabetes mellitus without complication ([place])    FASTING 98-120S  . GERD (gastroesophageal reflux disease)   . History of blood transfusion   . History of peptic ulcer disease   . Hyperlipidemia   . Hypertension   . Myocardial infarction ([place])    X2  . Peripheral vascular  disease ([place])   . Pneumonia    HX OF  . Renal artery stenosis ([place])   . Sleep apnea    OXYGEN AT NIGHT 2L [place]    Assessment: 36 y/oM with PMH of a-fib on Pradaxa admitted for L arm cellulitis. CTa ordered today to rule out PE. CTa showed tiny peripheral linear filling defect over a distal right lower lobar pulmonary artery which may be due to acute thrombus versus sequelae from chronic thrombus. Pharmacy consulted for heparin dosing. Last dose of Pradaxa 150mg  given at 0823 this morning. CBC reveals stable Hgb of 10.5, Pltc WNL.   6/22 AM update:  Initial heparin level therapeutic  CBC stable   Goal of Therapy:  Heparin level 0.3-0.7 units/ml Monitor platelets by anticoagulation protocol: Yes   Plan:   Cont heparin at 1550 units/hr  Confirmatory heparin level at 1200  Daily CBC, heparin level  Monitor closely for s/sx of bleeding   Narda Bonds, PharmD, BCPS Clinical Pharmacist Phone: [phone]

## 2018-11-25 DIAGNOSIS — R0902 Hypoxemia: Secondary | ICD-10-CM

## 2018-11-25 LAB — CBC
HCT: 31.5 % — ABNORMAL LOW (ref 39.0–52.0)
Hemoglobin: 10.2 g/dL — ABNORMAL LOW (ref 13.0–17.0)
MCH: 29 pg (ref 26.0–34.0)
MCHC: 32.4 g/dL (ref 30.0–36.0)
MCV: 89.5 fL (ref 80.0–100.0)
Platelets: 225 10*3/uL (ref 150–400)
RBC: 3.52 MIL/uL — ABNORMAL LOW (ref 4.22–5.81)
RDW: 14.5 % (ref 11.5–15.5)
WBC: 5.1 10*3/uL (ref 4.0–10.5)
nRBC: 0 % (ref 0.0–0.2)

## 2018-11-25 LAB — GLUCOSE, CAPILLARY
Glucose-Capillary: 154 mg/dL — ABNORMAL HIGH (ref 70–99)
Glucose-Capillary: 123 mg/dL — ABNORMAL HIGH (ref 70–99)
Glucose-Capillary: 162 mg/dL — ABNORMAL HIGH (ref 70–99)
Glucose-Capillary: 186 mg/dL — ABNORMAL HIGH (ref 70–99)
Glucose-Capillary: 224 mg/dL — ABNORMAL HIGH (ref 70–99)

## 2018-11-25 LAB — HEPATITIS B SURFACE ANTIGEN: Hepatitis B Surface Ag: NEGATIVE

## 2018-11-25 LAB — HCV AB W REFLEX TO QUANT PCR: HCV Ab: 0.1 s/co ratio (ref 0.0–0.9)

## 2018-11-25 LAB — HEPARIN LEVEL (UNFRACTIONATED): Heparin Unfractionated: 0.59 IU/mL (ref 0.30–0.70)

## 2018-11-25 LAB — HCV INTERPRETATION

## 2018-11-25 MED ORDER — APIXABAN 5 MG PO TABS
10.0000 mg | ORAL_TABLET | Freq: Two times a day (BID) | ORAL | Status: DC
  Administered 2018-11-25 – 2018-11-26 (×3): 10 mg via ORAL
  Filled 2018-11-25 (×3): qty 2

## 2018-11-25 MED ORDER — APIXABAN 2.5 MG PO TABS: 2.5000 mg | ORAL_TABLET | Freq: Two times a day (BID) | ORAL | Status: DC

## 2018-11-25 MED ORDER — APIXABAN 5 MG PO TABS: 10.0000 mg | ORAL_TABLET | Freq: Two times a day (BID) | ORAL | Status: DC

## 2018-11-25 MED ORDER — IPRATROPIUM-ALBUTEROL 0.5-2.5 (3) MG/3ML IN SOLN
3.0000 mL | Freq: Three times a day (TID) | RESPIRATORY_TRACT | Status: DC
  Administered 2018-11-25 – 2018-11-26 (×5): 3 mL via RESPIRATORY_TRACT
  Filled 2018-11-25 (×5): qty 3

## 2018-11-25 MED ORDER — APIXABAN 5 MG PO TABS: 5.0000 mg | ORAL_TABLET | Freq: Two times a day (BID) | ORAL | Status: DC

## 2018-11-25 MED ORDER — FUROSEMIDE 10 MG/ML IJ SOLN
40.0000 mg | Freq: Two times a day (BID) | INTRAMUSCULAR | Status: DC
  Administered 2018-11-25 – 2018-11-26 (×2): 40 mg via INTRAVENOUS
  Filled 2018-11-25 (×2): qty 4

## 2018-11-25 NOTE — Progress Notes (Signed)
Physical Therapy Treatment Patient Details Name: [name] MRN: [id number] DOB: 04-12-1932 Today's Date: 11/25/2018    History of Present Illness 85 y.o. male admitted on 11/15/2018 with cellulitis of LUE s/p I&D 6/14 and hydro 6/18 . Also admitted with Decompensated heart failure likely secondary to excessive volume from IV antibiotics. Found to have a PE 6/21. PMH includes DM, Afib on pradaxa, aortic stenosis s/p TAVR, PVD, CAD s/p CABG, HTN, COPD, HLD, HFpEF.    PT Comments    Patient progressing well towards PT goals. Reports breathing is improved from this weekend but still gets tired and SOB with activity. Improved ambulation distance with Min guard for safety. Sp02 dropped to 87% on 4L/min 02 with 3/4 DOE. Reports feeling tired from working with OT earlier in afternoon.  Education re: energy conservation techniques, placing chairs around house for places to rest as needed, short bouts of activity with longer rest breaks etc. Will follow acutely.     Follow Up Recommendations  Home health PT;Supervision for mobility/OOB;Supervision/Assistance - 24 hour     Equipment Recommendations  None recommended by PT    Recommendations for Other Services       Precautions / Restrictions Precautions Precautions: Fall Precaution Comments: Lt UE wrapped ; watch 02 Restrictions Weight Bearing Restrictions: No    Mobility  Bed Mobility Overal bed mobility: Needs Assistance Bed Mobility: Supine to Sit;Sit to Supine     Supine to sit: Min guard;HOB elevated Sit to supine: Min guard;HOB elevated   General bed mobility comments: moves slowly.  uses handrails. Some difficulty bringing LEs into bed but able to do without assist.  Transfers Overall transfer level: Needs assistance Equipment used: Rolling walker (2 wheeled) Transfers: Sit to/from Stand Sit to Stand: Min guard;From elevated surface Stand pivot transfers: Min guard       General transfer comment: Able to stand from  elevated bed height without assist.  Ambulation/Gait Ambulation/Gait assistance: Min guard Gait Distance (Feet): 100 Feet Assistive device: Rolling walker (2 wheeled) Gait Pattern/deviations: Shuffle;Decreased stride length;Step-through pattern Gait velocity: decreased   General Gait Details: Slow, mostly steady gait wtih RW for support; Sp02 dropped to 87% on 4L/min 02. 3/4 DOE. Cues for pursed lip breathing. Sp02 able to recover quickly with rest.   Stairs             Wheelchair Mobility    Modified Rankin (Stroke Patients Only)       Balance Overall balance assessment: Mild deficits observed, not formally tested                                          Cognition Arousal/Alertness: Awake/alert Behavior During Therapy: WFL for tasks assessed/performed Overall Cognitive Status: Within Functional Limits for tasks assessed                                 General Comments: Pt scored 14 on BIMS (no cognitive impairment       Exercises      General Comments General comments (skin integrity, edema, etc.): Reports feeling tired after working with OT      Pertinent Vitals/Pain Pain Assessment: Faces Faces Pain Scale: Hurts a little bit Pain Location: Lt UE  Pain Descriptors / Indicators: Grimacing;Guarding Pain Intervention(s): Repositioned;Monitored during session    Home Living Family/patient expects to be discharged  to:: Private residence Living Arrangements: Spouse/significant other Available Help at Discharge: Family;Available 24 hours/day Type of Home: House Home Access: Stairs to enter Entrance Stairs-Rails: Right Home Layout: One level Home Equipment: Cane - single point;Walker - 2 wheels;Shower seat Additional Comments: [name] wife recently discharged from hospital, is deconditioned, and is receiving HH     Prior Function Level of Independence: Independent with assistive device(s)      Comments: Uses SPC for community  ambulation. Drives.   PT Goals (current goals can now be found in the care plan section) Acute Rehab PT Goals Patient Stated Goal: to get stronger  Progress towards PT goals: Progressing toward goals    Frequency           PT Plan Current plan remains appropriate    Co-evaluation              AM-PAC PT "6 Clicks" Mobility   Outcome Measure  Help needed turning from your back to your side while in a flat bed without using bedrails?: A Little Help needed moving from lying on your back to sitting on the side of a flat bed without using bedrails?: A Little Help needed moving to and from a bed to a chair (including a wheelchair)?: A Little Help needed standing up from a chair using your arms (e.g., wheelchair or bedside chair)?: A Little Help needed to walk in hospital room?: A Little Help needed climbing 3-5 steps with a railing? : A Little 6 Click Score: 18    End of Session Equipment Utilized During Treatment: Oxygen;Gait belt Activity Tolerance: Patient limited by fatigue Patient left: in bed;with bed alarm set;with call bell/phone within reach Nurse Communication: Mobility status;Other (comment)(02) PT Visit Diagnosis: Pain;Unsteadiness on feet (R26.81);Muscle weakness (generalized) (M62.81) Pain - Right/Left: Left Pain - part of body: Arm     Time: 1410-1431 PT Time Calculation (min) (ACUTE ONLY): 21 min  Charges:  $Gait Training: 8-22 mins                     Wray Kearns, PT, DPT Acute Rehabilitation Services Pager [phone] Office [phone]       [place] 11/25/2018, 3:00 PM

## 2018-11-25 NOTE — Evaluation (Signed)
Occupational Therapy Evaluation Patient Details Name: [name] MRN: [id number] DOB: 10/09/1931 Today's Date: 11/25/2018    History of Present Illness 85 y.o. male admitted on 11/15/2018 with cellulitis of LUE s/p I&D 6/14 and hydro 6/18 . Also admitted with Decompensated heart failure likely secondary to excessive volume from IV antibiotics. PMH includes DM, Afib on pradaxa, aortic stenosis s/p TAVR, PVD, CAD s/p CABG, HTN, COPD, HLD, HFpEF.   Clinical Impression   Pt admitted with above. He demonstrates the below listed deficits and will benefit from continued OT to maximize safety and independence with BADLs.  Pt presents to OT with generalized weakness, decreased activity tolerance, Pain Lt UE, and impaired balance.  He currently requires min guard - min A for ADLs and functional mobility.   DOE 3/4 with minimal activity.   He lives with wife, who was recently in the hospital, and who is currently receiving HH.  He has very little reserves and will benefit from maximal [facility] services.        Follow Up Recommendations  Home health OT;Supervision/Assistance - 24 hour    Equipment Recommendations  None recommended by OT    Recommendations for Other Services       Precautions / Restrictions Precautions Precautions: Fall Precaution Comments: Lt UE wrapped  Restrictions Weight Bearing Restrictions: No      Mobility Bed Mobility Overal bed mobility: Needs Assistance Bed Mobility: Supine to Sit     Supine to sit: Min guard;HOB elevated     General bed mobility comments: moves slowly.  uses handrails   Transfers Overall transfer level: Needs assistance Equipment used: Rolling walker (2 wheeled);None Transfers: Sit to/from [employer] to Stand: Min guard Stand pivot transfers: Min guard       General transfer comment: close min guard assist for balance     Balance Overall balance assessment: Mild deficits observed, not formally tested                                          ADL either performed or assessed with clinical judgement   ADL Overall ADL's : Needs assistance/impaired Eating/Feeding: Set up;Sitting   Grooming: Wash/dry hands;Wash/dry face;Oral care;Brushing hair;Min guard;Standing   Upper Body Bathing: Set up;Sitting   Lower Body Bathing: Minimal assistance;Sit to/from stand Lower Body Bathing Details (indicate cue type and reason): Pt fatigues  Upper Body Dressing : Set up;Sitting   Lower Body Dressing: Minimal assistance;Sit to/from stand Lower Body Dressing Details (indicate cue type and reason): Pt fatigues  Toilet Transfer: Min guard;Ambulation;Comfort height toilet;BSC;Grab bars;RW   Toileting- Water quality scientist and Hygiene: Min guard;Sit to/from stand       Functional mobility during ADLs: Min guard General ADL Comments: Pt requires encouragement for full participation.  He is able to don/doff socks, but DOE 3/4      Vision Baseline Vision/History: Wears glasses Wears Glasses: At all times       Perception     Praxis      Pertinent Vitals/Pain Pain Assessment: Faces Faces Pain Scale: Hurts little more Pain Location: Lt UE  Pain Descriptors / Indicators: Grimacing;Guarding Pain Intervention(s): Monitored during session     Hand Dominance Right   Extremity/Trunk Assessment Upper Extremity Assessment Upper Extremity Assessment: Generalized weakness;LUE deficits/detail LUE Deficits / Details: Lt UE wrapped, and pt with somewhat limited movement due to pain, but incorporates Lt UE during ADLs  Lower Extremity Assessment Lower Extremity Assessment: Defer to PT evaluation   Cervical / Trunk Assessment Cervical / Trunk Assessment: Normal   Communication Communication Communication: HOH   Cognition Arousal/Alertness: Awake/alert Behavior During Therapy: WFL for tasks assessed/performed Overall Cognitive Status: Within Functional Limits for tasks assessed                                  General Comments: Pt scored 14 on BIMS (no cognitive impairment    General Comments  02 sats >90% on 4L supplemental 02     Exercises     Shoulder Instructions      Home Living Family/patient expects to be discharged to:: Private residence Living Arrangements: Spouse/significant other Available Help at Discharge: Family;Available 24 hours/day Type of Home: House Home Access: Stairs to enter [employer] of Steps: 6 Entrance Stairs-Rails: Right Home Layout: One level     Bathroom Shower/Tub: Tub/shower unit;Walk-in shower   Bathroom Toilet: Standard     Home Equipment: Cane - single point;Walker - 2 wheels;Shower seat   Additional Comments: [name] wife recently discharged from hospital, is deconditioned, and is receiving HH       Prior Functioning/Environment Level of Independence: Independent with assistive device(s)        Comments: Uses SPC for community ambulation. Drives.        OT Problem List: Decreased strength;Pain;Decreased range of motion;Decreased coordination;Impaired sensation;Impaired UE functional use      OT Treatment/Interventions: Self-care/ADL training;Therapeutic exercise;DME and/or AE instruction;Therapeutic activities;Patient/family education;Balance training    OT Goals(Current goals can be found in the care plan section) Acute Rehab OT Goals Patient Stated Goal: to get stronger  OT Goal Formulation: With patient Time For Goal Achievement: 12/09/18 Potential to Achieve Goals: Good ADL Goals Pt Will Perform Grooming: with modified independence;standing Pt Will Perform Upper Body Bathing: with modified independence;sitting Pt Will Perform Lower Body Bathing: with modified independence;sit to/from stand Pt Will Perform Upper Body Dressing: with modified independence;sitting Pt Will Perform Lower Body Dressing: with modified independence;sit to/from stand Pt Will Transfer to Toilet: with modified  independence;ambulating;regular height toilet;bedside commode;grab bars Pt Will Perform Toileting - Clothing Manipulation and hygiene: with modified independence;sit to/from stand Additional ADL Goal #1: Pt will independently incorporate energy conservation techniques into daily activity  OT Frequency: Min 2X/week   Barriers to D/C:            Co-evaluation              AM-PAC OT "6 Clicks" Daily Activity     Outcome Measure Help from another person eating meals?: None Help from another person taking care of personal grooming?: A Little Help from another person toileting, which includes using toliet, bedpan, or urinal?: A Little Help from another person bathing (including washing, rinsing, drying)?: A Little Help from another person to put on and taking off regular upper body clothing?: A Little Help from another person to put on and taking off regular lower body clothing?: A Little 6 Click Score: 19   End of Session Equipment Utilized During Treatment: Gait belt;Rolling walker;Oxygen Nurse Communication: Mobility status  Activity Tolerance: Patient limited by fatigue Patient left: in bed;with call bell/phone within reach  OT Visit Diagnosis: Muscle weakness (generalized) (M62.81);Pain Pain - Right/Left: Left Pain - part of body: Arm;Hand                Time: 4818-5631 OT Time Calculation (min): 28 min Charges:  OT General Charges $OT Visit: 1 Visit OT Evaluation $OT Eval Moderate Complexity: 1 Mod OT Treatments $Self Care/Home Management : 8-22 mins  Lucille Passy, OTR/L Acute Rehabilitation Services Pager [phone] Office [phone]   Lucille Passy M 11/25/2018, 2:16 PM

## 2018-11-25 NOTE — TOC Benefit Eligibility Note (Signed)
Transition of Care [facility]) Benefit Eligibility Note    Patient Details  Name: [name] MRN: [id number] Date of Birth: May 02, 1932   Medication/Dose: ELIQUIS    2.5 MG   CO-PAY- $ 33.00   AND ELIQUIS 5 MG BID  CO-PAY- $33.00  Covered?: Yes  Tier: (NO)  Prescription Coverage Preferred Pharmacy: [facility]  AND [facility] M[facility],  90 DAY SUPPLY FOR M/O $29.00  Spoke with Person/Company/Phone Number:: OLA   @  [employer] RX # [phone]  Co-Pay: $ 33.00  Prior Approval: No  Deductible: Met  Additional Notes: ELIQUIS  10 MG  BID : Crecencio Mc Phone Number: 11/25/2018, 1:41 PM

## 2018-11-25 NOTE — Progress Notes (Signed)
Patient tried using the BSC this morning but was unable to void.  Patient states he has the urge but cannot start urinating.  Bladder scan will need to be done this morning and possible in/out cath if over 300 ml.  I will keep monitoring patient.

## 2018-11-25 NOTE — TOC Transition Note (Signed)
Transition of Care [facility]) - CM/SW Discharge Note   Patient Details  Name: [name] MRN: [id number] Date of Birth: 07-02-1931  Transition of Care [facility]) CM/SW Contact:  Bethena Roys, RN Phone Number: 11/25/2018, 4:26 PM   Clinical Narrative: CM did reach out [facility] to get fax number to send Rx's to [facility] for Dr. Duke Salvia to review. CM reached out to family medicine twice to get Rx's. Will try again in am to fax. CM was able to discuss the homemaker aide program with the [place] CSW-this program allows for an aide to be in the home certain hours per day per the assessment. Patient does meet the criteria for the program- he will be screen further to see how many hours he will be able to receive. These services will not interfere with [place] will pay for these. CM will continue to monitor for additional transition of care needs.         Barriers to Discharge: Continued Medical Work up   Patient Goals and CMS Choice Patient states their goals for this hospitalization and ongoing recovery are:: to get back home CMS Medicare.gov Compare Post Acute Care list provided to:: Patient Choice offered to / list presented to : Patient   Discharge Plan and Services   Discharge Planning Services: CM Consult Post Acute Care Choice: Home Health         HH Arranged: RN, PT, OT, Nurse's Aide, Respirator Therapy HH Agency: [facility] Agency Contacted: 11/22/18 Time [place]: 1359 Representative spoke with at [place]: [place] Interventions     Readmission Risk Interventions No flowsheet data found.

## 2018-11-25 NOTE — Progress Notes (Signed)
Family Medicine Teaching Service Daily Progress Note Intern Pager: [phone]  Patient name: [name] Medical record number: [id number] Date of birth: 1931-08-09 Age: 85 y.o. Gender: male  Primary Care Provider: Nicoletta Dress, MD Consultants: none Code Status: Full  Pt Overview and Major Events to Date:  6/13 - admitted with L arm cellulitis 6/14 - I+D of Left forearm abscess 6/17 - hydrotherapy  Assessment and Plan: [name] is a 85 y.o. male presenting with L arm redness. PMH is significant for DM, Afib on pradaxa, aortic stenosis s/p TAVR, PVD, CAD s/p CABG, HTN, COPD, HLD, HFpEF, GERD.  Pulmonary embolism CTA chest performed on 6/21+ for pulmonary embolism.  Heparin started 11/2110 hours following last dose of Pradaxa.  Will transition to NOAC or once his breathing status is stabilized.  He continues to demonstrate mild respiratory distress on exam.  He reports that he would be comfortable transitioning to the medication other than Pradaxa if that is helpful. -Heparin per pharmacy  Decompensated heart failure likely secondary to excessive volume from IV antibiotics 0.9 L urine output on 6/22, net +1.8 L for hospitalization.  2.1 kg increased from admission. -Home regimen Lasix p.o. 40 mg - Lasix IV 40 mg twice daily - Strict I's and O's - Daily weights  COPD.  Improved air movement today.  Minimal wheezing noted on exam. -Prednisone 40 mg (6/22-6/26) -Incruse daily -Scheduled duo nebs every 8 hours -Albuterol nebs every 4 hours as needed  L arm cellulitis. S/p incision and drainage in the OR on 8/08 without complication.  Hydrotherapy performed on 6/22.  He will follow-up with orthopedics outpatient for further wound care. -DC Ancef (6/13-6/14) -DC vanc (6/14-6/16 due to concern for potential kidney injury -Unasyn (6/14 -18) - Augmentin ( started 6/18-26) for total of 14 days of treatment -Tylenol 650 every 6 hours scheduled -Oxycodone 5 mg every 6 hours  scheduled -morphine 2 mg every 4 hours as needed -Dilaudid 1 mg given for hydrotherapy -OT - outpatient follow up  A Fib.   Stable, poor rate control over the last 24 hours. With rates consistently above 100 - 130s. - IV Metop for rates over 140 -Pradaxa restarted on 6/18 -metoprolol 100mg  BID  Diabetes.  Last A1c viewable is 6.1 on 09/13/2017.  -holding metformin 500 mg daily -Monitor CBGs in the setting of acute infection  PVD, CAD s/p CABG, HLD -Continue home atorvastatin   GERD -Pantoprazole 40 mg daily  HFpEF, HTN  Stable - Lasix as above - terazosin 5 mg daily - metoprolol as per above  Anemia, iron deficiency -Iron supplements  Allergies -Loratadine 10 mg daily  FEN/GI: Heart healthy carb modified Prophylaxis: Heparin   Disposition: 1-2 days of hospitalization anticipated prior to discharge home  Subjective:  No acute events overnight.  He reports significantly improved respiratory status compared to 6/22.  He does note that he is certainly not back to baseline yet.  Reports that his left arm pain has been improving and the erythema decreasing.  Objective: Temp:  [97.5 F (36.4 C)-98 F (36.7 C)] 97.8 F (36.6 C) (06/23 0007) Pulse Rate:  [74-104] 74 (06/23 0007) Resp:  [16-22] 22 (06/22 1422) BP: (154-156)/(48-61) 155/48 (06/23 0007) SpO2:  [98 %-100 %] 100 % (06/23 0007) Physical Exam: General: Alert and cooperative.  Breathing with mild respiratory distress on 4 L nasal cannula. HEENT: JVD to angle of jaw. Cardio: Normal S1 and S2, no S3 or S4. Rhythm is regular. No murmurs or rubs.  Pulm: Improved air movement compared to previous exam.  Still decreased in lower fields.  Minimal wheezing appreciated on exam today. Abdomen: Bowel sounds normal. Abdomen soft and non-tender.  Extremities: No peripheral edema. Warm/ well perfused.  Strong radial pulse. Neuro: Cranial nerves grossly intact    Laboratory: Recent Labs  Lab 11/22/18 0330  11/23/18 0413 11/24/18 0358  WBC 5.6 4.9 4.9  HGB 10.1* 10.5* 10.5*  HCT 31.5* 32.4* 32.0*  PLT 175 221 218   Recent Labs  Lab 11/22/18 0330 11/23/18 0413 11/24/18 0358  NA 135 138 138  K 3.5 3.7 3.7  CL 100 100 100  CO2 28 28 30   BUN 13 12 12   CREATININE 0.84 0.82 0.80  CALCIUM 8.2* 8.6* 8.6*  GLUCOSE 140* 133* 113*    Imaging/Diagnostic Tests: No results found.  Matilde Haymaker, MD 11/25/2018, 6:29 AM PGY-2, [place] Intern pager: [phone], text pages welcome

## 2018-11-26 LAB — CBC
HCT: 33.3 % — ABNORMAL LOW (ref 39.0–52.0)
Hemoglobin: 10.7 g/dL — ABNORMAL LOW (ref 13.0–17.0)
MCH: 28.6 pg (ref 26.0–34.0)
MCHC: 32.1 g/dL (ref 30.0–36.0)
MCV: 89 fL (ref 80.0–100.0)
Platelets: 229 10*3/uL (ref 150–400)
RBC: 3.74 MIL/uL — ABNORMAL LOW (ref 4.22–5.81)
RDW: 14.5 % (ref 11.5–15.5)
WBC: 6.1 10*3/uL (ref 4.0–10.5)
nRBC: 0 % (ref 0.0–0.2)

## 2018-11-26 LAB — GLUCOSE, CAPILLARY
Glucose-Capillary: 111 mg/dL — ABNORMAL HIGH (ref 70–99)
Glucose-Capillary: 144 mg/dL — ABNORMAL HIGH (ref 70–99)

## 2018-11-26 LAB — HEPARIN LEVEL (UNFRACTIONATED): Heparin Unfractionated: >2.2 IU/mL — ABNORMAL HIGH (ref 0.30–0.70)

## 2018-11-26 MED ORDER — AMOXICILLIN-POT CLAVULANATE 875-125 MG PO TABS: 1.0000 | ORAL_TABLET | Freq: Two times a day (BID) | ORAL | 0 refills | Status: DC

## 2018-11-26 MED ORDER — OXYCODONE HCL 5 MG PO TABS: ORAL_TABLET | ORAL | 0 refills | Status: DC

## 2018-11-26 MED ORDER — APIXABAN 5 MG PO TABS: 10.0000 mg | ORAL_TABLET | Freq: Two times a day (BID) | ORAL | 0 refills | Status: DC

## 2018-11-26 MED ORDER — FUROSEMIDE 40 MG PO TABS: 60.0000 mg | ORAL_TABLET | Freq: Every day | ORAL | Status: DC

## 2018-11-26 MED ORDER — UMECLIDINIUM BROMIDE 62.5 MCG/INH IN AEPB: 1.0000 | INHALATION_SPRAY | Freq: Every day | RESPIRATORY_TRACT | 0 refills | Status: AC

## 2018-11-26 MED ORDER — FUROSEMIDE 20 MG PO TABS: 60.0000 mg | ORAL_TABLET | Freq: Every day | ORAL | 5 refills | Status: DC

## 2018-11-26 MED ORDER — PREDNISONE 20 MG PO TABS: 40.0000 mg | ORAL_TABLET | Freq: Every day | ORAL | 0 refills | Status: DC

## 2018-11-26 MED ORDER — OXYCODONE HCL 5 MG PO TABS: 5.0000 mg | ORAL_TABLET | ORAL | 0 refills | Status: DC | PRN

## 2018-11-26 MED ORDER — METOPROLOL TARTRATE 50 MG PO TABS: 100.0000 mg | ORAL_TABLET | Freq: Two times a day (BID) | ORAL | 4 refills | Status: DC

## 2018-11-26 MED FILL — AMOX-CLAV 875-125 MG TABLET: 875-125 | 3 days supply | Qty: 5 | Fill #0

## 2018-11-26 MED FILL — ELIQUIS STARTER PACK 5 MG T: 5 | 30 days supply | Qty: 74 | Fill #0

## 2018-11-26 MED FILL — predniSONE 20 MG TABS: 20 | 2 days supply | Qty: 4 | Fill #0

## 2018-11-26 MED FILL — METOPROLOL TARTRATE 50 MG T: 50 | 30 days supply | Qty: 120 | Fill #0

## 2018-11-26 MED FILL — FUROSEMIDE 20 MG TAB: 20 | 30 days supply | Qty: 90 | Fill #0

## 2018-11-26 MED FILL — oxyCODONE HCL 5 MG TABS: 5 | 4 days supply | Qty: 30 | Fill #0

## 2018-11-26 NOTE — Progress Notes (Signed)
Physical Therapy Wound Treatment Patient Details  Name: [name] MRN: [id number] Date of Birth: [birth year]  Today's Date: 11/26/2018 Time: 1200-1244 Time Calculation (min): 44 min  Subjective  Subjective: I hope I go today Patient and Family Stated Goals: Healed up so I can use it. Prior Treatments: unknown  Pain Score: Pain Score: 4 premedicated  Wound Assessment  Wound / Incision (Open or Dehisced) 11/19/18 Other (Comment) Arm Left left dorsal cellulitic wound post I and D (Active)  Dressing Type Compression wrap;Gauze (Comment);Impregnated gauze (bismuth);Moist to dry 11/26/18 1358  Dressing Changed New 11/19/18 1654  Dressing Status Clean;Dry;Intact 11/26/18 1358  Dressing Change Frequency Daily 11/26/18 1358  Site / Wound Assessment Purple;Pink 11/26/18 1358  % Wound base Red or Granulating 95% 11/26/18 1358  % Wound base Yellow/Fibrinous Exudate 5% 11/26/18 1358  % Wound base Black/Eschar 0% 11/26/18 1358  Peri-wound Assessment Erythema (blanchable);Pink 11/26/18 1358  Wound Length (cm) 3 cm 11/19/18 1654  Wound Width (cm) 3 cm 11/19/18 1654  Wound Depth (cm) 0.3 cm 11/19/18 1654  Wound Volume (cm^3) 2.7 cm^3 11/19/18 1654  Wound Surface Area (cm^2) 9 cm^2 11/19/18 1654  Undermining (cm) 12*  2 cm 11/19/18 1654  Margins Unattached edges (unapproximated) 11/26/18 1358  Drainage Amount Minimal 11/26/18 1358  Drainage Description Serous 11/26/18 1358  Treatment Cleansed;Debridement (Selective);Hydrotherapy (Pulse lavage) 11/26/18 1358     Wound / Incision (Open or Dehisced) 11/19/18 Other (Comment) Arm Left ventral forearm wound post I and D (Active)  Dressing Type Compression wrap;Gauze (Comment);Impregnated gauze (bismuth);Moist to dry 11/26/18 1358  Dressing Changed Changed 11/26/18 1358  Dressing Status Clean;Intact 11/26/18 1358  Dressing Change Frequency Daily 11/26/18 1358  Site / Wound Assessment Clean;Dry;Painful;Red 11/26/18 1358  % Wound base Red or  Granulating 85% 11/26/18 1358  % Wound base Yellow/Fibrinous Exudate 5% 11/26/18 1358  % Wound base Other/Granulation Tissue (Comment) 10% 11/26/18 1358  Peri-wound Assessment Other (Comment);Edema;Erythema (blanchable);Purple 11/26/18 1358  Wound Length (cm) 21 cm 11/19/18 1654  Wound Width (cm) 4 cm 11/19/18 1654  Wound Depth (cm) 1 cm 11/19/18 1654  Wound Volume (cm^3) 84 cm^3 11/19/18 1654  Wound Surface Area (cm^2) 84 cm^2 11/19/18 1654  Tunneling (cm) 4  @ 6 * 11/19/18 1654  Drainage Amount Minimal 11/26/18 1358  Drainage Description Serous 11/26/18 1358  Treatment Cleansed;Debridement (Selective);Hydrotherapy (Pulse lavage) 11/26/18 1358      Hydrotherapy Pulsed lavage therapy - wound location: l forearm dorsum and ventral surfaces due to cellulitis from striking the back of his arm.   Pulsed Lavage with Suction (psi): 4 psi Pulsed Lavage with Suction - Normal Saline Used: 1000 mL Pulsed Lavage Tip: Tip with splash shield Selective Debridement Selective Debridement - Location: L forearm Selective Debridement - Tools Used: Forceps;Scissors;Scalpel Selective Debridement - Tissue Removed: Necrotic trash and other tissues.   Wound Assessment and Plan  Wound Therapy - Assess/Plan/Recommendations Wound Therapy - Clinical Statement: This wound can benefit from PLS to decrease bacterial load and soften up some tissues for selective debriding.  Plus we will use a hydrogel or adaptic to keep the tendon moist.  Pt was educated on the dressing basics so that he could coach his wife in doing a necessary dressing change. Wound Therapy - Functional Problem List: stiff sore L finger/hand movement Factors Delaying/Impairing Wound Healing: Infection - systemic/local Hydrotherapy Plan: Debridement;Dressing change;Patient/family education;Pulsatile lavage with suction Wound Therapy - Frequency: 6X / week Wound Therapy - Current Recommendations: Case manager/social work Wound Therapy - Follow Up  Recommendations: Home  health RN Wound Plan: see above  Wound Therapy Goals- Improve the function of patient's integumentary system by progressing the wound(s) through the phases of wound healing (inflammation - proliferation - remodeling) by: Decrease Necrotic Tissue to: 5% Decrease Necrotic Tissue - Progress: Met Increase Granulation Tissue to: 95 including any healthy tissues Increase Granulation Tissue - Progress: Partly met Goals/treatment plan/discharge plan were made with and agreed upon by patient/family: Yes Time For Goal Achievement: 7 days Wound Therapy - Potential for Goals: Good  Goals will be updated until maximal potential achieved or discharge criteria met.  Discharge criteria: when goals achieved, discharge from hospital, MD decision/surgical intervention, no progress towards goals, refusal/missing three consecutive treatments without notification or medical reason.  GP     Tessie Fass Dinara Lupu 11/26/2018, 2:07 PM  11/26/2018  Donnella Sham, PT Acute Rehabilitation Services [phone]  (pager) [phone]  (office)

## 2018-11-26 NOTE — Discharge Summary (Signed)
[facility] Discharge Summary  Patient name: [name] Medical record number: [id number] Date of birth: 07/07/1931 Age: 85 y.o. Gender: male Date of Admission: 11/15/2018  Date of Discharge: 11/26/2018 Admitting Physician: Martyn Malay, MD  Primary Care Provider: Nicoletta Dress, MD Consultants: Ortho  Indication for Hospitalization: left arm cellulitis  Discharge Diagnoses/Problem List:  Pulmonary embolism Decompensated heart failure COPD Left arm cellulitis A. fib Diabetes PVD, CAD GERD Anemia   Disposition: Discharge home  Discharge Condition: Stable  Discharge Exam:  General: Alert and cooperative and appears to be in no acute distress Cardio: Normal S1 and S2, no S3 or S4. Rhythm is irregular. No murmurs or rubs.   Pulm: Breathing comfortably on 3 L nasal cannula.  Satting well.    Mildly decreased air movement in right lung fields.  No wheezing/stridor noted. Abdomen: Bowel sounds normal. Abdomen soft and non-tender.  Extremities: 3+ peripheral edema (baseline per patient). Warm/ well perfused.  Strong radial pulse. Neuro: Cranial nerves grossly intact  Brief Hospital Course:  [name] a 85 y.o.malepresenting with Larm redness. PMH is significant forDM, Afib on pradaxa,aortic stenosis s/pTAVR, PVD,CAD s/p CABG, HTN, COPD, HLD, HFpEF, GERD.  Left arm cellulitis On presentation to the ED, he had left arm swelling and pain consistent with cellulitis.  He was started on Ancef.  On 6/14 he underwent surgical incision and drainage in the OR.  The operative note noted that he had significant serosanguineous drainage throughout the arm without evidence of purulence.  Following incision and drainage, he was placed on vancomycin and Unasyn.  Over the next several days, he showed slow signs of improvement and on 6/18, orthopedics noted that there is satisfied with his improvement and they did not think he would require further  debridement in the OR.  At that time, he was transitioned to a p.o. Augmentin for a total of a 14-day treatment.  In addition to antibiotics, he received regular hydrotherapy.  At the time of discharge, he noted significant improvement in his pain in addition to his finger mobility.  His physical exam showed improved erythema compared to admission.  He will follow-up with orthopedics, home health RN for wound care was ordered.  Antibiotics: Ancef (6/13-6/14) Vancomycin (6/14-6/16) Unasyn (6/14-6/18) Augmentin (6/18-6/26)  Pulmonary embolism On admission, his Pradaxa was held so that he could undergo surgical incision and drainage.  Following surgery, his Pradaxa continued to be held due to concern he may have to return to the OR for further debridement.  His Pradaxa was held from 6/13-6/18).  On 6/18, he was noted to have increased respiratory distress which was thought to be related to fluid overload from IV antibiotics.  He did not show significant improvement with diuresis and a CTA chest was performed on 6/21.  CTA showed pulmonary embolus in the right lower lobe.  He was started on IV heparin on 6/21.  On 6/23, he was transitioned to p.o. Eliquis with the plan to load with 10 mg twice daily for 7 days followed by 5 mg twice daily thereafter.  He was sent home on 10 mg twice daily of Eliquis with his last day at the loading dose being 6/27.  Heart failure, acute on chronic He began demonstrating increased work of breathing on 6/18.  At first this was thought to only be due to volume overload secondary to IV antibiotics.  He underwent diuresis with IV Lasix 40 mg daily.  He showed only mild diuresis until his  Lasix regimen was increased to IV Lasix 40 mg twice daily.  On 6/24 he was found to be breathing comfortably on his home oxygen requirement with a fluid status consistent with his baseline exam.  He was sent home on an increased Lasix regimen of 60 mg daily.  COPD exacerbation On 6/22, he is  found to exhibit significant wheezing and poor air movement on physical exam.  This is likely secondary to agitation from fluid overload and his pulmonary embolism.  He was started on a 5-day course of prednisone 40 mg in addition to scheduled duo nebs.  On 6/23 he showed significant signs of improvement.  On 6/24 he had no wheezing notable on exam is breathing comfortably on his home oxygen requirement (3 L [place]).  He was discharged home with his p.o. steroids (last day 6/26).  Issues for Follow Up:  1. Follow-up with orthopedics for appropriate wound care. 2. Ensure completion of antibiotics (last day 11/28/2018) 3. Assess respiratory status.  Discharged on a 5-day course of prednisone 40 mg for COPD exacerbation. 4. BMP to assess creatinine and potassium.  Discharged on an increase dose of p.o. Lasix. (DC on 60 mg daily - increase from 40 mg daily on admission). 5.   He was transitioned from Pradaxa to Eliquis for appropriate anticoagulation for his pulmonary embolism and atrial fibrillation.  He was told to discontinue his Pradaxa.  Significant Procedures:  6/13 -incision and drainage of left arm cellulitis  Significant Labs and Imaging:  Recent Labs  Lab 11/24/18 0358 11/25/18 0605 11/26/18 0606  WBC 4.9 5.1 6.1  HGB 10.5* 10.2* 10.7*  HCT 32.0* 31.5* 33.3*  PLT 218 225 229   Recent Labs  Lab 11/21/18 0402 11/22/18 0330 11/23/18 0413 11/24/18 0358  NA 139 135 138 138  K 3.7 3.5 3.7 3.7  CL 103 100 100 100  CO2 28 28 28 30   GLUCOSE 129* 140* 133* 113*  BUN 12 13 12 12   CREATININE 0.82 0.84 0.82 0.80  CALCIUM 8.4* 8.2* 8.6* 8.6*    Dg Chest 2 View  Result Date: 11/22/2018 CLINICAL DATA:  Hypoxemia EXAM: CHEST - 2 VIEW COMPARISON:  11/20/2018, 08/25/2018 FINDINGS: Post sternotomy changes and valve prosthesis. Small bilateral pleural effusions without significant change. Cardiomegaly with vascular congestion and diffuse interstitial and ground-glass opacity suspect for edema.  Bibasilar consolidations. Aortic atherosclerosis. No pneumothorax. IMPRESSION: No significant interval change in cardiomegaly, vascular congestion, pulmonary edema and small bilateral effusions. Bibasilar atelectasis or pneumonia, also no significant change. Electronically Signed   By: Donavan Foil M.D.   On: 11/22/2018 16:36   Dg Chest 2 View  Result Date: 11/20/2018 CLINICAL DATA:  Shortness of breath and cough EXAM: CHEST - 2 VIEW COMPARISON:  08/25/2018 prior radiographs FINDINGS: Cardiomegaly and CABG/cardiac valve replacement changes again noted. Pulmonary vascular congestion with interstitial edema noted. Small bilateral pleural effusions and bibasilar atelectasis identified. No pneumothorax. IMPRESSION: Cardiomegaly with interstitial pulmonary edema and small bilateral pleural effusions. Electronically Signed   By: Margarette Canada M.D.   On: 11/20/2018 11:06   Dg Forearm Left  Result Date: 11/15/2018 CLINICAL DATA:  Increased swelling in LEFT forearm, redness and fever after he hit his arm with a door, cellulitis EXAM: LEFT FOREARM - 2 VIEW COMPARISON:  None FINDINGS: Osseous demineralization. Wrist and elbow joint alignments normal. No fracture, dislocation, or bone destruction. Soft tissue swelling at the proximal forearm and elbow region without soft tissue gas. Scattered atherosclerotic calcifications. IMPRESSION: No acute osseous abnormalities. Electronically Signed  By: Lavonia Dana M.D.   On: 11/15/2018 21:08   Ct Angio Chest Pe W Or Wo Contrast  Result Date: 11/23/2018 CLINICAL DATA:  Ongoing shortness of breath. Recent left arm surgery. EXAM: CT ANGIOGRAPHY CHEST WITH CONTRAST TECHNIQUE: Multidetector CT imaging of the chest was performed using the standard protocol during bolus administration of intravenous contrast. Multiplanar CT image reconstructions and MIPs were obtained to evaluate the vascular anatomy. CONTRAST:  30mL OMNIPAQUE IOHEXOL 350 MG/ML SOLN COMPARISON:  08/22/2017  FINDINGS: Cardiovascular: Moderate stable cardiomegaly. Calcification of the mitral valve annulus. Interval TAVR. Calcification over the left main and 3 vessel coronary arteries. Calcified plaque over the thoracic aorta. Pulmonary arterial system is well opacified. There is subtle peripheral linear filling defect over a distal right lower lobar pulmonary artery which may be sequelae from chronic thrombus although small acute thrombus is possible. Mediastinum/Nodes: No significant mediastinal or hilar adenopathy. Few shoddy mediastinal lymph nodes are present. Remaining mediastinal structures are unremarkable. Lungs/Pleura: Lungs are adequately inflated demonstrate moderate size bilateral pleural effusions with associated compressive atelectasis in the lung bases. Linear atelectasis over the lingula. Airways are unremarkable. Upper Abdomen: Calcified plaque over the abdominal aorta. Tiny amount of perihepatic fluid is present. Musculoskeletal: Degenerative change of the spine. Stable mild T12 compression fracture. Review of the MIP images confirms the above findings. IMPRESSION: Tiny peripheral linear filling defect over a distal right lower lobar pulmonary artery which may be due to acute thrombus versus sequelae from chronic thrombus. Moderate size bilateral pleural effusions with associated bibasilar compressive atelectasis. Linear atelectasis over the lingula. Cardiomegaly. Atherosclerotic coronary artery disease. Evidence of TAVR. Aortic Atherosclerosis (ICD10-I70.0). Stable T12 compression fracture. These results will be called to the ordering clinician or representative by the Radiologist Assistant, and communication documented in the PACS or zVision Dashboard. Electronically Signed   By: Marin Olp M.D.   On: 11/23/2018 13:01   Dg Hand Complete Left  Result Date: 11/15/2018 CLINICAL DATA:  Increased swelling in LEFT forearm, redness and fever after he hit his arm with a door, cellulitis EXAM: LEFT HAND  - COMPLETE 3+ VIEW COMPARISON:  None FINDINGS: Osseous demineralization. Joint spaces preserved. No fracture, dislocation, or bone destruction. Scattered soft tissue swelling and atherosclerotic calcifications. IMPRESSION: No acute osseous abnormalities. Electronically Signed   By: Lavonia Dana M.D.   On: 11/15/2018 21:09     Results/Tests Pending at Time of Discharge: none  Discharge Medications:  Allergies as of 11/26/2018      Reactions   Bactrim [sulfamethoxazole-trimethoprim] Other (See Comments)   "does not do anything for me"   Levaquin [levofloxacin In D5w] Diarrhea      Medication List    STOP taking these medications   amoxicillin 500 MG tablet Commonly known as: AMOXIL   Pradaxa 150 MG Caps capsule Generic drug: dabigatran     TAKE these medications   acetaminophen 500 MG tablet Commonly known as: TYLENOL Take 1,000 mg by mouth every 6 (six) hours as needed for moderate pain or headache.   acyclovir 800 MG tablet Commonly known as: ZOVIRAX Take 400 mg by mouth 2 (two) times daily.   albuterol 90 MCG/ACT inhaler Commonly known as: PROVENTIL,VENTOLIN Inhale 2 puffs into the lungs every 4 (four) hours as needed for shortness of breath.   amoxicillin-clavulanate 875-125 MG tablet Commonly known as: AUGMENTIN Take 1 tablet by mouth every 12 (twelve) hours.   apixaban 5 MG Tabs tablet Commonly known as: ELIQUIS Take 2 tablets (10 mg total) by mouth 2 (  two) times daily. Take two tablets in the morning and two tablets in the evening through 6/27. Starting on 6/28, in the morning, take one tablet in the morning and one tablet in the evening.   atorvastatin 80 MG tablet Commonly known as: LIPITOR Take 1 tablet (80 mg total) by mouth at bedtime.   benazepril 20 MG tablet Commonly known as: LOTENSIN Take 10 mg by mouth daily.   brimonidine 0.2 % ophthalmic solution Commonly known as: ALPHAGAN Place 1 drop into both eyes 2 (two) times daily.   Claritin 10 MG  tablet Generic drug: loratadine Take 10 mg by mouth daily.   ferrous sulfate 325 (65 FE) MG tablet Take 325 mg by mouth daily with breakfast.   furosemide 20 MG tablet Commonly known as: LASIX Take 3 tablets (60 mg total) by mouth daily. What changed: how much to take   ipratropium-albuterol 0.5-2.5 (3) MG/3ML Soln Commonly known as: DUONEB Take 3 mLs by nebulization 3 (three) times daily.   lansoprazole 30 MG capsule Commonly known as: PREVACID Take 30 mg by mouth daily.   metFORMIN 500 MG tablet Commonly known as: GLUCOPHAGE Take 500 mg by mouth daily with breakfast.   metoprolol tartrate 50 MG tablet Commonly known as: LOPRESSOR Take 2 tablets (100 mg total) by mouth 2 (two) times daily. What changed: how much to take   multivitamin with minerals Tabs tablet Take 1 tablet by mouth daily.   multivitamin-lutein Caps capsule Take 1 capsule by mouth daily.   oxyCODONE 5 MG immediate release tablet Commonly known as: Oxy IR/ROXICODONE Take 1-1.5 tablets (5-7.5 mg total) by mouth every 4 (four) hours as needed for moderate pain or severe pain (5 for moderate pain, 7.5 for severe).   predniSONE 20 MG tablet Commonly known as: DELTASONE Take 2 tablets (40 mg total) by mouth daily with breakfast. Start taking on: November 27, 2018 What changed:   how much to take  when to take this  reasons to take this   terazosin 5 MG capsule Commonly known as: HYTRIN Take 5 mg by mouth at bedtime.   trolamine salicylate 10 % cream Commonly known as: ASPERCREME Apply 1 application topically 4 (four) times daily as needed for muscle pain.   umeclidinium bromide 62.5 MCG/INH Aepb Commonly known as: INCRUSE ELLIPTA Inhale 1 puff into the lungs daily. Start taking on: November 27, 2018   Vitamin D 50 MCG (2000 UT) Caps Take 2,000 Units by mouth daily.       Discharge Instructions: Please refer to Patient Instructions section of EMR for full details.  Patient was counseled  important signs and symptoms that should prompt return to medical care, changes in medications, dietary instructions, activity restrictions, and follow up appointments.   Follow-Up Appointments: Follow-up Information    Call Leanora Cover, MD.   Specialty: Orthopedic Surgery Contact information: [place] [address] [phone]           Matilde Haymaker, MD 11/26/2018, 1:59 PM PGY-1, [place]

## 2018-11-26 NOTE — TOC Transition Note (Signed)
Transition of Care [facility]) - CM/SW Discharge Note   Patient Details  Name: [name] MRN: [id number] Date of Birth: 1931/12/10  [facility] CM/SW Contact:  Bethena Roys, RN Phone Number: 11/26/2018, 1:33 PM   Clinical Narrative: CM received verbal permission to fax new Rx's to [facility] so Dr. Duke Salvia can [facility]. [facility] to patient. Several Rx's were sent to the [facility] pharmacy to be filled. No further needs from CM at this time.         Barriers to Discharge: Continued Medical Work up   Patient Goals and CMS Choice Patient states their goals for this hospitalization and ongoing recovery are:: to get back home CMS Medicare.gov Compare Post Acute Care list provided to:: Patient Choice offered to / list presented to : Patient  Discharge Placement                       Discharge Plan and Services   Discharge Planning Services: CM Consult Post Acute Care Choice: Home Health                    HH Arranged: RN, PT, OT, Nurse's Aide, Respirator Therapy HH Agency: [place] Date [facility] Agency Contacted: 11/22/18 Time [place]: 1359 Representative spoke with at [place]: [place] Interventions     Readmission Risk Interventions No flowsheet data found.

## 2018-11-26 NOTE — Progress Notes (Signed)
Occupational Therapy Treatment Patient Details Name: [name] MRN: [id number] DOB: 02/20/1932 Today's Date: 11/26/2018    History of present illness 85 y.o. male admitted on 11/15/2018 with cellulitis of LUE s/p I&D 6/14 and hydro 6/18 . Also admitted with Decompensated heart failure likely secondary to excessive volume from IV antibiotics. Found to have a PE 6/21. PMH includes DM, Afib on pradaxa, aortic stenosis s/p TAVR, PVD, CAD s/p CABG, HTN, COPD, HLD, HFpEF.   OT comments  Pt progressing toward established goals. Pt currently requires minguard-minA for ADL and minguard-minA for functional mobility with use of RW. Pt on 4lnc quickly desats during functional mobility, SpO2 94% at start of mobility, dropped to 80% (not a good pleth read) after ambulating ~ 15 feet. Pt required seated rest break with pursed lip breathing to return SpO2 to 93% after 77min. Recommend Rollator to maximize pt's safety and independence with functional mobility, notified case Warehouse manager. Continued to reinforce energy conservation strategies and general UE HEP with provided handout. Pt will continue to benefit from skilled OT services to maximize safety and independence with ADL/IADL and functional mobility. Will continue to follow acutely and progress as tolerated should pt not d/c this date.      Follow Up Recommendations  Home health OT;Supervision/Assistance - 24 hour    Equipment Recommendations  Rollator (for mobility)   Recommendations for Other Services      Precautions / Restrictions Precautions Precautions: Fall Precaution Comments: Lt UE wrapped ; watch 02 Restrictions Weight Bearing Restrictions: No       Mobility Bed Mobility Overal bed mobility: Needs Assistance Bed Mobility: Supine to Sit;Sit to Supine     Supine to sit: Min guard;HOB elevated Sit to supine: Min guard;HOB elevated   General bed mobility comments: moves slowly.  uses handrails. Some difficulty bringing LEs  into bed but able to do without assist.  Transfers Overall transfer level: Needs assistance Equipment used: Rolling walker (2 wheeled) Transfers: Sit to/from Stand Sit to Stand: Min guard;From elevated surface         General transfer comment: Able to stand from elevated bed height without assist.    Balance Overall balance assessment: Mild deficits observed, not formally tested                                         ADL either performed or assessed with clinical judgement   ADL Overall ADL's : Needs assistance/impaired Eating/Feeding: Set up;Sitting                       Toilet Transfer: Min guard;Ambulation Toilet Transfer Details (indicate cue type and reason): simulated transfer with sit<>stand from EOB with in room mobility and return to [place] and Hygiene: Min guard;Sit to/from stand       Functional mobility during ADLs: Min guard;Rolling walker General ADL Comments: Reviewed energy conservation strategies with pt, pt able to identify 3 stratgies to use during ADL following d/c, pt on 4lnc throughout session desat to 80 (not a good pleth read), reports of dizziness, pursed lip breathing and seated rest break for 19min to return SpO2 to 93%.     Vision       Perception     Praxis      Cognition Arousal/Alertness: Awake/alert Behavior During Therapy: WFL for tasks assessed/performed Overall Cognitive Status: Within Functional Limits for  tasks assessed                                 General Comments:        Upper Extremity Assessment Generalized weakness;LUE deficits/details: LUE wrapped, and reports increased pain with supination, pronation, elbow flexion/extension, digit flexion/extension;encouraged pt to continue HEP UE exercises to maximize return to PLOF, provided pt with written HEP. pt with somewhat limited movement due to pain, but incorporates LUE during ADLs) unable to fully  assess ROM secondary to pain with movement;pt with limited ROM.  Coordination: Decreased fine motor;decreased gross motor   Exercises     Shoulder Instructions       General Comments discussed use of rollator to maximize pt's safety during mobility, communicated recommendation with RN and Case manager;    Pertinent Vitals/ Pain       Pain Assessment: 0-10 Pain Score: 10-Worst pain ever Faces Pain Scale: Hurts little more Pain Location: LUE with movement  Pain Descriptors / Indicators: Grimacing;Guarding Pain Intervention(s): Limited activity within patient's tolerance;Monitored during session  Home Living                                          Prior Functioning/Environment              Frequency  Min 2X/week        Progress Toward Goals  OT Goals(current goals can now be found in the care plan section)  Progress towards OT goals: Progressing toward goals(Current level adequate for d.c)  Acute Rehab OT Goals Patient Stated Goal: to go home to his wife OT Goal Formulation: With patient Time For Goal Achievement: 12/09/18 Potential to Achieve Goals: Good ADL Goals Pt Will Perform Grooming: with modified independence;standing Pt Will Perform Upper Body Bathing: with modified independence;sitting Pt Will Perform Lower Body Bathing: with modified independence;sit to/from stand Pt Will Perform Upper Body Dressing: with modified independence;sitting Pt Will Perform Lower Body Dressing: with modified independence;sit to/from stand Pt Will Transfer to Toilet: with modified independence;ambulating;regular height toilet;bedside commode;grab bars Pt Will Perform Toileting - Clothing Manipulation and hygiene: with modified independence;sit to/from stand Additional ADL Goal #1: Pt will independently incorporate energy conservation techniques into daily activity  Plan Discharge plan remains appropriate    Co-evaluation                 AM-PAC OT "6  Clicks" Daily Activity     Outcome Measure   Help from another person eating meals?: None Help from another person taking care of personal grooming?: A Little Help from another person toileting, which includes using toliet, bedpan, or urinal?: A Little Help from another person bathing (including washing, rinsing, drying)?: A Little Help from another person to put on and taking off regular upper body clothing?: A Little Help from another person to put on and taking off regular lower body clothing?: A Little 6 Click Score: 19    End of Session Equipment Utilized During Treatment: Gait belt;Rolling walker;Oxygen  OT Visit Diagnosis: Muscle weakness (generalized) (M62.81);Pain Pain - Right/Left: Left Pain - part of body: Arm;Hand   Activity Tolerance Patient tolerated treatment well   Patient Left in bed;with call bell/phone within reach;with bed alarm set   Nurse Communication Mobility status        Time: 6314-9702 OT Time Calculation (min): 32  min  Charges: OT General Charges $OT Visit: 1 Visit OT Treatments $Self Care/Home Management : 23-37 mins  Dorinda Hill OTR/L [place] Office: [place] 11/26/2018, 4:14 PM

## 2018-11-26 NOTE — Progress Notes (Addendum)
Family Medicine Teaching Service Daily Progress Note Intern Pager: [phone]  Patient name: [name] Medical record number: [id number] Date of birth: 10/18/1931 Age: 85 y.o. Gender: male  Primary Care Provider: Nicoletta Dress, MD Consultants: none Code Status: Full  Pt Overview and Major Events to Date:  6/13 - admitted with L arm cellulitis 6/14 - I+D of Left forearm abscess 6/17 - hydrotherapy  Assessment and Plan: [name] is a 85 y.o. male presenting with L arm redness. PMH is significant for DM, Afib on pradaxa, aortic stenosis s/p TAVR, PVD, CAD s/p CABG, HTN, COPD, HLD, HFpEF, GERD.  Pulmonary embolism CTA chest performed on 6/21+ for pulmonary embolism.  Transitioned to Eliquis. -Eliquis 10 mg twice daily through 6/27 then decrease to 5 mg twice daily  Decompensated heart failure likely secondary to excessive volume from IV antibiotics 2.5 L urine output on 6/23, net -3.1 L for hospitalization.  0.3 L below admission weight.  And physical exam today continues to have 3+ lower extremity edema (baseline per patient report), breathing comfortably on 3 L nasal cannula. -Home regimen Lasix p.o. 40 mg - Lasix IV 40 mg twice daily - Strict I's and O's - Daily weights  COPD.  Improved air movement today.  Minimal wheezing noted on exam. -Prednisone 40 mg (6/22-6/26) -Incruse daily -Scheduled duo nebs every 8 hours -Albuterol nebs every 4 hours as needed  L arm cellulitis-improving S/p incision and drainage in the OR on 0/27 without complication.  Hydrotherapy performed on 6/22.  He will follow-up with orthopedics outpatient for further wound care. -DC Ancef (6/13-6/14) -DC vanc (6/14-6/16 due to concern for potential kidney injury -Unasyn (6/14 -18) - Augmentin ( started 6/18-26) for total of 14 days of treatment -Tylenol 650 every 6 hours scheduled -Oxycodone 5 mg every 6 hours scheduled -morphine 2 mg every 4 hours as needed -Dilaudid 1 mg given for  hydrotherapy -OT - outpatient follow up  A Fib.   Stable, poor rate control over the last 24 hours. With rates consistently above 100 - 130s. - IV Metop for rates over 140 -Eliquis as above -metoprolol 100mg  BID  Diabetes.  Last A1c viewable is 6.1 on 09/13/2017.  -holding metformin 500 mg daily -Monitor CBGs in the setting of acute infection  PVD, CAD s/p CABG, HLD -Continue home atorvastatin   GERD -Pantoprazole 40 mg daily  HFpEF, HTN  Stable - Lasix as above - terazosin 5 mg daily - metoprolol as per above  Anemia, iron deficiency -Iron supplements  Allergies -Loratadine 10 mg daily  FEN/GI: Heart healthy carb modified Prophylaxis:  Eliquis  Disposition: Potential DC home on 6/24, 6/25  Subjective:  No acute events overnight.  No new complaints this morning.  He reports that he is breathing more comfortably now and is able to breathe comfortably comfortably on his home oxygen requirement.  He denies shortness of breath, chest pain.  He reports that the edema present in his legs currently similar to what he usually experiences at home.  Objective: Temp:  [97.5 F (36.4 C)-98.2 F (36.8 C)] 97.5 F (36.4 C) (06/24 0507) Pulse Rate:  [74-100] 83 (06/24 0522) Resp:  [15-20] 20 (06/24 0507) BP: (121-175)/(44-85) 139/44 (06/24 0507) SpO2:  [96 %-100 %] 100 % (06/24 0522) FiO2 (%):  [36 %] 36 % (06/23 1305) Weight:  [90.4 kg] 90.4 kg (06/24 0507)  Physical Exam: General: Alert and cooperative and appears to be in no acute distress Cardio: Normal S1 and S2, no S3 or  S4. Rhythm is irregular. No murmurs or rubs.   Pulm: Breathing comfortably on 3 L nasal cannula.  Satting well.  Decreased air movement in right lung fields.  No wheezing/stridor noted. Abdomen: Bowel sounds normal. Abdomen soft and non-tender.  Extremities: 3+ peripheral edema (basline per pt). Warm/ well perfused.  Strong radial pulse. Neuro: Cranial nerves grossly intact   Laboratory: Recent  Labs  Lab 11/23/18 0413 11/24/18 0358 11/25/18 0605  WBC 4.9 4.9 5.1  HGB 10.5* 10.5* 10.2*  HCT 32.4* 32.0* 31.5*  PLT 221 218 225   Recent Labs  Lab 11/22/18 0330 11/23/18 0413 11/24/18 0358  NA 135 138 138  K 3.5 3.7 3.7  CL 100 100 100  CO2 28 28 30   BUN 13 12 12   CREATININE 0.84 0.82 0.80  CALCIUM 8.2* 8.6* 8.6*  GLUCOSE 140* 133* 113*    Imaging/Diagnostic Tests: No results found.  Matilde Haymaker, MD 11/26/2018, 6:22 AM PGY-1, [facility] Intern pager: [phone], text pages welcome

## 2018-11-27 ENCOUNTER — Encounter: Payer: Self-pay | Admitting: Thoracic Surgery (Cardiothoracic Vascular Surgery)

## 2018-11-28 DIAGNOSIS — L03119 Cellulitis of unspecified part of limb: Secondary | ICD-10-CM | POA: Diagnosis not present

## 2018-11-28 DIAGNOSIS — L02419 Cutaneous abscess of limb, unspecified: Secondary | ICD-10-CM | POA: Diagnosis not present

## 2018-11-28 DIAGNOSIS — M79602 Pain in left arm: Secondary | ICD-10-CM | POA: Diagnosis not present

## 2018-11-28 DIAGNOSIS — S41102A Unspecified open wound of left upper arm, initial encounter: Secondary | ICD-10-CM | POA: Diagnosis not present

## 2018-12-01 ENCOUNTER — Telehealth: Payer: Self-pay | Admitting: Cardiology

## 2018-12-01 NOTE — Telephone Encounter (Signed)
During recent hospitalization patient had a pulmonary embolus.  He was initiated on apixaban and has completed load of 10 mg twice daily and should now be on 5 mg twice daily.  Would continue this medication and stay off of Pradaxa for now.  Patient not have difficulties obtaining this from the [place].  Contact us with any difficulties.  Apixaban is just as good as Pradaxa. Kirk Ruths

## 2018-12-01 NOTE — Telephone Encounter (Addendum)
Spoke with pt wife, to change to the eliquis he would have to see the cardiologist at the [place] and they are not wanting to do that. They have enough tablets to last to follow up appointment with dr Stanford Breed. Questions regarding medications at discharge answered.

## 2018-12-01 NOTE — Telephone Encounter (Signed)
  Wife would like to speak to the nurse to discuss Mr Willow's Eliquis and Pradaxa

## 2018-12-01 NOTE — Telephone Encounter (Signed)
Patient's wife, [name], per DPR called stating that patient was discharged from the hospital 6 days ago with instructions to stop pradaxa and start eliquis. [name] is very concerned about this change as they have always received patient's "pradaxa through the [facility] is not going to approve this medication." [name] also wants further explanation as to why this medication change was made and wants Dr. Jacalyn Lefevre advisement. She would really like patient to go back on pradaxa if possible.   Will have Dr. Stanford Breed advise.

## 2018-12-02 DIAGNOSIS — S41102A Unspecified open wound of left upper arm, initial encounter: Secondary | ICD-10-CM | POA: Diagnosis not present

## 2018-12-02 DIAGNOSIS — L02419 Cutaneous abscess of limb, unspecified: Secondary | ICD-10-CM | POA: Diagnosis not present

## 2018-12-02 DIAGNOSIS — L03119 Cellulitis of unspecified part of limb: Secondary | ICD-10-CM | POA: Diagnosis not present

## 2018-12-02 DIAGNOSIS — M79602 Pain in left arm: Secondary | ICD-10-CM | POA: Diagnosis not present

## 2018-12-03 DIAGNOSIS — J441 Chronic obstructive pulmonary disease with (acute) exacerbation: Secondary | ICD-10-CM | POA: Diagnosis not present

## 2018-12-03 DIAGNOSIS — L03114 Cellulitis of left upper limb: Secondary | ICD-10-CM | POA: Diagnosis not present

## 2018-12-03 DIAGNOSIS — I2699 Other pulmonary embolism without acute cor pulmonale: Secondary | ICD-10-CM | POA: Diagnosis not present

## 2018-12-03 DIAGNOSIS — Z139 Encounter for screening, unspecified: Secondary | ICD-10-CM | POA: Diagnosis not present

## 2018-12-03 DIAGNOSIS — I5033 Acute on chronic diastolic (congestive) heart failure: Secondary | ICD-10-CM | POA: Diagnosis not present

## 2018-12-04 DIAGNOSIS — E1151 Type 2 diabetes mellitus with diabetic peripheral angiopathy without gangrene: Secondary | ICD-10-CM | POA: Diagnosis not present

## 2018-12-04 DIAGNOSIS — I0981 Rheumatic heart failure: Secondary | ICD-10-CM | POA: Diagnosis not present

## 2018-12-04 DIAGNOSIS — J441 Chronic obstructive pulmonary disease with (acute) exacerbation: Secondary | ICD-10-CM | POA: Diagnosis not present

## 2018-12-04 DIAGNOSIS — I110 Hypertensive heart disease with heart failure: Secondary | ICD-10-CM | POA: Diagnosis not present

## 2018-12-04 DIAGNOSIS — K279 Peptic ulcer, site unspecified, unspecified as acute or chronic, without hemorrhage or perforation: Secondary | ICD-10-CM | POA: Diagnosis not present

## 2018-12-04 DIAGNOSIS — I6522 Occlusion and stenosis of left carotid artery: Secondary | ICD-10-CM | POA: Diagnosis not present

## 2018-12-04 DIAGNOSIS — I5033 Acute on chronic diastolic (congestive) heart failure: Secondary | ICD-10-CM | POA: Diagnosis not present

## 2018-12-04 DIAGNOSIS — F419 Anxiety disorder, unspecified: Secondary | ICD-10-CM | POA: Diagnosis not present

## 2018-12-04 DIAGNOSIS — E1142 Type 2 diabetes mellitus with diabetic polyneuropathy: Secondary | ICD-10-CM | POA: Diagnosis not present

## 2018-12-04 DIAGNOSIS — R338 Other retention of urine: Secondary | ICD-10-CM | POA: Diagnosis not present

## 2018-12-04 DIAGNOSIS — E1165 Type 2 diabetes mellitus with hyperglycemia: Secondary | ICD-10-CM | POA: Diagnosis not present

## 2018-12-04 DIAGNOSIS — I051 Rheumatic mitral insufficiency: Secondary | ICD-10-CM | POA: Diagnosis not present

## 2018-12-04 DIAGNOSIS — F329 Major depressive disorder, single episode, unspecified: Secondary | ICD-10-CM | POA: Diagnosis not present

## 2018-12-04 DIAGNOSIS — I701 Atherosclerosis of renal artery: Secondary | ICD-10-CM | POA: Diagnosis not present

## 2018-12-04 DIAGNOSIS — M109 Gout, unspecified: Secondary | ICD-10-CM | POA: Diagnosis not present

## 2018-12-04 DIAGNOSIS — I4821 Permanent atrial fibrillation: Secondary | ICD-10-CM | POA: Diagnosis not present

## 2018-12-04 DIAGNOSIS — N401 Enlarged prostate with lower urinary tract symptoms: Secondary | ICD-10-CM | POA: Diagnosis not present

## 2018-12-04 DIAGNOSIS — I70209 Unspecified atherosclerosis of native arteries of extremities, unspecified extremity: Secondary | ICD-10-CM | POA: Diagnosis not present

## 2018-12-04 DIAGNOSIS — I2699 Other pulmonary embolism without acute cor pulmonale: Secondary | ICD-10-CM | POA: Diagnosis not present

## 2018-12-04 DIAGNOSIS — D509 Iron deficiency anemia, unspecified: Secondary | ICD-10-CM | POA: Diagnosis not present

## 2018-12-04 DIAGNOSIS — L03114 Cellulitis of left upper limb: Secondary | ICD-10-CM | POA: Diagnosis not present

## 2018-12-04 DIAGNOSIS — I25110 Atherosclerotic heart disease of native coronary artery with unstable angina pectoris: Secondary | ICD-10-CM | POA: Diagnosis not present

## 2018-12-04 DIAGNOSIS — D72829 Elevated white blood cell count, unspecified: Secondary | ICD-10-CM | POA: Diagnosis not present

## 2018-12-04 DIAGNOSIS — S61402D Unspecified open wound of left hand, subsequent encounter: Secondary | ICD-10-CM | POA: Diagnosis not present

## 2018-12-04 DIAGNOSIS — S50811D Abrasion of right forearm, subsequent encounter: Secondary | ICD-10-CM | POA: Diagnosis not present

## 2018-12-05 DIAGNOSIS — I0981 Rheumatic heart failure: Secondary | ICD-10-CM | POA: Diagnosis not present

## 2018-12-05 DIAGNOSIS — L03114 Cellulitis of left upper limb: Secondary | ICD-10-CM | POA: Diagnosis not present

## 2018-12-05 DIAGNOSIS — J441 Chronic obstructive pulmonary disease with (acute) exacerbation: Secondary | ICD-10-CM | POA: Diagnosis not present

## 2018-12-05 DIAGNOSIS — S61402D Unspecified open wound of left hand, subsequent encounter: Secondary | ICD-10-CM | POA: Diagnosis not present

## 2018-12-05 DIAGNOSIS — E1142 Type 2 diabetes mellitus with diabetic polyneuropathy: Secondary | ICD-10-CM | POA: Diagnosis not present

## 2018-12-05 DIAGNOSIS — I110 Hypertensive heart disease with heart failure: Secondary | ICD-10-CM | POA: Diagnosis not present

## 2018-12-08 DIAGNOSIS — L03114 Cellulitis of left upper limb: Secondary | ICD-10-CM | POA: Diagnosis not present

## 2018-12-08 DIAGNOSIS — I110 Hypertensive heart disease with heart failure: Secondary | ICD-10-CM | POA: Diagnosis not present

## 2018-12-08 DIAGNOSIS — J441 Chronic obstructive pulmonary disease with (acute) exacerbation: Secondary | ICD-10-CM | POA: Diagnosis not present

## 2018-12-08 DIAGNOSIS — I0981 Rheumatic heart failure: Secondary | ICD-10-CM | POA: Diagnosis not present

## 2018-12-08 DIAGNOSIS — E1142 Type 2 diabetes mellitus with diabetic polyneuropathy: Secondary | ICD-10-CM | POA: Diagnosis not present

## 2018-12-08 DIAGNOSIS — S61402D Unspecified open wound of left hand, subsequent encounter: Secondary | ICD-10-CM | POA: Diagnosis not present

## 2018-12-09 DIAGNOSIS — S61402D Unspecified open wound of left hand, subsequent encounter: Secondary | ICD-10-CM | POA: Diagnosis not present

## 2018-12-09 DIAGNOSIS — I110 Hypertensive heart disease with heart failure: Secondary | ICD-10-CM | POA: Diagnosis not present

## 2018-12-09 DIAGNOSIS — E1142 Type 2 diabetes mellitus with diabetic polyneuropathy: Secondary | ICD-10-CM | POA: Diagnosis not present

## 2018-12-09 DIAGNOSIS — L03114 Cellulitis of left upper limb: Secondary | ICD-10-CM | POA: Diagnosis not present

## 2018-12-09 DIAGNOSIS — J441 Chronic obstructive pulmonary disease with (acute) exacerbation: Secondary | ICD-10-CM | POA: Diagnosis not present

## 2018-12-09 DIAGNOSIS — I0981 Rheumatic heart failure: Secondary | ICD-10-CM | POA: Diagnosis not present

## 2018-12-10 ENCOUNTER — Other Ambulatory Visit (HOSPITAL_COMMUNITY): Payer: Medicare Other

## 2018-12-10 DIAGNOSIS — I110 Hypertensive heart disease with heart failure: Secondary | ICD-10-CM | POA: Diagnosis not present

## 2018-12-10 DIAGNOSIS — S61402D Unspecified open wound of left hand, subsequent encounter: Secondary | ICD-10-CM | POA: Diagnosis not present

## 2018-12-10 DIAGNOSIS — I0981 Rheumatic heart failure: Secondary | ICD-10-CM | POA: Diagnosis not present

## 2018-12-10 DIAGNOSIS — E1142 Type 2 diabetes mellitus with diabetic polyneuropathy: Secondary | ICD-10-CM | POA: Diagnosis not present

## 2018-12-10 DIAGNOSIS — J441 Chronic obstructive pulmonary disease with (acute) exacerbation: Secondary | ICD-10-CM | POA: Diagnosis not present

## 2018-12-10 DIAGNOSIS — L03114 Cellulitis of left upper limb: Secondary | ICD-10-CM | POA: Diagnosis not present

## 2018-12-12 DIAGNOSIS — S61402D Unspecified open wound of left hand, subsequent encounter: Secondary | ICD-10-CM | POA: Diagnosis not present

## 2018-12-12 DIAGNOSIS — L03114 Cellulitis of left upper limb: Secondary | ICD-10-CM | POA: Diagnosis not present

## 2018-12-12 DIAGNOSIS — J441 Chronic obstructive pulmonary disease with (acute) exacerbation: Secondary | ICD-10-CM | POA: Diagnosis not present

## 2018-12-12 DIAGNOSIS — I0981 Rheumatic heart failure: Secondary | ICD-10-CM | POA: Diagnosis not present

## 2018-12-12 DIAGNOSIS — E1142 Type 2 diabetes mellitus with diabetic polyneuropathy: Secondary | ICD-10-CM | POA: Diagnosis not present

## 2018-12-12 DIAGNOSIS — I110 Hypertensive heart disease with heart failure: Secondary | ICD-10-CM | POA: Diagnosis not present

## 2018-12-15 DIAGNOSIS — J441 Chronic obstructive pulmonary disease with (acute) exacerbation: Secondary | ICD-10-CM | POA: Diagnosis not present

## 2018-12-15 DIAGNOSIS — L03114 Cellulitis of left upper limb: Secondary | ICD-10-CM | POA: Diagnosis not present

## 2018-12-15 DIAGNOSIS — I0981 Rheumatic heart failure: Secondary | ICD-10-CM | POA: Diagnosis not present

## 2018-12-15 DIAGNOSIS — I110 Hypertensive heart disease with heart failure: Secondary | ICD-10-CM | POA: Diagnosis not present

## 2018-12-15 DIAGNOSIS — S61402D Unspecified open wound of left hand, subsequent encounter: Secondary | ICD-10-CM | POA: Diagnosis not present

## 2018-12-15 DIAGNOSIS — E1142 Type 2 diabetes mellitus with diabetic polyneuropathy: Secondary | ICD-10-CM | POA: Diagnosis not present

## 2018-12-17 ENCOUNTER — Emergency Department (HOSPITAL_COMMUNITY): Payer: Medicare Other

## 2018-12-17 ENCOUNTER — Inpatient Hospital Stay (HOSPITAL_COMMUNITY)
Admission: EM | Admit: 2018-12-17 | Discharge: 2018-12-22 | DRG: 291 | Disposition: A | Discharge status: Skilled Nursing Facility | Payer: Medicare Other | Attending: Family Medicine | Admitting: Family Medicine

## 2018-12-17 ENCOUNTER — Other Ambulatory Visit: Payer: Self-pay

## 2018-12-17 ENCOUNTER — Encounter (HOSPITAL_COMMUNITY): Payer: Self-pay | Admitting: *Deleted

## 2018-12-17 DIAGNOSIS — Z7952 Long term (current) use of systemic steroids: Secondary | ICD-10-CM

## 2018-12-17 DIAGNOSIS — Z20828 Contact with and (suspected) exposure to other viral communicable diseases: Secondary | ICD-10-CM | POA: Diagnosis present

## 2018-12-17 DIAGNOSIS — Z8349 Family history of other endocrine, nutritional and metabolic diseases: Secondary | ICD-10-CM

## 2018-12-17 DIAGNOSIS — I2510 Atherosclerotic heart disease of native coronary artery without angina pectoris: Secondary | ICD-10-CM | POA: Diagnosis present

## 2018-12-17 DIAGNOSIS — I252 Old myocardial infarction: Secondary | ICD-10-CM

## 2018-12-17 DIAGNOSIS — N400 Enlarged prostate without lower urinary tract symptoms: Secondary | ICD-10-CM | POA: Diagnosis present

## 2018-12-17 DIAGNOSIS — M2550 Pain in unspecified joint: Secondary | ICD-10-CM | POA: Diagnosis not present

## 2018-12-17 DIAGNOSIS — M109 Gout, unspecified: Secondary | ICD-10-CM | POA: Diagnosis present

## 2018-12-17 DIAGNOSIS — I10 Essential (primary) hypertension: Secondary | ICD-10-CM | POA: Diagnosis not present

## 2018-12-17 DIAGNOSIS — Z8249 Family history of ischemic heart disease and other diseases of the circulatory system: Secondary | ICD-10-CM

## 2018-12-17 DIAGNOSIS — Z7401 Bed confinement status: Secondary | ICD-10-CM | POA: Diagnosis not present

## 2018-12-17 DIAGNOSIS — I110 Hypertensive heart disease with heart failure: Principal | ICD-10-CM | POA: Diagnosis present

## 2018-12-17 DIAGNOSIS — J9601 Acute respiratory failure with hypoxia: Secondary | ICD-10-CM | POA: Diagnosis present

## 2018-12-17 DIAGNOSIS — Z86711 Personal history of pulmonary embolism: Secondary | ICD-10-CM

## 2018-12-17 DIAGNOSIS — E785 Hyperlipidemia, unspecified: Secondary | ICD-10-CM | POA: Diagnosis present

## 2018-12-17 DIAGNOSIS — L03114 Cellulitis of left upper limb: Secondary | ICD-10-CM | POA: Diagnosis present

## 2018-12-17 DIAGNOSIS — R0602 Shortness of breath: Secondary | ICD-10-CM | POA: Diagnosis not present

## 2018-12-17 DIAGNOSIS — J90 Pleural effusion, not elsewhere classified: Secondary | ICD-10-CM | POA: Diagnosis not present

## 2018-12-17 DIAGNOSIS — R339 Retention of urine, unspecified: Secondary | ICD-10-CM | POA: Diagnosis present

## 2018-12-17 DIAGNOSIS — E11621 Type 2 diabetes mellitus with foot ulcer: Secondary | ICD-10-CM | POA: Diagnosis present

## 2018-12-17 DIAGNOSIS — E1151 Type 2 diabetes mellitus with diabetic peripheral angiopathy without gangrene: Secondary | ICD-10-CM | POA: Diagnosis present

## 2018-12-17 DIAGNOSIS — Z9981 Dependence on supplemental oxygen: Secondary | ICD-10-CM | POA: Diagnosis not present

## 2018-12-17 DIAGNOSIS — Z8711 Personal history of peptic ulcer disease: Secondary | ICD-10-CM

## 2018-12-17 DIAGNOSIS — L89312 Pressure ulcer of right buttock, stage 2: Secondary | ICD-10-CM | POA: Diagnosis present

## 2018-12-17 DIAGNOSIS — J449 Chronic obstructive pulmonary disease, unspecified: Secondary | ICD-10-CM | POA: Diagnosis not present

## 2018-12-17 DIAGNOSIS — I509 Heart failure, unspecified: Secondary | ICD-10-CM

## 2018-12-17 DIAGNOSIS — J9690 Respiratory failure, unspecified, unspecified whether with hypoxia or hypercapnia: Secondary | ICD-10-CM | POA: Diagnosis not present

## 2018-12-17 DIAGNOSIS — I5033 Acute on chronic diastolic (congestive) heart failure: Secondary | ICD-10-CM | POA: Diagnosis present

## 2018-12-17 DIAGNOSIS — Z7901 Long term (current) use of anticoagulants: Secondary | ICD-10-CM

## 2018-12-17 DIAGNOSIS — Z951 Presence of aortocoronary bypass graft: Secondary | ICD-10-CM

## 2018-12-17 DIAGNOSIS — Z7984 Long term (current) use of oral hypoglycemic drugs: Secondary | ICD-10-CM

## 2018-12-17 DIAGNOSIS — G8929 Other chronic pain: Secondary | ICD-10-CM | POA: Diagnosis present

## 2018-12-17 DIAGNOSIS — J441 Chronic obstructive pulmonary disease with (acute) exacerbation: Secondary | ICD-10-CM | POA: Diagnosis present

## 2018-12-17 DIAGNOSIS — K5900 Constipation, unspecified: Secondary | ICD-10-CM | POA: Diagnosis not present

## 2018-12-17 DIAGNOSIS — L8990 Pressure ulcer of unspecified site, unspecified stage: Secondary | ICD-10-CM | POA: Insufficient documentation

## 2018-12-17 DIAGNOSIS — R05 Cough: Secondary | ICD-10-CM | POA: Diagnosis not present

## 2018-12-17 DIAGNOSIS — R278 Other lack of coordination: Secondary | ICD-10-CM | POA: Diagnosis not present

## 2018-12-17 DIAGNOSIS — I701 Atherosclerosis of renal artery: Secondary | ICD-10-CM | POA: Diagnosis present

## 2018-12-17 DIAGNOSIS — G4730 Sleep apnea, unspecified: Secondary | ICD-10-CM | POA: Diagnosis present

## 2018-12-17 DIAGNOSIS — M549 Dorsalgia, unspecified: Secondary | ICD-10-CM | POA: Diagnosis present

## 2018-12-17 DIAGNOSIS — R2681 Unsteadiness on feet: Secondary | ICD-10-CM | POA: Diagnosis not present

## 2018-12-17 DIAGNOSIS — I482 Chronic atrial fibrillation, unspecified: Secondary | ICD-10-CM | POA: Diagnosis present

## 2018-12-17 DIAGNOSIS — K219 Gastro-esophageal reflux disease without esophagitis: Secondary | ICD-10-CM | POA: Diagnosis present

## 2018-12-17 DIAGNOSIS — R062 Wheezing: Secondary | ICD-10-CM | POA: Diagnosis not present

## 2018-12-17 DIAGNOSIS — I25810 Atherosclerosis of coronary artery bypass graft(s) without angina pectoris: Secondary | ICD-10-CM | POA: Diagnosis not present

## 2018-12-17 DIAGNOSIS — M6281 Muscle weakness (generalized): Secondary | ICD-10-CM | POA: Diagnosis not present

## 2018-12-17 DIAGNOSIS — R0600 Dyspnea, unspecified: Secondary | ICD-10-CM

## 2018-12-17 DIAGNOSIS — R52 Pain, unspecified: Secondary | ICD-10-CM | POA: Diagnosis not present

## 2018-12-17 DIAGNOSIS — Z79899 Other long term (current) drug therapy: Secondary | ICD-10-CM

## 2018-12-17 DIAGNOSIS — Z8701 Personal history of pneumonia (recurrent): Secondary | ICD-10-CM

## 2018-12-17 DIAGNOSIS — Z952 Presence of prosthetic heart valve: Secondary | ICD-10-CM

## 2018-12-17 DIAGNOSIS — Z881 Allergy status to other antibiotic agents status: Secondary | ICD-10-CM

## 2018-12-17 DIAGNOSIS — I517 Cardiomegaly: Secondary | ICD-10-CM | POA: Diagnosis not present

## 2018-12-17 DIAGNOSIS — L89610 Pressure ulcer of right heel, unstageable: Secondary | ICD-10-CM | POA: Diagnosis present

## 2018-12-17 DIAGNOSIS — Z87891 Personal history of nicotine dependence: Secondary | ICD-10-CM

## 2018-12-17 DIAGNOSIS — E119 Type 2 diabetes mellitus without complications: Secondary | ICD-10-CM | POA: Diagnosis not present

## 2018-12-17 DIAGNOSIS — Z209 Contact with and (suspected) exposure to unspecified communicable disease: Secondary | ICD-10-CM | POA: Diagnosis not present

## 2018-12-17 DIAGNOSIS — M62838 Other muscle spasm: Secondary | ICD-10-CM | POA: Diagnosis not present

## 2018-12-17 HISTORY — DX: Pressure ulcer of unspecified site, unspecified stage: L89.90

## 2018-12-17 LAB — CBC WITH DIFFERENTIAL/PLATELET
Abs Immature Granulocytes: 0.02 10*3/uL (ref 0.00–0.07)
Basophils Absolute: 0.1 10*3/uL (ref 0.0–0.1)
Basophils Relative: 1 %
Eosinophils Absolute: 0.3 10*3/uL (ref 0.0–0.5)
Eosinophils Relative: 4 %
HCT: 33.1 % — ABNORMAL LOW (ref 39.0–52.0)
Hemoglobin: 10.4 g/dL — ABNORMAL LOW (ref 13.0–17.0)
Immature Granulocytes: 0 %
Lymphocytes Relative: 12 %
Lymphs Abs: 0.9 10*3/uL (ref 0.7–4.0)
MCH: 28.8 pg (ref 26.0–34.0)
MCHC: 31.4 g/dL (ref 30.0–36.0)
MCV: 91.7 fL (ref 80.0–100.0)
Monocytes Absolute: 0.8 10*3/uL (ref 0.1–1.0)
Monocytes Relative: 10 %
Neutro Abs: 5.9 10*3/uL (ref 1.7–7.7)
Neutrophils Relative %: 73 %
Platelets: 203 10*3/uL (ref 150–400)
RBC: 3.61 MIL/uL — ABNORMAL LOW (ref 4.22–5.81)
RDW: 16.3 % — ABNORMAL HIGH (ref 11.5–15.5)
WBC: 8 10*3/uL (ref 4.0–10.5)
nRBC: 0 % (ref 0.0–0.2)

## 2018-12-17 LAB — POCT I-STAT 7, (LYTES, BLD GAS, ICA,H+H)
Acid-Base Excess: 7 mmol/L — ABNORMAL HIGH (ref 0.0–2.0)
Bicarbonate: 31.9 mmol/L — ABNORMAL HIGH (ref 20.0–28.0)
Calcium, Ion: 1.19 mmol/L (ref 1.15–1.40)
HCT: 27 % — ABNORMAL LOW (ref 39.0–52.0)
Hemoglobin: 9.2 g/dL — ABNORMAL LOW (ref 13.0–17.0)
O2 Saturation: 93 %
Potassium: 3.5 mmol/L (ref 3.5–5.1)
Sodium: 137 mmol/L (ref 135–145)
TCO2: 33 mmol/L — ABNORMAL HIGH (ref 22–32)
pCO2 arterial: 48.3 mmHg — ABNORMAL HIGH (ref 32.0–48.0)
pH, Arterial: 7.428 (ref 7.350–7.450)
pO2, Arterial: 68 mmHg — ABNORMAL LOW (ref 83.0–108.0)

## 2018-12-17 LAB — HEPATIC FUNCTION PANEL
ALT: 25 U/L (ref 0–44)
AST: 22 U/L (ref 15–41)
Albumin: 3 g/dL — ABNORMAL LOW (ref 3.5–5.0)
Alkaline Phosphatase: 138 U/L — ABNORMAL HIGH (ref 38–126)
Bilirubin, Direct: 0.3 mg/dL — ABNORMAL HIGH (ref 0.0–0.2)
Indirect Bilirubin: 0.4 mg/dL (ref 0.3–0.9)
Total Bilirubin: 0.7 mg/dL (ref 0.3–1.2)
Total Protein: 5.6 g/dL — ABNORMAL LOW (ref 6.5–8.1)

## 2018-12-17 LAB — I-STAT CHEM 8, ED
BUN: 7 mg/dL — ABNORMAL LOW (ref 8–23)
Calcium, Ion: 1.25 mmol/L (ref 1.15–1.40)
Chloride: 94 mmol/L — ABNORMAL LOW (ref 98–111)
Creatinine, Ser: 0.6 mg/dL — ABNORMAL LOW (ref 0.61–1.24)
Glucose, Bld: 167 mg/dL — ABNORMAL HIGH (ref 70–99)
HCT: 32 % — ABNORMAL LOW (ref 39.0–52.0)
Hemoglobin: 10.9 g/dL — ABNORMAL LOW (ref 13.0–17.0)
Potassium: 4.3 mmol/L (ref 3.5–5.1)
Sodium: 136 mmol/L (ref 135–145)
TCO2: 33 mmol/L — ABNORMAL HIGH (ref 22–32)

## 2018-12-17 LAB — HEMOGLOBIN A1C
Hgb A1c MFr Bld: 6.1 % — ABNORMAL HIGH (ref 4.8–5.6)
Mean Plasma Glucose: 128.37 mg/dL

## 2018-12-17 LAB — BRAIN NATRIURETIC PEPTIDE: B Natriuretic Peptide: 310.9 pg/mL — ABNORMAL HIGH (ref 0.0–100.0)

## 2018-12-17 LAB — GLUCOSE, CAPILLARY
Glucose-Capillary: 122 mg/dL — ABNORMAL HIGH (ref 70–99)
Glucose-Capillary: 105 mg/dL — ABNORMAL HIGH (ref 70–99)

## 2018-12-17 LAB — TROPONIN I (HIGH SENSITIVITY)
Troponin I (High Sensitivity): 12 ng/L (ref <18)
Troponin I (High Sensitivity): 15 ng/L (ref <18)

## 2018-12-17 LAB — SARS CORONAVIRUS 2 BY RT PCR (HOSPITAL ORDER, PERFORMED IN ~~LOC~~ HOSPITAL LAB): SARS Coronavirus 2: NEGATIVE

## 2018-12-17 MED ORDER — INSULIN ASPART 100 UNIT/ML ~~LOC~~ SOLN
0.0000 [IU] | Freq: Three times a day (TID) | SUBCUTANEOUS | Status: DC
  Administered 2018-12-18: 2 [IU] via SUBCUTANEOUS
  Administered 2018-12-18 – 2018-12-19 (×2): 1 [IU] via SUBCUTANEOUS
  Administered 2018-12-19: 13:00:00 3 [IU] via SUBCUTANEOUS
  Administered 2018-12-19 – 2018-12-20 (×2): 1 [IU] via SUBCUTANEOUS
  Administered 2018-12-20 (×2): 3 [IU] via SUBCUTANEOUS
  Administered 2018-12-21: 2 [IU] via SUBCUTANEOUS
  Administered 2018-12-21 (×2): 1 [IU] via SUBCUTANEOUS
  Administered 2018-12-22: 2 [IU] via SUBCUTANEOUS
  Administered 2018-12-22 (×2): 1 [IU] via SUBCUTANEOUS

## 2018-12-17 MED ORDER — BRIMONIDINE TARTRATE 0.2 % OP SOLN
1.0000 [drp] | Freq: Two times a day (BID) | OPHTHALMIC | Status: DC
  Administered 2018-12-17 – 2018-12-22 (×10): 1 [drp] via OPHTHALMIC
  Filled 2018-12-17: qty 5

## 2018-12-17 MED ORDER — APIXABAN 5 MG PO TABS
5.0000 mg | ORAL_TABLET | Freq: Two times a day (BID) | ORAL | Status: DC
  Administered 2018-12-17 – 2018-12-22 (×10): 5 mg via ORAL
  Filled 2018-12-17 (×10): qty 1

## 2018-12-17 MED ORDER — ADULT MULTIVITAMIN W/MINERALS CH
1.0000 | ORAL_TABLET | Freq: Every day | ORAL | Status: DC
  Administered 2018-12-18 – 2018-12-22 (×5): 1 via ORAL
  Filled 2018-12-17 (×5): qty 1

## 2018-12-17 MED ORDER — SODIUM CHLORIDE 0.9 % IV SOLN: 250.0000 mL | INTRAVENOUS | Status: DC | PRN

## 2018-12-17 MED ORDER — SODIUM CHLORIDE 0.9% FLUSH: 3.0000 mL | INTRAVENOUS | Status: DC | PRN

## 2018-12-17 MED ORDER — METHOCARBAMOL 750 MG PO TABS
750.0000 mg | ORAL_TABLET | Freq: Two times a day (BID) | ORAL | Status: DC | PRN
  Administered 2018-12-18 – 2018-12-20 (×2): 750 mg via ORAL
  Filled 2018-12-17 (×2): qty 1

## 2018-12-17 MED ORDER — ORAL CARE MOUTH RINSE
15.0000 mL | Freq: Two times a day (BID) | OROMUCOSAL | Status: DC
  Administered 2018-12-18 – 2018-12-22 (×10): 15 mL via OROMUCOSAL

## 2018-12-17 MED ORDER — FERROUS SULFATE 325 (65 FE) MG PO TABS
325.0000 mg | ORAL_TABLET | Freq: Every day | ORAL | Status: DC
  Administered 2018-12-18 – 2018-12-22 (×5): 325 mg via ORAL
  Filled 2018-12-17 (×5): qty 1

## 2018-12-17 MED ORDER — TERAZOSIN HCL 5 MG PO CAPS
5.0000 mg | ORAL_CAPSULE | Freq: Every day | ORAL | Status: DC
  Administered 2018-12-17 – 2018-12-20 (×4): 5 mg via ORAL
  Filled 2018-12-17 (×4): qty 1

## 2018-12-17 MED ORDER — ACETAMINOPHEN 325 MG PO TABS
650.0000 mg | ORAL_TABLET | ORAL | Status: DC | PRN
  Administered 2018-12-18: 650 mg via ORAL
  Filled 2018-12-17 (×2): qty 2

## 2018-12-17 MED ORDER — FUROSEMIDE 10 MG/ML IJ SOLN
40.0000 mg | Freq: Once | INTRAMUSCULAR | Status: AC
  Administered 2018-12-17: 07:00:00 40 mg via INTRAVENOUS
  Filled 2018-12-17: qty 4

## 2018-12-17 MED ORDER — UMECLIDINIUM BROMIDE 62.5 MCG/INH IN AEPB
1.0000 | INHALATION_SPRAY | Freq: Every day | RESPIRATORY_TRACT | Status: DC
  Administered 2018-12-18 – 2018-12-22 (×4): 1 via RESPIRATORY_TRACT
  Filled 2018-12-17: qty 7

## 2018-12-17 MED ORDER — SODIUM CHLORIDE 0.9% FLUSH
3.0000 mL | Freq: Two times a day (BID) | INTRAVENOUS | Status: DC
  Administered 2018-12-17 – 2018-12-22 (×11): 3 mL via INTRAVENOUS

## 2018-12-17 MED ORDER — PANTOPRAZOLE SODIUM 40 MG PO TBEC
40.0000 mg | DELAYED_RELEASE_TABLET | Freq: Every day | ORAL | Status: DC
  Administered 2018-12-18 – 2018-12-22 (×5): 40 mg via ORAL
  Filled 2018-12-17 (×5): qty 1

## 2018-12-17 MED ORDER — FUROSEMIDE 10 MG/ML IJ SOLN
40.0000 mg | Freq: Two times a day (BID) | INTRAMUSCULAR | Status: DC
  Administered 2018-12-17 – 2018-12-20 (×6): 40 mg via INTRAVENOUS
  Filled 2018-12-17 (×6): qty 4

## 2018-12-17 MED ORDER — ALBUTEROL SULFATE (2.5 MG/3ML) 0.083% IN NEBU
2.5000 mg | INHALATION_SOLUTION | RESPIRATORY_TRACT | Status: DC | PRN
  Administered 2018-12-19: 2.5 mg via RESPIRATORY_TRACT
  Filled 2018-12-17: qty 3

## 2018-12-17 MED ORDER — FLUTICASONE FUROATE-VILANTEROL 100-25 MCG/INH IN AEPB
1.0000 | INHALATION_SPRAY | Freq: Every day | RESPIRATORY_TRACT | Status: DC
  Administered 2018-12-18 – 2018-12-22 (×4): 1 via RESPIRATORY_TRACT
  Filled 2018-12-17: qty 28

## 2018-12-17 MED ORDER — ALBUTEROL SULFATE HFA 108 (90 BASE) MCG/ACT IN AERS
8.0000 | INHALATION_SPRAY | Freq: Once | RESPIRATORY_TRACT | Status: AC
  Administered 2018-12-17: 8 via RESPIRATORY_TRACT
  Filled 2018-12-17: qty 6.7

## 2018-12-17 MED ORDER — CHLORHEXIDINE GLUCONATE 0.12 % MT SOLN
15.0000 mL | Freq: Two times a day (BID) | OROMUCOSAL | Status: DC
  Administered 2018-12-17 – 2018-12-22 (×10): 15 mL via OROMUCOSAL
  Filled 2018-12-17 (×9): qty 15

## 2018-12-17 MED ORDER — ALBUTEROL 90 MCG/ACT IN AERS: 2.0000 | INHALATION_SPRAY | RESPIRATORY_TRACT | Status: DC | PRN

## 2018-12-17 MED ORDER — ATORVASTATIN CALCIUM 80 MG PO TABS
80.0000 mg | ORAL_TABLET | Freq: Every day | ORAL | Status: DC
  Administered 2018-12-17 – 2018-12-21 (×5): 80 mg via ORAL
  Filled 2018-12-17 (×5): qty 1

## 2018-12-17 MED ORDER — IOHEXOL 350 MG/ML SOLN
100.0000 mL | Freq: Once | INTRAVENOUS | Status: AC | PRN
  Administered 2018-12-17: 100 mL via INTRAVENOUS

## 2018-12-17 NOTE — H&P (Signed)
[facility] Admission History and Physical Service Pager: [phone]  Patient name: [name] Medical record number: [id number] Date of birth: May 24, 1932 Age: 85 y.o. Gender: male  Primary Care Provider: Nicoletta Dress, MD Consultants: None Code Status: Full code  Chief Complaint: Shortness of breath  Assessment and Plan: [name] is a 85 y.o. male presenting with decompensated heart failure. PMH is significant for HFpEF, CAD s/p CABG (1994), s/p TAVR, A-fib, COPD, recent PE (11/2018), hospitalization 6/20 for cellulitis of left forearm, iron deficiency anemia  Acute hypoxic respiratory failure secondary to acute on chronic heart failure with preserved ejection fraction (EF 50-55% 11/24/2018) Who presents to the ED today with progressive shortness of breath which is been persistent since his hospital discharge on 6/22.  He feels significant fatigue and is now forced to sleep in an arm chair as opposed to his bed.  His physical exam is notable for respiratory distress, lower extremity edema, decreased breath sounds in lower lung fields with rales in the middle fields bilaterally.  Vitals on admission are remarkable for tachycardia up to 110, tachypnea up to 30.  Admission labs are notable for BNP 310 (previous high 245), WBC 8, creatinine 0.8, potassium 4.3.  ABG: 7.42/48/68.  Chest x-ray shows cardiomegaly with pleural effusions bilaterally.  CTA chest shows no evidence of PE with moderate bilateral pleural effusions and associated atelectasis.  The differential for his shortness of breath at this time includes decompensated heart failure, pneumonia, acute PE, COPD exacerbation.  Infectious etiology less likely based on lack of fever and no white count.  Acute PE unlikely based on negative CTA chest and current anticoagulation on apixaban.  COPD exacerbation less likely based on physical exam without wheezing.  Heart failure exacerbation is most likely at  this time.  Previous discharge instructions recommend increasing Lasix to p.o. 60 mg daily while patient reports taking only 40 mg daily.  Volume overload is likely secondary to insufficient diuretic use at home. -Admit to progressive, attending Dr. Ardelia Mems -Monitor telemetry -A.m. EKG -Lasix IV 40 mg twice daily -Hold home beta-blocker and ACE inhibitor -Monitor daily BMP -Replete potassium PRN -Defer echo due to recent echo 6/22 -Strict I's and O's -Daily weights -Consider consult cardiology if worsening -PT/OT -Trial off of BiPAP in PM  A. fib, rate controlled, chronic He reports occasionally feeling palpitations and denies chest pain.  His heart rate has been well controlled during his hospitalization so far with heart rate under 110 for majority of the time.  Heart rate may increase with the discontinuation of home metoprolol. -Monitor telemetry -Continue apixaban 5 mg twice daily -Hold metoprolol due to decompensated heart failure  CAD, s/p CABG (1994) Denies chest pain on admission.  EKG showing A. fib with left bundle branch block.  Q waves in inferior leads are noted in previous EKGs.  Poor quality EKG, will repeat tomorrow morning. -Continue atorvastatin 80 mg daily -Monitor for chest pain  Diabetes, type II Home medication includes metformin XR 500 daily. -SSI sensitive -Hold home metformin -Monitor CBGs  Left forearm cellulitis, improved, stable Previous hospitalization for left arm cellulitis.  Not significantly improved s/p surgical I&D with follow-up with surgery for debridement.  No signs of erythema or drainage on exam today.  No antibiotics at this time.  Not currently taking pain medication. -Continue to monitor  COPD, chronic, controlled He endorses shortness of breath there is no apparent wheezing on physical exam.  Low suspicion for infectious exacerbation of heart  failure.  Low suspicion for COPD exacerbation at this time. -Continue home Incruse  Ellipta -Start Brio Ellipta, substitute for Symbicort -Continue home albuterol every 4 hours as needed for wheezing  BPH -Continue home terazosin  FEN/GI: Heart healthy/carb modified Prophylaxis: Continue apixaban  Disposition: 2-3 days of hospitalization anticipated prior to discharge  History of Present Illness: [name] is a 85 y.o. male presenting with decompensated heart failure. PMH is significant for HFpEF, CAD s/p CABG (1994), s/p TAVR, COPD, recent PE (11/2018), hospitalization 6/20 for cellulitis of left forearm, iron deficiency anemia.  [name] was recently discharged from the hospital on 11/24/2018 following hospitalization for left arm cellulitis (requiring I/D and debridement), decompensated heart failure, COPD exacerbation, acute PE.  He reports that he is continued to experience shortness of breath following his hospitalization.  His shortness of breath has slowly worsened over the past month.  He reports that now he has significant trouble moving around his house and spends most of his time seated.  He also reports that he is not currently able to lie flat at night.  He typically sleeps in his bed when he is breathing comfortably though he has been sleeping in an arm chair for the past several weeks.  He denies chest pain but he does have occasional palpitations.  He reports that EMS was called today by his wife because of his wife's increasing concern regarding his respiratory status.  On review of systems, he denied fever, chest pain, sick contacts.  He endorsed coughing with clear sputum production, progressive shortness of breath.  In the ED, he was found to have an elevated BNP.  CTA was negative for PE and did show moderate bilateral pleural effusions.  He remained tachypneic on 3 L nasal cannula.  Review Of Systems: Per HPI with the following additions:   Review of Systems  Constitutional: Positive for malaise/fatigue. Negative for chills, fever and weight  loss.  HENT: Positive for congestion. Negative for hearing loss and sore throat.   Eyes: Negative for blurred vision.  Respiratory: Positive for cough, sputum production and shortness of breath. Negative for hemoptysis and wheezing.   Cardiovascular: Positive for palpitations and leg swelling (chronic, L>R). Negative for chest pain.  Gastrointestinal: Negative for abdominal pain, blood in stool, constipation, diarrhea, melena, nausea and vomiting.  Genitourinary: Negative for frequency.  Musculoskeletal: Negative for myalgias.  Skin: Negative for rash.  Neurological: Negative for tremors, focal weakness and headaches.  Endo/Heme/Allergies: Bruises/bleeds easily.    Patient Active Problem List   Diagnosis Date Noted  . CHF (congestive heart failure) ([place]) 12/17/2018  . Hypoxemia   . Pulmonary embolism and infarction ([place])   . Acute on chronic respiratory failure with hypoxia ([place])   . SOB (shortness of breath)   . Cellulitis 11/15/2018  . 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 Severe aortic stenosis 09/17/2017  . Mitral stenosis   . Aftercare following surgery of the circulatory system 03/15/2014  . Carotid stenosis 03/15/2014  . Unstable angina ([place]) 08/31/2013  . Acute myocardial infarction, unspecified site, initial episode of care   . Aortic stenosis   . Atrial fibrillation ([place])   . Vertigo 06/27/2013  . Chest pain with moderate risk of acute coronary syndrome 06/27/2013  . CAD - CABG '94, low risk Myoview May 2014 06/27/2013  . Chronic atrial fibrillation 06/27/2013  . Chronic anticoagulation 06/27/2013  . Gout attack- on steroid dose pack 06/27/2013  . Tachycardia- beta blocker increased 06/26/2013  . Essential  hypertension, benign 03/10/2009  . HYPERLIPIDEMIA 09/09/2008  . UNSPECIFIED ANEMIA 09/09/2008  . CAROTID STENOSIS- moderate 09/09/2008  . UNSPECIFIED CEREBROVASCULAR DISEASE 09/09/2008  . RENAL ARTERY STENOSIS- moderate 09/09/2008  .  PERIPHERAL VASCULAR DISEASE 09/09/2008  . PEPTIC ULCER DISEASE, HX OF 09/09/2008    Past Medical History: Past Medical History:  Diagnosis Date  . Acute myocardial infarction, unspecified site, initial episode of care   . Anemia   . Anxiety   . Aortic stenosis    . Atrial fibrillation ([place])    a. permanent, on Coumadin for anticoagulation  . CAD (coronary artery disease)    a. s/p CABG in 1994 b. cath in 2015 showing normal LM, 100% LCx and RCA stenosis with 50-60% prox LAD stenosis and 100% mid-LAD stenosis; patent sequential SVG-OM2-OM3 and patent LIMA-LAD with PTCA of distal LAD via LIMA graft performed at that time  . Carotid stenosis   . CHF (congestive heart failure) ([place])   . COPD (chronic obstructive pulmonary disease) ([place])   . Depression   . Diabetes mellitus without complication ([place])    FASTING 98-120S  . GERD (gastroesophageal reflux disease)   . History of blood transfusion   . History of peptic ulcer disease   . Hyperlipidemia   . Hypertension   . Myocardial infarction ([place])    X2  . Peripheral vascular disease ([place])   . Pneumonia    HX OF  . Renal artery stenosis ([place])   . Sleep apnea    OXYGEN AT NIGHT 2L [place]    Past Surgical History: Past Surgical History:  Procedure Laterality Date  . AORTIC ARCH ANGIOGRAPHY N/A 08/14/2017   Procedure: AORTIC ARCH ANGIOGRAPHY;  Surgeon: Sherren Mocha, MD;  Location: [facility];  Service: Cardiovascular;  Laterality: N/A;  . CARDIAC CATHETERIZATION    . CAROTID ENDARTERECTOMY    . COLON SURGERY    . CORONARY ARTERY BYPASS GRAFT  1994  . ENDARTERECTOMY Right 01/05/2013   Procedure: ENDARTERECTOMY CAROTID;  Surgeon: Rosetta Posner, MD;  Location: [facility];  Service: Vascular;  Laterality: Right;  . INCISION AND DRAINAGE Left 11/16/2018   Procedure: INCISION AND DRAINAGE LEFT FOREARM/ARM;  Surgeon: Leanora Cover, MD;  Location: [facility];  Service: Orthopedics;  Laterality: Left;  . KNEE SURGERY  08/2003   left  knee/ARTHROSCOPIC  . LEFT HEART CATHETERIZATION WITH CORONARY/GRAFT ANGIOGRAM N/A 09/01/2013   Procedure: LEFT HEART CATHETERIZATION WITH Beatrix Fetters;  Surgeon: Sinclair Grooms, MD;  Location: [facility];  Service: Cardiovascular;  Laterality: N/A;  . PATCH ANGIOPLASTY Right 01/05/2013   Procedure: PATCH ANGIOPLASTY;  Surgeon: Rosetta Posner, MD;  Location: [facility];  Service: Vascular;  Laterality: Right;  . PERCUTANEOUS CORONARY STENT INTERVENTION (PCI-S)  09/01/2013   Procedure: PERCUTANEOUS CORONARY STENT INTERVENTION (PCI-S);  Surgeon: Sinclair Grooms, MD;  Location: [facility] CATH LAB;  Service: Cardiovascular;;  . removal of bleeding ulcer  1994  . RENAL ARTERY STENT     stenting of the left renal artery as well as a cutting balloon angioplasty for treatment of in-stent restenosis coronary artery bypass grafting in 1994.  [name] RIGHT/LEFT HEART CATH AND CORONARY/GRAFT ANGIOGRAPHY N/A 08/14/2017   Procedure: RIGHT/LEFT HEART CATH AND CORONARY/GRAFT ANGIOGRAPHY;  Surgeon: Sherren Mocha, MD;  Location: [facility];  Service: Cardiovascular;  Laterality: N/A;  . TEE WITHOUT CARDIOVERSION N/A 09/17/2017   Procedure: TRANSESOPHAGEAL ECHOCARDIOGRAM (TEE);  Surgeon: Sherren Mocha, MD;  Location: [facility];  Service: Open Heart Surgery;  Laterality:  N/A;    Social History: Social History   Tobacco Use  . Smoking status: Former Smoker    Quit date: 04/27/1993    Years since quitting: 25.6  . Smokeless tobacco: Never Used  Substance Use Topics  . Alcohol use: No    Alcohol/week: 0.0 standard drinks  . Drug use: No   Additional social history: lives at home with wife  Please also refer to relevant sections of EMR.  Family History: Family History  Problem Relation Age of Onset  . Coronary artery disease Mother   . Heart disease Mother        Before age 55  . Hyperlipidemia Mother   . Hypertension Mother   . Heart attack Mother   . Coronary artery disease Father   . Heart  disease Father        After age 64  . Heart attack Father   . Heart disease Sister        After age 31  . Heart disease Brother        After age 83  . Coronary artery disease Other        13 sibling, almost all have coronary disease and some with premature onset  . Heart disease Other        13 sibling, almost all have coronary disease and some with premature onset    Allergies and Medications: Allergies  Allergen Reactions  . Bactrim [Sulfamethoxazole-Trimethoprim] Other (See Comments)    "does not do anything for me"  . Levaquin [Levofloxacin In D5w] Diarrhea   No current facility-administered medications on file prior to encounter.    Current Outpatient Medications on File Prior to Encounter  Medication Sig Dispense Refill  . acetaminophen (TYLENOL) 500 MG tablet Take 1,000 mg by mouth every 6 (six) hours as needed for moderate pain or headache.     [name] acetaminophen-codeine (TYLENOL #3) 300-30 MG tablet Take 1 tablet by mouth 2 (two) times daily as needed for pain.    [name] acyclovir (ZOVIRAX) 800 MG tablet Take 400 mg by mouth 2 (two) times daily.     [name] albuterol (PROVENTIL,VENTOLIN) 90 MCG/ACT inhaler Inhale 2 puffs into the lungs every 4 (four) hours as needed for shortness of breath.     . allopurinol (ZYLOPRIM) 300 MG tablet Take 300 mg by mouth daily.    [name] apixaban (ELIQUIS) 5 MG TABS tablet Take 2 tablets (10 mg total) by mouth 2 (two) times daily. Take two tablets in the morning and two tablets in the evening through 6/27. Starting on 6/28, in the morning, take one tablet in the morning and one tablet in the evening. (Patient taking differently: Take 5 mg by mouth 2 (two) times daily. ) 64 tablet 0  . atorvastatin (LIPITOR) 80 MG tablet Take 1 tablet (80 mg total) by mouth at bedtime. 90 tablet 3  . benazepril (LOTENSIN) 20 MG tablet Take 10 mg by mouth daily.    . brimonidine (ALPHAGAN) 0.2 % ophthalmic solution Place 1 drop into both eyes 2 (two) times daily.    .  budesonide-formoterol (SYMBICORT) 80-4.5 MCG/ACT inhaler Inhale 1 puff into the lungs as needed for shortness of breath.    . ferrous sulfate 325 (65 FE) MG tablet Take 325 mg by mouth daily with breakfast.     . furosemide (LASIX) 20 MG tablet Take 3 tablets (60 mg total) by mouth daily. (Patient taking differently: Take 40 mg by mouth daily. ) 30 tablet 5  . ipratropium-albuterol (  DUONEB) 0.5-2.5 (3) MG/3ML SOLN Take 3 mLs by nebulization 3 (three) times daily.     . lansoprazole (PREVACID) 30 MG capsule Take 30 mg by mouth daily.      . metFORMIN (GLUCOPHAGE-XR) 500 MG 24 hr tablet Take 500 mg by mouth daily.    . methocarbamol (ROBAXIN) 750 MG tablet Take 750 mg by mouth 2 (two) times daily as needed for muscle spasms.    . metoprolol tartrate (LOPRESSOR) 50 MG tablet Take 2 tablets (100 mg total) by mouth 2 (two) times daily. (Patient taking differently: Take 50 mg by mouth 2 (two) times daily. ) 180 tablet 4  . Multiple Vitamin (MULTIVITAMIN WITH MINERALS) TABS Take 1 tablet by mouth daily.    . multivitamin-lutein (OCUVITE-LUTEIN) CAPS Take 1 capsule by mouth daily.    [name] terazosin (HYTRIN) 5 MG capsule Take 5 mg by mouth at bedtime.     . trolamine salicylate (ASPERCREME) 10 % cream Apply 1 application topically 4 (four) times daily as needed for muscle pain.     [name] umeclidinium bromide (INCRUSE ELLIPTA) 62.5 MCG/INH AEPB Inhale 1 puff into the lungs daily. 1 each 0    Objective: BP 132/64   Pulse 100   Temp 97.7 F (36.5 C) (Oral)   Resp (!) 31 Comment: Simultaneous filing. User may not have seen previous data.  Ht 6' (1.829 m)   Wt 88 kg   SpO2 100%   BMI 26.31 kg/m  Exam: Physical Exam Constitutional:      Appearance: He is well-developed and normal weight.  HENT:     Head: Normocephalic.  Eyes:     Pupils: Pupils are equal, round, and reactive to light.  Cardiovascular:     Rate and Rhythm: Tachycardia present. Rhythm irregular.     Pulses: Normal pulses.     Heart  sounds: Murmur present.  Pulmonary:     Effort: Tachypnea and respiratory distress present.     Breath sounds: No stridor. Examination of the right-middle field reveals rales. Examination of the left-middle field reveals rales. Examination of the right-lower field reveals decreased breath sounds. Examination of the left-lower field reveals decreased breath sounds. Decreased breath sounds and rales present. No wheezing.  Abdominal:     General: Bowel sounds are normal.     Palpations: Abdomen is soft.  Musculoskeletal:     Right lower leg: He exhibits no tenderness. Edema present.     Left lower leg: He exhibits no tenderness. Edema (left greater than right) present.  Skin:    General: Skin is warm and dry.     Capillary Refill: Capillary refill takes less than 2 seconds.     Findings: Ecchymosis present. No rash.     Comments: Left forearm appears well-healing without signs of infection.  No erythema or purulence noted.  Neurological:     General: No focal deficit present.     Mental Status: He is alert and oriented to person, place, and time.     Cranial Nerves: No cranial nerve deficit.  Psychiatric:        Mood and Affect: Mood normal.        Behavior: Behavior normal.      Labs and Imaging: CBC BMET  Recent Labs  Lab 12/17/18 0557  12/17/18 1128  WBC 8.0  --   --   HGB 10.4*   < > 9.2*  HCT 33.1*   < > 27.0*  PLT 203  --   --    < > =  values in this interval not displayed.   Recent Labs  Lab 12/17/18 0613 12/17/18 1128  NA 136 137  K 4.3 3.5  CL 94*  --   BUN 7*  --   CREATININE 0.60*  --   GLUCOSE 167*  --      EKG: A. fib, LBBB  Ct Angio Chest Pe W And/or Wo Contrast  Result Date: 12/17/2018 CLINICAL DATA:  Shortness of breath. EXAM: CT ANGIOGRAPHY CHEST WITH CONTRAST TECHNIQUE: Multidetector CT imaging of the chest was performed using the standard protocol during bolus administration of intravenous contrast. Multiplanar CT image reconstructions and MIPs  were obtained to evaluate the vascular anatomy. CONTRAST:  115mL OMNIPAQUE IOHEXOL 350 MG/ML SOLN COMPARISON:  CT scan of November 23, 2018. FINDINGS: Cardiovascular: Satisfactory opacification of the pulmonary arteries to the segmental level. No evidence of pulmonary embolism. Mild cardiomegaly is noted. Status post transcatheter aortic valve repair. Coronary artery calcifications are noted. Status post coronary bypass graft. Atherosclerosis of thoracic aorta is noted without aneurysm formation. No pericardial effusion. Mediastinum/Nodes: No enlarged mediastinal, hilar, or axillary lymph nodes. Thyroid gland, trachea, and esophagus demonstrate no significant findings. Lungs/Pleura: No pneumothorax is noted. Moderate size bilateral pleural effusions are noted with adjacent subsegmental atelectasis of both lower lobes. Stable mild left upper lobe subsegmental atelectasis or scarring is noted. Upper Abdomen: No acute abnormality. Musculoskeletal: No chest wall abnormality. No acute or significant osseous findings. Review of the MIP images confirms the above findings. IMPRESSION: No definite evidence of pulmonary embolus. Moderate bilateral pleural effusions are noted with adjacent subsegmental atelectasis. Status post coronary bypass grafting and transcatheter aortic valve replacement. Aortic Atherosclerosis (ICD10-I70.0).1 Electronically Signed   By: Marijo Conception M.D.   On: 12/17/2018 09:49   Dg Chest Portable 1 View  Result Date: 12/17/2018 CLINICAL DATA:  Shortness of breath for 3 days EXAM: PORTABLE CHEST 1 VIEW COMPARISON:  11/22/2018 FINDINGS: Chronic moderate pleural effusions obscuring the lower lungs. Chronic cardiomegaly status post transcatheter aortic valve replacement. No Kerley lines or air bronchogram. No pneumothorax IMPRESSION: Cardiomegaly and moderate pleural effusions also seen on imaging in June 2020. Electronically Signed   By: Monte Fantasia M.D.   On: 12/17/2018 06:31      Matilde Haymaker, MD 12/17/2018, 11:48 AM PGY-2, [place] [facility] pager: [phone], text pages welcome

## 2018-12-17 NOTE — ED Provider Notes (Signed)
[facility] EMERGENCY DEPARTMENT Provider Note   CSN: [id number] Arrival date & time: 12/17/18  0551     History   Chief Complaint Chief Complaint  Patient presents with   Shortness of Breath    HPI [name] is a 85 y.o. male.     The history is provided by the patient.  Shortness of Breath Severity:  Severe Onset quality:  Gradual Duration:  3 days Timing:  Constant Progression:  Worsening Chronicity:  Recurrent Context: URI   Context: not activity, not animal exposure and not emotional upset   Relieved by:  Nothing Worsened by:  Nothing Ineffective treatments:  None tried Associated symptoms: cough   Associated symptoms: no abdominal pain, no diaphoresis, no fever, no hemoptysis, no neck pain, no PND, no syncope, no swollen glands, no vomiting and no wheezing   Cough:    Cough characteristics:  Productive   Sputum characteristics:  Nondescript   Severity:  Moderate   Onset quality:  Gradual   Duration:  3 days   Progression:  Unchanged   Chronicity:  New Risk factors: obesity and recent surgery   Risk factors: no recent alcohol use and no prolonged immobilization     Past Medical History:  Diagnosis Date   Acute myocardial infarction, unspecified site, initial episode of care    Anemia    Anxiety    Aortic stenosis     Atrial fibrillation (HCC)    a. permanent, on Coumadin for anticoagulation   CAD (coronary artery disease)    a. s/p CABG in 1994 b. cath in 2015 showing normal LM, 100% LCx and RCA stenosis with 50-60% prox LAD stenosis and 100% mid-LAD stenosis; patent sequential SVG-OM2-OM3 and patent LIMA-LAD with PTCA of distal LAD via LIMA graft performed at that time   Carotid stenosis    CHF (congestive heart failure) (HCC)    COPD (chronic obstructive pulmonary disease) (HCC)    Depression    Diabetes mellitus without complication ([place])    FASTING 98-120S   GERD (gastroesophageal reflux disease)    History  of blood transfusion    History of peptic ulcer disease    Hyperlipidemia    Hypertension    Myocardial infarction ([place])    X2   Peripheral vascular disease ([place])    Pneumonia    HX OF   Renal artery stenosis (HCC)    Sleep apnea    OXYGEN AT NIGHT 2L [place]    Patient Active Problem List   Diagnosis Date Noted   Hypoxemia    Pulmonary embolism and infarction (HCC)    Acute on chronic respiratory failure with hypoxia (HCC)    SOB (shortness of breath)    Cellulitis 11/15/2018   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   Severe aortic stenosis 09/17/2017   Mitral stenosis    Aftercare following surgery of the circulatory system 03/15/2014   Carotid stenosis 03/15/2014   Unstable angina ([place]) 08/31/2013   Acute myocardial infarction, unspecified site, initial episode of care    Aortic stenosis    Atrial fibrillation ([place])    Vertigo 06/27/2013   Chest pain with moderate risk of acute coronary syndrome 06/27/2013   CAD - CABG '94, low risk Myoview May 2014 06/27/2013   Chronic atrial fibrillation 06/27/2013   Chronic anticoagulation 06/27/2013   Gout attack- on steroid dose pack 06/27/2013   Tachycardia- beta blocker increased 06/26/2013   Essential hypertension, benign 03/10/2009   HYPERLIPIDEMIA 09/09/2008   UNSPECIFIED  ANEMIA 09/09/2008   CAROTID STENOSIS- moderate 09/09/2008   UNSPECIFIED CEREBROVASCULAR DISEASE 09/09/2008   RENAL ARTERY STENOSIS- moderate 09/09/2008   PERIPHERAL VASCULAR DISEASE 09/09/2008   PEPTIC ULCER DISEASE, HX OF 09/09/2008    Past Surgical History:  Procedure Laterality Date   AORTIC ARCH ANGIOGRAPHY N/A 08/14/2017   Procedure: AORTIC ARCH ANGIOGRAPHY;  Surgeon: Sherren Mocha, MD;  Location: [facility];  Service: Cardiovascular;  Laterality: N/A;   CARDIAC CATHETERIZATION     CAROTID ENDARTERECTOMY     COLON SURGERY     CORONARY ARTERY BYPASS GRAFT  1994    ENDARTERECTOMY Right 01/05/2013   Procedure: ENDARTERECTOMY CAROTID;  Surgeon: Rosetta Posner, MD;  Location: [facility];  Service: Vascular;  Laterality: Right;   INCISION AND DRAINAGE Left 11/16/2018   Procedure: INCISION AND DRAINAGE LEFT FOREARM/ARM;  Surgeon: Leanora Cover, MD;  Location: [facility];  Service: Orthopedics;  Laterality: Left;   KNEE SURGERY  08/2003   left knee/ARTHROSCOPIC   LEFT HEART CATHETERIZATION WITH CORONARY/GRAFT ANGIOGRAM N/A 09/01/2013   Procedure: LEFT HEART CATHETERIZATION WITH Beatrix Fetters;  Surgeon: Sinclair Grooms, MD;  Location: [facility] CATH LAB;  Service: Cardiovascular;  Laterality: N/A;   PATCH ANGIOPLASTY Right 01/05/2013   Procedure: PATCH ANGIOPLASTY;  Surgeon: Rosetta Posner, MD;  Location: [facility];  Service: Vascular;  Laterality: Right;   PERCUTANEOUS CORONARY STENT INTERVENTION (PCI-S)  09/01/2013   Procedure: PERCUTANEOUS CORONARY STENT INTERVENTION (PCI-S);  Surgeon: Sinclair Grooms, MD;  Location: [facility] CATH LAB;  Service: Cardiovascular;;   removal of bleeding ulcer  1994   RENAL ARTERY STENT     stenting of the left renal artery as well as a cutting balloon angioplasty for treatment of in-stent restenosis coronary artery bypass grafting in 1994.   RIGHT/LEFT HEART CATH AND CORONARY/GRAFT ANGIOGRAPHY N/A 08/14/2017   Procedure: RIGHT/LEFT HEART CATH AND CORONARY/GRAFT ANGIOGRAPHY;  Surgeon: Sherren Mocha, MD;  Location: [facility];  Service: Cardiovascular;  Laterality: N/A;   TEE WITHOUT CARDIOVERSION N/A 09/17/2017   Procedure: TRANSESOPHAGEAL ECHOCARDIOGRAM (TEE);  Surgeon: Sherren Mocha, MD;  Location: [facility];  Service: Open Heart Surgery;  Laterality: N/A;        Home Medications    Prior to Admission medications   Medication Sig Start Date End Date Taking? Authorizing Provider  acetaminophen (TYLENOL) 500 MG tablet Take 1,000 mg by mouth every 6 (six) hours as needed for moderate pain or headache.     [provider]    acyclovir (ZOVIRAX) 800 MG tablet Take 400 mg by mouth 2 (two) times daily.     [provider]  albuterol (PROVENTIL,VENTOLIN) 90 MCG/ACT inhaler Inhale 2 puffs into the lungs every 4 (four) hours as needed for shortness of breath.     [provider]  amoxicillin-clavulanate (AUGMENTIN) 875-125 MG tablet Take 1 tablet by mouth every 12 (twelve) hours. 11/26/18   Matilde Haymaker, MD  apixaban (ELIQUIS) 5 MG TABS tablet Take 2 tablets (10 mg total) by mouth 2 (two) times daily. Take two tablets in the morning and two tablets in the evening through 6/27. Starting on 6/28, in the morning, take one tablet in the morning and one tablet in the evening. 11/26/18   Kinnie Feil, MD  atorvastatin (LIPITOR) 80 MG tablet Take 1 tablet (80 mg total) by mouth at bedtime. 12/07/14   Lelon Perla, MD  benazepril (LOTENSIN) 20 MG tablet Take 10 mg by mouth daily. 09/01/17   [provider]  brimonidine (ALPHAGAN) 0.2 % ophthalmic solution Place 1 drop into both eyes 2 (two) times daily.    [provider]  Cholecalciferol (VITAMIN D) 2000 UNITS CAPS Take 2,000 Units by mouth daily.    [provider]  CLARITIN 10 MG tablet Take 10 mg by mouth daily. 09/05/17   [provider]  ferrous sulfate 325 (65 FE) MG tablet Take 325 mg by mouth daily with breakfast.     [provider]  furosemide (LASIX) 20 MG tablet Take 3 tablets (60 mg total) by mouth daily. 11/26/18   Matilde Haymaker, MD  ipratropium-albuterol (DUONEB) 0.5-2.5 (3) MG/3ML SOLN Take 3 mLs by nebulization 3 (three) times daily.     [provider]  lansoprazole (PREVACID) 30 MG capsule Take 30 mg by mouth daily.      [provider]  metFORMIN (GLUCOPHAGE) 500 MG tablet Take 500 mg by mouth daily with breakfast.    [provider]  metoprolol tartrate (LOPRESSOR) 50 MG tablet Take 2 tablets (100 mg total) by mouth 2 (two) times daily. 11/26/18   Matilde Haymaker, MD  Multiple  Vitamin (MULTIVITAMIN WITH MINERALS) TABS Take 1 tablet by mouth daily.    [provider]  multivitamin-lutein (OCUVITE-LUTEIN) CAPS Take 1 capsule by mouth daily.    [provider]  oxyCODONE (OXY IR/ROXICODONE) 5 MG immediate release tablet Take 1 to 1.5 tablet every 4 hours as needed for pain. 11/26/18   Bonnita Hollow, MD  predniSONE (DELTASONE) 20 MG tablet Take 2 tablets (40 mg total) by mouth daily with breakfast. 11/27/18   Matilde Haymaker, MD  terazosin (HYTRIN) 5 MG capsule Take 5 mg by mouth at bedtime.     [provider]  trolamine salicylate (ASPERCREME) 10 % cream Apply 1 application topically 4 (four) times daily as needed for muscle pain.     [provider]  umeclidinium bromide (INCRUSE ELLIPTA) 62.5 MCG/INH AEPB Inhale 1 puff into the lungs daily. 11/27/18   Matilde Haymaker, MD    Family History Family History  Problem Relation Age of Onset   Coronary artery disease Mother    Heart disease Mother        Before age 66   Hyperlipidemia Mother    Hypertension Mother    Heart attack Mother    Coronary artery disease Father    Heart disease Father        After age 83   Heart attack Father    Heart disease Sister        After age 48   Heart disease Brother        After age 31   Coronary artery disease Other        22 sibling, almost all have coronary disease and some with premature onset   Heart disease Other        13 sibling, almost all have coronary disease and some with premature onset    Social History Social History   Tobacco Use   Smoking status: Former Smoker    Quit date: 04/27/1993    Years since quitting: 25.6   Smokeless tobacco: Never Used  Substance Use Topics   Alcohol use: No    Alcohol/week: 0.0 standard drinks   Drug use: No     Allergies   Bactrim [sulfamethoxazole-trimethoprim] and Levaquin [levofloxacin in d5w]   Review of Systems Review of Systems  Constitutional: Negative for  diaphoresis and fever.  Respiratory: Positive for cough and shortness of breath. Negative  for hemoptysis and wheezing.   Cardiovascular: Positive for leg swelling. Negative for palpitations, syncope and PND.  Gastrointestinal: Negative for abdominal pain and vomiting.  Musculoskeletal: Negative for neck pain.  All other systems reviewed and are negative.    Physical Exam Updated Vital Signs BP (!) 155/83 (BP Location: Right Arm)    Pulse (!) 105    Temp 97.7 F (36.5 C) (Oral)    Resp (!) 31    Ht 6' (1.829 m)    Wt 88 kg    SpO2 100%    BMI 26.31 kg/m   Physical Exam Vitals signs and nursing note reviewed.  Constitutional:      General: He is not in acute distress.    Appearance: He is obese.  HENT:     Head: Normocephalic and atraumatic.     Nose: Nose normal.  Eyes:     Conjunctiva/sclera: Conjunctivae normal.     Pupils: Pupils are equal, round, and reactive to light.  Neck:     Musculoskeletal: Normal range of motion and neck supple.  Cardiovascular:     Rate and Rhythm: Normal rate and regular rhythm.     Pulses: Normal pulses.     Heart sounds: Normal heart sounds.  Pulmonary:     Effort: No respiratory distress or retractions.     Breath sounds: No stridor. Decreased breath sounds present. No wheezing.  Abdominal:     General: Abdomen is flat. Bowel sounds are normal.     Tenderness: There is no abdominal tenderness. There is no guarding or rebound.  Musculoskeletal:     Right lower leg: Edema present.     Comments: L>R  Skin:    General: Skin is warm and dry.     Capillary Refill: Capillary refill takes less than 2 seconds.  Neurological:     General: No focal deficit present.     Mental Status: He is alert and oriented to person, place, and time.     Deep Tendon Reflexes: Reflexes normal.  Psychiatric:        Mood and Affect: Mood normal.        Behavior: Behavior normal.      ED Treatments / Results  Labs (all labs ordered are listed, but only  abnormal results are displayed) Results for orders placed or performed during the hospital encounter of 12/17/18  CBC with Differential/Platelet  Result Value Ref Range   WBC 8.0 4.0 - 10.5 K/uL   RBC 3.61 (L) 4.22 - 5.81 MIL/uL   Hemoglobin 10.4 (L) 13.0 - 17.0 g/dL   HCT 33.1 (L) 39.0 - 52.0 %   MCV 91.7 80.0 - 100.0 fL   MCH 28.8 26.0 - 34.0 pg   MCHC 31.4 30.0 - 36.0 g/dL   RDW 16.3 (H) 11.5 - 15.5 %   Platelets 203 150 - 400 K/uL   nRBC 0.0 0.0 - 0.2 %   Neutrophils Relative % 73 %   Neutro Abs 5.9 1.7 - 7.7 K/uL   Lymphocytes Relative 12 %   Lymphs Abs 0.9 0.7 - 4.0 K/uL   Monocytes Relative 10 %   Monocytes Absolute 0.8 0.1 - 1.0 K/uL   Eosinophils Relative 4 %   Eosinophils Absolute 0.3 0.0 - 0.5 K/uL   Basophils Relative 1 %   Basophils Absolute 0.1 0.0 - 0.1 K/uL   Immature Granulocytes 0 %   Abs Immature Granulocytes 0.02 0.00 - 0.07 K/uL  Brain natriuretic peptide  Result Value Ref Range  B Natriuretic Peptide 310.9 (H) 0.0 - 100.0 pg/mL  I-stat chem 8, ED (not at [facility] or [facility])  Result Value Ref Range   Sodium 136 135 - 145 mmol/L   Potassium 4.3 3.5 - 5.1 mmol/L   Chloride 94 (L) 98 - 111 mmol/L   BUN 7 (L) 8 - 23 mg/dL   Creatinine, Ser 0.60 (L) 0.61 - 1.24 mg/dL   Glucose, Bld 167 (H) 70 - 99 mg/dL   Calcium, Ion 1.25 1.15 - 1.40 mmol/L   TCO2 33 (H) 22 - 32 mmol/L   Hemoglobin 10.9 (L) 13.0 - 17.0 g/dL   HCT 32.0 (L) 39.0 - 52.0 %   Dg Chest 2 View  Result Date: 11/22/2018 CLINICAL DATA:  Hypoxemia EXAM: CHEST - 2 VIEW COMPARISON:  11/20/2018, 08/25/2018 FINDINGS: Post sternotomy changes and valve prosthesis. Small bilateral pleural effusions without significant change. Cardiomegaly with vascular congestion and diffuse interstitial and ground-glass opacity suspect for edema. Bibasilar consolidations. Aortic atherosclerosis. No pneumothorax. IMPRESSION: No significant interval change in cardiomegaly, vascular congestion, pulmonary edema and small bilateral  effusions. Bibasilar atelectasis or pneumonia, also no significant change. Electronically Signed   By: Donavan Foil M.D.   On: 11/22/2018 16:36   Dg Chest 2 View  Result Date: 11/20/2018 CLINICAL DATA:  Shortness of breath and cough EXAM: CHEST - 2 VIEW COMPARISON:  08/25/2018 prior radiographs FINDINGS: Cardiomegaly and CABG/cardiac valve replacement changes again noted. Pulmonary vascular congestion with interstitial edema noted. Small bilateral pleural effusions and bibasilar atelectasis identified. No pneumothorax. IMPRESSION: Cardiomegaly with interstitial pulmonary edema and small bilateral pleural effusions. Electronically Signed   By: Margarette Canada M.D.   On: 11/20/2018 11:06   Ct Angio Chest Pe W Or Wo Contrast  Result Date: 11/23/2018 CLINICAL DATA:  Ongoing shortness of breath. Recent left arm surgery. EXAM: CT ANGIOGRAPHY CHEST WITH CONTRAST TECHNIQUE: Multidetector CT imaging of the chest was performed using the standard protocol during bolus administration of intravenous contrast. Multiplanar CT image reconstructions and MIPs were obtained to evaluate the vascular anatomy. CONTRAST:  77mL OMNIPAQUE IOHEXOL 350 MG/ML SOLN COMPARISON:  08/22/2017 FINDINGS: Cardiovascular: Moderate stable cardiomegaly. Calcification of the mitral valve annulus. Interval TAVR. Calcification over the left main and 3 vessel coronary arteries. Calcified plaque over the thoracic aorta. Pulmonary arterial system is well opacified. There is subtle peripheral linear filling defect over a distal right lower lobar pulmonary artery which may be sequelae from chronic thrombus although small acute thrombus is possible. Mediastinum/Nodes: No significant mediastinal or hilar adenopathy. Few shoddy mediastinal lymph nodes are present. Remaining mediastinal structures are unremarkable. Lungs/Pleura: Lungs are adequately inflated demonstrate moderate size bilateral pleural effusions with associated compressive atelectasis in the  lung bases. Linear atelectasis over the lingula. Airways are unremarkable. Upper Abdomen: Calcified plaque over the abdominal aorta. Tiny amount of perihepatic fluid is present. Musculoskeletal: Degenerative change of the spine. Stable mild T12 compression fracture. Review of the MIP images confirms the above findings. IMPRESSION: Tiny peripheral linear filling defect over a distal right lower lobar pulmonary artery which may be due to acute thrombus versus sequelae from chronic thrombus. Moderate size bilateral pleural effusions with associated bibasilar compressive atelectasis. Linear atelectasis over the lingula. Cardiomegaly. Atherosclerotic coronary artery disease. Evidence of TAVR. Aortic Atherosclerosis (ICD10-I70.0). Stable T12 compression fracture. These results will be called to the ordering clinician or representative by the Radiologist Assistant, and communication documented in the PACS or zVision Dashboard. Electronically Signed   By: Marin Olp M.D.   On: 11/23/2018 13:01  Dg Chest Portable 1 View  Result Date: 12/17/2018 CLINICAL DATA:  Shortness of breath for 3 days EXAM: PORTABLE CHEST 1 VIEW COMPARISON:  11/22/2018 FINDINGS: Chronic moderate pleural effusions obscuring the lower lungs. Chronic cardiomegaly status post transcatheter aortic valve replacement. No Kerley lines or air bronchogram. No pneumothorax IMPRESSION: Cardiomegaly and moderate pleural effusions also seen on imaging in June 2020. Electronically Signed   By: Monte Fantasia M.D.   On: 12/17/2018 06:31    EKG EKG Interpretation  Date/Time:  Wednesday December 17 2018 05:57:14 EDT Ventricular Rate:  101 PR Interval:    QRS Duration: 167 QT Interval:  383 QTC Calculation: 497 R Axis:   -68 Text Interpretation:  Atrial fibrillation Nonspecific IVCD with LAD LVH with secondary repolarization abnormality LBBB Confirmed by Randal Buba, Starr Engel ([id number]) on 12/17/2018 6:17:50 AM   Radiology Dg Chest Portable 1 View  Result  Date: 12/17/2018 CLINICAL DATA:  Shortness of breath for 3 days EXAM: PORTABLE CHEST 1 VIEW COMPARISON:  11/22/2018 FINDINGS: Chronic moderate pleural effusions obscuring the lower lungs. Chronic cardiomegaly status post transcatheter aortic valve replacement. No Kerley lines or air bronchogram. No pneumothorax IMPRESSION: Cardiomegaly and moderate pleural effusions also seen on imaging in June 2020. Electronically Signed   By: Monte Fantasia M.D.   On: 12/17/2018 06:31    Procedures Procedures (including critical care time)  Medications Ordered in ED Medications  albuterol (VENTOLIN HFA) 108 (90 Base) MCG/ACT inhaler 8 puff (has no administration in time range)  furosemide (LASIX) injection 40 mg (has no administration in time range)       Final Clinical Impressions(s) / ED Diagnoses   Signed out to Dr. Laverta Baltimore,  CT and labs pending.  Will likely need admission    Ryenn Howeth, MD 12/17/18 [phone]

## 2018-12-17 NOTE — Plan of Care (Signed)
Patient taken off Bipap and placed on O2 at 4L N/C, will continue to monitor, will check swallowing and give po meds if able, reviewed pursed lip breathing, did mouthcare and gave patient a Doris Cheadle and showed him how to use it, will continue to monitor. Patient unable to void on his own, will bladder scan as needed and In/Out as per protocol and place a foley if needed and per protocol.

## 2018-12-17 NOTE — Progress Notes (Signed)
Attempted to give the pt a break from the bipap. Placed 3 L [place] on pt and he got SOB without BIPAP. Pt is comfortably resting again on the BIPAP. Will attempt again at a later time.

## 2018-12-17 NOTE — ED Provider Notes (Signed)
Blood pressure 126/74, pulse (!) 103, temperature 97.7 F (36.5 C), temperature source Oral, resp. rate (!) 27, height 6' (1.829 m), weight 88 kg, SpO2 100 %.  Assuming care from Dr. Randal Buba.  In short, [name] is a 85 y.o. male with a chief complaint of Shortness of Breath .  Refer to the original H&P for additional details.  The current plan of care is to follow up on labs and CTA.   10:20 AM  CTA without PE.  He has moderate pleural effusions bilaterally.  I suspect that this is the cause of the shortness of breath.  Patient continued to have increased work of breathing, reassessment.  He has had 1 L of output after Lasix.   Patient speaking in complete sentences but does have continued tachypnea and increased work of breathing.  As I suspect that this is related to fluid I have started the patient on BiPAP.  We will follow-up ABG.  Plan for family medicine admission.   Discussed patient's case with Family Medicine to request admission. Patient and family (if present) updated with plan. Care transferred to [facility] Medicine service.  I reviewed all nursing notes, vitals, pertinent old records, EKGs, labs, imaging (as available).  CRITICAL CARE Performed by: Margette Fast Total critical care time: 35 minutes Critical care time was exclusive of separately billable procedures and treating other patients. Critical care was necessary to treat or prevent imminent or life-threatening deterioration. Critical care was time spent personally by me on the following activities: development of treatment plan with patient and/or surrogate as well as nursing, discussions with consultants, evaluation of patient's response to treatment, examination of patient, obtaining history from patient or surrogate, ordering and performing treatments and interventions, ordering and review of laboratory studies, ordering and review of radiographic studies, pulse oximetry and re-evaluation of patient's  condition.  Nanda Quinton, MD Emergency Medicine    Long, [facility], MD 12/17/18 1040

## 2018-12-17 NOTE — Progress Notes (Signed)
Patient unable to void. Has ~450cc urinary retention per bladder scan and is uncomfortable. Requested nurse utilize an in-out cath at this time.  Patient is on terazosin as home medication which was continued as inpatient.  Per note review- patient has tried 0.4mg  flomax in 2014 post-op when having acute retention and it was successful at that time. Unsure why he was discontinued. There is no allergy to this medication listed.

## 2018-12-17 NOTE — ED Notes (Signed)
Updated pt wife per patient request.

## 2018-12-17 NOTE — ED Triage Notes (Signed)
Pt arrives via EMS from home with c/o SOB for 3 days, dry cough, 98 % on 3 liters (chronic). No fever. 150/84, hr 96, 22 RR, unable to establish IV

## 2018-12-17 NOTE — Progress Notes (Signed)
Pt was placed on BIPAP per MD order due to SOB. Pt is tolerating well at this time. ABG to follow.

## 2018-12-17 NOTE — Progress Notes (Signed)
Transferred pt on BIPAP to 0X65 without complications.

## 2018-12-17 NOTE — ED Notes (Signed)
ED TO INPATIENT HANDOFF REPORT  ED Nurse Name and Phone #:  Donella Stade [phone]  S Name/Age/Gender [name] 85 y.o. male Room/Bed: 033C/033C  Code Status   Code Status: Full Code  Home/SNF/Other Home Patient oriented to: self, place, time and situation Is this baseline? Yes   Triage Complete: Triage complete  Chief Complaint SOB  Triage Note Pt arrives via EMS from home with c/o SOB for 3 days, dry cough, 98 % on 3 liters (chronic). No fever. 150/84, hr 96, 22 RR, unable to establish IV   Allergies Allergies  Allergen Reactions  . Bactrim [Sulfamethoxazole-Trimethoprim] Other (See Comments)    "does not do anything for me"  . Levaquin [Levofloxacin In D5w] Diarrhea    Level of Care/Admitting Diagnosis ED Disposition    ED Disposition Condition [facility]: [place] [[id number]]  Level of Care: Progressive [102]  Covid Evaluation: Asymptomatic Screening Protocol (No Symptoms)  Diagnosis: CHF (congestive heart failure) [facility]) [[id number]]  Admitting Physician: Matilde Haymaker [[id number]]  Attending Physician: Leeanne Rio [phone]  Estimated length of stay: 3 - 4 days  Certification:: I certify this patient will need inpatient services for at least 2 midnights  PT Class (Do Not Modify): Inpatient [101]  PT Acc Code (Do Not Modify): Private [1]       B Medical/Surgery History Past Medical History:  Diagnosis Date  . Acute myocardial infarction, unspecified site, initial episode of care   . Anemia   . Anxiety   . Aortic stenosis    . Atrial fibrillation ([place])    a. permanent, on Coumadin for anticoagulation  . CAD (coronary artery disease)    a. s/p CABG in 1994 b. cath in 2015 showing normal LM, 100% LCx and RCA stenosis with 50-60% prox LAD stenosis and 100% mid-LAD stenosis; patent sequential SVG-OM2-OM3 and patent LIMA-LAD with PTCA of distal LAD via LIMA graft performed at that time  . Carotid stenosis   . CHF  (congestive heart failure) ([place])   . COPD (chronic obstructive pulmonary disease) ([place])   . Depression   . Diabetes mellitus without complication ([place])    FASTING 98-120S  . GERD (gastroesophageal reflux disease)   . History of blood transfusion   . History of peptic ulcer disease   . Hyperlipidemia   . Hypertension   . Myocardial infarction ([place])    X2  . Peripheral vascular disease ([place])   . Pneumonia    HX OF  . Renal artery stenosis ([place])   . Sleep apnea    OXYGEN AT NIGHT 2L [place]   Past Surgical History:  Procedure Laterality Date  . AORTIC ARCH ANGIOGRAPHY N/A 08/14/2017   Procedure: AORTIC ARCH ANGIOGRAPHY;  Surgeon: Sherren Mocha, MD;  Location: [place] [facility];  Service: Cardiovascular;  Laterality: N/A;  . CARDIAC CATHETERIZATION    . CAROTID ENDARTERECTOMY    . COLON SURGERY    . CORONARY ARTERY BYPASS GRAFT  1994  . ENDARTERECTOMY Right 01/05/2013   Procedure: ENDARTERECTOMY CAROTID;  Surgeon: Rosetta Posner, MD;  Location: [place];  Service: Vascular;  Laterality: Right;  . INCISION AND DRAINAGE Left 11/16/2018   Procedure: INCISION AND DRAINAGE LEFT FOREARM/ARM;  Surgeon: Leanora Cover, MD;  Location: [place];  Service: Orthopedics;  Laterality: Left;  . KNEE SURGERY  08/2003   left knee/ARTHROSCOPIC  . LEFT HEART CATHETERIZATION WITH CORONARY/GRAFT ANGIOGRAM N/A 09/01/2013   Procedure: LEFT HEART CATHETERIZATION WITH Beatrix Fetters;  Surgeon: Mallie Mussel  Carlye Grippe, MD;  Location: [facility] CATH LAB;  Service: Cardiovascular;  Laterality: N/A;  . PATCH ANGIOPLASTY Right 01/05/2013   Procedure: PATCH ANGIOPLASTY;  Surgeon: Rosetta Posner, MD;  Location: [place];  Service: Vascular;  Laterality: Right;  . PERCUTANEOUS CORONARY STENT INTERVENTION (PCI-S)  09/01/2013   Procedure: PERCUTANEOUS CORONARY STENT INTERVENTION (PCI-S);  Surgeon: Sinclair Grooms, MD;  Location: [facility] CATH LAB;  Service: Cardiovascular;;  . removal of bleeding ulcer  1994  . RENAL ARTERY STENT     stenting of  the left renal artery as well as a cutting balloon angioplasty for treatment of in-stent restenosis coronary artery bypass grafting in 1994.  [name] RIGHT/LEFT HEART CATH AND CORONARY/GRAFT ANGIOGRAPHY N/A 08/14/2017   Procedure: RIGHT/LEFT HEART CATH AND CORONARY/GRAFT ANGIOGRAPHY;  Surgeon: Sherren Mocha, MD;  Location: [place] [facility];  Service: Cardiovascular;  Laterality: N/A;  . TEE WITHOUT CARDIOVERSION N/A 09/17/2017   Procedure: TRANSESOPHAGEAL ECHOCARDIOGRAM (TEE);  Surgeon: Sherren Mocha, MD;  Location: [place];  Service: Open Heart Surgery;  Laterality: N/A;     A IV Location/Drains/Wounds Patient Lines/Drains/Airways Status   Active Line/Drains/Airways    Name:   Placement date:   Placement time:   Site:   Days:   Peripheral IV 12/17/18 Right Antecubital   12/17/18    0604    Antecubital   less than 1   Incision (Closed) 11/16/18 Arm Left   11/16/18    1850     31   Wound / Incision (Open or Dehisced) 11/19/18 Other (Comment) Arm Left left dorsal cellulitic wound post I and D   11/19/18    -    Arm   28   Wound / Incision (Open or Dehisced) 11/19/18 Other (Comment) Arm Left ventral forearm wound post I and D   11/19/18    -    Arm   28          Intake/Output Last 24 hours  Intake/Output Summary (Last 24 hours) at 12/17/2018 1336 Last data filed at 12/17/2018 1016 Gross per 24 hour  Intake -  Output 1100 ml  Net -1100 ml    Labs/Imaging Results for orders placed or performed during the hospital encounter of 12/17/18 (from the past 48 hour(s))  CBC with Differential/Platelet     Status: Abnormal   Collection Time: 12/17/18  5:57 AM  Result Value Ref Range   WBC 8.0 4.0 - 10.5 K/uL   RBC 3.61 (L) 4.22 - 5.81 MIL/uL   Hemoglobin 10.4 (L) 13.0 - 17.0 g/dL   HCT 33.1 (L) 39.0 - 52.0 %   MCV 91.7 80.0 - 100.0 fL   MCH 28.8 26.0 - 34.0 pg   MCHC 31.4 30.0 - 36.0 g/dL   RDW 16.3 (H) 11.5 - 15.5 %   Platelets 203 150 - 400 K/uL   nRBC 0.0 0.0 - 0.2 %   Neutrophils  Relative % 73 %   Neutro Abs 5.9 1.7 - 7.7 K/uL   Lymphocytes Relative 12 %   Lymphs Abs 0.9 0.7 - 4.0 K/uL   Monocytes Relative 10 %   Monocytes Absolute 0.8 0.1 - 1.0 K/uL   Eosinophils Relative 4 %   Eosinophils Absolute 0.3 0.0 - 0.5 K/uL   Basophils Relative 1 %   Basophils Absolute 0.1 0.0 - 0.1 K/uL   Immature Granulocytes 0 %   Abs Immature Granulocytes 0.02 0.00 - 0.07 K/uL    Comment: Performed at [facility], [address] [place], [place] [address]  Brain natriuretic peptide     Status: Abnormal   Collection Time: 12/17/18  5:57 AM  Result Value Ref Range   B Natriuretic Peptide 310.9 (H) 0.0 - 100.0 pg/mL    Comment: Performed at [facility] [address], [place], [place] [address]  Troponin I (High Sensitivity)     Status: None   Collection Time: 12/17/18  5:57 AM  Result Value Ref Range   Troponin I (High Sensitivity) 12 <18 ng/L    Comment: (NOTE) Elevated high sensitivity troponin I (hsTnI) values and significant  changes across serial measurements may suggest ACS but many other  chronic and acute conditions are known to elevate hsTnI results.  Refer to the "Links" section for chest pain algorithms and additional  guidance. Performed at [facility], [place] [address] [place], [place] [address]   Hepatic function panel     Status: Abnormal   Collection Time: 12/17/18  5:57 AM  Result Value Ref Range   Total Protein 5.6 (L) 6.5 - 8.1 g/dL   Albumin 3.0 (L) 3.5 - 5.0 g/dL   AST 22 15 - 41 U/L   ALT 25 0 - 44 U/L   Alkaline Phosphatase 138 (H) 38 - 126 U/L   Total Bilirubin 0.7 0.3 - 1.2 mg/dL   Bilirubin, Direct 0.3 (H) 0.0 - 0.2 mg/dL   Indirect Bilirubin 0.4 0.3 - 0.9 mg/dL    Comment: Performed at [facility] [address], [place], [place] [address]  SARS Coronavirus 2 (CEPHEID- Performed in [place] hospital lab), Hosp Order     Status: None   Collection Time: 12/17/18  6:08 AM   Specimen: Nasopharyngeal Swab  Result  Value Ref Range   SARS Coronavirus 2 NEGATIVE NEGATIVE    Comment: (NOTE) If result is NEGATIVE SARS-CoV-2 target nucleic acids are NOT DETECTED. The SARS-CoV-2 RNA is generally detectable in upper and lower  respiratory specimens during the acute phase of infection. The lowest  concentration of SARS-CoV-2 viral copies this assay can detect is 250  copies / mL. A negative result does not preclude SARS-CoV-2 infection  and should not be used as the sole basis for treatment or other  patient management decisions.  A negative result may occur with  improper specimen collection / handling, submission of specimen other  than nasopharyngeal swab, presence of viral mutation(s) within the  areas targeted by this assay, and inadequate number of viral copies  (<250 copies / mL). A negative result must be combined with clinical  observations, patient history, and epidemiological information. If result is POSITIVE SARS-CoV-2 target nucleic acids are DETECTED. The SARS-CoV-2 RNA is generally detectable in upper and lower  respiratory specimens dur ing the acute phase of infection.  Positive  results are indicative of active infection with SARS-CoV-2.  Clinical  correlation with patient history and other diagnostic information is  necessary to determine patient infection status.  Positive results do  not rule out bacterial infection or co-infection with other viruses. If result is PRESUMPTIVE POSTIVE SARS-CoV-2 nucleic acids MAY BE PRESENT.   A presumptive positive result was obtained on the submitted specimen  and confirmed on repeat testing.  While 2019 novel coronavirus  (SARS-CoV-2) nucleic acids may be present in the submitted sample  additional confirmatory testing may be necessary for epidemiological  and / or clinical management purposes  to differentiate between  SARS-CoV-2 and other Sarbecovirus currently known to infect humans.  If clinically indicated additional testing with an  alternate test  methodology [phone]) is advised. The SARS-CoV-2 RNA is generally  detectable in upper and lower respiratory sp ecimens during the acute  phase of infection. The expected result is Negative. Fact Sheet for Patients:  StrictlyIdeas.no Fact Sheet for Healthcare Providers: BankingDealers.co.za This test is not yet approved or cleared by the [place] FDA and has been authorized for detection and/or diagnosis of SARS-CoV-2 by FDA under an Emergency Use Authorization (EUA).  This EUA will remain in effect (meaning this test can be used) for the duration of the COVID-19 declaration under Section 564(b)(1) of the Act, 21 U.S.C. section 360bbb-3(b)(1), unless the authorization is terminated or revoked sooner. Performed at [facility], [place] [address], [place], [place] [address]   I-stat chem 8, ED (not at [facility] or [facility])     Status: Abnormal   Collection Time: 12/17/18  6:13 AM  Result Value Ref Range   Sodium 136 135 - 145 mmol/L   Potassium 4.3 3.5 - 5.1 mmol/L   Chloride 94 (L) 98 - 111 mmol/L   BUN 7 (L) 8 - 23 mg/dL   Creatinine, Ser 0.60 (L) 0.61 - 1.24 mg/dL   Glucose, Bld 167 (H) 70 - 99 mg/dL   Calcium, Ion 1.25 1.15 - 1.40 mmol/L   TCO2 33 (H) 22 - 32 mmol/L   Hemoglobin 10.9 (L) 13.0 - 17.0 g/dL   HCT 32.0 (L) 39.0 - 52.0 %  Troponin I (High Sensitivity)     Status: None   Collection Time: 12/17/18  8:46 AM  Result Value Ref Range   Troponin I (High Sensitivity) 15 <18 ng/L    Comment: (NOTE) Elevated high sensitivity troponin I (hsTnI) values and significant  changes across serial measurements may suggest ACS but many other  chronic and acute conditions are known to elevate hsTnI results.  Refer to the "Links" section for chest pain algorithms and additional  guidance. Performed at [facility], [place] [address] [place], [place] [address]   I-STAT 7, (LYTES, BLD GAS, ICA, H+H)     Status:  Abnormal   Collection Time: 12/17/18 11:28 AM  Result Value Ref Range   pH, Arterial 7.428 7.350 - 7.450   pCO2 arterial 48.3 (H) 32.0 - 48.0 mmHg   pO2, Arterial 68.0 (L) 83.0 - 108.0 mmHg   Bicarbonate 31.9 (H) 20.0 - 28.0 mmol/L   TCO2 33 (H) 22 - 32 mmol/L   O2 Saturation 93.0 %   Acid-Base Excess 7.0 (H) 0.0 - 2.0 mmol/L   Sodium 137 135 - 145 mmol/L   Potassium 3.5 3.5 - 5.1 mmol/L   Calcium, Ion 1.19 1.15 - 1.40 mmol/L   HCT 27.0 (L) 39.0 - 52.0 %   Hemoglobin 9.2 (L) 13.0 - 17.0 g/dL   Patient temperature HIDE    Collection site RADIAL, ALLEN'S TEST ACCEPTABLE    Drawn by RT    Sample type ARTERIAL    Ct Angio Chest Pe W And/or Wo Contrast  Result Date: 12/17/2018 CLINICAL DATA:  Shortness of breath. EXAM: CT ANGIOGRAPHY CHEST WITH CONTRAST TECHNIQUE: Multidetector CT imaging of the chest was performed using the standard protocol during bolus administration of intravenous contrast. Multiplanar CT image reconstructions and MIPs were obtained to evaluate the vascular anatomy. CONTRAST:  181mL OMNIPAQUE IOHEXOL 350 MG/ML SOLN COMPARISON:  CT scan of November 23, 2018. FINDINGS: Cardiovascular: Satisfactory opacification of the pulmonary arteries to the segmental level. No evidence of  pulmonary embolism. Mild cardiomegaly is noted. Status post transcatheter aortic valve repair. Coronary artery calcifications are noted. Status post coronary bypass graft. Atherosclerosis of thoracic aorta is noted without aneurysm formation. No pericardial effusion. Mediastinum/Nodes: No enlarged mediastinal, hilar, or axillary lymph nodes. Thyroid gland, trachea, and esophagus demonstrate no significant findings. Lungs/Pleura: No pneumothorax is noted. Moderate size bilateral pleural effusions are noted with adjacent subsegmental atelectasis of both lower lobes. Stable mild left upper lobe subsegmental atelectasis or scarring is noted. Upper Abdomen: No acute abnormality. Musculoskeletal: No chest wall  abnormality. No acute or significant osseous findings. Review of the MIP images confirms the above findings. IMPRESSION: No definite evidence of pulmonary embolus. Moderate bilateral pleural effusions are noted with adjacent subsegmental atelectasis. Status post coronary bypass grafting and transcatheter aortic valve replacement. Aortic Atherosclerosis (ICD10-I70.0).1 Electronically Signed   By: Marijo Conception M.D.   On: 12/17/2018 09:49   Dg Chest Portable 1 View  Result Date: 12/17/2018 CLINICAL DATA:  Shortness of breath for 3 days EXAM: PORTABLE CHEST 1 VIEW COMPARISON:  11/22/2018 FINDINGS: Chronic moderate pleural effusions obscuring the lower lungs. Chronic cardiomegaly status post transcatheter aortic valve replacement. No Kerley lines or air bronchogram. No pneumothorax IMPRESSION: Cardiomegaly and moderate pleural effusions also seen on imaging in June 2020. Electronically Signed   By: Monte Fantasia M.D.   On: 12/17/2018 06:31    Pending Labs Unresulted Labs (From admission, onward)    Start     Ordered   12/18/18 3557  Basic metabolic panel  Daily,   R     12/17/18 1130   12/18/18 0500  CBC  Daily,   R     12/17/18 1130   12/17/18 1228  Hemoglobin A1c  Once,   R     12/17/18 1228   12/17/18 1110  Blood gas, arterial  Once,   R     12/17/18 1039          Vitals/Pain Today's Vitals   12/17/18 1200 12/17/18 1230 12/17/18 1321 12/17/18 1329  BP: 117/63 137/74 (!) 141/61   Pulse: 96 96 99   Resp: (!) 35 (!) 31    Temp:      TempSrc:      SpO2: 100% 100% 100%   Weight:      Height:      PainSc:    0-No pain    Isolation Precautions No active isolations  Medications Medications  atorvastatin (LIPITOR) tablet 80 mg (has no administration in time range)  terazosin (HYTRIN) capsule 5 mg (has no administration in time range)  pantoprazole (PROTONIX) EC tablet 40 mg (has no administration in time range)  apixaban (ELIQUIS) tablet 5 mg (has no administration in time  range)  ferrous sulfate tablet 325 mg (has no administration in time range)  methocarbamol (ROBAXIN) tablet 750 mg (has no administration in time range)  multivitamin with minerals tablet 1 tablet (has no administration in time range)  albuterol (PROVENTIL,VENTOLIN) inhaler 2 puff (has no administration in time range)  fluticasone furoate-vilanterol (BREO ELLIPTA) 100-25 MCG/INH 1 puff (1 puff Inhalation Not Given 12/17/18 1132)  umeclidinium bromide (INCRUSE ELLIPTA) 62.5 MCG/INH 1 puff (1 puff Inhalation Not Given 12/17/18 1133)  brimonidine (ALPHAGAN) 0.2 % ophthalmic solution 1 drop (has no administration in time range)  sodium chloride flush (NS) 0.9 % injection 3 mL (has no administration in time range)  sodium chloride flush (NS) 0.9 % injection 3 mL (has no administration in time range)  0.9 %  sodium  chloride infusion (has no administration in time range)  acetaminophen (TYLENOL) tablet 650 mg (has no administration in time range)  furosemide (LASIX) injection 40 mg (has no administration in time range)  insulin aspart (novoLOG) injection 0-9 Units (has no administration in time range)  albuterol (VENTOLIN HFA) 108 (90 Base) MCG/ACT inhaler 8 puff (8 puffs Inhalation Given 12/17/18 0724)  furosemide (LASIX) injection 40 mg (40 mg Intravenous Given 12/17/18 0724)  iohexol (OMNIPAQUE) 350 MG/ML injection 100 mL (100 mLs Intravenous Contrast Given 12/17/18 0919)    Mobility walks Low fall risk   Focused Assessments Pulmonary Assessment Handoff:  Lung sounds: Bilateral Breath Sounds: Clear L Breath Sounds: Diminished R Breath Sounds: Diminished, Clear O2 Device: Bi-PAP O2 Flow Rate (L/min): 3 L/min      R Recommendations: See Admitting Provider Note  Report given to:   Additional Notes:

## 2018-12-18 DIAGNOSIS — R0602 Shortness of breath: Secondary | ICD-10-CM

## 2018-12-18 LAB — CBC
HCT: 28 % — ABNORMAL LOW (ref 39.0–52.0)
Hemoglobin: 9 g/dL — ABNORMAL LOW (ref 13.0–17.0)
MCH: 29.3 pg (ref 26.0–34.0)
MCHC: 32.1 g/dL (ref 30.0–36.0)
MCV: 91.2 fL (ref 80.0–100.0)
Platelets: 209 10*3/uL (ref 150–400)
RBC: 3.07 MIL/uL — ABNORMAL LOW (ref 4.22–5.81)
RDW: 16.3 % — ABNORMAL HIGH (ref 11.5–15.5)
WBC: 5.7 10*3/uL (ref 4.0–10.5)
nRBC: 0 % (ref 0.0–0.2)

## 2018-12-18 LAB — BASIC METABOLIC PANEL
Anion gap: 10 (ref 5–15)
BUN: 5 mg/dL — ABNORMAL LOW (ref 8–23)
CO2: 30 mmol/L (ref 22–32)
Calcium: 8.8 mg/dL — ABNORMAL LOW (ref 8.9–10.3)
Chloride: 99 mmol/L (ref 98–111)
Creatinine, Ser: 0.72 mg/dL (ref 0.61–1.24)
GFR calc Af Amer: >60 mL/min (ref >60)
GFR calc non Af Amer: >60 mL/min (ref >60)
Glucose, Bld: 104 mg/dL — ABNORMAL HIGH (ref 70–99)
Potassium: 3.4 mmol/L — ABNORMAL LOW (ref 3.5–5.1)
Sodium: 139 mmol/L (ref 135–145)

## 2018-12-18 LAB — GLUCOSE, CAPILLARY
Glucose-Capillary: 137 mg/dL — ABNORMAL HIGH (ref 70–99)
Glucose-Capillary: 154 mg/dL — ABNORMAL HIGH (ref 70–99)
Glucose-Capillary: 198 mg/dL — ABNORMAL HIGH (ref 70–99)

## 2018-12-18 MED ORDER — CARVEDILOL 3.125 MG PO TABS
3.1250 mg | ORAL_TABLET | Freq: Two times a day (BID) | ORAL | Status: DC
  Administered 2018-12-18 – 2018-12-22 (×9): 3.125 mg via ORAL
  Filled 2018-12-18 (×9): qty 1

## 2018-12-18 MED ORDER — RAMELTEON 8 MG PO TABS
8.0000 mg | ORAL_TABLET | Freq: Once | ORAL | Status: DC
  Filled 2018-12-18: qty 1

## 2018-12-18 MED ORDER — POTASSIUM CHLORIDE CRYS ER 20 MEQ PO TBCR
40.0000 meq | EXTENDED_RELEASE_TABLET | Freq: Once | ORAL | Status: AC
  Administered 2018-12-18: 14:00:00 40 meq via ORAL
  Filled 2018-12-18: qty 2

## 2018-12-18 MED ORDER — ACETAMINOPHEN-CODEINE #3 300-30 MG PO TABS
1.0000 | ORAL_TABLET | Freq: Three times a day (TID) | ORAL | Status: DC | PRN
  Administered 2018-12-18 (×2): 1 via ORAL
  Filled 2018-12-18 (×3): qty 1

## 2018-12-18 NOTE — Progress Notes (Signed)
Occupational Therapy Evaluation Patient Details Name: [name] MRN: [id number] DOB: February 01, 1932 Today's Date: 12/18/2018    History of Present Illness Pt is a 85 y/o male admitted with decompensated heart failure with acute hypoxic respiratory failure with preserved ejection fraction (EF 50-55% 11/24/2018). PMH is significant for HFpEF, CAD s/p CABG (1994), s/p TAVR, A-fib, COPD on home O2, recent PE (11/2018), DM, depression, anxiety, HTN, HLD, hospitalization 6/20 for cellulitis of left forearm.   Clinical Impression   Pt admitted with above diagnosis. PTA, pt independent with functional mobility using RW. Pt reports his wife has been helping with donning/doffing socks and shoes but he is able to complete remainder of BADLs independently. Pt does report increasing fatigue with activity at home, completing most of ADLs seated and with multiple rest breaks. Pt presents with generalized weakness and decreased activity tolerance.  HR fluctuating between 90s and 130 during limited activity. Pt would benefit from SNF for discharge planning.  Will continue to follow acutely in order to maximize safety and independence with ADLs.    Follow Up Recommendations  SNF;Supervision/Assistance - 24 hour    Equipment Recommendations  None recommended by OT    Recommendations for Other Services       Precautions / Restrictions Precautions Precautions: Fall Precaution Comments: left UE wrapped      Mobility Bed Mobility Overal bed mobility: Needs Assistance Bed Mobility: Supine to Sit;Sit to Supine     Supine to sit: Supervision;HOB elevated Sit to supine: Supervision;HOB elevated   General bed mobility comments: increased time with use of railing  Transfers Overall transfer level: Needs assistance Equipment used: Rolling walker (2 wheeled) Transfers: Sit to/from Stand Sit to Stand: Min guard;From elevated surface        General transfer comment: VCs for hand placement    Balance                          ADL either performed or assessed with clinical judgement   ADL Overall ADL's : Needs assistance/impaired Eating/Feeding: Modified independent;Sitting   Grooming: Supervision/safety;Sitting   Upper Body Bathing: Supervision/ safety;Sitting   Lower Body Bathing: Minimal assistance;Sit to/from stand   Upper Body Dressing : Supervision/safety;Sitting   Lower Body Dressing: Minimal assistance;Sit to/from stand                 General ADL Comments: Pt declining OOB transfer but willing to perform sit<>stand.      Vision Patient Visual Report: No change from baseline       Perception     Praxis      Pertinent Vitals/Pain Pain Assessment: Faces Pain Score: 3  Faces Pain Scale: Hurts a little bit Pain Location: low back pain Pain Descriptors / Indicators: Aching Pain Intervention(s): Monitored during session;Repositioned;Limited activity within patient's tolerance     Hand Dominance Right   Extremity/Trunk Assessment Upper Extremity Assessment Upper Extremity Assessment: LUE deficits/detail;Generalized weakness LUE Deficits / Details: left UE wrapped (s/p I&D and hydro in June)   Lower Extremity Assessment Lower Extremity Assessment: Defer to PT evaluation       Communication Communication Communication: HOH   Cognition Arousal/Alertness: Awake/alert Behavior During Therapy: Flat affect Overall Cognitive Status: Within Functional Limits for tasks assessed                                     General Comments  HR 90s-130s  with limited activity    Exercises   Shoulder Instructions      Home Living Family/patient expects to be discharged to:: Private residence Living Arrangements: Spouse/significant other Available Help at Discharge: Family;Available 24 hours/day Type of Home: House Home Access: Stairs to enter [employer] of Steps: 6 Entrance Stairs-Rails: Right;Left Home Layout: One  level     Bathroom Shower/Tub: Occupational psychologist: Standard     Home Equipment: Cane - single point;Walker - 2 wheels;Shower seat;Bedside commode;Crutches          Prior Functioning/Environment Level of Independence: Needs assistance    ADL's / Homemaking Assistance Needed: Wife assists with socks/shoes at home.   Comments: uses RW or cane for gait in the house max of 300'        OT Problem List: Decreased strength;Decreased activity tolerance;Impaired balance (sitting and/or standing);Decreased knowledge of use of DME or AE;Pain      OT Treatment/Interventions: Self-care/ADL training;Therapeutic exercise;Energy conservation;DME and/or AE instruction;Therapeutic activities;Patient/family education;Balance training    OT Goals(Current goals can be found in the care plan section) Acute Rehab OT Goals Patient Stated Goal: be able to function again OT Goal Formulation: With patient Time For Goal Achievement: 01/01/19 Potential to Achieve Goals: Good  OT Frequency: Min 2X/week   Barriers to D/C:            Co-evaluation              AM-PAC OT "6 Clicks" Daily Activity     Outcome Measure Help from another person eating meals?: None Help from another person taking care of personal grooming?: None(sitting) Help from another person toileting, which includes using toliet, bedpan, or urinal?: A Little Help from another person bathing (including washing, rinsing, drying)?: A Little Help from another person to put on and taking off regular upper body clothing?: None Help from another person to put on and taking off regular lower body clothing?: A Little 6 Click Score: 21   End of Session Equipment Utilized During Treatment: Gait belt;Rolling walker;Oxygen Nurse Communication: Mobility status  Activity Tolerance: Patient limited by fatigue Patient left: in bed;with call bell/phone within reach;with bed alarm set  OT Visit Diagnosis: Unsteadiness on feet  (R26.81);Muscle weakness (generalized) (M62.81);Pain Pain - part of body: (low back)                Time: 1311-1331 OT Time Calculation (min): 20 min Charges:  OT General Charges $OT Visit: 1 Visit OT Evaluation $OT Eval Moderate Complexity: 1 Mod    Darrol Jump OTR/L [place] [phone] 12/18/2018, 2:56 PM

## 2018-12-18 NOTE — Consult Note (Signed)
   [name] CM Inpatient Consult   12/18/2018  [name] Apr 25, 1932 [id number]    Patientreviewedfor potential need of [facility] care management services for a 28% high risk score of unplanned readmissions, with 3hospitalizationsand 1 ED visit in the past 6 months,underhis Medicare/ NextGenplan. He was outreached by [name] coordinators for EMMI follow-up callsin the past.  Chart review and MD history & physical on 715/20 reveal as follows: [name] is a 85 y.o. male presenting with decompensated heart failure. PMH is significant for HFpEF, CAD s/p CABG (1994), s/p TAVR, A-fib, COPD, recent PE (11/2018), hospitalization 6/20 for cellulitis of left forearm, iron deficiency anemia. who presented to the ED with progressive shortness of breath which is been persistent since his hospital discharge on 6/22.  He feels significant fatigue and is now forced to sleep in an arm chair as opposed to his bed.  His physical exam is notable for respiratory distress, lower extremity edema, decreased breath sounds in lower lung fields with rales in the middle fields bilaterally.  (Acute hypoxic respiratory failure secondary to acute on chronic heart failure with preserved ejection fraction [EF 50-55% 11/24/2018]. Initially on BIPAP and weaned off, now on Oxygen at 3 Liters (home dose).   Patient'sprimary care provider-Dr.Douglas Delena Bali with [name],listedto provide transition of care.  PT/ OTevaluationnotereviewedandrecommending skilled nursing facility (SNF). Per transition of care SW note, expected discharge is skilled nursing facility and patient was agreeable. Prior to admission, patient is from home with the support of wife (please contact home phone first to contact wife then cell phone second).   If there are changes incurrentdisposition, pleaserefer to [name] care management forfollow-upof needsas appropriate.  Of note, [facility] Care Management services  does not replace or interfere with any services that are arranged by transition of care case management or social work.  [facility] post acute RN coordinator will be made aware of patient's disposition to any facility covered by [name] care management for post discharge follow up of needs.   For questions and additional information, please call:  Ayisha Pol A. Blayre Papania, BSN, RN-BC [facility] Liaison Cell: [phone]

## 2018-12-18 NOTE — TOC Initial Note (Signed)
Transition of Care [facility]) - Initial/Assessment Note    Patient Details  Name: [name] MRN: [id number] Date of Birth: Oct 28, 1931  Transition of Care [facility]) CM/SW Contact:    Bethena Roys, RN Phone Number: 12/18/2018, 3:15 PM  Clinical Narrative:  Pt presented for CHF- initially on BIPAP and weaned off, now on Oxygen at 3 Liters (home dose). Patient has DME 02 via [place] Patient's- patient also has DME Rollator. PTA from home with the support of wife. Please contact home phone first to contact wife then cell second. Pt was previously active with [facility]. CM did speak with patient and he is agreeable to to SNF- he asked me to contact wife for facility choice. Wife stated that [facility]/ [facility]  Only two choices secondary to distance from the patients home. CM did Fax out to both facilities- awaiting to see if the patient has been accepted and if bed available. CM will continue to monitor for additional transition of care needs.                   Expected Discharge Plan: Skilled Nursing Facility Barriers to Discharge: Continued Medical Work up   Patient Goals and CMS Choice   CMS Medicare.gov Compare Post Acute Care list provided to:: Patient Represenative (must comment)(received verbal permission-spoke with wife via phone) Choice offered to / list presented to : Spouse  Expected Discharge Plan and Services Expected Discharge Plan: [place] In-house Referral: NA Discharge Planning Services: CM Consult Post Acute Care Choice: [place] Living arrangements for the past 2 months: Single Family Home                                      Prior Living Arrangements/Services Living arrangements for the past 2 months: Single Family Home Lives with:: Spouse Patient language and need for interpreter reviewed:: Yes Do you feel safe going back to the place where you live?: Yes(however, agreeable to SNF)       Need for Family Participation in Patient Care: Yes (Comment) Care giver support system in place?: Yes (comment) Current home services: DME(Pt has DME Rollator and Oxygen at 3 Liters via [place] Patient) Criminal Activity/Legal Involvement Pertinent to Current Situation/Hospitalization: No - Comment as needed  Activities of Daily Living Home Assistive Devices/Equipment: Oxygen, Dentures (specify type) ADL Screening (condition at time of admission) Patient's cognitive ability adequate to safely complete daily activities?: Yes Is the patient deaf or have difficulty hearing?: No Does the patient have difficulty seeing, even when wearing glasses/contacts?: No Does the patient have difficulty concentrating, remembering, or making decisions?: No Patient able to express need for assistance with ADLs?: Yes Does the patient have difficulty dressing or bathing?: No Independently performs ADLs?: Yes (appropriate for developmental age) Does the patient have difficulty walking or climbing stairs?: Yes Weakness of Legs: Both Weakness of Arms/Hands: Both  Permission Sought/Granted Permission sought to share information with : Family Supports, Customer service manager Permission granted to share information with : Yes, Verbal Permission Granted  Share Information with NAME: Dartanian Knaggs [phone]  Permission granted to share info w AGENCY: [facility]        Emotional Assessment Appearance:: Appears stated age Attitude/Demeanor/Rapport: Engaged Affect (typically observed): Accepting Orientation: : Oriented to Self, Oriented to Place, Oriented to  Time, Oriented to Situation Alcohol / Substance Use: Not Applicable Psych  Involvement: No (comment)  Admission diagnosis:  SOB (shortness of breath) [R06.02] Pleural effusion [J90] CHF (congestive heart failure) ([place]) [I50.9] Patient Active Problem List   Diagnosis Date Noted  . CHF (congestive heart  failure) ([place]) 12/17/2018  . Pressure injury of skin 12/17/2018  . Hypoxemia   . Pulmonary embolism and infarction ([place])   . Acute on chronic respiratory failure with hypoxia ([place])   . SOB (shortness of breath)   . Cellulitis 11/15/2018  . 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 Severe aortic stenosis 09/17/2017  . Mitral stenosis   . Aftercare following surgery of the circulatory system 03/15/2014  . Carotid stenosis 03/15/2014  . Unstable angina ([place]) 08/31/2013  . Acute myocardial infarction, unspecified site, initial episode of care   . Aortic stenosis   . Atrial fibrillation ([place])   . Vertigo 06/27/2013  . Chest pain with moderate risk of acute coronary syndrome 06/27/2013  . CAD - CABG '94, low risk Myoview May 2014 06/27/2013  . Chronic atrial fibrillation 06/27/2013  . Chronic anticoagulation 06/27/2013  . Gout attack- on steroid dose pack 06/27/2013  . Tachycardia- beta blocker increased 06/26/2013  . Essential hypertension, benign 03/10/2009  . HYPERLIPIDEMIA 09/09/2008  . UNSPECIFIED ANEMIA 09/09/2008  . CAROTID STENOSIS- moderate 09/09/2008  . UNSPECIFIED CEREBROVASCULAR DISEASE 09/09/2008  . RENAL ARTERY STENOSIS- moderate 09/09/2008  . PERIPHERAL VASCULAR DISEASE 09/09/2008  . PEPTIC ULCER DISEASE, HX OF 09/09/2008   PCP:  Nicoletta Dress, MD Pharmacy:   [facility], [place] [address] Phone: [phone] Fax: [place], [place] - [address] [place] [address] Phone: [phone] Fax: [phone]     Social Determinants of Health (SDOH) Interventions    Readmission Risk Interventions No flowsheet data found.

## 2018-12-18 NOTE — Progress Notes (Signed)
Family Medicine Teaching Service Daily Progress Note Intern Pager: [phone]  Patient name: [name] Medical record number: [id number] Date of birth: [birth year] Age: 85 y.o. Gender: male  Primary Care Provider: Nicoletta Dress, MD Consultants:  Code Status: Full   Pt Overview and Major Events to Date:  [name] is a 85 y.o. male presenting with decompensated heart failure. PMH is significant for HFpEF, CAD s/p CABG (1994), s/p TAVR, A-fib, COPD, recent PE (11/2018), hospitalization 6/20 for cellulitis of left forearm, iron deficiency anemia  Assessment and Plan:  Acute hypoxic respiratory failure secondary to acute on chronic heart failure with preserved ejection fraction (EF 50-55% 11/24/2018) Sats 98% on 3L this morning at 06:45 92% on air RR 36 Bi-basal crackles up to midzone Weight 85.8 kg, 89 kg on admission  -Lasix IV 40 mg twice daily -Strict Ins and outs -Daily weights -Hold home beta-blocker and ACE inhibitor -Monitor daily BMP -Consider consult cardiology if worsening -PT/OT  A. fib, rate controlled, chronic HR 110 -Started Carvedilol 3.125mg  -Continue to hold metoprolol due to decompensated heart failure -Continue apixaban 5 mg twice daily  CAD, s/p CABG (1994)-stable -Continue atorvastatin 80 mg daily -Monitor for chest pain  Diabetes, type II CBG 104 Home medication includes metformin XR 500 daily. -SSI sensitive -Hold home metformin -Monitor CBGs  Left forearm cellulitis, improved, stable -Continue to monitor  COPD, chronic, controlled -Continue home Incruse Ellipta -Start Brio Ellipta, substitute for Symbicort -Continue home albuterol every 4 hours as needed for wheezing  BPH -insert foley catheter as has had in and outs X 3 -monitor urine output -consider tamsulosin-check if patient has an allergy   FEN/GI: Heart healthy/carb modified Prophylaxis: Continue apixaban   Disposition:  2-3 days of hospitalization anticipated  prior to discharge  Subjective:  Pt feels better than yesterday. Breathing has improves. Denies chest pain, abdo pain. No vomiting. No new concerns   Objective: Temp:  [98.1 F (36.7 C)-98.5 F (36.9 C)] 98.5 F (36.9 C) (07/16 0402) Pulse Rate:  [89-112] 112 (07/16 0800) Resp:  [14-35] 20 (07/16 0800) BP: (117-141)/(56-74) 129/56 (07/16 0402) SpO2:  [97 %-100 %] 100 % (07/16 0800) FiO2 (%):  [40 %-60 %] 40 % (07/15 2027) Weight:  [85.5 kg-89 kg] 85.5 kg (07/16 0538)   General: Alert and cooperative and appears to be in no acute distress, nasal cannula in situ  HEENT: Neck non-tender without lymphadenopathy, masses or thyromegaly Cardio: Normal S1 and S2, no S3 or S4. Rhythm is irregular. No murmurs or rubs.   Pulm: bilateral crackles up to midzone. No wheezing, or diminished breath sounds. Diminished respiratory effort Abdomen: Bowel sounds normal. Abdomen soft and non-tender.  Extremities: No peripheral edema. Warm/ well perfused. Normal cap refill.  Neuro: Cranial nerves grossly intact  Laboratory: Recent Labs  Lab 12/17/18 0557 12/17/18 0613 12/17/18 1128 12/18/18 0433  WBC 8.0  --   --  5.7  HGB 10.4* 10.9* 9.2* 9.0*  HCT 33.1* 32.0* 27.0* 28.0*  PLT 203  --   --  209   Recent Labs  Lab 12/17/18 0557 12/17/18 0613 12/17/18 1128 12/18/18 0433  NA  --  136 137 139  K  --  4.3 3.5 3.4*  CL  --  94*  --  99  CO2  --   --   --  30  BUN  --  7*  --  5*  CREATININE  --  0.60*  --  0.72  CALCIUM  --   --   --  8.8*  PROT 5.6*  --   --   --   BILITOT 0.7  --   --   --   ALKPHOS 138*  --   --   --   ALT 25  --   --   --   AST 22  --   --   --   GLUCOSE  --  167*  --  104*     Imaging/Diagnostic Tests: Ct Angio Chest Pe W And/or Wo Contrast  Result Date: 12/17/2018 CLINICAL DATA:  Shortness of breath. EXAM: CT ANGIOGRAPHY CHEST WITH CONTRAST TECHNIQUE: Multidetector CT imaging of the chest was performed using the standard protocol during bolus administration  of intravenous contrast. Multiplanar CT image reconstructions and MIPs were obtained to evaluate the vascular anatomy. CONTRAST:  146mL OMNIPAQUE IOHEXOL 350 MG/ML SOLN COMPARISON:  CT scan of November 23, 2018. FINDINGS: Cardiovascular: Satisfactory opacification of the pulmonary arteries to the segmental level. No evidence of pulmonary embolism. Mild cardiomegaly is noted. Status post transcatheter aortic valve repair. Coronary artery calcifications are noted. Status post coronary bypass graft. Atherosclerosis of thoracic aorta is noted without aneurysm formation. No pericardial effusion. Mediastinum/Nodes: No enlarged mediastinal, hilar, or axillary lymph nodes. Thyroid gland, trachea, and esophagus demonstrate no significant findings. Lungs/Pleura: No pneumothorax is noted. Moderate size bilateral pleural effusions are noted with adjacent subsegmental atelectasis of both lower lobes. Stable mild left upper lobe subsegmental atelectasis or scarring is noted. Upper Abdomen: No acute abnormality. Musculoskeletal: No chest wall abnormality. No acute or significant osseous findings. Review of the MIP images confirms the above findings. IMPRESSION: No definite evidence of pulmonary embolus. Moderate bilateral pleural effusions are noted with adjacent subsegmental atelectasis. Status post coronary bypass grafting and transcatheter aortic valve replacement. Aortic Atherosclerosis (ICD10-I70.0).1 Electronically Signed   By: Marijo Conception M.D.   On: 12/17/2018 09:49   Dg Chest Portable 1 View  Result Date: 12/17/2018 CLINICAL DATA:  Shortness of breath for 3 days EXAM: PORTABLE CHEST 1 VIEW COMPARISON:  11/22/2018 FINDINGS: Chronic moderate pleural effusions obscuring the lower lungs. Chronic cardiomegaly status post transcatheter aortic valve replacement. No Kerley lines or air bronchogram. No pneumothorax IMPRESSION: Cardiomegaly and moderate pleural effusions also seen on imaging in June 2020. Electronically Signed    By: Monte Fantasia M.D.   On: 12/17/2018 06:31    Lattie Haw, MD 12/18/2018, 9:05 AM PGY-1, [place] Intern pager: [phone], text pages welcome

## 2018-12-18 NOTE — Progress Notes (Signed)
Dr Posey Pronto was in to see patient and updated on VS, labs, inability to urinate, EKG this AM and weight this AM. Patient was again bladder scanned for 290cc maximum amount and he's still not uncomfortable at this time, will continue to monitor, no orders at this time.

## 2018-12-18 NOTE — Evaluation (Signed)
Physical Therapy Evaluation Patient Details Name: [name] MRN: [id number] DOB: 11-Jun-1931 Today's Date: 12/18/2018   History of Present Illness  Pt is a 85 y/o male admitted with decompensated heart failure with acute hypoxic respiratory failure with preserved ejection fraction (EF 50-55% 11/24/2018). PMH is significant for HFpEF, CAD s/p CABG (1994), s/p TAVR, A-fib, COPD on home O2, recent PE (11/2018), DM, depression, anxiety, HTN, HLD, hospitalization 6/20 for cellulitis of left forearm.  Clinical Impression  Pt in bed on arrival stating prior to last admission 3 weeks ago he was walking 300' 3x/day and since recent D/C has barely walked 50' to the bathroom with increased weakness and instability and fear of falling. Pt demonstrates bil LE weakness, fatigue, limited transfers, mobility and activity tolerance who currently requires more than just HHPT to return to PLOF with pt agreeable to SNF. Pt with HR 104-130 with limited activity and SpO2 93-97% on 3L. Pt educated for progression of mobility and need for daily OOB with nursing as well as bil LE HEP. Pt reports 2/4 DOE with bil LE HEP performed.      Follow Up Recommendations SNF;Supervision/Assistance - 24 hour    Equipment Recommendations  None recommended by PT    Recommendations for Other Services OT consult     Precautions / Restrictions Precautions Precautions: Fall      Mobility  Bed Mobility Overal bed mobility: Needs Assistance Bed Mobility: Supine to Sit     Supine to sit: Min guard;HOB elevated     General bed mobility comments: increased time with use of railing  Transfers Overall transfer level: Needs assistance   Transfers: Sit to/from Stand;Stand Pivot Transfers Sit to Stand: Min guard;From elevated surface Stand pivot transfers: Min guard       General transfer comment: elevated bed height to mimic home, cues for hand placement as pt pulling up on RW. Pt walked 2 steps forward then stated need  to sit and back to chair with cues. Pt limited by fatigue  Ambulation/Gait             General Gait Details: pt unable to attempt today limited by fatigue and fear of falling  Stairs            Wheelchair Mobility    Modified Rankin (Stroke Patients Only)       Balance Overall balance assessment: Needs assistance   Sitting balance-Leahy Scale: Good Sitting balance - Comments: able to sit EOB without UE support     Standing balance-Leahy Scale: Poor Standing balance comment: bil UE support on RW in standing                             Pertinent Vitals/Pain Pain Assessment: 0-10 Pain Score: 3  Pain Location: low back pain Pain Descriptors / Indicators: Aching Pain Intervention(s): Limited activity within patient's tolerance;Repositioned;Monitored during session    Home Living Family/patient expects to be discharged to:: Private residence Living Arrangements: Spouse/significant other Available Help at Discharge: Family;Available 24 hours/day Type of Home: House Home Access: Stairs to enter Entrance Stairs-Rails: Right;Left   Home Layout: One level Home Equipment: Cane - single point;Walker - 2 wheels;Shower seat;Bedside commode;Crutches      Prior Function Level of Independence: Independent with assistive device(s)         Comments: uses RW or cane for gait in the house max of 300'     Hand Dominance  Extremity/Trunk Assessment   Upper Extremity Assessment Upper Extremity Assessment: Generalized weakness    Lower Extremity Assessment Lower Extremity Assessment: Generalized weakness;LLE deficits/detail;RLE deficits/detail RLE Deficits / Details: hip flexion 4/5, knee extension 3+/5, knee flexion 4/5 LLE Deficits / Details: hip flexion 4/5, knee extension 3+/5, knee flexion 4/5    Cervical / Trunk Assessment Cervical / Trunk Assessment: Other exceptions Cervical / Trunk Exceptions: rounded shoulders  Communication    Communication: HOH  Cognition Arousal/Alertness: Awake/alert Behavior During Therapy: Flat affect Overall Cognitive Status: Within Functional Limits for tasks assessed                                        General Comments      Exercises General Exercises - Lower Extremity Long Arc Quad: AROM;15 reps;Both;Seated Hip Flexion/Marching: AROM;Both;10 reps;Seated   Assessment/Plan    PT Assessment Patient needs continued PT services  PT Problem List Decreased strength;Decreased mobility;Decreased safety awareness;Decreased activity tolerance;Cardiopulmonary status limiting activity;Pain;Decreased balance;Impaired sensation;Decreased knowledge of use of DME       PT Treatment Interventions DME instruction;Therapeutic activities;Gait training;Therapeutic exercise;Balance training;Stair training;Functional mobility training;Patient/family education    PT Goals (Current goals can be found in the Care Plan section)  Acute Rehab PT Goals Patient Stated Goal: be able to function again PT Goal Formulation: With patient Time For Goal Achievement: 01/01/19 Potential to Achieve Goals: Fair    Frequency Min 3X/week   Barriers to discharge Decreased caregiver support;Inaccessible home environment      Co-evaluation               AM-PAC PT "6 Clicks" Mobility  Outcome Measure Help needed turning from your back to your side while in a flat bed without using bedrails?: A Little Help needed moving from lying on your back to sitting on the side of a flat bed without using bedrails?: A Little Help needed moving to and from a bed to a chair (including a wheelchair)?: A Little Help needed standing up from a chair using your arms (e.g., wheelchair or bedside chair)?: A Little Help needed to walk in hospital room?: A Lot Help needed climbing 3-5 steps with a railing? : A Lot 6 Click Score: 16    End of Session Equipment Utilized During Treatment: Oxygen;Gait  belt Activity Tolerance: Patient limited by fatigue Patient left: in chair;with call bell/phone within reach;with chair alarm set Nurse Communication: Mobility status PT Visit Diagnosis: Unsteadiness on feet (R26.81);Muscle weakness (generalized) (M62.81);Other abnormalities of gait and mobility (R26.89)    Time: 9407-6808 PT Time Calculation (min) (ACUTE ONLY): 25 min   Charges:   PT Evaluation $PT Eval Moderate Complexity: 1 Mod PT Treatments $Therapeutic Activity: 8-22 mins        Otisha Spickler Pam Drown, PT Acute Rehabilitation Services Pager: [phone] Office: [phone]   Justina Bertini B Linas Stepter 12/18/2018, 11:47 AM

## 2018-12-18 NOTE — NC FL2 (Signed)
[place] LEVEL OF CARE SCREENING TOOL     IDENTIFICATION  Patient Name: [name] Birthdate: Jul 25, 1931 Sex: male Admission Date (Current Location): 12/17/2018  [facility] and [place] Number:  Herbalist and Address:         Provider Number: [id number]  Attending Physician Name and Address:  Lind Covert, MD  Relative Name and Phone Number:       Current Level of Care: Hospital Recommended Level of Care: [place] Prior Approval Number:    Date Approved/Denied:   PASRR Number: [id number]  Discharge Plan: SNF    Current Diagnoses: Patient Active Problem List   Diagnosis Date Noted  . CHF (congestive heart failure) ([place]) 12/17/2018  . Pressure injury of skin 12/17/2018  . Hypoxemia   . Pulmonary embolism and infarction ([place])   . Acute on chronic respiratory failure with hypoxia ([place])   . SOB (shortness of breath)   . Cellulitis 11/15/2018  . 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 Severe aortic stenosis 09/17/2017  . Mitral stenosis   . Aftercare following surgery of the circulatory system 03/15/2014  . Carotid stenosis 03/15/2014  . Unstable angina ([place]) 08/31/2013  . Acute myocardial infarction, unspecified site, initial episode of care   . Aortic stenosis   . Atrial fibrillation ([place])   . Vertigo 06/27/2013  . Chest pain with moderate risk of acute coronary syndrome 06/27/2013  . CAD - CABG '94, low risk Myoview May 2014 06/27/2013  . Chronic atrial fibrillation 06/27/2013  . Chronic anticoagulation 06/27/2013  . Gout attack- on steroid dose pack 06/27/2013  . Tachycardia- beta blocker increased 06/26/2013  . Essential hypertension, benign 03/10/2009  . HYPERLIPIDEMIA 09/09/2008  . UNSPECIFIED ANEMIA 09/09/2008  . CAROTID STENOSIS- moderate 09/09/2008  . UNSPECIFIED CEREBROVASCULAR DISEASE 09/09/2008  . RENAL ARTERY STENOSIS- moderate 09/09/2008  . PERIPHERAL VASCULAR DISEASE  09/09/2008  . PEPTIC ULCER DISEASE, HX OF 09/09/2008    Orientation RESPIRATION BLADDER Height & Weight     Self, Time, Situation, Place  O2, Normal(Oxygen at 3 Liters [place]) Continent Weight: 85.5 kg Height:  6' (182.9 cm)  BEHAVIORAL SYMPTOMS/MOOD NEUROLOGICAL BOWEL NUTRITION STATUS      Continent Diet(Diet heart healthy/carb modified Fluid consistency: Thin)  AMBULATORY STATUS COMMUNICATION OF NEEDS Skin   Extensive Assist(Pt demonstrates bil LE weakness, fatigue, limited transfers, mobility and activity tolerance) Verbally Normal                       Personal Care Assistance Level of Assistance  Bathing, Feeding, Dressing Bathing Assistance: Limited assistance Feeding assistance: Independent Dressing Assistance: Limited assistance     Functional Limitations Info  Sight, Hearing, Speech Sight Info: Adequate Hearing Info: Adequate Speech Info: Adequate    SPECIAL CARE FACTORS FREQUENCY  PT (By licensed PT), OT (By licensed OT)     PT Frequency: 5 x week OT Frequency: 5 x week            Contractures Contractures Info: Not present    Additional Factors Info  Code Status, Allergies, Insulin Sliding Scale Code Status Info: Full Allergies Info: Bactrim Sulfamethoxazole-trimethoprim,Levaquin Levofloxacin In D5w   Insulin Sliding Scale Info: insulin aspart novoLOG injection Subcutaneous  3 times daily with meals       Current Medications (12/18/2018):  This is the current hospital active medication list Current Facility-Administered Medications  Medication Dose Route Frequency Provider Last Rate Last Dose  . 0.9 %  sodium chloride  infusion  250 mL Intravenous PRN Matilde Haymaker, MD      . acetaminophen (TYLENOL) tablet 650 mg  650 mg Oral Q4H PRN Matilde Haymaker, MD   650 mg at 12/18/18 0830  . acetaminophen-codeine (TYLENOL #3) 300-30 MG per tablet 1 tablet  1 tablet Oral Q8H PRN Sherene Sires, DO   1 tablet at 12/18/18 1335  . albuterol (PROVENTIL) (2.5 MG/3ML)  0.083% nebulizer solution 2.5 mg  2.5 mg Nebulization Q4H PRN Leeanne Rio, MD      . apixaban Arne Cleveland) tablet 5 mg  5 mg Oral BID Matilde Haymaker, MD   5 mg at 12/18/18 0830  . atorvastatin (LIPITOR) tablet 80 mg  80 mg Oral QHS Matilde Haymaker, MD   80 mg at 12/17/18 2144  . brimonidine (ALPHAGAN) 0.2 % ophthalmic solution 1 drop  1 drop Both Eyes BID Matilde Haymaker, MD   1 drop at 12/18/18 0831  . carvedilol (COREG) tablet 3.125 mg  3.125 mg Oral BID WC Anderson, Chelsey L, DO      . chlorhexidine (PERIDEX) 0.12 % solution 15 mL  15 mL Mouth Rinse BID Leeanne Rio, MD   15 mL at 12/18/18 0831  . ferrous sulfate tablet 325 mg  325 mg Oral Q breakfast Matilde Haymaker, MD   325 mg at 12/18/18 0830  . fluticasone furoate-vilanterol (BREO ELLIPTA) 100-25 MCG/INH 1 puff  1 puff Inhalation Daily Matilde Haymaker, MD   1 puff at 12/18/18 0931  . furosemide (LASIX) injection 40 mg  40 mg Intravenous BID Matilde Haymaker, MD   40 mg at 12/18/18 0831  . insulin aspart (novoLOG) injection 0-9 Units  0-9 Units Subcutaneous TID WC Matilde Haymaker, MD   1 Units at 12/18/18 1209  . MEDLINE mouth rinse  15 mL Mouth Rinse q12n4p Leeanne Rio, MD   15 mL at 12/18/18 1209  . methocarbamol (ROBAXIN) tablet 750 mg  750 mg Oral BID PRN Matilde Haymaker, MD   750 mg at 12/18/18 0538  . multivitamin with minerals tablet 1 tablet  1 tablet Oral Daily Matilde Haymaker, MD   1 tablet at 12/18/18 0830  . pantoprazole (PROTONIX) EC tablet 40 mg  40 mg Oral Daily Matilde Haymaker, MD   40 mg at 12/18/18 0830  . sodium chloride flush (NS) 0.9 % injection 3 mL  3 mL Intravenous Q12H Matilde Haymaker, MD   3 mL at 12/18/18 0927  . sodium chloride flush (NS) 0.9 % injection 3 mL  3 mL Intravenous PRN Matilde Haymaker, MD      . terazosin (HYTRIN) capsule 5 mg  5 mg Oral QHS Matilde Haymaker, MD   5 mg at 12/17/18 2143  . umeclidinium bromide (INCRUSE ELLIPTA) 62.5 MCG/INH 1 puff  1 puff Inhalation Daily Matilde Haymaker, MD   1 puff at 12/18/18 1610      Discharge Medications: Please see discharge summary for a list of discharge medications.  Relevant Imaging Results:  Relevant Lab Results:   Additional Information [id number]  Bethena Roys, RN

## 2018-12-19 ENCOUNTER — Inpatient Hospital Stay (HOSPITAL_COMMUNITY): Payer: Medicare Other

## 2018-12-19 LAB — BASIC METABOLIC PANEL
Anion gap: 9 (ref 5–15)
BUN: 10 mg/dL (ref 8–23)
CO2: 30 mmol/L (ref 22–32)
Calcium: 8.6 mg/dL — ABNORMAL LOW (ref 8.9–10.3)
Chloride: 98 mmol/L (ref 98–111)
Creatinine, Ser: 0.77 mg/dL (ref 0.61–1.24)
GFR calc Af Amer: >60 mL/min (ref >60)
GFR calc non Af Amer: >60 mL/min (ref >60)
Glucose, Bld: 118 mg/dL — ABNORMAL HIGH (ref 70–99)
Potassium: 3.7 mmol/L (ref 3.5–5.1)
Sodium: 137 mmol/L (ref 135–145)

## 2018-12-19 LAB — CBC
HCT: 27.8 % — ABNORMAL LOW (ref 39.0–52.0)
Hemoglobin: 9 g/dL — ABNORMAL LOW (ref 13.0–17.0)
MCH: 28.9 pg (ref 26.0–34.0)
MCHC: 32.4 g/dL (ref 30.0–36.0)
MCV: 89.4 fL (ref 80.0–100.0)
Platelets: 193 10*3/uL (ref 150–400)
RBC: 3.11 MIL/uL — ABNORMAL LOW (ref 4.22–5.81)
RDW: 16.2 % — ABNORMAL HIGH (ref 11.5–15.5)
WBC: 6.3 10*3/uL (ref 4.0–10.5)
nRBC: 0 % (ref 0.0–0.2)

## 2018-12-19 LAB — GLUCOSE, CAPILLARY
Glucose-Capillary: 108 mg/dL — ABNORMAL HIGH (ref 70–99)
Glucose-Capillary: 140 mg/dL — ABNORMAL HIGH (ref 70–99)
Glucose-Capillary: 220 mg/dL — ABNORMAL HIGH (ref 70–99)
Glucose-Capillary: 138 mg/dL — ABNORMAL HIGH (ref 70–99)
Glucose-Capillary: 154 mg/dL — ABNORMAL HIGH (ref 70–99)

## 2018-12-19 MED ORDER — ALBUTEROL SULFATE (2.5 MG/3ML) 0.083% IN NEBU: 2.5000 mg | INHALATION_SOLUTION | Freq: Three times a day (TID) | RESPIRATORY_TRACT | Status: DC

## 2018-12-19 MED ORDER — LEVALBUTEROL HCL 0.63 MG/3ML IN NEBU
0.6300 mg | INHALATION_SOLUTION | Freq: Three times a day (TID) | RESPIRATORY_TRACT | Status: DC
  Administered 2018-12-19: 0.63 mg via RESPIRATORY_TRACT

## 2018-12-19 MED ORDER — PREDNISONE 20 MG PO TABS: 40.0000 mg | ORAL_TABLET | Freq: Every day | ORAL | Status: DC

## 2018-12-19 MED ORDER — ACETAMINOPHEN 500 MG PO TABS
1000.0000 mg | ORAL_TABLET | Freq: Once | ORAL | Status: AC
  Administered 2018-12-19: 1000 mg via ORAL
  Filled 2018-12-19: qty 2

## 2018-12-19 MED ORDER — TAMSULOSIN HCL 0.4 MG PO CAPS
0.4000 mg | ORAL_CAPSULE | Freq: Every day | ORAL | Status: DC
  Administered 2018-12-19 – 2018-12-22 (×4): 0.4 mg via ORAL
  Filled 2018-12-19 (×4): qty 1

## 2018-12-19 MED ORDER — LEVALBUTEROL HCL 0.63 MG/3ML IN NEBU
0.6300 mg | INHALATION_SOLUTION | Freq: Four times a day (QID) | RESPIRATORY_TRACT | Status: DC
  Administered 2018-12-19 – 2018-12-21 (×6): 0.63 mg via RESPIRATORY_TRACT
  Filled 2018-12-19 (×7): qty 3

## 2018-12-19 MED ORDER — LEVALBUTEROL HCL 0.63 MG/3ML IN NEBU: 0.6300 mg | INHALATION_SOLUTION | RESPIRATORY_TRACT | Status: DC

## 2018-12-19 MED ORDER — ALBUTEROL SULFATE (2.5 MG/3ML) 0.083% IN NEBU: 2.5000 mg | INHALATION_SOLUTION | RESPIRATORY_TRACT | Status: DC | PRN

## 2018-12-19 MED ORDER — ACETAMINOPHEN-CODEINE #3 300-30 MG PO TABS
1.0000 | ORAL_TABLET | Freq: Three times a day (TID) | ORAL | Status: DC | PRN
  Administered 2018-12-20 – 2018-12-22 (×2): 1 via ORAL
  Filled 2018-12-19 (×2): qty 1

## 2018-12-19 MED ORDER — IPRATROPIUM-ALBUTEROL 0.5-2.5 (3) MG/3ML IN SOLN
3.0000 mL | RESPIRATORY_TRACT | Status: DC
  Filled 2018-12-19: qty 3

## 2018-12-19 MED ORDER — IPRATROPIUM BROMIDE 0.02 % IN SOLN
0.5000 mg | Freq: Four times a day (QID) | RESPIRATORY_TRACT | Status: DC
  Administered 2018-12-19 – 2018-12-21 (×7): 0.5 mg via RESPIRATORY_TRACT
  Filled 2018-12-19 (×7): qty 2.5

## 2018-12-19 MED ORDER — PREDNISONE 20 MG PO TABS
40.0000 mg | ORAL_TABLET | Freq: Every day | ORAL | Status: DC
  Administered 2018-12-19 – 2018-12-22 (×4): 40 mg via ORAL
  Filled 2018-12-19 (×4): qty 2

## 2018-12-19 MED ORDER — ACETAMINOPHEN 325 MG PO TABS: 650.0000 mg | ORAL_TABLET | ORAL | Status: DC | PRN

## 2018-12-19 NOTE — Care Management Important Message (Signed)
Important Message  Patient Details  Name: [name] MRN: [id number] Date of Birth: 23-Apr-1932   Medicare Important Message Given:  Yes     [name] 12/19/2018, 12:57 PM

## 2018-12-19 NOTE — Progress Notes (Addendum)
RN called due to patient's back pain. Went to evaluate patient. Patient states this is his normal chronic back pain and he usually takes Tylenol.[name] He says this pain is not worse than his normal back pain but he would still prefer something stronger than tylenol. Discussed options and explained why opioids would not be ideal in him especially given his breathing difficulties. Patient agrees to give a higher dose of tylenol a try alongside a heating pad. - Start tylenol 1,000 and K-pad.  - Could consider lidocaine patch or Voltaren gel in future if pain is still not well controlled.  General: Alert and oriented in no apparent distress Heart: Regular rate and rhythm  Lungs: Lung sounds faint but clear bilaterally, no wheezing, able to speak in full sentences. MSK: Generalized soreness around bilateral lower lumbar region of patient's back. No focal areas of vertebral tenderness.  Sensation in tact, no loss of movement in extremities

## 2018-12-19 NOTE — TOC Progression Note (Addendum)
Transition of Care [facility]) - Progression Note    Patient Details  Name: [name] MRN: [id number] Date of Birth: [birth year]  [facility] CM/SW Contact  Graves-Bigelow, Ocie Cornfield, RN Phone Number: 12/19/2018, 12:12 PM  Clinical Narrative:   CM did contact wife to make her aware that patient has been accepted at [facility]. Patient/wfie agreeable to [facility]- CM did reach out to admissions coordinator Levada Dy to make her aware. Patient may need BIPAP at facility along with oxygen. CM will continue to monitor. Last COVID Screen 12-17-18 negative. Will need another COVID screen prior to transfer. CM will continue to monitor for additional transition of care needs.   1247 12-19-18 Pt can only go to [facility] with BIPAP for sleep at night per Admissions Coordinator.   Expected Discharge Plan: [place] Barriers to Discharge: Continued Medical Work up  Expected Discharge Plan and Services Expected Discharge Plan: [place] In-house Referral: NA Discharge Planning Services: CM Consult Post Acute Care Choice: [place] Living arrangements for the past 2 months: Single Family Home                     Social Determinants of Health (SDOH) Interventions    Readmission Risk Interventions No flowsheet data found.

## 2018-12-19 NOTE — Care Management Important Message (Signed)
Important Message  Patient Details  Name: [name] MRN: [id number] Date of Birth: February 24, 1932   Medicare Important Message Given:  Yes     [name] 12/19/2018, 12:55 PM

## 2018-12-19 NOTE — Progress Notes (Addendum)
Family Medicine Teaching Service Daily Progress Note Intern Pager: [phone]  Patient name: [name] Medical record number: [id number] Date of birth: 12-24-1931 Age: 85 y.o. Gender: male  Primary Care Provider: Nicoletta Dress, MD Consultants:  Code Status: Full   Pt Overview and Major Events to Date:  [name] is a 85 y.o. male presenting with decompensated heart failure. PMH is significant for HFpEF, CAD s/p CABG (1994), s/p TAVR, A-fib, COPD, recent PE (11/2018), hospitalization 6/20 for cellulitis of left forearm, iron deficiency anemia.  Assessment and Plan:  Acute hypoxic respiratory failure secondary to acute on chronic heart failure with preserved ejection fraction (EF 50-55% 11/24/2018) Weight 85.59kg, 85.8kg 7/16, 89 kg on admission  Sats 98% on 3L this morning (home oxygen up to 3L) 85% on room air RR 22 Bi-basal crackles up to midzone and bilateral wheeze Urine output 1.55L I advised albuterol nebulisers after I saw [name], 30 mins later did not improve pts condition this morning. Seen by resp therapist who thought pt needed BiPAP Ordered CXR-pending results -Consider tap of pleural effusions -Consider consult cardiology if worsening -Lasix IV 40 mg twice daily-consider increasing to TID -Strict Ins and outs -Daily weights -Monitor daily BMP -PT/OT -Hold home beta-blocker and ACE inhibitor   COPD, chronic, controlled Bilateral wheezing today and increased work of breathing -Albuterol nebulisers PRN  -Continue home Incruse Ellipta, Brio Ellipta -Continue home albuterol every 4 hours as needed for wheezing  A. fib, rate controlled, chronic HR 110 -Started Carvedilol 3.125mg  7/16, consider increasing dose today -Continue to hold metoprolol due to decompensated heart failure -Continue apixaban 5 mg twice daily  CAD, s/p CABG (1994)-stable -Continue atorvastatin 80 mg daily -Monitor for chest pain  Diabetes, type II CBG 198  Home  medication:metformin XR 500 daily. -SSI sensitive -Hold home metformin -Monitor CBGs  Left forearm cellulitis, improved, stable -Continue to monitor  BPH -foley in situ -monitor urine output -consider tamsulosin, pt does not know if he has an allergy to tamsulosin, from his chart he has had it before   FEN/GI: Heart healthy/carb modified Prophylaxis: Continue apixaban   Disposition:  2-3 days of hospitalization anticipated prior to discharge  Subjective:  Pt does not feel as good as yesterday. Feels more dyspneic and has increased work of breathing. Back pain has not improved with tylenol+codeine and would like something stronger. Denies chest pain, fevers or vomiting. Speaks to wife daily which makes him feel better. Chronic heel ulcers also hurting.  Objective: Temp:  [97.8 F (36.6 C)-98.2 F (36.8 C)] 98.2 F (36.8 C) (07/17 0500) Pulse Rate:  [73-128] 109 (07/17 0500) Resp:  [18-33] 22 (07/17 0500) BP: (123-145)/(54-70) 124/54 (07/17 0500) SpO2:  [93 %-100 %] 98 % (07/17 0500) Weight:  [85.6 kg] 85.6 kg (07/17 0500)  General: pt alert, in respiratory distress, dyspnea when speaking in sentences, [place] 3L oxygen HEENT: Neck non-tender without lymphadenopathy, masses or thyromegaly Cardio: Normal S1 and S2, no S3 or S4. Rhythm is irregular. No murmurs or rubs.   Pulm: bibasal crackles, much improved since yesterday however bilateral wheeze, increased respiratory effort.  Abdomen: Bowel sounds normal. Abdomen soft, generalized mild, tenderness, no guarding. Only tender on palpation.  Extremities: No peripheral edema. Warm/ well perfused.  Weak radial pulse. Neuro: Cranial nerves grossly intact  Laboratory: Recent Labs  Lab 12/17/18 0557 12/17/18 0613 12/17/18 1128 12/18/18 0433  WBC 8.0  --   --  5.7  HGB 10.4* 10.9* 9.2* 9.0*  HCT 33.1* 32.0* 27.0* 28.0*  PLT 203  --   --  209   Recent Labs  Lab 12/17/18 0557 12/17/18 0613 12/17/18 1128 12/18/18 0433  NA   --  136 137 139  K  --  4.3 3.5 3.4*  CL  --  94*  --  99  CO2  --   --   --  30  BUN  --  7*  --  5*  CREATININE  --  0.60*  --  0.72  CALCIUM  --   --   --  8.8*  PROT 5.6*  --   --   --   BILITOT 0.7  --   --   --   ALKPHOS 138*  --   --   --   ALT 25  --   --   --   AST 22  --   --   --   GLUCOSE  --  167*  --  104*     Imaging/Diagnostic Tests: Ct Angio Chest Pe W And/or Wo Contrast  Result Date: 12/17/2018 CLINICAL DATA:  Shortness of breath. EXAM: CT ANGIOGRAPHY CHEST WITH CONTRAST TECHNIQUE: Multidetector CT imaging of the chest was performed using the standard protocol during bolus administration of intravenous contrast. Multiplanar CT image reconstructions and MIPs were obtained to evaluate the vascular anatomy. CONTRAST:  134mL OMNIPAQUE IOHEXOL 350 MG/ML SOLN COMPARISON:  CT scan of November 23, 2018. FINDINGS: Cardiovascular: Satisfactory opacification of the pulmonary arteries to the segmental level. No evidence of pulmonary embolism. Mild cardiomegaly is noted. Status post transcatheter aortic valve repair. Coronary artery calcifications are noted. Status post coronary bypass graft. Atherosclerosis of thoracic aorta is noted without aneurysm formation. No pericardial effusion. Mediastinum/Nodes: No enlarged mediastinal, hilar, or axillary lymph nodes. Thyroid gland, trachea, and esophagus demonstrate no significant findings. Lungs/Pleura: No pneumothorax is noted. Moderate size bilateral pleural effusions are noted with adjacent subsegmental atelectasis of both lower lobes. Stable mild left upper lobe subsegmental atelectasis or scarring is noted. Upper Abdomen: No acute abnormality. Musculoskeletal: No chest wall abnormality. No acute or significant osseous findings. Review of the MIP images confirms the above findings. IMPRESSION: No definite evidence of pulmonary embolus. Moderate bilateral pleural effusions are noted with adjacent subsegmental atelectasis. Status post coronary  bypass grafting and transcatheter aortic valve replacement. Aortic Atherosclerosis (ICD10-I70.0).1 Electronically Signed   By: Marijo Conception M.D.   On: 12/17/2018 09:49   Dg Chest Portable 1 View  Result Date: 12/17/2018 CLINICAL DATA:  Shortness of breath for 3 days EXAM: PORTABLE CHEST 1 VIEW COMPARISON:  11/22/2018 FINDINGS: Chronic moderate pleural effusions obscuring the lower lungs. Chronic cardiomegaly status post transcatheter aortic valve replacement. No Kerley lines or air bronchogram. No pneumothorax IMPRESSION: Cardiomegaly and moderate pleural effusions also seen on imaging in June 2020. Electronically Signed   By: Monte Fantasia M.D.   On: 12/17/2018 06:31    Lattie Haw, MD 12/19/2018, 5:58 AM PGY-1, [facility] Intern pager: [phone], text pages welcome

## 2018-12-20 LAB — BASIC METABOLIC PANEL
Anion gap: 11 (ref 5–15)
BUN: 11 mg/dL (ref 8–23)
CO2: 30 mmol/L (ref 22–32)
Calcium: 8.7 mg/dL — ABNORMAL LOW (ref 8.9–10.3)
Chloride: 95 mmol/L — ABNORMAL LOW (ref 98–111)
Creatinine, Ser: 0.74 mg/dL (ref 0.61–1.24)
GFR calc Af Amer: >60 mL/min (ref >60)
GFR calc non Af Amer: >60 mL/min (ref >60)
Glucose, Bld: 185 mg/dL — ABNORMAL HIGH (ref 70–99)
Potassium: 3.7 mmol/L (ref 3.5–5.1)
Sodium: 136 mmol/L (ref 135–145)

## 2018-12-20 LAB — GLUCOSE, CAPILLARY
Glucose-Capillary: 145 mg/dL — ABNORMAL HIGH (ref 70–99)
Glucose-Capillary: 229 mg/dL — ABNORMAL HIGH (ref 70–99)
Glucose-Capillary: 221 mg/dL — ABNORMAL HIGH (ref 70–99)
Glucose-Capillary: 178 mg/dL — ABNORMAL HIGH (ref 70–99)

## 2018-12-20 MED ORDER — FUROSEMIDE 40 MG PO TABS
40.0000 mg | ORAL_TABLET | Freq: Every day | ORAL | Status: DC
  Administered 2018-12-22: 40 mg via ORAL
  Filled 2018-12-20: qty 1

## 2018-12-20 MED ORDER — FUROSEMIDE 40 MG PO TABS
40.0000 mg | ORAL_TABLET | Freq: Two times a day (BID) | ORAL | Status: AC
  Administered 2018-12-21 (×2): 40 mg via ORAL
  Filled 2018-12-20 (×2): qty 1

## 2018-12-20 NOTE — Progress Notes (Addendum)
Family Medicine Teaching Service Daily Progress Note Intern Pager: [phone]  Patient name: [name] Medical record number: [id number] Date of birth: 12/30/1931 Age: 85 y.o. Gender: male  Primary Care Provider: Nicoletta Dress, MD Consultants:  Code Status: Full   Pt Overview and Major Events to Date:  [name] is a 85 y.o. male presenting with decompensated heart failure. PMH is significant for HFpEF, CAD s/p CABG (1994), s/p TAVR, A-fib, COPD, recent PE (11/2018), hospitalization 6/20 for cellulitis of left forearm, iron deficiency anemia.  Assessment and Plan:  Acute hypoxic respiratory failure secondary to acute on chronic heart failure with preserved ejection fraction (EF 50-55% 11/24/2018) Pt feels a little better today, dyspnea improving, not visibly tachypniec Right sided basal crackles, reduced air entry at bases. No wheeze  Weight 85.5kg today, (85.6kg 7/17, 85.8kg 7/16, 89 kg on admission) Sats 99% on 3L this morning (home oxygen up to 3L) RR 30 Urine output 1.4L yesterday  Repeat CXR ordered 7/17 due to increased tachypnea: small bilateral pleural effusions (compared to moderate pleural effusions on prior CXR on admission), cardiomegaly   -Continue IV Lasix 40 mg twice daily-consider reducing today as per Dr Erin Hearing  -Strict Ins and outs -Daily weights -Continue telemetry -Monitor daily BMP -PT/OT -Hold home beta-blocker and ACE inhibitor  Chronic COPD-likely exacerbation  Pt not tachypniec-improved from yesterday, right sided basal crackles, reduced air entry at bases. No wheeze.   -Started on Prednisone 40mg  for 5 days 7/17 -Levalbuterol Q6hrly, and Ipratropium Q6hrly started on 7/17 -Continue home Icruse Ellipta, Brio Ellipta -Continue home albuterol Q4PRN -Respiratory therapy consult-considering incentive spirometry, appreciate recs  A. fib, rate controlled-treating HR 96-112 -Started Carvedilol 3.125mg  7/16-consider increasing dose if HR not  controlled -Continue to hold metoprolol due to decompensated heart failure -Anticoagulation: continue apixaban 5 mg twice daily  CAD, s/p CABG (1994)-stable -Continue atorvastatin 80 mg daily -Monitor for chest pain  Diabetes, type II-stable CBG 154  Home medication:metformin XR 500 daily. -SSI sensitive -Hold home metformin -Monitor CBGs  Left forearm cellulitis, improved, stable -Continue to monitor  BPH -foley in situ-void trial today -monitor urine output -Tamsulosin started 7/17  FEN/GI: Heart healthy/carb modified Prophylaxis: Continue apixaban  Post noted addendum -Void trial tomorrow -d/c plan for beta blocker: discharge on current dose of carvedilol, not metoprolol (Pt was taking at home) -IV furosemide stopped, start furosemide BID tomorrow, then once daily the day after -PT/OT  Disposition:  2-3 days of hospitalization anticipated prior to discharge  Subjective:  Pt feeling better compared to yesterday, feels better post nebulizer therapy. Denies chest pain, nausea or vomiting. Eating well. No new concerns.  Objective: Temp:  [97.4 F (36.3 C)-97.7 F (36.5 C)] 97.4 F (36.3 C) (07/18 0551) Pulse Rate:  [88-134] 96 (07/18 0552) Resp:  [20-35] 21 (07/18 0552) BP: (121-139)/(53-74) 139/74 (07/18 0552) SpO2:  [94 %-100 %] 100 % (07/18 0552) Weight:  [85.5 kg] 85.5 kg (07/18 0228)  General: Sleepy but rousable, cooperative and appears to be in no acute distress HEENT: Neck non-tender without lymphadenopathy, masses or thyromegaly Cardio: Normal S1 and S2, no S3 or S4. Rhythm is rregular, tachycardia. No murmurs or rubs.   Pulm: crackles right lung base, reduced air entry at bases, nowheezing. Moderate respiratory effort-improved from yesterday  Abdomen: Bowel sounds normal. Abdomen soft and non-tender.  Extremities: mild pitting edema of lower extremity, Warm/ well perfused.   Neuro: Cranial nerves grossly intact, moving all 4  limbs  Laboratory: Recent Labs  Lab 12/17/18 0557  12/17/18 1128 12/18/18 0433 12/19/18 0532  WBC 8.0  --   --  5.7 6.3  HGB 10.4*   < > 9.2* 9.0* 9.0*  HCT 33.1*   < > 27.0* 28.0* 27.8*  PLT 203  --   --  209 193   < > = values in this interval not displayed.   Recent Labs  Lab 12/17/18 0557  12/18/18 0433 12/19/18 0532 12/20/18 0329  NA  --    < > 139 137 136  K  --    < > 3.4* 3.7 3.7  CL  --    < > 99 98 95*  CO2  --   --  30 30 30   BUN  --    < > 5* 10 11  CREATININE  --    < > 0.72 0.77 0.74  CALCIUM  --   --  8.8* 8.6* 8.7*  PROT 5.6*  --   --   --   --   BILITOT 0.7  --   --   --   --   ALKPHOS 138*  --   --   --   --   ALT 25  --   --   --   --   AST 22  --   --   --   --   GLUCOSE  --    < > 104* 118* 185*   < > = values in this interval not displayed.     Imaging/Diagnostic Tests: Dg Chest Port 1 View  Result Date: 12/19/2018 CLINICAL DATA:  Dyspnea EXAM: PORTABLE CHEST 1 VIEW COMPARISON:  12/17/2018 FINDINGS: Bilateral mild interstitial thickening. Small bilateral pleural effusions. No pneumothorax. Stable cardiomegaly. Prior CABG. Prior TAVR. No acute osseous abnormality. IMPRESSION: 1. Cardiomegaly with pulmonary vascular congestion and small bilateral pleural effusions. Electronically Signed   By: Kathreen Devoid   On: 12/19/2018 09:14    Lattie Haw, MD 12/20/2018, 6:26 AM PGY-1, [place] Intern pager: [phone], text pages welcome

## 2018-12-21 LAB — CBC
HCT: 31.2 % — ABNORMAL LOW (ref 39.0–52.0)
Hemoglobin: 9.9 g/dL — ABNORMAL LOW (ref 13.0–17.0)
MCH: 28.3 pg (ref 26.0–34.0)
MCHC: 31.7 g/dL (ref 30.0–36.0)
MCV: 89.1 fL (ref 80.0–100.0)
Platelets: 232 10*3/uL (ref 150–400)
RBC: 3.5 MIL/uL — ABNORMAL LOW (ref 4.22–5.81)
RDW: 15.8 % — ABNORMAL HIGH (ref 11.5–15.5)
WBC: 6.8 10*3/uL (ref 4.0–10.5)
nRBC: 0 % (ref 0.0–0.2)

## 2018-12-21 LAB — BASIC METABOLIC PANEL
Anion gap: 10 (ref 5–15)
BUN: 13 mg/dL (ref 8–23)
CO2: 32 mmol/L (ref 22–32)
Calcium: 9 mg/dL (ref 8.9–10.3)
Chloride: 95 mmol/L — ABNORMAL LOW (ref 98–111)
Creatinine, Ser: 0.7 mg/dL (ref 0.61–1.24)
GFR calc Af Amer: >60 mL/min (ref >60)
GFR calc non Af Amer: >60 mL/min (ref >60)
Glucose, Bld: 141 mg/dL — ABNORMAL HIGH (ref 70–99)
Potassium: 3.3 mmol/L — ABNORMAL LOW (ref 3.5–5.1)
Sodium: 137 mmol/L (ref 135–145)

## 2018-12-21 LAB — GLUCOSE, CAPILLARY
Glucose-Capillary: 137 mg/dL — ABNORMAL HIGH (ref 70–99)
Glucose-Capillary: 128 mg/dL — ABNORMAL HIGH (ref 70–99)
Glucose-Capillary: 157 mg/dL — ABNORMAL HIGH (ref 70–99)
Glucose-Capillary: 154 mg/dL — ABNORMAL HIGH (ref 70–99)

## 2018-12-21 MED ORDER — BENAZEPRIL HCL 5 MG PO TABS
5.0000 mg | ORAL_TABLET | Freq: Every day | ORAL | Status: DC
  Administered 2018-12-21 – 2018-12-22 (×2): 5 mg via ORAL
  Filled 2018-12-21 (×2): qty 1

## 2018-12-21 MED ORDER — TERAZOSIN HCL 1 MG PO CAPS
2.0000 mg | ORAL_CAPSULE | Freq: Every day | ORAL | Status: DC
  Filled 2018-12-21: qty 1
  Filled 2018-12-21: qty 2
  Filled 2018-12-21 (×2): qty 1
  Filled 2018-12-21: qty 2

## 2018-12-21 MED ORDER — POLYETHYLENE GLYCOL 3350 17 G PO PACK
17.0000 g | PACK | Freq: Every day | ORAL | Status: DC
  Administered 2018-12-21 – 2018-12-22 (×2): 17 g via ORAL
  Filled 2018-12-21 (×2): qty 1

## 2018-12-21 MED ORDER — LEVALBUTEROL HCL 0.63 MG/3ML IN NEBU
0.6300 mg | INHALATION_SOLUTION | Freq: Four times a day (QID) | RESPIRATORY_TRACT | Status: DC | PRN
  Administered 2018-12-21 – 2018-12-22 (×5): 0.63 mg via RESPIRATORY_TRACT
  Filled 2018-12-21 (×4): qty 3

## 2018-12-21 MED ORDER — IPRATROPIUM BROMIDE 0.02 % IN SOLN
0.5000 mg | Freq: Four times a day (QID) | RESPIRATORY_TRACT | Status: DC | PRN
  Administered 2018-12-21 – 2018-12-22 (×4): 0.5 mg via RESPIRATORY_TRACT
  Filled 2018-12-21 (×3): qty 2.5

## 2018-12-21 NOTE — Discharge Summary (Signed)
[facility] Discharge Summary  Patient name: [name] Medical record number: [id number] Date of birth: June 29, 1931 Age: 85 y.o. Gender: male Date of Admission: 12/17/2018  Date of Discharge: 12/22/18 Admitting Physician: Matilde Haymaker, MD  Primary Care Provider: Nicoletta Dress, MD Consultants: None  Indication for Hospitalization: Shortness of breath  Discharge Diagnoses/Problem List:  CHF COPD requiring 3L home oxygen HLD HTN CAD s/p CABG '94 Atrial Fib. T2DM  Disposition: SNF  Discharge Condition: Stable  Discharge Exam:   General: Alert and oriented in no apparent distress Heart: Irregular rhythm  Lungs: Some shallow breathing with use of accessory muscles but lung sounds clear. Abdomen: Bowel sounds present, no abdominal pain Skin: Warm and dry Extremities: No lower extremity edema. Left forearm cellulitis improving, without signs of further or ongoing infection.   Brief Hospital Course:  [name] is an 85 yo male who presented with decompensated heart failure on 12/17/2018. He was previously discharged from the hospital on 11/24/2018 after an admission for left arm cellulitis, decompensated heart failure, COPD exacerbation and acute PE. After his discharge he continued to have shortness of breath and reported that he became less able to move around his house due to fatigue. He was unable to lay flat at night, sleeping in his arm chair. His wife eventually called EMS because of concern for his breathing status. In the ED he was found to have an elevated BNP and a CTA that was negative for PE but positive formoderate sized bilateral pleural effusions. CXR showed cardiomegaly along with pulmonary vascular congestion along with the small pleural effusions. Patient was started on BiPAP in the ED. The patient was started on Lasix to diurese and 3.125 mg Carvedilol to help rate-control his atrial fibrillation. His home metoprolol was  discontinued during his hospital stay. Patient's weight improved from 88.5kg on admission to 85.7 on 12/21/18. He was discharged at a weight of 85.7kg. During his hospital course [name]'s respiratory effort was poor despite good response to diuretic treatment and good urine output. A repeat CXR showed smaller pleural effusions compared to before. His inflammatory markers were normal. He was treated as a non-infectious exacerbation of COPD. He was treated with Prednisone x 5 days and regular Levalbuterol and Ipratropium nebulizer. Patient did have a history of BPH and required foley after multiple in and out trials. Void trial was done on 7/20 with 364ml in bladder and patient unable to pass urine. Patient was discharged with foley in place due to results of void trial.  7/17 - Patient was accepted to [facility] with the requirement that he only need BiPAP for sleep.  Issues for Follow Up:  1. F/u BPH - need for foley  Significant Procedures: None  Significant Labs and Imaging:  Recent Labs  Lab 12/18/18 0433 12/19/18 0532 12/21/18 0802  WBC 5.7 6.3 6.8  HGB 9.0* 9.0* 9.9*  HCT 28.0* 27.8* 31.2*  PLT 209 193 232   Recent Labs  Lab 12/17/18 0557  12/18/18 0433 12/19/18 0532 12/20/18 0329 12/21/18 0802 12/22/18 0409  NA  --    < > 139 137 136 137 136  K  --    < > 3.4* 3.7 3.7 3.3* 3.8  CL  --    < > 99 98 95* 95* 95*  CO2  --   --  30 30 30  32 31  GLUCOSE  --    < > 104* 118* 185* 141* 122*  BUN  --    < >  5* 10 11 13 18   CREATININE  --    < > 0.72 0.77 0.74 0.70 0.72  CALCIUM  --   --  8.8* 8.6* 8.7* 9.0 8.6*  ALKPHOS 138*  --   --   --   --   --   --   AST 22  --   --   --   --   --   --   ALT 25  --   --   --   --   --   --   ALBUMIN 3.0*  --   --   --   --   --   --    < > = values in this interval not displayed.    Ct Angio Chest Pe W And/or Wo Contrast  Result Date: 12/17/2018 CLINICAL DATA:  Shortness of breath. EXAM: CT ANGIOGRAPHY CHEST WITH  CONTRAST TECHNIQUE: Multidetector CT imaging of the chest was performed using the standard protocol during bolus administration of intravenous contrast. Multiplanar CT image reconstructions and MIPs were obtained to evaluate the vascular anatomy. CONTRAST:  159mL OMNIPAQUE IOHEXOL 350 MG/ML SOLN COMPARISON:  CT scan of November 23, 2018. FINDINGS: Cardiovascular: Satisfactory opacification of the pulmonary arteries to the segmental level. No evidence of pulmonary embolism. Mild cardiomegaly is noted. Status post transcatheter aortic valve repair. Coronary artery calcifications are noted. Status post coronary bypass graft. Atherosclerosis of thoracic aorta is noted without aneurysm formation. No pericardial effusion. Mediastinum/Nodes: No enlarged mediastinal, hilar, or axillary lymph nodes. Thyroid gland, trachea, and esophagus demonstrate no significant findings. Lungs/Pleura: No pneumothorax is noted. Moderate size bilateral pleural effusions are noted with adjacent subsegmental atelectasis of both lower lobes. Stable mild left upper lobe subsegmental atelectasis or scarring is noted. Upper Abdomen: No acute abnormality. Musculoskeletal: No chest wall abnormality. No acute or significant osseous findings. Review of the MIP images confirms the above findings. IMPRESSION: No definite evidence of pulmonary embolus. Moderate bilateral pleural effusions are noted with adjacent subsegmental atelectasis. Status post coronary bypass grafting and transcatheter aortic valve replacement. Aortic Atherosclerosis (ICD10-I70.0).1 Electronically Signed   By: Marijo Conception M.D.   On: 12/17/2018 09:49   Ct Angio Chest Pe W Or Wo Contrast  Result Date: 11/23/2018 CLINICAL DATA:  Ongoing shortness of breath. Recent left arm surgery. EXAM: CT ANGIOGRAPHY CHEST WITH CONTRAST TECHNIQUE: Multidetector CT imaging of the chest was performed using the standard protocol during bolus administration of intravenous contrast. Multiplanar CT  image reconstructions and MIPs were obtained to evaluate the vascular anatomy. CONTRAST:  53mL OMNIPAQUE IOHEXOL 350 MG/ML SOLN COMPARISON:  08/22/2017 FINDINGS: Cardiovascular: Moderate stable cardiomegaly. Calcification of the mitral valve annulus. Interval TAVR. Calcification over the left main and 3 vessel coronary arteries. Calcified plaque over the thoracic aorta. Pulmonary arterial system is well opacified. There is subtle peripheral linear filling defect over a distal right lower lobar pulmonary artery which may be sequelae from chronic thrombus although small acute thrombus is possible. Mediastinum/Nodes: No significant mediastinal or hilar adenopathy. Few shoddy mediastinal lymph nodes are present. Remaining mediastinal structures are unremarkable. Lungs/Pleura: Lungs are adequately inflated demonstrate moderate size bilateral pleural effusions with associated compressive atelectasis in the lung bases. Linear atelectasis over the lingula. Airways are unremarkable. Upper Abdomen: Calcified plaque over the abdominal aorta. Tiny amount of perihepatic fluid is present. Musculoskeletal: Degenerative change of the spine. Stable mild T12 compression fracture. Review of the MIP images confirms the above findings. IMPRESSION: Tiny peripheral linear filling defect over a distal right  lower lobar pulmonary artery which may be due to acute thrombus versus sequelae from chronic thrombus. Moderate size bilateral pleural effusions with associated bibasilar compressive atelectasis. Linear atelectasis over the lingula. Cardiomegaly. Atherosclerotic coronary artery disease. Evidence of TAVR. Aortic Atherosclerosis (ICD10-I70.0). Stable T12 compression fracture. These results will be called to the ordering clinician or representative by the Radiologist Assistant, and communication documented in the PACS or zVision Dashboard. Electronically Signed   By: Marin Olp M.D.   On: 11/23/2018 13:01   Dg Chest Port 1  View  Result Date: 12/19/2018 CLINICAL DATA:  Dyspnea EXAM: PORTABLE CHEST 1 VIEW COMPARISON:  12/17/2018 FINDINGS: Bilateral mild interstitial thickening. Small bilateral pleural effusions. No pneumothorax. Stable cardiomegaly. Prior CABG. Prior TAVR. No acute osseous abnormality. IMPRESSION: 1. Cardiomegaly with pulmonary vascular congestion and small bilateral pleural effusions. Electronically Signed   By: Kathreen Devoid   On: 12/19/2018 09:14   Dg Chest Portable 1 View  Result Date: 12/17/2018 CLINICAL DATA:  Shortness of breath for 3 days EXAM: PORTABLE CHEST 1 VIEW COMPARISON:  11/22/2018 FINDINGS: Chronic moderate pleural effusions obscuring the lower lungs. Chronic cardiomegaly status post transcatheter aortic valve replacement. No Kerley lines or air bronchogram. No pneumothorax IMPRESSION: Cardiomegaly and moderate pleural effusions also seen on imaging in June 2020. Electronically Signed   By: Monte Fantasia M.D.   On: 12/17/2018 06:31     Results/Tests Pending at Time of Discharge: None  Discharge Medications:  Allergies as of 12/22/2018      Reactions   Bactrim [sulfamethoxazole-trimethoprim] Other (See Comments)   "does not do anything for me"   Levaquin [levofloxacin In D5w] Diarrhea      Medication List    STOP taking these medications   acetaminophen 500 MG tablet Commonly known as: TYLENOL   albuterol 90 MCG/ACT inhaler Commonly known as: PROVENTIL,VENTOLIN   ipratropium-albuterol 0.5-2.5 (3) MG/3ML Soln Commonly known as: DUONEB   metoprolol tartrate 50 MG tablet Commonly known as: LOPRESSOR   multivitamin-lutein Caps capsule     TAKE these medications   acetaminophen-codeine 300-30 MG tablet Commonly known as: TYLENOL #3 Take 1 tablet by mouth 2 (two) times daily as needed for pain.   acyclovir 800 MG tablet Commonly known as: ZOVIRAX Take 400 mg by mouth 2 (two) times daily.   allopurinol 300 MG tablet Commonly known as: ZYLOPRIM Take 300 mg by  mouth daily.   apixaban 5 MG Tabs tablet Commonly known as: ELIQUIS Take 1 tablet (5 mg total) by mouth 2 (two) times daily.   atorvastatin 80 MG tablet Commonly known as: LIPITOR Take 1 tablet (80 mg total) by mouth at bedtime.   benazepril 5 MG tablet Commonly known as: LOTENSIN Take 1 tablet (5 mg total) by mouth daily. Start taking on: December 23, 2018 What changed:   medication strength  how much to take   bisacodyl 5 MG EC tablet Commonly known as: DULCOLAX Take 1 tablet (5 mg total) by mouth daily. Start taking on: December 23, 2018   brimonidine 0.2 % ophthalmic solution Commonly known as: ALPHAGAN Place 1 drop into both eyes 2 (two) times daily.   carvedilol 3.125 MG tablet Commonly known as: COREG Take 1 tablet (3.125 mg total) by mouth 2 (two) times daily with a meal.   ferrous sulfate 325 (65 FE) MG tablet Take 325 mg by mouth daily with breakfast.   furosemide 40 MG tablet Commonly known as: LASIX Take 1 tablet (40 mg total) by mouth daily. Start taking on: December 23, 2018 What changed:   medication strength  how much to take   lansoprazole 30 MG capsule Commonly known as: PREVACID Take 30 mg by mouth daily.   levalbuterol 0.63 MG/3ML nebulizer solution Commonly known as: XOPENEX Take 3 mLs (0.63 mg total) by nebulization every 6 (six) hours as needed for wheezing or shortness of breath.   metFORMIN 500 MG 24 hr tablet Commonly known as: GLUCOPHAGE-XR Take 500 mg by mouth daily.   methocarbamol 750 MG tablet Commonly known as: ROBAXIN Take 750 mg by mouth 2 (two) times daily as needed for muscle spasms.   multivitamin with minerals Tabs tablet Take 1 tablet by mouth daily.   polyethylene glycol 17 g packet Commonly known as: MIRALAX / GLYCOLAX Take 17 g by mouth 2 (two) times daily.   predniSONE 20 MG tablet Commonly known as: DELTASONE Take 2 tablets (40 mg total) by mouth daily with breakfast for 1 day. Start taking on: December 23, 2018    Symbicort 80-4.5 MCG/ACT inhaler Generic drug: budesonide-formoterol Inhale 1 puff into the lungs as needed for shortness of breath.   tamsulosin 0.4 MG Caps capsule Commonly known as: FLOMAX Take 1 capsule (0.4 mg total) by mouth daily after supper.   terazosin 2 MG capsule Commonly known as: HYTRIN Take 1 capsule (2 mg total) by mouth at bedtime. What changed:   medication strength  how much to take   trolamine salicylate 10 % cream Commonly known as: ASPERCREME Apply 1 application topically 4 (four) times daily as needed for muscle pain.   umeclidinium bromide 62.5 MCG/INH Aepb Commonly known as: INCRUSE ELLIPTA Inhale 1 puff into the lungs daily.       Discharge Instructions: Please refer to Patient Instructions section of EMR for full details.  Patient was counseled important signs and symptoms that should prompt return to medical care, changes in medications, dietary instructions, activity restrictions, and follow up appointments.   Follow-Up Appointments: Contact information for after-discharge care    Destination    [facility] Preferred SNF .   Service: Skilled Nursing Contact information: [place] [phone]              Lurline Del, MD 12/22/2018, 4:14 PM PGY-1, [place]

## 2018-12-21 NOTE — Progress Notes (Addendum)
PT c/o SOB. [name] respiration rate goes up to 40s while talking and around 25 while resting.  LS diminished in LL. Family medicine made aware.  Received order to give prn neb tx and monitor.  Idolina Primer, RN

## 2018-12-21 NOTE — Progress Notes (Addendum)
Family Medicine Teaching Service Daily Progress Note Intern Pager: [phone]  Patient name: [name] Medical record number: [id number] Date of birth: 26-Feb-1932 Age: 85 y.o. Gender: male  Primary Care Provider: Nicoletta Dress, MD Consultants:  Code Status: Full   Pt Overview and Major Events to Date:  [name] is a 85 y.o. male presenting with decompensated heart failure. PMH is significant for HFpEF, CAD s/p CABG (1994), s/p TAVR, A-fib, COPD, recent PE (11/2018), hospitalization 6/20 for cellulitis of left forearm, iron deficiency anemia.  Assessment and Plan:  Shortness of breath 2/2 HFpEF exacerbation Feels much better today. Now on home 3L. No crackles appreciated on exam. Very mild wheezing. 2+ pitting edema on the L, 1+ on right. 1L out charted 7/19, but patient feels he has had much more output. Weight stable at 85.7kg. Cr pending. At this point patient is medically ready for placement at snf. - transition to PO lasix 40mg  bid, plan to transition to daily 40mg  on 7/20 - daily weights - strict I/O - restart home benazepril at half home dose (5mg ) - PT/OT - daily bmp  COPD exacerbation Not tachypneic, very mild wheezing on exam. On home O2 requirement. Has been getting scheduled xopenex and duonebs. Will make these prn. Receiving prednisone. Will finish 5 day course. - prednisone 40mg  (7/17-7/21) - xopenex q 6 hours prn - duonebs q 6 hours prn - continue incruse and breo  Left forearm cellulitis S/P I&D at OSH. Large incision which appears to be healing well. See pictures below. Dressing consists of Xeroform, covered by kerlix and ace-wrap. Will consult wound care for further recommendations. - continue to monitor - wound care consult placed  A. fib, rate controlled HR 89-101, holding home metoprolol, on 3.125mg  coreg. - continue coreg 3.125mg  bid - continue eliquis 5mg  bid - continue to hold metoprolol  Hypertension Takes benazepril, lasix, metoprolol,  terazosin as outpatient. D/C metoprolol and switched to coreg. Have been holding benazepril, kidney function very stable and given transition to po lasix felt to be safe to restart. Terazosin likely not indicated especially given patient's age. Will halve dose of terazosin, and restart benazepril at half of home dose. - continue coreg 3.125mg  bid - continue terazosin, decrease to 2mg  - restart benazepril at half home dose (5mg ) - continue lasix, 40mg  po bid  CAD, s/p CABG (1994) -Continue atorvastatin 80 mg daily -Monitor for chest pain  Diabetes, type II CBG 229-137. aspart 8u. - sSSI - hold metformin - ac hs cbg checks  BPH Patient very resistant to the idea of foley d/c. States he wont be able to urinate and we will just wind up putting it back in. Currently on flomax and terazosin. Will continue both medications, hold off on void trial for now. - monitor uop - continue foley for now - continue terazosin, will half dose to 2 mg - continue flomax 0.4mg  daily  FEN/GI: Heart healthy/carb modified Prophylaxis: Continue apixaban    Disposition:  pending snf placement  Subjective:  Doing well this am. Wants to get hydrogel for arm wound. Breathing much improved. Urinating "a lot".  Objective: Temp:  [97.5 F (36.4 C)-97.9 F (36.6 C)] 97.5 F (36.4 C) (07/19 0538) Pulse Rate:  [89-101] 101 (07/19 0538) Resp:  [19-33] 27 (07/19 0538) BP: (120-148)/(57-84) 134/64 (07/19 0538) SpO2:  [95 %-100 %] 95 % (07/19 0538) Weight:  [85.7 kg] 85.7 kg (07/19 0538)  General: 85 year old caucasian male, no acute distress Cardio: rrr, no m/r/g. 2+  pitting edema LLE, 1+ RLE. Warm and dry. Palpable radial pulse bialterally Pulm: minimal inspiratory wheezing appreciated bilateral upper lobes. No accessory muscle use. No crackles appreciated. Abdomen: Bowel sounds normal. Abdomen soft and non-tender.  Neuro: no focal neurological deficits. Left forearm: Large incision, well healing with good  vascular base. Mucous-like material staining xeroform with very minimal blood noted.       Laboratory: Recent Labs  Lab 12/17/18 0557  12/17/18 1128 12/18/18 0433 12/19/18 0532  WBC 8.0  --   --  5.7 6.3  HGB 10.4*   < > 9.2* 9.0* 9.0*  HCT 33.1*   < > 27.0* 28.0* 27.8*  PLT 203  --   --  209 193   < > = values in this interval not displayed.   Recent Labs  Lab 12/17/18 0557  12/18/18 0433 12/19/18 0532 12/20/18 0329  NA  --    < > 139 137 136  K  --    < > 3.4* 3.7 3.7  CL  --    < > 99 98 95*  CO2  --   --  30 30 30   BUN  --    < > 5* 10 11  CREATININE  --    < > 0.72 0.77 0.74  CALCIUM  --   --  8.8* 8.6* 8.7*  PROT 5.6*  --   --   --   --   BILITOT 0.7  --   --   --   --   ALKPHOS 138*  --   --   --   --   ALT 25  --   --   --   --   AST 22  --   --   --   --   GLUCOSE  --    < > 104* 118* 185*   < > = values in this interval not displayed.     Imaging/Diagnostic Tests: Dg Chest Port 1 View  Result Date: 12/19/2018 CLINICAL DATA:  Dyspnea EXAM: PORTABLE CHEST 1 VIEW COMPARISON:  12/17/2018 FINDINGS: Bilateral mild interstitial thickening. Small bilateral pleural effusions. No pneumothorax. Stable cardiomegaly. Prior CABG. Prior TAVR. No acute osseous abnormality. IMPRESSION: 1. Cardiomegaly with pulmonary vascular congestion and small bilateral pleural effusions. Electronically Signed   By: Kathreen Devoid   On: 12/19/2018 09:14    Guadalupe Dawn, MD 12/21/2018, 7:12 AM PGY-3, [place] Intern pager: [phone], text pages welcome

## 2018-12-22 DIAGNOSIS — M2550 Pain in unspecified joint: Secondary | ICD-10-CM | POA: Diagnosis not present

## 2018-12-22 DIAGNOSIS — K219 Gastro-esophageal reflux disease without esophagitis: Secondary | ICD-10-CM | POA: Diagnosis present

## 2018-12-22 DIAGNOSIS — Z7901 Long term (current) use of anticoagulants: Secondary | ICD-10-CM | POA: Diagnosis not present

## 2018-12-22 DIAGNOSIS — I110 Hypertensive heart disease with heart failure: Secondary | ICD-10-CM | POA: Diagnosis present

## 2018-12-22 DIAGNOSIS — G8929 Other chronic pain: Secondary | ICD-10-CM | POA: Diagnosis present

## 2018-12-22 DIAGNOSIS — I361 Nonrheumatic tricuspid (valve) insufficiency: Secondary | ICD-10-CM | POA: Diagnosis not present

## 2018-12-22 DIAGNOSIS — I25119 Atherosclerotic heart disease of native coronary artery with unspecified angina pectoris: Secondary | ICD-10-CM | POA: Diagnosis not present

## 2018-12-22 DIAGNOSIS — E119 Type 2 diabetes mellitus without complications: Secondary | ICD-10-CM | POA: Diagnosis not present

## 2018-12-22 DIAGNOSIS — J449 Chronic obstructive pulmonary disease, unspecified: Secondary | ICD-10-CM | POA: Diagnosis not present

## 2018-12-22 DIAGNOSIS — I517 Cardiomegaly: Secondary | ICD-10-CM | POA: Diagnosis not present

## 2018-12-22 DIAGNOSIS — F329 Major depressive disorder, single episode, unspecified: Secondary | ICD-10-CM | POA: Diagnosis present

## 2018-12-22 DIAGNOSIS — R0602 Shortness of breath: Secondary | ICD-10-CM | POA: Diagnosis not present

## 2018-12-22 DIAGNOSIS — I482 Chronic atrial fibrillation, unspecified: Secondary | ICD-10-CM | POA: Diagnosis not present

## 2018-12-22 DIAGNOSIS — J9690 Respiratory failure, unspecified, unspecified whether with hypoxia or hypercapnia: Secondary | ICD-10-CM | POA: Diagnosis not present

## 2018-12-22 DIAGNOSIS — Z9889 Other specified postprocedural states: Secondary | ICD-10-CM | POA: Diagnosis not present

## 2018-12-22 DIAGNOSIS — J80 Acute respiratory distress syndrome: Secondary | ICD-10-CM | POA: Diagnosis not present

## 2018-12-22 DIAGNOSIS — Z20828 Contact with and (suspected) exposure to other viral communicable diseases: Secondary | ICD-10-CM | POA: Diagnosis present

## 2018-12-22 DIAGNOSIS — I083 Combined rheumatic disorders of mitral, aortic and tricuspid valves: Secondary | ICD-10-CM | POA: Diagnosis present

## 2018-12-22 DIAGNOSIS — R52 Pain, unspecified: Secondary | ICD-10-CM | POA: Diagnosis not present

## 2018-12-22 DIAGNOSIS — I5023 Acute on chronic systolic (congestive) heart failure: Secondary | ICD-10-CM | POA: Diagnosis not present

## 2018-12-22 DIAGNOSIS — Z8249 Family history of ischemic heart disease and other diseases of the circulatory system: Secondary | ICD-10-CM | POA: Diagnosis not present

## 2018-12-22 DIAGNOSIS — I25810 Atherosclerosis of coronary artery bypass graft(s) without angina pectoris: Secondary | ICD-10-CM | POA: Diagnosis not present

## 2018-12-22 DIAGNOSIS — G4730 Sleep apnea, unspecified: Secondary | ICD-10-CM | POA: Diagnosis present

## 2018-12-22 DIAGNOSIS — J42 Unspecified chronic bronchitis: Secondary | ICD-10-CM | POA: Diagnosis not present

## 2018-12-22 DIAGNOSIS — E785 Hyperlipidemia, unspecified: Secondary | ICD-10-CM | POA: Diagnosis present

## 2018-12-22 DIAGNOSIS — J918 Pleural effusion in other conditions classified elsewhere: Secondary | ICD-10-CM | POA: Diagnosis present

## 2018-12-22 DIAGNOSIS — L89159 Pressure ulcer of sacral region, unspecified stage: Secondary | ICD-10-CM | POA: Diagnosis not present

## 2018-12-22 DIAGNOSIS — R2681 Unsteadiness on feet: Secondary | ICD-10-CM | POA: Diagnosis not present

## 2018-12-22 DIAGNOSIS — M542 Cervicalgia: Secondary | ICD-10-CM | POA: Diagnosis not present

## 2018-12-22 DIAGNOSIS — D649 Anemia, unspecified: Secondary | ICD-10-CM | POA: Diagnosis present

## 2018-12-22 DIAGNOSIS — J90 Pleural effusion, not elsewhere classified: Secondary | ICD-10-CM | POA: Diagnosis not present

## 2018-12-22 DIAGNOSIS — E1165 Type 2 diabetes mellitus with hyperglycemia: Secondary | ICD-10-CM | POA: Diagnosis not present

## 2018-12-22 DIAGNOSIS — K5900 Constipation, unspecified: Secondary | ICD-10-CM | POA: Diagnosis not present

## 2018-12-22 DIAGNOSIS — M109 Gout, unspecified: Secondary | ICD-10-CM | POA: Diagnosis not present

## 2018-12-22 DIAGNOSIS — R278 Other lack of coordination: Secondary | ICD-10-CM | POA: Diagnosis not present

## 2018-12-22 DIAGNOSIS — I5030 Unspecified diastolic (congestive) heart failure: Secondary | ICD-10-CM | POA: Diagnosis not present

## 2018-12-22 DIAGNOSIS — Z87891 Personal history of nicotine dependence: Secondary | ICD-10-CM | POA: Diagnosis not present

## 2018-12-22 DIAGNOSIS — I4891 Unspecified atrial fibrillation: Secondary | ICD-10-CM | POA: Diagnosis not present

## 2018-12-22 DIAGNOSIS — I5033 Acute on chronic diastolic (congestive) heart failure: Secondary | ICD-10-CM | POA: Diagnosis not present

## 2018-12-22 DIAGNOSIS — N400 Enlarged prostate without lower urinary tract symptoms: Secondary | ICD-10-CM | POA: Diagnosis present

## 2018-12-22 DIAGNOSIS — M546 Pain in thoracic spine: Secondary | ICD-10-CM | POA: Diagnosis not present

## 2018-12-22 DIAGNOSIS — R0603 Acute respiratory distress: Secondary | ICD-10-CM | POA: Diagnosis not present

## 2018-12-22 DIAGNOSIS — R0902 Hypoxemia: Secondary | ICD-10-CM | POA: Diagnosis not present

## 2018-12-22 DIAGNOSIS — I2510 Atherosclerotic heart disease of native coronary artery without angina pectoris: Secondary | ICD-10-CM | POA: Diagnosis present

## 2018-12-22 DIAGNOSIS — I447 Left bundle-branch block, unspecified: Secondary | ICD-10-CM | POA: Diagnosis present

## 2018-12-22 DIAGNOSIS — I4819 Other persistent atrial fibrillation: Secondary | ICD-10-CM | POA: Diagnosis not present

## 2018-12-22 DIAGNOSIS — R339 Retention of urine, unspecified: Secondary | ICD-10-CM | POA: Diagnosis not present

## 2018-12-22 DIAGNOSIS — I10 Essential (primary) hypertension: Secondary | ICD-10-CM | POA: Diagnosis not present

## 2018-12-22 DIAGNOSIS — Z952 Presence of prosthetic heart valve: Secondary | ICD-10-CM | POA: Diagnosis not present

## 2018-12-22 DIAGNOSIS — J9601 Acute respiratory failure with hypoxia: Secondary | ICD-10-CM | POA: Diagnosis not present

## 2018-12-22 DIAGNOSIS — I248 Other forms of acute ischemic heart disease: Secondary | ICD-10-CM | POA: Diagnosis present

## 2018-12-22 DIAGNOSIS — I4811 Longstanding persistent atrial fibrillation: Secondary | ICD-10-CM | POA: Diagnosis not present

## 2018-12-22 DIAGNOSIS — Z960 Presence of urogenital implants: Secondary | ICD-10-CM | POA: Diagnosis not present

## 2018-12-22 DIAGNOSIS — R0601 Orthopnea: Secondary | ICD-10-CM | POA: Diagnosis not present

## 2018-12-22 DIAGNOSIS — R0600 Dyspnea, unspecified: Secondary | ICD-10-CM | POA: Diagnosis not present

## 2018-12-22 DIAGNOSIS — I2699 Other pulmonary embolism without acute cor pulmonale: Secondary | ICD-10-CM | POA: Diagnosis not present

## 2018-12-22 DIAGNOSIS — I509 Heart failure, unspecified: Secondary | ICD-10-CM | POA: Diagnosis not present

## 2018-12-22 DIAGNOSIS — I499 Cardiac arrhythmia, unspecified: Secondary | ICD-10-CM | POA: Diagnosis not present

## 2018-12-22 DIAGNOSIS — M6281 Muscle weakness (generalized): Secondary | ICD-10-CM | POA: Diagnosis not present

## 2018-12-22 DIAGNOSIS — R062 Wheezing: Secondary | ICD-10-CM | POA: Diagnosis not present

## 2018-12-22 DIAGNOSIS — I342 Nonrheumatic mitral (valve) stenosis: Secondary | ICD-10-CM | POA: Diagnosis not present

## 2018-12-22 DIAGNOSIS — J441 Chronic obstructive pulmonary disease with (acute) exacerbation: Secondary | ICD-10-CM | POA: Diagnosis present

## 2018-12-22 DIAGNOSIS — R338 Other retention of urine: Secondary | ICD-10-CM | POA: Diagnosis not present

## 2018-12-22 DIAGNOSIS — E1151 Type 2 diabetes mellitus with diabetic peripheral angiopathy without gangrene: Secondary | ICD-10-CM | POA: Diagnosis present

## 2018-12-22 DIAGNOSIS — I252 Old myocardial infarction: Secondary | ICD-10-CM | POA: Diagnosis not present

## 2018-12-22 DIAGNOSIS — J9621 Acute and chronic respiratory failure with hypoxia: Secondary | ICD-10-CM | POA: Diagnosis present

## 2018-12-22 DIAGNOSIS — M549 Dorsalgia, unspecified: Secondary | ICD-10-CM | POA: Diagnosis present

## 2018-12-22 DIAGNOSIS — Z7401 Bed confinement status: Secondary | ICD-10-CM | POA: Diagnosis not present

## 2018-12-22 DIAGNOSIS — Z9981 Dependence on supplemental oxygen: Secondary | ICD-10-CM | POA: Diagnosis not present

## 2018-12-22 DIAGNOSIS — M62838 Other muscle spasm: Secondary | ICD-10-CM | POA: Diagnosis not present

## 2018-12-22 DIAGNOSIS — I340 Nonrheumatic mitral (valve) insufficiency: Secondary | ICD-10-CM | POA: Diagnosis not present

## 2018-12-22 DIAGNOSIS — F419 Anxiety disorder, unspecified: Secondary | ICD-10-CM | POA: Diagnosis present

## 2018-12-22 LAB — GLUCOSE, CAPILLARY
Glucose-Capillary: 122 mg/dL — ABNORMAL HIGH (ref 70–99)
Glucose-Capillary: 145 mg/dL — ABNORMAL HIGH (ref 70–99)
Glucose-Capillary: 192 mg/dL — ABNORMAL HIGH (ref 70–99)

## 2018-12-22 LAB — BASIC METABOLIC PANEL
Anion gap: 10 (ref 5–15)
BUN: 18 mg/dL (ref 8–23)
CO2: 31 mmol/L (ref 22–32)
Calcium: 8.6 mg/dL — ABNORMAL LOW (ref 8.9–10.3)
Chloride: 95 mmol/L — ABNORMAL LOW (ref 98–111)
Creatinine, Ser: 0.72 mg/dL (ref 0.61–1.24)
GFR calc Af Amer: >60 mL/min (ref >60)
GFR calc non Af Amer: >60 mL/min (ref >60)
Glucose, Bld: 122 mg/dL — ABNORMAL HIGH (ref 70–99)
Potassium: 3.8 mmol/L (ref 3.5–5.1)
Sodium: 136 mmol/L (ref 135–145)

## 2018-12-22 LAB — SARS CORONAVIRUS 2 BY RT PCR (HOSPITAL ORDER, PERFORMED IN ~~LOC~~ HOSPITAL LAB): SARS Coronavirus 2: NEGATIVE

## 2018-12-22 MED ORDER — BISACODYL 5 MG PO TBEC
5.0000 mg | DELAYED_RELEASE_TABLET | Freq: Every day | ORAL | Status: DC
  Administered 2018-12-22: 5 mg via ORAL
  Filled 2018-12-22: qty 1

## 2018-12-22 MED ORDER — TAMSULOSIN HCL 0.4 MG PO CAPS: 0.4000 mg | ORAL_CAPSULE | Freq: Every day | ORAL | Status: AC

## 2018-12-22 MED ORDER — CARVEDILOL 3.125 MG PO TABS: 3.1250 mg | ORAL_TABLET | Freq: Two times a day (BID) | ORAL | 0 refills | Status: DC

## 2018-12-22 MED ORDER — BISACODYL 5 MG PO TBEC: 5.0000 mg | DELAYED_RELEASE_TABLET | Freq: Every day | ORAL | 0 refills | Status: DC

## 2018-12-22 MED ORDER — LEVALBUTEROL HCL 0.63 MG/3ML IN NEBU: 0.6300 mg | INHALATION_SOLUTION | Freq: Four times a day (QID) | RESPIRATORY_TRACT | 12 refills | Status: AC | PRN

## 2018-12-22 MED ORDER — APIXABAN 5 MG PO TABS: 5.0000 mg | ORAL_TABLET | Freq: Two times a day (BID) | ORAL | Status: AC

## 2018-12-22 MED ORDER — POLYETHYLENE GLYCOL 3350 17 G PO PACK: 17.0000 g | PACK | Freq: Two times a day (BID) | ORAL | 0 refills | Status: AC

## 2018-12-22 MED ORDER — FUROSEMIDE 40 MG PO TABS: 40.0000 mg | ORAL_TABLET | Freq: Every day | ORAL | 0 refills | Status: DC

## 2018-12-22 MED ORDER — POLYETHYLENE GLYCOL 3350 17 G PO PACK: 17.0000 g | PACK | Freq: Two times a day (BID) | ORAL | Status: DC

## 2018-12-22 MED ORDER — BENAZEPRIL HCL 5 MG PO TABS: 5.0000 mg | ORAL_TABLET | Freq: Every day | ORAL | 0 refills | Status: DC

## 2018-12-22 MED ORDER — TERAZOSIN HCL 2 MG PO CAPS: 2.0000 mg | ORAL_CAPSULE | Freq: Every day | ORAL | 0 refills | Status: DC

## 2018-12-22 MED ORDER — PREDNISONE 20 MG PO TABS: 40.0000 mg | ORAL_TABLET | Freq: Every day | ORAL | 0 refills | Status: AC

## 2018-12-22 NOTE — Progress Notes (Signed)
Occupational Therapy Treatment Patient Details Name: [name] MRN: [id number] DOB: 06-26-1931 Today's Date: 12/22/2018    History of present illness Pt is a 85 y/o male admitted with decompensated heart failure with acute hypoxic respiratory failure with preserved ejection fraction (EF 50-55% 11/24/2018). PMH is significant for HFpEF, CAD s/p CABG (1994), s/p TAVR, A-fib, COPD on home O2, recent PE (11/2018), DM, depression, anxiety, HTN, HLD, hospitalization 6/20 for cellulitis of left forearm.   OT comments  Pt making good progress with functional goals. Pt ambulated to doorway with 2 trials and transferred to recliner with min guard A. Pt;s O2 increase from 3L  to 4L during mobility due to dropping into 70s upon initially ambulating from EOB with RW. Pt declined transfer to [name]. Pt jovial, pleasant and cooperative and OT will continue to follow acutely  Follow Up Recommendations  SNF;Supervision/Assistance - 24 hour    Equipment Recommendations  None recommended by OT    Recommendations for Other Services      Precautions / Restrictions Precautions Precautions: Fall Restrictions Weight Bearing Restrictions: No Other Position/Activity Restrictions: L forearm with dressing       Mobility Bed Mobility Overal bed mobility: Needs Assistance Bed Mobility: Supine to Sit;Sit to Supine     Supine to sit: Supervision        Transfers Overall transfer level: Needs assistance Equipment used: Rolling walker (2 wheeled) Transfers: Sit to/from Stand Sit to Stand: Min guard;From elevated surface Stand pivot transfers: Min guard       General transfer comment: pt initially ambulated 2 feet from bed with RW. Pt took rest break and then ambulated approximately 10 ft to room door before sitting in recliner    Balance Overall balance assessment: Needs assistance Sitting-balance support: Feet supported Sitting balance-Leahy Scale: Good Sitting balance - Comments: able to sit  EOB without UE support   Standing balance support: Single extremity supported;During functional activity Standing balance-Leahy Scale: Poor                             ADL either performed or assessed with clinical judgement   ADL Overall ADL's : Needs assistance/impaired Eating/Feeding: Modified independent;Sitting   Grooming: Independent;Wash/dry hands;Wash/dry face;Standing       Lower Body Bathing: Minimal assistance;Sit to/from stand Lower Body Bathing Details (indicate cue type and reason): simulated, pt fatigues, fear of falling     Lower Body Dressing: Minimal assistance;Sit to/from stand Lower Body Dressing Details (indicate cue type and reason): simulated, pt fatigues, fear of falling Toilet Transfer: Min guard;Ambulation;RW;Comfort height toilet Toilet Transfer Details (indicate cue type and reason): simulated transfer with sit<>stand from EOB with in room mobility and return to [name] and Hygiene: Min guard;Sit to/from stand       Functional mobility during ADLs: Min guard;Rolling walker       Vision Baseline Vision/History: Wears glasses Wears Glasses: At all times Patient Visual Report: No change from baseline     Perception     Praxis      Cognition   Behavior During Therapy: [facility] for tasks assessed/performed Overall Cognitive Status: Within Functional Limits for tasks assessed                                          Exercises     Shoulder Instructions  General Comments      Pertinent Vitals/ Pain       Pain Assessment: 0-10 Pain Score: 2  Pain Location: L forearm Pain Descriptors / Indicators: Sore Pain Intervention(s): Monitored during session  Home Living                                          Prior Functioning/Environment              Frequency  Min 2X/week        Progress Toward Goals  OT Goals(current goals can now be found in the  care plan section)        Plan Discharge plan remains appropriate    Co-evaluation    PT/OT/SLP Co-Evaluation/Treatment: Yes Reason for Co-Treatment: For patient/therapist safety;To address functional/ADL transfers   OT goals addressed during session: ADL's and self-care;Proper use of Adaptive equipment and DME;Other (comment)(pt's fatigue, dizziness)      AM-PAC OT "6 Clicks" Daily Activity     Outcome Measure   Help from another person eating meals?: None Help from another person taking care of personal grooming?: A Little Help from another person toileting, which includes using toliet, bedpan, or urinal?: A Little Help from another person bathing (including washing, rinsing, drying)?: A Little Help from another person to put on and taking off regular upper body clothing?: A Little Help from another person to put on and taking off regular lower body clothing?: A Little 6 Click Score: 19    End of Session Equipment Utilized During Treatment: Gait belt;Rolling walker  OT Visit Diagnosis: Unsteadiness on feet (R26.81);Muscle weakness (generalized) (M62.81);Pain Pain - Right/Left: Left Pain - part of body: Arm   Activity Tolerance Patient tolerated treatment well   Patient Left with call bell/phone within reach;in chair   Nurse Communication          Time: 5974-1638 OT Time Calculation (min): 30 min  Charges: OT General Charges $OT Visit: 1 Visit OT Treatments $Self Care/Home Management : 8-22 mins     [name] 12/22/2018, 1:14 PM

## 2018-12-22 NOTE — Progress Notes (Signed)
Physical Therapy Treatment Patient Details Name: [name] MRN: [id number] DOB: May 15, 1932 Today's Date: 12/22/2018    History of Present Illness Pt is a 85 y/o male admitted with decompensated heart failure with acute hypoxic respiratory failure with preserved ejection fraction (EF 50-55% 11/24/2018). PMH is significant for HFpEF, CAD s/p CABG (1994), s/p TAVR, A-fib, COPD on home O2, recent PE (11/2018), DM, depression, anxiety, HTN, HLD, hospitalization 6/20 for cellulitis of left forearm.    PT Comments    Pt willing to work with therapies today, however states "I can't go far. I get dizzy." Pt limited in safe mobility by oxygen desaturation (see General Comments) and decreased strength and endurance. Pt currently supervision for bed mobility and min guard for transfers and ambulation with RW. D/c plans remain appropriate at this time. PT will continue to follow acutely.     Follow Up Recommendations  SNF;Supervision/Assistance - 24 hour     Equipment Recommendations  None recommended by PT       Precautions / Restrictions Precautions Precautions: Fall Restrictions Weight Bearing Restrictions: No Other Position/Activity Restrictions: L forearm with dressing    Mobility  Bed Mobility Overal bed mobility: Needs Assistance Bed Mobility: Supine to Sit;Sit to Supine     Supine to sit: Supervision        Transfers Overall transfer level: Needs assistance Equipment used: Rolling walker (2 wheeled) Transfers: Sit to/from Stand Sit to Stand: Min guard;From elevated surface Stand pivot transfers: Min guard       General transfer comment: min guard for safety   Ambulation/Gait Ambulation/Gait assistance: Min guard Gait Distance (Feet): 12 Feet(1x2', 1x10) Assistive device: Rolling walker (2 wheeled) Gait Pattern/deviations: Shuffle;Decreased stride length;Step-through pattern Gait velocity: decreased Gait velocity interpretation: <1.31 ft/sec, indicative of  household ambulator General Gait Details: min guard for safety, pt only able to ambulate 2 feet prior to oxygen desaturation, increased O2 level and pt able to ambulate 10 feet before fatiguing able to maintain SaO2 througout         Balance Overall balance assessment: Needs assistance Sitting-balance support: Feet supported Sitting balance-Leahy Scale: Good Sitting balance - Comments: able to sit EOB without UE support   Standing balance support: Single extremity supported;During functional activity Standing balance-Leahy Scale: Poor                              Cognition   Behavior During Therapy: WFL for tasks assessed/performed Overall Cognitive Status: Within Functional Limits for tasks assessed                                           General Comments General comments (skin integrity, edema, etc.): Pt on 3L O2 via [place] and able to perform bed mobility and transfer while maintaining SaO2 >90%O2 with ambulation of 2 feet SaO2 dropped to 76%O2, pt sat down supplemental O2 increased to 4L and pt rebounded to 92%O2, for second bout of ambulation supplemental O2 increased to 6L and pt was able to ambulate 10 feet, no c/o dizziness or light headedness, SaO2>95%O2, stopped due to LE fatigue       Pertinent Vitals/Pain Pain Assessment: 0-10 Pain Score: 2  Pain Location: L forearm Pain Descriptors / Indicators: Sore Pain Intervention(s): Monitored during session;Repositioned           PT Goals (current goals can now  be found in the care plan section) Acute Rehab PT Goals PT Goal Formulation: With patient Time For Goal Achievement: 01/01/19 Potential to Achieve Goals: Fair Progress towards PT goals: Progressing toward goals    Frequency    Min 3X/week      PT Plan Current plan remains appropriate    Co-evaluation   Reason for Co-Treatment: For patient/therapist safety;To address functional/ADL transfers   OT goals addressed during  session: ADL's and self-care;Proper use of Adaptive equipment and DME;Other (comment)(pt's fatigue, dizziness)      AM-PAC PT "6 Clicks" Mobility   Outcome Measure  Help needed turning from your back to your side while in a flat bed without using bedrails?: None Help needed moving from lying on your back to sitting on the side of a flat bed without using bedrails?: A Little Help needed moving to and from a bed to a chair (including a wheelchair)?: A Little Help needed standing up from a chair using your arms (e.g., wheelchair or bedside chair)?: A Little Help needed to walk in hospital room?: A Little Help needed climbing 3-5 steps with a railing? : A Lot 6 Click Score: 18    End of Session Equipment Utilized During Treatment: Gait belt;Oxygen Activity Tolerance: Patient limited by fatigue Patient left: in chair;with call bell/phone within reach;with chair alarm set Nurse Communication: Mobility status PT Visit Diagnosis: Unsteadiness on feet (R26.81);Muscle weakness (generalized) (M62.81);Other abnormalities of gait and mobility (R26.89) Pain - Right/Left: Left Pain - part of body: Arm     Time: 7014-1030 PT Time Calculation (min) (ACUTE ONLY): 30 min  Charges:  $Gait Training: 8-22 mins                     Kaliopi Blyden B. Migdalia Dk PT, DPT Acute Rehabilitation Services Pager [phone] Office [phone]    [place] 12/22/2018, 2:10 PM

## 2018-12-22 NOTE — Discharge Instructions (Signed)
Discharge instructions: Discharge summary:  - Please follow up with your PCP within one week for recheck, including new medications such as your blood pressure medicine - Please return if you experience similar symptoms, high fevers, confusion, chest pain that does not subside within 15 minutes or shortness of breath.  Shortness of Breath, Adult Shortness of breath is when a person has trouble breathing enough air or when a person feels like she or he is having trouble breathing in enough air. Shortness of breath could be a sign of a medical problem. Follow these instructions at home:   Pay attention to any changes in your symptoms.  Do not use any products that contain nicotine or tobacco, such as cigarettes, e-cigarettes, and chewing tobacco.  Do not smoke. Smoking is a common cause of shortness of breath. If you need help quitting, ask your health care provider.  Avoid things that can irritate your airways, such as: ? Mold. ? Dust. ? Air pollution. ? Chemical fumes. ? Things that can cause allergy symptoms (allergens), if you have allergies.  Keep your living space clean and free of mold and dust.  Rest as needed. Slowly return to your usual activities.  Take over-the-counter and prescription medicines only as told by your health care provider. This includes oxygen therapy and inhaled medicines.  Keep all follow-up visits as told by your health care provider. This is important. Contact a health care provider if:  Your condition does not improve as soon as expected.  You have a hard time doing your normal activities, even after you rest.  You have new symptoms. Get help right away if:  Your shortness of breath gets worse.  You have shortness of breath when you are resting.  You feel light-headed or you faint.  You have a cough that is not controlled with medicines.  You cough up blood.  You have pain with breathing.  You have pain in your chest, arms, shoulders,  or abdomen.  You have a fever.  You cannot walk up stairs or exercise the way that you normally do. These symptoms may represent a serious problem that is an emergency. Do not wait to see if the symptoms will go away. Get medical help right away. Call your local emergency services (911 in the [place] Do not drive yourself to the hospital. Summary  Shortness of breath is when a person has trouble breathing enough air. It can be a sign of a medical problem.  Avoid things that irritate your lungs, such as smoking, pollution, mold, and dust.  Pay attention to changes in your symptoms and contact your health care provider if you have a hard time completing daily activities because of shortness of breath. This information is not intended to replace advice given to you by your health care provider. Make sure you discuss any questions you have with your health care provider. Document Released: 02/13/2001 Document Revised: 10/21/2017 Document Reviewed: 10/21/2017 Elsevier Patient Education  2020 [place] on my medicine - ELIQUIS (apixaban)  Why was Eliquis prescribed for you? Eliquis was prescribed for you to reduce the risk of forming blood clots that can cause a stroke if you have a medical condition called atrial fibrillation (a type of irregular heartbeat) OR to reduce the risk of a blood clots forming after orthopedic surgery.  What do You need to know about Eliquis ? Take your Eliquis TWICE DAILY - one tablet in the morning and one tablet in the evening with  or without food.  It would be best to take the doses about the same time each day.  If you have difficulty swallowing the tablet whole please discuss with your pharmacist how to take the medication safely.  Take Eliquis exactly as prescribed by your doctor and DO NOT stop taking Eliquis without talking to the doctor who prescribed the medication.  Stopping may increase your risk of developing a new clot or  stroke.  Refill your prescription before you run out.  After discharge, you should have regular check-up appointments with your healthcare provider that is prescribing your Eliquis.  In the future your dose may need to be changed if your kidney function or weight changes by a significant amount or as you get older.  What do you do if you miss a dose? If you miss a dose, take it as soon as you remember on the same day and resume taking twice daily.  Do not take more than one dose of ELIQUIS at the same time.  Important Safety Information A possible side effect of Eliquis is bleeding. You should call your healthcare provider right away if you experience any of the following: ? Bleeding from an injury or your nose that does not stop. ? Unusual colored urine (red or dark brown) or unusual colored stools (red or black). ? Unusual bruising for unknown reasons. ? A serious fall or if you hit your head (even if there is no bleeding).  Some medicines may interact with Eliquis and might increase your risk of bleeding or clotting while on Eliquis. To help avoid this, consult your healthcare provider or pharmacist prior to using any new prescription or non-prescription medications, including herbals, vitamins, non-steroidal anti-inflammatory drugs (NSAIDs) and supplements.  This website has more information on Eliquis (apixaban): www.D[employer]aiS[employer].no.

## 2018-12-22 NOTE — Progress Notes (Signed)
D/c with [facility]. IV removed.

## 2018-12-22 NOTE — TOC Transition Note (Addendum)
Transition of Care [facility]) - CM/SW Discharge Note   Patient Details  Name: [name] MRN: [id number] Date of Birth: 05-16-1932  [facility] of [facility]) CM/SW Contact:  Eileen Stanford, LCSW Phone Number: 12/22/2018, 4:28 PM   Clinical Narrative:   Clinical Social Worker facilitated patient discharge including contacting patient family and facility to confirm patient discharge plans.  Clinical information faxed to facility and family agreeable with plan.  CSW arranged ambulance transport via [facility] to [place] room 209.  RN to call [phone] for report prior to discharge.   Final next level of care: Skilled Nursing Facility Barriers to Discharge: Continued Medical Work up   Patient Goals and CMS Choice   CMS Medicare.gov Compare Post Acute Care list provided to:: Patient Represenative (must comment)(received verbal permission-spoke with wife via phone) Choice offered to / list presented to : Spouse  Discharge Placement              Patient chooses bed at: [facility], [place] Patient to be transferred to facility by: [place] Name of family member notified: [name], spouse Patient and family notified of of transfer: 12/22/18  Discharge Plan and Services In-house Referral: NA Discharge Planning Services: CM Consult Post Acute Care Choice: [place]                               Social Determinants of Health (SDOH) Interventions     Readmission Risk Interventions No flowsheet data found.

## 2018-12-22 NOTE — Consult Note (Signed)
[facility] Nurse wound consult note Reason for Consult: Left forearm injury after fall at home.  Has been treating with Hydrogel three times weekly and wound has shown impressive healing. Will continue this.  Wound type:trauma Pressure Injury POA: NA Measurement: 13 cm linear wound that became infected and had to I & D.  Is now healing by secondary intention Wound KPQ:AESL pink Drainage (amount, consistency, odor) minimal serosanguinous Periwound:scarring from helaing/new epithelium Dressing procedure/placement/frequency:Cleanse wound to left forearm with NS and pat dry  Apply Hydrogel to wound bed.  Cover with dry gauze, kerlix and tape. Change Monday Wed Friday Will not follow at this time.  Please re-consult if needed.  Domenic Moras MSN, RN, FNP-BC CWON Wound, Ostomy, Continence Nurse Pager [phone]

## 2018-12-22 NOTE — Progress Notes (Addendum)
Family Medicine Teaching Service Daily Progress Note Intern Pager: [phone]  Patient name: [name] Medical record number: [id number] Date of birth: 10-11-1931 Age: 85 y.o. Gender: male  Primary Care Provider: Nicoletta Dress, MD Consultants:  Code Status: Full   Pt Overview and Major Events to Date:  [name] is a 85 y.o. male presenting with decompensated heart failure. PMH is significant for HFpEF, CAD s/p CABG (1994), s/p TAVR, A-fib, COPD, recent PE (11/2018), hospitalization 6/20 for cellulitis of left forearm, iron deficiency anemia.  Assessment and Plan:  Shortness of breath 2/2 HFpEF exacerbation Feels much better today. Now on home 3L. No crackles appreciated on exam. Very mild wheezing. 2+ pitting edema on the L, 1+ on right. 1L out charted 7/19, but patient feels he has had much more output. Weight stable at 85.7kg. Cr pending. At this point patient is medically ready for placement at snf. - transition to PO lasix 40mg  bid, plan to transition to daily 40mg  on 7/20 - daily weights - strict I/O - PT/OT - daily bmp  COPD exacerbation On home O2 requirement via nasal cannula. Has been getting scheduled xopenex and duonebs. Will make these prn. Receiving prednisone. Will finish 5 day course. - prednisone 40mg  (7/17-7/21) - xopenex q 6 hours prn - duonebs q 6 hours prn - continue incruse and breo  Left forearm cellulitis S/P I&D at OSH. Large incision which appears to be healing well. See pictures below. Dressing consists of Xeroform, covered by kerlix and ace-wrap. Will consult wound care for further recommendations. - continue to monitor - wound care consult placed  A. fib, rate controlled HR mid-90s to low 100s, holding home metoprolol, on 3.125mg  coreg. - continue coreg 3.125mg  bid - continue eliquis 5mg  bid - continue to hold metoprolol  Hypertension Takes benazepril, lasix, metoprolol, terazosin as outpatient. D/C metoprolol and switched to coreg.  Have been holding benazepril, kidney function very stable and given transition to po lasix felt to be safe to restart. Terazosin likely not indicated especially given patient's age. Will halve dose of terazosin, and restart benazepril at half of home dose. - continue coreg 3.125mg  bid - continue terazosin, decrease to 2mg  - restart benazepril at half home dose (5mg ) - continue lasix, 40mg  po bid  CAD, s/p CABG (1994) -Continue atorvastatin 80 mg daily -Monitor for chest pain  Diabetes, type II CBG 122-157 overnight - sSSI - hold metformin - ac hs cbg checks  BPH Patient very resistant to the idea of foley d/c. Currently on flomax and terazosin. - monitor uop - continue terazosin, will half dose to 2 mg - continue flomax 0.4mg  daily - Patient agreed to attempt void trial today  Constipation - Last BM 3 days ago. -  Increased miralax to BID and added dulcolax.  FEN/GI: Heart healthy/carb modified Prophylaxis: Continue apixaban    Disposition:  pending snf placement  Subjective:  Patient states his PRN breathing treatments have been helping a lot. He believes his breathing is continuing to improve. Patient does have shallow breathing and is using accessory muscles during encounter, on 3L via nasal cannula.  Left arm cellulitis also seems to be improving compared to earlier photos with no visible signs of worsening infection. Patient did complain of some constipation for past 3 days. Increased miralax to BID and added dulcolax.   Discussed removing foley for void trial with patient. Patient was initially resistant to the idea stating he "knows what will happen" and that it "won't work". But after  further discussion the patient agreed to allow [place] to do a void trial.   Objective: Temp:  [97.5 F (36.4 C)] 97.5 F (36.4 C) (07/20 0643) Pulse Rate:  [95-121] 121 (07/20 0846) Resp:  [22-33] 22 (07/20 0643) BP: (111-133)/(58-76) 133/63 (07/20 0846) SpO2:  [93 %-98 %] 97 % (07/20  0643) Weight:  [85.7 kg] 85.7 kg (07/20 0643)    General: Alert and oriented in no apparent distress Heart: Irregular rhythm, no murmurs heard Lungs: Breathing shallow and tachypneic with accessory muscle use but lungs clear without wheezing. Abdomen: Bowel sounds present, no abdominal pain Skin: Warm and dry Extremities: No lower extremity edema. Left arm cellulitis improving without visible signs of worsening infection.   Laboratory: Recent Labs  Lab 12/18/18 0433 12/19/18 0532 12/21/18 0802  WBC 5.7 6.3 6.8  HGB 9.0* 9.0* 9.9*  HCT 28.0* 27.8* 31.2*  PLT 209 193 232   Recent Labs  Lab 12/17/18 0557  12/20/18 0329 12/21/18 0802 12/22/18 0409  NA  --    < > 136 137 136  K  --    < > 3.7 3.3* 3.8  CL  --    < > 95* 95* 95*  CO2  --    < > 30 32 31  BUN  --    < > 11 13 18   CREATININE  --    < > 0.74 0.70 0.72  CALCIUM  --    < > 8.7* 9.0 8.6*  PROT 5.6*  --   --   --   --   BILITOT 0.7  --   --   --   --   ALKPHOS 138*  --   --   --   --   ALT 25  --   --   --   --   AST 22  --   --   --   --   GLUCOSE  --    < > 185* 141* 122*   < > = values in this interval not displayed.     Imaging/Diagnostic Tests: No results found.  Lurline Del, MD 12/22/2018, 8:55 AM PGY-3, [place] Intern pager: [phone], text pages welcome

## 2018-12-22 NOTE — Progress Notes (Addendum)
Pt did not void. Refused to stand up to try voiding. Stated he did not want to urinate at all. Bladder scan residual 313 mL.    Dr. Vanessa [place] made aware.  Idolina Primer, RN

## 2018-12-22 NOTE — Care Management Important Message (Signed)
Important Message  Patient Details  Name: [name] MRN: [id number] Date of Birth: Apr 14, 1932   Medicare Important Message Given:  Yes     [name] 12/22/2018, 2:21 PM

## 2018-12-22 NOTE — Progress Notes (Addendum)
16 fr foley inserted as ordered.  Idolina Primer, RN

## 2018-12-22 NOTE — TOC Progression Note (Signed)
Transition of Care [facility]) - Progression Note    Patient Details  Name: [name] MRN: [id number] Date of Birth: 07-26-1931  [facility] CM/SW Contact  Eileen Stanford, LCSW Phone Number: 12/22/2018, 3:36 PM  Clinical Narrative:   [name] Covid test has come back negative. Pt can transfer to [facility] once d/c is complete.    Expected Discharge Plan: [place] Barriers to Discharge: Continued Medical Work up  Expected Discharge Plan and Services Expected Discharge Plan: [place] In-house Referral: NA Discharge Planning Services: CM Consult Post Acute Care Choice: [place] Living arrangements for the past 2 months: Single Family Home                                       Social Determinants of Health (SDOH) Interventions    Readmission Risk Interventions No flowsheet data found.

## 2018-12-22 NOTE — Progress Notes (Signed)
Report given to a nurse at [facility].  Idolina Primer, RN

## 2018-12-22 NOTE — Progress Notes (Signed)
Discussed pt about the risks of keeping the foley in.  Pt changed his mind and would like his Foley removed.  Dr. Vanessa [place] made aware.  Idolina Primer, RN

## 2018-12-24 DIAGNOSIS — I5030 Unspecified diastolic (congestive) heart failure: Secondary | ICD-10-CM | POA: Diagnosis not present

## 2018-12-24 DIAGNOSIS — L89159 Pressure ulcer of sacral region, unspecified stage: Secondary | ICD-10-CM | POA: Diagnosis not present

## 2018-12-24 DIAGNOSIS — E1165 Type 2 diabetes mellitus with hyperglycemia: Secondary | ICD-10-CM | POA: Diagnosis not present

## 2018-12-24 DIAGNOSIS — I2699 Other pulmonary embolism without acute cor pulmonale: Secondary | ICD-10-CM | POA: Diagnosis not present

## 2018-12-24 DIAGNOSIS — J449 Chronic obstructive pulmonary disease, unspecified: Secondary | ICD-10-CM | POA: Diagnosis not present

## 2018-12-24 DIAGNOSIS — I25119 Atherosclerotic heart disease of native coronary artery with unspecified angina pectoris: Secondary | ICD-10-CM | POA: Diagnosis not present

## 2018-12-24 DIAGNOSIS — M109 Gout, unspecified: Secondary | ICD-10-CM | POA: Diagnosis not present

## 2018-12-25 ENCOUNTER — Encounter: Payer: Self-pay | Admitting: *Deleted

## 2018-12-25 ENCOUNTER — Telehealth: Payer: Self-pay | Admitting: Cardiology

## 2018-12-25 MED ORDER — METOPROLOL TARTRATE 25 MG PO TABS: 25.0000 mg | ORAL_TABLET | Freq: Two times a day (BID) | ORAL | 3 refills | Status: DC

## 2018-12-25 NOTE — Telephone Encounter (Signed)
DC coreg; lopressor 25 mg BID Kirk Ruths

## 2018-12-25 NOTE — Telephone Encounter (Signed)
  Paula at [facility] needs to speak to the nurse in regards to a medication change that Dr Tyrone Schimke wants to make. Please give her a call back.

## 2018-12-25 NOTE — Telephone Encounter (Signed)
Left detailed message on [name]'s voicemail of dr Jacalyn Lefevre recommendations.

## 2018-12-25 NOTE — Telephone Encounter (Signed)
Spoke with Nevin Bloodgood from [facility]. She report pt was seen last night by Dr. Sharlett Iles, the facilities MD. She report MD is wanting to change pt's carvedilol dose to metoprolol 12.5 mg BID due to severe COPD but wanting to consult with Dr. Stanford Breed first.    Will route to MD

## 2018-12-26 ENCOUNTER — Other Ambulatory Visit: Payer: Self-pay | Admitting: *Deleted

## 2018-12-26 NOTE — Patient Outreach (Signed)
Member assessed for potential [facility] Care Management needs as a benefit of  [place] Medicare.  Member is currently receiving rehab therapy at [facility].   Member discussed with (covering) [place] UM RN. Discussed that writer is following along for potential [facility] Care Management needs, progression, and for disposition plans.   Will collaborate with [facility] UM team and facility on member.    Marthenia Rolling, MSN-Ed, RN,BSN [place] Acute Care Coordinator [phone] [facility]) [phone]  (Toll free office)

## 2018-12-30 DIAGNOSIS — R338 Other retention of urine: Secondary | ICD-10-CM | POA: Diagnosis not present

## 2018-12-31 ENCOUNTER — Emergency Department (HOSPITAL_COMMUNITY): Payer: Medicare Other

## 2018-12-31 ENCOUNTER — Inpatient Hospital Stay (HOSPITAL_COMMUNITY)
Admission: EM | Admit: 2018-12-31 | Discharge: 2019-01-06 | DRG: 291 | Disposition: A | Discharge status: Skilled Nursing Facility | Payer: Medicare Other | Attending: Family Medicine | Admitting: Family Medicine

## 2018-12-31 ENCOUNTER — Encounter (HOSPITAL_COMMUNITY): Payer: Self-pay | Admitting: Emergency Medicine

## 2018-12-31 ENCOUNTER — Other Ambulatory Visit: Payer: Self-pay

## 2018-12-31 DIAGNOSIS — Z20828 Contact with and (suspected) exposure to other viral communicable diseases: Secondary | ICD-10-CM | POA: Diagnosis present

## 2018-12-31 DIAGNOSIS — J918 Pleural effusion in other conditions classified elsewhere: Secondary | ICD-10-CM | POA: Diagnosis present

## 2018-12-31 DIAGNOSIS — I2510 Atherosclerotic heart disease of native coronary artery without angina pectoris: Secondary | ICD-10-CM | POA: Diagnosis present

## 2018-12-31 DIAGNOSIS — Z8349 Family history of other endocrine, nutritional and metabolic diseases: Secondary | ICD-10-CM

## 2018-12-31 DIAGNOSIS — J90 Pleural effusion, not elsewhere classified: Secondary | ICD-10-CM | POA: Diagnosis not present

## 2018-12-31 DIAGNOSIS — F419 Anxiety disorder, unspecified: Secondary | ICD-10-CM | POA: Diagnosis present

## 2018-12-31 DIAGNOSIS — Z794 Long term (current) use of insulin: Secondary | ICD-10-CM

## 2018-12-31 DIAGNOSIS — K5900 Constipation, unspecified: Secondary | ICD-10-CM | POA: Diagnosis not present

## 2018-12-31 DIAGNOSIS — I340 Nonrheumatic mitral (valve) insufficiency: Secondary | ICD-10-CM | POA: Diagnosis not present

## 2018-12-31 DIAGNOSIS — I447 Left bundle-branch block, unspecified: Secondary | ICD-10-CM | POA: Diagnosis present

## 2018-12-31 DIAGNOSIS — Z7901 Long term (current) use of anticoagulants: Secondary | ICD-10-CM | POA: Diagnosis not present

## 2018-12-31 DIAGNOSIS — I482 Chronic atrial fibrillation, unspecified: Secondary | ICD-10-CM | POA: Diagnosis not present

## 2018-12-31 DIAGNOSIS — I5033 Acute on chronic diastolic (congestive) heart failure: Secondary | ICD-10-CM | POA: Diagnosis present

## 2018-12-31 DIAGNOSIS — Z9889 Other specified postprocedural states: Secondary | ICD-10-CM

## 2018-12-31 DIAGNOSIS — Z9981 Dependence on supplemental oxygen: Secondary | ICD-10-CM

## 2018-12-31 DIAGNOSIS — R0902 Hypoxemia: Secondary | ICD-10-CM | POA: Diagnosis not present

## 2018-12-31 DIAGNOSIS — I248 Other forms of acute ischemic heart disease: Secondary | ICD-10-CM | POA: Diagnosis present

## 2018-12-31 DIAGNOSIS — E1151 Type 2 diabetes mellitus with diabetic peripheral angiopathy without gangrene: Secondary | ICD-10-CM | POA: Diagnosis present

## 2018-12-31 DIAGNOSIS — I083 Combined rheumatic disorders of mitral, aortic and tricuspid valves: Secondary | ICD-10-CM | POA: Diagnosis present

## 2018-12-31 DIAGNOSIS — I4819 Other persistent atrial fibrillation: Secondary | ICD-10-CM | POA: Diagnosis not present

## 2018-12-31 DIAGNOSIS — Z8249 Family history of ischemic heart disease and other diseases of the circulatory system: Secondary | ICD-10-CM

## 2018-12-31 DIAGNOSIS — I5023 Acute on chronic systolic (congestive) heart failure: Secondary | ICD-10-CM | POA: Diagnosis not present

## 2018-12-31 DIAGNOSIS — Z952 Presence of prosthetic heart valve: Secondary | ICD-10-CM

## 2018-12-31 DIAGNOSIS — I499 Cardiac arrhythmia, unspecified: Secondary | ICD-10-CM | POA: Diagnosis not present

## 2018-12-31 DIAGNOSIS — K30 Functional dyspepsia: Secondary | ICD-10-CM | POA: Diagnosis not present

## 2018-12-31 DIAGNOSIS — J80 Acute respiratory distress syndrome: Secondary | ICD-10-CM | POA: Diagnosis not present

## 2018-12-31 DIAGNOSIS — E119 Type 2 diabetes mellitus without complications: Secondary | ICD-10-CM | POA: Diagnosis not present

## 2018-12-31 DIAGNOSIS — I10 Essential (primary) hypertension: Secondary | ICD-10-CM | POA: Diagnosis not present

## 2018-12-31 DIAGNOSIS — G8929 Other chronic pain: Secondary | ICD-10-CM | POA: Diagnosis present

## 2018-12-31 DIAGNOSIS — R0601 Orthopnea: Secondary | ICD-10-CM | POA: Diagnosis not present

## 2018-12-31 DIAGNOSIS — J42 Unspecified chronic bronchitis: Secondary | ICD-10-CM | POA: Diagnosis not present

## 2018-12-31 DIAGNOSIS — I252 Old myocardial infarction: Secondary | ICD-10-CM | POA: Diagnosis not present

## 2018-12-31 DIAGNOSIS — I342 Nonrheumatic mitral (valve) stenosis: Secondary | ICD-10-CM | POA: Diagnosis not present

## 2018-12-31 DIAGNOSIS — Z79891 Long term (current) use of opiate analgesic: Secondary | ICD-10-CM

## 2018-12-31 DIAGNOSIS — E785 Hyperlipidemia, unspecified: Secondary | ICD-10-CM | POA: Diagnosis present

## 2018-12-31 DIAGNOSIS — Z960 Presence of urogenital implants: Secondary | ICD-10-CM | POA: Diagnosis not present

## 2018-12-31 DIAGNOSIS — M549 Dorsalgia, unspecified: Secondary | ICD-10-CM | POA: Diagnosis present

## 2018-12-31 DIAGNOSIS — R0602 Shortness of breath: Secondary | ICD-10-CM | POA: Diagnosis not present

## 2018-12-31 DIAGNOSIS — G4730 Sleep apnea, unspecified: Secondary | ICD-10-CM | POA: Diagnosis present

## 2018-12-31 DIAGNOSIS — R0603 Acute respiratory distress: Secondary | ICD-10-CM | POA: Diagnosis not present

## 2018-12-31 DIAGNOSIS — I361 Nonrheumatic tricuspid (valve) insufficiency: Secondary | ICD-10-CM | POA: Diagnosis not present

## 2018-12-31 DIAGNOSIS — I110 Hypertensive heart disease with heart failure: Principal | ICD-10-CM | POA: Diagnosis present

## 2018-12-31 DIAGNOSIS — F329 Major depressive disorder, single episode, unspecified: Secondary | ICD-10-CM | POA: Diagnosis present

## 2018-12-31 DIAGNOSIS — I4891 Unspecified atrial fibrillation: Secondary | ICD-10-CM | POA: Diagnosis not present

## 2018-12-31 DIAGNOSIS — J441 Chronic obstructive pulmonary disease with (acute) exacerbation: Secondary | ICD-10-CM | POA: Diagnosis present

## 2018-12-31 DIAGNOSIS — M109 Gout, unspecified: Secondary | ICD-10-CM | POA: Diagnosis not present

## 2018-12-31 DIAGNOSIS — Z9911 Dependence on respirator [ventilator] status: Secondary | ICD-10-CM | POA: Diagnosis not present

## 2018-12-31 DIAGNOSIS — I4811 Longstanding persistent atrial fibrillation: Secondary | ICD-10-CM | POA: Diagnosis not present

## 2018-12-31 DIAGNOSIS — Z87891 Personal history of nicotine dependence: Secondary | ICD-10-CM | POA: Diagnosis not present

## 2018-12-31 DIAGNOSIS — I509 Heart failure, unspecified: Secondary | ICD-10-CM | POA: Diagnosis not present

## 2018-12-31 DIAGNOSIS — R339 Retention of urine, unspecified: Secondary | ICD-10-CM | POA: Diagnosis not present

## 2018-12-31 DIAGNOSIS — J9601 Acute respiratory failure with hypoxia: Secondary | ICD-10-CM | POA: Diagnosis not present

## 2018-12-31 DIAGNOSIS — K219 Gastro-esophageal reflux disease without esophagitis: Secondary | ICD-10-CM | POA: Diagnosis present

## 2018-12-31 DIAGNOSIS — D649 Anemia, unspecified: Secondary | ICD-10-CM | POA: Diagnosis present

## 2018-12-31 DIAGNOSIS — J449 Chronic obstructive pulmonary disease, unspecified: Secondary | ICD-10-CM | POA: Diagnosis not present

## 2018-12-31 DIAGNOSIS — R0600 Dyspnea, unspecified: Secondary | ICD-10-CM

## 2018-12-31 DIAGNOSIS — Z79899 Other long term (current) drug therapy: Secondary | ICD-10-CM

## 2018-12-31 DIAGNOSIS — Z86711 Personal history of pulmonary embolism: Secondary | ICD-10-CM

## 2018-12-31 DIAGNOSIS — Z951 Presence of aortocoronary bypass graft: Secondary | ICD-10-CM

## 2018-12-31 DIAGNOSIS — J9621 Acute and chronic respiratory failure with hypoxia: Secondary | ICD-10-CM | POA: Diagnosis present

## 2018-12-31 DIAGNOSIS — I517 Cardiomegaly: Secondary | ICD-10-CM | POA: Diagnosis not present

## 2018-12-31 DIAGNOSIS — N400 Enlarged prostate without lower urinary tract symptoms: Secondary | ICD-10-CM | POA: Diagnosis present

## 2018-12-31 DIAGNOSIS — Z955 Presence of coronary angioplasty implant and graft: Secondary | ICD-10-CM

## 2018-12-31 LAB — CBC WITH DIFFERENTIAL/PLATELET
Abs Immature Granulocytes: 0.03 10*3/uL (ref 0.00–0.07)
Basophils Absolute: 0 10*3/uL (ref 0.0–0.1)
Basophils Relative: 1 %
Eosinophils Absolute: 0.1 10*3/uL (ref 0.0–0.5)
Eosinophils Relative: 2 %
HCT: 28.3 % — ABNORMAL LOW (ref 39.0–52.0)
Hemoglobin: 9 g/dL — ABNORMAL LOW (ref 13.0–17.0)
Immature Granulocytes: 0 %
Lymphocytes Relative: 9 %
Lymphs Abs: 0.6 10*3/uL — ABNORMAL LOW (ref 0.7–4.0)
MCH: 28.1 pg (ref 26.0–34.0)
MCHC: 31.8 g/dL (ref 30.0–36.0)
MCV: 88.4 fL (ref 80.0–100.0)
Monocytes Absolute: 0.9 10*3/uL (ref 0.1–1.0)
Monocytes Relative: 13 %
Neutro Abs: 5.3 10*3/uL (ref 1.7–7.7)
Neutrophils Relative %: 75 %
Platelets: 288 10*3/uL (ref 150–400)
RBC: 3.2 MIL/uL — ABNORMAL LOW (ref 4.22–5.81)
RDW: 16.1 % — ABNORMAL HIGH (ref 11.5–15.5)
WBC: 7.1 10*3/uL (ref 4.0–10.5)
nRBC: 0 % (ref 0.0–0.2)

## 2018-12-31 LAB — BASIC METABOLIC PANEL
Anion gap: 9 (ref 5–15)
BUN: 15 mg/dL (ref 8–23)
CO2: 29 mmol/L (ref 22–32)
Calcium: 8.6 mg/dL — ABNORMAL LOW (ref 8.9–10.3)
Chloride: 96 mmol/L — ABNORMAL LOW (ref 98–111)
Creatinine, Ser: 0.63 mg/dL (ref 0.61–1.24)
GFR calc Af Amer: >60 mL/min (ref >60)
GFR calc non Af Amer: >60 mL/min (ref >60)
Glucose, Bld: 140 mg/dL — ABNORMAL HIGH (ref 70–99)
Potassium: 4 mmol/L (ref 3.5–5.1)
Sodium: 134 mmol/L — ABNORMAL LOW (ref 135–145)

## 2018-12-31 LAB — TROPONIN I (HIGH SENSITIVITY)
Troponin I (High Sensitivity): 27 ng/L — ABNORMAL HIGH (ref <18)
Troponin I (High Sensitivity): 34 ng/L — ABNORMAL HIGH (ref <18)

## 2018-12-31 LAB — PROTIME-INR
INR: 1.9 — ABNORMAL HIGH (ref 0.8–1.2)
Prothrombin Time: 21.7 seconds — ABNORMAL HIGH (ref 11.4–15.2)

## 2018-12-31 LAB — SARS CORONAVIRUS 2 BY RT PCR (HOSPITAL ORDER, PERFORMED IN ~~LOC~~ HOSPITAL LAB): SARS Coronavirus 2: NEGATIVE

## 2018-12-31 LAB — BRAIN NATRIURETIC PEPTIDE: B Natriuretic Peptide: 305.1 pg/mL — ABNORMAL HIGH (ref 0.0–100.0)

## 2018-12-31 MED ORDER — ALBUTEROL SULFATE HFA 108 (90 BASE) MCG/ACT IN AERS
2.0000 | INHALATION_SPRAY | RESPIRATORY_TRACT | Status: DC | PRN
  Filled 2018-12-31: qty 6.7

## 2018-12-31 MED ORDER — IPRATROPIUM-ALBUTEROL 0.5-2.5 (3) MG/3ML IN SOLN
3.0000 mL | Freq: Once | RESPIRATORY_TRACT | Status: AC
  Administered 2018-12-31: 21:00:00 3 mL via RESPIRATORY_TRACT
  Filled 2018-12-31: qty 3

## 2018-12-31 MED ORDER — FUROSEMIDE 10 MG/ML IJ SOLN
40.0000 mg | Freq: Once | INTRAMUSCULAR | Status: AC
  Administered 2018-12-31: 21:00:00 40 mg via INTRAVENOUS
  Filled 2018-12-31: qty 4

## 2018-12-31 MED ORDER — METHYLPREDNISOLONE SODIUM SUCC 125 MG IJ SOLR
125.0000 mg | Freq: Once | INTRAMUSCULAR | Status: AC
  Administered 2018-12-31: 125 mg via INTRAVENOUS
  Filled 2018-12-31: qty 2

## 2018-12-31 MED ORDER — IPRATROPIUM-ALBUTEROL 0.5-2.5 (3) MG/3ML IN SOLN
RESPIRATORY_TRACT | Status: AC
  Filled 2018-12-31: qty 3

## 2018-12-31 MED ORDER — ALBUTEROL SULFATE HFA 108 (90 BASE) MCG/ACT IN AERS
4.0000 | INHALATION_SPRAY | Freq: Once | RESPIRATORY_TRACT | Status: AC
  Administered 2018-12-31: 4 via RESPIRATORY_TRACT
  Filled 2018-12-31: qty 6.7

## 2018-12-31 MED ORDER — METOPROLOL TARTRATE 25 MG PO TABS: 25.0000 mg | ORAL_TABLET | Freq: Once | ORAL | Status: DC

## 2018-12-31 NOTE — ED Triage Notes (Signed)
Pt back from [facility] home with c/o sob on 3 liters otc , pt bumped up to 4 liters by EMS , pt has history of chf

## 2018-12-31 NOTE — ED Notes (Signed)
Spoke to pt's wife with update.

## 2018-12-31 NOTE — H&P (Addendum)
[facility] Admission History and Physical Service Pager: [phone]  Patient name: [name] Medical record number: [id number] Date of birth: [birth year] Age: 85 y.o. Gender: male  Primary Care Provider: Nicoletta Dress, MD Consultants: none Code Status: Full Preferred Emergency Contact: Wife, [name] [phone]  Chief Complaint: Dyspnea   Assessment and Plan: [name] is a 85 y.o. male presenting with shortness of breath, felt to be likely multifactorial 2/2 COPD and CHF. PMH is significant for COPD on Home oxygen 3L, CHF, s/p TAVR, DM Type2, CAD s/p CABG, recent PE, chronic AFIb on Eliquis.   Acute on chronic hypoxic respiratory failure, likely multifactorial 2/2 COPD and CHF: Pt has Hx COPD, uses oxygen at home 3L, now has increased to 5L. Associated with productive cough of thick yellow sputum and increasing orthopnea. Recently admitted from 7/15-7/20 after hospitalization due to similar complaints, symptomatology returned shortly after. On exam, lung sounds diminished throughout, no wheezing ort crackles appreciated. Visually in moderate respiratory distress with tachypnea into the mid 30's/accessory muscle use, pursed lips breathing, and unable to speak more than 1-2 words at a time. BNP 305 (310 on 7/15), hgb 9 at baseline. CXR showing mild diffuse interstitial markings with small effusions. Suspect clinical presentation is likely multifactorial, with considerations for acute on chronic HFpEF and COPD exacerbation with worsening productive cough and orthopnea not fully resolved since recent admission. Differential also includes pneumonia, PE, A fib, anemia, ACS. Given that he is afebrile, chest xray shows no consolidation, WBC 7.6 with no left shift, pneumonia is lower on the list of differentials. Considered PE, given recently diagnosed with acute provoked PE in 11/2018, however recent CT on 7/15 without evidence of thrombus and on therapeutic  eliquis. Reassured COVID negative on admit. Due to respiratory status, gave patient additional breathing treatment, IV lasix, and started on BiPAP during evaluation in the ED.  -Admit to progressive, attending Dr. Owens Shark -Xopenex/ipratropium nebs q4 scheduled -BiPAP overnight for WOB, will trial off in a few hours (per pt request)  -Keep NPO while on BiPAP -Start Doxycycline 100 mg BID x5 days (less likely azithro given known arrhthymias with LBBB)  -S/p solu medrol in the ED, start prednisone 40mg  in the am (consider tapered course given 3rd recent course 2/2 breathing status)  -Lasix IV 40mg  once tonight, reassess in am   -Strict I and O's, daily weights (weight pending on admit)   Chest Tightness atypical resolved- pt states having chest tightness that started today with increase effort in breathing.  Suspect secondary to current breathing difficulty with COPD exacerbation. Considered ACS given significant risk factors, however reassured troponins 27, 34. EKG showing A fib with LBBB (chronic) and downsloping ST depressions in V5/V6, however believe these are more secondary to arrhythmia present/HF and present in prior recent EKGs.  -Trend Troponin overnight -Repeat EKG in the am  Afib, persistent: borderline controlled.  HR intermittently ranging from 90-120, on average low 100's. Suspect slight increase was in the setting of duoneb treatments with worsening breathing status, once placed on Bipap/lasix with improvement in breathing, has maintained around 100. Will likely given home metoprolol oral dose once trial off of Bipap. However, will have low threshold to start dilt drip if HR remains above 120 consistently. Home meds Apixaban 5mg  BID,  Metoprolol 12.5 mg BID.  -Continue Apixiban when po -Continue home Metoprolol- clarify correct dose in the am (on med rec pt states he has been taking 12.5 BID, however prescribed per cardiologist for 25  BID, pt unsure at time of admit)  -Cardiac  monitoring -EKG in am   HFpEF: Acute on chronic as discussed above.    Last ECHO (11/24/2018) LVEF 50-55% but LV function not evaluated due to Afib. Home meds Lasix 60 mg daily and Spironolactone 12.5 mg daily. Follows with Dr. Stanford Breed, cardiology.  -Acute management as above  -Repeat ECHO -Continue home Spironolactone 12.5 mg BID when po -BMP in am  COPD: Acute on chronic.  ON 3L of O2 chronically. Uses Incruse daily and symbicort PRN. Suspect GOLD group D status,  recently treated x2 for COPD exacerbation due to worsening respiratory status in the past few weeks.  -Acute management as above  -Hold home inhalers while on management as above  -Consider adding on daily LABA to LAMA for baseline control  -maintain oxygenation 88-92%   Recent provoked PE: Stable.  Diagnosed on 6/21 within distal R lower lobar PA. Received IV heparin, ultimately transitioned to treatment dose eliquis. Now down to 5 BID, additional anti-coagulation for chronic A. Fib. No evidence of PE on CT performed on 7/15.   CAD, s/p CABG in 1994: Stable.  Endorses chest tightness on admit rather than pain discussed above, likely with respiratory status.   -Cont home atorvastatin 80mg    HTN-chronic stable- BP on admission 140/73 .  Home meds Home med Benazepril 5 mg daily Metoprolol 12.5 mg BID -Cont home medications   DMType2 chronic stable- Last HbA1C 6.1 (12/17/2018) Home meds Metformin 500 mg daily, CBG on admission 140.  -Monitor CBG with meals and nightly -Add SSI (suspect may need a little while on steroids despite well controlled on minimal therapy at home)  -home home metformin while inpt  GERD chronic- asymptomatic, home meds Protionix 40 mg daily -Continue Protonix  BPH: Chronic.  Failed voiding trial on last admission, foley catheter placed prior to d/c (on 7/20). Follow up with urology scheduled, pt endorses seeing urologist today with no change in current management.  -Indwelling catheter in place   -Cont home flomax 0.4mg    Chronic back pain: Stable.  Takes norco 5-325mg  q4 hours PRN at home. Will hold currently due to respiratory status, could consider restarting in future.  -Tylenol 650mg  q6 prn -Lidocaine patch  -K pad PRN    FEN/GI: NPO/ Protonix Prophylaxis: Apixaban  Disposition: Progressive, Attending Dr. Owens Shark  History of Present Illness:  [name] is a 85 y.o. male presents to ED from [facility] facility with difficulty breathing. Pt states that he has been having shortness of breath for about 8 days.  He was recently discharged from our service on 7/20 after being admitted for difficulty breathing and treated for COPD and CHF.  He states that his breathing was better on the day of discharge but has become more difficult in the past 8 days.  He reports having a productive cough with some increase in phlegm that is thicker and yellow-green in color.  He has no pain with breathing but does complain of some chest tightness that recently started with his increasing effort in breathing. Denies any chest pain, nausea or vomiting or abdominal pain.  He has been unable to carry on a full conversation these past few days due to increasing shortness of breath.  He denies any weight gain or increase in leg swelling.  He does have difficulty laying flat which he states is a new.  He takes Lasix daily and states he had his medication today.  He also states that he uses a  breathing treatment at home three times a day with some mild relief.  He reports using Home oxygen 2L that was increased to 4L earlier today.  He also reports having difficulty voiding for which he had failed a void trial on last admission. An indwelling catheter was placed prior discharge and pt reports seeing a urologist today without any changes in management.  In the ED, vital signs HR 107 irregular BP 141/64, RR 36 and shallow, he received albuterol nebulizer x1 with minimal relief and solumedrol 125mg  IVx1dose.  COVID negative. He was placed on Oxygen 5L nasal cannula.   Review Of Systems: Per HPI with the following additions:   Review of Systems  Constitutional: Negative for chills and fever.  Respiratory: Positive for cough, sputum production, shortness of breath and wheezing. Negative for hemoptysis.   Cardiovascular: Positive for orthopnea. Negative for chest pain, palpitations and leg swelling.  Gastrointestinal: Negative for abdominal pain, blood in stool, constipation, diarrhea, nausea and vomiting.  Neurological: Negative for weakness and headaches.    Patient Active Problem List   Diagnosis Date Noted  . Acute dyspnea 12/31/2018  . CHF (congestive heart failure) ([place]) 12/17/2018  . Pressure injury of skin 12/17/2018  . Hypoxemia   . Pulmonary embolism and infarction ([place])   . Acute on chronic respiratory failure with hypoxia ([place])   . SOB (shortness of breath)   . Cellulitis 11/15/2018  . 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 Severe aortic stenosis 09/17/2017  . Mitral stenosis   . Aftercare following surgery of the circulatory system 03/15/2014  . Carotid stenosis 03/15/2014  . Unstable angina ([place]) 08/31/2013  . Acute myocardial infarction, unspecified site, initial episode of care   . Aortic stenosis   . Atrial fibrillation ([place])   . Vertigo 06/27/2013  . Chest pain with moderate risk of acute coronary syndrome 06/27/2013  . CAD - CABG '94, low risk Myoview May 2014 06/27/2013  . Chronic atrial fibrillation 06/27/2013  . Chronic anticoagulation 06/27/2013  . Gout attack- on steroid dose pack 06/27/2013  . Tachycardia- beta blocker increased 06/26/2013  . Essential hypertension, benign 03/10/2009  . HYPERLIPIDEMIA 09/09/2008  . UNSPECIFIED ANEMIA 09/09/2008  . CAROTID STENOSIS- moderate 09/09/2008  . UNSPECIFIED CEREBROVASCULAR DISEASE 09/09/2008  . RENAL ARTERY STENOSIS- moderate 09/09/2008  . PERIPHERAL VASCULAR DISEASE 09/09/2008  . PEPTIC  ULCER DISEASE, HX OF 09/09/2008    Past Medical History: Past Medical History:  Diagnosis Date  . Acute myocardial infarction, unspecified site, initial episode of care   . Anemia   . Anxiety   . Aortic stenosis    . Atrial fibrillation ([place])    a. permanent, on Coumadin for anticoagulation  . CAD (coronary artery disease)    a. s/p CABG in 1994 b. cath in 2015 showing normal LM, 100% LCx and RCA stenosis with 50-60% prox LAD stenosis and 100% mid-LAD stenosis; patent sequential SVG-OM2-OM3 and patent LIMA-LAD with PTCA of distal LAD via LIMA graft performed at that time  . Carotid stenosis   . CHF (congestive heart failure) ([place])   . COPD (chronic obstructive pulmonary disease) ([place])   . Depression   . Diabetes mellitus without complication ([place])    FASTING 98-120S  . GERD (gastroesophageal reflux disease)   . History of blood transfusion   . History of peptic ulcer disease   . Hyperlipidemia   . Hypertension   . Myocardial infarction ([place])    X2  . Peripheral vascular disease ([place])   . Pneumonia  HX OF  . Renal artery stenosis ([place])   . Sleep apnea    OXYGEN AT NIGHT 2L [place]    Past Surgical History: Past Surgical History:  Procedure Laterality Date  . AORTIC ARCH ANGIOGRAPHY N/A 08/14/2017   Procedure: AORTIC ARCH ANGIOGRAPHY;  Surgeon: Sherren Mocha, MD;  Location: [facility];  Service: Cardiovascular;  Laterality: N/A;  . CARDIAC CATHETERIZATION    . CAROTID ENDARTERECTOMY    . COLON SURGERY    . CORONARY ARTERY BYPASS GRAFT  1994  . ENDARTERECTOMY Right 01/05/2013   Procedure: ENDARTERECTOMY CAROTID;  Surgeon: Rosetta Posner, MD;  Location: [facility];  Service: Vascular;  Laterality: Right;  . INCISION AND DRAINAGE Left 11/16/2018   Procedure: INCISION AND DRAINAGE LEFT FOREARM/ARM;  Surgeon: Leanora Cover, MD;  Location: [facility];  Service: Orthopedics;  Laterality: Left;  . KNEE SURGERY  08/2003   left knee/ARTHROSCOPIC  . LEFT HEART CATHETERIZATION WITH  CORONARY/GRAFT ANGIOGRAM N/A 09/01/2013   Procedure: LEFT HEART CATHETERIZATION WITH Beatrix Fetters;  Surgeon: Sinclair Grooms, MD;  Location: [facility];  Service: Cardiovascular;  Laterality: N/A;  . PATCH ANGIOPLASTY Right 01/05/2013   Procedure: PATCH ANGIOPLASTY;  Surgeon: Rosetta Posner, MD;  Location: [place];  Service: Vascular;  Laterality: Right;  . PERCUTANEOUS CORONARY STENT INTERVENTION (PCI-S)  09/01/2013   Procedure: PERCUTANEOUS CORONARY STENT INTERVENTION (PCI-S);  Surgeon: Sinclair Grooms, MD;  Location: [facility] LAB;  Service: Cardiovascular;;  . removal of bleeding ulcer  1994  . RENAL ARTERY STENT     stenting of the left renal artery as well as a cutting balloon angioplasty for treatment of in-stent restenosis coronary artery bypass grafting in 1994.  [name] RIGHT/LEFT HEART CATH AND CORONARY/GRAFT ANGIOGRAPHY N/A 08/14/2017   Procedure: RIGHT/LEFT HEART CATH AND CORONARY/GRAFT ANGIOGRAPHY;  Surgeon: Sherren Mocha, MD;  Location: [place];  Service: Cardiovascular;  Laterality: N/A;  . TEE WITHOUT CARDIOVERSION N/A 09/17/2017   Procedure: TRANSESOPHAGEAL ECHOCARDIOGRAM (TEE);  Surgeon: Sherren Mocha, MD;  Location: [place];  Service: Open Heart Surgery;  Laterality: N/A;    Social History: Social History   Tobacco Use  . Smoking status: Former Smoker    Quit date: 04/27/1993    Years since quitting: 25.6  . Smokeless tobacco: Never Used  Substance Use Topics  . Alcohol use: No    Alcohol/week: 0.0 standard drinks  . Drug use: No   Additional social history:   Please also refer to relevant sections of EMR.  Family History: Family History  Problem Relation Age of Onset  . Coronary artery disease Mother   . Heart disease Mother        Before age 7  . Hyperlipidemia Mother   . Hypertension Mother   . Heart attack Mother   . Coronary artery disease Father   . Heart disease Father        After age 22  . Heart attack Father   . Heart disease  Sister        After age 79  . Heart disease Brother        After age 74  . Coronary artery disease Other        13 sibling, almost all have coronary disease and some with premature onset  . Heart disease Other        13 sibling, almost all have coronary disease and some with premature onset   (If not completed, MUST add something in)  Allergies  and Medications: Allergies  Allergen Reactions  . Bactrim [Sulfamethoxazole-Trimethoprim] Other (See Comments)    "Allergic," per MAR  . Levaquin [Levofloxacin In D5w] Diarrhea and Other (See Comments)    "Allergic," per MAR   No current facility-administered medications on file prior to encounter.    Current Outpatient Medications on File Prior to Encounter  Medication Sig Dispense Refill  . allopurinol (ZYLOPRIM) 300 MG tablet Take 300 mg by mouth daily.    . Amino Acids-Protein Hydrolys (PRO-STAT) LIQD Take 30 mLs by mouth 2 (two) times a day.    [name] apixaban (ELIQUIS) 5 MG TABS tablet Take 1 tablet (5 mg total) by mouth 2 (two) times daily. 60 tablet   . atorvastatin (LIPITOR) 80 MG tablet Take 1 tablet (80 mg total) by mouth at bedtime. 90 tablet 3  . benazepril (LOTENSIN) 5 MG tablet Take 1 tablet (5 mg total) by mouth daily. 30 tablet 0  . bisacodyl (DULCOLAX) 5 MG EC tablet Take 1 tablet (5 mg total) by mouth daily. 30 tablet 0  . brimonidine (ALPHAGAN) 0.2 % ophthalmic solution Place 1 drop into both eyes 2 (two) times daily.    . ferrous sulfate 325 (65 FE) MG tablet Take 325 mg by mouth daily with breakfast.     . furosemide (LASIX) 40 MG tablet Take 1 tablet (40 mg total) by mouth daily. (Patient taking differently: Take 60 mg by mouth daily. ) 30 tablet 0  . Gauze Pads & Dressings (FOAM DRESSING) PADS Apply 1 patch topically See admin instructions. Apply a foam dressing to sacrum every day AND as needed for redness    . HYDROcodone-acetaminophen (NORCO/VICODIN) 5-325 MG tablet Take 1-2 tablets by mouth every 4 (four) hours as needed  for moderate pain or severe pain.     [name] insulin aspart (NOVOLOG FLEXPEN) 100 UNIT/ML FlexPen Inject 2 Units into the skin See admin instructions. Inject 2 units into the skin at 9 AM in the morning for a BGL >200    . lansoprazole (PREVACID) 30 MG capsule Take 30 mg by mouth daily before breakfast.     . levalbuterol (XOPENEX) 0.63 MG/3ML nebulizer solution Take 3 mLs (0.63 mg total) by nebulization every 6 (six) hours as needed for wheezing or shortness of breath. (Patient taking differently: Take 0.63 mg by nebulization 3 (three) times daily. ) 3 mL 12  . metFORMIN (GLUCOPHAGE-XR) 500 MG 24 hr tablet Take 500 mg by mouth daily.    . methocarbamol (ROBAXIN) 750 MG tablet Take 750 mg by mouth every 12 (twelve) hours as needed for muscle spasms.     . metoprolol tartrate (LOPRESSOR) 25 MG tablet Take 1 tablet (25 mg total) by mouth 2 (two) times daily. (Patient taking differently: Take 12.5 mg by mouth 2 (two) times daily. ) 180 tablet 3  . Multiple Vitamin (MULTIVITAMIN WITH MINERALS) TABS Take 1 tablet by mouth daily.    . NON FORMULARY Take 240 mLs by mouth See admin instructions. MedPass: Drink 240 ml's by mouth two times a day    . OXYGEN Inhale 3-4 L/min into the lungs continuous. 3 liters/min continuously TO MAINTAIN SATS <90% and may increase to 4 liters/min as needed with dyspnea upon exertion or shortness of breath    . polyethylene glycol (MIRALAX / GLYCOLAX) 17 g packet Take 17 g by mouth 2 (two) times daily. (Patient taking differently: Take 17 g by mouth 2 (two) times daily. MIXED IN LIQUID DRINK) 14 each 0  . spironolactone (ALDACTONE) 25  MG tablet Take 12.5 mg by mouth daily.    . tamsulosin (FLOMAX) 0.4 MG CAPS capsule Take 1 capsule (0.4 mg total) by mouth daily after supper. (Patient taking differently: Take 0.4 mg by mouth every evening. ) 30 capsule   . trolamine salicylate (ASPERCREME) 10 % cream Apply 1 application topically every 6 (six) hours as needed for muscle pain.     [name]  umeclidinium bromide (INCRUSE ELLIPTA) 62.5 MCG/INH AEPB Inhale 1 puff into the lungs daily. (Patient taking differently: Inhale 1 puff into the lungs at bedtime. ) 1 each 0  . acetaminophen-codeine (TYLENOL #3) 300-30 MG tablet Take 1 tablet by mouth 2 (two) times daily as needed for pain.    . budesonide-formoterol (SYMBICORT) 80-4.5 MCG/ACT inhaler Inhale 1 puff into the lungs as needed for shortness of breath.    . terazosin (HYTRIN) 2 MG capsule Take 1 capsule (2 mg total) by mouth at bedtime. (Patient not taking: Reported on 12/31/2018) 30 capsule 0    Objective: BP (!) 113/57   Pulse (!) 104   Temp 98.2 F (36.8 C) (Oral)   Resp (!) 23   SpO2 100%  Exam: General: In mild respiratory distress Eyes:PERRLA ENTM: EOM's intact, mucous membranes moist Neck: supple, non tender, no JVD Cardiovascular: IRR 111, no murmurs appreciated, no pain elicited on palpation  Respiratory: RR mid 30's, shallow with increased work of breathing, using abdominal muscles, pursed lips, only able to say 1-2 words at a time before stopping for breaths. Faint diminished breath sounds throughout, did not appreciate wheezing or crackles.  Gastrointestinal: round, soft, non tender, BS present Extremities: warm and dry, LE swelling present L>R  Derm: no redness, no rashes, no abrasions Neuro:A&Ox4 Psych: Pleasant and cooperative   Labs and Imaging: CBC BMET  Recent Labs  Lab 12/31/18 1515  WBC 7.1  HGB 9.0*  HCT 28.3*  PLT 288   Recent Labs  Lab 12/31/18 1515  NA 134*  K 4.0  CL 96*  CO2 29  BUN 15  CREATININE 0.63  GLUCOSE 140*  CALCIUM 8.6*     EKG: Age not entered, assumed to be 85 years old for purpose of ECG interpretation Atrial fibrillation IVCD, consider atypical LBBB ST depression laterally Confirmed by Malvin Johns [phone]) on 12/31/2018 2:34:39 PM  Dg Chest Port 1 View  Result Date: 12/31/2018 CLINICAL DATA:  Shortness of breath EXAM: PORTABLE CHEST 1 VIEW COMPARISON:   12/19/2018 FINDINGS: No significant change in mild, diffuse bilateral interstitial pulmonary opacity, small layering bilateral pleural effusions, and gross cardiomegaly. There is no new or focal airspace opacity. IMPRESSION: No significant change in mild, diffuse bilateral interstitial pulmonary opacity, small layering bilateral pleural effusions, and gross cardiomegaly. There is no new or focal airspace opacity. Electronically Signed   By: Eddie Candle M.D.   On: 12/31/2018 14:43    Carollee Leitz, MD 12/31/2018, 10:36 PM PGY-1, [facility] pager: [phone], text pages welcome  FPTS Upper-Level Resident Addendum   I have independently interviewed and examined the patient. I have discussed the above with the original author and agree with their documentation. My edits for correction/addition/clarification are in green. Please see also any attending notes.    Patriciaann Clan, DO  Family Medicine PGY-2

## 2018-12-31 NOTE — ED Provider Notes (Signed)
85 yo male from [facility] on chronic oxygen, chf, copd presents with increased dyspnea. No chest pain CXR- similar to prior some increased marking and effusion EKG with some st depression laterally- troponin ordered Labs pending Consider lasix if sxs not better after albuterol Covid pending Likely admission to [facility] (just discharged 7/20 from their service)  85 year old man with CHF, COPD, on chronic oxygen presents today with increased dyspnea today.  He was recently hospitalized for above.  He was discharged to collapse nursing care.  He has been improving there until today when he became more short of breath with cough productive of some yellowish sputum.  Here he received albuterol with some minimal relief but continues to be dyspneic with tachypnea to 33. Plan COVID testing and admission.  Discussed with FP resident Dr. Volanda Napoleon and will see for admission   Pattricia Boss, MD 12/31/18 1858

## 2018-12-31 NOTE — ED Notes (Signed)
[phone] (Mrs. Rapaport) updated. Please call at any time once bed is assigned.

## 2018-12-31 NOTE — ED Provider Notes (Signed)
[facility] EMERGENCY DEPARTMENT Provider Note   CSN: [id number] Arrival date & time: 12/31/18  1349    History   Chief Complaint Chief Complaint  Patient presents with  . Shortness of Breath    HPI TERRIUS GENTILE is a 85 y.o. male.     Patient is 85 year old male with a history of COPD, CHF and PE with atrial fibrillation who presents with shortness of breath.  He had a recent admission to the family medicine service for CHF exacerbation.  [facility].  He states he woke up this morning it was markedly more short of breath.  He is tried couple breathing treatments with no significant improvement.  He is on chronic oxygen at 3 L but it has been bumped up to 4 L today.  He denies any associated chest pain.  No nausea or vomiting.  He has a little bit of a cough which is sometimes productive of yellow sputum.  No known fevers.  No diarrhea.  No increased leg swelling.     Past Medical History:  Diagnosis Date  . Acute myocardial infarction, unspecified site, initial episode of care   . Anemia   . Anxiety   . Aortic stenosis    . Atrial fibrillation ([place])    a. permanent, on Coumadin for anticoagulation  . CAD (coronary artery disease)    a. s/p CABG in 1994 b. cath in 2015 showing normal LM, 100% LCx and RCA stenosis with 50-60% prox LAD stenosis and 100% mid-LAD stenosis; patent sequential SVG-OM2-OM3 and patent LIMA-LAD with PTCA of distal LAD via LIMA graft performed at that time  . Carotid stenosis   . CHF (congestive heart failure) ([place])   . COPD (chronic obstructive pulmonary disease) ([place])   . Depression   . Diabetes mellitus without complication ([place])    FASTING 98-120S  . GERD (gastroesophageal reflux disease)   . History of blood transfusion   . History of peptic ulcer disease   . Hyperlipidemia   . Hypertension   . Myocardial infarction ([place])    X2  . Peripheral vascular disease ([place])   . Pneumonia    HX OF   . Renal artery stenosis ([place])   . Sleep apnea    OXYGEN AT NIGHT 2L [place]    Patient Active Problem List   Diagnosis Date Noted  . CHF (congestive heart failure) ([place]) 12/17/2018  . Pressure injury of skin 12/17/2018  . Hypoxemia   . Pulmonary embolism and infarction ([place])   . Acute on chronic respiratory failure with hypoxia ([place])   . SOB (shortness of breath)   . Cellulitis 11/15/2018  . 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 Severe aortic stenosis 09/17/2017  . Mitral stenosis   . Aftercare following surgery of the circulatory system 03/15/2014  . Carotid stenosis 03/15/2014  . Unstable angina ([place]) 08/31/2013  . Acute myocardial infarction, unspecified site, initial episode of care   . Aortic stenosis   . Atrial fibrillation ([place])   . Vertigo 06/27/2013  . Chest pain with moderate risk of acute coronary syndrome 06/27/2013  . CAD - CABG '94, low risk Myoview May 2014 06/27/2013  . Chronic atrial fibrillation 06/27/2013  . Chronic anticoagulation 06/27/2013  . Gout attack- on steroid dose pack 06/27/2013  . Tachycardia- beta blocker increased 06/26/2013  . Essential hypertension, benign 03/10/2009  . HYPERLIPIDEMIA 09/09/2008  . UNSPECIFIED ANEMIA 09/09/2008  . CAROTID STENOSIS- moderate 09/09/2008  .  UNSPECIFIED CEREBROVASCULAR DISEASE 09/09/2008  . RENAL ARTERY STENOSIS- moderate 09/09/2008  . PERIPHERAL VASCULAR DISEASE 09/09/2008  . PEPTIC ULCER DISEASE, HX OF 09/09/2008    Past Surgical History:  Procedure Laterality Date  . AORTIC ARCH ANGIOGRAPHY N/A 08/14/2017   Procedure: AORTIC ARCH ANGIOGRAPHY;  Surgeon: Sherren Mocha, MD;  Location: [facility];  Service: Cardiovascular;  Laterality: N/A;  . CARDIAC CATHETERIZATION    . CAROTID ENDARTERECTOMY    . COLON SURGERY    . CORONARY ARTERY BYPASS GRAFT  1994  . ENDARTERECTOMY Right 01/05/2013   Procedure: ENDARTERECTOMY CAROTID;  Surgeon: Rosetta Posner, MD;  Location: [facility];   Service: Vascular;  Laterality: Right;  . INCISION AND DRAINAGE Left 11/16/2018   Procedure: INCISION AND DRAINAGE LEFT FOREARM/ARM;  Surgeon: Leanora Cover, MD;  Location: [facility];  Service: Orthopedics;  Laterality: Left;  . KNEE SURGERY  08/2003   left knee/ARTHROSCOPIC  . LEFT HEART CATHETERIZATION WITH CORONARY/GRAFT ANGIOGRAM N/A 09/01/2013   Procedure: LEFT HEART CATHETERIZATION WITH Beatrix Fetters;  Surgeon: Sinclair Grooms, MD;  Location[facility] CATH LAB;  Service: Cardiovascular;  Laterality: N/A;  . PATCH ANGIOPLASTY Right 01/05/2013   Procedure: PATCH ANGIOPLASTY;  Surgeon: Rosetta Posner, MD;  Location: [facility];  Service: Vascular;  Laterality: Right;  . PERCUTANEOUS CORONARY STENT INTERVENTION (PCI-S)  09/01/2013   Procedure: PERCUTANEOUS CORONARY STENT INTERVENTION (PCI-S);  Surgeon: Sinclair Grooms, MD;  Location: [facility] CATH LAB;  Service: Cardiovascular;;  . removal of bleeding ulcer  1994  . RENAL ARTERY STENT     stenting of the left renal artery as well as a cutting balloon angioplasty for treatment of in-stent restenosis coronary artery bypass grafting in 1994.  [name] RIGHT/LEFT HEART CATH AND CORONARY/GRAFT ANGIOGRAPHY N/A 08/14/2017   Procedure: RIGHT/LEFT HEART CATH AND CORONARY/GRAFT ANGIOGRAPHY;  Surgeon: Sherren Mocha, MD;  Location: [facility] LAB;  Service: Cardiovascular;  Laterality: N/A;  . TEE WITHOUT CARDIOVERSION N/A 09/17/2017   Procedure: TRANSESOPHAGEAL ECHOCARDIOGRAM (TEE);  Surgeon: Sherren Mocha, MD;  Location: [facility];  Service: Open Heart Surgery;  Laterality: N/A;        Home Medications    Prior to Admission medications   Medication Sig Start Date End Date Taking? Authorizing Provider  acetaminophen-codeine (TYLENOL #3) 300-30 MG tablet Take 1 tablet by mouth 2 (two) times daily as needed for pain. 12/12/18   [provider]  acyclovir (ZOVIRAX) 800 MG tablet Take 400 mg by mouth 2 (two) times daily.     [provider]   allopurinol (ZYLOPRIM) 300 MG tablet Take 300 mg by mouth daily. 10/12/18   [provider]  apixaban (ELIQUIS) 5 MG TABS tablet Take 1 tablet (5 mg total) by mouth 2 (two) times daily. 12/22/18 01/21/19  Lurline Del, MD  atorvastatin (LIPITOR) 80 MG tablet Take 1 tablet (80 mg total) by mouth at bedtime. 12/07/14   Lelon Perla, MD  benazepril (LOTENSIN) 5 MG tablet Take 1 tablet (5 mg total) by mouth daily. 12/23/18   Lurline Del, MD  bisacodyl (DULCOLAX) 5 MG EC tablet Take 1 tablet (5 mg total) by mouth daily. 12/23/18   Lurline Del, MD  brimonidine (ALPHAGAN) 0.2 % ophthalmic solution Place 1 drop into both eyes 2 (two) times daily.    [provider]  budesonide-formoterol (SYMBICORT) 80-4.5 MCG/ACT inhaler Inhale 1 puff into the lungs as needed for shortness of breath. 09/05/18   [provider]  ferrous sulfate 325 (65 FE) MG  tablet Take 325 mg by mouth daily with breakfast.     [provider]  furosemide (LASIX) 40 MG tablet Take 1 tablet (40 mg total) by mouth daily. 12/23/18   Lurline Del, MD  lansoprazole (PREVACID) 30 MG capsule Take 30 mg by mouth daily.      [provider]  levalbuterol Penne Lash) 0.63 MG/3ML nebulizer solution Take 3 mLs (0.63 mg total) by nebulization every 6 (six) hours as needed for wheezing or shortness of breath. 12/22/18   Lurline Del, MD  metFORMIN (GLUCOPHAGE-XR) 500 MG 24 hr tablet Take 500 mg by mouth daily. 12/10/18   [provider]  methocarbamol (ROBAXIN) 750 MG tablet Take 750 mg by mouth 2 (two) times daily as needed for muscle spasms. 11/28/18   [provider]  metoprolol tartrate (LOPRESSOR) 25 MG tablet Take 1 tablet (25 mg total) by mouth 2 (two) times daily. 12/25/18 03/25/19  Lelon Perla, MD  Multiple Vitamin (MULTIVITAMIN WITH MINERALS) TABS Take 1 tablet by mouth daily.    [provider]  polyethylene glycol (MIRALAX / GLYCOLAX) 17 g packet Take 17 g by mouth 2  (two) times daily. 12/22/18   Lurline Del, MD  tamsulosin (FLOMAX) 0.4 MG CAPS capsule Take 1 capsule (0.4 mg total) by mouth daily after supper. 12/22/18   Lurline Del, MD  terazosin (HYTRIN) 2 MG capsule Take 1 capsule (2 mg total) by mouth at bedtime. 12/22/18 01/21/19  Lurline Del, MD  trolamine salicylate (ASPERCREME) 10 % cream Apply 1 application topically 4 (four) times daily as needed for muscle pain.     [provider]  umeclidinium bromide (INCRUSE ELLIPTA) 62.5 MCG/INH AEPB Inhale 1 puff into the lungs daily. 11/27/18   Matilde Haymaker, MD    Family History Family History  Problem Relation Age of Onset  . Coronary artery disease Mother   . Heart disease Mother        Before age 28  . Hyperlipidemia Mother   . Hypertension Mother   . Heart attack Mother   . Coronary artery disease Father   . Heart disease Father        After age 32  . Heart attack Father   . Heart disease Sister        After age 31  . Heart disease Brother        After age 63  . Coronary artery disease Other        13 sibling, almost all have coronary disease and some with premature onset  . Heart disease Other        13 sibling, almost all have coronary disease and some with premature onset    Social History Social History   Tobacco Use  . Smoking status: Former Smoker    Quit date: 04/27/1993    Years since quitting: 25.6  . Smokeless tobacco: Never Used  Substance Use Topics  . Alcohol use: No    Alcohol/week: 0.0 standard drinks  . Drug use: No     Allergies   Bactrim [sulfamethoxazole-trimethoprim] and Levaquin [levofloxacin in d5w]   Review of Systems Review of Systems  Constitutional: Positive for fatigue. Negative for chills, diaphoresis and fever.  HENT: Negative for congestion, rhinorrhea and sneezing.   Eyes: Negative.   Respiratory: Positive for cough and shortness of breath. Negative for chest tightness.   Cardiovascular: Negative for chest pain and leg swelling.   Gastrointestinal: Negative for abdominal pain, blood in stool, diarrhea, nausea and vomiting.  Genitourinary:  Negative for difficulty urinating, flank pain, frequency and hematuria.  Musculoskeletal: Negative for arthralgias and back pain.  Skin: Negative for rash.  Neurological: Negative for dizziness, speech difficulty, weakness, numbness and headaches.     Physical Exam Updated Vital Signs BP 126/79 (BP Location: Right Arm) Comment: Simultaneous filing. User may not have seen previous data.  Pulse (!) 101 Comment: Simultaneous filing. User may not have seen previous data.  Temp 98.2 F (36.8 C) (Oral)   Resp (!) 23 Comment: Simultaneous filing. User may not have seen previous data.  SpO2 100% Comment: Simultaneous filing. User may not have seen previous data.  Physical Exam Constitutional:      Appearance: He is well-developed.  HENT:     Head: Normocephalic and atraumatic.  Eyes:     Pupils: Pupils are equal, round, and reactive to light.  Neck:     Musculoskeletal: Normal range of motion and neck supple.  Cardiovascular:     Rate and Rhythm: Normal rate and regular rhythm.     Heart sounds: Normal heart sounds.  Pulmonary:     Effort: Tachypnea and accessory muscle usage present. No respiratory distress.     Breath sounds: Rales present. No wheezing.  Chest:     Chest wall: No tenderness.  Abdominal:     General: Bowel sounds are normal.     Palpations: Abdomen is soft.     Tenderness: There is no abdominal tenderness. There is no guarding or rebound.  Musculoskeletal: Normal range of motion.     Right lower leg: No edema.     Left lower leg: No edema.  Lymphadenopathy:     Cervical: No cervical adenopathy.  Skin:    General: Skin is warm and dry.     Findings: No rash.  Neurological:     Mental Status: He is alert and oriented to person, place, and time.      ED Treatments / Results  Labs (all labs ordered are listed, but only abnormal results are  displayed) Labs Reviewed  BASIC METABOLIC PANEL  CBC WITH DIFFERENTIAL/PLATELET  BRAIN NATRIURETIC PEPTIDE  PROTIME-INR  TROPONIN I (HIGH SENSITIVITY)    EKG EKG Interpretation  Date/Time:  Wednesday December 31 2018 13:53:55 EDT Ventricular Rate:  100 PR Interval:    QRS Duration: 165 QT Interval:  386 QTC Calculation: 498 R Axis:   39 Text Interpretation:  Age not entered, assumed to be  85 years old for purpose of ECG interpretation Atrial fibrillation IVCD, consider atypical LBBB ST depression laterally Confirmed by Malvin Johns [id number]) on 12/31/2018 2:34:39 PM   Radiology Dg Chest Port 1 View  Result Date: 12/31/2018 CLINICAL DATA:  Shortness of breath EXAM: PORTABLE CHEST 1 VIEW COMPARISON:  12/19/2018 FINDINGS: No significant change in mild, diffuse bilateral interstitial pulmonary opacity, small layering bilateral pleural effusions, and gross cardiomegaly. There is no new or focal airspace opacity. IMPRESSION: No significant change in mild, diffuse bilateral interstitial pulmonary opacity, small layering bilateral pleural effusions, and gross cardiomegaly. There is no new or focal airspace opacity. Electronically Signed   By: Eddie Candle M.D.   On: 12/31/2018 14:43    Procedures Procedures (including critical care time)  Medications Ordered in ED Medications  albuterol (VENTOLIN HFA) 108 (90 Base) MCG/ACT inhaler 4 puff (has no administration in time range)     Initial Impression / Assessment and Plan / ED Course  I have reviewed the triage vital signs and the nursing notes.  Pertinent labs & imaging results that  were available during my care of the patient were reviewed by me and considered in my medical decision making (see chart for details).        Patient is 85 year old male with a history of CHF and COPD who presents with shortness of breath.  He has had prior PE but he had a recent CT scan that was negative for PE on July 15.  He was having similar symptoms  at that point.  His chest x-ray shows some findings similar to his prior EKGs with some interstitial markings and small pleural effusion.  He is maintaining his oxygen saturations on a nasal cannula.  He has some mild tachypnea.  I will try an albuterol inhaler to see if this improves his symptoms.  Labs are still pending.  Care turned over to Dr. Jeanell Sparrow pending labs.    Final Clinical Impressions(s) / ED Diagnoses   Final diagnoses:  None    ED Discharge Orders    None       Malvin Johns, MD 12/31/18 1453

## 2018-12-31 NOTE — ED Notes (Addendum)
ED TO INPATIENT HANDOFF REPORT  ED Nurse Name and Phone #: William Hamburger, [place]  S Name/Age/Gender [name] 85 y.o. male Room/Bed: 024C/024C  Code Status   Code Status: Prior  Home/SNF/Other Nursing Home Patient oriented to: situation Is this baseline? No   Triage Complete: Triage complete  Chief Complaint Resp distress  Triage Note Pt back from [place] [facility] home with c/o sob on 3 liters otc , pt bumped up to 4 liters by EMS , pt has history of chf    Allergies Allergies  Allergen Reactions  . Bactrim [Sulfamethoxazole-Trimethoprim] Other (See Comments)    "Allergic," per MAR  . Levaquin [Levofloxacin In D5w] Diarrhea and Other (See Comments)    "Allergic," per MAR    Level of Care/Admitting Diagnosis ED Disposition    None      B Medical/Surgery History Past Medical History:  Diagnosis Date  . Acute myocardial infarction, unspecified site, initial episode of care   . Anemia   . Anxiety   . Aortic stenosis    . Atrial fibrillation ([place])    a. permanent, on Coumadin for anticoagulation  . CAD (coronary artery disease)    a. s/p CABG in 1994 b. cath in 2015 showing normal LM, 100% LCx and RCA stenosis with 50-60% prox LAD stenosis and 100% mid-LAD stenosis; patent sequential SVG-OM2-OM3 and patent LIMA-LAD with PTCA of distal LAD via LIMA graft performed at that time  . Carotid stenosis   . CHF (congestive heart failure) ([place])   . COPD (chronic obstructive pulmonary disease) ([place])   . Depression   . Diabetes mellitus without complication ([place])    FASTING 98-120S  . GERD (gastroesophageal reflux disease)   . History of blood transfusion   . History of peptic ulcer disease   . Hyperlipidemia   . Hypertension   . Myocardial infarction ([place])    X2  . Peripheral vascular disease ([place])   . Pneumonia    HX OF  . Renal artery stenosis ([place])   . Sleep apnea    OXYGEN AT NIGHT 2L [place]   Past Surgical History:  Procedure Laterality Date  . AORTIC  ARCH ANGIOGRAPHY N/A 08/14/2017   Procedure: AORTIC ARCH ANGIOGRAPHY;  Surgeon: Sherren Mocha, MD;  Location: [facility];  Service: Cardiovascular;  Laterality: N/A;  . CARDIAC CATHETERIZATION    . CAROTID ENDARTERECTOMY    . COLON SURGERY    . CORONARY ARTERY BYPASS GRAFT  1994  . ENDARTERECTOMY Right 01/05/2013   Procedure: ENDARTERECTOMY CAROTID;  Surgeon: Rosetta Posner, MD;  Location: [facility];  Service: Vascular;  Laterality: Right;  . INCISION AND DRAINAGE Left 11/16/2018   Procedure: INCISION AND DRAINAGE LEFT FOREARM/ARM;  Surgeon: Leanora Cover, MD;  Location: [facility];  Service: Orthopedics;  Laterality: Left;  . KNEE SURGERY  08/2003   left knee/ARTHROSCOPIC  . LEFT HEART CATHETERIZATION WITH CORONARY/GRAFT ANGIOGRAM N/A 09/01/2013   Procedure: LEFT HEART CATHETERIZATION WITH Beatrix Fetters;  Surgeon: Sinclair Grooms, MD;  Location: [facility] C[facility];  Service: Cardiovascular;  Laterality: N/A;  . PATCH ANGIOPLASTY Right 01/05/2013   Procedure: PATCH ANGIOPLASTY;  Surgeon: Rosetta Posner, MD;  Location: [facility];  Service: Vascular;  Laterality: Right;  . PERCUTANEOUS CORONARY STENT INTERVENTION (PCI-S)  09/01/2013   Procedure: PERCUTANEOUS CORONARY STENT INTERVENTION (PCI-S);  Surgeon: Sinclair Grooms, MD;  Location: [facility] LAB;  Service: Cardiovascular;;  . removal of bleeding ulcer  1994  . RENAL ARTERY STENT  stenting of the left renal artery as well as a cutting balloon angioplasty for treatment of in-stent restenosis coronary artery bypass grafting in 1994.  [name] RIGHT/LEFT HEART CATH AND CORONARY/GRAFT ANGIOGRAPHY N/A 08/14/2017   Procedure: RIGHT/LEFT HEART CATH AND CORONARY/GRAFT ANGIOGRAPHY;  Surgeon: Sherren Mocha, MD;  Location: [place];  Service: Cardiovascular;  Laterality: N/A;  . TEE WITHOUT CARDIOVERSION N/A 09/17/2017   Procedure: TRANSESOPHAGEAL ECHOCARDIOGRAM (TEE);  Surgeon: Sherren Mocha, MD;  Location: [facility];  Service: Open Heart Surgery;   Laterality: N/A;     A IV Location/Drains/Wounds Patient Lines/Drains/Airways Status   Active Line/Drains/Airways    Name:   Placement date:   Placement time:   Site:   Days:   Peripheral IV 12/31/18 Left Hand   12/31/18    1357    Hand   less than 1   Urethral Catheter Idolina Primer, RN 16 Fr.   12/22/18    1626    -   9   Pressure Injury 12/17/18 Buttocks Right Stage II -  Partial thickness loss of dermis presenting as a shallow open ulcer with a red, pink wound bed without slough.   12/17/18    1430     14   Pressure Injury 12/17/18 Heel Right Unstageable - Full thickness tissue loss in which the base of the ulcer is covered by slough (yellow, tan, gray, green or brown) and/or eschar (tan, brown or black) in the wound bed.   12/17/18    1430     14   Wound / Incision (Open or Dehisced) 11/19/18 Other (Comment) Arm Left left dorsal cellulitic wound post I and D   11/19/18    -    Arm   42   Wound / Incision (Open or Dehisced) 11/19/18 Other (Comment) Arm Left ventral forearm wound post I and D   11/19/18    -    Arm   42          Intake/Output Last 24 hours No intake or output data in the 24 hours ending 12/31/18 1851  Labs/Imaging Results for orders placed or performed during the hospital encounter of 12/31/18 (from the past 48 hour(s))  Basic metabolic panel     Status: Abnormal   Collection Time: 12/31/18  3:15 PM  Result Value Ref Range   Sodium 134 (L) 135 - 145 mmol/L   Potassium 4.0 3.5 - 5.1 mmol/L   Chloride 96 (L) 98 - 111 mmol/L   CO2 29 22 - 32 mmol/L   Glucose, Bld 140 (H) 70 - 99 mg/dL   BUN 15 8 - 23 mg/dL   Creatinine, Ser 0.63 0.61 - 1.24 mg/dL   Calcium 8.6 (L) 8.9 - 10.3 mg/dL   GFR calc non Af Amer >60 >60 mL/min   GFR calc Af Amer >60 >60 mL/min   Anion gap 9 5 - 15    Comment: Performed at [facility], [address] [place], [place] [address]  CBC with Differential     Status: Abnormal   Collection Time: 12/31/18  3:15 PM  Result Value Ref Range    WBC 7.1 4.0 - 10.5 K/uL   RBC 3.20 (L) 4.22 - 5.81 MIL/uL   Hemoglobin 9.0 (L) 13.0 - 17.0 g/dL   HCT 28.3 (L) 39.0 - 52.0 %   MCV 88.4 80.0 - 100.0 fL   MCH 28.1 26.0 - 34.0 pg   MCHC 31.8 30.0 - 36.0 g/dL   RDW 16.1 (  H) 11.5 - 15.5 %   Platelets 288 150 - 400 K/uL   nRBC 0.0 0.0 - 0.2 %   Neutrophils Relative % 75 %   Neutro Abs 5.3 1.7 - 7.7 K/uL   Lymphocytes Relative 9 %   Lymphs Abs 0.6 (L) 0.7 - 4.0 K/uL   Monocytes Relative 13 %   Monocytes Absolute 0.9 0.1 - 1.0 K/uL   Eosinophils Relative 2 %   Eosinophils Absolute 0.1 0.0 - 0.5 K/uL   Basophils Relative 1 %   Basophils Absolute 0.0 0.0 - 0.1 K/uL   Immature Granulocytes 0 %   Abs Immature Granulocytes 0.03 0.00 - 0.07 K/uL    Comment: Performed at [facility] [address], [place], [place] [address]  Brain natriuretic peptide     Status: Abnormal   Collection Time: 12/31/18  3:15 PM  Result Value Ref Range   B Natriuretic Peptide 305.1 (H) 0.0 - 100.0 pg/mL    Comment: Performed at [facility] [address] [place], [place] [address]  Protime-INR     Status: Abnormal   Collection Time: 12/31/18  3:15 PM  Result Value Ref Range   Prothrombin Time 21.7 (H) 11.4 - 15.2 seconds   INR 1.9 (H) 0.8 - 1.2    Comment: (NOTE) INR goal varies based on device and disease states. Performed at [facility], [place] [address] [place], [place] [address]   Troponin I (High Sensitivity)     Status: Abnormal   Collection Time: 12/31/18  3:15 PM  Result Value Ref Range   Troponin I (High Sensitivity) 27 (H) <18 ng/L    Comment: (NOTE) Elevated high sensitivity troponin I (hsTnI) values and significant  changes across serial measurements may suggest ACS but many other  chronic and acute conditions are known to elevate hsTnI results.  Refer to the "Links" section for chest pain algorithms and additional  guidance. Performed at [facility], [place] [address] [place], [place] [address]   Troponin  I (High Sensitivity)     Status: Abnormal   Collection Time: 12/31/18  4:35 PM  Result Value Ref Range   Troponin I (High Sensitivity) 34 (H) <18 ng/L    Comment: (NOTE) Elevated high sensitivity troponin I (hsTnI) values and significant  changes across serial measurements may suggest ACS but many other  chronic and acute conditions are known to elevate hsTnI results.  Refer to the "Links" section for chest pain algorithms and additional  guidance. Performed at [facility], [place] [address] [place], [place] [address]   SARS Coronavirus 2 (CEPHEID- Performed in [facility] hospital lab), Hosp Order     Status: None   Collection Time: 12/31/18  5:00 PM   Specimen: Nasopharyngeal Swab  Result Value Ref Range   SARS Coronavirus 2 NEGATIVE NEGATIVE    Comment: (NOTE) If result is NEGATIVE SARS-CoV-2 target nucleic acids are NOT DETECTED. The SARS-CoV-2 RNA is generally detectable in upper and lower  respiratory specimens during the acute phase of infection. The lowest  concentration of SARS-CoV-2 viral copies this assay can detect is 250  copies / mL. A negative result does not preclude SARS-CoV-2 infection  and should not be used as the sole basis for treatment or other  patient management decisions.  A negative result may occur with  improper specimen collection / handling, submission of specimen other  than nasopharyngeal swab, presence of viral mutation(s) within the  areas targeted by  this assay, and inadequate number of viral copies  (<250 copies / mL). A negative result must be combined with clinical  observations, patient history, and epidemiological information. If result is POSITIVE SARS-CoV-2 target nucleic acids are DETECTED. The SARS-CoV-2 RNA is generally detectable in upper and lower  respiratory specimens dur ing the acute phase of infection.  Positive  results are indicative of active infection with SARS-CoV-2.  Clinical  correlation with patient history and  other diagnostic information is  necessary to determine patient infection status.  Positive results do  not rule out bacterial infection or co-infection with other viruses. If result is PRESUMPTIVE POSTIVE SARS-CoV-2 nucleic acids MAY BE PRESENT.   A presumptive positive result was obtained on the submitted specimen  and confirmed on repeat testing.  While 2019 novel coronavirus  (SARS-CoV-2) nucleic acids may be present in the submitted sample  additional confirmatory testing may be necessary for epidemiological  and / or clinical management purposes  to differentiate between  SARS-CoV-2 and other Sarbecovirus currently known to infect humans.  If clinically indicated additional testing with an alternate test  methodology [phone]) is advised. The SARS-CoV-2 RNA is generally  detectable in upper and lower respiratory sp ecimens during the acute  phase of infection. The expected result is Negative. Fact Sheet for Patients:  StrictlyIdeas.no Fact Sheet for Healthcare Providers: BankingDealers.co.za This test is not yet approved or cleared by the [place] FDA and has been authorized for detection and/or diagnosis of SARS-CoV-2 by FDA under an Emergency Use Authorization (EUA).  This EUA will remain in effect (meaning this test can be used) for the duration of the COVID-19 declaration under Section 564(b)(1) of the Act, 21 U.S.C. section 360bbb-3(b)(1), unless the authorization is terminated or revoked sooner. Performed at [facility], [place] [address], [place], [place] [address]    Dg Chest Port 1 View  Result Date: 12/31/2018 CLINICAL DATA:  Shortness of breath EXAM: PORTABLE CHEST 1 VIEW COMPARISON:  12/19/2018 FINDINGS: No significant change in mild, diffuse bilateral interstitial pulmonary opacity, small layering bilateral pleural effusions, and gross cardiomegaly. There is no new or focal airspace opacity. IMPRESSION: No  significant change in mild, diffuse bilateral interstitial pulmonary opacity, small layering bilateral pleural effusions, and gross cardiomegaly. There is no new or focal airspace opacity. Electronically Signed   By: Eddie Candle M.D.   On: 12/31/2018 14:43    Pending Labs Unresulted Labs (From admission, onward)   None      Vitals/Pain Today's Vitals   12/31/18 1715 12/31/18 1730 12/31/18 1745 12/31/18 1800  BP: (!) 131/54 (!) 115/59 124/67 137/61  Pulse: 91 94 98 (!) 101  Resp: (!) 34 (!) 35 (!) 27 (!) 32  Temp:      TempSrc:      SpO2: 98% 100% 99% 98%  PainSc:        Isolation Precautions Airborne and Contact precautions  Medications Medications  albuterol (VENTOLIN HFA) 108 (90 Base) MCG/ACT inhaler 2 puff (has no administration in time range)  albuterol (VENTOLIN HFA) 108 (90 Base) MCG/ACT inhaler 4 puff (4 puffs Inhalation Given 12/31/18 1530)  methylPREDNISolone sodium succinate (SOLU-MEDROL) 125 mg/2 mL injection 125 mg (125 mg Intravenous Given 12/31/18 1705)    Mobility walks with person assist Moderate fall risk   Focused Assessments Pulmonary Assessment Handoff:  Lung sounds: Bilateral Breath Sounds: Diminished L Breath Sounds: Diminished R Breath Sounds: Diminished O2 Device: Nasal Cannula O2 Flow Rate (L/min): 4 L/min  R Recommendations: See Admitting Provider Note  Report given to:   Additional Notes:

## 2019-01-01 ENCOUNTER — Encounter (HOSPITAL_COMMUNITY): Payer: Self-pay

## 2019-01-01 ENCOUNTER — Inpatient Hospital Stay (HOSPITAL_COMMUNITY): Payer: Medicare Other

## 2019-01-01 ENCOUNTER — Other Ambulatory Visit: Payer: Self-pay

## 2019-01-01 DIAGNOSIS — Z9889 Other specified postprocedural states: Secondary | ICD-10-CM

## 2019-01-01 DIAGNOSIS — I340 Nonrheumatic mitral (valve) insufficiency: Secondary | ICD-10-CM

## 2019-01-01 DIAGNOSIS — J9601 Acute respiratory failure with hypoxia: Secondary | ICD-10-CM

## 2019-01-01 DIAGNOSIS — R0600 Dyspnea, unspecified: Secondary | ICD-10-CM

## 2019-01-01 DIAGNOSIS — J441 Chronic obstructive pulmonary disease with (acute) exacerbation: Secondary | ICD-10-CM

## 2019-01-01 DIAGNOSIS — I5023 Acute on chronic systolic (congestive) heart failure: Secondary | ICD-10-CM

## 2019-01-01 DIAGNOSIS — I342 Nonrheumatic mitral (valve) stenosis: Secondary | ICD-10-CM

## 2019-01-01 DIAGNOSIS — I361 Nonrheumatic tricuspid (valve) insufficiency: Secondary | ICD-10-CM

## 2019-01-01 LAB — CBC WITH DIFFERENTIAL/PLATELET
Abs Immature Granulocytes: 0.02 10*3/uL (ref 0.00–0.07)
Basophils Absolute: 0 10*3/uL (ref 0.0–0.1)
Basophils Relative: 0 %
Eosinophils Absolute: 0 10*3/uL (ref 0.0–0.5)
Eosinophils Relative: 0 %
HCT: 31.9 % — ABNORMAL LOW (ref 39.0–52.0)
Hemoglobin: 10 g/dL — ABNORMAL LOW (ref 13.0–17.0)
Immature Granulocytes: 1 %
Lymphocytes Relative: 11 %
Lymphs Abs: 0.3 10*3/uL — ABNORMAL LOW (ref 0.7–4.0)
MCH: 27.7 pg (ref 26.0–34.0)
MCHC: 31.3 g/dL (ref 30.0–36.0)
MCV: 88.4 fL (ref 80.0–100.0)
Monocytes Absolute: 0.2 10*3/uL (ref 0.1–1.0)
Monocytes Relative: 5 %
Neutro Abs: 2.7 10*3/uL (ref 1.7–7.7)
Neutrophils Relative %: 83 %
Platelets: 314 10*3/uL (ref 150–400)
RBC: 3.61 MIL/uL — ABNORMAL LOW (ref 4.22–5.81)
RDW: 16.1 % — ABNORMAL HIGH (ref 11.5–15.5)
WBC: 3.2 10*3/uL — ABNORMAL LOW (ref 4.0–10.5)
nRBC: 0 % (ref 0.0–0.2)

## 2019-01-01 LAB — SURGICAL PCR SCREEN
MRSA, PCR: NEGATIVE
Staphylococcus aureus: NEGATIVE

## 2019-01-01 LAB — COMPREHENSIVE METABOLIC PANEL
ALT: 36 U/L (ref 0–44)
AST: 30 U/L (ref 15–41)
Albumin: 2.8 g/dL — ABNORMAL LOW (ref 3.5–5.0)
Alkaline Phosphatase: 160 U/L — ABNORMAL HIGH (ref 38–126)
Anion gap: 13 (ref 5–15)
BUN: 15 mg/dL (ref 8–23)
CO2: 27 mmol/L (ref 22–32)
Calcium: 8.9 mg/dL (ref 8.9–10.3)
Chloride: 97 mmol/L — ABNORMAL LOW (ref 98–111)
Creatinine, Ser: 0.77 mg/dL (ref 0.61–1.24)
GFR calc Af Amer: >60 mL/min (ref >60)
GFR calc non Af Amer: >60 mL/min (ref >60)
Glucose, Bld: 183 mg/dL — ABNORMAL HIGH (ref 70–99)
Potassium: 4.1 mmol/L (ref 3.5–5.1)
Sodium: 137 mmol/L (ref 135–145)
Total Bilirubin: 1.1 mg/dL (ref 0.3–1.2)
Total Protein: 5.8 g/dL — ABNORMAL LOW (ref 6.5–8.1)

## 2019-01-01 LAB — BLOOD GAS, ARTERIAL
Acid-Base Excess: 6.4 mmol/L — ABNORMAL HIGH (ref 0.0–2.0)
Bicarbonate: 29.3 mmol/L — ABNORMAL HIGH (ref 20.0–28.0)
Delivery systems: POSITIVE
Drawn by: 56037
Expiratory PAP: 6
FIO2: 40
Inspiratory PAP: 12
O2 Saturation: 99.6 %
Patient temperature: 96.6
RATE: 12 resp/min
pCO2 arterial: 31.8 mmHg — ABNORMAL LOW (ref 32.0–48.0)
pH, Arterial: 7.567 — ABNORMAL HIGH (ref 7.350–7.450)
pO2, Arterial: 133 mmHg — ABNORMAL HIGH (ref 83.0–108.0)

## 2019-01-01 LAB — TROPONIN I (HIGH SENSITIVITY)
Troponin I (High Sensitivity): 27 ng/L — ABNORMAL HIGH (ref <18)
Troponin I (High Sensitivity): 26 ng/L — ABNORMAL HIGH (ref <18)
Troponin I (High Sensitivity): 22 ng/L — ABNORMAL HIGH (ref <18)

## 2019-01-01 LAB — URINALYSIS, ROUTINE W REFLEX MICROSCOPIC

## 2019-01-01 LAB — GLUCOSE, CAPILLARY
Glucose-Capillary: 178 mg/dL — ABNORMAL HIGH (ref 70–99)
Glucose-Capillary: 176 mg/dL — ABNORMAL HIGH (ref 70–99)
Glucose-Capillary: 177 mg/dL — ABNORMAL HIGH (ref 70–99)
Glucose-Capillary: 176 mg/dL — ABNORMAL HIGH (ref 70–99)

## 2019-01-01 LAB — ECHOCARDIOGRAM COMPLETE
Height: 72 in
Weight: 3079.39 oz

## 2019-01-01 LAB — URINALYSIS, MICROSCOPIC (REFLEX): Squamous Epithelial / HPF: NONE SEEN (ref 0–5)

## 2019-01-01 MED ORDER — TAMSULOSIN HCL 0.4 MG PO CAPS
0.4000 mg | ORAL_CAPSULE | Freq: Every evening | ORAL | Status: DC
  Administered 2019-01-01: 0.4 mg via ORAL
  Filled 2019-01-01: qty 1

## 2019-01-01 MED ORDER — METOPROLOL TARTRATE 12.5 MG HALF TABLET: 12.5000 mg | ORAL_TABLET | Freq: Two times a day (BID) | ORAL | Status: DC

## 2019-01-01 MED ORDER — ONDANSETRON HCL 4 MG/2ML IJ SOLN: 4.0000 mg | Freq: Four times a day (QID) | INTRAMUSCULAR | Status: DC | PRN

## 2019-01-01 MED ORDER — INSULIN ASPART 100 UNIT/ML ~~LOC~~ SOLN
0.0000 [IU] | Freq: Three times a day (TID) | SUBCUTANEOUS | Status: DC
  Administered 2019-01-01 (×3): 2 [IU] via SUBCUTANEOUS
  Administered 2019-01-02: 1 [IU] via SUBCUTANEOUS
  Administered 2019-01-02: 13:00:00 2 [IU] via SUBCUTANEOUS
  Administered 2019-01-03: 1 [IU] via SUBCUTANEOUS
  Administered 2019-01-03 – 2019-01-04 (×2): 2 [IU] via SUBCUTANEOUS
  Administered 2019-01-04 – 2019-01-06 (×3): 1 [IU] via SUBCUTANEOUS

## 2019-01-01 MED ORDER — ATORVASTATIN CALCIUM 80 MG PO TABS
80.0000 mg | ORAL_TABLET | Freq: Every day | ORAL | Status: DC
  Administered 2019-01-01 – 2019-01-05 (×5): 80 mg via ORAL
  Filled 2019-01-01 (×5): qty 1

## 2019-01-01 MED ORDER — PANTOPRAZOLE SODIUM 20 MG PO TBEC
20.0000 mg | DELAYED_RELEASE_TABLET | Freq: Every day | ORAL | Status: DC
  Administered 2019-01-01 – 2019-01-06 (×6): 20 mg via ORAL
  Filled 2019-01-01 (×6): qty 1

## 2019-01-01 MED ORDER — ALBUTEROL SULFATE (2.5 MG/3ML) 0.083% IN NEBU: 2.5000 mg | INHALATION_SOLUTION | RESPIRATORY_TRACT | Status: DC | PRN

## 2019-01-01 MED ORDER — DOXYCYCLINE HYCLATE 100 MG PO TABS
100.0000 mg | ORAL_TABLET | Freq: Two times a day (BID) | ORAL | Status: DC
  Administered 2019-01-01 – 2019-01-03 (×5): 100 mg via ORAL
  Filled 2019-01-01 (×5): qty 1

## 2019-01-01 MED ORDER — METOPROLOL TARTRATE 5 MG/5ML IV SOLN
2.5000 mg | Freq: Four times a day (QID) | INTRAVENOUS | Status: DC
  Administered 2019-01-01: 2.5 mg via INTRAVENOUS
  Filled 2019-01-01: qty 5

## 2019-01-01 MED ORDER — PREDNISONE 5 MG PO TABS
5.0000 mg | ORAL_TABLET | Freq: Every day | ORAL | Status: DC
  Administered 2019-01-01: 5 mg via ORAL
  Filled 2019-01-01: qty 1

## 2019-01-01 MED ORDER — GLUCERNA SHAKE PO LIQD
237.0000 mL | Freq: Three times a day (TID) | ORAL | Status: DC
  Administered 2019-01-01 – 2019-01-05 (×14): 237 mL via ORAL

## 2019-01-01 MED ORDER — APIXABAN 5 MG PO TABS
5.0000 mg | ORAL_TABLET | Freq: Two times a day (BID) | ORAL | Status: DC
  Administered 2019-01-01 – 2019-01-06 (×11): 5 mg via ORAL
  Filled 2019-01-01 (×11): qty 1

## 2019-01-01 MED ORDER — SODIUM CHLORIDE 0.9% FLUSH
3.0000 mL | Freq: Two times a day (BID) | INTRAVENOUS | Status: DC
  Administered 2019-01-01 – 2019-01-06 (×11): 3 mL via INTRAVENOUS

## 2019-01-01 MED ORDER — FUROSEMIDE 10 MG/ML IJ SOLN
40.0000 mg | Freq: Three times a day (TID) | INTRAMUSCULAR | Status: AC
  Administered 2019-01-01 – 2019-01-03 (×7): 40 mg via INTRAVENOUS
  Filled 2019-01-01 (×8): qty 4

## 2019-01-01 MED ORDER — LIDOCAINE 5 % EX PTCH
1.0000 | MEDICATED_PATCH | CUTANEOUS | Status: DC
  Filled 2019-01-01: qty 1

## 2019-01-01 MED ORDER — SODIUM CHLORIDE 0.9 % IV SOLN: 250.0000 mL | INTRAVENOUS | Status: DC | PRN

## 2019-01-01 MED ORDER — BRIMONIDINE TARTRATE 0.2 % OP SOLN
1.0000 [drp] | Freq: Two times a day (BID) | OPHTHALMIC | Status: DC
  Administered 2019-01-01 – 2019-01-06 (×11): 1 [drp] via OPHTHALMIC
  Filled 2019-01-01: qty 5

## 2019-01-01 MED ORDER — SPIRONOLACTONE 12.5 MG HALF TABLET
12.5000 mg | ORAL_TABLET | Freq: Every day | ORAL | Status: DC
  Administered 2019-01-01: 12.5 mg via ORAL
  Filled 2019-01-01: qty 1

## 2019-01-01 MED ORDER — MUPIROCIN 2 % EX OINT
1.0000 "application " | TOPICAL_OINTMENT | Freq: Two times a day (BID) | CUTANEOUS | Status: DC
  Administered 2019-01-01 (×2): 1 via NASAL
  Filled 2019-01-01 (×2): qty 22

## 2019-01-01 MED ORDER — BENAZEPRIL HCL 5 MG PO TABS
5.0000 mg | ORAL_TABLET | Freq: Every day | ORAL | Status: DC
  Administered 2019-01-01 – 2019-01-06 (×5): 5 mg via ORAL
  Filled 2019-01-01 (×6): qty 1

## 2019-01-01 MED ORDER — SPIRONOLACTONE 25 MG PO TABS
25.0000 mg | ORAL_TABLET | Freq: Every day | ORAL | Status: DC
  Administered 2019-01-02: 10:00:00 25 mg via ORAL
  Filled 2019-01-01: qty 1

## 2019-01-01 MED ORDER — SODIUM CHLORIDE 0.9% FLUSH: 3.0000 mL | INTRAVENOUS | Status: DC | PRN

## 2019-01-01 MED ORDER — LEVALBUTEROL HCL 0.63 MG/3ML IN NEBU
0.6300 mg | INHALATION_SOLUTION | RESPIRATORY_TRACT | Status: DC
  Administered 2019-01-01 – 2019-01-02 (×9): 0.63 mg via RESPIRATORY_TRACT
  Filled 2019-01-01 (×9): qty 3

## 2019-01-01 MED ORDER — IPRATROPIUM BROMIDE 0.02 % IN SOLN
0.5000 mg | RESPIRATORY_TRACT | Status: DC
  Administered 2019-01-01 – 2019-01-02 (×8): 0.5 mg via RESPIRATORY_TRACT
  Filled 2019-01-01 (×8): qty 2.5

## 2019-01-01 MED ORDER — PREDNISONE 20 MG PO TABS
40.0000 mg | ORAL_TABLET | Freq: Every day | ORAL | Status: DC
  Administered 2019-01-01: 13:00:00 40 mg via ORAL
  Filled 2019-01-01: qty 2

## 2019-01-01 MED ORDER — ALLOPURINOL 300 MG PO TABS
300.0000 mg | ORAL_TABLET | Freq: Every day | ORAL | Status: DC
  Administered 2019-01-01 – 2019-01-06 (×6): 300 mg via ORAL
  Filled 2019-01-01 (×6): qty 1

## 2019-01-01 MED ORDER — FUROSEMIDE 10 MG/ML IJ SOLN
40.0000 mg | Freq: Two times a day (BID) | INTRAMUSCULAR | Status: DC
  Administered 2019-01-01: 40 mg via INTRAVENOUS
  Filled 2019-01-01: qty 4

## 2019-01-01 MED ORDER — METOPROLOL TARTRATE 25 MG PO TABS
25.0000 mg | ORAL_TABLET | Freq: Two times a day (BID) | ORAL | Status: DC
  Administered 2019-01-01 – 2019-01-02 (×2): 25 mg via ORAL
  Filled 2019-01-01 (×2): qty 1

## 2019-01-01 MED ORDER — ACETAMINOPHEN 325 MG PO TABS
650.0000 mg | ORAL_TABLET | ORAL | Status: DC | PRN
  Administered 2019-01-01 – 2019-01-03 (×4): 650 mg via ORAL
  Filled 2019-01-01 (×5): qty 2

## 2019-01-01 NOTE — Procedures (Signed)
Thoracentesis Procedure Note  Pre-operative Diagnosis: effusion, dyspnea  Post-operative Diagnosis: same  Indications: effusion, dyspnea  Procedure Details  Consent: Informed consent was obtained. Risks of the procedure were discussed including: infection, bleeding, pain, pneumothorax.  Under sterile conditions the patient was positioned. Betadine solution and sterile drapes were utilized.  1% buffered lidocaine was used to anesthetize the 8th rib space. Fluid was obtained without any difficulties and minimal blood loss.  A dressing was applied to the wound and wound care instructions were provided.   Findings 1200 ml of clear pleural fluid was obtained. Sample was not sent.  Complications:  None; patient tolerated the procedure well.          Condition: stable  Plan A follow up chest x-ray was ordered.

## 2019-01-01 NOTE — Progress Notes (Signed)
Family Medicine Teaching Service Daily Progress Note   Patient name: [name] Medical record number: [id number] Date of birth: November 27, 1931 Age: 85 y.o. Gender: male  Primary Care Provider: Nicoletta Dress, MD Consultants: none Code Status: Full  Pt Overview and Major Events to Date:  Admitted 7/29  Assessment and Plan: [name] is a 85 y.o. male presenting with shortness of breath, felt to be likely multifactorial 2/2 COPD and CHF. PMH is significant for COPD on Home oxygen 3L, CHF, s/p TAVR, DM Type2, CAD s/p CABG, recent PE, chronic AFIb on Eliquis.   Acute on chronic hypoxic respiratory failure -improving Patient normally on 3 L of oxygen at home.  Recently noted a productive cough with thick yellow sputum and increasing orthopnea.  Patient without fever.  White blood cell count 3.2. -Xopenex/ipratropium nebs q4 scheduled -BiPAP overnight for WOB, will trial off in a few hours (per pt request)  -Keep NPO while on BiPAP -Start Doxycycline 100 mg BID x5 days (7/29-8/2)  -S/p solu medrol in the ED, start prednisone 40mg  in the am (consider tapered course given 3rd recent course 2/2 breathing status)  -Lasix IV 40mg  once 7/29, reassess in am   -Strict I and O's, daily weights 87.3kg 7/30  Atypical chest tightness -resolved Patient complained of some chest tightness with increased effort of breathing on admission.  High-sensitivity troponins of 27/34/27/26.  ED showed A. fib with left bundle branch block that was chronic, as well as downsloping ST depressions in V5/V6.  These were thought to be more secondary to arrhythmia and heart failure as they were present on prior EKGs.  Patient with history of CAD status post CABG in 1994 -Repeat EKG today 7/30 -Continue home atorvastatin 80mg    Persistent A. Fib -somewhat controlled Heart rate in the range of 90s-110 on 7/30 -Patient on apixaban, metoprolol at home  -Cardiac monitoring -Metoprolol IV 2.5 mg every 6  hours  HFpEF: Acute on chronic as discussed above.    Last ECHO (11/24/2018) LVEF 50-55% but LV function not evaluated due to Afib. Home meds Lasix 60 mg daily and Spironolactone 12.5 mg daily. Follows with Dr. Stanford Breed, cardiology.  -Acute management as above  -Repeat ECHO -Continue home Spironolactone 12.5 mg BID when po -BMP daily  COPD: Acute on chronic. Patient normally on 3 L of oxygen at home.  Uses Incruse daily and Symbicort as needed. -Continue management as above -Hold home inhalers -Consider adding a daily LABA to LAMA for baseline control. -Goal O2 sat 88 to 92%  Hypertension-stable BPs overnight 113-142/50s-80.  On home medication of benazepril and metoprolol. -Continue home medications  Type 2 diabetes-stable Last A1c 6.1 (7/15).  On metformin 500 mg daily at home. Monitor blood glucose with meals and at night. -Add sliding scale insulin as patient may need these on steroids -Hold the home metformin while admitted.  GERD-stable Chronic. Patient on Protonix 40 mg daily at home -Continue home meds  BPH-chronic Patient failed a void trial during the last admission and needed Foley catheter placed prior to discharge. -Indwelling catheter in place -Continue home Flomax 0.4 mg  Chronic back pain-stable Takes Norco 5-325 mg every 4 hours as needed for pain at home. -Hold home medication due to respiratory status -Tylenol 650 every 6 as needed -Lidocaine patch -K pad as needed  Recent provoked PE: Stable.  Diagnosed on 6/21 within distal R lower lobar PA. Received IV heparin, ultimately transitioned to treatment dose eliquis. Now down to 5 BID, additional anti-coagulation  for chronic A. Fib. No evidence of PE on CT performed on 7/15.   FEN/GI: NPO PPx: Eliquis  Disposition: Pending medical work-up.  Subjective:  Patient on BiPAP upon my exam this morning.  Was able to talk in short sentences between breaths.  Denies pain.  He stated he did not feel he was  able to come off BiPAP at this time but hoped to be able to remove later today as it is very uncomfortable on his face.  Objective: Temp:  [96.6 F (35.9 C)-98.2 F (36.8 C)] 96.6 F (35.9 C) (07/30 0530) Pulse Rate:  [86-126] 114 (07/30 0430) Resp:  [17-44] 38 (07/30 0530) BP: (113-156)/(50-88) 136/79 (07/30 0530) SpO2:  [96 %-100 %] 96 % (07/30 0530) FiO2 (%):  [40 %] 40 % (07/30 0530) Weight:  [87.3 kg] 87.3 kg (07/30 0530) Physical Exam: General: Alert and oriented in no apparent distress Heart: Irregularly irregular rhythm with no murmurs appreciated. Lungs: Patient on BiPAP.  Expiratory wheezes present on exam left greater than right.  No crackles noted.  Breathing is shallow breaths Abdomen: Bowel sounds present, no abdominal pain Skin: Warm and dry  Laboratory: Recent Labs  Lab 12/31/18 1515  WBC 7.1  HGB 9.0*  HCT 28.3*  PLT 288   Recent Labs  Lab 12/31/18 1515  NA 134*  K 4.0  CL 96*  CO2 29  BUN 15  CREATININE 0.63  CALCIUM 8.6*  GLUCOSE 140*       Imaging/Diagnostic Tests: Ct Angio Chest Pe W And/or Wo Contrast  Result Date: 12/17/2018 CLINICAL DATA:  Shortness of breath. EXAM: CT ANGIOGRAPHY CHEST WITH CONTRAST TECHNIQUE: Multidetector CT imaging of the chest was performed using the standard protocol during bolus administration of intravenous contrast. Multiplanar CT image reconstructions and MIPs were obtained to evaluate the vascular anatomy. CONTRAST:  113mL OMNIPAQUE IOHEXOL 350 MG/ML SOLN COMPARISON:  CT scan of November 23, 2018. FINDINGS: Cardiovascular: Satisfactory opacification of the pulmonary arteries to the segmental level. No evidence of pulmonary embolism. Mild cardiomegaly is noted. Status post transcatheter aortic valve repair. Coronary artery calcifications are noted. Status post coronary bypass graft. Atherosclerosis of thoracic aorta is noted without aneurysm formation. No pericardial effusion. Mediastinum/Nodes: No enlarged mediastinal,  hilar, or axillary lymph nodes. Thyroid gland, trachea, and esophagus demonstrate no significant findings. Lungs/Pleura: No pneumothorax is noted. Moderate size bilateral pleural effusions are noted with adjacent subsegmental atelectasis of both lower lobes. Stable mild left upper lobe subsegmental atelectasis or scarring is noted. Upper Abdomen: No acute abnormality. Musculoskeletal: No chest wall abnormality. No acute or significant osseous findings. Review of the MIP images confirms the above findings. IMPRESSION: No definite evidence of pulmonary embolus. Moderate bilateral pleural effusions are noted with adjacent subsegmental atelectasis. Status post coronary bypass grafting and transcatheter aortic valve replacement. Aortic Atherosclerosis (ICD10-I70.0).1 Electronically Signed   By: Marijo Conception M.D.   On: 12/17/2018 09:49   Dg Chest Port 1 View  Result Date: 12/31/2018 CLINICAL DATA:  Shortness of breath EXAM: PORTABLE CHEST 1 VIEW COMPARISON:  12/19/2018 FINDINGS: No significant change in mild, diffuse bilateral interstitial pulmonary opacity, small layering bilateral pleural effusions, and gross cardiomegaly. There is no new or focal airspace opacity. IMPRESSION: No significant change in mild, diffuse bilateral interstitial pulmonary opacity, small layering bilateral pleural effusions, and gross cardiomegaly. There is no new or focal airspace opacity. Electronically Signed   By: Eddie Candle M.D.   On: 12/31/2018 14:43   Dg Chest Port 1 View  Result Date:  12/19/2018 CLINICAL DATA:  Dyspnea EXAM: PORTABLE CHEST 1 VIEW COMPARISON:  12/17/2018 FINDINGS: Bilateral mild interstitial thickening. Small bilateral pleural effusions. No pneumothorax. Stable cardiomegaly. Prior CABG. Prior TAVR. No acute osseous abnormality. IMPRESSION: 1. Cardiomegaly with pulmonary vascular congestion and small bilateral pleural effusions. Electronically Signed   By: Kathreen Devoid   On: 12/19/2018 09:14   Dg Chest  Portable 1 View  Result Date: 12/17/2018 CLINICAL DATA:  Shortness of breath for 3 days EXAM: PORTABLE CHEST 1 VIEW COMPARISON:  11/22/2018 FINDINGS: Chronic moderate pleural effusions obscuring the lower lungs. Chronic cardiomegaly status post transcatheter aortic valve replacement. No Kerley lines or air bronchogram. No pneumothorax IMPRESSION: Cardiomegaly and moderate pleural effusions also seen on imaging in June 2020. Electronically Signed   By: Monte Fantasia M.D.   On: 12/17/2018 06:31     Lurline Del, MD 01/01/2019, 6:22 AM PGY-1, [place]

## 2019-01-01 NOTE — Progress Notes (Signed)
Initial Nutrition Assessment  DOCUMENTATION CODES:   Not applicable  INTERVENTION:    Glucerna Shake po TID, each supplement provides 220 kcal and 10 grams of protein  MVI daily   NUTRITION DIAGNOSIS:   Increased nutrient needs related to wound healing as evidenced by estimated needs.  GOAL:   Patient will meet greater than or equal to 90% of their needs  MONITOR:   PO intake, Supplement acceptance, Diet advancement, Labs, I & O's, Skin, Weight trends  REASON FOR ASSESSMENT:   Malnutrition Screening Tool    ASSESSMENT:   Patient with PMH significant for COPD, CHF, s/p TAVR, DM, CAD s/p CABG, and recent PE. Presents this admission with COPD/CHF exacerbation.   Pt on BiPAP at time of RD visit. Able to nod head yes and no to questions. Reports having a decreased appetite but unable to elaborate on daily recall. He is currently NPO due to breathing difficulty. RD to provide supplementation to maximize kcal and protein this admission.   Pt unable to provide UBW but nods his head yes when asked if he lost weight PTA. Records indicate pt weighed 101.6 kg on 4/25 and 87.3 kg this admission. Unsure how much is dry weight loss vs fluid fluctuation given history of CHF. Unable to diagnosis malnutrition without sup[porting evidence.   Medications: 40 mg lasix BID, SS novolog, prednisone, aldactone Labs: CBG 176-183   NUTRITION - FOCUSED PHYSICAL EXAM:    Most Recent Value  Orbital Region  No depletion  Upper Arm Region  Mild depletion  Thoracic and Lumbar Region  Unable to assess  Buccal Region  No depletion  Temple Region  Mild depletion  Clavicle Bone Region  Mild depletion  Clavicle and Acromion Bone Region  Mild depletion  Scapular Bone Region  Unable to assess  Dorsal Hand  No depletion  Patellar Region  Mild depletion  Anterior Thigh Region  Mild depletion  Posterior Calf Region  Mild depletion  Edema (RD Assessment)  Mild  Hair  Reviewed  Eyes  Reviewed  Mouth   Reviewed  Skin  Reviewed  Nails  Reviewed     Diet Order:   Diet Order            Diet NPO time specified  Diet effective now              EDUCATION NEEDS:   Education needs have been addressed  Skin:  Skin Assessment: Skin Integrity Issues: Skin Integrity Issues:: Other (Comment), Stage II, Unstageable Stage II: buttocks Unstageable: R heel Other: L arm wound  Last BM:  7/28  Height:   Ht Readings from Last 1 Encounters:  01/01/19 6' (1.829 m)    Weight:   Wt Readings from Last 1 Encounters:  01/01/19 87.3 kg    Ideal Body Weight:  80.9 kg  BMI:  Body mass index is 26.1 kg/m.  Estimated Nutritional Needs:   Kcal:  2200-2400 kcal  Protein:  110-125 grams  Fluid:  >/= 2 L/day    Mariana Single RD, LDN Clinical Nutrition Pager # [phone]

## 2019-01-01 NOTE — Progress Notes (Signed)
PT Cancellation Note  Patient Details Name: [name] MRN: [id number] DOB: 1931/10/09   Cancelled Treatment:    Reason Eval/Treat Not Completed: Medical issues which prohibited therapy. Pt declined therapy over difficulty breathing and will reattempt at another time.   Ramond Dial 01/01/2019, 10:01 AM   Mee Hives, PT MS Acute Rehab Dept. Number: [place] and [place]

## 2019-01-01 NOTE — Progress Notes (Signed)
PT Cancellation Note  Patient Details Name: [name] MRN: [id number] DOB: 1932/03/17   Cancelled Treatment:    Reason Eval/Treat Not Completed: Medical issues which prohibited therapy.  Pt is moving to cannula and RT states that PEEP is his main concern, not O2 sats.  Will continue to hold and see how pt is tolerating breathing without the pressure of Bipap.     Ramond Dial 01/01/2019, 12:55 PM   Mee Hives, PT MS Acute Rehab Dept. Number: [place] and [place]

## 2019-01-01 NOTE — Progress Notes (Signed)
  Echocardiogram 2D Echocardiogram has been performed.  [name] 01/01/2019, 10:01 AM

## 2019-01-01 NOTE — Consult Note (Signed)
NAME:  [name], MRN:  [id number], DOB:  08/14/1931, LOS: 1 ADMISSION DATE:  12/31/2018, CONSULTATION DATE:  7/30 REFERRING MD:  [facility] - family medicine, CHIEF COMPLAINT:  dyspnea  Brief History   85 yo M numerous comorbid conditions with several recent admissions. PCCM consulted given numerous admissions, COPD hx.   History of present illness   85 yo M PMH COPD on 2-4LNC, CHF sp TAVR, DM2, CAD sp CABG, recent PE, Afib on eliquis, severe AS who presented to ED 7/29 from [facility] with CC dyspnea. Recently hospitalized and discharged 7/20 with COPD and CHF exacerbation. Patient reports progressive dyspnea x 8 days, productive cough with increasing amount of green phlegm. Reports associated chest tightness, denies pain. Denies weight gain, leg swelling.   SARS Cov2 negative. In ED given albuterol with minimal relief and 125 solumedrol x 1, placed on 5LNC.   PCCM consulted 7/30 given re-admission to ensure no aspect of management is being overlooked.   Past Medical History   . Acute myocardial infarction, unspecified site, initial episode of care   . Anemia   . Anxiety   . Aortic stenosis    . Atrial fibrillation ([place])    a. permanent, on Coumadin for anticoagulation  . CAD (coronary artery disease)    a. s/p CABG in 1994 b. cath in 2015 showing normal LM, 100% LCx and RCA stenosis with 50-60% prox LAD stenosis and 100% mid-LAD stenosis; patent sequential SVG-OM2-OM3 and patent LIMA-LAD with PTCA of distal LAD via LIMA graft performed at that time  . Carotid stenosis   . CHF (congestive heart failure) ([place])   . COPD (chronic obstructive pulmonary disease) ([place])   . Depression   . Diabetes mellitus without complication ([place])    FASTING 98-120S  . GERD (gastroesophageal reflux disease)   . History of blood transfusion   . History of peptic ulcer disease   . Hyperlipidemia   . Hypertension   . Myocardial infarction ([place])    X2  . Peripheral  vascular disease ([place])   . Pneumonia    HX OF  . Renal artery stenosis ([place])   . Sleep apnea     Significant Hospital Events   7/29 admitted 7/30 PCCM consulted   Consults:  PCCM   Procedures:   Significant Diagnostic Tests:  7/29> mild bilateral pulmonary opacities. Small layering bilateral pleural effusions. Cardiomegaly. Similar to prior CXR on 7/17  7/30> LVEF 50-55%, Mildly increased LV wall thickness. Abnormal septal motion. RV reduced systolic function. RV systolic pressure elevated to 55.25mmHg. Severely dilated LA. TAVR present. Moderate-Severe mitral stenosis, moderate tricuspid regurg. Finding similar to ECHO 11/24/2018  Micro Data:  7/29 SARS CoV2 > neg   Antimicrobials:  Doxycycline 7/30- 8/4  Interim history/subjective:  Returned to bed after using bedside commode. Dyspneic.  5LNC decreased to 3LNC   Objective   Blood pressure (!) 160/82, pulse 90, temperature 97.6 F (36.4 C), temperature source Axillary, resp. rate 18, height 6' (1.829 m), weight 87.3 kg, SpO2 100 %.    FiO2 (%):  [40 %] 40 %   Intake/Output Summary (Last 24 hours) at 01/01/2019 1437 Last data filed at 01/01/2019 1404 Gross per 24 hour  Intake 0 ml  Output 200 ml  Net -200 ml   Filed Weights   01/01/19 0530  Weight: 87.3 kg    Examination: General: Chronically ill appearing older adult, reclined in bed with increased WOB HENT: NCAT. Poor dentition with several missing teeth. Pink mmm. Anicteric  sclera  Lungs: Belly breathing. Diminished bibasilar breath sounds.  Cardiovascular: irir. Cap refill < 3 seconds BUE BUE Abdomen: Protuberant, soft, round, non-tender. Extremities: No obvious joint deformity. LLE edema > RLE edema. No cyanosis.  Neuro: AAOx4, PERRL. Following commands GU: indwelling catheter Skin: pale, clean, dry, warm without rash. Scattered ecchymosis BUE. LUE dressing c/d/i    Resolved Hospital Problem list   NA  Assessment & Plan:  This is an 85 yo male  with extensive PMH and co-morbidities including CHF, CAD, AoS S/P TAVR, AF, recent PE, chronic hypoxic respiratory failure on home O2 and CPAP, COPD with recurrent admissions d/t SOB. CXR c/w recurrent pleural effusions. Do not think this is r/t COPD exacerbation but culmination of AoS, MR, severe PH, in setting of chronic hypoxic respiratory failure.   Recommendations:    Increase lasix TID (done) Add spironolactone (done) Follow renal panel  Strict I/O Will perform POCUS to consider therapeutic thoracentesis  D/C prednisone as no wheezing on exam and only contributes to 3rd spacing of fluid at this point  Will attempt theses measures however if they prove unsuccessful there are likely no further options and would recommend palliative care/hospice. Any acute decompensation would likely be non-survivable. He would not do well if he were to require intubation and mechanical ventilation in setting of severe PH.   Thank you for the consult.  We will follow with you.  Best practice:  Diet: per primary Pain/Anxiety/Delirium protocol (if indicated): per primary  VAP protocol (if indicated): not indicated DVT prophylaxis: on apixiban  GI prophylaxis: not indicated  Glucose control: SSI Mobility: per primary  Code Status: FULL CODE Family Communication: per primary    Labs   CBC: Recent Labs  Lab 12/31/18 1515 01/01/19 0605  WBC 7.1 3.2*  NEUTROABS 5.3 2.7  HGB 9.0* 10.0*  HCT 28.3* 31.9*  MCV 88.4 88.4  PLT 288 366    Basic Metabolic Panel: Recent Labs  Lab 12/31/18 1515 01/01/19 0605  NA 134* 137  K 4.0 4.1  CL 96* 97*  CO2 29 27  GLUCOSE 140* 183*  BUN 15 15  CREATININE 0.63 0.77  CALCIUM 8.6* 8.9   GFR: Estimated Creatinine Clearance: 74.1 mL/min (by C-G formula based on SCr of 0.77 mg/dL). Recent Labs  Lab 12/31/18 1515 01/01/19 0605  WBC 7.1 3.2*    Liver Function Tests: Recent Labs  Lab 01/01/19 0605  AST 30  ALT 36  ALKPHOS 160*  BILITOT 1.1   PROT 5.8*  ALBUMIN 2.8*   No results for input(s): LIPASE, AMYLASE in the last 168 hours. No results for input(s): AMMONIA in the last 168 hours.  ABG    Component Value Date/Time   PHART 7.567 (H) 01/01/2019 0740   PCO2ART 31.8 (L) 01/01/2019 0740   PO2ART 133 (H) 01/01/2019 0740   HCO3 29.3 (H) 01/01/2019 0740   TCO2 33 (H) 12/17/2018 1128   ACIDBASEDEF TEST WILL BE CREDITED 09/13/2017 1049   O2SAT 99.6 01/01/2019 0740     Coagulation Profile: Recent Labs  Lab 12/31/18 1515  INR 1.9*    Cardiac Enzymes: No results for input(s): CKTOTAL, CKMB, CKMBINDEX, TROPONINI in the last 168 hours.  HbA1C: Hgb A1c MFr Bld  Date/Time Value Ref Range Status  12/17/2018 04:23 PM 6.1 (H) 4.8 - 5.6 % Final    Comment:    (NOTE) Pre diabetes:          5.7%-6.4% Diabetes:              >  6.4% Glycemic control for   <7.0% adults with diabetes   09/13/2017 10:50 AM 6.1 (H) 4.8 - 5.6 % Final    Comment:    (NOTE) Pre diabetes:          5.7%-6.4% Diabetes:              >6.4% Glycemic control for   <7.0% adults with diabetes     CBG: Recent Labs  Lab 01/01/19 0826 01/01/19 1158  GLUCAP 178* 176*    Review of Systems:   Review of Systems  Constitutional: Negative.   HENT: Negative.   Eyes: Negative.   Respiratory: Positive for cough and shortness of breath.   Cardiovascular: Positive for palpitations, orthopnea and leg swelling.  Gastrointestinal: Negative.   Genitourinary: Positive for urgency.  Musculoskeletal: Negative.   Skin: Negative.   Neurological: Negative.   Endo/Heme/Allergies: Bruises/bleeds easily.  Psychiatric/Behavioral: The patient is nervous/anxious.      Past Medical History  He,  has a past medical history of Acute myocardial infarction, unspecified site, initial episode of care, Anemia, Anxiety, Aortic stenosis ( ), Atrial fibrillation ([place]), CAD (coronary artery disease), Carotid stenosis, CHF (congestive heart failure) ([place]), COPD (chronic  obstructive pulmonary disease) ([place]), Depression, Diabetes mellitus without complication ([place]), GERD (gastroesophageal reflux disease), History of blood transfusion, History of peptic ulcer disease, Hyperlipidemia, Hypertension, Myocardial infarction [facility]), Peripheral vascular disease ([place]), Pneumonia, Renal artery stenosis ([place]), and Sleep apnea.   Surgical History    Past Surgical History:  Procedure Laterality Date  . AORTIC ARCH ANGIOGRAPHY N/A 08/14/2017   Procedure: AORTIC ARCH ANGIOGRAPHY;  Surgeon: Sherren Mocha, MD;  Location: [place] CV LAB;  Service: Cardiovascular;  Laterality: N/A;  . CARDIAC CATHETERIZATION    . CAROTID ENDARTERECTOMY    . COLON SURGERY    . CORONARY ARTERY BYPASS GRAFT  1994  . ENDARTERECTOMY Right 01/05/2013   Procedure: ENDARTERECTOMY CAROTID;  Surgeon: Rosetta Posner, MD;  Location: [place];  Service: Vascular;  Laterality: Right;  . INCISION AND DRAINAGE Left 11/16/2018   Procedure: INCISION AND DRAINAGE LEFT FOREARM/ARM;  Surgeon: Leanora Cover, MD;  Location: [place];  Service: Orthopedics;  Laterality: Left;  . KNEE SURGERY  08/2003   left knee/ARTHROSCOPIC  . LEFT HEART CATHETERIZATION WITH CORONARY/GRAFT ANGIOGRAM N/A 09/01/2013   Procedure: LEFT HEART CATHETERIZATION WITH Beatrix Fetters;  Surgeon: Sinclair Grooms, MD;  Location: [facility] CATH LAB;  Service: Cardiovascular;  Laterality: N/A;  . PATCH ANGIOPLASTY Right 01/05/2013   Procedure: PATCH ANGIOPLASTY;  Surgeon: Rosetta Posner, MD;  Location: [place];  Service: Vascular;  Laterality: Right;  . PERCUTANEOUS CORONARY STENT INTERVENTION (PCI-S)  09/01/2013   Procedure: PERCUTANEOUS CORONARY STENT INTERVENTION (PCI-S);  Surgeon: Sinclair Grooms, MD;  Location: [facility] CATH LAB;  Service: Cardiovascular;;  . removal of bleeding ulcer  1994  . RENAL ARTERY STENT     stenting of the left renal artery as well as a cutting balloon angioplasty for treatment of in-stent restenosis coronary artery bypass grafting  in 1994.  [name] RIGHT/LEFT HEART CATH AND CORONARY/GRAFT ANGIOGRAPHY N/A 08/14/2017   Procedure: RIGHT/LEFT HEART CATH AND CORONARY/GRAFT ANGIOGRAPHY;  Surgeon: Sherren Mocha, MD;  Location: [place] CV LAB;  Service: Cardiovascular;  Laterality: N/A;  . TEE WITHOUT CARDIOVERSION N/A 09/17/2017   Procedure: TRANSESOPHAGEAL ECHOCARDIOGRAM (TEE);  Surgeon: Sherren Mocha, MD;  Location: [place];  Service: Open Heart Surgery;  Laterality: N/A;     Social History   reports that he quit smoking about 25  years ago. He has never used smokeless tobacco. He reports that he does not drink alcohol or use drugs.   Family History   His family history includes Coronary artery disease in his father, mother, and another family member; Heart attack in his father and mother; Heart disease in his brother, father, mother, sister, and another family member; Hyperlipidemia in his mother; Hypertension in his mother.   Allergies Allergies  Allergen Reactions  . Bactrim [Sulfamethoxazole-Trimethoprim] Other (See Comments)    "Allergic," per MAR  . Levaquin [Levofloxacin In D5w] Diarrhea and Other (See Comments)    "Allergic," per [facility]     Home Medications  Prior to Admission medications   Medication Sig Start Date End Date Taking? Authorizing Provider  allopurinol (ZYLOPRIM) 300 MG tablet Take 300 mg by mouth daily. 10/12/18  Yes [provider]  Amino Acids-Protein Hydrolys (PRO-STAT) LIQD Take 30 mLs by mouth 2 (two) times a day.   Yes [provider]  apixaban (ELIQUIS) 5 MG TABS tablet Take 1 tablet (5 mg total) by mouth 2 (two) times daily. 12/22/18 01/21/19 Yes Lurline Del, MD  atorvastatin (LIPITOR) 80 MG tablet Take 1 tablet (80 mg total) by mouth at bedtime. 12/07/14  Yes Lelon Perla, MD  benazepril (LOTENSIN) 5 MG tablet Take 1 tablet (5 mg total) by mouth daily. 12/23/18  Yes Lurline Del, MD  bisacodyl (DULCOLAX) 5 MG EC tablet Take 1 tablet (5 mg total) by mouth daily. 12/23/18   Yes Lurline Del, MD  brimonidine (ALPHAGAN) 0.2 % ophthalmic solution Place 1 drop into both eyes 2 (two) times daily.   Yes [provider]  ferrous sulfate 325 (65 FE) MG tablet Take 325 mg by mouth daily with breakfast.    Yes [provider]  furosemide (LASIX) 40 MG tablet Take 1 tablet (40 mg total) by mouth daily. Patient taking differently: Take 60 mg by mouth daily.  12/23/18  Yes Lurline Del, MD  Gauze Pads & Dressings (FOAM DRESSING) PADS Apply 1 patch topically See admin instructions. Apply a foam dressing to sacrum every day AND as needed for redness   Yes [provider]  HYDROcodone-acetaminophen (NORCO/VICODIN) 5-325 MG tablet Take 1-2 tablets by mouth every 4 (four) hours as needed for moderate pain or severe pain.    Yes [provider]  insulin aspart (NOVOLOG FLEXPEN) 100 UNIT/ML FlexPen Inject 2 Units into the skin See admin instructions. Inject 2 units into the skin at 9 AM in the morning for a BGL >200   Yes [provider]  lansoprazole (PREVACID) 30 MG capsule Take 30 mg by mouth daily before breakfast.    Yes [provider]  levalbuterol (XOPENEX) 0.63 MG/3ML nebulizer solution Take 3 mLs (0.63 mg total) by nebulization every 6 (six) hours as needed for wheezing or shortness of breath. Patient taking differently: Take 0.63 mg by nebulization 3 (three) times daily.  12/22/18  Yes Lurline Del, MD  metFORMIN (GLUCOPHAGE-XR) 500 MG 24 hr tablet Take 500 mg by mouth daily. 12/10/18  Yes [provider]  methocarbamol (ROBAXIN) 750 MG tablet Take 750 mg by mouth every 12 (twelve) hours as needed for muscle spasms.  11/28/18  Yes [provider]  metoprolol tartrate (LOPRESSOR) 25 MG tablet Take 1 tablet (25 mg total) by mouth 2 (two) times daily. Patient taking differently: Take 12.5 mg by mouth 2 (two) times daily.  12/25/18 03/25/19 Yes Lelon Perla, MD  Multiple Vitamin (MULTIVITAMIN WITH MINERALS)  TABS Take  1 tablet by mouth daily.   Yes [provider]  NON FORMULARY Take 240 mLs by mouth See admin instructions. MedPass: Drink 240 ml's by mouth two times a day   Yes [provider]  OXYGEN Inhale 3-4 L/min into the lungs continuous. 3 liters/min continuously TO MAINTAIN SATS <90% and may increase to 4 liters/min as needed with dyspnea upon exertion or shortness of breath   Yes [provider]  polyethylene glycol (MIRALAX / GLYCOLAX) 17 g packet Take 17 g by mouth 2 (two) times daily. Patient taking differently: Take 17 g by mouth 2 (two) times daily. MIXED IN LIQUID DRINK 12/22/18  Yes Lurline Del, MD  spironolactone (ALDACTONE) 25 MG tablet Take 12.5 mg by mouth daily.   Yes [provider]  tamsulosin (FLOMAX) 0.4 MG CAPS capsule Take 1 capsule (0.4 mg total) by mouth daily after supper. Patient taking differently: Take 0.4 mg by mouth every evening.  12/22/18  Yes Lurline Del, MD  trolamine salicylate (ASPERCREME) 10 % cream Apply 1 application topically every 6 (six) hours as needed for muscle pain.    Yes [provider]  umeclidinium bromide (INCRUSE ELLIPTA) 62.5 MCG/INH AEPB Inhale 1 puff into the lungs daily. Patient taking differently: Inhale 1 puff into the lungs at bedtime.  11/27/18  Yes Matilde Haymaker, MD  acetaminophen-codeine (TYLENOL #3) 300-30 MG tablet Take 1 tablet by mouth 2 (two) times daily as needed for pain. 12/12/18   [provider]  budesonide-formoterol (SYMBICORT) 80-4.5 MCG/ACT inhaler Inhale 1 puff into the lungs as needed for shortness of breath. 09/05/18   [provider]  terazosin (HYTRIN) 2 MG capsule Take 1 capsule (2 mg total) by mouth at bedtime. Patient not taking: Reported on 12/31/2018 12/22/18 01/21/19  Lurline Del, MD    I spent 45 minutes in direct patient care including reviewing data,  discussing with other providers, assessment, planning and stabilization and documentation. Time is  exclusive to this patient and does not include procedures.   Francine Graven, MSN, AGACNP  Pager [phone] or if no answer [phone] [facility] Pulmonary & Critical Care

## 2019-01-01 NOTE — Progress Notes (Signed)
Nurse noted reddish pigment in patient's urine and ordered urinalysis.  Patient has an indwelling Foley due to significant BPH and urinary retention.  Urinalysis showed hematuria with bacteria present, however patient does not complain of symptoms at this time.  No antibiotics currently indicated for asymptomatic bacteriuria.  Will revisit if patient becomes symptomatic.

## 2019-01-01 NOTE — TOC Progression Note (Addendum)
Transition of Care [facility]) - Progression Note    Patient Details  Name: JIBRAN CROOKSHANKS MRN: [id number] Date of Birth: [birth year]  Transition of Care [facility]) CM/SW [place], [place] Phone Number: 01/01/2019, 11:13 AM  Clinical Narrative:     CSW spoke with the patient's spouse via phone.  She confirmed the patient was at [facility] rehab at [facility]. Patient's wife was informed that PT went to complete evaluation but patient declined therapy over difficulty breathing. CSW advised;CSW will continue to follow for PT recommendations and assist with discharge planning. If recommended, she is agreeable with the patient returning to [facility].  Patient's wife states no questions or concerns at this time.   Thurmond Butts, MSW, LCSWA Clinical Social Worker [phone]     Barriers to Discharge: Continued Medical Work up  Expected Discharge Plan and Services   In-house Referral: Clinical Social Work     Living arrangements for the past 2 months: [place], Single Family Home                                       Social Determinants of Health (SDOH) Interventions    Readmission Risk Interventions No flowsheet data found.

## 2019-01-01 NOTE — Progress Notes (Addendum)
FAMILY MEDICINE TEACHING SERVICE Patient - Please contact intern pager [phone] or check [facility] residency on via website [email or web address] (login: [email or web address]) for questions regarding care. Text pages welcome. DO NOT page listed attending provider unless there is no answer from the number above.  

## 2019-01-01 NOTE — Progress Notes (Signed)
OT Cancellation Note  Patient Details Name: [name] MRN: [id number] DOB: [birth year]   Cancelled Treatment:    Reason Eval/Treat Not Completed: Patient at procedure or test/ unavailable (ECHO);Other (comment)( on bipap). Plan to reattempt at a later time.   Tyrone Schimke, OT Acute Rehabilitation Services Pager: [phone] Office: [phone]  01/01/2019, 9:48 AM

## 2019-01-02 ENCOUNTER — Inpatient Hospital Stay (HOSPITAL_COMMUNITY): Payer: Medicare Other

## 2019-01-02 ENCOUNTER — Telehealth: Payer: Self-pay | Admitting: Acute Care

## 2019-01-02 DIAGNOSIS — J90 Pleural effusion, not elsewhere classified: Secondary | ICD-10-CM

## 2019-01-02 DIAGNOSIS — J9621 Acute and chronic respiratory failure with hypoxia: Secondary | ICD-10-CM

## 2019-01-02 DIAGNOSIS — R0603 Acute respiratory distress: Secondary | ICD-10-CM

## 2019-01-02 DIAGNOSIS — Z7901 Long term (current) use of anticoagulants: Secondary | ICD-10-CM

## 2019-01-02 DIAGNOSIS — I5033 Acute on chronic diastolic (congestive) heart failure: Secondary | ICD-10-CM

## 2019-01-02 LAB — BASIC METABOLIC PANEL
Anion gap: 9 (ref 5–15)
Anion gap: 10 (ref 5–15)
BUN: 24 mg/dL — ABNORMAL HIGH (ref 8–23)
BUN: 22 mg/dL (ref 8–23)
CO2: 31 mmol/L (ref 22–32)
CO2: 29 mmol/L (ref 22–32)
Calcium: 8.4 mg/dL — ABNORMAL LOW (ref 8.9–10.3)
Calcium: 8.8 mg/dL — ABNORMAL LOW (ref 8.9–10.3)
Chloride: 97 mmol/L — ABNORMAL LOW (ref 98–111)
Chloride: 96 mmol/L — ABNORMAL LOW (ref 98–111)
Creatinine, Ser: 0.72 mg/dL (ref 0.61–1.24)
Creatinine, Ser: 0.7 mg/dL (ref 0.61–1.24)
GFR calc Af Amer: >60 mL/min (ref >60)
GFR calc Af Amer: >60 mL/min (ref >60)
GFR calc non Af Amer: >60 mL/min (ref >60)
GFR calc non Af Amer: >60 mL/min (ref >60)
Glucose, Bld: 157 mg/dL — ABNORMAL HIGH (ref 70–99)
Glucose, Bld: 141 mg/dL — ABNORMAL HIGH (ref 70–99)
Potassium: 3.5 mmol/L (ref 3.5–5.1)
Potassium: 3.7 mmol/L (ref 3.5–5.1)
Sodium: 137 mmol/L (ref 135–145)
Sodium: 135 mmol/L (ref 135–145)

## 2019-01-02 LAB — CBC
HCT: 25.7 % — ABNORMAL LOW (ref 39.0–52.0)
Hemoglobin: 8.1 g/dL — ABNORMAL LOW (ref 13.0–17.0)
MCH: 27.6 pg (ref 26.0–34.0)
MCHC: 31.5 g/dL (ref 30.0–36.0)
MCV: 87.7 fL (ref 80.0–100.0)
Platelets: 309 10*3/uL (ref 150–400)
RBC: 2.93 MIL/uL — ABNORMAL LOW (ref 4.22–5.81)
RDW: 15.9 % — ABNORMAL HIGH (ref 11.5–15.5)
WBC: 6.6 10*3/uL (ref 4.0–10.5)
nRBC: 0 % (ref 0.0–0.2)

## 2019-01-02 LAB — GLUCOSE, CAPILLARY
Glucose-Capillary: 139 mg/dL — ABNORMAL HIGH (ref 70–99)
Glucose-Capillary: 157 mg/dL — ABNORMAL HIGH (ref 70–99)
Glucose-Capillary: 106 mg/dL — ABNORMAL HIGH (ref 70–99)
Glucose-Capillary: 137 mg/dL — ABNORMAL HIGH (ref 70–99)

## 2019-01-02 MED ORDER — OXYCODONE HCL 5 MG PO TABS
2.5000 mg | ORAL_TABLET | Freq: Once | ORAL | Status: AC
  Administered 2019-01-02: 2.5 mg via ORAL
  Filled 2019-01-02: qty 1

## 2019-01-02 MED ORDER — IPRATROPIUM BROMIDE 0.02 % IN SOLN
0.5000 mg | Freq: Three times a day (TID) | RESPIRATORY_TRACT | Status: DC
  Administered 2019-01-03 – 2019-01-04 (×5): 0.5 mg via RESPIRATORY_TRACT
  Filled 2019-01-02 (×5): qty 2.5

## 2019-01-02 MED ORDER — METOPROLOL TARTRATE 50 MG PO TABS
50.0000 mg | ORAL_TABLET | Freq: Two times a day (BID) | ORAL | Status: DC
  Administered 2019-01-02 – 2019-01-05 (×7): 50 mg via ORAL
  Filled 2019-01-02 (×7): qty 1

## 2019-01-02 MED ORDER — TAMSULOSIN HCL 0.4 MG PO CAPS
0.8000 mg | ORAL_CAPSULE | Freq: Every evening | ORAL | Status: DC
  Administered 2019-01-02 – 2019-01-05 (×4): 0.8 mg via ORAL
  Filled 2019-01-02 (×4): qty 2

## 2019-01-02 MED ORDER — LEVALBUTEROL HCL 0.63 MG/3ML IN NEBU
0.6300 mg | INHALATION_SOLUTION | Freq: Three times a day (TID) | RESPIRATORY_TRACT | Status: DC
  Administered 2019-01-03 – 2019-01-04 (×5): 0.63 mg via RESPIRATORY_TRACT
  Filled 2019-01-02 (×5): qty 3

## 2019-01-02 MED ORDER — OXYCODONE-ACETAMINOPHEN 5-325 MG PO TABS
1.0000 | ORAL_TABLET | ORAL | Status: AC
  Administered 2019-01-02: 1 via ORAL
  Filled 2019-01-02: qty 1

## 2019-01-02 MED ORDER — ACETAZOLAMIDE SODIUM 500 MG IJ SOLR
500.0000 mg | Freq: Once | INTRAMUSCULAR | Status: AC
  Administered 2019-01-02: 11:00:00 500 mg via INTRAVENOUS
  Filled 2019-01-02: qty 500

## 2019-01-02 MED ORDER — DICLOFENAC SODIUM 1 % TD GEL
4.0000 g | Freq: Once | TRANSDERMAL | Status: DC
  Filled 2019-01-02: qty 100

## 2019-01-02 MED ORDER — POTASSIUM CHLORIDE CRYS ER 20 MEQ PO TBCR
40.0000 meq | EXTENDED_RELEASE_TABLET | Freq: Once | ORAL | Status: AC
  Administered 2019-01-02: 10:00:00 40 meq via ORAL
  Filled 2019-01-02: qty 2

## 2019-01-02 NOTE — Care Plan (Signed)
Patient has been identified as being high risk for 12 month mortality, multiple life limiting co-morbidities, advanced age, functional status decline requiring full assist with transfer and ADLs, chronic non-healing wounds, recent PE on anticoagulation and need for frequent thoracentesis. He has a Full Code order and has no documented goals of care. We need to engage this patient and family at minimum with advance care planning and establish some parameters for interventions moving forward. I encourage the primary to service to consider completing a MOST form with this gentleman using the ACP link to the left of this screen in the Epic storyboard. If appropriate please consider a palliative care consultation during this admission or place a community based palliative care referral at the time of discharge.  Lane Hacker, DO Palliative Medicine

## 2019-01-02 NOTE — Discharge Instructions (Signed)
Discharge summary:  -We are ordering a referral to urology for discharge.  Please follow-up with them regarding your BPH and Foley catheter use. - Please follow up with your PCP within one week for recheck, including new medications such as your blood pressure medicine - Please return if you experience similar symptoms, high fevers, confusion, chest pain that does not subside within 15 minutes or shortness of breath.

## 2019-01-02 NOTE — Care Management Important Message (Signed)
Important Message  Patient Details  Name: [name] MRN: [id number] Date of Birth: 19-Oct-1931   Medicare Important Message Given:  Yes     [name] 01/02/2019, 12:39 PM

## 2019-01-02 NOTE — Evaluation (Signed)
Occupational Therapy Evaluation Patient Details Name: [name] MRN: [id number] DOB: [birth year] Today's Date: 01/02/2019    History of Present Illness Pt is an 85 y/o male admitted from SNF secondary to worsening SOB. Pt is s/p thoracentesis x2. PMH includes DM, CAD s/p CABG, COPD on home O2, CHF, s/p TAVR, and a fib.   Clinical Impression   This 85 yo male admitted and underwent above presents to acute OT with decreased endurance for activity (due to drop in BP with position changes), decreased mobility, decreased balance all affecting his safety and independence with his basic ADLs. He will continue to benefit from acute OT with follow up back at SNF.    Follow Up Recommendations  SNF;Supervision/Assistance - 24 hour    Equipment Recommendations  Other (comment)(TBD at next venue)       Precautions / Restrictions Precautions Precautions: Fall;Other (comment) Precaution Comments: watch BP  Restrictions Weight Bearing Restrictions: No      Mobility Bed Mobility Overal bed mobility: Needs Assistance Bed Mobility: Supine to Sit     Supine to sit: Supervision     General bed mobility comments: Supervision for line management. Pt with dizziness upon sitting and BP at 94/50 mmHg.Had pt perform ankle pumps and sat in sitting for prolonged period; BP increased to 106/54 mmHg.   Transfers Overall transfer level: Needs assistance Equipment used: Rolling walker (2 wheeled) Transfers: Sit to/from Stand Sit to Stand: Min assist;+2 safety/equipment Stand pivot transfers: Min assist;+2 safety/equipment       General transfer comment: Min A +2 for steadying and safety during transfers given symptoms. Pt reporting some dizziness after completion of transfer. BP at 92/50 mmHg.     Balance Overall balance assessment: Needs assistance Sitting-balance support: Feet supported;No upper extremity supported Sitting balance-Leahy Scale: Good     Standing balance support: Bilateral  upper extremity supported;During functional activity Standing balance-Leahy Scale: Poor Standing balance comment: Reliant on bUE support                            ADL either performed or assessed with clinical judgement   ADL Overall ADL's : Needs assistance/impaired Eating/Feeding: Independent Eating/Feeding Details (indicate cue type and reason): supported sitting Grooming: Set up Grooming Details (indicate cue type and reason): supported sitting Upper Body Bathing: Supervision/ safety;Set up Upper Body Bathing Details (indicate cue type and reason): supported sitting Lower Body Bathing: Moderate assistance;+2 for safety/equipment;Sit to/from stand   Upper Body Dressing : Minimal assistance Upper Body Dressing Details (indicate cue type and reason): supported sitting Lower Body Dressing: Maximal assistance Lower Body Dressing Details (indicate cue type and reason): min A +2 sit<>stand for safety Toilet Transfer: Minimal assistance;+2 for safety/equipment;Stand-pivot   Toileting- Clothing Manipulation and Hygiene: Maximal assistance Toileting - Clothing Manipulation Details (indicate cue type and reason): min A +2 sit<>stand for safety             Vision Baseline Vision/History: Wears glasses Wears Glasses: At all times Patient Visual Report: No change from baseline              Pertinent Vitals/Pain Pain Assessment: Faces Faces Pain Scale: Hurts little more Pain Location: R upper arm when taking BP  Pain Descriptors / Indicators: Sore Pain Intervention(s): Monitored during session;Limited activity within patient's tolerance;Repositioned     Hand Dominance  right   Extremity/Trunk Assessment Upper Extremity Assessment Upper Extremity Assessment: Overall WFL for tasks assessed   Lower Extremity Assessment Lower  Extremity Assessment: Generalized weakness   Cervical / Trunk Assessment Cervical / Trunk Assessment: Kyphotic   Communication  Communication Communication: HOH   Cognition Arousal/Alertness: Awake/alert Behavior During Therapy: WFL for tasks assessed/performed Overall Cognitive Status: Within Functional Limits for tasks assessed                                        Exercises Exercises: Total Joint Total Joint Exercises Ankle Circles/Pumps: AROM;Both;10 reps;Seated        Home Living Family/patient expects to be discharged to:: Skilled nursing facility                                 Additional Comments: Was at [facility] for rehab prior to admission.       Prior Functioning/Environment Level of Independence: Needs assistance  Gait / Transfers Assistance Needed: Pt working with PT on ambulation. Required assist from staff for mobility.  ADL's / Homemaking Assistance Needed: Required assist from staff for ADL tasks.             OT Problem List: Decreased strength;Decreased activity tolerance;Impaired balance (sitting and/or standing);Decreased knowledge of use of DME or AE;Pain      OT Treatment/Interventions: Self-care/ADL training;Therapeutic exercise;Energy conservation;DME and/or AE instruction;Therapeutic activities;Patient/family education;Balance training    OT Goals(Current goals can be found in the care plan section) Acute Rehab OT Goals Patient Stated Goal: to go back to [facility] so I can ge stronger OT Goal Formulation: With patient Time For Goal Achievement: 01/16/19 Potential to Achieve Goals: Good  OT Frequency: Min 2X/week   Barriers to D/C: Decreased caregiver support          Co-evaluation PT/OT/SLP Co-Evaluation/Treatment: Yes Reason for Co-Treatment: Complexity of the patient's impairments (multi-system involvement);For patient/therapist safety;To address functional/ADL transfers PT goals addressed during session: Mobility/safety with mobility;Balance;Proper use of DME OT goals addressed during session: ADL's and self-care;Strengthening/ROM       AM-PAC OT "6 Clicks" Daily Activity     Outcome Measure Help from another person eating meals?: None Help from another person taking care of personal grooming?: A Little Help from another person toileting, which includes using toliet, bedpan, or urinal?: A Lot Help from another person bathing (including washing, rinsing, drying)?: A Lot Help from another person to put on and taking off regular upper body clothing?: A Little Help from another person to put on and taking off regular lower body clothing?: A Lot 6 Click Score: 16   End of Session Equipment Utilized During Treatment: Gait belt;Rolling walker  Activity Tolerance: Other (comment)(limited by orthostatic HTN) Patient left:    OT Visit Diagnosis: Unsteadiness on feet (R26.81);Muscle weakness (generalized) (M62.81);Pain Pain - Right/Left: Right Pain - part of body: Arm(BP cuff)                Time: 1103-1594 OT Time Calculation (min): 26 min Charges:  OT General Charges $OT Visit: 1 Visit OT Evaluation $OT Eval Moderate Complexity: 1 Mod [place], OTR/L Acute [facility] [phone] Office [phone]   .cathy  Almon Register 01/02/2019, 3:48 PM

## 2019-01-02 NOTE — Progress Notes (Signed)
Pulmonary Progress Note S: Seen in f/u for SOB.  Feeling better with diuresis and tap.  Denies cough, fever, chest pain.  O: Today's Vitals   01/02/19 0713 01/02/19 0715 01/02/19 0717 01/02/19 0836  BP:    96/78  Pulse:    (!) 101  Resp:    (!) 27  Temp:    98 F (36.7 C)  TempSrc:    Oral  SpO2: 100%  100% 100%  Weight:      Height:      PainSc:  6      Body mass index is 25.41 kg/m. GEN: elderly man in NAD HEENT: MMM, no thrush CV: RRR, ext warm PULM: less tachypneic today, basilar wheezing improved, occasional crackles GI: Soft, +BS EXT: Trace edema NEURO: Moves all 4 ext to command PSYCH: AOx3, fair insight SKIN: no rashes, pressure injury noted at nasal bone-BIPAP interface  Labs BUN and HCO3 up a bit 2L urine since admission  A:  # Multifactorial SOB suspected more driven by volume overload and third spacing than AECOPD, see discussion yesterday.  Improved with thoracentesis and diuresis.  P: - Continue lasix, spironolactone as ordered - Add dose of diamox to prevent contraction alkalosis - Stop diuresis once creatinine bumps - Gave 57mEq K - Pulmonary toileting measures as ordered - Eval L chest w [place], if still sizable effusion will tap - Regarding outpatient management, seems like infrastructure not set up well yet for intermittent outpatient taps outside of IR, will brainstorm with group - Will have weekend team eval at least once this weekend, call if questions  Erskine Emery MD

## 2019-01-02 NOTE — Evaluation (Signed)
Physical Therapy Evaluation Patient Details Name: [name] MRN: [id number] DOB: 19-Feb-1932 Today's Date: 01/02/2019   History of Present Illness  Pt is an 85 y/o male admitted from SNF secondary to worsening SOB. Pt is s/p thoracentesis. PMH includes DM, CAD s/p CABG, COPD on home O2, CHF, s/p TAVR, and a fib.   Clinical Impression  Pt admitted secondary to problem above with deficits below. Pt requiring Min A +2 for safety to safely transfer to chair. Pt with some dizziness in sitting; checked BP and BP at 94/50 mmHg. With prolonged seated rest and after performing ankle pumps BP elevated back to 106/54 mmHg. After transfer to chair, BP at 92/50 mmHg. Pt reports he plans to return to SNF at d/c for continued rehab; feel that is appropriate. Will continue to follow acutely to maximize functional mobility independence and safety.     Follow Up Recommendations SNF;Supervision/Assistance - 24 hour    Equipment Recommendations  None recommended by PT    Recommendations for Other Services       Precautions / Restrictions Precautions Precautions: Fall;Other (comment) Precaution Comments: watch BP  Restrictions Weight Bearing Restrictions: No      Mobility  Bed Mobility Overal bed mobility: Needs Assistance Bed Mobility: Supine to Sit     Supine to sit: Supervision     General bed mobility comments: Supervision for line management. Pt with dizziness upon sitting and BP at 94/50 mmHg.Had pt perform ankle pumps and sat in sitting for prolonged period; BP increased to 106/54 mmHg.   Transfers Overall transfer level: Needs assistance Equipment used: Rolling walker (2 wheeled) Transfers: Sit to/from Stand Sit to Stand: Min assist;+2 safety/equipment Stand pivot transfers: Min assist;+2 safety/equipment       General transfer comment: Min A +2 for steadying and safety during transfers given symptoms. Pt reporting some dizziness after completion of transfer. BP at 92/50 mmHg.    Ambulation/Gait             General Gait Details: unable this session.   Stairs            Wheelchair Mobility    Modified Rankin (Stroke Patients Only)       Balance Overall balance assessment: Needs assistance Sitting-balance support: Feet supported Sitting balance-Leahy Scale: Good     Standing balance support: Bilateral upper extremity supported;During functional activity Standing balance-Leahy Scale: Poor Standing balance comment: Reliant on bUE support                              Pertinent Vitals/Pain Pain Assessment: Faces Faces Pain Scale: Hurts little more Pain Location: R upper arm when taking BP  Pain Descriptors / Indicators: Sore Pain Intervention(s): Limited activity within patient's tolerance;Monitored during session;Repositioned    Home Living Family/patient expects to be discharged to:: Skilled nursing facility                 Additional Comments: Was at [facility] for rehab prior to admission.     Prior Function Level of Independence: Needs assistance   Gait / Transfers Assistance Needed: Pt working with PT on ambulation. Required assist from staff for mobility.   ADL's / Homemaking Assistance Needed: Required assist from staff for ADL tasks.         Hand Dominance        Extremity/Trunk Assessment   Upper Extremity Assessment Upper Extremity Assessment: Defer to OT evaluation    Lower Extremity Assessment  Lower Extremity Assessment: Generalized weakness    Cervical / Trunk Assessment Cervical / Trunk Assessment: Kyphotic  Communication   Communication: HOH  Cognition Arousal/Alertness: Awake/alert Behavior During Therapy: WFL for tasks assessed/performed Overall Cognitive Status: Within Functional Limits for tasks assessed                                        General Comments      Exercises Total Joint Exercises Ankle Circles/Pumps: AROM;Both;10 reps;Seated    Assessment/Plan    PT Assessment Patient needs continued PT services  PT Problem List Decreased strength;Decreased mobility;Decreased safety awareness;Decreased activity tolerance;Cardiopulmonary status limiting activity;Pain;Decreased balance;Impaired sensation;Decreased knowledge of use of DME       PT Treatment Interventions DME instruction;Therapeutic activities;Gait training;Therapeutic exercise;Balance training;Stair training;Functional mobility training;Patient/family education    PT Goals (Current goals can be found in the Care Plan section)  Acute Rehab PT Goals Patient Stated Goal: to move better PT Goal Formulation: With patient Time For Goal Achievement: 01/16/19 Potential to Achieve Goals: Good    Frequency Min 2X/week   Barriers to discharge        Co-evaluation PT/OT/SLP Co-Evaluation/Treatment: Yes Reason for Co-Treatment: Complexity of the patient's impairments (multi-system involvement);For patient/therapist safety;To address functional/ADL transfers PT goals addressed during session: Mobility/safety with mobility;Balance;Proper use of DME         AM-PAC PT "6 Clicks" Mobility  Outcome Measure Help needed turning from your back to your side while in a flat bed without using bedrails?: A Little Help needed moving from lying on your back to sitting on the side of a flat bed without using bedrails?: A Little Help needed moving to and from a bed to a chair (including a wheelchair)?: A Little Help needed standing up from a chair using your arms (e.g., wheelchair or bedside chair)?: A Little Help needed to walk in hospital room?: A Lot Help needed climbing 3-5 steps with a railing? : A Lot 6 Click Score: 16    End of Session Equipment Utilized During Treatment: Gait belt;Oxygen Activity Tolerance: Patient limited by fatigue;Treatment limited secondary to medical complications (Comment)(low BP ) Patient left: in chair;with call bell/phone within reach;with  chair alarm set Nurse Communication: Mobility status PT Visit Diagnosis: Unsteadiness on feet (R26.81);Muscle weakness (generalized) (M62.81);Other abnormalities of gait and mobility (R26.89);Difficulty in walking, not elsewhere classified (R26.2)    Time: 6979-4801 PT Time Calculation (min) (ACUTE ONLY): 24 min   Charges:   PT Evaluation $PT Eval Moderate Complexity: [place], PT, DPT  Acute Rehabilitation Services  Pager: [phone] Office: [phone]   Rudean Hitt 01/02/2019, 3:25 PM

## 2019-01-02 NOTE — Telephone Encounter (Signed)
Staff message received from Dr. Tamala [facility]:  [name] Furbish, MD  P [facility] Triage Pool        2 week followup with [facility] in trach clinic, f/u pleural effusion.    Routing to [employer] so he can coordinate getting pt scheduled for an appt.

## 2019-01-02 NOTE — Procedures (Signed)
Procedure: Left thoracentesis w/ US guidance Indication: breathlessness Consent in chart Description:  - Fluid pocket identified with [place] and overlying skin marked - Area prepped with chlorhexidine - 1% lidocaine used to anesthetize area - Thoracentesis catheter advanced into pleural space using usual sterile seldinger technique.  1400cc straw fluid removed. - No immediate complications - CXR ordered  Erskine Emery MD

## 2019-01-02 NOTE — Consult Note (Signed)
   [facility] CM Inpatient Consult   01/02/2019  [name] [birth year] [id number]    Patient reviewedfor readmission within 30 days with 3 hospitalizations in the past 6 months; has 29% high risk score for unplanned readmission;  and to check for potential [place] [place] [facility] services needsunderhis Medicare/ [employer] plan. .  Medical record review and MDnotedated 12/31/18, reveal as follows: [name] is a very pleasant 85 year old gentleman with medical history significant for heart failure with a preserved ejection fraction, atrial fibrillation, aortic stenosis status post TAVR, recent diagnosis of pulmonary embolism, obstructive pulmonary disease along with suspected parenchymal disease on spirometry, and recent admission for forearm infection, presenting with significant dyspnea as well as acute hypoxemic respiratory failure in setting of chronic hypoxemic respiratory failure.  The patient was readmitted in July for heart failure and COPD.  He was treated with prednisone and was briefly diuresed.   Review of transition of careSW note,showthat patient has been [facility] receiving ST rehab, and wife is agreeable with the patient returning to [facility] at discharge.  PT/ OT notes reviewed and are recommending return to SNF at d/c for continued rehab.  Pleaserefer to [facility] care managementifthere areany disposition changesforfollow-upas appropriate.  Of note, [facility] Care Management services does not replace or interfere with any services that are arranged by transition of care case management or social work.  Will notify [facility] post acute RN coordinator of patient's disposition for appropriate post discharge follow up of needs.   For questions and additional information, please call:  Anyi Fels A. Gaynor Genco, BSN, RN-BC [facility] Liaison Cell: [phone]

## 2019-01-02 NOTE — Discharge Summary (Addendum)
[facility] Discharge Summary  Patient name: [name] Medical record number: [id number] Date of birth: 1931-07-12 Age: 85 y.o. Gender: male Date of Admission: 12/31/2018  Date of Discharge: 01/06/19 Admitting Physician: Martyn Malay, MD  Primary Care Provider: Nicoletta Dress, MD Consultants: Pulmonology   Indication for Hospitalization JAHSIAH CARPENTER is a 85 y.o. male presenting with shortness of breath, felt to be likely multifactorial 2/2 COPD and CHF. PMH is significant for COPD on Home oxygen 3L, CHF, s/p TAVR, DM Type2, CAD s/p CABG, recent PE, chronic AFIb on Eliquis.   Discharge Diagnoses/Problem List:  CHF COPD requiring 3L home oxygen HLD HTN CAD s/p CABG '94 Atrial Fib. T2DM  Disposition: Skilled nursing facility  Discharge Condition: Medically stable for discharge  Discharge Exam:   General: Alert, pleasant, cooperative and appears to be in no acute distress, [place] 2L oxygen HEENT: Neck non-tender without lymphadenopathy, masses or thyromegaly Cardio: Normal S1 and S2, no S3 or S4. Rhythm is irregular. No murmurs or rubs.   Pulm: Few bibasal crackles, nowheezing, or diminished breath sounds. Normal respiratory effort Abdomen: Bowel sounds normal. Abdomen soft and non-tender. Foley catheter in situ, draining yellow, clear urine.  Extremities: No peripheral edema. Warm/ well perfused.  Neuro: Cranial nerves grossly intact  Brief Hospital Course:  Patient is an 85 year old male who presented with shortness of breath.  He has multiple admissions for the similar complaint over the past 1 month.  He apparently was doing well for few days after his last admission but then for the past 8 to 9 days before his current admission he had worsening cough, sputum production, dyspnea, and orthopnea.  He denied chest pain, nausea, vomiting, abdominal pain.  His work of breathing was increased where he could not carry on full conversations.  In the  ED the patient was worked up for COPD exacerbation, patient was given some Lasix as he has a component of heart failure as well with preserved ejection fraction.  Patient was placed on BiPAP.  Over the next day the patient was able to shift to nasal cannula is normal 3 L O2.  Pulmonology was consulted due to patient readmissions over the past month to optimize care. They carried out bilateral thoracocentesis and drained 1200 ml of clear pleural fluid was obtained. Sample was not sent. Pt was put on 80mg  Furosemide once daily.   High risk 12 month mortality Pt identified as having high risk mortality multiple life limiting co-morbidities.Discussed this consult with patient, however his wishes are to continue with rehab and aggressive care. Consider palliative consult in future.    Issues for Follow Up:  1. Ambulatory referral to urology on discharge for Foley catheter--needs changed by 8/20, ensure follow up for voiding trial  2. Have discontinued spironolactone in hospital due to symptomatic hypotension hospital 3. Please note high risk 12 month mortality as above 4. Has been referred for outpatient follow-up appointment following for COPD and chronic hypoxemic respiratory failure at [place]. Wife is aware of this and will contact them if she has not heard from them by 8/6 to contact their office 5. Recommend daily weights 6. Repeat BMP in 1 week 7. Repeat CBC in 1 week, consider further evaluation of anemia (normocytic) if persistent   Significant Procedures:  Thoracocentesis by Dr Ina Homes, draining 1.2L pleural fluid    Significant Labs and Imaging:  Recent Labs  Lab 01/02/19 0325 01/04/19 0339 01/05/19 0331  WBC 6.6 6.8 6.9  HGB  8.1* 8.9* 9.1*  HCT 25.7* 27.6* 27.5*  PLT 309 312 295   Recent Labs  Lab 01/01/19 0605  01/02/19 1328 01/03/19 0249 01/04/19 0339 01/05/19 0331 01/06/19 0509  NA 137   < > 135 137 135 135 136  K 4.1   < > 3.7 4.0 3.2* 3.8 4.2  CL 97*   < > 96*  99 99 100 98  CO2 27   < > 29 29 29 24 30   GLUCOSE 183*   < > 141* 121* 123* 127* 123*  BUN 15   < > 22 24* 23 22 20   CREATININE 0.77   < > 0.70 0.91 0.85 0.79 0.81  CALCIUM 8.9   < > 8.8* 8.8* 8.7* 8.6* 8.8*  MG  --   --   --  2.0 1.9 2.1 2.1  ALKPHOS 160*  --   --   --   --   --   --   AST 30  --   --   --   --   --   --   ALT 36  --   --   --   --   --   --   ALBUMIN 2.8*  --   --   --   --   --   --    < > = values in this interval not displayed.    Results/Tests Pending at Time of Discharge:   Discharge Medications:  Allergies as of 01/06/2019      Reactions   Bactrim [sulfamethoxazole-trimethoprim] Other (See Comments)   "Allergic," per [facility]   Levaquin [levofloxacin In D5w] Diarrhea, Other (See Comments)   "Allergic," per [facility]      Medication List    STOP taking these medications   acetaminophen-codeine 300-30 MG tablet Commonly known as: TYLENOL #3   methocarbamol 750 MG tablet Commonly known as: ROBAXIN   spironolactone 25 MG tablet Commonly known as: ALDACTONE   terazosin 2 MG capsule Commonly known as: HYTRIN     TAKE these medications   allopurinol 300 MG tablet Commonly known as: ZYLOPRIM Take 300 mg by mouth daily.   apixaban 5 MG Tabs tablet Commonly known as: ELIQUIS Take 1 tablet (5 mg total) by mouth 2 (two) times daily.   atorvastatin 80 MG tablet Commonly known as: LIPITOR Take 1 tablet (80 mg total) by mouth at bedtime.   benazepril 5 MG tablet Commonly known as: LOTENSIN Take 1 tablet (5 mg total) by mouth daily.   bisacodyl 5 MG EC tablet Commonly known as: DULCOLAX Take 1 tablet (5 mg total) by mouth daily.   brimonidine 0.2 % ophthalmic solution Commonly known as: ALPHAGAN Place 1 drop into both eyes 2 (two) times daily.   ferrous sulfate 325 (65 FE) MG tablet Take 325 mg by mouth daily with breakfast.   Foam Dressing Pads Apply 1 patch topically See admin instructions. Apply a foam dressing to sacrum every day AND as needed  for redness   furosemide 80 MG tablet Commonly known as: LASIX Take 1 tablet (80 mg total) by mouth daily. What changed:   medication strength  how much to take   HYDROcodone-acetaminophen 5-325 MG tablet Commonly known as: NORCO/VICODIN Take 1-2 tablets by mouth every 4 (four) hours as needed for moderate pain or severe pain.   lansoprazole 30 MG capsule Commonly known as: PREVACID Take 30 mg by mouth daily before breakfast.   levalbuterol 0.63 MG/3ML nebulizer solution Commonly known as: Penne Lash  Take 3 mLs (0.63 mg total) by nebulization every 6 (six) hours as needed for wheezing or shortness of breath. What changed: when to take this   metFORMIN 500 MG 24 hr tablet Commonly known as: GLUCOPHAGE-XR Take 500 mg by mouth daily.   metoprolol tartrate 25 MG tablet Commonly known as: LOPRESSOR Take 1 tablet (25 mg total) by mouth 2 (two) times daily. What changed: how much to take   multivitamin with minerals Tabs tablet Take 1 tablet by mouth daily.   NON FORMULARY Take 240 mLs by mouth See admin instructions. MedPass: Drink 240 ml's by mouth two times a day   NovoLOG FlexPen 100 UNIT/ML FlexPen Generic drug: insulin aspart Inject 2 Units into the skin See admin instructions. Inject 2 units into the skin at 9 AM in the morning for a BGL >200   OXYGEN Inhale 3-4 L/min into the lungs continuous. 3 liters/min continuously TO MAINTAIN SATS <90% and may increase to 4 liters/min as needed with dyspnea upon exertion or shortness of breath   polyethylene glycol 17 g packet Commonly known as: MIRALAX / GLYCOLAX Take 17 g by mouth 2 (two) times daily. What changed: additional instructions   Pro-Stat Liqd Take 30 mLs by mouth 2 (two) times a day.   Symbicort 80-4.5 MCG/ACT inhaler Generic drug: budesonide-formoterol Inhale 1 puff into the lungs as needed for shortness of breath.   tamsulosin 0.4 MG Caps capsule Commonly known as: FLOMAX Take 1 capsule (0.4 mg total) by  mouth daily after supper. What changed: when to take this   trolamine salicylate 10 % cream Commonly known as: ASPERCREME Apply 1 application topically every 6 (six) hours as needed for muscle pain.   umeclidinium bromide 62.5 MCG/INH Aepb Commonly known as: INCRUSE ELLIPTA Inhale 1 puff into the lungs daily. What changed: when to take this        Discharge Instructions: Please refer to Patient Instructions section of EMR for full details.  Patient was counseled important signs and symptoms that should prompt return to medical care, changes in medications, dietary instructions, activity restrictions, and follow up appointments.   Follow-Up Appointments: Please ensure pt follows up with urology for foley catheter.   Lattie Haw, MD 01/06/2019, 12:12 PM PGY-1, [place]

## 2019-01-02 NOTE — Progress Notes (Addendum)
Family Medicine Teaching Service Daily Progress Note   Patient name: [name] Medical record number: [id number] Date of birth: 1932-02-15 Age: 85 y.o. Gender: male  Primary Care Provider: Nicoletta Dress, MD Consultants: none Code Status: Full  Pt Overview and Major Events to Date:  Admitted 7/29  Assessment and Plan: [name] is a 85 y.o. male presenting with shortness of breath, felt to be likely multifactorial 2/2 COPD and CHF. PMH is significant for COPD on Home oxygen 3L, CHF, s/p TAVR, DM Type2, CAD s/p CABG, recent PE, chronic AFIb on Eliquis.   Acute on chronic hypoxic respiratory failure -improving Patient normally on 3 L of oxygen at home.  Recently noted a productive cough with thick yellow sputum and increasing orthopnea.  Patient without fever.  White blood cell count 6.6 7/31.  Pulmonology was consulted and attempted a trial of therapeutic tap to patient's chronic pleural effusion.  Drained 1200 mL from right. -Xopenex/ipratropium nebs q4 scheduled -Start Doxycycline 100 mg BID x5 days (7/29-8/2)  -Pulmonology on board appreciate recommendations as follows  -Increase Lasix and spironolactone as ordered, stop when creatinine bumped   -DC steroids  -BiPAP at night  -Restart cardiac diet and metoprolol  -Trial of therapeutic taps, if continued recurrence with diuresis consent consider palliative pleurX down the line  -If diuresis trial and thoracentesis do not work consider discussion of goals of care -Strict I and O's, daily weights 87.3kg 7/30  Atypical chest tightness -resolved Patient complained of some chest tightness with increased effort of breathing on admission.  High-sensitivity troponins of 27/34/27/26.  ED showed A. fib with left bundle branch block that was chronic, as well as downsloping ST depressions in V5/V6.  These were thought to be more secondary to arrhythmia and heart failure as they were present on prior EKGs.  Patient with history of  CAD status post CABG in 1994 -Continue home atorvastatin 80mg    Persistent A. Fib -somewhat controlled Heart rate in the range of 80s-100 on 7/31 -Patient on apixaban, metoprolol at home  -Cardiac monitoring - Metoprolol 25mg  tablet BID  HFpEF: Acute on chronic as discussed above.    Last ECHO (11/24/2018) LVEF 50-55% but LV function not evaluated due to Afib. Home meds Lasix 60 mg daily and Spironolactone 12.5 mg daily. Follows with [name], cardiology. Repeat ECHO showed ejection fraction of 50 to 55%, severely dilated left atria, moderate to severe mitral stenosis.  No significant change from echo completed on 11/24/2018. -Acute management as above  -BMP daily  COPD: Acute on chronic. Patient normally on 3 L of oxygen at home.  Uses Incruse daily and Symbicort as needed. -Continue management as above -Hold home inhalers -Consider adding a daily LABA to LAMA for baseline control. -Goal O2 sat 88 to 92%  Hypertension-stable BPs overnight 16R to 678L systolically.  On home medication of benazepril and metoprolol. -Continue home medications  Type 2 diabetes-stable Last A1c 6.1 (7/15).  On metformin 500 mg daily at home. Monitor blood glucose with meals and at night. -Add sliding scale insulin as patient may need these on steroids -Hold the home metformin while admitted.  GERD-stable Chronic. Patient on Protonix 40 mg daily at home -Continue home meds  BPH-chronic Patient failed a void trial during the last admission and needed Foley catheter placed prior to discharge. -Indwelling catheter in place -Continue home Flomax 0.4 mg  Chronic back pain-stable Takes Norco 5-325 mg every 4 hours as needed for pain at home. -Hold home medication  due to respiratory status -Tylenol 650 every 6 as needed -Lidocaine patch -K pad as needed  Recent provoked PE: Stable.  Diagnosed on 6/21 within distal R lower lobar PA. Received IV heparin, ultimately transitioned to treatment dose  eliquis. Now down to 5 BID, additional anti-coagulation for chronic A. Fib. No evidence of PE on CT performed on 7/15.   FEN/GI: Cardiac diet PPx: Eliquis  Disposition: Pending medical work-up.  Subjective:  On entering the room the patient states he feels "the best he has felt in a very long time".  He states he believes the improvement was from the drainage of the right sided pleural effusion.  Patient currently on 2 L nasal cannula without a noted increased work of breathing, patient is able to speak in full sentences this morning.  Objective: Temp:  [97.5 F (36.4 C)-98.3 F (36.8 C)] 98 F (36.7 C) (07/31 0836) Pulse Rate:  [80-108] 101 (07/31 0836) Resp:  [18-27] 27 (07/31 0836) BP: (96-160)/(50-82) 96/78 (07/31 0836) SpO2:  [97 %-100 %] 100 % (07/31 0836) FiO2 (%):  [40 %] 40 % (07/30 1202) Weight:  [85 kg] 85 kg (07/31 6378) Physical Exam: General: Alert and oriented in no apparent distress appears much better than yesterday. Heart: irregular rhythm Lungs: Breath sounds with some diffuse wheezes, minor crackles noted in right and left lower lobe.  No increased work of breathing.  Patient able to speak in full sentences this morning. Abdomen: Bowel sounds present Skin: Warm and dry Extremities: No lower extremity edema   Laboratory: Recent Labs  Lab 12/31/18 1515 01/01/19 0605 01/02/19 0325  WBC 7.1 3.2* 6.6  HGB 9.0* 10.0* 8.1*  HCT 28.3* 31.9* 25.7*  PLT 288 314 309   Recent Labs  Lab 12/31/18 1515 01/01/19 0605 01/02/19 0325  NA 134* 137 137  K 4.0 4.1 3.5  CL 96* 97* 97*  CO2 29 27 31   BUN 15 15 24*  CREATININE 0.63 0.77 0.72  CALCIUM 8.6* 8.9 8.4*  PROT  --  5.8*  --   BILITOT  --  1.1  --   ALKPHOS  --  160*  --   ALT  --  36  --   AST  --  30  --   GLUCOSE 140* 183* 157*       Imaging/Diagnostic Tests: Dg Chest 1 View  Result Date: 01/01/2019 CLINICAL DATA:  Post RIGHT thoracentesis EXAM: CHEST  1 VIEW COMPARISON:  Portable exam 1704  hours compared to 14/12 hours FINDINGS: Decreased RIGHT pleural effusion and basilar atelectasis post thoracentesis. No pneumothorax. Persistent LEFT pleural effusion and basilar atelectasis. Enlargement of cardiac silhouette with pulmonary vascular congestion and mild pulmonary edema. Post TAVR. Atherosclerotic calcifications aorta. IMPRESSION: No pneumothorax following RIGHT thoracentesis. Remainder of exam unchanged. Electronically Signed   By: Lavonia Dana M.D.   On: 01/01/2019 17:33   Ct Angio Chest Pe W And/or Wo Contrast  Result Date: 12/17/2018 CLINICAL DATA:  Shortness of breath. EXAM: CT ANGIOGRAPHY CHEST WITH CONTRAST TECHNIQUE: Multidetector CT imaging of the chest was performed using the standard protocol during bolus administration of intravenous contrast. Multiplanar CT image reconstructions and MIPs were obtained to evaluate the vascular anatomy. CONTRAST:  149mL OMNIPAQUE IOHEXOL 350 MG/ML SOLN COMPARISON:  CT scan of November 23, 2018. FINDINGS: Cardiovascular: Satisfactory opacification of the pulmonary arteries to the segmental level. No evidence of pulmonary embolism. Mild cardiomegaly is noted. Status post transcatheter aortic valve repair. Coronary artery calcifications are noted. Status post coronary bypass graft.  Atherosclerosis of thoracic aorta is noted without aneurysm formation. No pericardial effusion. Mediastinum/Nodes: No enlarged mediastinal, hilar, or axillary lymph nodes. Thyroid gland, trachea, and esophagus demonstrate no significant findings. Lungs/Pleura: No pneumothorax is noted. Moderate size bilateral pleural effusions are noted with adjacent subsegmental atelectasis of both lower lobes. Stable mild left upper lobe subsegmental atelectasis or scarring is noted. Upper Abdomen: No acute abnormality. Musculoskeletal: No chest wall abnormality. No acute or significant osseous findings. Review of the MIP images confirms the above findings. IMPRESSION: No definite evidence of  pulmonary embolus. Moderate bilateral pleural effusions are noted with adjacent subsegmental atelectasis. Status post coronary bypass grafting and transcatheter aortic valve replacement. Aortic Atherosclerosis (ICD10-I70.0).1 Electronically Signed   By: Marijo Conception M.D.   On: 12/17/2018 09:49   Dg Chest Port 1 View  Result Date: 12/31/2018 CLINICAL DATA:  Shortness of breath EXAM: PORTABLE CHEST 1 VIEW COMPARISON:  12/19/2018 FINDINGS: No significant change in mild, diffuse bilateral interstitial pulmonary opacity, small layering bilateral pleural effusions, and gross cardiomegaly. There is no new or focal airspace opacity. IMPRESSION: No significant change in mild, diffuse bilateral interstitial pulmonary opacity, small layering bilateral pleural effusions, and gross cardiomegaly. There is no new or focal airspace opacity. Electronically Signed   By: Eddie Candle M.D.   On: 12/31/2018 14:43   Dg Chest Port 1 View  Result Date: 12/19/2018 CLINICAL DATA:  Dyspnea EXAM: PORTABLE CHEST 1 VIEW COMPARISON:  12/17/2018 FINDINGS: Bilateral mild interstitial thickening. Small bilateral pleural effusions. No pneumothorax. Stable cardiomegaly. Prior CABG. Prior TAVR. No acute osseous abnormality. IMPRESSION: 1. Cardiomegaly with pulmonary vascular congestion and small bilateral pleural effusions. Electronically Signed   By: Kathreen Devoid   On: 12/19/2018 09:14   Dg Chest Portable 1 View  Result Date: 12/17/2018 CLINICAL DATA:  Shortness of breath for 3 days EXAM: PORTABLE CHEST 1 VIEW COMPARISON:  11/22/2018 FINDINGS: Chronic moderate pleural effusions obscuring the lower lungs. Chronic cardiomegaly status post transcatheter aortic valve replacement. No Kerley lines or air bronchogram. No pneumothorax IMPRESSION: Cardiomegaly and moderate pleural effusions also seen on imaging in June 2020. Electronically Signed   By: Monte Fantasia M.D.   On: 12/17/2018 06:31     Lurline Del, MD 01/02/2019, 9:25  AM PGY-1, [place]

## 2019-01-02 NOTE — Progress Notes (Signed)
Pt refused to wear bipap this pm. RT will continue to monitor.

## 2019-01-03 DIAGNOSIS — J42 Unspecified chronic bronchitis: Secondary | ICD-10-CM

## 2019-01-03 DIAGNOSIS — Z952 Presence of prosthetic heart valve: Secondary | ICD-10-CM

## 2019-01-03 LAB — BASIC METABOLIC PANEL
Anion gap: 9 (ref 5–15)
BUN: 24 mg/dL — ABNORMAL HIGH (ref 8–23)
CO2: 29 mmol/L (ref 22–32)
Calcium: 8.8 mg/dL — ABNORMAL LOW (ref 8.9–10.3)
Chloride: 99 mmol/L (ref 98–111)
Creatinine, Ser: 0.91 mg/dL (ref 0.61–1.24)
GFR calc Af Amer: >60 mL/min (ref >60)
GFR calc non Af Amer: >60 mL/min (ref >60)
Glucose, Bld: 121 mg/dL — ABNORMAL HIGH (ref 70–99)
Potassium: 4 mmol/L (ref 3.5–5.1)
Sodium: 137 mmol/L (ref 135–145)

## 2019-01-03 LAB — GLUCOSE, CAPILLARY
Glucose-Capillary: 110 mg/dL — ABNORMAL HIGH (ref 70–99)
Glucose-Capillary: 162 mg/dL — ABNORMAL HIGH (ref 70–99)
Glucose-Capillary: 144 mg/dL — ABNORMAL HIGH (ref 70–99)
Glucose-Capillary: 136 mg/dL — ABNORMAL HIGH (ref 70–99)

## 2019-01-03 LAB — MAGNESIUM: Magnesium: 2 mg/dL (ref 1.7–2.4)

## 2019-01-03 MED ORDER — FUROSEMIDE 80 MG PO TABS
80.0000 mg | ORAL_TABLET | Freq: Every day | ORAL | Status: DC
  Administered 2019-01-04 – 2019-01-06 (×3): 80 mg via ORAL
  Filled 2019-01-03 (×3): qty 1

## 2019-01-03 NOTE — Progress Notes (Signed)
Case reviwed with resident: pccm can sign off. Discussed lines of care involving optimization of deconditioning, copd and chf     SIGNATURE    Dr. Brand Males, M.D., F.C.C.P,  Pulmonary and Critical Care Medicine Staff Physician, [place] Director - Interstitial Lung Disease  Program  Pulmonary [place] at [place], [place], [address]  Pager: [phone], If no answer or between  15:00h - 7:00h: call [phone] Telephone: [phone]  12:02 PM 01/03/2019

## 2019-01-03 NOTE — Progress Notes (Signed)
Family Medicine Teaching Service Daily Progress Note Intern Pager: [phone]  Patient name: [name] Medical record number: [id number] Date of birth: January 07, 1932 Age: 85 y.o. Gender: male  Primary Care Provider: Nicoletta Dress, MD Consultants: Pulmonary Code Status: Full Code   Pt Overview and Major Events to Date:  Hospital Day: 5 12/31/2018: admitted for Shortness of Breath 7/30 - L thoracentesis  7/31- R thoracentesis  Assessment and Plan: [name] is a 85 y.o.  malepresenting with shortness of breath, felt to be likely multifactorial 2/2 COPD and CHF. PMH is significant forCOPD on Home oxygen3L, CHF,s/p TAVR,DM Type2,CAD s/p CABG, recent PE,chronicAFIb on Eliquis.   # A/C Hypoxic respiratory failure, improved  # A/C HFpEF exacerbation  #COPD  At baseline patient is on 3 L at home. Diuresed 3L overnight.  Much improved after recent thoracentesis of bilateral lung spaces draining nearly 3 L.  He is now on 2 L O2 and tolerated 2 L overnight while sleeping.  Typically requires BiPAP but is not compliant due to discomfort. No labs were run on pleural fluid. Holding home spironolactone, steroids. Follow-up chest x-rays show improved left base aeration and no further R effusion. PT/OT both recommending SNF for 24-hour assistance. Patient will return to [facility] to continue rehab, however as it is a weekend, patient will likely continue to stay in hospital until Monday.  O2 sats 88 to 92%  At discharge, recommend adding daily LABA and LAMA for baseline control  Xopenex/atrovent nebs TID and albuterol q4 PRN  Chest physiotherapy as needed  Transitioned to p.o. 80 mg once daily Lasix on 8/2 (s/p IV Lasix 40mg  TID)  Strict IO  Trend weights   #Goals of care discussion Patient has been identified as high risk mortality in the next 12 months. They have stressed the importance of encouraging family about advanced care planning and interventions moving  forward.  Attempt to complete MOST form  Place palliative referral at time of discharge or consult while inpatient  Patient requesting SNF  #A. fib, rate controlled, on apixaban and metoprolol HR 73-89 overnight. In afib.  Continue metoprolol 50 mg BID (Increased)   Eliquis 5mg  BID  Telemetry   #Anemia, chronic disease  Hgb 9-10 since admission. 8.1>8.9 8/2 . AM HGB  #Hypertension - BP 102/54 8/2, diuresed 3L ON #HLD  Continue home benazepril and metoprolol  Lasix plan: Transitioned to p.o. 80 mg once daily Lasix on 8/2  Continue home atorvastatin 80  #Hypokalemia: patient potassium 3.2 8/2.  Most likely due to increased Lasix dosing. -Replete with K-Dur   #IDDM, controlled A1c 6.1 most recently. Taking metformin 500 mg, novolog 2 units if >200 in AM daily at home. CBGs w/in normal limits.  Holding metformin while inpatient  #GERD  Continue home Protonix  #BPH with urinary retention, chronic At previous admission, patient failed voiding trial and required Foley placement at discharge.  Continue indwelling catheter, change q. Monthly  Tamsulosin 0.8 mg daily   #Chronic back pain At home, takes Norco 5/325 q4 hours PRN  Holding home medication in setting of respiratory status - Norco PRN  Watch for signs of withdrawal  Tylenol 650 mg every 6 hours as needed  Lidocaine patch  K pad as needed  #Recent pulmonary embolism, provoked  Eliquis 5 mg BID   #Gout  Patient was possibly having left ankle gout flair, but had no complaints of left ankle pain. No particular swelling appreciated on exam. . Continue home allopurinol   #Heel pressure  wound   Daily wound care  Elevate legs   Avoid pressure to area   #FEN/GI:  . Fluids: KVO . Electrolytes: wnl  . Nutrition: Heart healthy diet, 1500 mL  Access:  VTE prophylaxis: on Eliquis 5mg  BID  Disposition: SNF    Subjective:  Patient says he is "worn out" but breathing better. Says his left ankle  and his back "gave him a fit all night".   Objective: Temp:  [97.6 F (36.4 C)-98 F (36.7 C)] 97.6 F (36.4 C) (08/02 0800) Pulse Rate:  [73-107] 82 (08/02 0800) Cardiac Rhythm: Normal sinus rhythm (08/01 2130) Resp:  [16-30] 21 (08/02 0800) BP: (92-113)/(48-58) 109/48 (08/02 0800) SpO2:  [98 %-100 %] 100 % (08/02 0800) 08/01 0701 - 08/02 0700 In: 640 [P.O.:640] Out: 3150 [Urine:3150]   Physical Exam: General: No apparent distress, lying in bed, pleasant patient Cardiac: RRR, grade 2 systolic murmur appreciated  Pulmo: normal work of breathing on 2 L nasal cannula, bilateral lower lobe crackles appreciated, moving air well Abdomen: Soft, nondistended, nontender to palpation, bowel sounds auscultated in 4 quadrants Extremities: Brisk capillary refill to bilateral lower extremities, 2+ dorsalis pedis pulses appreciated bilaterally, right heel pressure wound wrapped in bandaging, heels elevated Neuro: No focal neurological deficits, 5/5 strength in upper and lower bilateral extremities  Laboratory: I have personally read and reviewed all labs and imaging studies.  CBC: Recent Labs  Lab 12/31/18 1515 01/01/19 0605 01/02/19 0325 01/04/19 0339  WBC 7.1 3.2* 6.6 6.8  NEUTROABS 5.3 2.7  --   --   HGB 9.0* 10.0* 8.1* 8.9*  HCT 28.3* 31.9* 25.7* 27.6*  MCV 88.4 88.4 87.7 86.3  PLT 288 314 309 312   CMP: Recent Labs  Lab 01/01/19 0605  01/02/19 1328 01/03/19 0249 01/04/19 0339  NA 137   < > 135 137 135  K 4.1   < > 3.7 4.0 3.2*  CL 97*   < > 96* 99 99  CO2 27   < > 29 29 29   GLUCOSE 183*   < > 141* 121* 123*  BUN 15   < > 22 24* 23  CREATININE 0.77   < > 0.70 0.91 0.85  CALCIUM 8.9   < > 8.8* 8.8* 8.7*  MG  --   --   --  2.0 1.9  ALBUMIN 2.8*  --   --   --   --    < > = values in this interval not displayed.     CBG: Recent Labs  Lab 01/03/19 0646 01/03/19 1138 01/03/19 1623 01/03/19 2120 01/04/19 0603  GLUCAP 110* 162* 144* 136* 113*    Imaging/Diagnostic  Tests: Dg Chest 1 View  Result Date: 01/02/2019 CLINICAL DATA:  85 year old male status post left side thoracentesis. EXAM: CHEST  1 VIEW COMPARISON:  Portable chest 01/01/2019 and earlier. FINDINGS: Portable AP upright view at 1157 hours. Improved left lung base ventilation and no pneumothorax following thoracentesis. Stable cardiomegaly and mediastinal contours. Sequelae of TAVR, CABG. The right lung is stable, and appears clear allowing for portable technique. IMPRESSION: No pneumothorax and improved left lung base ventilation following left thoracentesis. Electronically Signed   By: Genevie Ann M.D.   On: 01/02/2019 12:12    EKG Interpretation  Date/Time:  Wednesday December 31 2018 13:53:55 EDT Ventricular Rate:  100 PR Interval:    QRS Duration: 165 QT Interval:  386 QTC Calculation: 498 R Axis:   39 Text Interpretation:  Age not entered, assumed to be  85 years old for purpose of ECG interpretation Atrial fibrillation IVCD, consider atypical LBBB ST depression laterally Confirmed by Malvin Johns [phone]) on 12/31/2018 2:34:39 PM Also confirmed by Malvin Johns [phone]), editor Philomena Doheny [phone])  on 01/01/2019 6:53:59 AM      Procedures L thoracentesis and R thoracentesis   Daisy Floro, DO 01/04/2019, 8:35 AM PGY-2, [place] Intern pager: [phone], text pages welcome

## 2019-01-03 NOTE — Progress Notes (Signed)
Family Medicine Teaching Service Daily Progress Note Intern Pager: [phone]  Patient name: [name] Medical record number: [id number] Date of birth: 09/15/1931 Age: 85 y.o. Gender: male  Primary Care Provider: Nicoletta Dress, MD Consultants: Pulmonary Code Status: Full Code   Pt Overview and Major Events to Date:  Hospital Day: 4 12/31/2018: admitted for Shortness of Breath 7/30 - L thoracentesis  7/31- R thoracentesis  Assessment and Plan: [name] is a 85 y.o.  malepresenting with shortness of breath, felt to be likely multifactorial 2/2 COPD and CHF. PMH is significant forCOPD on Home oxygen3L, CHF,s/p TAVR,DM Type2,CAD s/p CABG, recent PE,chronicAFIb on Eliquis.   # A/C Hypoxic respiratory failure, improved  # A/C HFpEF exacerbation  #COPD  At baseline patient is on 3 L at home.  He is much improved after recent thoracentesis of bilateral lung spaces draining nearly 3 L.  He is now on 2 L O2 and tolerated 2 L overnight while sleeping.  Typically requires BiPAP but is not compliant due to discomfort.  Echo previously performed and not likely to be case of acute dyspnea.  No labs were run on pleural fluid.  The following previous therapies have been discontinued: spironolactone, steroids.  Acute chest tightness has resolved.  Follow-up chest x-rays show improved left base aeration and no further R effusion. Patient reports feeling "1,000 x better".  PT OT both recommending SNF for 24-hour assistance. Patient will return to [facility] to continue rehab, however as it is a weekend, patient will likely continue to stay in hospital until Monday.  O2 sats 88 to 92%  At discharge, recommend adding daily [place] and [place] for baseline control  XOPEnex/atrovent nebs TID and albuterol q4 PRN  Chest physiotherapy as needed  Continue IV Lasix 40 mg 3 times daily today  Transition to p.o. 80 mg once daily Lasix on 8/2  Strict IO  Trend weights   #Goals of care  discussion Patient has been identified as high risk mortality in the next 12 months.  They have stressed the importance of encouraging family about advanced care planning and interventions moving forward.  Attempt to complete MOST form  Can place palliative referral at time of discharge or consult while inpatient  Patient requesting SNF  #A. fib, on apixaban and metoprolol HR 77-108 overnight. In afib. BBB noted at 10pm yesterday.   Continue metoprolol 50 mg BID (Increased)   Eliquis 5mg  BID  Telemetry   #Anemia, chronic disease  Hgb 9-10 since admission. Today, 8.1.  . AM hgb   #Hypertension #HLD  Continue home benazepril and metoprolol  Lasix plan as above  Continue home atorvastatin 80  #IDDM, controlled A1c 6.1 most recently.  Taking metformin 500 mg, novolog 2 units if >200 in [place] at home.  Sugars are within normal limits.  Holding metformin while inpatient  #GERD  Continue home Protonix  #BPH, chronic At previous admission, patient failed voiding trial and required Foley placement at discharge.  Continue indwelling catheter, change q. Monthly  Increased to 0.8 mg daily   #Chronic back pain At home, takes Norco 5/325 every 4 hours as needed pain  Holding home medication in setting of respiratory status  Watch for signs of withdrawal  Tylenol 650 mg every 6 hours as needed  Can provide Norco if needed  Lidocaine patch  K pad as needed  #Recent pulmonary embolism, provoked  Currently on Eliquis 5 mg twice daily   #Gout  May be starting gout flair in left  ankle. Continue to monitor.  . Continue home allopurinol   #Heel pressure wound   Daily wound care  Elevate legs   Avoid pressure to area   #FEN/GI:  . Fluids: KVO . Electrolytes: wnl  . Nutrition: Heart healthy diet, 1500 mL  Access:  VTE prophylaxis: No VTE  Disposition: SNF     Subjective:  NAEO.  This morning, patient complains of some left heel  pain.  Objective: Temp:  [97.5 F (36.4 C)-98.1 F (36.7 C)] 98 F (36.7 C) (08/01 0848) Pulse Rate:  [77-108] 100 (08/01 0848) Cardiac Rhythm: Atrial fibrillation;Bundle branch block (07/31 2005) Resp:  [17-26] 25 (08/01 0848) BP: (90-125)/(51-58) 113/58 (08/01 0848) SpO2:  [96 %-100 %] 100 % (08/01 0848) Weight:  [82.3 kg] 82.3 kg (08/01 0447) 07/31 0701 - 08/01 0700 In: 480 [P.O.:480] Out: 3750 [Urine:3750]   Physical Exam: General: NAD, non-toxic, well-appearing, sitting comfortably in bed.   HEENT: [place]/AT. PERRLA. EOMI.  Cardiovascular: A. fib, gallop noted on auscultation. No BLEE Respiratory: Good air movement.  Bibasilar lung crackles on posterior auscultation.  Nasal cannula in place at 2 L. Abdomen: + BS. NT, ND, soft to palpation.  Extremities: Warm and well perfused. Moving spontaneously.  Tenderness to palpation of area just posterior to left lateral malleolus. Integumentary: No obvious rashes, lesions, trauma on general exam. Neuro: A & O x4. CN grossly intact. No FND  Laboratory: I have personally read and reviewed all labs and imaging studies.  CBC: Recent Labs  Lab 12/31/18 1515 01/01/19 0605 01/02/19 0325  WBC 7.1 3.2* 6.6  NEUTROABS 5.3 2.7  --   HGB 9.0* 10.0* 8.1*  HCT 28.3* 31.9* 25.7*  MCV 88.4 88.4 87.7  PLT 288 314 309   CMP: Recent Labs  Lab 01/01/19 0605 01/02/19 0325 01/02/19 1328 01/03/19 0249  NA 137 137 135 137  K 4.1 3.5 3.7 4.0  CL 97* 97* 96* 99  CO2 27 31 29 29   GLUCOSE 183* 157* 141* 121*  BUN 15 24* 22 24*  CREATININE 0.77 0.72 0.70 0.91  CALCIUM 8.9 8.4* 8.8* 8.8*  MG  --   --   --  2.0  ALBUMIN 2.8*  --   --   --      CBG: Recent Labs  Lab 01/02/19 0615 01/02/19 1155 01/02/19 1603 01/02/19 2128 01/03/19 0646  GLUCAP 139* 157* 106* 137* 110*     Imaging/Diagnostic Tests: Dg Chest 1 View  Result Date: 01/02/2019 CLINICAL DATA:  85 year old male status post left side thoracentesis. EXAM: CHEST  1 VIEW  COMPARISON:  Portable chest 01/01/2019 and earlier. FINDINGS: Portable AP upright view at 1157 hours. Improved left lung base ventilation and no pneumothorax following thoracentesis. Stable cardiomegaly and mediastinal contours. Sequelae of TAVR, CABG. The right lung is stable, and appears clear allowing for portable technique. IMPRESSION: No pneumothorax and improved left lung base ventilation following left thoracentesis. Electronically Signed   By: Genevie Ann M.D.   On: 01/02/2019 12:12   Dg Chest 1 View  Result Date: 01/01/2019 CLINICAL DATA:  Post RIGHT thoracentesis EXAM: CHEST  1 VIEW COMPARISON:  Portable exam 1704 hours compared to 14/12 hours FINDINGS: Decreased RIGHT pleural effusion and basilar atelectasis post thoracentesis. No pneumothorax. Persistent LEFT pleural effusion and basilar atelectasis. Enlargement of cardiac silhouette with pulmonary vascular congestion and mild pulmonary edema. Post TAVR. Atherosclerotic calcifications aorta. IMPRESSION: No pneumothorax following RIGHT thoracentesis. Remainder of exam unchanged. Electronically Signed   By: Lavonia Dana M.D.   On:  01/01/2019 17:33    EKG Interpretation  Date/Time:  Wednesday December 31 2018 13:53:55 EDT Ventricular Rate:  100 PR Interval:    QRS Duration: 165 QT Interval:  386 QTC Calculation: 498 R Axis:   39 Text Interpretation:  Age not entered, assumed to be  85 years old for purpose of ECG interpretation Atrial fibrillation IVCD, consider atypical LBBB ST depression laterally Confirmed by Malvin Johns [phone]) on 12/31/2018 2:34:39 PM Also confirmed by Malvin Johns [phone]), editor Philomena Doheny [phone])  on 01/01/2019 6:53:59 AM        Procedures L thoracentesis and R thoracentesis   Wilber Oliphant, MD 01/03/2019, 9:13 AM PGY-2, [place] Intern pager: [phone], text pages welcome

## 2019-01-03 NOTE — Progress Notes (Signed)
Pt states f/c was placed on prior admission. He states he was sent to "[facility]" with f/c and pt admitted this admission with f/c already in place.

## 2019-01-04 DIAGNOSIS — J449 Chronic obstructive pulmonary disease, unspecified: Secondary | ICD-10-CM

## 2019-01-04 DIAGNOSIS — I4891 Unspecified atrial fibrillation: Secondary | ICD-10-CM

## 2019-01-04 LAB — CBC
HCT: 27.6 % — ABNORMAL LOW (ref 39.0–52.0)
Hemoglobin: 8.9 g/dL — ABNORMAL LOW (ref 13.0–17.0)
MCH: 27.8 pg (ref 26.0–34.0)
MCHC: 32.2 g/dL (ref 30.0–36.0)
MCV: 86.3 fL (ref 80.0–100.0)
Platelets: 312 10*3/uL (ref 150–400)
RBC: 3.2 MIL/uL — ABNORMAL LOW (ref 4.22–5.81)
RDW: 15.6 % — ABNORMAL HIGH (ref 11.5–15.5)
WBC: 6.8 10*3/uL (ref 4.0–10.5)
nRBC: 0 % (ref 0.0–0.2)

## 2019-01-04 LAB — GLUCOSE, CAPILLARY
Glucose-Capillary: 113 mg/dL — ABNORMAL HIGH (ref 70–99)
Glucose-Capillary: 139 mg/dL — ABNORMAL HIGH (ref 70–99)
Glucose-Capillary: 174 mg/dL — ABNORMAL HIGH (ref 70–99)
Glucose-Capillary: 118 mg/dL — ABNORMAL HIGH (ref 70–99)

## 2019-01-04 LAB — MAGNESIUM: Magnesium: 1.9 mg/dL (ref 1.7–2.4)

## 2019-01-04 LAB — BASIC METABOLIC PANEL
Anion gap: 7 (ref 5–15)
BUN: 23 mg/dL (ref 8–23)
CO2: 29 mmol/L (ref 22–32)
Calcium: 8.7 mg/dL — ABNORMAL LOW (ref 8.9–10.3)
Chloride: 99 mmol/L (ref 98–111)
Creatinine, Ser: 0.85 mg/dL (ref 0.61–1.24)
GFR calc Af Amer: >60 mL/min (ref >60)
GFR calc non Af Amer: >60 mL/min (ref >60)
Glucose, Bld: 123 mg/dL — ABNORMAL HIGH (ref 70–99)
Potassium: 3.2 mmol/L — ABNORMAL LOW (ref 3.5–5.1)
Sodium: 135 mmol/L (ref 135–145)

## 2019-01-04 MED ORDER — RAMELTEON 8 MG PO TABS
8.0000 mg | ORAL_TABLET | Freq: Every day | ORAL | Status: DC
  Administered 2019-01-04 – 2019-01-05 (×2): 8 mg via ORAL
  Filled 2019-01-04 (×3): qty 1

## 2019-01-04 MED ORDER — POTASSIUM CHLORIDE CRYS ER 20 MEQ PO TBCR
40.0000 meq | EXTENDED_RELEASE_TABLET | Freq: Two times a day (BID) | ORAL | Status: DC
  Administered 2019-01-04: 40 meq via ORAL
  Filled 2019-01-04: qty 2

## 2019-01-04 MED ORDER — HYDROCODONE-ACETAMINOPHEN 5-325 MG PO TABS
1.0000 | ORAL_TABLET | Freq: Three times a day (TID) | ORAL | Status: DC | PRN
  Administered 2019-01-04 – 2019-01-06 (×4): 1 via ORAL
  Filled 2019-01-04 (×4): qty 1

## 2019-01-04 MED ORDER — IPRATROPIUM-ALBUTEROL 0.5-2.5 (3) MG/3ML IN SOLN
3.0000 mL | Freq: Three times a day (TID) | RESPIRATORY_TRACT | Status: DC
  Administered 2019-01-05 – 2019-01-06 (×4): 3 mL via RESPIRATORY_TRACT
  Filled 2019-01-04 (×4): qty 3

## 2019-01-04 MED ORDER — IPRATROPIUM-ALBUTEROL 0.5-2.5 (3) MG/3ML IN SOLN
3.0000 mL | Freq: Three times a day (TID) | RESPIRATORY_TRACT | Status: DC
  Administered 2019-01-04: 3 mL via RESPIRATORY_TRACT
  Filled 2019-01-04: qty 3

## 2019-01-04 NOTE — Progress Notes (Signed)
Team called and updated patient's wife and answered all questions.   [name] Blessed Girdner, DO PGY-3, [facility]

## 2019-01-04 NOTE — Progress Notes (Signed)
Patient noted 100% on 2l via [place].  Titrated to 1L via [place] x 5 mins & noted at 99%   Pt anxious about not being on 3 L.  Assured patient that an alarm will sound if his oxygen saturation gets low and we will be monitoring it

## 2019-01-04 NOTE — TOC Progression Note (Signed)
Transition of Care [facility] - Progression Note    Patient Details  Name: [name] MRN: [id number] Date of Birth: 1931/07/25  [facility]) CM/SW [place], LCSW Phone Number: 01/04/2019, 4:51 PM  Clinical Narrative:    CSW received a call for the MD stating that patient was ready for discharge. CSW called [facility] and could leave message however left a text. CSW has not received a response for [facility].  CSW will continue to follow for discharge planning.      Barriers to Discharge: Continued Medical Work up  Expected Discharge Plan and Services   In-house Referral: Clinical Social Work     Living arrangements for the past 2 months: [place], Single Family Home                                       Social Determinants of Health (SDOH) Interventions    Readmission Risk Interventions No flowsheet data found.

## 2019-01-05 DIAGNOSIS — I4819 Other persistent atrial fibrillation: Secondary | ICD-10-CM

## 2019-01-05 LAB — CBC
HCT: 27.5 % — ABNORMAL LOW (ref 39.0–52.0)
Hemoglobin: 9.1 g/dL — ABNORMAL LOW (ref 13.0–17.0)
MCH: 28.4 pg (ref 26.0–34.0)
MCHC: 33.1 g/dL (ref 30.0–36.0)
MCV: 85.9 fL (ref 80.0–100.0)
Platelets: 295 10*3/uL (ref 150–400)
RBC: 3.2 MIL/uL — ABNORMAL LOW (ref 4.22–5.81)
RDW: 15.5 % (ref 11.5–15.5)
WBC: 6.9 10*3/uL (ref 4.0–10.5)
nRBC: 0 % (ref 0.0–0.2)

## 2019-01-05 LAB — GLUCOSE, CAPILLARY
Glucose-Capillary: 120 mg/dL — ABNORMAL HIGH (ref 70–99)
Glucose-Capillary: 148 mg/dL — ABNORMAL HIGH (ref 70–99)
Glucose-Capillary: 101 mg/dL — ABNORMAL HIGH (ref 70–99)
Glucose-Capillary: 125 mg/dL — ABNORMAL HIGH (ref 70–99)

## 2019-01-05 LAB — BASIC METABOLIC PANEL
Anion gap: 11 (ref 5–15)
BUN: 22 mg/dL (ref 8–23)
CO2: 24 mmol/L (ref 22–32)
Calcium: 8.6 mg/dL — ABNORMAL LOW (ref 8.9–10.3)
Chloride: 100 mmol/L (ref 98–111)
Creatinine, Ser: 0.79 mg/dL (ref 0.61–1.24)
GFR calc Af Amer: >60 mL/min (ref >60)
GFR calc non Af Amer: >60 mL/min (ref >60)
Glucose, Bld: 127 mg/dL — ABNORMAL HIGH (ref 70–99)
Potassium: 3.8 mmol/L (ref 3.5–5.1)
Sodium: 135 mmol/L (ref 135–145)

## 2019-01-05 LAB — NOVEL CORONAVIRUS, NAA (HOSP ORDER, SEND-OUT TO REF LAB; TAT 18-24 HRS): SARS-CoV-2, NAA: NOT DETECTED

## 2019-01-05 LAB — MAGNESIUM: Magnesium: 2.1 mg/dL (ref 1.7–2.4)

## 2019-01-05 MED ORDER — FUROSEMIDE 80 MG PO TABS: 80.0000 mg | ORAL_TABLET | Freq: Every day | ORAL | 0 refills | Status: DC

## 2019-01-05 MED ORDER — METOPROLOL TARTRATE 50 MG PO TABS: 50.0000 mg | ORAL_TABLET | Freq: Two times a day (BID) | ORAL | Status: DC

## 2019-01-05 NOTE — Progress Notes (Signed)
Family Medicine Teaching Service Daily Progress Note Intern Pager: [phone]  Patient name: [name] Medical record number: [id number] Date of birth: 11-Jan-1932 Age: 85 y.o. Gender: male  Primary Care Provider: Nicoletta Dress, MD Consultants: Pulmonary Code Status: Full Code   Pt Overview and Major Events to Date:  Hospital Day: 6 12/31/2018: admitted for Shortness of Breath 7/30 - L thoracentesis  7/31- R thoracentesis  Assessment and Plan: [name] is a 85 y.o.  malepresenting with shortness of breath, felt to be likely multifactorial 2/2 COPD and CHF. PMH is significant forCOPD on Home oxygen3L, CHF,s/p TAVR,DM Type2,CAD s/p CABG, recent PE,chronicAFIb on Eliquis.   Hypoxic respiratory failure 2/2 HFpEF exacerbation COPD-chronic,improved Bibasal crackles on chest exam  Pt feels much iproved after recent thoracentesis of bilateral pleural effusions. -Weight on  82.3 kg 8/1,  87.3kg on 7/30 -Sats 98% on 2 L oxygen -At discharge, recommend adding daily LABA and LAMA for baseline control -Xopenex/atrovent nebs TID and albuterol q4 PRN -Chest physiotherapy as needed -Lasix 80 mg once daily -Strict I's and O's -Trend weights  -Patient awaiting return to [facility]-24-hour assistance -Update wife daily  Goals of care discussion Patient has been identified as high risk mortality in the next 12 months. They have stressed the importance of encouraging family about advanced care planning and interventions moving forward. -Attempt to complete MOST form -Place palliative referral at time of discharge or consult while inpatient -Patient requesting SNF  A. fib, rate controlled, on apixaban and metoprolol HR 77, A. Fib -Continue metoprolol 50 mg BID (Increased)  -Eliquis 5mg  BID -Telemetry   Anemia, chronic disease  Hgb 9.1 today, 8.9 on 8/2 -Continue to monitor Hb  Hypertension  HLD  BP 85/52 -103/47 -Continue home benazepril and metoprolol -Lasix 80 mg  once daily -Continue home atorvastatin 80mg   Hypokalemia K.  3.8 today, 3.2 on 8/2.  Most likely due to increased Lasix dosing. -Replete with K-Dur -Continue to monitor   IDDM, controlled A1c 6.1 most recently. Taking metformin 500 mg, novolog 2 units if >200 in AM daily at home.  -CBG 127 today -Holding metformin while inpatient  GERD Continue home Protonix  BPH with urinary retention, chronic At previous admission, patient failed voiding trial and required Foley placement at discharge. -Continue indwelling catheter, change q. Monthly -Tamsulosin 0.8 mg daily  -Urology follow-up as outpatient  Chronic back pain At home, takes Norco 5/325 q4 hours PRN -Tylenol 650 mg every 6 hours as needed -Lidocaine patch -K pad as needed -Norco 5/325 q4 hours PRN started on 8/3  Recent pulmonary embolism, provoked -Eliquis 5 mg BID   Gout  Patient was possibly having left ankle gout flair, but had no complaints of left ankle pain. No particular swelling appreciated on exam. -Continue home allopurinol   Heel pressure wound  -Daily wound care -Elevate legs  -Avoid pressure to area   FEN/GI:  -Fluids: KVO -Electrolytes: wnl  -Nutrition: Heart healthy diet, 1500 mL  Access:  VTE prophylaxis: on Eliquis 5mg  BID  Disposition: SNF    Subjective:  Pt cheerful and doing very well today. Feels significantly better compared to on admission. Denies chest pain, fevers, SOB or dizziness. Had some abdominal discomfort overnight because ?catheter was blocked. RN checked and was ok. Feels fine now. Eating and drinking well. Has not had a BM in a few days. Pain from bilateral heel wounds is improving follow support from pillows underneath. Looking forward to being discharged.  Objective: Temp:  [97.5 F (36.4  C)-98.1 F (36.7 C)] 97.5 F (36.4 C) (08/03 0605) Pulse Rate:  [71-93] 77 (08/03 0605) Cardiac Rhythm: Normal sinus rhythm (08/02 2032) Resp:  [18-30] 18 (08/03 0605) BP:  (95-112)/(47-71) 103/47 (08/03 0605) SpO2:  [98 %-100 %] 99 % (08/03 0605) 08/02 0701 - 08/03 0700 In: 720 [P.O.:720] Out: 980 [Urine:980]   Physical Exam: General: Alert, pleasant, cooperative and appears to be in no acute distress, [place] 2L oxygen HEENT: Neck non-tender without lymphadenopathy, masses or thyromegaly Cardio: Normal S1 and S2, no S3 or S4. Rhythm is irregular. No murmurs or rubs.   Pulm: Few bibasal crackles, nowheezing, or diminished breath sounds. Normal respiratory effort Abdomen: Bowel sounds normal. Abdomen soft and non-tender. Foley catheter in situ, draining yellow, clear urine.  Extremities: No peripheral edema. Warm/ well perfused.  Neuro: Cranial nerves grossly intact  Laboratory: I have personally read and reviewed all labs and imaging studies.  CBC: Recent Labs  Lab 12/31/18 1515 01/01/19 0605 01/02/19 0325 01/04/19 0339 01/05/19 0331  WBC 7.1 3.2* 6.6 6.8 6.9  NEUTROABS 5.3 2.7  --   --   --   HGB 9.0* 10.0* 8.1* 8.9* 9.1*  HCT 28.3* 31.9* 25.7* 27.6* 27.5*  MCV 88.4 88.4 87.7 86.3 85.9  PLT 288 314 309 312 295   CMP: Recent Labs  Lab 01/01/19 0605  01/03/19 0249 01/04/19 0339 01/05/19 0331  NA 137   < > 137 135 135  K 4.1   < > 4.0 3.2* 3.8  CL 97*   < > 99 99 100  CO2 27   < > 29 29 24   GLUCOSE 183*   < > 121* 123* 127*  BUN 15   < > 24* 23 22  CREATININE 0.77   < > 0.91 0.85 0.79  CALCIUM 8.9   < > 8.8* 8.7* 8.6*  MG  --   --  2.0 1.9 2.1  ALBUMIN 2.8*  --   --   --   --    < > = values in this interval not displayed.     CBG: Recent Labs  Lab 01/03/19 2120 01/04/19 0603 01/04/19 1137 01/04/19 1721 01/04/19 2155  GLUCAP 136* 113* 139* 174* 118*    Imaging/Diagnostic Tests: No results found.  EKG Interpretation  Date/Time:  Wednesday December 31 2018 13:53:55 EDT Ventricular Rate:  100 PR Interval:    QRS Duration: 165 QT Interval:  386 QTC Calculation: 498 R Axis:   39 Text Interpretation:  Age not entered, assumed to  be  85 years old for purpose of ECG interpretation Atrial fibrillation IVCD, consider atypical LBBB ST depression laterally Confirmed by Malvin Johns [phone]) on 12/31/2018 2:34:39 PM Also confirmed by Malvin Johns [phone]), editor Philomena Doheny [phone])  on 01/01/2019 6:53:59 AM      Procedures L thoracentesis and R thoracentesis   Lattie Haw, MD 01/05/2019, 6:31 AM PGY-1, [place] Intern pager: [phone], text pages welcome

## 2019-01-05 NOTE — TOC Progression Note (Addendum)
Transition of Care [facility]) - Progression Note    Patient Details  Name: SAVIO ALBRECHT MRN: [id number] Date of Birth: [birth year]  Transition of Care [facility]) CM/SW [place], [place] Phone Number: [phone] 01/05/2019, 12:20 PM  Clinical Narrative:    4:38pm-COVID results still pending. CSW will follow up in the morning as [facility] is unable to accept patient after 5pm.   12:20pm-CSW updated [facility] PG that patient is medically ready for discharge. They are able to accept patient once the COVID test results come back.    Expected Discharge Plan: Skilled Nursing Facility Barriers to Discharge: Other (comment)(COVID testing)  Expected Discharge Plan and Services Expected Discharge Plan: [place] In-house Referral: Clinical Social Work     Living arrangements for the past 2 months: [place], [place] Expected Discharge Date: 01/05/19               DME Arranged: N/A DME Agency: NA       HH Arranged: NA HH Agency: NA         Social Determinants of Health (SDOH) Interventions    Readmission Risk Interventions No flowsheet data found.

## 2019-01-05 NOTE — NC FL2 (Signed)
[place] LEVEL OF CARE SCREENING TOOL     IDENTIFICATION  Patient Name: [name] Birthdate: Oct 07, 1931 Sex: male Admission Date (Current Location): 12/31/2018  [employer] and [place] Number:  [employer] and Address:  [facility] [place] [place] [address], [place], [place] [address]      Provider Number: [id number]  Attending Physician Name and Address:  Martyn Malay, MD  Relative Name and Phone Number:  Kolston Lacount [phone]    Current Level of Care: Hospital Recommended Level of Care: [place] Prior Approval Number:    Date Approved/Denied:   PASRR Number: [id number]  Discharge Plan: SNF    Current Diagnoses: Patient Active Problem List   Diagnosis Date Noted  . S/P thoracentesis   . Dyspnea 12/31/2018  . CHF (congestive heart failure) ([place]) 12/17/2018  . Pressure injury of skin 12/17/2018  . Hypoxemia   . Pulmonary embolism and infarction ([place])   . Acute on chronic respiratory failure with hypoxia ([place])   . SOB (shortness of breath)   . Cellulitis 11/15/2018  . 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 Severe aortic stenosis 09/17/2017  . Mitral stenosis   . Aftercare following surgery of the circulatory system 03/15/2014  . Carotid stenosis 03/15/2014  . Unstable angina ([place]) 08/31/2013  . Acute myocardial infarction, unspecified site, initial episode of care   . Aortic stenosis   . Atrial fibrillation ([place])   . Vertigo 06/27/2013  . Chest pain with moderate risk of acute coronary syndrome 06/27/2013  . CAD - CABG '94, low risk Myoview May 2014 06/27/2013  . Chronic atrial fibrillation 06/27/2013  . Chronic anticoagulation 06/27/2013  . Gout attack- on steroid dose pack 06/27/2013  . Tachycardia- beta blocker increased 06/26/2013  . Essential hypertension, benign 03/10/2009  . HYPERLIPIDEMIA 09/09/2008  . UNSPECIFIED ANEMIA 09/09/2008  . CAROTID STENOSIS-  moderate 09/09/2008  . UNSPECIFIED CEREBROVASCULAR DISEASE 09/09/2008  . RENAL ARTERY STENOSIS- moderate 09/09/2008  . PERIPHERAL VASCULAR DISEASE 09/09/2008  . PEPTIC ULCER DISEASE, HX OF 09/09/2008    Orientation RESPIRATION BLADDER Height & Weight     Self, Time, Situation, Place  O2(Nasal cannula 2L) Continent, Indwelling catheter Weight: 181 lb 7 oz (82.3 kg) Height:  6' (182.9 cm)  BEHAVIORAL SYMPTOMS/MOOD NEUROLOGICAL BOWEL NUTRITION STATUS      Continent Diet(Please see DC Summary)  AMBULATORY STATUS COMMUNICATION OF NEEDS Skin   Extensive Assist Verbally Normal                       Personal Care Assistance Level of Assistance  Bathing, Feeding, Dressing Bathing Assistance: Maximum assistance Feeding assistance: Limited assistance Dressing Assistance: Limited assistance     Functional Limitations Info  Sight, Hearing, Speech Sight Info: Impaired Hearing Info: Impaired Speech Info: Adequate    SPECIAL CARE FACTORS FREQUENCY  PT (By licensed PT), OT (By licensed OT)     PT Frequency: 5x/week OT Frequency: 5x/week            Contractures Contractures Info: Not present    Additional Factors Info  Code Status, Allergies, Insulin Sliding Scale Code Status Info: Full Allergies Info: Bactrim (Sulfamethoxazole-trimethoprim), Levaquin (Levofloxacin In D5w)   Insulin Sliding Scale Info: See DC Summary for dose       Current Medications (01/05/2019):  This is the current hospital active medication list Current Facility-Administered Medications  Medication Dose Route Frequency Provider Last Rate Last Dose  . 0.9 %  sodium chloride infusion  250 mL Intravenous PRN Darrelyn Hillock N, DO      . acetaminophen (TYLENOL) tablet 650 mg  650 mg Oral Q4H PRN Darrelyn Hillock N, DO   650 mg at 01/03/19 2317  . albuterol (PROVENTIL) (2.5 MG/3ML) 0.083% nebulizer solution 2.5 mg  2.5 mg Nebulization Q4H PRN Martyn Malay, MD      . allopurinol (ZYLOPRIM) tablet 300 mg   300 mg Oral Daily Beard, Samantha N, DO   300 mg at 01/05/19 1011  . apixaban (ELIQUIS) tablet 5 mg  5 mg Oral BID Higinio Plan, Samantha N, DO   5 mg at 01/05/19 1010  . atorvastatin (LIPITOR) tablet 80 mg  80 mg Oral QHS Darrelyn Hillock N, DO   80 mg at 01/04/19 2155  . benazepril (LOTENSIN) tablet 5 mg  5 mg Oral Daily Beard, Samantha N, DO   5 mg at 01/05/19 1009  . brimonidine (ALPHAGAN) 0.2 % ophthalmic solution 1 drop  1 drop Both Eyes BID Higinio Plan, Samantha N, DO   1 drop at 01/05/19 1013  . feeding supplement (GLUCERNA SHAKE) (GLUCERNA SHAKE) liquid 237 mL  237 mL Oral TID BM Martyn Malay, MD   237 mL at 01/05/19 1012  . furosemide (LASIX) tablet 80 mg  80 mg Oral Daily Wilber Oliphant, MD   80 mg at 01/05/19 1011  . HYDROcodone-acetaminophen (NORCO/VICODIN) 5-325 MG per tablet 1 tablet  1 tablet Oral Q8H PRN Shirley, [place], DO   1 tablet at 01/04/19 2155  . insulin aspart (novoLOG) injection 0-9 Units  0-9 Units Subcutaneous TID WC Darrelyn Hillock N, DO   2 Units at 01/04/19 1749  . ipratropium-albuterol (DUONEB) 0.5-2.5 (3) MG/3ML nebulizer solution 3 mL  3 mL Nebulization TID Martyn Malay, MD   3 mL at 01/05/19 0746  . metoprolol tartrate (LOPRESSOR) tablet 50 mg  50 mg Oral BID Candee Furbish, MD   50 mg at 01/05/19 1010  . ondansetron (ZOFRAN) injection 4 mg  4 mg Intravenous Q6H PRN Higinio Plan, Samantha N, DO      . pantoprazole (PROTONIX) EC tablet 20 mg  20 mg Oral Daily Beard, Samantha N, DO   20 mg at 01/05/19 1010  . ramelteon (ROZEREM) tablet 8 mg  8 mg Oral QHS Martyn Malay, MD   8 mg at 01/04/19 2154  . sodium chloride flush (NS) 0.9 % injection 3 mL  3 mL Intravenous Q12H Beard, Samantha N, DO   3 mL at 01/05/19 1012  . sodium chloride flush (NS) 0.9 % injection 3 mL  3 mL Intravenous PRN Darrelyn Hillock N, DO      . tamsulosin (FLOMAX) capsule 0.8 mg  0.8 mg Oral QPM Anderson, Chelsey L, DO   0.8 mg at 01/04/19 1750     Discharge Medications: Please see discharge summary for a  list of discharge medications.  Relevant Imaging Results:  Relevant Lab Results:   Additional Information SSN [id number]  Benard Halsted, LCSW

## 2019-01-05 NOTE — Progress Notes (Signed)
Physical Therapy Treatment Patient Details Name: [name] MRN: [id number] DOB: 13-Sep-1931 Today's Date: 01/05/2019    History of Present Illness Pt is an 85 y/o male admitted from SNF secondary to worsening SOB. Pt is s/p thoracentesis x2. PMH includes DM, CAD s/p CABG, COPD on home O2, CHF, s/p TAVR, and a fib.    PT Comments    Pt admitted with above diagnosis. Pt currently with functional limitations due to balance and endurance deficits. Pt stated he didn't want to get up as he has been up multiple x today.  Performed exercises in bed with pt doing well with exercises.  Pt will benefit from skilled PT to increase their independence and safety with mobility to allow discharge to the venue listed below.     Follow Up Recommendations  SNF;Supervision/Assistance - 24 hour     Equipment Recommendations  None recommended by PT    Recommendations for Other Services OT consult     Precautions / Restrictions Precautions Precautions: Fall;Other (comment) Precaution Comments: watch BP  Restrictions Weight Bearing Restrictions: No Other Position/Activity Restrictions: L forearm with dressing    Mobility  Bed Mobility               General bed mobility comments: refused OOB as he states he was up earlier and his buttocks hurts from sitting in chair.   Transfers                    Ambulation/Gait                 Stairs             Wheelchair Mobility    Modified Rankin (Stroke Patients Only)       Balance                                            Cognition Arousal/Alertness: Awake/alert Behavior During Therapy: WFL for tasks assessed/performed Overall Cognitive Status: Within Functional Limits for tasks assessed                                        Exercises General Exercises - Upper Extremity Shoulder Flexion: AROM;Both;10 reps;Supine Shoulder Horizontal ADduction: AROM;Both;10  reps;Supine Elbow Flexion: AROM;Both;10 reps;Supine Elbow Extension: AROM;Both;10 reps;Supine General Exercises - Lower Extremity Ankle Circles/Pumps: AROM;Both;10 reps;Supine Other Exercises Other Exercises: Shoulder presses x 15    General Comments        Pertinent Vitals/Pain Pain Assessment: Faces Faces Pain Scale: Hurts even more Pain Location: buttocks  Pain Descriptors / Indicators: Sore Pain Intervention(s): Limited activity within patient's tolerance;Monitored during session;Repositioned    Home Living                      Prior Function            PT Goals (current goals can now be found in the care plan section) Acute Rehab PT Goals Patient Stated Goal: to go back to [facility] so I can ge stronger Progress towards PT goals: Progressing toward goals    Frequency    Min 2X/week      PT Plan Current plan remains appropriate    Co-evaluation              AM-PAC PT "  6 Clicks" Mobility   Outcome Measure  Help needed turning from your back to your side while in a flat bed without using bedrails?: A Little Help needed moving from lying on your back to sitting on the side of a flat bed without using bedrails?: A Little Help needed moving to and from a bed to a chair (including a wheelchair)?: A Little Help needed standing up from a chair using your arms (e.g., wheelchair or bedside chair)?: A Little Help needed to walk in hospital room?: A Lot Help needed climbing 3-5 steps with a railing? : A Lot 6 Click Score: 16    End of Session Equipment Utilized During Treatment: Gait belt;Oxygen Activity Tolerance: Patient limited by fatigue Patient left: with call bell/phone within reach;in bed Nurse Communication: Mobility status PT Visit Diagnosis: Unsteadiness on feet (R26.81);Muscle weakness (generalized) (M62.81);Other abnormalities of gait and mobility (R26.89);Difficulty in walking, not elsewhere classified (R26.2) Pain - Right/Left: Left Pain -  part of body: Arm     Time: 5300-5110 PT Time Calculation (min) (ACUTE ONLY): 11 min  Charges:  $Therapeutic Exercise: 8-22 mins                     [name],PT Acute Rehabilitation Services Pager:  [phone]  Office:  [place] 01/05/2019, 4:03 PM

## 2019-01-05 NOTE — Plan of Care (Signed)
Continue to monitor

## 2019-01-06 DIAGNOSIS — I2510 Atherosclerotic heart disease of native coronary artery without angina pectoris: Secondary | ICD-10-CM | POA: Diagnosis present

## 2019-01-06 DIAGNOSIS — M109 Gout, unspecified: Secondary | ICD-10-CM | POA: Diagnosis present

## 2019-01-06 DIAGNOSIS — I509 Heart failure, unspecified: Secondary | ICD-10-CM | POA: Diagnosis not present

## 2019-01-06 DIAGNOSIS — R339 Retention of urine, unspecified: Secondary | ICD-10-CM

## 2019-01-06 DIAGNOSIS — G8929 Other chronic pain: Secondary | ICD-10-CM | POA: Diagnosis present

## 2019-01-06 DIAGNOSIS — I499 Cardiac arrhythmia, unspecified: Secondary | ICD-10-CM | POA: Diagnosis not present

## 2019-01-06 DIAGNOSIS — K30 Functional dyspepsia: Secondary | ICD-10-CM | POA: Diagnosis not present

## 2019-01-06 DIAGNOSIS — R0600 Dyspnea, unspecified: Secondary | ICD-10-CM | POA: Diagnosis not present

## 2019-01-06 DIAGNOSIS — J9621 Acute and chronic respiratory failure with hypoxia: Secondary | ICD-10-CM | POA: Diagnosis present

## 2019-01-06 DIAGNOSIS — R823 Hemoglobinuria: Secondary | ICD-10-CM | POA: Diagnosis present

## 2019-01-06 DIAGNOSIS — J90 Pleural effusion, not elsewhere classified: Secondary | ICD-10-CM | POA: Diagnosis not present

## 2019-01-06 DIAGNOSIS — R0602 Shortness of breath: Secondary | ICD-10-CM | POA: Diagnosis not present

## 2019-01-06 DIAGNOSIS — E8809 Other disorders of plasma-protein metabolism, not elsewhere classified: Secondary | ICD-10-CM | POA: Diagnosis not present

## 2019-01-06 DIAGNOSIS — Z515 Encounter for palliative care: Secondary | ICD-10-CM | POA: Diagnosis not present

## 2019-01-06 DIAGNOSIS — I050 Rheumatic mitral stenosis: Secondary | ICD-10-CM | POA: Diagnosis not present

## 2019-01-06 DIAGNOSIS — J810 Acute pulmonary edema: Secondary | ICD-10-CM | POA: Diagnosis not present

## 2019-01-06 DIAGNOSIS — D649 Anemia, unspecified: Secondary | ICD-10-CM | POA: Diagnosis present

## 2019-01-06 DIAGNOSIS — J9690 Respiratory failure, unspecified, unspecified whether with hypoxia or hypercapnia: Secondary | ICD-10-CM | POA: Diagnosis not present

## 2019-01-06 DIAGNOSIS — J189 Pneumonia, unspecified organism: Secondary | ICD-10-CM | POA: Diagnosis present

## 2019-01-06 DIAGNOSIS — R8271 Bacteriuria: Secondary | ICD-10-CM | POA: Diagnosis present

## 2019-01-06 DIAGNOSIS — J9811 Atelectasis: Secondary | ICD-10-CM | POA: Diagnosis present

## 2019-01-06 DIAGNOSIS — R0689 Other abnormalities of breathing: Secondary | ICD-10-CM | POA: Diagnosis not present

## 2019-01-06 DIAGNOSIS — I4821 Permanent atrial fibrillation: Secondary | ICD-10-CM | POA: Diagnosis present

## 2019-01-06 DIAGNOSIS — Z960 Presence of urogenital implants: Secondary | ICD-10-CM

## 2019-01-06 DIAGNOSIS — L89159 Pressure ulcer of sacral region, unspecified stage: Secondary | ICD-10-CM | POA: Diagnosis present

## 2019-01-06 DIAGNOSIS — I4891 Unspecified atrial fibrillation: Secondary | ICD-10-CM | POA: Diagnosis not present

## 2019-01-06 DIAGNOSIS — E119 Type 2 diabetes mellitus without complications: Secondary | ICD-10-CM | POA: Diagnosis not present

## 2019-01-06 DIAGNOSIS — J9611 Chronic respiratory failure with hypoxia: Secondary | ICD-10-CM | POA: Diagnosis not present

## 2019-01-06 DIAGNOSIS — I272 Pulmonary hypertension, unspecified: Secondary | ICD-10-CM | POA: Diagnosis not present

## 2019-01-06 DIAGNOSIS — J449 Chronic obstructive pulmonary disease, unspecified: Secondary | ICD-10-CM | POA: Diagnosis not present

## 2019-01-06 DIAGNOSIS — Z794 Long term (current) use of insulin: Secondary | ICD-10-CM | POA: Diagnosis not present

## 2019-01-06 DIAGNOSIS — N400 Enlarged prostate without lower urinary tract symptoms: Secondary | ICD-10-CM | POA: Diagnosis present

## 2019-01-06 DIAGNOSIS — Z7189 Other specified counseling: Secondary | ICD-10-CM | POA: Diagnosis not present

## 2019-01-06 DIAGNOSIS — G4733 Obstructive sleep apnea (adult) (pediatric): Secondary | ICD-10-CM | POA: Diagnosis not present

## 2019-01-06 DIAGNOSIS — K5900 Constipation, unspecified: Secondary | ICD-10-CM | POA: Diagnosis present

## 2019-01-06 DIAGNOSIS — E1151 Type 2 diabetes mellitus with diabetic peripheral angiopathy without gangrene: Secondary | ICD-10-CM | POA: Diagnosis present

## 2019-01-06 DIAGNOSIS — R0902 Hypoxemia: Secondary | ICD-10-CM | POA: Diagnosis not present

## 2019-01-06 DIAGNOSIS — J918 Pleural effusion in other conditions classified elsewhere: Secondary | ICD-10-CM | POA: Diagnosis not present

## 2019-01-06 DIAGNOSIS — I482 Chronic atrial fibrillation, unspecified: Secondary | ICD-10-CM | POA: Diagnosis not present

## 2019-01-06 DIAGNOSIS — Z209 Contact with and (suspected) exposure to unspecified communicable disease: Secondary | ICD-10-CM | POA: Diagnosis not present

## 2019-01-06 DIAGNOSIS — R0603 Acute respiratory distress: Secondary | ICD-10-CM | POA: Diagnosis not present

## 2019-01-06 DIAGNOSIS — Z20828 Contact with and (suspected) exposure to other viral communicable diseases: Secondary | ICD-10-CM | POA: Diagnosis present

## 2019-01-06 DIAGNOSIS — I5031 Acute diastolic (congestive) heart failure: Secondary | ICD-10-CM | POA: Diagnosis not present

## 2019-01-06 DIAGNOSIS — I4811 Longstanding persistent atrial fibrillation: Secondary | ICD-10-CM | POA: Diagnosis not present

## 2019-01-06 DIAGNOSIS — I10 Essential (primary) hypertension: Secondary | ICD-10-CM | POA: Diagnosis not present

## 2019-01-06 DIAGNOSIS — Y95 Nosocomial condition: Secondary | ICD-10-CM | POA: Diagnosis present

## 2019-01-06 DIAGNOSIS — I5033 Acute on chronic diastolic (congestive) heart failure: Secondary | ICD-10-CM | POA: Diagnosis not present

## 2019-01-06 DIAGNOSIS — Z7901 Long term (current) use of anticoagulants: Secondary | ICD-10-CM | POA: Diagnosis not present

## 2019-01-06 DIAGNOSIS — K219 Gastro-esophageal reflux disease without esophagitis: Secondary | ICD-10-CM | POA: Diagnosis present

## 2019-01-06 DIAGNOSIS — M549 Dorsalgia, unspecified: Secondary | ICD-10-CM | POA: Diagnosis present

## 2019-01-06 DIAGNOSIS — Z9981 Dependence on supplemental oxygen: Secondary | ICD-10-CM | POA: Diagnosis not present

## 2019-01-06 DIAGNOSIS — I110 Hypertensive heart disease with heart failure: Secondary | ICD-10-CM | POA: Diagnosis present

## 2019-01-06 DIAGNOSIS — I2699 Other pulmonary embolism without acute cor pulmonale: Secondary | ICD-10-CM | POA: Diagnosis not present

## 2019-01-06 DIAGNOSIS — E46 Unspecified protein-calorie malnutrition: Secondary | ICD-10-CM | POA: Diagnosis not present

## 2019-01-06 DIAGNOSIS — E785 Hyperlipidemia, unspecified: Secondary | ICD-10-CM | POA: Diagnosis present

## 2019-01-06 DIAGNOSIS — J440 Chronic obstructive pulmonary disease with acute lower respiratory infection: Secondary | ICD-10-CM | POA: Diagnosis present

## 2019-01-06 DIAGNOSIS — I25119 Atherosclerotic heart disease of native coronary artery with unspecified angina pectoris: Secondary | ICD-10-CM | POA: Diagnosis not present

## 2019-01-06 LAB — BASIC METABOLIC PANEL
Anion gap: 8 (ref 5–15)
BUN: 20 mg/dL (ref 8–23)
CO2: 30 mmol/L (ref 22–32)
Calcium: 8.8 mg/dL — ABNORMAL LOW (ref 8.9–10.3)
Chloride: 98 mmol/L (ref 98–111)
Creatinine, Ser: 0.81 mg/dL (ref 0.61–1.24)
GFR calc Af Amer: >60 mL/min (ref >60)
GFR calc non Af Amer: >60 mL/min (ref >60)
Glucose, Bld: 123 mg/dL — ABNORMAL HIGH (ref 70–99)
Potassium: 4.2 mmol/L (ref 3.5–5.1)
Sodium: 136 mmol/L (ref 135–145)

## 2019-01-06 LAB — MAGNESIUM: Magnesium: 2.1 mg/dL (ref 1.7–2.4)

## 2019-01-06 LAB — GLUCOSE, CAPILLARY
Glucose-Capillary: 118 mg/dL — ABNORMAL HIGH (ref 70–99)
Glucose-Capillary: 141 mg/dL — ABNORMAL HIGH (ref 70–99)

## 2019-01-06 MED ORDER — TAMSULOSIN HCL 0.4 MG PO CAPS: 0.4000 mg | ORAL_CAPSULE | Freq: Every evening | ORAL | Status: DC

## 2019-01-06 MED ORDER — METOPROLOL TARTRATE 25 MG PO TABS: 25.0000 mg | ORAL_TABLET | Freq: Two times a day (BID) | ORAL | Status: DC

## 2019-01-06 MED ORDER — IPRATROPIUM-ALBUTEROL 0.5-2.5 (3) MG/3ML IN SOLN: 3.0000 mL | Freq: Two times a day (BID) | RESPIRATORY_TRACT | Status: DC

## 2019-01-06 MED ORDER — METOPROLOL TARTRATE 25 MG PO TABS
25.0000 mg | ORAL_TABLET | Freq: Two times a day (BID) | ORAL | Status: DC
  Administered 2019-01-06: 25 mg via ORAL
  Filled 2019-01-06: qty 1

## 2019-01-06 MED ORDER — HYDROCODONE-ACETAMINOPHEN 5-325 MG PO TABS: 1.0000 | ORAL_TABLET | ORAL | 0 refills | Status: DC | PRN

## 2019-01-06 NOTE — Progress Notes (Addendum)
Patient's BP 95/59 HR 72. Metoprolol held. BP's have been soft. Dr. Posey Pronto notified.   6063: BP recheck: 105/52 HR 96. New metoprolol dose of 25 mg given at this time. Will continue to monitor.

## 2019-01-06 NOTE — Plan of Care (Signed)
Patient discharging to [facility] at this time

## 2019-01-06 NOTE — Progress Notes (Addendum)
Family Medicine Teaching Service Daily Progress Note Intern Pager: [phone]  Patient name: [name] Medical record number: [id number] Date of birth: 12-18-1931 Age: 85 y.o. Gender: male  Primary Care Provider: Nicoletta Dress, MD Consultants: Pulmonary Code Status: Full Code   Pt Overview and Major Events to Date:  Hospital Day: 7 12/31/2018: admitted for Shortness of Breath 7/30 - L thoracentesis  7/31- R thoracentesis  Assessment and Plan: [name] is a 85 y.o.  malepresenting with shortness of breath, felt to be likely multifactorial 2/2 COPD and CHF. PMH is significant forCOPD on Home oxygen3L, CHF,s/p TAVR,DM Type2,CAD s/p CABG, recent PE,chronicAFIb on Eliquis.   Hypoxic respiratory failure 2/2 HFpEF exacerbation COPD-chronic, stable Patient feels well denies dyspnea. Chest clear on exam -Sats 100% on 2 L oxygen Weight on  81.3 kg on 8/4, 82.3 kg on 8/1, 87.3kg on admission Urine output 1.25 L on 8/3 COVID negative -Continue Lasix 80 mg once daily, metoprolol 25 mg twice daily -Strict I's and O's -Trend weights  -We will return to [facility] today-24-hour assistance -Update wife daily  Goals of care discussion Patient has been identified as high risk mortality in the next 12 months. They have stressed the importance of encouraging family about advanced care planning and interventions moving forward. -Attempt to complete MOST form -Place palliative referral at time of discharge or consult while inpatient -Patient requesting SNF  A. fib, rate controlled, on apixaban and metoprolol HR 78, A. Fib Metoprolol dose decreased from 50 mg to 25 mg twice daily due to hypotensive blood pressures -Eliquis 5mg  BID -Telemetry   Anemia, chronic disease  Hgb 9.1 on 8/3, stable -Continue to monitor Hb  Hypertension  HLD  RN paged about low blood pressures this morning and patient was feeling dizzy on standing yesterday.  BP 85/52 -103/47 -Continue home  benazepril -Metoprolol dose decreased from 50 mg to 25 mg twice daily -Lasix 80 mg once daily -Continue home atorvastatin 80mg   Hypokalemia K.  4.2 on today, 3.8 on 8/3.  -Replete with K-Dur flow -Continue to monitor   IDDM, controlled HbA1c 6.1 most recently. Taking metformin 500 mg, novolog 2 units if >200 in AM daily at home.  CBG 123 today -Holding metformin while inpatient  GERD Continue home Protonix  BPH with urinary retention, chronic -Continue indwelling catheter, change q. Monthly -Tamsulosin dose reduced from 0.8 mg to 0.4 mg today due to hypotensive blood pressures -Urology follow-up as outpatient  Chronic back pain At home, takes Norco 5/325 q4 hours PRN -Tylenol 650 mg every 6 hours as needed -Lidocaine patch -K pad as needed -Norco 5/325 q4 hours PRN started on 8/3  Recent pulmonary embolism, provoked -Eliquis 5 mg BID   Gout  Patient was possibly having left ankle gout flair, but had no complaints of left ankle pain. No particular swelling appreciated on exam. -Continue home allopurinol   Heel pressure wound  -Daily wound care -Elevate legs  -Avoid pressure to area   FEN/GI:  -Fluids: KVO -Electrolytes: wnl  -Nutrition: Heart healthy diet, 1500 mL  Access:  VTE prophylaxis: on Eliquis 5mg  BID  Disposition: SNF    Subjective:  Feels well this morning, had a good night sleep and ramelteon has helped. denies dyspnea, chest pain or fevers.  Bilateral ankle wounds are less symptomatic than on previous admission. no new concerns. Patient is looking forward to being discharged to SNF.  Objective: Temp:  [97.7 F (36.5 C)-98 F (36.7 C)] 97.9 F (36.6 C) (08/04 0455)  Pulse Rate:  [76-85] 79 (08/04 0620) Cardiac Rhythm: Normal sinus rhythm (08/04 0400) Resp:  [18-24] 24 (08/04 0620) BP: (87-106)/(45-57) 88/52 (08/03 2340) SpO2:  [98 %-100 %] 100 % (08/04 0620) Weight:  [81.3 kg] 81.3 kg (08/04 0620) 08/03 0701 - 08/04 0700 In: 120  [P.O.:120] Out: 1250 [Urine:1250]   General: Alert and cooperative and appears to be in no acute distress, on 2 L oxygen nasal cannula HEENT: Neck non-tender without lymphadenopathy, masses or thyromegaly Cardio: Normal S1 and S2, no S3 or S4. Rhythm is irregular. No murmurs or rubs.   Pulm: Clear to auscultation bilaterally, no crackles, wheezing, or diminished breath sounds. Normal respiratory effort Abdomen: Bowel sounds normal. Abdomen soft and non-tender.  Extremities: No peripheral edema. Warm/ well perfused.  Strong radial pulse. Neuro: Cranial nerves grossly intact  Laboratory: I have personally read and reviewed all labs and imaging studies.  CBC: Recent Labs  Lab 12/31/18 1515 01/01/19 0605 01/02/19 0325 01/04/19 0339 01/05/19 0331  WBC 7.1 3.2* 6.6 6.8 6.9  NEUTROABS 5.3 2.7  --   --   --   HGB 9.0* 10.0* 8.1* 8.9* 9.1*  HCT 28.3* 31.9* 25.7* 27.6* 27.5*  MCV 88.4 88.4 87.7 86.3 85.9  PLT 288 314 309 312 295   CMP: Recent Labs  Lab 01/01/19 0605  01/04/19 0339 01/05/19 0331 01/06/19 0509  NA 137   < > 135 135 136  K 4.1   < > 3.2* 3.8 4.2  CL 97*   < > 99 100 98  CO2 27   < > 29 24 30   GLUCOSE 183*   < > 123* 127* 123*  BUN 15   < > 23 22 20   CREATININE 0.77   < > 0.85 0.79 0.81  CALCIUM 8.9   < > 8.7* 8.6* 8.8*  MG  --    < > 1.9 2.1 2.1  ALBUMIN 2.8*  --   --   --   --    < > = values in this interval not displayed.     CBG: Recent Labs  Lab 01/04/19 2155 01/05/19 0646 01/05/19 1218 01/05/19 1620 01/05/19 2112  GLUCAP 118* 120* 148* 101* 125*    Imaging/Diagnostic Tests: No results found.  EKG Interpretation  Date/Time:  Wednesday December 31 2018 13:53:55 EDT Ventricular Rate:  100 PR Interval:    QRS Duration: 165 QT Interval:  386 QTC Calculation: 498 R Axis:   39 Text Interpretation:  Age not entered, assumed to be  85 years old for purpose of ECG interpretation Atrial fibrillation IVCD, consider atypical LBBB ST depression laterally  Confirmed by [name] [phone]) on 12/31/2018 2:34:39 PM Also confirmed by [name] [phone]), editor [name] [phone])  on 01/01/2019 6:53:59 AM      Procedures L thoracentesis and R thoracentesis   [name] Haw, MD 01/06/2019, 6:28 AM PGY-1, [place] Intern pager: [phone], text pages welcome

## 2019-01-06 NOTE — Progress Notes (Signed)
PT[facility] here at this time to transport patient to [facility].

## 2019-01-06 NOTE — TOC Transition Note (Signed)
Transition of Care [facility]) - CM/SW Discharge Note   Patient Details  Name: [name] MRN: [id number] Date of Birth: [birth year]  [facility] of [facility]) CM/SW Contact:  Vinie Sill, [place] Phone Number: 01/06/2019, 1:48 PM   Clinical Narrative:     Patient will DC to: [facility] [place] Date: 01/06/2019 Family Notified: [name], spouse  Transport YV:OPFY @ 3:30 pm   RN, patient, and facility notified of DC. Discharge Summary sent to facility. RN given number for report [phone], Room 307. Ambulance transport requested for patient.   Clinical Social Worker signing off. Thurmond Butts, MSW, [facility] Clinical Social Worker [phone]    Final next level of care: Skilled Nursing Facility Barriers to Discharge: Barriers Resolved   Patient Goals and CMS Choice Patient states their goals for this hospitalization and ongoing recovery are:: for patient to return to SNF for rehab CMS Medicare.gov Compare Post Acute Care list provided to:: (patient is from SNF) Choice offered to / list presented to : Spouse  Discharge Placement   Existing PASRR number confirmed : 01/05/19          Patient chooses bed at: [facility] Patient to be transferred to facility by: [place] Name of family member notified: [name], spouse Patient and family notified of of transfer: 01/06/19  Discharge Plan and Services In-house Referral: Clinical Social Work              DME Arranged: N/A DME Agency: NA       HH Arranged: NA HH Agency: NA        Social Determinants of Health (SDOH) Interventions     Readmission Risk Interventions No flowsheet data found.

## 2019-01-06 NOTE — Progress Notes (Signed)
Report called to Cristie Hem, LPN at [facility] at this time. PT[facility] scheduled to pick up patient around 1530.  K. Clement Husbands, RN

## 2019-01-06 NOTE — Plan of Care (Signed)
Continue to monitor

## 2019-01-14 ENCOUNTER — Other Ambulatory Visit: Payer: Self-pay | Admitting: Acute Care

## 2019-01-14 DIAGNOSIS — J90 Pleural effusion, not elsewhere classified: Secondary | ICD-10-CM

## 2019-01-14 NOTE — Telephone Encounter (Signed)
Pt is scheduled for an appt with TP 8/20. Nothing further needed.

## 2019-01-16 ENCOUNTER — Other Ambulatory Visit: Payer: Self-pay | Admitting: *Deleted

## 2019-01-16 DIAGNOSIS — I25119 Atherosclerotic heart disease of native coronary artery with unspecified angina pectoris: Secondary | ICD-10-CM | POA: Diagnosis not present

## 2019-01-16 DIAGNOSIS — J449 Chronic obstructive pulmonary disease, unspecified: Secondary | ICD-10-CM | POA: Diagnosis not present

## 2019-01-16 DIAGNOSIS — I2699 Other pulmonary embolism without acute cor pulmonale: Secondary | ICD-10-CM | POA: Diagnosis not present

## 2019-01-16 DIAGNOSIS — E46 Unspecified protein-calorie malnutrition: Secondary | ICD-10-CM | POA: Diagnosis not present

## 2019-01-16 DIAGNOSIS — M109 Gout, unspecified: Secondary | ICD-10-CM | POA: Diagnosis not present

## 2019-01-16 DIAGNOSIS — J918 Pleural effusion in other conditions classified elsewhere: Secondary | ICD-10-CM | POA: Diagnosis not present

## 2019-01-16 DIAGNOSIS — E1151 Type 2 diabetes mellitus with diabetic peripheral angiopathy without gangrene: Secondary | ICD-10-CM | POA: Diagnosis not present

## 2019-01-16 DIAGNOSIS — I5031 Acute diastolic (congestive) heart failure: Secondary | ICD-10-CM | POA: Diagnosis not present

## 2019-01-16 NOTE — Patient Outreach (Signed)
Member assessed for potential [facility] Care Management needs as a benefit of  [place] [employer].  Member is currently receiving rehab therapy at [facility].  Update received from [place] after her weekly telephonic IDT meeting with [facility] staff.  Facility reports member has low activity tolerance. He has also had medication adjustments to his blood pressure meds as well. He has 6 steps to enter home. Family has been encouraged to get a ramp. Disposition plan will be for home with wife with home health services.  Will continue to follow for disposition plans, progression, and for potential [facility] Care Management needs.   Will continue to collaborate with [facility] UM team and facility on member while at [facility].   Marthenia Rolling, MSN-Ed, RN,BSN [place] Acute Care Coordinator [phone] [facility]) [phone]  (Toll free office)

## 2019-01-18 DIAGNOSIS — I25119 Atherosclerotic heart disease of native coronary artery with unspecified angina pectoris: Secondary | ICD-10-CM | POA: Diagnosis not present

## 2019-01-18 DIAGNOSIS — J918 Pleural effusion in other conditions classified elsewhere: Secondary | ICD-10-CM | POA: Diagnosis not present

## 2019-01-18 DIAGNOSIS — E46 Unspecified protein-calorie malnutrition: Secondary | ICD-10-CM | POA: Diagnosis not present

## 2019-01-18 DIAGNOSIS — R0602 Shortness of breath: Secondary | ICD-10-CM | POA: Diagnosis not present

## 2019-01-19 ENCOUNTER — Other Ambulatory Visit: Payer: Self-pay

## 2019-01-19 ENCOUNTER — Other Ambulatory Visit (HOSPITAL_COMMUNITY): Payer: Self-pay | Admitting: Internal Medicine

## 2019-01-19 ENCOUNTER — Inpatient Hospital Stay (HOSPITAL_COMMUNITY)
Admission: EM | Admit: 2019-01-19 | Discharge: 2019-01-28 | DRG: 291 | Disposition: A | Discharge status: Skilled Nursing Facility | Payer: Medicare Other | Source: Skilled Nursing Facility | Attending: Family Medicine | Admitting: Family Medicine

## 2019-01-19 ENCOUNTER — Other Ambulatory Visit: Payer: Self-pay | Admitting: Internal Medicine

## 2019-01-19 ENCOUNTER — Encounter (HOSPITAL_COMMUNITY): Payer: Self-pay

## 2019-01-19 ENCOUNTER — Emergency Department (HOSPITAL_COMMUNITY): Payer: Medicare Other

## 2019-01-19 DIAGNOSIS — I252 Old myocardial infarction: Secondary | ICD-10-CM

## 2019-01-19 DIAGNOSIS — I272 Pulmonary hypertension, unspecified: Secondary | ICD-10-CM | POA: Diagnosis present

## 2019-01-19 DIAGNOSIS — Z7189 Other specified counseling: Secondary | ICD-10-CM

## 2019-01-19 DIAGNOSIS — J9621 Acute and chronic respiratory failure with hypoxia: Secondary | ICD-10-CM | POA: Diagnosis present

## 2019-01-19 DIAGNOSIS — J810 Acute pulmonary edema: Secondary | ICD-10-CM

## 2019-01-19 DIAGNOSIS — Z794 Long term (current) use of insulin: Secondary | ICD-10-CM

## 2019-01-19 DIAGNOSIS — Z8349 Family history of other endocrine, nutritional and metabolic diseases: Secondary | ICD-10-CM

## 2019-01-19 DIAGNOSIS — L89159 Pressure ulcer of sacral region, unspecified stage: Secondary | ICD-10-CM | POA: Diagnosis present

## 2019-01-19 DIAGNOSIS — Z7951 Long term (current) use of inhaled steroids: Secondary | ICD-10-CM

## 2019-01-19 DIAGNOSIS — Z86711 Personal history of pulmonary embolism: Secondary | ICD-10-CM

## 2019-01-19 DIAGNOSIS — M549 Dorsalgia, unspecified: Secondary | ICD-10-CM | POA: Diagnosis present

## 2019-01-19 DIAGNOSIS — Z20828 Contact with and (suspected) exposure to other viral communicable diseases: Secondary | ICD-10-CM | POA: Diagnosis present

## 2019-01-19 DIAGNOSIS — Z79899 Other long term (current) drug therapy: Secondary | ICD-10-CM

## 2019-01-19 DIAGNOSIS — Z515 Encounter for palliative care: Secondary | ICD-10-CM

## 2019-01-19 DIAGNOSIS — Z951 Presence of aortocoronary bypass graft: Secondary | ICD-10-CM

## 2019-01-19 DIAGNOSIS — Y95 Nosocomial condition: Secondary | ICD-10-CM | POA: Diagnosis present

## 2019-01-19 DIAGNOSIS — I5033 Acute on chronic diastolic (congestive) heart failure: Secondary | ICD-10-CM | POA: Diagnosis present

## 2019-01-19 DIAGNOSIS — R8281 Pyuria: Secondary | ICD-10-CM | POA: Diagnosis present

## 2019-01-19 DIAGNOSIS — I447 Left bundle-branch block, unspecified: Secondary | ICD-10-CM | POA: Diagnosis present

## 2019-01-19 DIAGNOSIS — I110 Hypertensive heart disease with heart failure: Secondary | ICD-10-CM | POA: Diagnosis not present

## 2019-01-19 DIAGNOSIS — I5030 Unspecified diastolic (congestive) heart failure: Secondary | ICD-10-CM | POA: Diagnosis present

## 2019-01-19 DIAGNOSIS — R064 Hyperventilation: Secondary | ICD-10-CM | POA: Diagnosis not present

## 2019-01-19 DIAGNOSIS — K5900 Constipation, unspecified: Secondary | ICD-10-CM | POA: Diagnosis present

## 2019-01-19 DIAGNOSIS — J440 Chronic obstructive pulmonary disease with acute lower respiratory infection: Secondary | ICD-10-CM | POA: Diagnosis present

## 2019-01-19 DIAGNOSIS — J9690 Respiratory failure, unspecified, unspecified whether with hypoxia or hypercapnia: Secondary | ICD-10-CM | POA: Diagnosis not present

## 2019-01-19 DIAGNOSIS — E1151 Type 2 diabetes mellitus with diabetic peripheral angiopathy without gangrene: Secondary | ICD-10-CM | POA: Diagnosis present

## 2019-01-19 DIAGNOSIS — R0602 Shortness of breath: Secondary | ICD-10-CM | POA: Diagnosis not present

## 2019-01-19 DIAGNOSIS — Z952 Presence of prosthetic heart valve: Secondary | ICD-10-CM

## 2019-01-19 DIAGNOSIS — I4821 Permanent atrial fibrillation: Secondary | ICD-10-CM | POA: Diagnosis not present

## 2019-01-19 DIAGNOSIS — R0902 Hypoxemia: Secondary | ICD-10-CM

## 2019-01-19 DIAGNOSIS — Z7901 Long term (current) use of anticoagulants: Secondary | ICD-10-CM

## 2019-01-19 DIAGNOSIS — J189 Pneumonia, unspecified organism: Secondary | ICD-10-CM | POA: Diagnosis not present

## 2019-01-19 DIAGNOSIS — I083 Combined rheumatic disorders of mitral, aortic and tricuspid valves: Secondary | ICD-10-CM | POA: Diagnosis present

## 2019-01-19 DIAGNOSIS — Z87891 Personal history of nicotine dependence: Secondary | ICD-10-CM

## 2019-01-19 DIAGNOSIS — R3129 Other microscopic hematuria: Secondary | ICD-10-CM | POA: Diagnosis present

## 2019-01-19 DIAGNOSIS — E785 Hyperlipidemia, unspecified: Secondary | ICD-10-CM | POA: Diagnosis present

## 2019-01-19 DIAGNOSIS — E876 Hypokalemia: Secondary | ICD-10-CM | POA: Diagnosis present

## 2019-01-19 DIAGNOSIS — J90 Pleural effusion, not elsewhere classified: Secondary | ICD-10-CM

## 2019-01-19 DIAGNOSIS — K219 Gastro-esophageal reflux disease without esophagitis: Secondary | ICD-10-CM | POA: Diagnosis present

## 2019-01-19 DIAGNOSIS — D649 Anemia, unspecified: Secondary | ICD-10-CM | POA: Diagnosis present

## 2019-01-19 DIAGNOSIS — I517 Cardiomegaly: Secondary | ICD-10-CM | POA: Insufficient documentation

## 2019-01-19 DIAGNOSIS — J449 Chronic obstructive pulmonary disease, unspecified: Secondary | ICD-10-CM | POA: Diagnosis present

## 2019-01-19 DIAGNOSIS — E8809 Other disorders of plasma-protein metabolism, not elsewhere classified: Secondary | ICD-10-CM | POA: Diagnosis present

## 2019-01-19 DIAGNOSIS — G8929 Other chronic pain: Secondary | ICD-10-CM | POA: Diagnosis present

## 2019-01-19 DIAGNOSIS — R0603 Acute respiratory distress: Secondary | ICD-10-CM

## 2019-01-19 DIAGNOSIS — Z9981 Dependence on supplemental oxygen: Secondary | ICD-10-CM

## 2019-01-19 DIAGNOSIS — R823 Hemoglobinuria: Secondary | ICD-10-CM | POA: Diagnosis present

## 2019-01-19 DIAGNOSIS — Z881 Allergy status to other antibiotic agents status: Secondary | ICD-10-CM

## 2019-01-19 DIAGNOSIS — Z8711 Personal history of peptic ulcer disease: Secondary | ICD-10-CM

## 2019-01-19 DIAGNOSIS — Z79891 Long term (current) use of opiate analgesic: Secondary | ICD-10-CM

## 2019-01-19 DIAGNOSIS — R0600 Dyspnea, unspecified: Secondary | ICD-10-CM | POA: Diagnosis present

## 2019-01-19 DIAGNOSIS — M109 Gout, unspecified: Secondary | ICD-10-CM | POA: Diagnosis present

## 2019-01-19 DIAGNOSIS — I2510 Atherosclerotic heart disease of native coronary artery without angina pectoris: Secondary | ICD-10-CM | POA: Diagnosis present

## 2019-01-19 DIAGNOSIS — N400 Enlarged prostate without lower urinary tract symptoms: Secondary | ICD-10-CM | POA: Diagnosis present

## 2019-01-19 DIAGNOSIS — G4733 Obstructive sleep apnea (adult) (pediatric): Secondary | ICD-10-CM | POA: Diagnosis present

## 2019-01-19 DIAGNOSIS — I493 Ventricular premature depolarization: Secondary | ICD-10-CM | POA: Diagnosis present

## 2019-01-19 DIAGNOSIS — J9811 Atelectasis: Secondary | ICD-10-CM | POA: Diagnosis present

## 2019-01-19 DIAGNOSIS — Z978 Presence of other specified devices: Secondary | ICD-10-CM

## 2019-01-19 DIAGNOSIS — F329 Major depressive disorder, single episode, unspecified: Secondary | ICD-10-CM | POA: Diagnosis present

## 2019-01-19 DIAGNOSIS — J9611 Chronic respiratory failure with hypoxia: Secondary | ICD-10-CM | POA: Diagnosis present

## 2019-01-19 DIAGNOSIS — I6529 Occlusion and stenosis of unspecified carotid artery: Secondary | ICD-10-CM | POA: Diagnosis present

## 2019-01-19 DIAGNOSIS — Z955 Presence of coronary angioplasty implant and graft: Secondary | ICD-10-CM

## 2019-01-19 DIAGNOSIS — Z8249 Family history of ischemic heart disease and other diseases of the circulatory system: Secondary | ICD-10-CM

## 2019-01-19 DIAGNOSIS — R8271 Bacteriuria: Secondary | ICD-10-CM | POA: Diagnosis present

## 2019-01-19 DIAGNOSIS — F419 Anxiety disorder, unspecified: Secondary | ICD-10-CM | POA: Diagnosis present

## 2019-01-19 HISTORY — DX: Other specified postprocedural states: Z98.890

## 2019-01-19 HISTORY — DX: Cardiomegaly: I51.7

## 2019-01-19 LAB — CBC WITH DIFFERENTIAL/PLATELET
Abs Immature Granulocytes: 0.03 10*3/uL (ref 0.00–0.07)
Basophils Absolute: 0.1 10*3/uL (ref 0.0–0.1)
Basophils Relative: 1 %
Eosinophils Absolute: 0.6 10*3/uL — ABNORMAL HIGH (ref 0.0–0.5)
Eosinophils Relative: 7 %
HCT: 32 % — ABNORMAL LOW (ref 39.0–52.0)
Hemoglobin: 9.8 g/dL — ABNORMAL LOW (ref 13.0–17.0)
Immature Granulocytes: 0 %
Lymphocytes Relative: 13 %
Lymphs Abs: 1.1 10*3/uL (ref 0.7–4.0)
MCH: 26.9 pg (ref 26.0–34.0)
MCHC: 30.6 g/dL (ref 30.0–36.0)
MCV: 87.9 fL (ref 80.0–100.0)
Monocytes Absolute: 0.8 10*3/uL (ref 0.1–1.0)
Monocytes Relative: 10 %
Neutro Abs: 5.5 10*3/uL (ref 1.7–7.7)
Neutrophils Relative %: 69 %
Platelets: 421 10*3/uL — ABNORMAL HIGH (ref 150–400)
RBC: 3.64 MIL/uL — ABNORMAL LOW (ref 4.22–5.81)
RDW: 15.9 % — ABNORMAL HIGH (ref 11.5–15.5)
WBC: 8 10*3/uL (ref 4.0–10.5)
nRBC: 0 % (ref 0.0–0.2)

## 2019-01-19 LAB — COMPREHENSIVE METABOLIC PANEL
ALT: 30 U/L (ref 0–44)
AST: 33 U/L (ref 15–41)
Albumin: 2.6 g/dL — ABNORMAL LOW (ref 3.5–5.0)
Alkaline Phosphatase: 165 U/L — ABNORMAL HIGH (ref 38–126)
Anion gap: 12 (ref 5–15)
BUN: 13 mg/dL (ref 8–23)
CO2: 31 mmol/L (ref 22–32)
Calcium: 8.8 mg/dL — ABNORMAL LOW (ref 8.9–10.3)
Chloride: 94 mmol/L — ABNORMAL LOW (ref 98–111)
Creatinine, Ser: 0.64 mg/dL (ref 0.61–1.24)
GFR calc Af Amer: >60 mL/min (ref >60)
GFR calc non Af Amer: >60 mL/min (ref >60)
Glucose, Bld: 134 mg/dL — ABNORMAL HIGH (ref 70–99)
Potassium: 4 mmol/L (ref 3.5–5.1)
Sodium: 137 mmol/L (ref 135–145)
Total Bilirubin: 0.7 mg/dL (ref 0.3–1.2)
Total Protein: 5.8 g/dL — ABNORMAL LOW (ref 6.5–8.1)

## 2019-01-19 LAB — URINALYSIS, ROUTINE W REFLEX MICROSCOPIC
Bilirubin Urine: NEGATIVE
Glucose, UA: NEGATIVE mg/dL
Ketones, ur: NEGATIVE mg/dL
Nitrite: POSITIVE — AB
Protein, ur: 100 mg/dL — AB
RBC / HPF: >50 RBC/hpf — ABNORMAL HIGH (ref 0–5)
Specific Gravity, Urine: 1.012 (ref 1.005–1.030)
pH: 9 — ABNORMAL HIGH (ref 5.0–8.0)

## 2019-01-19 LAB — PROCALCITONIN: Procalcitonin: <0.1 ng/mL

## 2019-01-19 LAB — BRAIN NATRIURETIC PEPTIDE: B Natriuretic Peptide: 222.4 pg/mL — ABNORMAL HIGH (ref 0.0–100.0)

## 2019-01-19 LAB — SARS CORONAVIRUS 2 BY RT PCR (HOSPITAL ORDER, PERFORMED IN ~~LOC~~ HOSPITAL LAB): SARS Coronavirus 2: NEGATIVE

## 2019-01-19 MED ORDER — SPIRONOLACTONE 25 MG PO TABS
25.0000 mg | ORAL_TABLET | Freq: Every day | ORAL | Status: DC
  Administered 2019-01-20 – 2019-01-28 (×9): 25 mg via ORAL
  Filled 2019-01-19 (×9): qty 1

## 2019-01-19 MED ORDER — FUROSEMIDE 10 MG/ML IJ SOLN
40.0000 mg | Freq: Once | INTRAMUSCULAR | Status: AC
  Administered 2019-01-19: 40 mg via INTRAVENOUS
  Filled 2019-01-19: qty 4

## 2019-01-19 MED ORDER — ACETAMINOPHEN 650 MG RE SUPP: 650.0000 mg | Freq: Four times a day (QID) | RECTAL | Status: DC | PRN

## 2019-01-19 MED ORDER — METOPROLOL TARTRATE 25 MG PO TABS
25.0000 mg | ORAL_TABLET | Freq: Two times a day (BID) | ORAL | Status: DC
  Administered 2019-01-19 – 2019-01-23 (×8): 25 mg via ORAL
  Filled 2019-01-19 (×8): qty 1

## 2019-01-19 MED ORDER — BRIMONIDINE TARTRATE 0.15 % OP SOLN
1.0000 [drp] | Freq: Two times a day (BID) | OPHTHALMIC | Status: DC
  Administered 2019-01-20 – 2019-01-28 (×17): 1 [drp] via OPHTHALMIC
  Filled 2019-01-19 (×3): qty 5

## 2019-01-19 MED ORDER — FOAM DRESSING PADS: 1.0000 | MEDICATED_PAD | Status: DC

## 2019-01-19 MED ORDER — PIPERACILLIN-TAZOBACTAM 3.375 G IVPB 30 MIN
3.3750 g | Freq: Once | INTRAVENOUS | Status: AC
  Administered 2019-01-19: 3.375 g via INTRAVENOUS
  Filled 2019-01-19: qty 50

## 2019-01-19 MED ORDER — MELATONIN 5 MG PO TABS: 5.0000 mg | ORAL_TABLET | Freq: Every day | ORAL | Status: DC

## 2019-01-19 MED ORDER — POTASSIUM CHLORIDE CRYS ER 20 MEQ PO TBCR
40.0000 meq | EXTENDED_RELEASE_TABLET | Freq: Three times a day (TID) | ORAL | Status: AC
  Administered 2019-01-19: 40 meq via ORAL
  Filled 2019-01-19: qty 2

## 2019-01-19 MED ORDER — MELATONIN 3 MG PO TABS
6.0000 mg | ORAL_TABLET | Freq: Every day | ORAL | Status: DC
  Administered 2019-01-20 – 2019-01-27 (×9): 6 mg via ORAL
  Filled 2019-01-19 (×10): qty 2

## 2019-01-19 MED ORDER — FERROUS SULFATE 325 (65 FE) MG PO TABS
325.0000 mg | ORAL_TABLET | Freq: Every day | ORAL | Status: DC
  Administered 2019-01-20 – 2019-01-28 (×9): 325 mg via ORAL
  Filled 2019-01-19 (×9): qty 1

## 2019-01-19 MED ORDER — ACETAMINOPHEN 325 MG PO TABS
650.0000 mg | ORAL_TABLET | Freq: Four times a day (QID) | ORAL | Status: DC | PRN
  Administered 2019-01-20 – 2019-01-24 (×2): 650 mg via ORAL
  Filled 2019-01-19 (×3): qty 2

## 2019-01-19 MED ORDER — TAMSULOSIN HCL 0.4 MG PO CAPS
0.4000 mg | ORAL_CAPSULE | Freq: Every evening | ORAL | Status: DC
  Administered 2019-01-20 – 2019-01-27 (×8): 0.4 mg via ORAL
  Filled 2019-01-19 (×8): qty 1

## 2019-01-19 MED ORDER — LEVALBUTEROL HCL 0.63 MG/3ML IN NEBU
0.6300 mg | INHALATION_SOLUTION | Freq: Four times a day (QID) | RESPIRATORY_TRACT | Status: DC | PRN
  Administered 2019-01-20 – 2019-01-22 (×5): 0.63 mg via RESPIRATORY_TRACT
  Filled 2019-01-19 (×5): qty 3

## 2019-01-19 MED ORDER — APIXABAN 5 MG PO TABS
5.0000 mg | ORAL_TABLET | Freq: Two times a day (BID) | ORAL | Status: DC
  Administered 2019-01-19 – 2019-01-28 (×18): 5 mg via ORAL
  Filled 2019-01-19 (×16): qty 1
  Filled 2019-01-19: qty 2
  Filled 2019-01-19: qty 1

## 2019-01-19 MED ORDER — PANTOPRAZOLE SODIUM 40 MG PO TBEC
40.0000 mg | DELAYED_RELEASE_TABLET | Freq: Every day | ORAL | Status: DC
  Administered 2019-01-20 – 2019-01-28 (×9): 40 mg via ORAL
  Filled 2019-01-19 (×9): qty 1

## 2019-01-19 MED ORDER — ALLOPURINOL 300 MG PO TABS
300.0000 mg | ORAL_TABLET | Freq: Every day | ORAL | Status: DC
  Administered 2019-01-20 – 2019-01-28 (×9): 300 mg via ORAL
  Filled 2019-01-19 (×9): qty 1

## 2019-01-19 MED ORDER — ADULT MULTIVITAMIN W/MINERALS CH
1.0000 | ORAL_TABLET | Freq: Every day | ORAL | Status: DC
  Administered 2019-01-20 – 2019-01-28 (×9): 1 via ORAL
  Filled 2019-01-19 (×9): qty 1

## 2019-01-19 MED ORDER — FUROSEMIDE 10 MG/ML IJ SOLN
40.0000 mg | Freq: Four times a day (QID) | INTRAMUSCULAR | Status: DC
  Administered 2019-01-19 – 2019-01-20 (×2): 40 mg via INTRAVENOUS
  Filled 2019-01-19 (×2): qty 4

## 2019-01-19 MED ORDER — UMECLIDINIUM BROMIDE 62.5 MCG/INH IN AEPB
1.0000 | INHALATION_SPRAY | Freq: Every day | RESPIRATORY_TRACT | Status: DC
  Administered 2019-01-20 – 2019-01-28 (×9): 1 via RESPIRATORY_TRACT
  Filled 2019-01-19 (×2): qty 7

## 2019-01-19 MED ORDER — VANCOMYCIN HCL IN DEXTROSE 1-5 GM/200ML-% IV SOLN
1000.0000 mg | Freq: Once | INTRAVENOUS | Status: DC
  Administered 2019-01-19: 1000 mg via INTRAVENOUS
  Filled 2019-01-19: qty 200

## 2019-01-19 MED ORDER — ATORVASTATIN CALCIUM 80 MG PO TABS
80.0000 mg | ORAL_TABLET | Freq: Every day | ORAL | Status: DC
  Administered 2019-01-19 – 2019-01-27 (×9): 80 mg via ORAL
  Filled 2019-01-19 (×9): qty 1

## 2019-01-19 NOTE — ED Notes (Signed)
Wife:  [name]  [phone], Patient's wife.  Please call with an update, when plans are made.  She has just been updated at this point.  She reports he has had increased sputum today, she also reports that Dr Tamala Julian with pulmonology drained both of his lungs 2 weeks ago and he is scheduled to have appt on Thursday and the appt to have them drained this Friday.

## 2019-01-19 NOTE — ED Provider Notes (Signed)
[facility] EMERGENCY DEPARTMENT Provider Note   CSN: [id number] Arrival date & time: 01/19/19  1355     History   Chief Complaint Chief Complaint  Patient presents with  . Shortness of Breath    HPI [name] is a 85 y.o. male.     HPI Presents from nursing facility with concern of dyspnea. Notably, patient has minimal symptoms at rest, with any exertion he is dyspneic. Patient has multiple medical issues including CAD, A. fib, recurrent pleural effusions. He was discharged from this facility only 2 weeks ago after hospitalization for fluid overload status. Per nursing report the patient is hypoxic with motion, but not at rest.  This is similarly demonstrated by EMS vital sign monitoring in route. Patient self denies pain, confusion, disorientation, fever. No reported change from baseline oxygen requirement of 3 L via nasal cannula 24/7. Reportedly, efforts were underway for the patient have thoracentesis later this week, but without capacity to do so earlier he was sent here for evaluation. Past Medical History:  Diagnosis Date  . Acute myocardial infarction, unspecified site, initial episode of care   . Anemia   . Anxiety   . Aortic stenosis    . Atrial fibrillation ([place])    a. permanent, on Coumadin for anticoagulation  . CAD (coronary artery disease)    a. s/p CABG in 1994 b. cath in 2015 showing normal LM, 100% LCx and RCA stenosis with 50-60% prox LAD stenosis and 100% mid-LAD stenosis; patent sequential SVG-OM2-OM3 and patent LIMA-LAD with PTCA of distal LAD via LIMA graft performed at that time  . Carotid stenosis   . CHF (congestive heart failure) ([place])   . COPD (chronic obstructive pulmonary disease) ([place])   . Depression   . Diabetes mellitus without complication ([place])    FASTING 98-120S  . GERD (gastroesophageal reflux disease)   . History of blood transfusion   . History of peptic ulcer disease   . Hyperlipidemia   . Hypertension    . Myocardial infarction ([place])    X2  . Peripheral vascular disease ([place])   . Pneumonia    HX OF  . Renal artery stenosis ([place])   . Sleep apnea    OXYGEN AT NIGHT 2L [place]    Patient Active Problem List   Diagnosis Date Noted  . S/P thoracentesis   . Dyspnea 12/31/2018  . CHF (congestive heart failure) ([place]) 12/17/2018  . Pressure injury of skin 12/17/2018  . Hypoxemia   . Pulmonary embolism and infarction ([place])   . Acute on chronic respiratory failure with hypoxia ([place])   . SOB (shortness of breath)   . Cellulitis 11/15/2018  . 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 Severe aortic stenosis 09/17/2017  . Mitral stenosis   . Aftercare following surgery of the circulatory system 03/15/2014  . Carotid stenosis 03/15/2014  . Unstable angina ([place]) 08/31/2013  . Acute myocardial infarction, unspecified site, initial episode of care   . Aortic stenosis   . Atrial fibrillation ([place])   . Vertigo 06/27/2013  . Chest pain with moderate risk of acute coronary syndrome 06/27/2013  . CAD - CABG '94, low risk Myoview May 2014 06/27/2013  . Chronic atrial fibrillation 06/27/2013  . Chronic anticoagulation 06/27/2013  . Gout attack- on steroid dose pack 06/27/2013  . Tachycardia- beta blocker increased 06/26/2013  . Essential hypertension, benign 03/10/2009  . HYPERLIPIDEMIA 09/09/2008  . UNSPECIFIED ANEMIA 09/09/2008  . CAROTID STENOSIS- moderate 09/09/2008  . UNSPECIFIED  CEREBROVASCULAR DISEASE 09/09/2008  . RENAL ARTERY STENOSIS- moderate 09/09/2008  . PERIPHERAL VASCULAR DISEASE 09/09/2008  . PEPTIC ULCER DISEASE, HX OF 09/09/2008    Past Surgical History:  Procedure Laterality Date  . AORTIC ARCH ANGIOGRAPHY N/A 08/14/2017   Procedure: AORTIC ARCH ANGIOGRAPHY;  Surgeon: Sherren Mocha, MD;  Location: [facility];  Service: Cardiovascular;  Laterality: N/A;  . CARDIAC CATHETERIZATION    . CAROTID ENDARTERECTOMY    . COLON SURGERY    . CORONARY  ARTERY BYPASS GRAFT  1994  . ENDARTERECTOMY Right 01/05/2013   Procedure: ENDARTERECTOMY CAROTID;  Surgeon: Rosetta Posner, MD;  Location: [facility];  Service: Vascular;  Laterality: Right;  . INCISION AND DRAINAGE Left 11/16/2018   Procedure: INCISION AND DRAINAGE LEFT FOREARM/ARM;  Surgeon: Leanora Cover, MD;  Location: [facility];  Service: Orthopedics;  Laterality: Left;  . KNEE SURGERY  08/2003   left knee/ARTHROSCOPIC  . LEFT HEART CATHETERIZATION WITH CORONARY/GRAFT ANGIOGRAM N/A 09/01/2013   Procedure: LEFT HEART CATHETERIZATION WITH Beatrix Fetters;  Surgeon: Sinclair Grooms, MD;  Location: [facility] CATH LAB;  Service: Cardiovascular;  Laterality: N/A;  . PATCH ANGIOPLASTY Right 01/05/2013   Procedure: PATCH ANGIOPLASTY;  Surgeon: Rosetta Posner, MD;  Location: [facility];  Service: Vascular;  Laterality: Right;  . PERCUTANEOUS CORONARY STENT INTERVENTION (PCI-S)  09/01/2013   Procedure: PERCUTANEOUS CORONARY STENT INTERVENTION (PCI-S);  Surgeon: Sinclair Grooms, MD;  Location: [facility] CATH LAB;  Service: Cardiovascular;;  . removal of bleeding ulcer  1994  . RENAL ARTERY STENT     stenting of the left renal artery as well as a cutting balloon angioplasty for treatment of in-stent restenosis coronary artery bypass grafting in 1994.  [name] RIGHT/LEFT HEART CATH AND CORONARY/GRAFT ANGIOGRAPHY N/A 08/14/2017   Procedure: RIGHT/LEFT HEART CATH AND CORONARY/GRAFT ANGIOGRAPHY;  Surgeon: Sherren Mocha, MD;  Location: [facility];  Service: Cardiovascular;  Laterality: N/A;  . TEE WITHOUT CARDIOVERSION N/A 09/17/2017   Procedure: TRANSESOPHAGEAL ECHOCARDIOGRAM (TEE);  Surgeon: Sherren Mocha, MD;  Location: [facility];  Service: Open Heart Surgery;  Laterality: N/A;        Home Medications    Prior to Admission medications   Medication Sig Start Date End Date Taking? Authorizing Provider  allopurinol (ZYLOPRIM) 300 MG tablet Take 300 mg by mouth daily. 10/12/18   [provider]  Amino Acids-Protein  Hydrolys (PRO-STAT) LIQD Take 30 mLs by mouth 2 (two) times a day.    [provider]  apixaban (ELIQUIS) 5 MG TABS tablet Take 1 tablet (5 mg total) by mouth 2 (two) times daily. 12/22/18 01/21/19  Lurline Del, MD  atorvastatin (LIPITOR) 80 MG tablet Take 1 tablet (80 mg total) by mouth at bedtime. 12/07/14   Lelon Perla, MD  benazepril (LOTENSIN) 5 MG tablet Take 1 tablet (5 mg total) by mouth daily. 12/23/18   Lurline Del, MD  bisacodyl (DULCOLAX) 5 MG EC tablet Take 1 tablet (5 mg total) by mouth daily. 12/23/18   Lurline Del, MD  brimonidine (ALPHAGAN) 0.2 % ophthalmic solution Place 1 drop into both eyes 2 (two) times daily.    [provider]  budesonide-formoterol (SYMBICORT) 80-4.5 MCG/ACT inhaler Inhale 1 puff into the lungs as needed for shortness of breath. 09/05/18   [provider]  ferrous sulfate 325 (65 FE) MG tablet Take 325 mg by mouth daily with breakfast.     [provider]  furosemide (LASIX) 80 MG tablet Take 1 tablet (80 mg  total) by mouth daily. 01/06/19   Wilber Oliphant, MD  Gauze Pads & Dressings (FOAM DRESSING) PADS Apply 1 patch topically See admin instructions. Apply a foam dressing to sacrum every day AND as needed for redness    [provider]  HYDROcodone-acetaminophen (NORCO/VICODIN) 5-325 MG tablet Take 1-2 tablets by mouth every 4 (four) hours as needed for moderate pain or severe pain. 01/06/19   Lattie Haw, MD  insulin aspart (NOVOLOG FLEXPEN) 100 UNIT/ML FlexPen Inject 2 Units into the skin See admin instructions. Inject 2 units into the skin at 9 AM in the morning for a BGL >200    [provider]  lansoprazole (PREVACID) 30 MG capsule Take 30 mg by mouth daily before breakfast.     [provider]  levalbuterol (XOPENEX) 0.63 MG/3ML nebulizer solution Take 3 mLs (0.63 mg total) by nebulization every 6 (six) hours as needed for wheezing or shortness of breath. Patient taking differently: Take 0.63  mg by nebulization 3 (three) times daily.  12/22/18   Lurline Del, MD  metFORMIN (GLUCOPHAGE-XR) 500 MG 24 hr tablet Take 500 mg by mouth daily. 12/10/18   [provider]  metoprolol tartrate (LOPRESSOR) 25 MG tablet Take 1 tablet (25 mg total) by mouth 2 (two) times daily. 01/06/19   Lattie Haw, MD  Multiple Vitamin (MULTIVITAMIN WITH MINERALS) TABS Take 1 tablet by mouth daily.    [provider]  NON FORMULARY Take 240 mLs by mouth See admin instructions. MedPass: Drink 240 ml's by mouth two times a day    [provider]  OXYGEN Inhale 3-4 L/min into the lungs continuous. 3 liters/min continuously TO MAINTAIN SATS <90% and may increase to 4 liters/min as needed with dyspnea upon exertion or shortness of breath    [provider]  polyethylene glycol (MIRALAX / GLYCOLAX) 17 g packet Take 17 g by mouth 2 (two) times daily. Patient taking differently: Take 17 g by mouth 2 (two) times daily. MIXED IN LIQUID DRINK 12/22/18   Lurline Del, MD  tamsulosin (FLOMAX) 0.4 MG CAPS capsule Take 1 capsule (0.4 mg total) by mouth daily after supper. Patient taking differently: Take 0.4 mg by mouth every evening.  12/22/18   Lurline Del, MD  trolamine salicylate (ASPERCREME) 10 % cream Apply 1 application topically every 6 (six) hours as needed for muscle pain.     [provider]  umeclidinium bromide (INCRUSE ELLIPTA) 62.5 MCG/INH AEPB Inhale 1 puff into the lungs daily. Patient taking differently: Inhale 1 puff into the lungs at bedtime.  11/27/18   Matilde Haymaker, MD    Family History Family History  Problem Relation Age of Onset  . Coronary artery disease Mother   . Heart disease Mother        Before age 3  . Hyperlipidemia Mother   . Hypertension Mother   . Heart attack Mother   . Coronary artery disease Father   . Heart disease Father        After age 26  . Heart attack Father   . Heart disease Sister        After age 51  . Heart disease Brother         After age 31  . Coronary artery disease Other        13 sibling, almost all have coronary disease and some with premature onset  . Heart disease Other        13 sibling, almost all have coronary disease and  some with premature onset    Social History Social History   Tobacco Use  . Smoking status: Former Smoker    Quit date: 04/27/1993    Years since quitting: 25.7  . Smokeless tobacco: Never Used  Substance Use Topics  . Alcohol use: No    Alcohol/week: 0.0 standard drinks  . Drug use: No     Allergies   Bactrim [sulfamethoxazole-trimethoprim] and Levaquin [levofloxacin in d5w]   Review of Systems Review of Systems  Constitutional:       Per HPI, otherwise negative  HENT:       Per HPI, otherwise negative  Respiratory:       Per HPI, otherwise negative  Cardiovascular:       Per HPI, otherwise negative  Gastrointestinal: Negative for vomiting.  Endocrine:       Negative aside from HPI  Genitourinary:       Neg aside from HPI   Musculoskeletal:       Per HPI, otherwise negative  Skin: Negative.   Neurological: Positive for weakness. Negative for syncope.     Physical Exam Updated Vital Signs Pulse (!) 107   Temp 98.1 F (36.7 C) (Oral)   Ht 6' (1.829 m)   Wt 81.3 kg   BMI 24.31 kg/m   Physical Exam Vitals signs and nursing note reviewed.  Constitutional:      Appearance: He is ill-appearing.  HENT:     Head: Normocephalic and atraumatic.  Eyes:     Conjunctiva/sclera: Conjunctivae normal.  Cardiovascular:     Rate and Rhythm: Regular rhythm. Tachycardia present.  Pulmonary:     Effort: Tachypnea present.     Breath sounds: Decreased breath sounds present.  Abdominal:     General: There is no distension.     Palpations: Abdomen is soft.     Tenderness: There is no abdominal tenderness. There is no guarding.  Genitourinary:    Comments: Foley catheter in place Skin:    General: Skin is warm and dry.  Neurological:     Mental Status:  He is alert and oriented to person, place, and time.      ED Treatments / Results  Labs (all labs ordered are listed, but only abnormal results are displayed) Labs Reviewed  CBC WITH DIFFERENTIAL/PLATELET - Abnormal; Notable for the following components:      Result Value   RBC 3.64 (*)    Hemoglobin 9.8 (*)    HCT 32.0 (*)    RDW 15.9 (*)    Platelets 421 (*)    Eosinophils Absolute 0.6 (*)    All other components within normal limits  SARS CORONAVIRUS 2 (HOSPITAL ORDER, [facility])  COMPREHENSIVE METABOLIC PANEL  URINALYSIS, ROUTINE W REFLEX MICROSCOPIC  BRAIN NATRIURETIC PEPTIDE    EKG Heart rate 111, A. fib, nonspecific ST-T wave changes, substantial artifact, abnormal   Radiology Dg Chest Port 1 View  Result Date: 01/19/2019 CLINICAL DATA:  85 year old with shortness of breath EXAM: PORTABLE CHEST 1 VIEW COMPARISON:  01/02/2019 and prior radiographs FINDINGS: Cardiomegaly, pulmonary vascular congestion and mild interstitial opacities noted. Small pleural effusions are present. LEFT LOWER lung atelectasis/airspace disease identified. There is no evidence of pneumothorax. IMPRESSION: Cardiomegaly with pulmonary vascular congestion, small bilateral pleural effusions and possible mild interstitial edema. LEFT LOWER lung atelectasis/airspace disease. Electronically Signed   By: Margarette Canada M.D.   On: 01/19/2019 14:58    Procedures Procedures (including critical care time)  Medications Ordered in ED  Medications  furosemide (LASIX) injection 40 mg (has no administration in time range)  vancomycin (VANCOCIN) IVPB 1000 mg/200 mL premix (has no administration in time range)  piperacillin-tazobactam (ZOSYN) IVPB 3.375 g (has no administration in time range)     Initial Impression / Assessment and Plan / ED Course  I have reviewed the triage vital signs and the nursing notes.  Pertinent labs & imaging results that were available during my care of  the patient were reviewed by me and considered in my medical decision making (see chart for details).    Chart review, including discharge summary from 13 days ago performed. Patient admitted for recurrent dyspnea.  During that hospitalization the patient had both IV diuretics and thoracentesis performed.    Initial labs generally reassuring, consistent with prior. However x-ray suggests atypical classification, without substantial reaccumulation of pleural effusions concerning for possible pneumonia.  This elderly male with a history of recent admission for respiratory concerns presents with worsening respiratory status, but is on his typical level of nasal cannula.  I patient found to have possible pneumonia versus fluid overloaded status. No indication for emergent thoracentesis, but given his worsening condition, he is started on antibiotics, and after discussion with his primary care team from his recent admission he was admitted for further monitoring, management.  Final Clinical Impressions(s) / ED Diagnoses   Final diagnoses:  HCAP (healthcare-associated pneumonia)     Carmin Muskrat, MD 01/19/19 1531

## 2019-01-19 NOTE — H&P (Addendum)
[facility] Admission History and Physical Service Pager: [phone]  Patient name: [name] Medical record number: [id number] Date of birth: 11-08-1931 Age: 85 y.o. Gender: male  Primary Care Provider: Nicoletta Dress, MD Consultants:  CCM Code Status: Full Preferred Emergency Contact: Wife, [name] [phone]  Chief Complaint: Shortness of breath  Assessment and Plan: [name] is a 85 y.o. male presenting with shortness of breath. PMH is significant for CHF, atrial fibrillation, gout, type 2 diabetes, BPH, CAD, history of provoked PE.  SOB 2/2 B/L Pulmonary Effusions  Pt is complaining of dyspnea and tachypnea that started a couple days ago and continues to worsen even after breathing treatments. Patient recently admitted multiple times with dyspnea. Most reently, on 12/31/18 where patient required bipap and ultimately b/l thoracentesis for relief of dyspnea. 1.2 L was drained during that admission from b/l lung fields; however, no labs were obtained on drained pleural fluid. He reported feeling much improved after those procedures. He was scheduled for o/p thoracentesis with [facility] on 01/23/19. At baseline, patient on 3L nasal cannula in the setting of HF and COPD. On exam, patient expressed dyspnea on 3 L despite satting 100%, however, was tachypneic. He reported relief with increase to 4L. On admission, labs s/f BNP is 222.4 with prior values >300 at last two admissions. On exam, dimished breath sounds at lung bases, no crackles appreciated. On CXR, pulmonary vascular congestion present w/  Lower lung atelectasis and bilateral pulmonary effusions.  Differential for presenation includes: COPD exacerbation- less likely given no increased cough, sputum production, or wheezing on exam. CHF less likely given BNP is mildly improved from previous admissions, unlikely to be pna given no wbc, procalcitonin <10, no fever or findings on CXR. In ED, obtained  Vancomycin for empiric treatment, which was discontinued due to low pretest probability. Unlikely to be PE given daily eliquis as documented by [facility] at [place]. The most likely cause of dyspnea is pleural effusions that are present on imaging as patient reports inability to take deep breaths likely due to decreased pleural space. Consult CCM for thoracentesis while patient was in ED. CCM attending opted to not perform thoracenteses and diurese with IV lasix 40 x 3 overnight. Cause of pleural effusions has not been identified. Previous taps were not sent to fluid analysis. Will continue to monitor with IV lasix diuresis (can consider increasing spironolactone if poor output) and assess later on for need for thoracentesis. The cause of pleural effusion could be due to CHF, pulmonary HTN, a fib w RVR, hypoalbuminemia, atelectasis, pna,  or malignancy. Will send pleural fluid samples if tapped again during this admission.  - admit to FMTS, telemetry, Admitting Attending: Dr. Andria Frames - strict BR -PT /OT with improved dyspnea  -CCM consulted, recs for IV diuresis and re-evaluation  -Start Lasix 40 mg for 1 dose followed by every 6 hours for 3 doses - Monitor Cr with diuresis  - plan for at least diagnostic tap prior to discharge   Acute on Chronic HFpEF Most recent echo obtained on 01/01/19 which showed EF 50-55% with normal LV cavity size, mildly increased LV thickness. Left atrial dilation, s/p TAVR bioprosthetic with moderate TV regurgitation, moderate-severe MV stenosis, RV with mildly reduced systolic function, RA mildly dilated with RAP 70mmHg. Will also obtain troponins given patient's reports of chest tightness in the ED  -Cardiac monitoring -Spironolactone 25 mg daily -Hold home PO lasix of 40 mg - V lasix 40 x 3 as above  -  continue home metoprolol titrate 25 mg BID - hold spironolactone 25mg  given IV lasix load - f/u troponins  COPD  SOB likely not due to COPD exacerbation given no increased  cough, sputum production or wheezing on exam. Will continue normal home medications -Symbicort 80-4 0.5 MCG -Levalbuterol 0.3 mg per 3 mL every 6 PRN -Incruse Ellipta 82.5 mcg 1 puff daily; will hold  Persistent atrial fibrillation, on eliquis Chronic and rate controlled. He is tachycardic.  EKG showed heart rate is 111 A. fib with nonspecific ST T wave changes. -Continue Eliquis 5 mg twice daily -Continue metoprolol tartrate 25mg  twice daily -Telemetry  IDDM, well controlled Blood glucose today is 134.  A1c 6.1 7/15 2020. - holding home AM sliding scale  - hold home metformin  - goal CBG 140-180.  - page MD with CBG > 180 for insulin instruction  - AM BMP   GERD - substitute formulary Protonix 40 mg daily  BPH Reports no urinary symptoms as he has an indwelling catheter currently. -Continue tamsulosin 0.4 mg daily - will need f/u at urology for cath (recommended at last discharge) - check to see if patient went to appt.   Asymptomatic Bacteruria  Hemoglobinuria Patient with positive nitrites, bacteria, leuks on initial UA. WBC clumps, mucus, hyaline casts, amorphous crystal and triple phosphate crystal present as well. PH 9. > 50 RBC  CAD  HLD  Last lipid panel in 2014. No repeat at this time - Continue atorvastatin 80 mg at bedtime  Hypertension Normotensive on admission  - hold benazepril 5 mg daily   Constipation  hold home Dulcolax 5 mg daily & Polyethylene glycol 17 g twice daily given complaints of diarrhea recently  - re dose if no BM x 1 day   Chronic anemia Hemoglobin on admission is 9.8 (at baseline), MCV 87.9. On home iron supplementation daily; however, no iron panel in chart.  - ferritin with AM labs  - continue Ferrous sulfate 325 mg for now  Chronic back pain At home, on Hydrocodone acetaminophen 5 325 mg every 4 PRN; will hold in setting of dyspnea - please notify MD for pain  - tylenol PRN   Gout - Continue allopurinol 300mg  daily  Pressure  Ulcers, sacrum  - daily wound care  - foam dressing to sacrum daily  - q2 hour bed shifting and readjustment   Goals of care Previously seen by palliative care and wanted to continue with full code and continued aggressive treatment. Can consider palliative reconsult. At last discharge, recommendation for further follow up regarding foals of care given 12 month mortality risk.   FEN/GI: Modified carb diet, Protonix 40 mg Prophylaxis: Not necessary patient already on Eliquis  Disposition: Return to [facility] pending improved dyspnea  History of Present Illness:  [name] is a 85 y.o. male presenting with dyspnea that started a few days ago and have been worsening. He has been trying to do deep breaths to help. Reports lightheadedness and chest tightness currently in the ED. n three liters at baseline.   Review Of Systems: Per HPI with the following additions:   Review of Systems  Constitutional: Negative for chills and fever.  HENT: Negative for congestion and sore throat.   Eyes: Negative for blurred vision and double vision.  Respiratory: Positive for cough, sputum production and shortness of breath.   Cardiovascular: Positive for chest pain.  Gastrointestinal: Negative for constipation, diarrhea, nausea and vomiting.  Genitourinary: Negative for dysuria, frequency and urgency.  Musculoskeletal: Positive  for back pain. Negative for falls and myalgias.       Pain in heels  Neurological: Positive for dizziness, weakness and headaches.  Psychiatric/Behavioral: Negative for suicidal ideas. The patient is not nervous/anxious.     Patient Active Problem List   Diagnosis Date Noted  . S/P thoracentesis   . Dyspnea 12/31/2018  . CHF (congestive heart failure) ([place]) 12/17/2018  . Pressure injury of skin 12/17/2018  . Hypoxemia   . Pulmonary embolism and infarction ([place])   . Acute on chronic respiratory failure with hypoxia ([place])   . SOB (shortness of breath)   . Cellulitis  11/15/2018  . 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 Severe aortic stenosis 09/17/2017  . Mitral stenosis   . Aftercare following surgery of the circulatory system 03/15/2014  . Carotid stenosis 03/15/2014  . Unstable angina ([place]) 08/31/2013  . Acute myocardial infarction, unspecified site, initial episode of care   . Aortic stenosis   . Atrial fibrillation ([place])   . Vertigo 06/27/2013  . Chest pain with moderate risk of acute coronary syndrome 06/27/2013  . CAD - CABG '94, low risk Myoview May 2014 06/27/2013  . Chronic atrial fibrillation 06/27/2013  . Chronic anticoagulation 06/27/2013  . Gout attack- on steroid dose pack 06/27/2013  . Tachycardia- beta blocker increased 06/26/2013  . Essential hypertension, benign 03/10/2009  . HYPERLIPIDEMIA 09/09/2008  . UNSPECIFIED ANEMIA 09/09/2008  . CAROTID STENOSIS- moderate 09/09/2008  . UNSPECIFIED CEREBROVASCULAR DISEASE 09/09/2008  . RENAL ARTERY STENOSIS- moderate 09/09/2008  . PERIPHERAL VASCULAR DISEASE 09/09/2008  . PEPTIC ULCER DISEASE, HX OF 09/09/2008    Past Medical History: Past Medical History:  Diagnosis Date  . Acute myocardial infarction, unspecified site, initial episode of care   . Anemia   . Anxiety   . Aortic stenosis    . Atrial fibrillation ([place])    a. permanent, on Coumadin for anticoagulation  . CAD (coronary artery disease)    a. s/p CABG in 1994 b. cath in 2015 showing normal LM, 100% LCx and RCA stenosis with 50-60% prox LAD stenosis and 100% mid-LAD stenosis; patent sequential SVG-OM2-OM3 and patent LIMA-LAD with PTCA of distal LAD via LIMA graft performed at that time  . Carotid stenosis   . CHF (congestive heart failure) ([place])   . COPD (chronic obstructive pulmonary disease) ([place])   . Depression   . Diabetes mellitus without complication ([place])    FASTING 98-120S  . GERD (gastroesophageal reflux disease)   . History of blood transfusion   . History of peptic ulcer disease    . Hyperlipidemia   . Hypertension   . Myocardial infarction ([place])    X2  . Peripheral vascular disease ([place])   . Pneumonia    HX OF  . Renal artery stenosis ([place])   . Sleep apnea    OXYGEN AT NIGHT 2L [place]    Past Surgical History: Past Surgical History:  Procedure Laterality Date  . AORTIC ARCH ANGIOGRAPHY N/A 08/14/2017   Procedure: AORTIC ARCH ANGIOGRAPHY;  Surgeon: Sherren Mocha, MD;  Location: [place] [facility];  Service: Cardiovascular;  Laterality: N/A;  . CARDIAC CATHETERIZATION    . CAROTID ENDARTERECTOMY    . COLON SURGERY    . CORONARY ARTERY BYPASS GRAFT  1994  . ENDARTERECTOMY Right 01/05/2013   Procedure: ENDARTERECTOMY CAROTID;  Surgeon: Rosetta Posner, MD;  Location: [place];  Service: Vascular;  Laterality: Right;  . INCISION AND DRAINAGE Left 11/16/2018   Procedure: INCISION AND DRAINAGE  LEFT FOREARM/ARM;  Surgeon: Leanora Cover, MD;  Location: [place];  Service: Orthopedics;  Laterality: Left;  . KNEE SURGERY  08/2003   left knee/ARTHROSCOPIC  . LEFT HEART CATHETERIZATION WITH CORONARY/GRAFT ANGIOGRAM N/A 09/01/2013   Procedure: LEFT HEART CATHETERIZATION WITH Beatrix Fetters;  Surgeon: Sinclair Grooms, MD;  [facility] C[facility];  Service: Cardiovascular;  Laterality: N/A;  . PATCH ANGIOPLASTY Right 01/05/2013   Procedure: PATCH ANGIOPLASTY;  Surgeon: Rosetta Posner, MD;  Location: [place];  Service: Vascular;  Laterality: Right;  . PERCUTANEOUS CORONARY STENT INTERVENTION (PCI-S)  09/01/2013   Procedure: PERCUTANEOUS CORONARY STENT INTERVENTION (PCI-S);  Surgeon: Sinclair Grooms, MD;  Location: [facility] CATH LAB;  Service: Cardiovascular;;  . removal of bleeding ulcer  1994  . RENAL ARTERY STENT     stenting of the left renal artery as well as a cutting balloon angioplasty for treatment of in-stent restenosis coronary artery bypass grafting in 1994.  [name] RIGHT/LEFT HEART CATH AND CORONARY/GRAFT ANGIOGRAPHY N/A 08/14/2017   Procedure: RIGHT/LEFT HEART CATH AND  CORONARY/GRAFT ANGIOGRAPHY;  Surgeon: Sherren Mocha, MD;  Location: [place] [facility];  Service: Cardiovascular;  Laterality: N/A;  . TEE WITHOUT CARDIOVERSION N/A 09/17/2017   Procedure: TRANSESOPHAGEAL ECHOCARDIOGRAM (TEE);  Surgeon: Sherren Mocha, MD;  Location: [place];  Service: Open Heart Surgery;  Laterality: N/A;    Social History: Social History   Tobacco Use  . Smoking status: Former Smoker    Quit date: 04/27/1993    Years since quitting: 25.7  . Smokeless tobacco: Never Used  Substance Use Topics  . Alcohol use: No    Alcohol/week: 0.0 standard drinks  . Drug use: No    Please also refer to relevant sections of EMR.  Family History: Family History  Problem Relation Age of Onset  . Coronary artery disease Mother   . Heart disease Mother        Before age 58  . Hyperlipidemia Mother   . Hypertension Mother   . Heart attack Mother   . Coronary artery disease Father   . Heart disease Father        After age 46  . Heart attack Father   . Heart disease Sister        After age 76  . Heart disease Brother        After age 47  . Coronary artery disease Other        13 sibling, almost all have coronary disease and some with premature onset  . Heart disease Other        13 sibling, almost all have coronary disease and some with premature onset     Allergies and Medications: Allergies  Allergen Reactions  . Bactrim [Sulfamethoxazole-Trimethoprim] Other (See Comments)    "Allergic," per MAR  . Levaquin [Levofloxacin In D5w] Diarrhea and Other (See Comments)    "Allergic," per MAR   No current facility-administered medications on file prior to encounter.    Current Outpatient Medications on File Prior to Encounter  Medication Sig Dispense Refill  . allopurinol (ZYLOPRIM) 300 MG tablet Take 300 mg by mouth daily.    . Amino Acids-Protein Hydrolys (PRO-STAT) LIQD Take 30 mLs by mouth 2 (two) times a day.    [name] apixaban (ELIQUIS) 5 MG TABS tablet Take 1 tablet  (5 mg total) by mouth 2 (two) times daily. 60 tablet   . atorvastatin (LIPITOR) 80 MG tablet Take 1 tablet (80 mg total) by mouth at bedtime. 90 tablet  3  . benazepril (LOTENSIN) 5 MG tablet Take 1 tablet (5 mg total) by mouth daily. 30 tablet 0  . bisacodyl (DULCOLAX) 5 MG EC tablet Take 1 tablet (5 mg total) by mouth daily. 30 tablet 0  . brimonidine (ALPHAGAN) 0.2 % ophthalmic solution Place 1 drop into both eyes 2 (two) times daily.    . budesonide-formoterol (SYMBICORT) 80-4.5 MCG/ACT inhaler Inhale 1 puff into the lungs as needed for shortness of breath.    . ferrous sulfate 325 (65 FE) MG tablet Take 325 mg by mouth daily with breakfast.     . furosemide (LASIX) 80 MG tablet Take 1 tablet (80 mg total) by mouth daily. 30 tablet 0  . Gauze Pads & Dressings (FOAM DRESSING) PADS Apply 1 patch topically See admin instructions. Apply a foam dressing to sacrum every day AND as needed for redness    . HYDROcodone-acetaminophen (NORCO/VICODIN) 5-325 MG tablet Take 1-2 tablets by mouth every 4 (four) hours as needed for moderate pain or severe pain. 30 tablet 0  . insulin aspart (NOVOLOG FLEXPEN) 100 UNIT/ML FlexPen Inject 2 Units into the skin See admin instructions. Inject 2 units into the skin at 9 AM in the morning for a BGL >200    . lansoprazole (PREVACID) 30 MG capsule Take 30 mg by mouth daily before breakfast.     . levalbuterol (XOPENEX) 0.63 MG/3ML nebulizer solution Take 3 mLs (0.63 mg total) by nebulization every 6 (six) hours as needed for wheezing or shortness of breath. (Patient taking differently: Take 0.63 mg by nebulization 3 (three) times daily. ) 3 mL 12  . metFORMIN (GLUCOPHAGE-XR) 500 MG 24 hr tablet Take 500 mg by mouth daily.    . metoprolol tartrate (LOPRESSOR) 25 MG tablet Take 1 tablet (25 mg total) by mouth 2 (two) times daily.    . Multiple Vitamin (MULTIVITAMIN WITH MINERALS) TABS Take 1 tablet by mouth daily.    . NON FORMULARY Take 240 mLs by mouth See admin  instructions. MedPass: Drink 240 ml's by mouth two times a day    . OXYGEN Inhale 3-4 L/min into the lungs continuous. 3 liters/min continuously TO MAINTAIN SATS <90% and may increase to 4 liters/min as needed with dyspnea upon exertion or shortness of breath    . polyethylene glycol (MIRALAX / GLYCOLAX) 17 g packet Take 17 g by mouth 2 (two) times daily. (Patient taking differently: Take 17 g by mouth 2 (two) times daily. MIXED IN LIQUID DRINK) 14 each 0  . tamsulosin (FLOMAX) 0.4 MG CAPS capsule Take 1 capsule (0.4 mg total) by mouth daily after supper. (Patient taking differently: Take 0.4 mg by mouth every evening. ) 30 capsule   . trolamine salicylate (ASPERCREME) 10 % cream Apply 1 application topically every 6 (six) hours as needed for muscle pain.     [name] umeclidinium bromide (INCRUSE ELLIPTA) 62.5 MCG/INH AEPB Inhale 1 puff into the lungs daily. (Patient taking differently: Inhale 1 puff into the lungs at bedtime. ) 1 each 0    Objective: BP 111/60   Pulse (!) 101   Temp 98.1 F (36.7 C) (Oral)   Resp (!) 21   Ht 6' (1.829 m)   Wt 81.3 kg   SpO2 100%   BMI 24.31 kg/m  Exam: General: Well-nourished well-developed.  Mildly distressed.  Age-appropriate Eyes: Direct and consensual light present.  ENTM: Tympanic membrane not well visualized due to wax.  Patent nares.  Neck: Supple.  No jugular distention Cardiovascular: Irregularly irregular  rhythm.Tachycardic.  No murmurs gallops or rubs. Respiratory: Forced air breathing.  Right lobe clear to auscultation.  Low flow audible consolidation.  Audible wheezing. Gastrointestinal: Normoactive bowel sounds.  No abdominal tenderness.  No abdominal distention MSK: Weak disposition.  Normal tone. Derm: Multiple aging spots present on head face arms and shins Neuro: Alert and oriented Psych: Appropriate mood  Labs and Imaging: CBC BMET  Recent Labs  Lab 01/19/19 1408  WBC 8.0  HGB 9.8*  HCT 32.0*  PLT 421*   Recent Labs  Lab  01/19/19 1408  NA 137  K 4.0  CL 94*  CO2 31  BUN 13  CREATININE 0.64  GLUCOSE 134*  CALCIUM 8.8*     EKG: Heart rate 111, A. fib, nonspecific ST-T wave changes, substantial artifact, abnormal  PORTABLE CHEST 1 VIEW; IMPRESSION: Cardiomegaly with pulmonary vascular congestion, small bilateral pleural effusions and possible mild interstitial edema.  LEFT LOWER lung atelectasis/airspace disease.   Gerlene Fee, DO 01/19/2019, 3:23 PM PGY-1, [place] Intern pager: [phone], text pages welcome  FPTS Upper-Level Resident Addendum  I have independently interviewed and examined the patient. I have discussed the above with the original author and agree with their documentation. My edits for correction/addition/clarification are in - blue. Please see also any attending notes.    Wilber Oliphant, M.D.  PGY-2  Family Medicine Teaching Service 01/19/2019 9:55 PM  [place] Service pager: [phone] (text pages welcome through [place])

## 2019-01-19 NOTE — Consult Note (Signed)
In error

## 2019-01-19 NOTE — Consult Note (Signed)
NAME:  [name], MRN:  [id number], DOB:  1931-10-20, LOS: 0 ADMISSION DATE:  01/19/2019, CONSULTATION DATE:  01/19/2019 REFERRING MD:  McDiarmid - [facility], CHIEF COMPLAINT:  SOB   Brief History   85 yo M  Presented to ED with from [facility] with CC SOB. Recently admitted with dyspnea, bilateral pleural effusions s/p bilateral thoracentesis 7/30, 7/31.   History of present illness   85 yo M extensive PMH including COPD on 2-4L [place], CHF s/p TAVR, CAD sp CABG, Afib on eliquis, recent PE who presented to ED 8/17 with worsening SOB. Patient states this has been worsening since discharge. He states "Dr. Tamala Julian was so nice before, he drained both my lungs," in reference to recent hospitalization (7/29 admission- 8/04 discharge) during which PCCM performed bilateral thoracentesis for the patient due to pleural effusions. Patient was discharged to [facility].  Reportedly, coordinated efforts have been made for repeat thoracentesis later this week however the patients dyspnea has prompted ED presentation today.   In ED, patient SpO2 100% on 5LNC. His home regimen is 2-4L, we weaned his [place] to 2L, SpO2 remained 100%.  In ED patient started on vanc and zosyn. Patient WBC 8.0, patient is afebrile.  BNP 222.4 Alk Phos 165, AST 33 ALT 30  Coagulation labs have not been sent.   Past Medical History  CAD, s/p CABG MI Atrial Fibrillation on eliquis Aortic stenosis Carotid stenosis  CHF COPD on 2-4LNC GERD PUD Renal artery stenosis Sleep Apnea HTN HLD DM Depression  Significant Hospital Events   8/17 presents to ED from [facility] for dyspnea. Admitted to [facility]. PCCM consulted for dyspnea and small bilateral pleural effusions   Consults:  PCCM  Procedures:    Significant Diagnostic Tests:  CXR 8/17> LLL atelectasis. Bilateral pleural effusion. Cardiomegaly. Pulmonary vascular congestion. Possible interstitial edema   Micro Data:  SARS Cov2> negative 8/17  Antimicrobials:  Vanc 8/17    Zosyn 8/17  Interim history/subjective:  PCCM sees patient in ED. Patient is mildly dyspneic in ED. He is on [facility] however SpO2 is 100%. PCCM decreases O2 to 2LNC. (Home 2-4LNC)   Objective   Blood pressure 133/62, pulse 98, temperature 98.1 F (36.7 C), temperature source Oral, resp. rate 19, height 6' (1.829 m), weight 81.3 kg, SpO2 100 %.        Intake/Output Summary (Last 24 hours) at 01/19/2019 1721 Last data filed at 01/19/2019 1651    Gross per 24 hour  Intake --  Output 720 ml  Net -720 ml      Filed Weights   01/19/19 1402  Weight: 81.3 kg    Examination: General: WDWN frail elderly appearing male, supine in stretcher NAd HENT: NCAT, trachea midline. Patent nares with [place] in place Lungs: CTA, diminished bibasilar sounds. Symmetrical chest expansion. No accessory muscle use Cardiovascular: IRIR. s1s2 Cap refill < 3 sec BUE. Trace BLE edema Abdomen: Soft round ndnt, normoactive  Extremities: Symmetrical bulk and tone. No obvious joint deformity. No cyanosis Neuro: AAOx3. Following commands.  GU: deferred Skin: Scattered ecchymosis most prevalent BUE. Skin is thin appearing. Distal LUE has gauze dressing in place which appears c/d/i.    Resolved Hospital Problem list   None  I reviewed CXR myself, pulmonary edema, pleural effusion and hyperinflation noted.  Assessment & Plan:  85 year old male with PMH of CHF, recent PE and COPD history who presents to PCCM with SOB.  Patient was seen on 7/30 for pleural effusion and bilateral thoracentesis were  done with clear fluid removed.  On exam, diminished BS diffusely.  Discussed with PCCM-NP.  Pleural effusion: appeared transudative last time after discussing it with the operator.  Unfortunately no fluid analysis noted.  Likely due to heart failure to speed of recurrence.  Patient is on NOAC for PE and a-fib.  - No thoracentesis for now  - Check INR in AM  - Active diureses today  - Repeat CXR in AM  - Will consider  thora only if no response subjectively to diureses.  - CHF related pleural effusion that recurrent treatment is to diurese and treat CHF not frequent thoracentesis  Hypoxemia: baseline O2 of 3L at rest only requiring 2  - Titrate O2 for sat of 88-92%  - Hopefully will be able to ambulate more with diureses  COPD: no sign of exacerbation  - No steroids  - BD per home regiment  ?HCAP: WBC is normal.    - Check PCT, if normal then low threshold for d/c of abx.  - F/U on cultures if already ordered  Pulmonary edema:  - Diureses as above  Atelectasis:  - IS per RT protocol  - Ambulate as able  - OOB to chair  Labs   CBC: Recent Labs  Lab 01/19/19 1408  WBC 8.0  NEUTROABS 5.5  HGB 9.8*  HCT 32.0*  MCV 87.9  PLT 421*    Basic Metabolic Panel: Recent Labs  Lab 01/19/19 1408  NA 137  K 4.0  CL 94*  CO2 31  GLUCOSE 134*  BUN 13  CREATININE 0.64  CALCIUM 8.8*   GFR: Estimated Creatinine Clearance: 74.1 mL/min (by C-G formula based on SCr of 0.64 mg/dL). Recent Labs  Lab 01/19/19 1408  WBC 8.0    Liver Function Tests: Recent Labs  Lab 01/19/19 1408  AST 33  ALT 30  ALKPHOS 165*  BILITOT 0.7  PROT 5.8*  ALBUMIN 2.6*   No results for input(s): LIPASE, AMYLASE in the last 168 hours. No results for input(s): AMMONIA in the last 168 hours.  ABG    Component Value Date/Time   PHART 7.567 (H) 01/01/2019 0740   PCO2ART 31.8 (L) 01/01/2019 0740   PO2ART 133 (H) 01/01/2019 0740   HCO3 29.3 (H) 01/01/2019 0740   TCO2 33 (H) 12/17/2018 1128   ACIDBASEDEF TEST WILL BE CREDITED 09/13/2017 1049   O2SAT 99.6 01/01/2019 0740     Coagulation Profile: No results for input(s): INR, PROTIME in the last 168 hours.  Cardiac Enzymes: No results for input(s): CKTOTAL, CKMB, CKMBINDEX, TROPONINI in the last 168 hours.  HbA1C: Hgb A1c MFr Bld  Date/Time Value Ref Range Status  12/17/2018 04:23 PM 6.1 (H) 4.8 - 5.6 % Final    Comment:    (NOTE) Pre diabetes:           5.7%-6.4% Diabetes:              >6.4% Glycemic control for   <7.0% adults with diabetes   09/13/2017 10:50 AM 6.1 (H) 4.8 - 5.6 % Final    Comment:    (NOTE) Pre diabetes:          5.7%-6.4% Diabetes:              >6.4% Glycemic control for   <7.0% adults with diabetes     CBG: No results for input(s): GLUCAP in the last 168 hours.  Review of Systems:   12 point ROS is negative other than above  Past Medical History  He,  has a past medical history of Acute myocardial infarction, unspecified site, initial episode of care, Anemia, Anxiety, Aortic stenosis ( ), Atrial fibrillation (06/27/2013), Atrial fibrillation (HCC), BPPV (benign paroxysmal positional vertigo) (2015), CAD (coronary artery disease), Carotid stenosis, CAROTID STENOSIS- moderate (09/09/2008), Cellulitis (11/15/2018), Chest pain with moderate risk of acute coronary syndrome (06/27/2013), CHF (congestive heart failure) ([place]), Chronic anticoagulation (06/27/2013), COPD (chronic obstructive pulmonary disease) ([place]), Depression, Diabetes mellitus without complication ([place]), GERD (gastroesophageal reflux disease), Gout attack- on steroid dose pack (06/27/2013), History of blood transfusion, History of peptic ulcer disease, Hyperlipidemia, Hypertension, Left atrial severely dilated (01/19/2019), Myocardial infarction [facility]), NSTEMI (non-ST elevated myocardial infarction) ([place]) (08/2013), 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   Surgical History    Past Surgical History:  Procedure Laterality Date   AORTIC ARCH ANGIOGRAPHY N/A 08/14/2017   Procedure: AORTIC ARCH ANGIOGRAPHY;  Surgeon: Sherren Mocha, MD;  Location: [place] [facility];  Service: Cardiovascular;  Laterality: N/A;   BOWEL RESECTION  07/2013   Bowel perforation   CARDIAC CATHETERIZATION     CAROTID ENDARTERECTOMY Right 2014   Curt Jews, MD   COLON SURGERY     CORONARY ARTERY BYPASS GRAFT  1994   ENDARTERECTOMY Right 01/05/2013   Procedure: ENDARTERECTOMY CAROTID;  Surgeon: Rosetta Posner, MD;  Location: [place];  Service: Vascular;  Laterality: Right;   INCISION AND DRAINAGE Left 11/16/2018   Procedure: INCISION AND DRAINAGE LEFT FOREARM/ARM;  Surgeon: Leanora Cover, MD;  Location: [place];  Service: Orthopedics;  Laterality: Left;   KNEE SURGERY  08/2003   left knee/ARTHROSCOPIC   LEFT HEART CATHETERIZATION WITH CORONARY/GRAFT ANGIOGRAM N/A 09/01/2013   Procedure: LEFT HEART CATHETERIZATION WITH Beatrix Fetters;  Surgeon: Sinclair Grooms, MD;  Location: [facility] CATH LAB;  Service: Cardiovascular;  Laterality: N/A;   PATCH ANGIOPLASTY Right 01/05/2013   Procedure: PATCH ANGIOPLASTY;  Surgeon: Rosetta Posner, MD;  Location: [place];  Service: Vascular;  Laterality: Right;   PERCUTANEOUS CORONARY STENT INTERVENTION (PCI-S)  09/01/2013   Procedure: PERCUTANEOUS CORONARY STENT INTERVENTION (PCI-S);  Surgeon: Sinclair Grooms, MD;  Location: [facility] CATH LAB;  Service: Cardiovascular;;   removal of bleeding ulcer  1994   RENAL ARTERY STENT     stenting of the left renal artery as well as a cutting balloon angioplasty for treatment of in-stent restenosis coronary artery bypass grafting in 1994.   RIGHT/LEFT HEART CATH AND CORONARY/GRAFT ANGIOGRAPHY N/A 08/14/2017   Procedure: RIGHT/LEFT HEART CATH AND CORONARY/GRAFT ANGIOGRAPHY;  Surgeon: Sherren Mocha, MD;  Location: [place] [facility];  Service: Cardiovascular;   Laterality: N/A;   TEE WITHOUT CARDIOVERSION N/A 09/17/2017   Procedure: TRANSESOPHAGEAL ECHOCARDIOGRAM (TEE);  Surgeon: Sherren Mocha, MD;  Location: [place];  Service: Open Heart Surgery;  Laterality: N/A;     Social History   reports that he quit smoking about 25 years ago. He has never used smokeless tobacco. He reports that he does not drink alcohol or use drugs.   Family History   His family history includes Coronary artery disease in his father, mother, and another family member; Heart attack in his father and  mother; Heart disease in his brother, father, mother, sister, and another family member; Hyperlipidemia in his mother; Hypertension in his mother.   Allergies Allergies  Allergen Reactions   Bactrim [Sulfamethoxazole-Trimethoprim] Other (See Comments)    "Allergic," per [facility]   Levaquin [Levofloxacin In D5w] Diarrhea and Other (See Comments)    "Allergic," per [facility]     Home Medications  Prior to Admission medications   Medication Sig Start Date End Date Taking? Authorizing Provider  allopurinol (ZYLOPRIM) 300 MG tablet Take 300 mg by mouth daily. 10/12/18  Yes [provider]  Amino Acids-Protein Hydrolys (PRO-STAT) LIQD Take 30 mLs by mouth 2 (two) times a day.   Yes [provider]  apixaban (ELIQUIS) 5 MG TABS tablet Take 1 tablet (5 mg total) by mouth 2 (two) times daily. 12/22/18 01/21/19 Yes Lurline Del, MD  atorvastatin (LIPITOR) 80 MG tablet Take 1 tablet (80 mg total) by mouth at bedtime. 12/07/14  Yes Lelon Perla, MD  benazepril (LOTENSIN) 5 MG tablet Take 1 tablet (5 mg total) by mouth daily. 12/23/18  Yes Lurline Del, MD  bisacodyl (DULCOLAX) 5 MG EC tablet Take 1 tablet (5 mg total) by mouth daily. 12/23/18  Yes Lurline Del, MD  brimonidine (ALPHAGAN) 0.2 % ophthalmic solution Place 1 drop into both eyes 2 (two) times daily.   Yes [provider]  budesonide-formoterol (SYMBICORT) 80-4.5 MCG/ACT inhaler Inhale 1 puff into the  lungs daily as needed ("for shortness of breath or wheezing- rinse mouth afterwards").  09/05/18  Yes [provider]  ferrous sulfate 325 (65 FE) MG tablet Take 325 mg by mouth daily with breakfast.    Yes [provider]  furosemide (LASIX) 80 MG tablet Take 1 tablet (80 mg total) by mouth daily. Patient taking differently: Take 80 mg by mouth 2 (two) times daily.  01/06/19  Yes Wilber Oliphant, MD  HYDROcodone-acetaminophen (NORCO/VICODIN) 5-325 MG tablet Take 1-2 tablets by mouth every 4 (four) hours as needed for moderate pain or severe pain. 01/06/19  Yes Lattie Haw, MD  insulin aspart (NOVOLOG FLEXPEN) 100 UNIT/ML FlexPen Inject 2 Units into the skin See admin instructions. Inject 2 units into the skin at 9 AM in the morning for a BGL >200   Yes [provider]  lansoprazole (PREVACID) 30 MG capsule Take 30 mg by mouth daily before breakfast.    Yes [provider]  levalbuterol (XOPENEX) 0.63 MG/3ML nebulizer solution Take 3 mLs (0.63 mg total) by nebulization every 6 (six) hours as needed for wheezing or shortness of breath. Patient taking differently: Take 0.63 mg by nebulization 3 (three) times daily.  12/22/18  Yes Lurline Del, MD  Melatonin 5 MG TABS Take 5 mg by mouth at bedtime.   Yes [provider]  metFORMIN (GLUCOPHAGE-XR) 500 MG 24 hr tablet Take 500 mg by mouth daily. 12/10/18  Yes [provider]  metoprolol tartrate (LOPRESSOR) 25 MG tablet Take 1 tablet (25 mg total) by mouth 2 (two) times daily. Patient taking differently: Take 25 mg by mouth daily.  01/06/19  Yes Lattie Haw, MD  Multiple Vitamin (MULTIVITAMIN WITH MINERALS) TABS Take 1 tablet by mouth daily.   Yes [provider]  NON FORMULARY Take 120 mLs by mouth See admin instructions. MedPass: Drink 120 ml's by mouth two times a day   Yes [provider]  OXYGEN Inhale 3 L/min into the lungs continuous. TO MAINTAIN SATS <90%   Yes [provider]   polyethylene  glycol (MIRALAX / GLYCOLAX) 17 g packet Take 17 g by mouth 2 (two) times daily. Patient taking differently: Take 17 g by mouth daily. MIXED IN LIQUID DRINK 12/22/18  Yes Lurline Del, MD  spironolactone (ALDACTONE) 25 MG tablet Take 25 mg by mouth daily.   Yes [provider]  tamsulosin (FLOMAX) 0.4 MG CAPS capsule Take 1 capsule (0.4 mg total) by mouth daily after supper. Patient taking differently: Take 0.4 mg by mouth every evening.  12/22/18  Yes Lurline Del, MD  trolamine salicylate (ASPERCREME) 10 % cream Apply 1 application topically every 6 (six) hours as needed for muscle pain.    Yes [provider]  umeclidinium bromide (INCRUSE ELLIPTA) 62.5 MCG/INH AEPB Inhale 1 puff into the lungs daily. 11/27/18  Yes Matilde Haymaker, MD  Gauze Pads & Dressings (FOAM DRESSING) PADS Apply 1 patch topically See admin instructions. Apply a foam dressing to sacrum every day AND as needed for redness    [provider]    Patient seen and examined, agree with above note.  I dictated the care and orders written for this patient under my direction.  Rush Farmer, M.D. [facility] Pulmonary/Critical Care Medicine. Pager: [phone]. After hours pager: [phone].

## 2019-01-19 NOTE — ED Notes (Signed)
ED TO INPATIENT HANDOFF REPORT  ED Nurse Name and Phone #: William Hamburger, RN  S Name/Age/Gender [name] 85 y.o. male Room/Bed: 019C/019C  Code Status   Code Status: Prior  Home/SNF/Other Nursing Home Patient oriented to: situation Is this baseline? Yes   Triage Complete: Triage complete  Chief Complaint sob from nursing home  Triage Note Sent from nursing home with shortness of breath.  No fever, N/V/D   Allergies Allergies  Allergen Reactions  . Bactrim [Sulfamethoxazole-Trimethoprim] Other (See Comments)    "Allergic," per MAR  . Levaquin [Levofloxacin In D5w] Diarrhea and Other (See Comments)    "Allergic," per MAR    Level of Care/Admitting Diagnosis ED Disposition    ED Disposition Condition [place]: [place] [[id number]]  Level of Care: Telemetry Cardiac [103]  Covid Evaluation: Asymptomatic Screening Protocol (No Symptoms)  Diagnosis: Pleural effusion [086761]  Admitting Physician: Wilber Oliphant [[id number]]  Attending Physician: MCDIARMID, TODD D [[id number]]  PT Class (Do Not Modify): Observation [104]  PT Acc Code (Do Not Modify): Observation [10022]       B Medical/Surgery History Past Medical History:  Diagnosis Date  . Acute myocardial infarction, unspecified site, initial episode of care   . Anemia   . Anxiety   . Aortic stenosis    . Atrial fibrillation 06/27/2013  . Atrial fibrillation ([place])    a. permanent, on Coumadin for anticoagulation  . BPPV (benign paroxysmal positional vertigo) 2015  . CAD (coronary artery disease)    a. s/p CABG in 1994 b. cath in 2015 showing normal LM, 100% LCx and RCA stenosis with 50-60% prox LAD stenosis and 100% mid-LAD stenosis; patent sequential SVG-OM2-OM3 and patent LIMA-LAD with PTCA of distal LAD via LIMA graft performed at that time  . Carotid stenosis   . CAROTID STENOSIS- moderate 09/09/2008   Qualifier: Diagnosis of  By: Burnett Kanaris    . Cellulitis 11/15/2018   . Chest pain with moderate risk of acute coronary syndrome 06/27/2013  . CHF (congestive heart failure) ([place])   . Chronic anticoagulation 06/27/2013  . COPD (chronic obstructive pulmonary disease) ([place])   . Depression   . Diabetes mellitus without complication ([place])    FASTING 98-120S  . GERD (gastroesophageal reflux disease)   . Gout attack- on steroid dose pack 06/27/2013  . History of blood transfusion   . History of peptic ulcer disease   . Hyperlipidemia   . Hypertension   . Left atrial severely dilated 01/19/2019  . Myocardial infarction ([place])    X2  . NSTEMI (non-ST elevated myocardial infarction) ([place]) 08/2013  . 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 PEPTIC ULCER DISEASE, HX OF 09/09/2008   Qualifier: Diagnosis of  By: Burnett Kanaris    . PERIPHERAL VASCULAR DISEASE 09/09/2008   Qualifier: Diagnosis of  By: Burnett Kanaris    . Peripheral vascular disease ([place])   . Pneumonia    HX OF  . Pressure injury of skin 12/17/2018  . Renal artery stenosis ([place])   . S/P thoracentesis   . Severe aortic stenosis 09/17/2017  . Sleep apnea    OXYGEN AT NIGHT 2L [place]  . Stress fracture of calcaneus 2018   Dr [facility])  . Tachycardia- beta blocker increased 06/26/2013  . UNSPECIFIED ANEMIA 09/09/2008   Qualifier: Diagnosis of  By: Burnett Kanaris    . UNSPECIFIED CEREBROVASCULAR DISEASE 09/09/2008   Qualifier: Diagnosis of  By: Burnett Kanaris    . Unstable angina ([place]) 08/31/2013  . Vertigo 06/27/2013   Past Surgical History:  Procedure Laterality Date  . AORTIC ARCH ANGIOGRAPHY N/A 08/14/2017   Procedure: AORTIC ARCH ANGIOGRAPHY;  Surgeon: Sherren Mocha, MD;  Location: [facility];  Service: Cardiovascular;  Laterality: N/A;  . BOWEL RESECTION  07/2013   Bowel perforation  . CARDIAC CATHETERIZATION    . CAROTID ENDARTERECTOMY Right 2014   Curt Jews, MD  . COLON SURGERY    . CORONARY ARTERY BYPASS GRAFT   1994  . ENDARTERECTOMY Right 01/05/2013   Procedure: ENDARTERECTOMY CAROTID;  Surgeon: Rosetta Posner, MD;  Location: [facility];  Service: Vascular;  Laterality: Right;  . INCISION AND DRAINAGE Left 11/16/2018   Procedure: INCISION AND DRAINAGE LEFT FOREARM/ARM;  Surgeon: Leanora Cover, MD;  Location: [facility];  Service: Orthopedics;  Laterality: Left;  . KNEE SURGERY  08/2003   left knee/ARTHROSCOPIC  . LEFT HEART CATHETERIZATION WITH CORONARY/GRAFT ANGIOGRAM N/A 09/01/2013   Procedure: LEFT HEART CATHETERIZATION WITH Beatrix Fetters;  Surgeon: Sinclair Grooms, MD;  Location: [facility] CATH LAB;  Service: Cardiovascular;  Laterality: N/A;  . PATCH ANGIOPLASTY Right 01/05/2013   Procedure: PATCH ANGIOPLASTY;  Surgeon: Rosetta Posner, MD;  Location: [facility];  Service: Vascular;  Laterality: Right;  . PERCUTANEOUS CORONARY STENT INTERVENTION (PCI-S)  09/01/2013   Procedure: PERCUTANEOUS CORONARY STENT INTERVENTION (PCI-S);  Surgeon: Sinclair Grooms, MD;  Location: [facility] CATH LAB;  Service: Cardiovascular;;  . removal of bleeding ulcer  1994  . RENAL ARTERY STENT     stenting of the left renal artery as well as a cutting balloon angioplasty for treatment of in-stent restenosis coronary artery bypass grafting in 1994.  [name] RIGHT/LEFT HEART CATH AND CORONARY/GRAFT ANGIOGRAPHY N/A 08/14/2017   Procedure: RIGHT/LEFT HEART CATH AND CORONARY/GRAFT ANGIOGRAPHY;  Surgeon: Sherren Mocha, MD;  Location: [facility];  Service: Cardiovascular;  Laterality: N/A;  . TEE WITHOUT CARDIOVERSION N/A 09/17/2017   Procedure: TRANSESOPHAGEAL ECHOCARDIOGRAM (TEE);  Surgeon: Sherren Mocha, MD;  Location: [facility];  Service: Open Heart Surgery;  Laterality: N/A;     A IV Location/Drains/Wounds Patient Lines/Drains/Airways Status   Active Line/Drains/Airways    Name:   Placement date:   Placement time:   Site:   Days:   Peripheral IV 01/19/19 Right Arm   01/19/19    1500    Arm   less than 1   Urethral Catheter Idolina Primer, RN 16  Fr.   12/22/18    1626    -   28   Pressure Injury 12/17/18 Buttocks Right Stage II -  Partial thickness loss of dermis presenting as a shallow open ulcer with a red, pink wound bed without slough.   12/17/18    1430     33   Pressure Injury 12/17/18 Heel Right Unstageable - Full thickness tissue loss in which the base of the ulcer is covered by slough (yellow, tan, gray, green or brown) and/or eschar (tan, brown or black) in the wound bed.   12/17/18    1430     33   Wound / Incision (Open or Dehisced) 11/19/18 Other (Comment) Arm Left left dorsal cellulitic wound post I and D   11/19/18    -    Arm   61   Wound / Incision (Open or Dehisced) 11/19/18 Other (Comment) Arm Left ventral forearm wound post I and D   11/19/18    -  Arm   61   Wound / Incision (Open or Dehisced) 01/01/19 Non-pressure wound Toe (Comment  which one) Anterior;Left unstageable   01/01/19    0600    Toe (Comment  which one)   18          Intake/Output Last 24 hours  Intake/Output Summary (Last 24 hours) at 01/19/2019 1749 Last data filed at 01/19/2019 1651 Gross per 24 hour  Intake -  Output 720 ml  Net -720 ml    Labs/Imaging Results for orders placed or performed during the hospital encounter of 01/19/19 (from the past 48 hour(s))  Comprehensive metabolic panel     Status: Abnormal   Collection Time: 01/19/19  2:08 PM  Result Value Ref Range   Sodium 137 135 - 145 mmol/L   Potassium 4.0 3.5 - 5.1 mmol/L   Chloride 94 (L) 98 - 111 mmol/L   CO2 31 22 - 32 mmol/L   Glucose, Bld 134 (H) 70 - 99 mg/dL   BUN 13 8 - 23 mg/dL   Creatinine, Ser 0.64 0.61 - 1.24 mg/dL   Calcium 8.8 (L) 8.9 - 10.3 mg/dL   Total Protein 5.8 (L) 6.5 - 8.1 g/dL   Albumin 2.6 (L) 3.5 - 5.0 g/dL   AST 33 15 - 41 U/L   ALT 30 0 - 44 U/L   Alkaline Phosphatase 165 (H) 38 - 126 U/L   Total Bilirubin 0.7 0.3 - 1.2 mg/dL   GFR calc non Af Amer >60 >60 mL/min   GFR calc Af Amer >60 >60 mL/min   Anion gap 12 5 - 15    Comment: Performed  at [facility], [address] [place], [place] [address]  CBC WITH DIFFERENTIAL     Status: Abnormal   Collection Time: 01/19/19  2:08 PM  Result Value Ref Range   WBC 8.0 4.0 - 10.5 K/uL   RBC 3.64 (L) 4.22 - 5.81 MIL/uL   Hemoglobin 9.8 (L) 13.0 - 17.0 g/dL   HCT 32.0 (L) 39.0 - 52.0 %   MCV 87.9 80.0 - 100.0 fL   MCH 26.9 26.0 - 34.0 pg   MCHC 30.6 30.0 - 36.0 g/dL   RDW 15.9 (H) 11.5 - 15.5 %   Platelets 421 (H) 150 - 400 K/uL   nRBC 0.0 0.0 - 0.2 %   Neutrophils Relative % 69 %   Neutro Abs 5.5 1.7 - 7.7 K/uL   Lymphocytes Relative 13 %   Lymphs Abs 1.1 0.7 - 4.0 K/uL   Monocytes Relative 10 %   Monocytes Absolute 0.8 0.1 - 1.0 K/uL   Eosinophils Relative 7 %   Eosinophils Absolute 0.6 (H) 0.0 - 0.5 K/uL   Basophils Relative 1 %   Basophils Absolute 0.1 0.0 - 0.1 K/uL   Immature Granulocytes 0 %   Abs Immature Granulocytes 0.03 0.00 - 0.07 K/uL    Comment: Performed at [facility], [place] [address] [place], [place] [address]  SARS Coronavirus 2 [facility] order, Performed in [facility] hospital lab) Nasopharyngeal Nasopharyngeal Swab     Status: None   Collection Time: 01/19/19  2:08 PM   Specimen: Nasopharyngeal Swab  Result Value Ref Range   SARS Coronavirus 2 NEGATIVE NEGATIVE    Comment: (NOTE) If result is NEGATIVE SARS-CoV-2 target nucleic acids are NOT DETECTED. The SARS-CoV-2 RNA is generally detectable in upper and lower  respiratory specimens during the acute phase of infection. The lowest  concentration of SARS-CoV-2  viral copies this assay can detect is 250  copies / mL. A negative result does not preclude SARS-CoV-2 infection  and should not be used as the sole basis for treatment or other  patient management decisions.  A negative result may occur with  improper specimen collection / handling, submission of specimen other  than nasopharyngeal swab, presence of viral mutation(s) within the  areas targeted by this assay, and inadequate number  of viral copies  (<250 copies / mL). A negative result must be combined with clinical  observations, patient history, and epidemiological information. If result is POSITIVE SARS-CoV-2 target nucleic acids are DETECTED. The SARS-CoV-2 RNA is generally detectable in upper and lower  respiratory specimens dur ing the acute phase of infection.  Positive  results are indicative of active infection with SARS-CoV-2.  Clinical  correlation with patient history and other diagnostic information is  necessary to determine patient infection status.  Positive results do  not rule out bacterial infection or co-infection with other viruses. If result is PRESUMPTIVE POSTIVE SARS-CoV-2 nucleic acids MAY BE PRESENT.   A presumptive positive result was obtained on the submitted specimen  and confirmed on repeat testing.  While 2019 novel coronavirus  (SARS-CoV-2) nucleic acids may be present in the submitted sample  additional confirmatory testing may be necessary for epidemiological  and / or clinical management purposes  to differentiate between  SARS-CoV-2 and other Sarbecovirus currently known to infect humans.  If clinically indicated additional testing with an alternate test  methodology [phone]) is advised. The SARS-CoV-2 RNA is generally  detectable in upper and lower respiratory sp ecimens during the acute  phase of infection. The expected result is Negative. Fact Sheet for Patients:  StrictlyIdeas.no Fact Sheet for Healthcare Providers: BankingDealers.co.za This test is not yet approved or cleared by the [place] FDA and has been authorized for detection and/or diagnosis of SARS-CoV-2 by FDA under an Emergency Use Authorization (EUA).  This EUA will remain in effect (meaning this test can be used) for the duration of the COVID-19 declaration under Section 564(b)(1) of the Act, 21 U.S.C. section 360bbb-3(b)(1), unless the authorization is  terminated or revoked sooner. Performed at [facility], [place] [address] [place], [place] [address]   Brain natriuretic peptide     Status: Abnormal   Collection Time: 01/19/19  2:08 PM  Result Value Ref Range   B Natriuretic Peptide 222.4 (H) 0.0 - 100.0 pg/mL    Comment: Performed at [facility] [address], [place], [place] [address]  Urinalysis, Routine w reflex microscopic     Status: Abnormal   Collection Time: 01/19/19  4:47 PM  Result Value Ref Range   Color, Urine AMBER (A) YELLOW    Comment: BIOCHEMICALS MAY BE AFFECTED BY COLOR   APPearance CLOUDY (A) CLEAR   Specific Gravity, Urine 1.012 1.005 - 1.030   pH 9.0 (H) 5.0 - 8.0   Glucose, UA NEGATIVE NEGATIVE mg/dL   Hgb urine dipstick MODERATE (A) NEGATIVE   Bilirubin Urine NEGATIVE NEGATIVE   Ketones, ur NEGATIVE NEGATIVE mg/dL   Protein, ur 100 (A) NEGATIVE mg/dL   Nitrite POSITIVE (A) NEGATIVE   Leukocytes,Ua MODERATE (A) NEGATIVE   RBC / HPF >50 (H) 0 - 5 RBC/hpf   WBC, UA 21-50 0 - 5 WBC/hpf   Bacteria, UA FEW (A) NONE SEEN   Squamous Epithelial / LPF 0-5 0 - 5   WBC Clumps PRESENT    Mucus PRESENT  Hyaline Casts, UA PRESENT    Amorphous Crystal PRESENT    Triple Phosphate Crystal PRESENT     Comment: Performed at [facility], [place] [address], [place], [place] [address]   Dg Chest Port 1 View  Result Date: 01/19/2019 CLINICAL DATA:  85 year old with shortness of breath EXAM: PORTABLE CHEST 1 VIEW COMPARISON:  01/02/2019 and prior radiographs FINDINGS: Cardiomegaly, pulmonary vascular congestion and mild interstitial opacities noted. Small pleural effusions are present. LEFT LOWER lung atelectasis/airspace disease identified. There is no evidence of pneumothorax. IMPRESSION: Cardiomegaly with pulmonary vascular congestion, small bilateral pleural effusions and possible mild interstitial edema. LEFT LOWER lung atelectasis/airspace disease. Electronically Signed   By: Margarette Canada M.D.   On:  01/19/2019 14:58    Pending Labs Unresulted Labs (From admission, onward)    Start     Ordered   01/20/19 0500  Protime-INR  Tomorrow morning,   R     01/19/19 1733   01/20/19 4627  Basic metabolic panel  Tomorrow morning,   R     01/19/19 1733   01/19/19 1733  Procalcitonin - Baseline  ONCE - STAT,   STAT     01/19/19 1733          Vitals/Pain Today's Vitals   01/19/19 1630 01/19/19 1645 01/19/19 1700 01/19/19 1715  BP: 125/64 127/74 130/68 (!) 114/59  Pulse: 92 95 93 94  Resp: (!) 26 (!) 24 (!) 39 (!) 24  Temp:      TempSrc:      SpO2: 100% 100% 100% 100%  Weight:      Height:        Isolation Precautions No active isolations  Medications Medications  vancomycin (VANCOCIN) IVPB 1000 mg/200 mL premix (1,000 mg Intravenous New Bag/Given 01/19/19 1724)  furosemide (LASIX) injection 40 mg (has no administration in time range)  potassium chloride SA (K-DUR) CR tablet 40 mEq (has no administration in time range)  furosemide (LASIX) injection 40 mg (40 mg Intravenous Given 01/19/19 1642)  piperacillin-tazobactam (ZOSYN) IVPB 3.375 g (0 g Intravenous Stopped 01/19/19 1712)    Mobility walks with person assist Low fall risk   Focused Assessments Pulmonary Assessment Handoff:  Lung sounds:            R Recommendations: See Admitting Provider Note  Report given to:   Additional Notes: 20 g RAC. Lasix 40 mg x1 given + IV ABX. (vanc & zosyn); Foley cath inplaced on arrival. A&Ox3

## 2019-01-19 NOTE — ED Triage Notes (Signed)
Sent from nursing home with shortness of breath.  No fever, N/V/D

## 2019-01-20 ENCOUNTER — Other Ambulatory Visit: Payer: Self-pay

## 2019-01-20 ENCOUNTER — Observation Stay (HOSPITAL_COMMUNITY): Payer: Medicare Other

## 2019-01-20 ENCOUNTER — Encounter (HOSPITAL_COMMUNITY): Payer: Self-pay | Admitting: Family Medicine

## 2019-01-20 DIAGNOSIS — L03114 Cellulitis of left upper limb: Secondary | ICD-10-CM | POA: Diagnosis not present

## 2019-01-20 DIAGNOSIS — J9611 Chronic respiratory failure with hypoxia: Secondary | ICD-10-CM | POA: Diagnosis not present

## 2019-01-20 DIAGNOSIS — R0689 Other abnormalities of breathing: Secondary | ICD-10-CM | POA: Diagnosis not present

## 2019-01-20 DIAGNOSIS — J449 Chronic obstructive pulmonary disease, unspecified: Secondary | ICD-10-CM

## 2019-01-20 DIAGNOSIS — R0602 Shortness of breath: Secondary | ICD-10-CM | POA: Diagnosis not present

## 2019-01-20 DIAGNOSIS — M549 Dorsalgia, unspecified: Secondary | ICD-10-CM | POA: Diagnosis present

## 2019-01-20 DIAGNOSIS — J810 Acute pulmonary edema: Secondary | ICD-10-CM | POA: Diagnosis not present

## 2019-01-20 DIAGNOSIS — Z7189 Other specified counseling: Secondary | ICD-10-CM | POA: Diagnosis not present

## 2019-01-20 DIAGNOSIS — M2550 Pain in unspecified joint: Secondary | ICD-10-CM | POA: Diagnosis not present

## 2019-01-20 DIAGNOSIS — E46 Unspecified protein-calorie malnutrition: Secondary | ICD-10-CM | POA: Diagnosis not present

## 2019-01-20 DIAGNOSIS — Z960 Presence of urogenital implants: Secondary | ICD-10-CM | POA: Diagnosis not present

## 2019-01-20 DIAGNOSIS — I5033 Acute on chronic diastolic (congestive) heart failure: Secondary | ICD-10-CM | POA: Diagnosis present

## 2019-01-20 DIAGNOSIS — I110 Hypertensive heart disease with heart failure: Secondary | ICD-10-CM | POA: Diagnosis present

## 2019-01-20 DIAGNOSIS — Z7901 Long term (current) use of anticoagulants: Secondary | ICD-10-CM | POA: Diagnosis not present

## 2019-01-20 DIAGNOSIS — E785 Hyperlipidemia, unspecified: Secondary | ICD-10-CM | POA: Diagnosis present

## 2019-01-20 DIAGNOSIS — R3129 Other microscopic hematuria: Secondary | ICD-10-CM | POA: Diagnosis present

## 2019-01-20 DIAGNOSIS — E8809 Other disorders of plasma-protein metabolism, not elsewhere classified: Secondary | ICD-10-CM

## 2019-01-20 DIAGNOSIS — I4821 Permanent atrial fibrillation: Secondary | ICD-10-CM | POA: Diagnosis present

## 2019-01-20 DIAGNOSIS — H409 Unspecified glaucoma: Secondary | ICD-10-CM | POA: Diagnosis not present

## 2019-01-20 DIAGNOSIS — K219 Gastro-esophageal reflux disease without esophagitis: Secondary | ICD-10-CM | POA: Diagnosis present

## 2019-01-20 DIAGNOSIS — G8929 Other chronic pain: Secondary | ICD-10-CM | POA: Diagnosis present

## 2019-01-20 DIAGNOSIS — R5381 Other malaise: Secondary | ICD-10-CM | POA: Diagnosis not present

## 2019-01-20 DIAGNOSIS — D649 Anemia, unspecified: Secondary | ICD-10-CM | POA: Diagnosis present

## 2019-01-20 DIAGNOSIS — Z794 Long term (current) use of insulin: Secondary | ICD-10-CM | POA: Diagnosis not present

## 2019-01-20 DIAGNOSIS — I10 Essential (primary) hypertension: Secondary | ICD-10-CM | POA: Diagnosis not present

## 2019-01-20 DIAGNOSIS — J9811 Atelectasis: Secondary | ICD-10-CM | POA: Diagnosis present

## 2019-01-20 DIAGNOSIS — Z7401 Bed confinement status: Secondary | ICD-10-CM | POA: Diagnosis not present

## 2019-01-20 DIAGNOSIS — N400 Enlarged prostate without lower urinary tract symptoms: Secondary | ICD-10-CM | POA: Diagnosis present

## 2019-01-20 DIAGNOSIS — Z978 Presence of other specified devices: Secondary | ICD-10-CM

## 2019-01-20 DIAGNOSIS — I959 Hypotension, unspecified: Secondary | ICD-10-CM | POA: Diagnosis not present

## 2019-01-20 DIAGNOSIS — J90 Pleural effusion, not elsewhere classified: Secondary | ICD-10-CM | POA: Diagnosis not present

## 2019-01-20 DIAGNOSIS — J189 Pneumonia, unspecified organism: Secondary | ICD-10-CM | POA: Diagnosis present

## 2019-01-20 DIAGNOSIS — I5030 Unspecified diastolic (congestive) heart failure: Secondary | ICD-10-CM | POA: Diagnosis present

## 2019-01-20 DIAGNOSIS — R8281 Pyuria: Secondary | ICD-10-CM | POA: Diagnosis present

## 2019-01-20 DIAGNOSIS — Z9981 Dependence on supplemental oxygen: Secondary | ICD-10-CM | POA: Diagnosis not present

## 2019-01-20 DIAGNOSIS — Z20828 Contact with and (suspected) exposure to other viral communicable diseases: Secondary | ICD-10-CM | POA: Diagnosis present

## 2019-01-20 DIAGNOSIS — I2510 Atherosclerotic heart disease of native coronary artery without angina pectoris: Secondary | ICD-10-CM | POA: Diagnosis present

## 2019-01-20 DIAGNOSIS — E1151 Type 2 diabetes mellitus with diabetic peripheral angiopathy without gangrene: Secondary | ICD-10-CM | POA: Diagnosis present

## 2019-01-20 DIAGNOSIS — R0902 Hypoxemia: Secondary | ICD-10-CM | POA: Diagnosis not present

## 2019-01-20 DIAGNOSIS — I272 Pulmonary hypertension, unspecified: Secondary | ICD-10-CM

## 2019-01-20 DIAGNOSIS — M1990 Unspecified osteoarthritis, unspecified site: Secondary | ICD-10-CM | POA: Diagnosis not present

## 2019-01-20 DIAGNOSIS — L89159 Pressure ulcer of sacral region, unspecified stage: Secondary | ICD-10-CM | POA: Diagnosis present

## 2019-01-20 DIAGNOSIS — R823 Hemoglobinuria: Secondary | ICD-10-CM | POA: Diagnosis present

## 2019-01-20 DIAGNOSIS — I4891 Unspecified atrial fibrillation: Secondary | ICD-10-CM | POA: Diagnosis not present

## 2019-01-20 DIAGNOSIS — K5900 Constipation, unspecified: Secondary | ICD-10-CM | POA: Diagnosis present

## 2019-01-20 DIAGNOSIS — I050 Rheumatic mitral stenosis: Secondary | ICD-10-CM | POA: Diagnosis not present

## 2019-01-20 DIAGNOSIS — J9621 Acute and chronic respiratory failure with hypoxia: Secondary | ICD-10-CM | POA: Diagnosis present

## 2019-01-20 DIAGNOSIS — R8271 Bacteriuria: Secondary | ICD-10-CM | POA: Diagnosis present

## 2019-01-20 DIAGNOSIS — Z515 Encounter for palliative care: Secondary | ICD-10-CM | POA: Diagnosis not present

## 2019-01-20 DIAGNOSIS — Y95 Nosocomial condition: Secondary | ICD-10-CM | POA: Diagnosis present

## 2019-01-20 DIAGNOSIS — I5031 Acute diastolic (congestive) heart failure: Secondary | ICD-10-CM

## 2019-01-20 DIAGNOSIS — J440 Chronic obstructive pulmonary disease with acute lower respiratory infection: Secondary | ICD-10-CM | POA: Diagnosis present

## 2019-01-20 DIAGNOSIS — E119 Type 2 diabetes mellitus without complications: Secondary | ICD-10-CM | POA: Diagnosis not present

## 2019-01-20 DIAGNOSIS — G4733 Obstructive sleep apnea (adult) (pediatric): Secondary | ICD-10-CM

## 2019-01-20 DIAGNOSIS — G4700 Insomnia, unspecified: Secondary | ICD-10-CM | POA: Diagnosis not present

## 2019-01-20 DIAGNOSIS — Z9889 Other specified postprocedural states: Secondary | ICD-10-CM | POA: Diagnosis not present

## 2019-01-20 DIAGNOSIS — J9690 Respiratory failure, unspecified, unspecified whether with hypoxia or hypercapnia: Secondary | ICD-10-CM | POA: Diagnosis not present

## 2019-01-20 DIAGNOSIS — M109 Gout, unspecified: Secondary | ICD-10-CM | POA: Diagnosis present

## 2019-01-20 HISTORY — DX: Presence of other specified devices: Z97.8

## 2019-01-20 LAB — PROTEIN / CREATININE RATIO, URINE
Creatinine, Urine: 29.08 mg/dL
Protein Creatinine Ratio: 0.34 mg/mg{Cre} — ABNORMAL HIGH (ref 0.00–0.15)
Total Protein, Urine: 10 mg/dL

## 2019-01-20 LAB — BASIC METABOLIC PANEL
Anion gap: 11 (ref 5–15)
BUN: 12 mg/dL (ref 8–23)
CO2: 33 mmol/L — ABNORMAL HIGH (ref 22–32)
Calcium: 8.9 mg/dL (ref 8.9–10.3)
Chloride: 93 mmol/L — ABNORMAL LOW (ref 98–111)
Creatinine, Ser: 0.68 mg/dL (ref 0.61–1.24)
GFR calc Af Amer: >60 mL/min (ref >60)
GFR calc non Af Amer: >60 mL/min (ref >60)
Glucose, Bld: 116 mg/dL — ABNORMAL HIGH (ref 70–99)
Potassium: 3.8 mmol/L (ref 3.5–5.1)
Sodium: 137 mmol/L (ref 135–145)

## 2019-01-20 LAB — TROPONIN I (HIGH SENSITIVITY): Troponin I (High Sensitivity): 26 ng/L — ABNORMAL HIGH (ref <18)

## 2019-01-20 LAB — PROTIME-INR
INR: 1.9 — ABNORMAL HIGH (ref 0.8–1.2)
Prothrombin Time: 21.2 seconds — ABNORMAL HIGH (ref 11.4–15.2)

## 2019-01-20 LAB — FERRITIN: Ferritin: 111 ng/mL (ref 24–336)

## 2019-01-20 LAB — TSH: TSH: 2.424 u[IU]/mL (ref 0.350–4.500)

## 2019-01-20 MED ORDER — HYDROCODONE-ACETAMINOPHEN 5-325 MG PO TABS
1.0000 | ORAL_TABLET | Freq: Once | ORAL | Status: AC
  Administered 2019-01-20: 1 via ORAL
  Filled 2019-01-20: qty 1

## 2019-01-20 MED ORDER — FUROSEMIDE 10 MG/ML IJ SOLN
40.0000 mg | Freq: Two times a day (BID) | INTRAMUSCULAR | Status: DC
  Administered 2019-01-20 – 2019-01-21 (×2): 40 mg via INTRAVENOUS
  Filled 2019-01-20 (×2): qty 4

## 2019-01-20 MED ORDER — FUROSEMIDE 10 MG/ML IJ SOLN: 40.0000 mg | Freq: Four times a day (QID) | INTRAMUSCULAR | Status: DC

## 2019-01-20 MED ORDER — FUROSEMIDE 10 MG/ML IJ SOLN
40.0000 mg | Freq: Once | INTRAMUSCULAR | Status: AC
  Administered 2019-01-20: 40 mg via INTRAVENOUS
  Filled 2019-01-20: qty 4

## 2019-01-20 MED ORDER — HYDROCODONE-ACETAMINOPHEN 5-325 MG PO TABS: 1.0000 | ORAL_TABLET | ORAL | Status: DC | PRN

## 2019-01-20 NOTE — Consult Note (Addendum)
Consultation Note Date: 01/20/2019   Patient Name: [name]  DOB: 09-07-1931  MRN: [id number]  Age / Sex: 85 y.o., male  PCP: Nicoletta Dress, MD Referring Physician: McDiarmid, Blane Ohara, MD  Reason for Consultation: Establishing goals of care  HPI/Patient Profile: 85 y.o. male  with past medical history of CHF (EF 50-55%), mod/severe mitral stenosis, s/p TAVR, atrial fibrillation, recent PE, on anticoagulation, chronic 3L oxygen, COPD, HTN, recent left forearm infection requiring surgical debridement admitted on 01/19/2019 with worsening dyspnea with pulmonary edema and recurrent bilateral pleural effusions.   Clinical Assessment and Goals of Care: I met today with [name] and no family at bedside. He says his wife was present this morning. [name] is a pleasant gentleman who has had a series of health issues since June of this year beginning with left arm infection requiring surgical debridement and hydrotherapy and he states this is healing nicely. Has since struggled with pleural effusion and had PE. We discussed that the recurrent pleural effusions thought to be causing his SOB thought to be due to his CHF and cardiac issues. We discussed hopes of using diuresis (medication) to manage this fluid and why we want to avoid thoracentesis with risks of stopping anticoagulation and risk of pneumothorax.   [name] tells me about his 20+ years with the [employer] and being stationed all over the world. His wife and children travelled with him wherever he was stationed. He has been married 50+ years and is anxious to get back to rehab so he can return home to his wife in [place] - this is his primary goal of care. He is hopeful that medication will be effective to help him diurese fluid and assist with relief of dyspnea. We discussed the risk of fluid returning and becoming a cycle of decline. We  discussed resuscitation and he tells me that he does have a Living Will. He would want resuscitation but "only if thought to be helpful" as he would not want to be kept alive on machines or "be a vegetable." He does value QOL. We did discuss why DNR may be more appropriate given his goals. He relies on his wife to help with these decisions as she is a retired Therapist, sports.   I called and spoke with his wife (with his permission). She is a retired Therapist, sports and has good understanding of his conditions. She knows he will not fully recover and that this will continue to be an issue for him. She is hopeful that medication adjustments will give him some more time and QOL. She tells me that she has sat down their children and prepared them that he is older and with more health issues and that he could decline further at anytime. They have had clear discussions as a family. At this time she requests full code but would opt for DNR with further decline. I encouraged her to consider doing this sooner than later given lack of options to reverse his condition and low likelihood of survival  in resuscitation event. She will not let him waste away in a nursing home and would rather opt for taking him home with hospice to die at home if he worsens. They both want to be cremated with no funeral. She was appreciative of the discussion.   All questions/concerns addressed. Emotional support provided. Will try and meet with wife at bedside tomorrow ~3 pm.   Primary Decision Maker PATIENT    SUMMARY OF RECOMMENDATIONS   - Continue [place] conversations  Code Status/Advance Care Planning:  Full code - encouraged consideration of DNR   Symptom Management:   Diuresis per cardiology.   Palliative Prophylaxis:   Bowel Regimen and Delirium Protocol  Psycho-social/Spiritual:   Desire for further Chaplaincy support:no  Additional Recommendations: Caregiving  Support/Resources and Education on Hospice  Prognosis:   Overall prognosis  is poor with worsening CHF symptoms.   Discharge Planning: Return to SNF rehab with palliative to follow.      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 The following aspects are pertinent.  Past Medical History:  Diagnosis Date   Acute myocardial infarction, unspecified site, initial episode of care    Anemia    Anxiety    Aortic stenosis     Atrial fibrillation 06/27/2013   Atrial fibrillation (HCC)    a. permanent, on Coumadin for anticoagulation   BPPV (benign paroxysmal positional vertigo) 2015   CAD (coronary artery disease)    a. s/p CABG in 1994 b. cath in 2015 showing normal LM, 100% LCx and RCA stenosis with 50-60% prox LAD stenosis and 100% mid-LAD stenosis; patent sequential SVG-OM2-OM3 and patent LIMA-LAD with PTCA of distal LAD via LIMA graft performed at that time   Carotid stenosis    CAROTID STENOSIS- moderate 09/09/2008   Qualifier: Diagnosis of  By: Burnett Kanaris     Cellulitis 11/15/2018   Chest pain with moderate risk of acute coronary syndrome 06/27/2013   CHF (congestive heart failure) (HCC)    Chronic anticoagulation 06/27/2013   COPD (chronic obstructive pulmonary disease) ([place])    Depression    Diabetes mellitus without complication ([place])    FASTING 98-120S   GERD (gastroesophageal reflux disease)    Gout attack- on steroid dose pack 06/27/2013   History of blood transfusion    History of peptic ulcer disease     Hyperlipidemia    Hypertension    Left atrial severely dilated 01/19/2019   Myocardial infarction ([place])    X2   NSTEMI (non-ST elevated myocardial infarction) ([place]) 08/2013   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 ([place]) 06/04/2013   PEPTIC ULCER DISEASE, HX OF 09/09/2008   Qualifier: Diagnosis of  By: Burnett Kanaris     PERIPHERAL VASCULAR DISEASE 09/09/2008   Qualifier: Diagnosis of  By: Burnett Kanaris     Peripheral vascular disease ([place])    Pneumonia    HX OF   Pressure injury of skin 12/17/2018   Renal artery stenosis (HCC)    S/P thoracentesis    Severe aortic stenosis 09/17/2017   Sleep apnea  OXYGEN AT NIGHT 2L [place]   Stress fracture of calcaneus 2018   Dr Paulla Dolly (Pod)   Tachycardia- beta blocker increased 06/26/2013   UNSPECIFIED ANEMIA 09/09/2008   Qualifier: Diagnosis of  By: Burnett Kanaris     UNSPECIFIED CEREBROVASCULAR DISEASE 09/09/2008   Qualifier: Diagnosis of  By: Burnett Kanaris     Unstable angina ([place]) 08/31/2013   Vertigo 06/27/2013   Social History   Socioeconomic History   Marital status: Married    Spouse name: Not on file   Number of children: Not on file   Years of education: Not on file   Highest education level: Not on file  Occupational History   Not on file  Social Needs   Financial resource strain: Not on file   Food insecurity    Worry: Not on file    Inability: Not on file   Transportation needs    Medical: Not on file    Non-medical: Not on file  Tobacco Use   Smoking status: Former Smoker    Quit date: 04/27/1993    Years since quitting: 25.7   Smokeless tobacco: Never Used  Substance and Sexual Activity   Alcohol use: No    Alcohol/week: 0.0 standard drinks   Drug use: No   Sexual activity: Yes  Lifestyle   Physical activity    Days per week: Not on file    Minutes per session: Not on file   Stress: Not on file  Relationships   Social  connections    Talks on phone: Not on file    Gets together: Not on file    Attends religious service: Not on file    Active member of club or organization: Not on file    Attends meetings of clubs or organizations: Not on file    Relationship status: Not on file  Other Topics Concern   Not on file  Social History Narrative   Social History:   Married    Tobacco Use - No.    Alcohol Use - yes         Family History:   Mother died at age 4 of coronary disease.  Father died at 22 of coronary disease.  He has 13 siblings, almost all of whom have coronary disease and some with premature onset.   Family History  Problem Relation Age of Onset   Coronary artery disease Mother    Heart disease Mother        Before age 75   Hyperlipidemia Mother    Hypertension Mother    Heart attack Mother    Coronary artery disease Father    Heart disease Father        After age 61   Heart attack Father    Heart disease Sister        After age 98   Heart disease Brother        After age 86   Coronary artery disease Other        65 sibling, almost all have coronary disease and some with premature onset   Heart disease Other        31 sibling, almost all have coronary disease and some with premature onset   Scheduled Meds:  allopurinol  300 mg Oral Daily   apixaban  5 mg Oral BID   atorvastatin  80 mg Oral QHS   brimonidine  1 drop Both Eyes BID   ferrous sulfate  325 mg Oral Q breakfast   furosemide  40 mg Intravenous Once   Melatonin  6 mg Oral QHS   metoprolol tartrate  25 mg Oral BID   multivitamin with minerals  1 tablet Oral Daily   pantoprazole  40 mg Oral Daily   spironolactone  25 mg Oral Daily   tamsulosin  0.4 mg Oral QPM   umeclidinium bromide  1 puff Inhalation Daily   Continuous Infusions: PRN Meds:.acetaminophen **OR** acetaminophen, levalbuterol Allergies  Allergen Reactions   Bactrim [Sulfamethoxazole-Trimethoprim] Other (See Comments)     "Allergic," per MAR   Levaquin [Levofloxacin In D5w] Diarrhea and Other (See Comments)    "Allergic," per MAR   Review of Systems  Constitutional: Positive for activity change, appetite change and fatigue.  Respiratory: Positive for shortness of breath.   Neurological: Positive for weakness.    Physical Exam Vitals signs and nursing note reviewed.  Constitutional:      General: He is not in acute distress.    Appearance: He is ill-appearing.     Comments: Elderly, frail   Cardiovascular:     Rate and Rhythm: Normal rate.  Pulmonary:     Effort: No tachypnea or accessory muscle usage.     Comments: Purse lipped breathing; mod SOB at rest Abdominal:     General: Abdomen is flat.  Neurological:     Mental Status: He is alert and oriented to person, place, and time.     Vital Signs: BP (!) 100/58 (BP Location: Right Arm)    Pulse 83    Temp (!) 97.5 F (36.4 C) (Oral)    Resp 17    Ht 6' (1.829 m)    Wt 79.6 kg    SpO2 100%    BMI 23.80 kg/m  Pain Scale: 0-10   Pain Score: 0-No pain   SpO2: SpO2: 100 % O2 Device:SpO2: 100 % O2 Flow Rate: .O2 Flow Rate (L/min): 3 L/min  IO: Intake/output summary:   Intake/Output Summary (Last 24 hours) at 01/20/2019 1305 Last data filed at 01/20/2019 1015 Gross per 24 hour  Intake 360 ml  Output 3170 ml  Net -2810 ml    LBM:   Baseline Weight: Weight: 81.3 kg Most recent weight: Weight: 79.6 kg     Palliative Assessment/Data:     Time In/Out: 1300-1340, 1550-1620 Time Total: 70 min Greater than 50%  of this time was spent counseling and coordinating care related to the above assessment and plan.  Signed by: Vinie Sill, NP Palliative Medicine Team Pager # [phone] (M-F 8a-5p) Team Phone # [phone] (Nights/Weekends)

## 2019-01-20 NOTE — Plan of Care (Signed)

## 2019-01-20 NOTE — Progress Notes (Addendum)
NAME:  [name], MRN:  [id number], DOB:  1931-07-05, LOS: 0 ADMISSION DATE:  01/19/2019, CONSULTATION DATE:  01/19/2019 REFERRING MD:  McDiarmid - [facility], CHIEF COMPLAINT:  SOB   Brief History   85 yo M  Presented to ED with from [facility] with CC SOB. Recently admitted with dyspnea, bilateral pleural effusions s/p bilateral thoracentesis 7/30, 7/31.   History of present illness   85 yo M extensive PMH including COPD on 2-4L [place], CHF s/p TAVR, CAD sp CABG, Afib on eliquis, recent PE who presented to ED 8/17 with worsening SOB. Patient states this has been worsening since discharge. He states "Dr. Tamala Julian was so nice before, he drained both my lungs," in reference to recent hospitalization (7/29 admission- 8/04 discharge) during which PCCM performed bilateral thoracentesis for the patient due to pleural effusions. Patient was discharged to [facility].  Reportedly, coordinated efforts have been made for repeat thoracentesis later this week however the patients dyspnea has prompted ED presentation today.   In ED, patient SpO2 100% on 5LNC. His home regimen is 2-4L, we weaned his [place] to 2L, SpO2 remained 100%.  In ED patient started on vanc and zosyn. Patient WBC 8.0, patient is afebrile.  BNP 222.4 Alk Phos 165, AST 33 ALT 30  Coagulation labs have not been sent.   Past Medical History  CAD, s/p CABG MI Atrial Fibrillation on eliquis Aortic stenosis Carotid stenosis  CHF COPD on 2-4LNC GERD PUD Renal artery stenosis Sleep Apnea HTN HLD DM Depression  Significant Hospital Events   8/17 presents to ED from [facility] for dyspnea. Admitted to [facility]. PCCM consulted for dyspnea and small bilateral pleural effusions   Consults:  PCCM  Procedures:    Significant Diagnostic Tests:  CXR 8/17> LLL atelectasis. Bilateral pleural effusion. Cardiomegaly. Pulmonary vascular congestion. Possible interstitial edema   Micro Data:  SARS Cov2> negative 8/17  Antimicrobials:  Vanc 8/17  >> Zosyn 8/17>>  Interim history/subjective:  PCCM sees patient in ED. Patient is mildly dyspneic in ED. He is on [facility] however SpO2 is 100%. PCCM decreases O2 to 2LNC. (Home 2-4LNC)   Objective   Blood pressure 133/62, pulse 98, temperature 98.1 F (36.7 C), temperature source Oral, resp. rate 19, height 6' (1.829 m), weight 81.3 kg, SpO2 100 %.        Intake/Output Summary (Last 24 hours) at 01/19/2019 1721 Last data filed at 01/19/2019 1651    Gross per 24 hour  Intake -  Output 720 ml  Net -720 ml      Filed Weights   01/19/19 1402  Weight: 81.3 kg    Examination: General: Elderly 85 year old male who reports not breathing as well  HEENT: No JVD or lymphadenopathy is appreciated Neuro: Grossly intact CV: Heart sounds are distant PULM: Diminished breath sounds in the bases GI: soft, bsx4 active  Extremities: warm/dry, 2+ edema  Skin: no rashes or lesions    Resolved Hospital Problem list   None  Chest x-ray reviewed small bilateral pleural effusions atelectasis noted  Assessment & Plan:  85 year old male with PMH of CHF, recent PE and COPD history who presents to PCCM with SOB.  Patient was seen on 7/30 for pleural effusion and bilateral thoracentesis were done with clear fluid removed.  On exam, diminished BS diffusely.  Discussed with PCCM-NP.  Pleural effusion: appeared transudative last time after discussing it with the operator.  Unfortunately no fluid analysis noted.  Likely due to heart failure to speed of  recurrence.  Patient is on NOAC for PE and a-fib. He is -1200 cc for 24 hours but still complains of being short of breath Continue diuresis which will be more helpful than thoracentesis at this time. Continue diuresis Monitor chest x-ray Continue to monitor if a if refractory to diuresis will then consider thoracentesis Repeated thoracentesis for congestive heart failure is not optimal treatment.  Hypoxemia: baseline O2 of 3L at rest only requiring  2 Advised to as needed to keep sats greater than 88% Continue diuresis as able  COPD: no sign of exacerbation Continue bronchodilators Continue home oxygen at 3 L  ?HCAP: WBC is normal.   Procalcitonin noted to be less than 1.1 Stop antimicrobial therapy on 01/20/2019  Pulmonary edema: Continue diuresis noted to be 1200 cc -01/20/2019  Atelectasis: Continue pulmonary toilet as able Mobilize    Labs   CBC: Recent Labs  Lab 01/19/19 1408  WBC 8.0  NEUTROABS 5.5  HGB 9.8*  HCT 32.0*  MCV 87.9  PLT 421*    Basic Metabolic Panel: Recent Labs  Lab 01/19/19 1408 01/20/19 0023  NA 137 137  K 4.0 3.8  CL 94* 93*  CO2 31 33*  GLUCOSE 134* 116*  BUN 13 12  CREATININE 0.64 0.68  CALCIUM 8.8* 8.9   GFR: Estimated Creatinine Clearance: 74.1 mL/min (by C-G formula based on SCr of 0.68 mg/dL). Recent Labs  Lab 01/19/19 1408 01/19/19 1840  PROCALCITON  --  <0.10  WBC 8.0  --     Liver Function Tests: Recent Labs  Lab 01/19/19 1408  AST 33  ALT 30  ALKPHOS 165*  BILITOT 0.7  PROT 5.8*  ALBUMIN 2.6*   No results for input(s): LIPASE, AMYLASE in the last 168 hours. No results for input(s): AMMONIA in the last 168 hours.  ABG    Component Value Date/Time   PHART 7.567 (H) 01/01/2019 0740   PCO2ART 31.8 (L) 01/01/2019 0740   PO2ART 133 (H) 01/01/2019 0740   HCO3 29.3 (H) 01/01/2019 0740   TCO2 33 (H) 12/17/2018 1128   ACIDBASEDEF TEST WILL BE CREDITED 09/13/2017 1049   O2SAT 99.6 01/01/2019 0740     Coagulation Profile: Recent Labs  Lab 01/20/19 0023  INR 1.9*    Cardiac Enzymes: No results for input(s): CKTOTAL, CKMB, CKMBINDEX, TROPONINI in the last 168 hours.  HbA1C: Hgb A1c MFr Bld  Date/Time Value Ref Range Status  12/17/2018 04:23 PM 6.1 (H) 4.8 - 5.6 % Final    Comment:    (NOTE) Pre diabetes:          5.7%-6.4% Diabetes:              >6.4% Glycemic control for   <7.0% adults with diabetes   09/13/2017 10:50 AM 6.1 (H) 4.8 - 5.6 %  Final    Comment:    (NOTE) Pre diabetes:          5.7%-6.4% Diabetes:              >6.4% Glycemic control for   <7.0% adults with diabetes     CBG: No results for input(s): GLUCAP in the last 168 hours.   Richardson Landry Minor ACNP Maryanna Shape PCCM Pager [phone] till 1 pm If no answer page [phone] 01/20/2019, 10:43 AM  Attending Note:  85 year old male with PMH of CHF, pulmonary edema and bilateral pleural effusion who presents to [facility] on consultation for thoracentesis.  The patient diuresed well overnight, O2 demand is stable but continues  to report subjective SOB.  Bibasilar crackles noted.  I reviewed CXR myself, persistent pulmonary edema.  PCT <0.1, no fever and WBC stable.  Discussed with PCCM-NP.  Pleural effusion:  - No need for thora for now  - Continue diureses BUN:Cr ratio is 17  - Treat heart failure  - Repeated thora will carry more risk than benefit with stopping anti-coagulation and with PTX risk  Pulmonary edema:  - Recommend consultation with the heart failure team or cardiology  - Strict I/O  - Lasix 40 mg IV BID x2 doses  HCAP: highly doubtful  - Recommend stopping abx at this time  - F/U on cultures  Hypoxemia:  - Titrate O2 for sat of 88-92%  - Patient is at home dose of O2  SOB: likely a combination of pulmonary edema, COPD (this is not an exacerbation) and pulmonary HTN  - Recommend a cardiology consult  PCCM will re-evaluate again in AM with CXR and I/O measurement but cardiology consult with a more detailed echo is recommended.  Patient seen and examined, agree with above note.  I dictated the care and orders written for this patient under my direction.  Rush Farmer, [place]

## 2019-01-20 NOTE — Consult Note (Addendum)
Cardiology Consultation:   Patient ID: [name] MRN: [id number]; DOB: 06-24-1931  Admit date: 01/19/2019 Date of Consult: 01/20/2019  Primary Care Provider: Nicoletta Dress, MD Primary Cardiologist: No primary care provider on file.  Primary Electrophysiologist:  None    Patient Profile:   [name] is a 85 y.o. male with a hx of CAD s/p CABG, permanent Afib, AS s/p TAVR, PVD, pleural effusions s/p multiple thoracentesis and COPD on home 02 who is being seen today for the evaluation of dyspnea and mitral stenosis at the request of Dr. McDiarmid.  History of Present Illness:   [name] is an 85 yo male with PMH noted above.  He is followed by Dr. Stanford Breed as an outpatient.  He had a right carotid endarterectomy in August 2014. He has permanent atrial fibrillation which is managed with beta-blocker and Pradaxa.  Echocardiogram in January 2019 noted severe aortic stenosis.  He underwent cardiac catheterization shortly thereafter that showed occluded native vessels with SVG to first OM and second OM patent as well as LIMA to the LAD.  There was moderate stenosis of the apical LAD.  He was referred to the structural heart team and ultimately underwent TAVR in April 2019.  Follow-up echocardiogram 1 month later showed normal LV function with bioprosthetic aortic valve with mean gradient of 14 mmHg with mild to moderate mitral stenosis and mild mitral regurg.  He had a preprocedure CTA in March 2019 that showed a possible enhancing gastric mass and EGD was recommended.  Did undergo EGD with no mass noted.   He was seen through a telehealth visit on 09/09/2018 with Nell Range for his TAVR follow-up.  At this visit he reported doing well overall with no complaints.  He had an echocardiogram on 11/24/2018 which showed normal EF with elevated left atrial pressure, severely dilated left atrium and moderately dilated right atrium.  Noted to have moderate to severe mitral valve  stenosis.  He was admitted on 12/31/2018 with worsening shortness of breath felt to be multifactorial secondary to COPD and CHF.  During this admission he required bilateral thoracentesis with 1200cc of fluid removed. Echo that admission showed low normal EF, stable TAVR with moderate to severe MS, mean gradient of 53mmHg. Appears he was discharged on 80mg  lasix daily, but per report was taking 80mg  BID prior to admission. He was discharged to [facility] for rehab.   Presented back to the ED from [facility] after worsening shortness of breath for 3 days prior to admission. Not much LE edema but did have some abd fullness and orthopnea. He is maintained on [place] 2-4L on a normal basis, but was requiring increased 02 of 5L. In the ED his labs showed stable electrolytes, Albumin 2.4, BNP 222, HsT 26, Hgb 9.8. CXR with LLL atelectasis, and small bilateral pleural effusions. He was admitted and given IV lasix 40mg  for several doses. Net -2.8L with weight down 3Lbs. Does report some improvement in his breathing but is still dyspneic with simple conversation.    Heart Pathway Score:     Past Medical History:  Diagnosis Date   Acute myocardial infarction, unspecified site, initial episode of care    Anemia    Anxiety    Aortic stenosis     Atrial fibrillation 06/27/2013   Atrial fibrillation (HCC)    a. permanent, on Coumadin for anticoagulation   BPPV (benign paroxysmal positional vertigo) 2015   CAD (coronary artery disease)    a. s/p CABG in 1994 b.  cath in 2015 showing normal LM, 100% LCx and RCA stenosis with 50-60% prox LAD stenosis and 100% mid-LAD stenosis; patent sequential SVG-OM2-OM3 and patent LIMA-LAD with PTCA of distal LAD via LIMA graft performed at that time   Carotid stenosis    CAROTID STENOSIS- moderate 09/09/2008   Qualifier: Diagnosis of  By: Burnett Kanaris     Cellulitis 11/15/2018   Chest pain with moderate risk of acute coronary syndrome 06/27/2013   CHF (congestive heart  failure) (HCC)    Chronic anticoagulation 06/27/2013   COPD (chronic obstructive pulmonary disease) ([place])    Depression    Diabetes mellitus without complication ([place])    FASTING 98-120S   GERD (gastroesophageal reflux disease)    Gout attack- on steroid dose pack 06/27/2013   History of blood transfusion    History of peptic ulcer disease    Hyperlipidemia    Hypertension    Left atrial severely dilated 01/19/2019   Myocardial infarction ([place])    X2   NSTEMI (non-ST elevated myocardial infarction) ([place]) 08/2013   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 ([place]) 06/04/2013   PEPTIC ULCER DISEASE, HX OF 09/09/2008   Qualifier: Diagnosis of  By: Burnett Kanaris     PERIPHERAL VASCULAR DISEASE 09/09/2008   Qualifier: Diagnosis of  By: Burnett Kanaris     Peripheral vascular disease ([place])    Pneumonia    HX OF   Pressure injury of skin 12/17/2018   Renal artery stenosis (HCC)    S/P thoracentesis    Severe aortic stenosis 09/17/2017   Sleep apnea    OXYGEN AT NIGHT 2L [place]   Stress fracture of calcaneus 2018   Dr Paulla Dolly (Pod)   Tachycardia- beta blocker increased 06/26/2013   UNSPECIFIED ANEMIA 09/09/2008   Qualifier: Diagnosis of  By: Burnett Kanaris     UNSPECIFIED CEREBROVASCULAR DISEASE 09/09/2008   Qualifier: Diagnosis of  By: Burnett Kanaris     Unstable angina ([place]) 08/31/2013   Vertigo 06/27/2013    Past Surgical History:  Procedure Laterality Date   AORTIC ARCH ANGIOGRAPHY N/A 08/14/2017   Procedure: AORTIC ARCH ANGIOGRAPHY;  Surgeon: Sherren Mocha, MD;  Location: [facility];  Service: Cardiovascular;  Laterality: N/A;   BOWEL RESECTION  07/2013   Bowel perforation   CARDIAC CATHETERIZATION     CAROTID ENDARTERECTOMY Right 2014   Curt Jews, MD   COLON SURGERY     CORONARY ARTERY BYPASS GRAFT  1994   ENDARTERECTOMY Right 01/05/2013   Procedure: ENDARTERECTOMY CAROTID;  Surgeon: Rosetta Posner, MD;  Location: [facility];  Service: Vascular;  Laterality: Right;   INCISION AND DRAINAGE Left 11/16/2018   Procedure: INCISION AND DRAINAGE LEFT FOREARM/ARM;  Surgeon: Leanora Cover, MD;  Location: [facility];  Service: Orthopedics;  Laterality: Left;   KNEE SURGERY  08/2003   left knee/ARTHROSCOPIC   LEFT HEART CATHETERIZATION WITH CORONARY/GRAFT ANGIOGRAM N/A 09/01/2013   Procedure: LEFT HEART CATHETERIZATION WITH Beatrix Fetters;  Surgeon: Sinclair Grooms, MD;  [facility];  Service: Cardiovascular;  Laterality: N/A;   PATCH ANGIOPLASTY Right 01/05/2013   Procedure: PATCH ANGIOPLASTY;  Surgeon: Rosetta Posner, MD;  Location: [place];  Service: Vascular;  Laterality: Right;   PERCUTANEOUS CORONARY STENT INTERVENTION (PCI-S)  09/01/2013   Procedure: PERCUTANEOUS CORONARY STENT INTERVENTION (PCI-S);  Surgeon: Sinclair Grooms, MD;  Location: [facility] CATH LAB;  Service: Cardiovascular;;   removal of bleeding ulcer  1994   RENAL ARTERY  STENT     stenting of the left renal artery as well as a cutting balloon angioplasty for treatment of in-stent restenosis coronary artery bypass grafting in 1994.   RIGHT/LEFT HEART CATH AND CORONARY/GRAFT ANGIOGRAPHY N/A 08/14/2017   Procedure: RIGHT/LEFT HEART CATH AND CORONARY/GRAFT ANGIOGRAPHY;  Surgeon: Sherren Mocha, MD;  Location: [facility];  Service: Cardiovascular;  Laterality: N/A;   TEE WITHOUT CARDIOVERSION N/A 09/17/2017   Procedure: TRANSESOPHAGEAL ECHOCARDIOGRAM (TEE);  Surgeon: Sherren Mocha, MD;  Location: [place];  Service: Open Heart Surgery;  Laterality: N/A;     Home Medications:  Prior to Admission medications   Medication Sig Start Date End Date Taking? Authorizing Provider  allopurinol (ZYLOPRIM) 300 MG tablet Take 300 mg by mouth daily. 10/12/18  Yes [provider]  Amino Acids-Protein Hydrolys (PRO-STAT) LIQD Take 30 mLs by mouth 2 (two) times a day.   Yes [provider]  apixaban (ELIQUIS) 5  MG TABS tablet Take 1 tablet (5 mg total) by mouth 2 (two) times daily. 12/22/18 01/21/19 Yes Lurline Del, MD  atorvastatin (LIPITOR) 80 MG tablet Take 1 tablet (80 mg total) by mouth at bedtime. 12/07/14  Yes Lelon Perla, MD  benazepril (LOTENSIN) 5 MG tablet Take 1 tablet (5 mg total) by mouth daily. 12/23/18  Yes Lurline Del, MD  bisacodyl (DULCOLAX) 5 MG EC tablet Take 1 tablet (5 mg total) by mouth daily. 12/23/18  Yes Lurline Del, MD  brimonidine (ALPHAGAN) 0.2 % ophthalmic solution Place 1 drop into both eyes 2 (two) times daily.   Yes [provider]  budesonide-formoterol (SYMBICORT) 80-4.5 MCG/ACT inhaler Inhale 1 puff into the lungs daily as needed ("for shortness of breath or wheezing- rinse mouth afterwards").  09/05/18  Yes [provider]  ferrous sulfate 325 (65 FE) MG tablet Take 325 mg by mouth daily with breakfast.    Yes [provider]  furosemide (LASIX) 80 MG tablet Take 1 tablet (80 mg total) by mouth daily. Patient taking differently: Take 80 mg by mouth 2 (two) times daily.  01/06/19  Yes Wilber Oliphant, MD  HYDROcodone-acetaminophen (NORCO/VICODIN) 5-325 MG tablet Take 1-2 tablets by mouth every 4 (four) hours as needed for moderate pain or severe pain. 01/06/19  Yes Lattie Haw, MD  insulin aspart (NOVOLOG FLEXPEN) 100 UNIT/ML FlexPen Inject 2 Units into the skin See admin instructions. Inject 2 units into the skin at 9 AM in the morning for a BGL >200   Yes [provider]  lansoprazole (PREVACID) 30 MG capsule Take 30 mg by mouth daily before breakfast.    Yes [provider]  levalbuterol (XOPENEX) 0.63 MG/3ML nebulizer solution Take 3 mLs (0.63 mg total) by nebulization every 6 (six) hours as needed for wheezing or shortness of breath. Patient taking differently: Take 0.63 mg by nebulization 3 (three) times daily.  12/22/18  Yes Lurline Del, MD  Melatonin 5 MG TABS Take 5 mg by mouth at bedtime.   Yes [provider]  metFORMIN (GLUCOPHAGE-XR) 500 MG 24 hr tablet Take 500 mg by mouth daily. 12/10/18  Yes [provider]  metoprolol tartrate (LOPRESSOR) 25 MG tablet Take 1 tablet (25 mg total) by mouth 2 (two) times daily. Patient taking differently: Take 25 mg by mouth daily.  01/06/19  Yes Lattie Haw, MD  Multiple Vitamin (MULTIVITAMIN WITH MINERALS) TABS Take 1 tablet by mouth daily.   Yes [provider]  NON FORMULARY Take 120 mLs by mouth See admin instructions.  MedPass: Drink 120 ml's by mouth two times a day   Yes [provider]  OXYGEN Inhale 3 L/min into the lungs continuous. TO MAINTAIN SATS <90%   Yes [provider]  polyethylene glycol (MIRALAX / GLYCOLAX) 17 g packet Take 17 g by mouth 2 (two) times daily. Patient taking differently: Take 17 g by mouth daily. MIXED IN LIQUID DRINK 12/22/18  Yes Lurline Del, MD  spironolactone (ALDACTONE) 25 MG tablet Take 25 mg by mouth daily.   Yes [provider]  tamsulosin (FLOMAX) 0.4 MG CAPS capsule Take 1 capsule (0.4 mg total) by mouth daily after supper. Patient taking differently: Take 0.4 mg by mouth every evening.  12/22/18  Yes Lurline Del, MD  trolamine salicylate (ASPERCREME) 10 % cream Apply 1 application topically every 6 (six) hours as needed for muscle pain.    Yes [provider]  umeclidinium bromide (INCRUSE ELLIPTA) 62.5 MCG/INH AEPB Inhale 1 puff into the lungs daily. 11/27/18  Yes Matilde Haymaker, MD  Gauze Pads & Dressings (FOAM DRESSING) PADS Apply 1 patch topically See admin instructions. Apply a foam dressing to sacrum every day AND as needed for redness    [provider]    Inpatient Medications: Scheduled Meds:  allopurinol  300 mg Oral Daily   apixaban  5 mg Oral BID   atorvastatin  80 mg Oral QHS   brimonidine  1 drop Both Eyes BID   ferrous sulfate  325 mg Oral Q breakfast   Melatonin  6 mg Oral QHS   metoprolol tartrate  25 mg Oral BID   multivitamin  with minerals  1 tablet Oral Daily   pantoprazole  40 mg Oral Daily   spironolactone  25 mg Oral Daily   tamsulosin  0.4 mg Oral QPM   umeclidinium bromide  1 puff Inhalation Daily   Continuous Infusions:  PRN Meds: acetaminophen **OR** acetaminophen, levalbuterol  Allergies:    Allergies  Allergen Reactions   Bactrim [Sulfamethoxazole-Trimethoprim] Other (See Comments)    "Allergic," per [facility]   Levaquin [Levofloxacin In D5w] Diarrhea and Other (See Comments)    "Allergic," per [facility]    Social History:   Social History   Socioeconomic History   Marital status: Married    Spouse name: Not on file   Number of children: Not on file   Years of education: Not on file   Highest education level: Not on file  Occupational History   Not on file  Social Needs   Financial resource strain: Not on file   Food insecurity    Worry: Not on file    Inability: Not on file   Transportation needs    Medical: Not on file    Non-medical: Not on file  Tobacco Use   Smoking status: Former Smoker    Quit date: 04/27/1993    Years since quitting: 25.7   Smokeless tobacco: Never Used  Substance and Sexual Activity   Alcohol use: No    Alcohol/week: 0.0 standard drinks   Drug use: No   Sexual activity: Yes  Lifestyle   Physical activity    Days per week: Not on file    Minutes per session: Not on file   Stress: Not on file  Relationships   Social connections    Talks on phone: Not on file    Gets together: Not on file    Attends religious service: Not on file    Active member of club or organization: Not on  file    Attends meetings of clubs or organizations: Not on file    Relationship status: Not on file   Intimate partner violence    Fear of current or ex partner: Not on file    Emotionally abused: Not on file    Physically abused: Not on file    Forced sexual activity: Not on file  Other Topics Concern   Not on file  Social History Narrative   Social  History:   Married    Tobacco Use - No.    Alcohol Use - yes         Family History:   Mother died at age 34 of coronary disease.  Father died at 23 of coronary disease.  He has 13 siblings, almost all of whom have coronary disease and some with premature onset.    Family History:    Family History  Problem Relation Age of Onset   Coronary artery disease Mother    Heart disease Mother        Before age 7   Hyperlipidemia Mother    Hypertension Mother    Heart attack Mother    Coronary artery disease Father    Heart disease Father        After age 83   Heart attack Father    Heart disease Sister        After age 65   Heart disease Brother        After age 23   Coronary artery disease Other        47 sibling, almost all have coronary disease and some with premature onset   Heart disease Other        13 sibling, almost all have coronary disease and some with premature onset     ROS:  Please see the history of present illness.   All other ROS reviewed and negative.     Physical Exam/Data:   Vitals:   01/20/19 0708 01/20/19 0733 01/20/19 0903 01/20/19 1210  BP:  (!) 107/53  (!) 100/58  Pulse:  92  83  Resp:  19  17  Temp:  (!) 97.4 F (36.3 C)  (!) 97.5 F (36.4 C)  TempSrc:  Oral  Oral  SpO2: 99% 100% 98% 100%  Weight:      Height:        Intake/Output Summary (Last 24 hours) at 01/20/2019 1420 Last data filed at 01/20/2019 1015 Gross per 24 hour  Intake 360 ml  Output 3170 ml  Net -2810 ml   Last 3 Weights 01/20/2019 01/19/2019 01/19/2019  Weight (lbs) 175 lb 7.8 oz 176 lb 12.9 oz 179 lb 3.7 oz  Weight (kg) 79.6 kg 80.2 kg 81.3 kg     Body mass index is 23.8 kg/m.  General:  Pleasant, frail older WM, mildly dyspneic at rest. Wearing [place]. HEENT: normal Lymph: no adenopathy Neck: + JVD Vascular: No carotid bruits Cardiac:  normal S1, S2; Irreg Irreg; + systolic murmur Lungs:  Diminished in bases bilaterally Abd: soft, nontender, no  hepatomegaly  Ext: no edema Musculoskeletal:  No deformities, BUE and BLE strength normal and equal Skin: warm and dry  Neuro:  CNs 2-12 intact, no focal abnormalities noted Psych:  Normal affect   EKG:  The EKG was personally reviewed and demonstrates:  None tracing this admission Telemetry:  Telemetry was personally reviewed and demonstrates:  Afib rate controlled.   Relevant CV Studies:  TTE: 01/01/19  IMPRESSIONS    1. The  left ventricle has low normal systolic function, with an ejection fraction of 50-55%. The cavity size was normal. There is mildly increased left ventricular wall thickness. Left ventricular diastolic Doppler parameters are indeterminate. There is  abnormal septal motion consistent with post-operative status.  2. The right ventricle has mildly reduced systolic function. The cavity was normal. There is no increase in right ventricular wall thickness. Right ventricular systolic pressure is moderately elevated with an estimated pressure of 55.2 mmHg.  3. Left atrial size was severely dilated.  4. A 26 Edwards Sapien bioprosthetic aortic valve (TAVR) valve is present in the aortic position. Procedure Date: 09/17/17 Echo findings shows no evidence of rocking, dehiscence. Mean systolic gradient 11 mmHg, calculated valve area approximately 2 cm2  using LVOT diameter of 2.6 cm and LVOT TVI 14 cm. Likely trivial prosthetic aortic valve regurgitation, though challenging to assess whether this is prosthetic or paravalvular due to image quality.  5. The mitral valve is abnormal. There is moderate mitral annular calcification present. Mitral valve regurgitation is mild to moderate by color flow Doppler. Moderate-severe mitral valve stenosis. Mean diastolic gradient 13 mmHg at HR 100-105.  6. Tricuspid valve regurgitation is moderate.  7. When compared to the prior study: 11/24/2018 - no significant change in LV function. No change in degree of mitral valve stenosis. Stable function  of aortic prosthesis. Side by side comparison of images performed.  Laboratory Data:  High Sensitivity Troponin:   Recent Labs  Lab 12/31/18 1635 01/01/19 0128 01/01/19 0344 01/01/19 0605 01/20/19 0023  TROPONINIHS 34* 27* 26* 22* 26*     Cardiac EnzymesNo results for input(s): TROPONINI in the last 168 hours. No results for input(s): TROPIPOC in the last 168 hours.  Chemistry Recent Labs  Lab 01/19/19 1408 01/20/19 0023  NA 137 137  K 4.0 3.8  CL 94* 93*  CO2 31 33*  GLUCOSE 134* 116*  BUN 13 12  CREATININE 0.64 0.68  CALCIUM 8.8* 8.9  GFRNONAA >60 >60  GFRAA >60 >60  ANIONGAP 12 11    Recent Labs  Lab 01/19/19 1408  PROT 5.8*  ALBUMIN 2.6*  AST 33  ALT 30  ALKPHOS 165*  BILITOT 0.7   Hematology Recent Labs  Lab 01/19/19 1408  WBC 8.0  RBC 3.64*  HGB 9.8*  HCT 32.0*  MCV 87.9  MCH 26.9  MCHC 30.6  RDW 15.9*  PLT 421*   BNP Recent Labs  Lab 01/19/19 1408  BNP 222.4*    DDimer No results for input(s): DDIMER in the last 168 hours.   Radiology/Studies:  Dg Chest Port 1 View  Result Date: 01/20/2019 CLINICAL DATA:  Follow-up pleural effusion EXAM: PORTABLE CHEST 1 VIEW COMPARISON:  01/19/2019 FINDINGS: Cardiac shadow remains enlarged. Changes of prior surgery and prior TAVR are again noted and stable. The lungs are well aerated with persistent left basilar consolidation. Small pleural effusions are again seen and stable. The vascular congestion has improved somewhat in the interval. Mild interstitial edema remains. No bony abnormality is noted. IMPRESSION: Mild edema and bilateral pleural effusions. The remainder of the exam is stable. Electronically Signed   By: Inez Catalina M.D.   On: 01/20/2019 07:26   Dg Chest Port 1 View  Result Date: 01/19/2019 CLINICAL DATA:  85 year old with shortness of breath EXAM: PORTABLE CHEST 1 VIEW COMPARISON:  01/02/2019 and prior radiographs FINDINGS: Cardiomegaly, pulmonary vascular congestion and mild  interstitial opacities noted. Small pleural effusions are present. LEFT LOWER lung atelectasis/airspace disease identified. There is  no evidence of pneumothorax. IMPRESSION: Cardiomegaly with pulmonary vascular congestion, small bilateral pleural effusions and possible mild interstitial edema. LEFT LOWER lung atelectasis/airspace disease. Electronically Signed   By: Margarette Canada M.D.   On: 01/19/2019 14:58    Assessment and Plan:   [name] is a 85 y.o. male with a hx of CAD s/p CABG, permanent Afib, AS s/p TAVR, PVD, pleural effusions s/p multiple thoracentesis and COPD on home 02 who is being seen today for the evaluation of dyspnea and mitral stenosis at the request of Dr. McDiarmid.  1. Dyspnea/acute on chronic diastolic HF: suspect multifactorial in nature with his underlying COPD, CHF and recurrent pleural effusions. Required bilateral thora during previous admission, but seems to have had better response to IV lasix currently. Net - 2.8L, and weight is trending down. Was taking 80mg  BID prior to admission, and received several doses of IV lasix 40mg . His blood pressures have soft, limiting aggressive diuresis.  -- will continue with IV lasix, increase to 40mg  BID while monitoring his bp. Albumin is 2.6.   2. Permanent Afib: rates overall controlled. Will continue with current dose of metoprolol to maintain rate control. May consider digoxin.  -- Currently on Eliquis 5mg  BID.   3. Pleural effusions: small bilateral effusions noted on CXR. PCCM evaluated but prefer to diuresis, and hold on repeat thora for now.   4. CAD s/p CABG: HsT mildly elevated at 26 on admission. Suspect demand ischemia in the setting of CHF.   5. AS s/p TAVR: recent echo in July with stable valve position. Mean gradient 44mmHg.  6. Moderate/severe MS: 32mmHg 7/20. Could be contributing to HF symptoms.  For questions or updates, please contact [place] Please consult [email or web address] for contact info under       Signed, Reino Bellis, NP  01/20/2019 2:20 PM   As above, patient seen and examined.  Briefly he is an 85 year old male with past medical history of coronary artery disease status post coronary artery bypass graft, prior TAVR for aortic stenosis, permanent atrial fibrillation, mitral stenosis, COPD for evaluation of acute on chronic diastolic congestive heart failure.  Patient had TAVR April 2019.  Last echocardiogram July 2020 showed normal LV function, severe left atrial enlargement, prior TAVR with mean gradient 11 mmHg, trivial aortic insufficiency, moderate to severe mitral stenosis with mean gradient 13 mmHg and mild to moderate mitral regurgitation, moderate tricuspid regurgitation.  Patient recently discharged following admission for CHF.  Had bilateral thoracenteses at that time but fluid was not sent for studies by report.  Since discharge he notes progressive dyspnea on exertion, orthopnea and increased abdominal girth.  No lower extremity edema.  He denies chest pain, fevers, chills or chest pain.  Some dizziness with standing.  Admitted for CHF and cardiology asked to evaluate.  Note patient had a CT scan June 2020 that showed possible small pulmonary embolus right lower lobe.  Chest x-ray shows pulmonary edema and small bilateral effusions.  BUN 12 and creatinine 0.68.  BNP 222.  Hemoglobin 9.8.  1 acute on chronic diastolic congestive heart failure-patient with pulmonary edema on chest x-ray.  Possible contribution from mitral stenosis.  We will begin with Lasix 40 mg IV twice daily.  We may be limited by blood pressure.  Diurese as tolerated and follow symptoms.  Needs fluid restriction and low-sodium diet.  Follow renal function closely.  Note his albumin is 2.6 which may be contributing via low oncotic pressure.  2 mitral valve disease-moderate to severe  mitral stenosis and mild to moderate mitral regurgitation.  Patient would not be a candidate for mitral valve surgery.  3  permanent atrial fibrillation-we will continue with metoprolol for rate control.  If blood pressure becomes an issue it might be worth trying to control weight with digoxin and decrease metoprolol.  Continue apixaban 5 mg twice daily. CHADSvasc 6.  4 status post TAVR-last echocardiogram showed normally functioning valve.  5 COPD-continue pulmonary toilet per primary care.  6 coronary artery disease-no chest pain.  Continue statin.  No aspirin given need for anticoagulation.  Kirk Ruths, MD

## 2019-01-20 NOTE — Evaluation (Signed)
Physical Therapy Evaluation Patient Details Name: [name] MRN: [id number] DOB: [birth year] Today's Date: 01/20/2019   History of Present Illness   85 y.o. male presenting 01/19/19 with shortness of breath and bil pulmonary effusions. Admitted 12/31/18 and required biPAP and bil thoracentesis. He has been admitted multiple times s/p LUE surgery June 2020 for infection. PMH is significant for CHF, atrial fibrillation, gout, type 2 diabetes, BPH, CAD, PE.  Clinical Impression   Pt admitted with above diagnosis. Patient limited by dyspnea, dizziness, and sore on bottom/sacrum. He has had a significant decline in his mobility (was working on climbing stairs at [employer] prior to decline).  Pt currently with functional limitations due to the deficits listed below (see PT Problem List). Pt will benefit from skilled PT to increase their independence and safety with mobility to allow discharge to the venue listed below.       Follow Up Recommendations SNF;Supervision/Assistance - 24 hour    Equipment Recommendations  None recommended by PT    Recommendations for Other Services OT consult     Precautions / Restrictions Precautions Precautions: Fall;Other (comment) Precaution Comments: watch BP  Restrictions Weight Bearing Restrictions: No      Mobility  Bed Mobility Overal bed mobility: Needs Assistance Bed Mobility: Supine to Sit     Supine to sit: Supervision Sit to supine: Supervision   General bed mobility comments: refused up in recliner due to sore on bottom  Transfers Overall transfer level: Needs assistance Equipment used: Rolling walker (2 wheeled);None Transfers: Sit to/from [employer] to Stand: Min assist Stand pivot transfers: Min assist       General transfer comment: felt dizzy and wanted to sit on [facility];  Ambulation/Gait             General Gait Details: unable this session.   Stairs            Wheelchair Mobility     Modified Rankin (Stroke Patients Only)       Balance Overall balance assessment: Needs assistance Sitting-balance support: Feet supported;No upper extremity supported Sitting balance-Leahy Scale: Good     Standing balance support: Bilateral upper extremity supported;During functional activity Standing balance-Leahy Scale: Poor Standing balance comment: Reliant on bUE support                              Pertinent Vitals/Pain Pain Assessment: Faces Faces Pain Scale: Hurts little more Pain Location: buttocks Pain Descriptors / Indicators: Sore Pain Intervention(s): Limited activity within patient's tolerance;Monitored during session;Repositioned(asked nurse secretary to order geomat cushion)    Home Living Family/patient expects to be discharged to:: Skilled nursing facility                 Additional Comments: Was at [facility] for rehab prior to admission.     Prior Function Level of Independence: Needs assistance   Gait / Transfers Assistance Needed: Pt working with PT on ambulation. Required assist from staff for mobility.      Comments: using RW with PT at [facility] Hand: Right    Extremity/Trunk Assessment   Upper Extremity Assessment Upper Extremity Assessment: Defer to OT evaluation    Lower Extremity Assessment Lower Extremity Assessment: Generalized weakness    Cervical / Trunk Assessment Cervical / Trunk Assessment: Kyphotic  Communication   Communication: HOH  Cognition Arousal/Alertness: Awake/alert Behavior During Therapy: WFL for tasks assessed/performed Overall  Cognitive Status: Within Functional Limits for tasks assessed                                        General Comments General comments (skin integrity, edema, etc.): wife present and agrees needs further rehab prior to home; he reports before recent decline he was working on climbing steps with PT at [employer]     Exercises     Assessment/Plan    PT Assessment Patient needs continued PT services  PT Problem List Decreased strength;Decreased mobility;Decreased safety awareness;Decreased activity tolerance;Cardiopulmonary status limiting activity;Pain;Decreased balance;Impaired sensation;Decreased knowledge of use of DME       PT Treatment Interventions DME instruction;Therapeutic activities;Gait training;Therapeutic exercise;Balance training;Stair training;Functional mobility training;Patient/family education    PT Goals (Current goals can be found in the Care Plan section)  Acute Rehab PT Goals Patient Stated Goal: to go back to [facility] so I can ge stronger PT Goal Formulation: With patient Time For Goal Achievement: 02/03/19 Potential to Achieve Goals: Good    Frequency Min 2X/week   Barriers to discharge        Co-evaluation               AM-PAC PT "6 Clicks" Mobility  Outcome Measure Help needed turning from your back to your side while in a flat bed without using bedrails?: A Little Help needed moving from lying on your back to sitting on the side of a flat bed without using bedrails?: A Little Help needed moving to and from a bed to a chair (including a wheelchair)?: A Little Help needed standing up from a chair using your arms (e.g., wheelchair or bedside chair)?: A Little Help needed to walk in hospital room?: Total Help needed climbing 3-5 steps with a railing? : Total 6 Click Score: 14    End of Session Equipment Utilized During Treatment: Oxygen Activity Tolerance: Patient limited by fatigue Patient left: with call bell/phone within reach;in bed;with family/visitor present Nurse Communication: Mobility status;Other (comment)(needs geomatt cushion due to ulcer on bottom) PT Visit Diagnosis: Unsteadiness on feet (R26.81);Muscle weakness (generalized) (M62.81);Other abnormalities of gait and mobility (R26.89);Difficulty in walking, not elsewhere classified (R26.2) Pain -  part of body: (bottom)    Time: 3154-0086 PT Time Calculation (min) (ACUTE ONLY): 32 min   Charges:   PT Evaluation $PT Eval Moderate Complexity: 1 Mod PT Treatments $Therapeutic Activity: 8-22 mins          Barry Brunner, PT      [facility] 01/20/2019, 12:24 PM

## 2019-01-20 NOTE — TOC Initial Note (Signed)
Transition of Care [facility]) - Initial/Assessment Note    Patient Details  Name: [name] MRN: [id number] Date of Birth: 05/04/1932  Transition of Care [facility]) CM/SW Contact:    Alberteen Sam, [place] Phone Number: [phone] 01/20/2019, 2:04 PM  Clinical Narrative:                  CSW consulted with patient regarding discharge plan. Patient reports he was doing short term rehab at [facility] and is in agreement to return for continued rehab. Patient gave CSW permission to contact his wife regarding any updates and when he's medically stable to discharge. [facility] is prepared to accept patient back once medically stable.   Expected Discharge Plan: Skilled Nursing Facility Barriers to Discharge: Continued Medical Work up   Patient Goals and CMS Choice Patient states their goals for this hospitalization and ongoing recovery are:: to go back to [facility] for continued rehab CMS Medicare.gov Compare Post Acute Care list provided to:: Patient Choice offered to / list presented to : Patient  Expected Discharge Plan and Services Expected Discharge Plan: [place] Choice: [place] Living arrangements for the past 2 months: Skilled Nursing Facility(From [facility] for short term rehab)                                      Prior Living Arrangements/Services Living arrangements for the past 2 months: Skilled Nursing Facility(From [facility] for short term rehab) Lives with:: Spouse Patient language and need for interpreter reviewed:: Yes Do you feel safe going back to the place where you live?: Yes      Need for Family Participation in Patient Care: Yes (Comment) Care giver support system in place?: Yes (comment)   Criminal Activity/Legal Involvement Pertinent to Current Situation/Hospitalization: No - Comment as needed  Activities of Daily Living Home Assistive Devices/Equipment: Walker (specify type) ADL Screening  (condition at time of admission) Patient's cognitive ability adequate to safely complete daily activities?: Yes Is the patient deaf or have difficulty hearing?: No Does the patient have difficulty seeing, even when wearing glasses/contacts?: No Does the patient have difficulty concentrating, remembering, or making decisions?: No Patient able to express need for assistance with ADLs?: Yes Does the patient have difficulty dressing or bathing?: Yes Independently performs ADLs?: No Communication: Independent Dressing (OT): Dependent Grooming: Dependent Feeding: Independent Bathing: Dependent Is this a change from baseline?: Pre-admission baseline Toileting: Needs assistance Is this a change from baseline?: Pre-admission baseline In/Out Bed: Needs assistance Is this a change from baseline?: Pre-admission baseline Walks in Home: Independent with device (comment) Is this a change from baseline?: Pre-admission baseline Does the patient have difficulty walking or climbing stairs?: Yes Weakness of Legs: Both Weakness of Arms/Hands: None  Permission Sought/Granted Permission sought to share information with : Case Manager, Customer service manager, Family Supports Permission granted to share information with : Yes, Verbal Permission Granted  Share Information with NAME: [name]  Permission granted to share info w AGENCY: SNFs  Permission granted to share info w Relationship: spouse  Permission granted to share info w Contact Information: [phone]  Emotional Assessment Appearance:: Appears stated age Attitude/Demeanor/Rapport: Gracious Affect (typically observed): Calm Orientation: : Oriented to Self, Oriented to Place, Oriented to  Time, Oriented to Situation Alcohol / Substance Use: Not Applicable Psych Involvement: No (comment)  Admission diagnosis:  Pleural effusion [J90]  HCAP (healthcare-associated pneumonia) [J18.9] Patient Active Problem List   Diagnosis Date Noted  .  Heart failure with preserved ejection fraction ([place]) 01/20/2019  . Acute on chronic diastolic heart failure ([place]) 01/20/2019  . Pyuria 01/20/2019  . Hematuria, microscopic 01/20/2019  . Severe Pulmonary hypertension ([place]) 01/19/2019  . Chronic hypoxemic respiratory failure ([place]) 01/19/2019  . COPD (chronic obstructive pulmonary disease) ([place]) 01/19/2019  . Hypoalbuminemia 01/19/2019  . Pleural effusion 01/19/2019  . Left atrial severely dilated 01/19/2019  . Dyspnea 12/31/2018  . Pulmonary embolism and infarction ([place])   . Acute on chronic respiratory failure with hypoxia ([place])   . 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 Mitral stenosis   . Atrial fibrillation ([place])   . Chronic anticoagulation 06/27/2013  . 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 Essential hypertension, benign 03/10/2009  . HYPERLIPIDEMIA 09/09/2008  . RENAL ARTERY STENOSIS- moderate 09/09/2008   PCP:  Nicoletta Dress, MD Pharmacy:   [facility] [place] [address] Phone: [phone] Fax: [place], [place] - [address] [place] [address] Phone: [phone] Fax: [phone]     Social Determinants of Health (SDOH) Interventions    Readmission Risk Interventions No flowsheet data found.

## 2019-01-20 NOTE — Progress Notes (Signed)
Family Medicine Teaching Service Daily Progress Note Intern Pager: [phone]  Patient name: [name] Medical record number: [id number] Date of birth: 1931-10-17 Age: 85 y.o. Gender: male  Primary Care Provider: Nicoletta Dress, MD Consultants: CCM Code Status: Full   Pt Overview and Major Events to Date:  Admitted 08/17.   Assessment and Plan: [name] is a 85 y.o. male presenting with shortness of breath. PMH is significant for CHF, atrial fibrillation, gout, type 2 diabetes, BPH, CAD, history of provoked PE.  SOB 2/2 B/L Pulmonary Effusions  Today he is still complaining of shortness of breath but has just received a breathing treatment and states that he is feeling better than yesterday.  He on 3 L of O2.  During examination he is a lip piercing forcing expiration.  Imaging today showed mild edema and bilateral pleural effusions. -telemetry - strict BR -PT /OT with improved dyspnea  -CCM consulted, recs for IV diuresis and re-evaluation  -Lasix 40 mg every 6 hours for 3 doses - Monitor Cr with diuresis  - plan for at least diagnostic tap prior to discharge   Acute on Chronic HFpEF He initially had complained of chest tightness in the emergency department today he does not complain of any chest tightness.  Troponin levels was 26.  2 weeks ago it was 26. -Cardiac monitoring -Spironolactone 25 mg daily -Hold home PO lasix of 40 mg - V lasix 40 x 3 as above  - continue home metoprolol titrate 25 mg BID - hold spironolactone 25mg  given IV lasix load   COPD  Received nebulizer treatment this morning.  Says he feels better than in the ED yesterday.  Lungs sound clear to auscultation bilaterally. -Symbicort 80-4 0.5 MCG -Levalbuterol 0.3 mg per 3 mL every 6 PRN -Incruse Ellipta 82.5 mcg 1 puff daily; will hold  Persistent atrial fibrillation, on eliquis Chronic and rate controlled. He is tachycardic.  EKG showed heart rate is 111 A. fib with nonspecific ST T  wave changes. -Continue Eliquis 5 mg twice daily -Continue metoprolol tartrate 25mg  twice daily -Telemetry -INR is 1.9   IDDM, well controlled Blood glucose yesterday is 134.  A1c 6.1 7/15 2020. - holding home AM sliding scale  - hold home metformin  - goal CBG 140-180.  - page MD with CBG > 180 for insulin instruction  - AM BMP   GERD - substitute formulary Protonix 40 mg daily  BPH Reports no urinary symptoms as he has an indwelling catheter currently. -Continue tamsulosin 0.4 mg daily - will need f/u at urology for cath (recommended at last discharge) - check to see if patient went to appt.   Asymptomatic Bacteruria  Hemoglobinuria Patient with positive nitrites, bacteria, leuks on initial UA. WBC clumps, mucus, hyaline casts, amorphous crystal and triple phosphate crystal present as well. PH 9. > 50 RBC  CAD  HLD  Last lipid panel in 2014. No repeat at this time - Continue atorvastatin 80 mg at bedtime  Hypertension Normotensive on admission  - hold benazepril 5 mg daily   Constipation  hold home Dulcolax 5 mg daily & Polyethylene glycol 17 g twice daily given complaints of diarrhea recently  - re dose if no BM x 1 day   Chronic anemia Hemoglobin on admission is 9.8 (at baseline), MCV 87.9. On home iron supplementation daily; however, no iron panel in chart.  - ferritin 111 - continue Ferrous sulfate 325 mg for now  Chronic back pain At home, on Hydrocodone  acetaminophen 5 325 mg every 4 PRN; will hold in setting of dyspnea - please notify MD for pain  - tylenol PRN   Gout - Continue allopurinol 300mg  daily  Pressure Ulcers, sacrum  - daily wound care  - foam dressing to sacrum daily  - q2 hour bed shifting and readjustment   Goals of care - Continue to diurese and consider thoracentesis if necessary   FEN/GI: Modified carb diet, Protonix 40 mg PPx: Not necessary patient already on Eliquis  Disposition: Return to [facility] pending improved  dyspnea  Subjective:  He is in bed resting. He is having labored breathing but has just received a breathing treatment and says he feels much better. He denies chest pain and chest tightness.   Objective: Temp:  [97.6 F (36.4 C)-98.2 F (36.8 C)] 97.6 F (36.4 C) (08/18 0521) Pulse Rate:  [90-107] 90 (08/18 0521) Resp:  [18-39] 18 (08/18 0521) BP: (105-140)/(54-75) 105/56 (08/18 0521) SpO2:  [96 %-100 %] 100 % (08/18 0521) Weight:  [79.6 kg-81.3 kg] 79.6 kg (08/18 0521)  Physical Exam: General: Appears well, no acute distress. Age appropriate. Cardiac: RRR, normal heart sounds, no murmurs Respiratory: CTAB, increased effort with exhalation Abdomen: soft, nontender, nondistended Extremities: No edema or cyanosis. Skin: Warm and dry, no rashes noted Neuro: alert and oriented, no focal deficits Psych: normal affect   Laboratory: Recent Labs  Lab 01/19/19 1408  WBC 8.0  HGB 9.8*  HCT 32.0*  PLT 421*   Recent Labs  Lab 01/19/19 1408 01/20/19 0023  NA 137 137  K 4.0 3.8  CL 94* 93*  CO2 31 33*  BUN 13 12  CREATININE 0.64 0.68  CALCIUM 8.8* 8.9  PROT 5.8*  --   BILITOT 0.7  --   ALKPHOS 165*  --   ALT 30  --   AST 33  --   GLUCOSE 134* 116*      Imaging/Diagnostic Tests: PORTABLE CHEST 1 VIEW Result date: 01/20/2019. COMPARISON:  01/19/2019 IMPRESSION: Mild edema and bilateral pleural effusions. The remainder of the exam is stable.  Gerlene Fee, DO 01/20/2019, 6:56 AM PGY-1, [place] Intern pager: [phone], text pages welcome

## 2019-01-20 NOTE — NC FL2 (Signed)
[place] LEVEL OF CARE SCREENING TOOL     IDENTIFICATION  Patient Name: [name] Birthdate: 04/04/1932 Sex: male Admission Date (Current Location): 01/19/2019  [facility] and [place] Number:  Herbalist and Address:  [facility], [place] [address] [place], [place] [address]      Provider Number: [id number]  Attending Physician Name and Address:  McDiarmid, Blane Ohara, MD  Relative Name and Phone Number:  [phone]    Current Level of Care: Hospital Recommended Level of Care: [place] Prior Approval Number:    Date Approved/Denied:   PASRR Number: [id number]  Discharge Plan: SNF    Current Diagnoses: Patient Active Problem List   Diagnosis Date Noted  . Heart failure with preserved ejection fraction ([place]) 01/20/2019  . Acute on chronic diastolic heart failure ([place]) 01/20/2019  . Pyuria 01/20/2019  . Hematuria, microscopic 01/20/2019  . Severe Pulmonary hypertension ([place]) 01/19/2019  . Chronic hypoxemic respiratory failure ([place]) 01/19/2019  . COPD (chronic obstructive pulmonary disease) ([place]) 01/19/2019  . Hypoalbuminemia 01/19/2019  . Pleural effusion 01/19/2019  . Left atrial severely dilated 01/19/2019  . Dyspnea 12/31/2018  . Pulmonary embolism and infarction ([place])   . Acute on chronic respiratory failure with hypoxia ([place])   . 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 Mitral stenosis   . Atrial fibrillation ([place])   . Chronic anticoagulation 06/27/2013  . 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 Essential hypertension, benign 03/10/2009  . HYPERLIPIDEMIA 09/09/2008  . RENAL ARTERY STENOSIS- moderate 09/09/2008    Orientation RESPIRATION BLADDER Height & Weight     Self, Time, Situation, Place  O2(nasal cannula 3L/m) Continent Weight: 175 lb 7.8 oz (79.6 kg) Height:  6' (182.9 cm)  BEHAVIORAL  SYMPTOMS/MOOD NEUROLOGICAL BOWEL NUTRITION STATUS      Continent Diet(see discharge summary)  AMBULATORY STATUS COMMUNICATION OF NEEDS Skin   Limited Assist Verbally PU Stage and Appropriate Care, Other (Comment)(Pressure Injury stage 2 buttocks, pressure injury unstageable heel)                       Personal Care Assistance Level of Assistance  Bathing, Feeding, Dressing, Total care Bathing Assistance: Limited assistance Feeding assistance: Independent Dressing Assistance: Limited assistance Total Care Assistance: Limited assistance   Functional Limitations Info  Sight, Hearing, Speech Sight Info: Adequate Hearing Info: Impaired Speech Info: Adequate    SPECIAL CARE FACTORS FREQUENCY  PT (By licensed PT), OT (By licensed OT)     PT Frequency: min 5x weekly OT Frequency: min 5x weekly            Contractures Contractures Info: Not present    Additional Factors Info  Code Status, Allergies Code Status Info: Full Allergies Info: Bactrim (sulfamethoxazoletrimethoprim), Levaquin (levofloxacin)           Current Medications (01/20/2019):  This is the current hospital active medication list Current Facility-Administered Medications  Medication Dose Route Frequency Provider Last Rate Last Dose  . acetaminophen (TYLENOL) tablet 650 mg  650 mg Oral Q6H PRN Wilber Oliphant, MD       Or  . acetaminophen (TYLENOL) suppository 650 mg  650 mg Rectal Q6H PRN Wilber Oliphant, MD      . allopurinol (ZYLOPRIM) tablet 300 mg  300 mg Oral Daily Wilber Oliphant, MD   300 mg at 01/20/19 0837  . apixaban (ELIQUIS) tablet 5 mg  5 mg Oral BID Wilber Oliphant, MD   5 mg at 01/20/19 5638  . atorvastatin (LIPITOR) tablet 80 mg  80 mg Oral QHS Wilber Oliphant, MD   80 mg at 01/19/19 2218  . brimonidine (ALPHAGAN) 0.15 % ophthalmic solution 1 drop  1 drop Both Eyes BID Wilber Oliphant, MD   1 drop at 01/20/19 0851  . ferrous sulfate tablet 325 mg  325 mg Oral Q breakfast Wilber Oliphant, MD   325 mg at  01/20/19 9373  . furosemide (LASIX) injection 40 mg  40 mg Intravenous Once Enid Derry, [place], DO      . levalbuterol Penne Lash) nebulizer solution 0.63 mg  0.63 mg Nebulization Q6H PRN Wilber Oliphant, MD   0.63 mg at 01/20/19 4287  . Melatonin TABS 6 mg  6 mg Oral QHS McDiarmid, Blane Ohara, MD   6 mg at 01/20/19 0018  . metoprolol tartrate (LOPRESSOR) tablet 25 mg  25 mg Oral BID Wilber Oliphant, MD   25 mg at 01/20/19 [phone]  . multivitamin with minerals tablet 1 tablet  1 tablet Oral Daily Wilber Oliphant, MD   1 tablet at 01/20/19 [phone]  . pantoprazole (PROTONIX) EC tablet 40 mg  40 mg Oral Daily Wilber Oliphant, MD   40 mg at 01/20/19 0836  . spironolactone (ALDACTONE) tablet 25 mg  25 mg Oral Daily Wilber Oliphant, MD   25 mg at 01/20/19 2035  . tamsulosin (FLOMAX) capsule 0.4 mg  0.4 mg Oral QPM Wilber Oliphant, MD      . umeclidinium bromide (INCRUSE ELLIPTA) 62.5 MCG/INH 1 puff  1 puff Inhalation Daily Wilber Oliphant, MD   1 puff at 01/20/19 0708     Discharge Medications: Please see discharge summary for a list of discharge medications.  Relevant Imaging Results:  Relevant Lab Results:   Additional Information SSN: [id number]  Alberteen Sam, LCSW

## 2019-01-21 ENCOUNTER — Inpatient Hospital Stay (HOSPITAL_COMMUNITY): Payer: Medicare Other

## 2019-01-21 DIAGNOSIS — J810 Acute pulmonary edema: Secondary | ICD-10-CM

## 2019-01-21 LAB — BASIC METABOLIC PANEL
Anion gap: 11 (ref 5–15)
BUN: 15 mg/dL (ref 8–23)
CO2: 33 mmol/L — ABNORMAL HIGH (ref 22–32)
Calcium: 8.8 mg/dL — ABNORMAL LOW (ref 8.9–10.3)
Chloride: 92 mmol/L — ABNORMAL LOW (ref 98–111)
Creatinine, Ser: 0.8 mg/dL (ref 0.61–1.24)
GFR calc Af Amer: >60 mL/min (ref >60)
GFR calc non Af Amer: >60 mL/min (ref >60)
Glucose, Bld: 111 mg/dL — ABNORMAL HIGH (ref 70–99)
Potassium: 3.7 mmol/L (ref 3.5–5.1)
Sodium: 136 mmol/L (ref 135–145)

## 2019-01-21 LAB — CBC
HCT: 29.4 % — ABNORMAL LOW (ref 39.0–52.0)
Hemoglobin: 9.3 g/dL — ABNORMAL LOW (ref 13.0–17.0)
MCH: 27.1 pg (ref 26.0–34.0)
MCHC: 31.6 g/dL (ref 30.0–36.0)
MCV: 85.7 fL (ref 80.0–100.0)
Platelets: 386 10*3/uL (ref 150–400)
RBC: 3.43 MIL/uL — ABNORMAL LOW (ref 4.22–5.81)
RDW: 15.7 % — ABNORMAL HIGH (ref 11.5–15.5)
WBC: 7.6 10*3/uL (ref 4.0–10.5)
nRBC: 0 % (ref 0.0–0.2)

## 2019-01-21 MED ORDER — FUROSEMIDE 10 MG/ML IJ SOLN
80.0000 mg | Freq: Two times a day (BID) | INTRAMUSCULAR | Status: DC
  Administered 2019-01-21 – 2019-01-27 (×12): 80 mg via INTRAVENOUS
  Filled 2019-01-21 (×12): qty 8

## 2019-01-21 MED ORDER — POTASSIUM CHLORIDE CRYS ER 20 MEQ PO TBCR
40.0000 meq | EXTENDED_RELEASE_TABLET | Freq: Once | ORAL | Status: AC
  Administered 2019-01-21: 40 meq via ORAL
  Filled 2019-01-21: qty 2

## 2019-01-21 NOTE — Progress Notes (Signed)
Progress Note  Patient Name: [name] Date of Encounter: 01/21/2019  Primary Cardiologist: Dr Stanford Breed  Subjective   Notes DOE; no Chest pain  Inpatient Medications    Scheduled Meds:  allopurinol  300 mg Oral Daily   apixaban  5 mg Oral BID   atorvastatin  80 mg Oral QHS   brimonidine  1 drop Both Eyes BID   ferrous sulfate  325 mg Oral Q breakfast   furosemide  40 mg Intravenous BID   Melatonin  6 mg Oral QHS   metoprolol tartrate  25 mg Oral BID   multivitamin with minerals  1 tablet Oral Daily   pantoprazole  40 mg Oral Daily   spironolactone  25 mg Oral Daily   tamsulosin  0.4 mg Oral QPM   umeclidinium bromide  1 puff Inhalation Daily   Continuous Infusions:  PRN Meds: acetaminophen **OR** acetaminophen, levalbuterol   Vital Signs    Vitals:   01/21/19 0732 01/21/19 0734 01/21/19 0745 01/21/19 0840  BP: (!) 125/56     Pulse: 93 92    Resp: 16     Temp:  97.9 F (36.6 C)    TempSrc:  Oral    SpO2: 100% 100% 100% 100%  Weight:      Height:        Intake/Output Summary (Last 24 hours) at 01/21/2019 0857 Last data filed at 01/21/2019 0500 Gross per 24 hour  Intake 900 ml  Output 1426 ml  Net -526 ml   Last 3 Weights 01/21/2019 01/20/2019 01/19/2019  Weight (lbs) 177 lb 11.1 oz 175 lb 7.8 oz 176 lb 12.9 oz  Weight (kg) 80.6 kg 79.6 kg 80.2 kg      Telemetry    Atrial fibrillation- Personally Reviewed   Physical Exam   GEN: No acute distress.  Frail Neck: suupple Cardiac: irregular Respiratory: Diminished BS bases GI: Soft, nontender, non-distended  MS: No edema Neuro:  Nonfocal  Psych: Normal affect   Labs    High Sensitivity Troponin:   Recent Labs  Lab 12/31/18 1635 01/01/19 0128 01/01/19 0344 01/01/19 0605 01/20/19 0023  TROPONINIHS 34* 27* 26* 22* 26*      Chemistry Recent Labs  Lab 01/19/19 1408 01/20/19 0023 01/21/19 0446  NA 137 137 136  K 4.0 3.8 3.7  CL 94* 93* 92*  CO2 31 33* 33*  GLUCOSE  134* 116* 111*  BUN 13 12 15   CREATININE 0.64 0.68 0.80  CALCIUM 8.8* 8.9 8.8*  PROT 5.8*  --   --   ALBUMIN 2.6*  --   --   AST 33  --   --   ALT 30  --   --   ALKPHOS 165*  --   --   BILITOT 0.7  --   --   GFRNONAA >60 >60 >60  GFRAA >60 >60 >60  ANIONGAP 12 11 11      Hematology Recent Labs  Lab 01/19/19 1408 01/21/19 0446  WBC 8.0 7.6  RBC 3.64* 3.43*  HGB 9.8* 9.3*  HCT 32.0* 29.4*  MCV 87.9 85.7  MCH 26.9 27.1  MCHC 30.6 31.6  RDW 15.9* 15.7*  PLT 421* 386    BNP Recent Labs  Lab 01/19/19 1408  BNP 222.4*      Radiology    Dg Chest Port 1 View  Result Date: 01/21/2019 CLINICAL DATA:  Shortness of breath, respiratory failure, atrial fibrillation, coronary artery disease post MI, COPD, CHF, diabetes mellitus, hypertension EXAM: PORTABLE CHEST  1 VIEW COMPARISON:  Portable exam 4580 hours compared to 01/20/2019 FINDINGS: Enlargement of cardiac silhouette post CABG and TAVR. Atherosclerotic calcification aorta. Pulmonary vascular congestion. Mild interstitial edema with bibasilar pleural effusions and basilar atelectasis, not significantly changed. No pneumothorax. Bones demineralized. IMPRESSION: Persistent CHF with bibasilar effusions and atelectasis, unchanged. Electronically Signed   By: Lavonia Dana M.D.   On: 01/21/2019 08:22   Dg Chest Port 1 View  Result Date: 01/20/2019 CLINICAL DATA:  Follow-up pleural effusion EXAM: PORTABLE CHEST 1 VIEW COMPARISON:  01/19/2019 FINDINGS: Cardiac shadow remains enlarged. Changes of prior surgery and prior TAVR are again noted and stable. The lungs are well aerated with persistent left basilar consolidation. Small pleural effusions are again seen and stable. The vascular congestion has improved somewhat in the interval. Mild interstitial edema remains. No bony abnormality is noted. IMPRESSION: Mild edema and bilateral pleural effusions. The remainder of the exam is stable. Electronically Signed   By: Inez Catalina M.D.   On:  01/20/2019 07:26   Dg Chest Port 1 View  Result Date: 01/19/2019 CLINICAL DATA:  85 year old with shortness of breath EXAM: PORTABLE CHEST 1 VIEW COMPARISON:  01/02/2019 and prior radiographs FINDINGS: Cardiomegaly, pulmonary vascular congestion and mild interstitial opacities noted. Small pleural effusions are present. LEFT LOWER lung atelectasis/airspace disease identified. There is no evidence of pneumothorax. IMPRESSION: Cardiomegaly with pulmonary vascular congestion, small bilateral pleural effusions and possible mild interstitial edema. LEFT LOWER lung atelectasis/airspace disease. Electronically Signed   By: Margarette Canada M.D.   On: 01/19/2019 14:58    Patient Profile     85 year old male with past medical history of coronary artery disease status post coronary artery bypass graft, prior TAVR for aortic stenosis, permanent atrial fibrillation, mitral stenosis, COPD for evaluation of acute on chronic diastolic congestive heart failure.  Patient had TAVR April 2019.  Last echocardiogram July 2020 showed normal LV function, severe left atrial enlargement, prior TAVR with mean gradient 11 mmHg, trivial aortic insufficiency, moderate to severe mitral stenosis with mean gradient 13 mmHg and mild to moderate mitral regurgitation, moderate tricuspid regurgitation.  Patient recently discharged following admission for CHF.  Had bilateral thoracenteses at that time but fluid was not sent for studies by report.  Since discharge he notes progressive dyspnea on exertion, orthopnea and increased abdominal girth.   Assessment & Plan    1 acute on chronic diastolic congestive heart failure-Wt 80.6 kg; I/O -826.  Some improvement this morning but dyspnea persists.  Increase Lasix to 80 mg IV twice daily.  Follow renal function closely. We may be limited by blood pressure. Likely contribution from mitral stenosis. His albumin is also 2.6 which could be contributing.    2 mitral valve disease-moderate to severe  mitral stenosis and mild to moderate mitral regurgitation.  Patient would not be a candidate for mitral valve surgery.  3 permanent atrial fibrillation-we will continue with metoprolol for rate control.  If blood pressure becomes an issue it might be worth trying to control weight with digoxin or amiodarone and decrease metoprolol.  Continue apixaban 5 mg twice daily. CHADSvasc 6.  4 status post TAVR-last echocardiogram showed normally functioning valve.  5 COPD-continue pulmonary toilet per primary care.  6 coronary artery disease-no chest pain.  Continue statin.  No aspirin given need for anticoagulation.  For questions or updates, please contact [place] Please consult [email or web address] for contact info under        Signed, Kirk Ruths, MD  01/21/2019, 8:57 AM

## 2019-01-21 NOTE — Plan of Care (Signed)

## 2019-01-21 NOTE — Progress Notes (Addendum)
Family Medicine Teaching Service Daily Progress Note Intern Pager: [phone]  Patient name: [name] Medical record number: [id number] Date of birth: July 15, 1931 Age: 85 y.o. Gender: male  Primary Care Provider: Nicoletta Dress, MD Consultants: CCM Code Status: Full   Pt Overview and Major Events to Date:  Admitted 08/17.   Assessment and Plan: [name] is a 85 y.o. male presenting with shortness of breath. PMH is significant for CHF, atrial fibrillation, gout, type 2 diabetes, BPH, CAD, history of provoked PE.  SOB 2/2 B/L Pulmonary Effusions  On 3 L of nasal cannula saturation at 100% -telemetry - strict BR -PT /OT with improved dyspnea  -CCM consulted, recs for IV diuresis and re-evaluation  -Lasix 80mg  twice daily - Monitor Cr (today 0.80) with diuresis  - currently not planning for a thoracentesis - pleurx drain offered; patient refused  Acute on Chronic HFpEF Some improvement but is still dyspneic rest.  Likely contributed to mitral stenosis.  Stenosis is moderate to severe with regurgitation not a good candidate for mitral valve surgery per cardiology. -Cardiac monitoring -Spironolactone 25 mg daily -Hold home PO lasix of 40 mg - IV lasix 80 twice daily - continue home metoprolol titrate 25 mg BID - hold spironolactone 25mg  given IV lasix load   COPD  Received breathing treatment this morning.  Denies chest pain or chest tightness. -Symbicort 80-4 0.5 MCG -Levalbuterol 0.3 mg per 3 mL every 6 PRN -Incruse Ellipta 82.5 mcg 1 puff daily; will hold  Persistent atrial fibrillation, on eliquis Chronic and rate controlled. He is not tachycardic heart rate overnight 88-92.  EKG showed heart rate is 88 A. fib with nonspecific ST T wave changes. -Continue Eliquis 5 mg twice daily -Continue metoprolol tartrate 25mg  twice daily -Telemetry -INR is 1.9   IDDM, well controlled Blood glucose TODAY is 111.  A1c 6.1 7/15 2020. - holding home AM sliding  scale  - hold home metformin  - goal CBG 140-180.  - page MD with CBG > 180 for insulin instruction  - AM BMP   GERD - substitute formulary Protonix 40 mg daily  BPH Reports no urinary symptoms as he has an indwelling catheter currently. -Continue tamsulosin 0.4 mg daily - will need f/u at urology for cath (recommended at last discharge) - check to see if patient went to appt.   Asymptomatic Bacteruria  Hemoglobinuria Patient with positive nitrites, bacteria, leuks on initial UA. WBC clumps, mucus, hyaline casts, amorphous crystal and triple phosphate crystal present as well. PH 9. > 50 RBC  CAD  HLD  Last lipid panel in 2014. No repeat at this time - Continue atorvastatin 80 mg at bedtime  Hypertension Normotensive. - hold benazepril 5 mg daily   Constipation  hold home Dulcolax 5 mg daily & Polyethylene glycol 17 g twice daily given complaints of diarrhea recently  - re dose if no BM x 1 day   Chronic anemia Hemoglobin on admission is 9.8 (at baseline), MCV 87.9. On home iron supplementation daily; however, no iron panel in chart.  - ferritin 111 - continue Ferrous sulfate 325 mg for now  Chronic back pain At home, on Hydrocodone acetaminophen 5 325 mg every 4 PRN; will hold in setting of dyspnea - please notify MD for pain  - tylenol PRN   Gout - Continue allopurinol 300mg  daily  Pressure Ulcers, sacrum  - daily wound care  - foam dressing to sacrum daily  - q2 hour bed shifting and readjustment  Goals of care - Continue to diurese and consider thoracentesis if necessary   FEN/GI: Modified carb diet, Protonix 40 mg PPx: Not necessary patient already on Eliquis  Disposition: Return to [place] pending improved dyspnea  Subjective: He says he is feeling well and much better than yesterday.  He is just received a breathing treatment this morning and feels better breathing wise but is still having increased effort on  exhalation.  Objective: Temp:  [97.4 F (36.3 C)-97.7 F (36.5 C)] 97.7 F (36.5 C) (08/19 0557) Pulse Rate:  [83-101] 96 (08/19 0557) Resp:  [17-19] 18 (08/18 1948) BP: (100-123)/(53-81) 120/62 (08/19 0557) SpO2:  [98 %-100 %] 100 % (08/19 0557) Weight:  [80.6 kg] 80.6 kg (08/19 0557)  Physical Exam: General: Appears well, mild distress with breathing. Age appropriate. Cardiac: RRR, normal heart sounds, no murmurs Respiratory: Audible rales, increase effort Abdomen: soft, nontender, nondistended Extremities: No edema or cyanosis. Skin: Warm and dry, no rashes noted Neuro: alert and oriented, no focal deficits Psych: normal affect    Laboratory: Recent Labs  Lab 01/19/19 1408 01/21/19 0446  WBC 8.0 7.6  HGB 9.8* 9.3*  HCT 32.0* 29.4*  PLT 421* 386   Recent Labs  Lab 01/19/19 1408 01/20/19 0023 01/21/19 0446  NA 137 137 136  K 4.0 3.8 3.7  CL 94* 93* 92*  CO2 31 33* 33*  BUN 13 12 15   CREATININE 0.64 0.68 0.80  CALCIUM 8.8* 8.9 8.8*  PROT 5.8*  --   --   BILITOT 0.7  --   --   ALKPHOS 165*  --   --   ALT 30  --   --   AST 33  --   --   GLUCOSE 134* 116* 111*      Imaging/Diagnostic Tests:  PORTABLE CHEST 1 VIEW Result date: 01/21/2019.  COMPARISON:  01/20/2019 IMPRESSION: Persistent CHF with bibasilar effusions and atelectasis, unchanged.   Gerlene Fee, DO 01/21/2019, 6:54 AM PGY-1, [place] Intern pager: [phone], text pages welcome

## 2019-01-21 NOTE — Progress Notes (Signed)
Palliative:  I had a family meeting that ran late with another patient. I came by to visit but wife not at bedside and [name] appeared to be resting. I did not disturb him. Will try and catch wife again tomorrow to continue [place] conversation.   No charge  Vinie Sill, NP Palliative Medicine Team Pager [phone] (Please see [email or web address] for schedule) Team Phone [phone]

## 2019-01-21 NOTE — Plan of Care (Signed)

## 2019-01-21 NOTE — Progress Notes (Addendum)
NAME:  [name], MRN:  [id number], DOB:  1931/10/08, LOS: 0 ADMISSION DATE:  01/19/2019, CONSULTATION DATE:  01/19/2019 REFERRING MD:  McDiarmid - [facility], CHIEF COMPLAINT:  SOB   Brief History   85 yo M who presented to ED 8/17 with from [facility] with CC SOB. Recently admitted with dyspnea, bilateral pleural effusions s/p bilateral thoracentesis 7/30, 7/31.   Past Medical History  CAD, s/p CABG MI Atrial Fibrillation on eliquis Aortic stenosis Carotid stenosis  CHF COPD on 2-4LNC GERD PUD Renal artery stenosis Sleep Apnea HTN HLD DM Depression  Significant Hospital Events   8/17 Admit with dyspnea. Admitted to [facility].    Consults:  PCCM  Procedures:    Significant Diagnostic Tests:  CXR 8/17 > LLL atelectasis. Bilateral pleural effusion. Cardiomegaly. Pulmonary vascular congestion. Possible interstitial edema   Micro Data:  SARS Cov2 > negative 8/17  Antimicrobials:  Vanc 8/17 >> 8/18 Zosyn 8/17 >> 8/18  Interim history/subjective:  I/O - 1.7L UOP / net net 826 in 24h (3.2L negative since admit). Afebrile.  Pt reports clear sputum production.  Denies fevers.  Feels better since admit.  Asking for a "pickle" referring to a flutter valve.    Objective    Vitals:   01/21/19 0732 01/21/19 0734 01/21/19 0745 01/21/19 0840  BP: (!) 125/56     Pulse: 93 92    Resp: 16     Temp:  97.9 F (36.6 C)    TempSrc:  Oral    SpO2: 100% 100% 100% 100%  Weight:      Height:       I/O last 3 completed shifts: In: 1140 [P.O.:1140] Out: 3676 [Urine:3675; Stool:1] No intake/output data recorded.   Examination: General: pleasant elderly male lying in bed in NAD HEENT: MM pink/moist Neuro: AAOx4, speech clear, MAE  CV: s1s2 irr, no m/r/g PULM:  Even/non-labored, lungs bilaterally clear anterior, diminished lower posterior with few crackles  GI: soft, bsx4 active  Extremities: warm/dry, no edema  Skin: no rashes or lesions  Resolved Hospital  Problem list     Assessment & Plan:  86 year old male with PMH of CHF, recent PE and 2-4L O2 dependent COPD admitted with SOB.  Patient was seen on 7/30 for pleural effusion and bilateral thoracentesis were completed but studies sent for fluid analysis.   Bilateral Pleural Effusions -fluid appearance description would suggest transudative process -suspect secondary to CHF  P: Diuresis per Cardiology > lasix 80 mg IV BID  Follow intermittent CXR  Hold further thoracentesis for now.  Additionally, no role for pleurX catheter in this setting. Will notify Dr. Tamala Julian (pulmonary) of patients admission   Chronic Hypoxemic Respiratory Failure  -baseline O2 2-4L  P: Continue O2 support  Wean O2 for sats 88-95%  COPD without Exacerbation P: Continue Incruse PRN xopenex   Doubt HCAP > Suspect Atelectasis -no WBC, PCT <1.1 -no indication of HCAP at this point P: Monitor off abx  Pulmonary hygiene - mobilize, flutter valve  Have contacted respiratory therapy for flutter valve  Pulmonary Edema P: Diuresis as above  Follow CXR     Labs   CBC: Recent Labs  Lab 01/19/19 1408 01/21/19 0446  WBC 8.0 7.6  NEUTROABS 5.5  --   HGB 9.8* 9.3*  HCT 32.0* 29.4*  MCV 87.9 85.7  PLT 421* 497    Basic Metabolic Panel: Recent Labs  Lab 01/19/19 1408 01/20/19 0023 01/21/19 0446  NA 137 137 136  K 4.0  3.8 3.7  CL 94* 93* 92*  CO2 31 33* 33*  GLUCOSE 134* 116* 111*  BUN 13 12 15   CREATININE 0.64 0.68 0.80  CALCIUM 8.8* 8.9 8.8*   GFR: Estimated Creatinine Clearance: 74.1 mL/min (by C-G formula based on SCr of 0.8 mg/dL). Recent Labs  Lab 01/19/19 1408 01/19/19 1840 01/21/19 0446  PROCALCITON  --  <0.10  --   WBC 8.0  --  7.6    Liver Function Tests: Recent Labs  Lab 01/19/19 1408  AST 33  ALT 30  ALKPHOS 165*  BILITOT 0.7  PROT 5.8*  ALBUMIN 2.6*   No results for input(s): LIPASE, AMYLASE in the last 168 hours. No results for input(s): AMMONIA in the last 168  hours.  ABG    Component Value Date/Time   PHART 7.567 (H) 01/01/2019 0740   PCO2ART 31.8 (L) 01/01/2019 0740   PO2ART 133 (H) 01/01/2019 0740   HCO3 29.3 (H) 01/01/2019 0740   TCO2 33 (H) 12/17/2018 1128   ACIDBASEDEF TEST WILL BE CREDITED 09/13/2017 1049   O2SAT 99.6 01/01/2019 0740     Coagulation Profile: Recent Labs  Lab 01/20/19 0023  INR 1.9*    Cardiac Enzymes: No results for input(s): CKTOTAL, CKMB, CKMBINDEX, TROPONINI in the last 168 hours.  HbA1C: Hgb A1c MFr Bld  Date/Time Value Ref Range Status  12/17/2018 04:23 PM 6.1 (H) 4.8 - 5.6 % Final    Comment:    (NOTE) Pre diabetes:          5.7%-6.4% Diabetes:              >6.4% Glycemic control for   <7.0% adults with diabetes   09/13/2017 10:50 AM 6.1 (H) 4.8 - 5.6 % Final    Comment:    (NOTE) Pre diabetes:          5.7%-6.4% Diabetes:              >6.4% Glycemic control for   <7.0% adults with diabetes     CBG: No results for input(s): GLUCAP in the last 168 hours.  Noe Gens, NP-C [facility] Pgr: [phone] or if no answer [phone] 01/21/2019, 9:51 AM  Attending Note:  85 year old with CHF history with bilateral pleural effusion and pulmonary edema.  Diuresed another liter overnight but BUN:Cr ratio remains <20:1.  O2 demand is dropping, 100% persistently on 3L.  Continues to complain of subjective dyspnea.  On exam, bibasilar crackles.  I reviewed CXR myself, no significant change.  Negative only 826 ml over 24 hours.  Discussed with PCCM-NP.  Dyspnea: Subective             - Continue diureses  Acute pulmonary edema:             - Diureses 40 q6 x3 doses             - Replace K             - BMET             - CXR in AM  Hypokalemia:             - Replace K  Pleural effusion:             - CXR in AM             - No thora  PCCM will continue to follow  Patient seen and examined, agree with above note.  I dictated the  care and orders written for this  patient under my direction.  Rush Farmer, [place]

## 2019-01-21 NOTE — Progress Notes (Signed)
Patient asked for a breathing treatment.  He said he takes them at home when he has trouble breathing.  Patient was not wheezing, does not appear to be in any distress, but I went ahead and gave him his treatment.  Will continue to monitor.

## 2019-01-22 ENCOUNTER — Inpatient Hospital Stay (HOSPITAL_COMMUNITY): Payer: Medicare Other

## 2019-01-22 ENCOUNTER — Inpatient Hospital Stay: Payer: Medicare Other | Admitting: Adult Health

## 2019-01-22 DIAGNOSIS — R0902 Hypoxemia: Secondary | ICD-10-CM

## 2019-01-22 DIAGNOSIS — Z960 Presence of urogenital implants: Secondary | ICD-10-CM

## 2019-01-22 LAB — BASIC METABOLIC PANEL
Anion gap: 10 (ref 5–15)
BUN: 14 mg/dL (ref 8–23)
CO2: 33 mmol/L — ABNORMAL HIGH (ref 22–32)
Calcium: 8.9 mg/dL (ref 8.9–10.3)
Chloride: 92 mmol/L — ABNORMAL LOW (ref 98–111)
Creatinine, Ser: 0.74 mg/dL (ref 0.61–1.24)
GFR calc Af Amer: >60 mL/min (ref >60)
GFR calc non Af Amer: >60 mL/min (ref >60)
Glucose, Bld: 112 mg/dL — ABNORMAL HIGH (ref 70–99)
Potassium: 3.7 mmol/L (ref 3.5–5.1)
Sodium: 135 mmol/L (ref 135–145)

## 2019-01-22 LAB — CBC
HCT: 30.2 % — ABNORMAL LOW (ref 39.0–52.0)
Hemoglobin: 9.4 g/dL — ABNORMAL LOW (ref 13.0–17.0)
MCH: 26.6 pg (ref 26.0–34.0)
MCHC: 31.1 g/dL (ref 30.0–36.0)
MCV: 85.3 fL (ref 80.0–100.0)
Platelets: 394 10*3/uL (ref 150–400)
RBC: 3.54 MIL/uL — ABNORMAL LOW (ref 4.22–5.81)
RDW: 15.6 % — ABNORMAL HIGH (ref 11.5–15.5)
WBC: 8.1 10*3/uL (ref 4.0–10.5)
nRBC: 0 % (ref 0.0–0.2)

## 2019-01-22 LAB — GLUCOSE, CAPILLARY
Glucose-Capillary: 109 mg/dL — ABNORMAL HIGH (ref 70–99)
Glucose-Capillary: 134 mg/dL — ABNORMAL HIGH (ref 70–99)

## 2019-01-22 MED ORDER — HYDROCODONE-ACETAMINOPHEN 5-325 MG PO TABS
1.0000 | ORAL_TABLET | ORAL | Status: DC | PRN
  Administered 2019-01-22 – 2019-01-27 (×5): 1 via ORAL
  Filled 2019-01-22 (×5): qty 1

## 2019-01-22 MED ORDER — LEVALBUTEROL HCL 0.63 MG/3ML IN NEBU
0.6300 mg | INHALATION_SOLUTION | Freq: Three times a day (TID) | RESPIRATORY_TRACT | Status: DC
  Administered 2019-01-23 – 2019-01-28 (×17): 0.63 mg via RESPIRATORY_TRACT
  Filled 2019-01-22 (×17): qty 3

## 2019-01-22 NOTE — Progress Notes (Signed)
Physical Therapy Treatment Patient Details Name: [name] MRN: [id number] DOB: 09-Apr-1932 Today's Date: 01/22/2019    History of Present Illness  85 y.o. male presenting 01/19/19 with shortness of breath and bil pulmonary effusions. Admitted 12/31/18 and required biPAP and bil thoracentesis. He has been admitted multiple times s/p LUE surgery June 2020 for infection. PMH is significant for CHF, atrial fibrillation, gout, type 2 diabetes, BPH, CAD, PE.    PT Comments    Patient received in bed, wife present for PT session. Patient reluctant to attempt walking. Agrees to sit on side of bed and attempt to get to recliner. Patient requires supervision for supine>< sit. Upon sitting on side of bed, patient reports dizziness. Increased respiratory rate. O2 sats remained at 98% and HR at 89. Patient given cues for slow deep breaths. With sitting for approximately 5 minutes patient's dizziness did not subside and he requests to lie back down. Patient will benefit from continued skilled PT to improve activity tolerance, strength, and functional mobility.      Follow Up Recommendations  SNF;Supervision/Assistance - 24 hour     Equipment Recommendations  None recommended by PT    Recommendations for Other Services       Precautions / Restrictions Precautions Precautions: Fall Precaution Comments: watch BP  Restrictions Weight Bearing Restrictions: No Other Position/Activity Restrictions: L forearm with dressing    Mobility  Bed Mobility Overal bed mobility: Needs Assistance Bed Mobility: Supine to Sit;Sit to Supine     Supine to sit: Supervision Sit to supine: Supervision   General bed mobility comments: patient reports feeling "woozie" sitting up on side of bed. Sat for approximately 5 min. did not resolve, therefore patient returned to supine.  Transfers                    Ambulation/Gait             General Gait Details: patient declines due to  dizziness   Stairs             Wheelchair Mobility    Modified Rankin (Stroke Patients Only)       Balance Overall balance assessment: Modified Independent Sitting-balance support: Feet supported Sitting balance-Leahy Scale: Good                                      Cognition Arousal/Alertness: Awake/alert Behavior During Therapy: WFL for tasks assessed/performed Overall Cognitive Status: Within Functional Limits for tasks assessed                                        Exercises      General Comments        Pertinent Vitals/Pain Pain Assessment: No/denies pain    Home Living                      Prior Function            PT Goals (current goals can now be found in the care plan section) Acute Rehab PT Goals Patient Stated Goal: to go back to [facility] so I can ge stronger PT Goal Formulation: With patient/family Time For Goal Achievement: 02/03/19 Potential to Achieve Goals: Good Progress towards PT goals: Not progressing toward goals - comment(patient limited by dizziness, unable to ambulate)  Frequency    Min 2X/week      PT Plan Current plan remains appropriate    Co-evaluation              AM-PAC PT "6 Clicks" Mobility   Outcome Measure  Help needed turning from your back to your side while in a flat bed without using bedrails?: A Little Help needed moving from lying on your back to sitting on the side of a flat bed without using bedrails?: A Little Help needed moving to and from a bed to a chair (including a wheelchair)?: A Little Help needed standing up from a chair using your arms (e.g., wheelchair or bedside chair)?: A Little Help needed to walk in hospital room?: Total Help needed climbing 3-5 steps with a railing? : Total 6 Click Score: 14    End of Session Equipment Utilized During Treatment: Oxygen Activity Tolerance: Other (comment)(patient limited by reported  dizziness) Patient left: in bed;with call bell/phone within reach;with bed alarm set Nurse Communication: Mobility status PT Visit Diagnosis: Unsteadiness on feet (R26.81);Muscle weakness (generalized) (M62.81);Difficulty in walking, not elsewhere classified (R26.2)     Time: 6394-3200 PT Time Calculation (min) (ACUTE ONLY): 18 min  Charges:  $Therapeutic Activity: 8-22 mins                     Doxie Augenstein, PT, GCS 01/22/19,11:10 AM

## 2019-01-22 NOTE — Progress Notes (Signed)
NAME:  [name], MRN:  [id number], DOB:  08-15-1931, LOS: 0 ADMISSION DATE:  01/19/2019, CONSULTATION DATE:  01/19/2019 REFERRING MD:  McDiarmid - [facility]MT[facility], CHIEF COMPLAINT:  SOB   Brief History   85 yo M who presented to ED 8/17 with from [facility] with CC SOB. Recently admitted with dyspnea, bilateral pleural effusions s/p bilateral thoracentesis 7/30, 7/31.   Past Medical History  CAD, s/p CABG MI Atrial Fibrillation on eliquis Aortic stenosis Carotid stenosis  CHF COPD on 2-4LNC GERD PUD Renal artery stenosis Sleep Apnea HTN HLD DM Depression  Significant Hospital Events   8/17 Admit with dyspnea. Admitted to [facility].    Consults:  PCCM  Procedures:    Significant Diagnostic Tests:  CXR 8/17 > LLL atelectasis. Bilateral pleural effusion. Cardiomegaly. Pulmonary vascular congestion. Possible interstitial edema   Micro Data:  SARS Cov2 > negative 8/17  Antimicrobials:  Vanc 8/17 >> 8/18 Zosyn 8/17 >> 8/18  Interim history/subjective:  -4098 overnight and doing well Subjective SOB continues    Objective    Vitals:   01/22/19 0002 01/22/19 0527 01/22/19 0756 01/22/19 1100  BP: (!) 104/48 126/63 (!) 114/55   Pulse: 79 91 96   Resp: 20 18 20    Temp: 98.3 F (36.8 C) 97.8 F (36.6 C) 97.7 F (36.5 C)   TempSrc: Oral Oral Oral   SpO2: 100% 100% 100% 98%  Weight:  79.8 kg    Height:       I/O last 3 completed shifts: In: 480 [P.O.:480] Out: 2800 [Urine:2800] Total I/O In: 120 [P.O.:120] Out: -    Examination: General: Pleasant elderly male, NAD HEENT: [place]/AT, PERRL, EOM-I and MMM Neuro: Alert and interactive moving all ext to command CV: RRR, Nl S1/S2 and -M/R/G PULM:  Bibasilar crackles GI: Soft, NT, ND and +BS Extremities: -edema and -tenderness Skin: no rashes or lesions  I reviewed CXR myself, pulmonary edema and pleural effusion noted  Resolved Hospital Problem list     Assessment & Plan:  85 year old male with PMH of  CHF, recent PE and 2-4L O2 dependent COPD admitted with SOB.  Patient was seen on 7/30 for pleural effusion and bilateral thoracentesis were completed but studies not sent for fluid analysis.  Discussed with PCCM-NP.  Bilateral Pleural Effusions -fluid appearance description would suggest transudative process -suspect secondary to CHF  P: Continue active diureses 80 of lasix BID Follow intermittent CXR  No thora for now, no pleurex otherwise will continue to drain for the foreseeable future  Chronic Hypoxemic Respiratory Failure  -baseline O2 2-4L  P: Continue O2 support  Wean for sat of 88-92%  COPD without Exacerbation P: Continue Incruse PRN xopenex   Doubt HCAP > Suspect Atelectasis -no WBC, PCT <1.1 -no indication of HCAP at this point P: Monitor WBC and fever curve Pulmonary hygiene - Flutter valve  IS and flutter valve  Pulmonary Edema P: Diuresis as above  Follow CXR  Monitor I/O Replace K as needed BMET  PCCM will sign off, please call back if needed.   Labs   CBC: Recent Labs  Lab 01/19/19 1408 01/21/19 0446 01/22/19 0601  WBC 8.0 7.6 8.1  NEUTROABS 5.5  --   --   HGB 9.8* 9.3* 9.4*  HCT 32.0* 29.4* 30.2*  MCV 87.9 85.7 85.3  PLT 421* 386 119    Basic Metabolic Panel: Recent Labs  Lab 01/19/19 1408 01/20/19 0023 01/21/19 0446 01/22/19 0601  NA 137 137 136 135  K 4.0 3.8 3.7 3.7  CL 94* 93* 92* 92*  CO2 31 33* 33* 33*  GLUCOSE 134* 116* 111* 112*  BUN 13 12 15 14   CREATININE 0.64 0.68 0.80 0.74  CALCIUM 8.8* 8.9 8.8* 8.9   GFR: Estimated Creatinine Clearance: 74.1 mL/min (by C-G formula based on SCr of 0.74 mg/dL). Recent Labs  Lab 01/19/19 1408 01/19/19 1840 01/21/19 0446 01/22/19 0601  PROCALCITON  --  <0.10  --   --   WBC 8.0  --  7.6 8.1    Liver Function Tests: Recent Labs  Lab 01/19/19 1408  AST 33  ALT 30  ALKPHOS 165*  BILITOT 0.7  PROT 5.8*  ALBUMIN 2.6*   No results for input(s): LIPASE, AMYLASE in the  last 168 hours. No results for input(s): AMMONIA in the last 168 hours.  ABG    Component Value Date/Time   PHART 7.567 (H) 01/01/2019 0740   PCO2ART 31.8 (L) 01/01/2019 0740   PO2ART 133 (H) 01/01/2019 0740   HCO3 29.3 (H) 01/01/2019 0740   TCO2 33 (H) 12/17/2018 1128   ACIDBASEDEF TEST WILL BE CREDITED 09/13/2017 1049   O2SAT 99.6 01/01/2019 0740     Coagulation Profile: Recent Labs  Lab 01/20/19 0023  INR 1.9*    Cardiac Enzymes: No results for input(s): CKTOTAL, CKMB, CKMBINDEX, TROPONINI in the last 168 hours.  HbA1C: Hgb A1c MFr Bld  Date/Time Value Ref Range Status  12/17/2018 04:23 PM 6.1 (H) 4.8 - 5.6 % Final    Comment:    (NOTE) Pre diabetes:          5.7%-6.4% Diabetes:              >6.4% Glycemic control for   <7.0% adults with diabetes   09/13/2017 10:50 AM 6.1 (H) 4.8 - 5.6 % Final    Comment:    (NOTE) Pre diabetes:          5.7%-6.4% Diabetes:              >6.4% Glycemic control for   <7.0% adults with diabetes     CBG: Recent Labs  Lab 01/22/19 0611  GLUCAP 109*   Rush Farmer, M.D. [facility] Pulmonary/Critical Care Medicine. Pager: [phone]. After hours pager: [phone].

## 2019-01-22 NOTE — Consult Note (Signed)
   [facility] CM Inpatient Consult   01/22/2019  [name] March 16, 1932 [id number]      Patient reviewedfor readmission within 30 days with 4 hospitalizations in the past 6 months; has 26% high risk score for unplanned readmission;  and to check for possible [facility] Management services needsunderhisMedicare/ NextGen insurance plan.  Patient was recently followed by [facility] post acute RN for potential [facility] care management services for community needs post discharge.  Medical record review and MDhistory & physicaldated 01/19/19, reveal as follows: [name] is an 85 y.o. male, presented to the ED on 8/17 from [facility] with shortness of breath. PMH is significant for CHF, atrial fibrillation, gout, type 2 diabetes, BPH, CAD, history of provoked PE,  2-4L O2 dependent COPD admitted for SOB. Patient was seen on 7/30 for pleural effusion and bilateral thoracentesis were completed.  CXR 8/17 > LLL atelectasis. Bilateral pleural effusion. Cardiomegaly. Pulmonary vascular congestion. Possible interstitial edema. Palliative care consult pending.  PT notes reviewed and recommending return to SNF at discharge for continued rehab, due to noted significant decline in his mobility that is limited by dyspnea, dizziness, and sore on bottom/ sacrum.   Transition of care SW note reviewed and states that patient was doing short term rehab at [facility] prior to admission, and is in agreement to return for continued rehab. [facility] is prepared to accept patient back once medically stable.  Pleaserefer to [facility] care managementifthere areany dispositionchangesor needs forfollow-upas appropriate.  Of note, [facility] Care Management services does not replace or interfere with any services that are arranged by transition of care case management or social work.  Will notify [facility] post acute RN coordinator of patient's disposition for appropriate post discharge follow upofneeds.    For questions and additional information, please call:  Daemion Mcniel A. Zharia Conrow, BSN, RN-BC [facility] Liaison Cell: [phone]

## 2019-01-22 NOTE — Progress Notes (Signed)
Progress Note  Patient Name: ISAAH FURRY Date of Encounter: 01/22/2019  Primary Cardiologist: Dr Stanford Breed  Subjective   No CP; dyspnea improving but persists; some dizziness with standing  Inpatient Medications    Scheduled Meds:  allopurinol  300 mg Oral Daily   apixaban  5 mg Oral BID   atorvastatin  80 mg Oral QHS   brimonidine  1 drop Both Eyes BID   ferrous sulfate  325 mg Oral Q breakfast   furosemide  80 mg Intravenous BID   Melatonin  6 mg Oral QHS   metoprolol tartrate  25 mg Oral BID   multivitamin with minerals  1 tablet Oral Daily   pantoprazole  40 mg Oral Daily   spironolactone  25 mg Oral Daily   tamsulosin  0.4 mg Oral QPM   umeclidinium bromide  1 puff Inhalation Daily   Continuous Infusions:  PRN Meds: acetaminophen **OR** acetaminophen, levalbuterol   Vital Signs    Vitals:   01/21/19 2111 01/22/19 0002 01/22/19 0527 01/22/19 0756  BP:  (!) 104/48 126/63 (!) 114/55  Pulse:  79 91 96  Resp:  20 18 20   Temp:  98.3 F (36.8 C) 97.8 F (36.6 C) 97.7 F (36.5 C)  TempSrc:  Oral Oral Oral  SpO2: 100% 100% 100% 100%  Weight:   79.8 kg   Height:        Intake/Output Summary (Last 24 hours) at 01/22/2019 0920 Last data filed at 01/22/2019 0805 Gross per 24 hour  Intake 120 ml  Output 2050 ml  Net -1930 ml   Last 3 Weights 01/22/2019 01/21/2019 01/20/2019  Weight (lbs) 175 lb 14.8 oz 177 lb 11.1 oz 175 lb 7.8 oz  Weight (kg) 79.8 kg 80.6 kg 79.6 kg      Telemetry    Atrial fibrillation- Personally Reviewed   Physical Exam   GEN: Elderly, NAD Neck: JVP 10 cm Cardiac: irregular, no murmur Respiratory: Diminished BS bases; no wheeze GI: Soft, NT/ND  MS: No edema Neuro:  Grossly intact Psych: Normal affect   Labs    High Sensitivity Troponin:   Recent Labs  Lab 12/31/18 1635 01/01/19 0128 01/01/19 0344 01/01/19 0605 01/20/19 0023  TROPONINIHS 34* 27* 26* 22* 26*      Chemistry Recent Labs  Lab  01/19/19 1408 01/20/19 0023 01/21/19 0446 01/22/19 0601  NA 137 137 136 135  K 4.0 3.8 3.7 3.7  CL 94* 93* 92* 92*  CO2 31 33* 33* 33*  GLUCOSE 134* 116* 111* 112*  BUN 13 12 15 14   CREATININE 0.64 0.68 0.80 0.74  CALCIUM 8.8* 8.9 8.8* 8.9  PROT 5.8*  --   --   --   ALBUMIN 2.6*  --   --   --   AST 33  --   --   --   ALT 30  --   --   --   ALKPHOS 165*  --   --   --   BILITOT 0.7  --   --   --   GFRNONAA >60 >60 >60 >60  GFRAA >60 >60 >60 >60  ANIONGAP 12 11 11 10      Hematology Recent Labs  Lab 01/19/19 1408 01/21/19 0446 01/22/19 0601  WBC 8.0 7.6 8.1  RBC 3.64* 3.43* 3.54*  HGB 9.8* 9.3* 9.4*  HCT 32.0* 29.4* 30.2*  MCV 87.9 85.7 85.3  MCH 26.9 27.1 26.6  MCHC 30.6 31.6 31.1  RDW 15.9* 15.7* 15.6*  PLT  421* 386 394    BNP Recent Labs  Lab 01/19/19 1408  BNP 222.4*      Radiology    Dg Chest Port 1 View  Result Date: 01/21/2019 CLINICAL DATA:  Shortness of breath, respiratory failure, atrial fibrillation, coronary artery disease post MI, COPD, CHF, diabetes mellitus, hypertension EXAM: PORTABLE CHEST 1 VIEW COMPARISON:  Portable exam 0657 hours compared to 01/20/2019 FINDINGS: Enlargement of cardiac silhouette post CABG and TAVR. Atherosclerotic calcification aorta. Pulmonary vascular congestion. Mild interstitial edema with bibasilar pleural effusions and basilar atelectasis, not significantly changed. No pneumothorax. Bones demineralized. IMPRESSION: Persistent CHF with bibasilar effusions and atelectasis, unchanged. Electronically Signed   By: Lavonia Dana M.D.   On: 01/21/2019 08:22    Patient Profile     85 year old male with past medical history of coronary artery disease status post coronary artery bypass graft, prior TAVR for aortic stenosis, permanent atrial fibrillation, mitral stenosis, COPD for evaluation of acute on chronic diastolic congestive heart failure.  Patient had TAVR April 2019.  Last echocardiogram July 2020 showed normal LV function,  severe left atrial enlargement, prior TAVR with mean gradient 11 mmHg, trivial aortic insufficiency, moderate to severe mitral stenosis with mean gradient 13 mmHg and mild to moderate mitral regurgitation, moderate tricuspid regurgitation.  Patient recently discharged following admission for CHF.  Had bilateral thoracenteses at that time but fluid was not sent for studies by report.  Since discharge he notes progressive dyspnea on exertion, orthopnea and increased abdominal girth.   Assessment & Plan    1 acute on chronic diastolic congestive heart failure-Wt 79.8 kg; I/O -1810.  Continues to show slow improvement.  Would continue Lasix 80 mg IV twice daily and follow renal function.  Continue to follow blood pressure closely.  There is likely a contribution from mitral stenosis and low albumin.    2 mitral valve disease-moderate to severe mitral stenosis and mild to moderate mitral regurgitation.  Patient would not be a candidate for mitral valve surgery.  3 permanent atrial fibrillation-we will continue with metoprolol for rate control.  If blood pressure becomes an issue it might be worth trying to control weight with digoxin or amiodarone and decrease metoprolol.  Continue apixaban 5 mg twice daily. CHADSvasc 6.  4 status post TAVR-last echocardiogram showed normally functioning valve.  5 COPD-continue pulmonary toilet per primary care.  6 coronary artery disease-no chest pain.  Continue statin.  No aspirin given need for anticoagulation.  For questions or updates, please contact [place] Please consult [email or web address] for contact info under        Signed, Kirk Ruths, MD  01/22/2019, 9:20 AM

## 2019-01-22 NOTE — Progress Notes (Signed)
CSW has informed [facility] to anticipate patient discharge back to their facility in 1-2 days. They are prepared to accept him back when medically ready.   [place], [place]

## 2019-01-22 NOTE — Progress Notes (Signed)
Family Medicine Teaching Service Daily Progress Note Intern Pager: [phone]  Patient name: [name] Medical record number: [id number] Date of birth: 1931-10-25 Age: 85 y.o. Gender: male  Primary Care Provider: Nicoletta Dress, MD Consultants: CCM Code Status: Full   Pt Overview and Major Events to Date:  Admitted 08/17.   Assessment and Plan: [name] is a 85 y.o. male presenting with shortness of breath. PMH is significant for CHF, atrial fibrillation, gout, type 2 diabetes, BPH, CAD, history of provoked PE.  SOB 2/2 B/L Pulmonary Effusions  On 3 L of nasal cannula saturation at 100% -telemetry - strict BR -PT /OT with improved dyspnea  -Lasix 80mg  twice daily - Monitor Cr (today 0.74) with diuresis   Acute on Chronic HFpEF Some improvement but is still dyspneic rest.  Likely contributed to mitral stenosis.  Stenosis is moderate to severe with regurgitation not a good candidate for mitral valve surgery per cardiology. -Cardiac monitoring -Spironolactone 25 mg daily -Hold home PO lasix of 40 mg - IV lasix 80 twice daily - continue home metoprolol titrate 25 mg BID - hold spironolactone 25mg  given IV lasix load  COPD  Received a breathing treatment this morning. Denies chest pain or chest tightness. -Symbicort 80-4 0.5 MCG -Levalbuterol 0.3 mg per 3 mL every 6 PRN -Incruse Ellipta 82.5 mcg 1 puff daily; will hold  Persistent atrial fibrillation, on eliquis Chronic and rate controlled. He is not tachycardic heart rate overnight 70-90s. -Continue Eliquis 5 mg twice daily -Continue metoprolol tartrate 25mg  twice daily -Telemetry -INR is 1.9   IDDM, well controlled Blood glucose today is 112.  A1c 6.1 7/15 2020. - holding home AM sliding scale  - hold home metformin  - goal CBG 140-180.  - page MD with CBG > 180 for insulin instruction  - AM BMP   GERD - substitute formulary Protonix 40 mg daily  BPH Reports no urinary symptoms as he has an  indwelling catheter currently. -Continue tamsulosin 0.4 mg daily - will need f/u at urology for cath (recommended at last discharge) - check to see if patient went to appt.   Asymptomatic Bacteruria  Hemoglobinuria Patient with positive nitrites, bacteria, leuks on initial UA. WBC clumps, mucus, hyaline casts, amorphous crystal and triple phosphate crystal present as well. PH 9. > 50 RBC  CAD  HLD  Last lipid panel in 2014. No repeat at this time - Continue atorvastatin 80 mg at bedtime  Hypertension Normotensive. - hold benazepril 5 mg daily   Constipation  hold home Dulcolax 5 mg daily & Polyethylene glycol 17 g twice daily given complaints of diarrhea recently  - re dose if no BM x 1 day   Chronic anemia Hemoglobin is 9.4 (9.8 at baseline), MCV 85.3. On home iron supplementation daily; however, no iron panel in chart.  - ferritin 111 - continue Ferrous sulfate 325 mg for now  Chronic back pain At home, on Hydrocodone acetaminophen 5 325 mg every 4 PRN; will hold in setting of dyspnea - please notify MD for pain  - tylenol PRN   Gout - Continue allopurinol 300mg  daily  Pressure Ulcers, sacrum  - daily wound care  - foam dressing to sacrum daily  - q2 hour bed shifting and readjustment   Goals of care - Continue to diurese and consider thoracentesis if necessary -Palliative at bed side today   FEN/GI: Modified carb diet, Protonix 40 mg PPx: Not necessary patient already on Eliquis  Disposition: Return to [facility]  pending improved dyspnea  Subjective: He is dyspneic with conversation.  Recently returned ambulating from bathroom to bed.  Denies chest pain chest tightness.  Objective: Temp:  [97.7 F (36.5 C)-98.3 F (36.8 C)] 97.7 F (36.5 C) (08/20 0756) Pulse Rate:  [79-97] 96 (08/20 0756) Resp:  [18-20] 20 (08/20 0756) BP: (104-126)/(48-63) 114/55 (08/20 0756) SpO2:  [100 %] 100 % (08/20 0756) Weight:  [79.8 kg] 79.8 kg (08/20 0527)  Physical  Exam: General: Appears well, no acute distress. Age appropriate. Cardiac: RRR, normal heart sounds, no murmurs Respiratory: CTAB, normal effort Abdomen: soft, nontender, nondistended Extremities: No edema or cyanosis. Skin: Warm and dry, no rashes noted Neuro: alert and oriented, no focal deficits Psych: normal affect   Laboratory: Recent Labs  Lab 01/19/19 1408 01/21/19 0446 01/22/19 0601  WBC 8.0 7.6 8.1  HGB 9.8* 9.3* 9.4*  HCT 32.0* 29.4* 30.2*  PLT 421* 386 394   Recent Labs  Lab 01/19/19 1408 01/20/19 0023 01/21/19 0446 01/22/19 0601  NA 137 137 136 135  K 4.0 3.8 3.7 3.7  CL 94* 93* 92* 92*  CO2 31 33* 33* 33*  BUN 13 12 15 14   CREATININE 0.64 0.68 0.80 0.74  CALCIUM 8.8* 8.9 8.8* 8.9  PROT 5.8*  --   --   --   BILITOT 0.7  --   --   --   ALKPHOS 165*  --   --   --   ALT 30  --   --   --   AST 33  --   --   --   GLUCOSE 134* 116* 111* 112*      Imaging/Diagnostic Tests:  PORTABLE CHEST 1 VIEW Result date: 01/21/2019.  COMPARISON:  01/20/2019 IMPRESSION: Persistent CHF with bibasilar effusions and atelectasis, unchanged.   Gerlene Fee, DO 01/22/2019, 7:59 AM PGY-1, [place] Intern pager: [phone], text pages welcome

## 2019-01-22 NOTE — Care Management Important Message (Signed)
Important Message  Patient Details  Name: [name] MRN: [id number] Date of Birth: Sep 29, 1931   Medicare Important Message Given:  Yes     [name] 01/22/2019, 1:08 PM

## 2019-01-22 NOTE — Progress Notes (Addendum)
Palliative:  HPI: 85 y.o. male  with past medical history of CHF (EF 50-55%), mod/severe mitral stenosis, s/p TAVR, atrial fibrillation, recent PE, on anticoagulation, chronic 3L oxygen, COPD, HTN, recent left forearm infection requiring surgical debridement admitted on 01/19/2019 with worsening dyspnea with pulmonary edema and recurrent bilateral pleural effusions thought to be from CHF complicated by mod-severe mitral stenosis and mild-mod mitral regurg. He has had minimal improvement in symptoms with diuresis.   I met today with [name] and his wife. He was working with therapy and sitting on side of bed and he is SOB and becoming very dizzy. He requests to lie back down after a few minutes. He remains dizzy for some time even after lying down. His wife is at bedside and trying to encourage him to continue trying with therapy to regain strength. He appears somewhat frustrated and says that he is trying the best he is able.   I spoke privately with wife while [name] was having PCXR done. She is aware and even tells [name] that he will continue to have these episodes and his condition is not reversible and will not really improve. We discussed consideration of his goals and QOL considering disease trajectory (she is a Marine scientist and understands). She understands that there will likely be a need for hospice and is open to this, They have not discussed this yet.   We all spoke further and I spoke more about code status and the importance of putting DNR in place prior to decline if this is his wishes. I made copy of Living Will and placed on shadow chart. They seem to understand the limited benefit of resuscitation but are hesitant to put this in place at this time. I believe they just need more time to observe his progress. Would benefit from outpatient palliative follow up.   Exam: Alert, oriented. SOB at rest with purse lipped breathing. Abd flat. Generalized weakness. Movement limited by  dizziness.   Plan: - Would benefit from outpatient palliative to follow.  - Hard Choices booklet given to wife.   46 min  [name] Sill, NP Palliative Medicine Team Pager [phone] (Please see [email or web address] for schedule) Team Phone [phone]    Greater than 50%  of this time was spent counseling and coordinating care related to the above assessment and plan

## 2019-01-23 ENCOUNTER — Inpatient Hospital Stay (HOSPITAL_COMMUNITY): Admission: RE | Admit: 2019-01-23 | Payer: Medicare Other | Source: Ambulatory Visit

## 2019-01-23 LAB — BASIC METABOLIC PANEL
Anion gap: 10 (ref 5–15)
BUN: 13 mg/dL (ref 8–23)
CO2: 33 mmol/L — ABNORMAL HIGH (ref 22–32)
Calcium: 8.7 mg/dL — ABNORMAL LOW (ref 8.9–10.3)
Chloride: 90 mmol/L — ABNORMAL LOW (ref 98–111)
Creatinine, Ser: 0.82 mg/dL (ref 0.61–1.24)
GFR calc Af Amer: >60 mL/min (ref >60)
GFR calc non Af Amer: >60 mL/min (ref >60)
Glucose, Bld: 115 mg/dL — ABNORMAL HIGH (ref 70–99)
Potassium: 3.7 mmol/L (ref 3.5–5.1)
Sodium: 133 mmol/L — ABNORMAL LOW (ref 135–145)

## 2019-01-23 LAB — GLUCOSE, CAPILLARY: Glucose-Capillary: 153 mg/dL — ABNORMAL HIGH (ref 70–99)

## 2019-01-23 LAB — MAGNESIUM: Magnesium: 1.7 mg/dL (ref 1.7–2.4)

## 2019-01-23 MED ORDER — POTASSIUM CHLORIDE CRYS ER 20 MEQ PO TBCR
30.0000 meq | EXTENDED_RELEASE_TABLET | Freq: Two times a day (BID) | ORAL | Status: DC
  Administered 2019-01-23 – 2019-01-28 (×11): 30 meq via ORAL
  Filled 2019-01-23 (×11): qty 1

## 2019-01-23 MED ORDER — DIGOXIN 125 MCG PO TABS
0.1250 mg | ORAL_TABLET | Freq: Every day | ORAL | Status: DC
  Administered 2019-01-23 – 2019-01-28 (×6): 0.125 mg via ORAL
  Filled 2019-01-23 (×6): qty 1

## 2019-01-23 NOTE — Progress Notes (Signed)
Progress Note  Patient Name: ARNOL MCGIBBON Date of Encounter: 01/23/2019  Primary Cardiologist: Dr Stanford Breed  Subjective   Dyspnea improving; no CP  Inpatient Medications    Scheduled Meds:  allopurinol  300 mg Oral Daily   apixaban  5 mg Oral BID   atorvastatin  80 mg Oral QHS   brimonidine  1 drop Both Eyes BID   ferrous sulfate  325 mg Oral Q breakfast   furosemide  80 mg Intravenous BID   levalbuterol  0.63 mg Nebulization TID   Melatonin  6 mg Oral QHS   metoprolol tartrate  25 mg Oral BID   multivitamin with minerals  1 tablet Oral Daily   pantoprazole  40 mg Oral Daily   potassium chloride  30 mEq Oral BID   spironolactone  25 mg Oral Daily   tamsulosin  0.4 mg Oral QPM   umeclidinium bromide  1 puff Inhalation Daily   Continuous Infusions:  PRN Meds: acetaminophen **OR** acetaminophen, HYDROcodone-acetaminophen   Vital Signs    Vitals:   01/23/19 0350 01/23/19 0809 01/23/19 0813 01/23/19 0848  BP: (!) 92/38 120/75  (!) 119/54  Pulse: 91 (!) 104  (!) 101  Resp: 18 17  20   Temp: 98.2 F (36.8 C) 98.3 F (36.8 C)  98.2 F (36.8 C)  TempSrc: Oral Oral  Oral  SpO2: 99% 100% 98% 99%  Weight: 76.9 kg     Height:        Intake/Output Summary (Last 24 hours) at 01/23/2019 1019 Last data filed at 01/23/2019 0841 Gross per 24 hour  Intake 1200 ml  Output 2075 ml  Net -875 ml   Last 3 Weights 01/23/2019 01/22/2019 01/21/2019  Weight (lbs) 169 lb 8 oz 175 lb 14.8 oz 177 lb 11.1 oz  Weight (kg) 76.885 kg 79.8 kg 80.6 kg      Telemetry    Atrial fibrillation, rate controlled- Personally Reviewed   Physical Exam   GEN: Elderly, WD, NAD Neck: supple Cardiac: irregular, no gallop Respiratory: Diminished BS bases; no rhonchi GI: Soft, NT/ND, no masses MS: No edema Neuro:  no focal findings Psych: Normal affect   Labs    High Sensitivity Troponin:   Recent Labs  Lab 12/31/18 1635 01/01/19 0128 01/01/19 0344 01/01/19 0605  01/20/19 0023  TROPONINIHS 34* 27* 26* 22* 26*      Chemistry Recent Labs  Lab 01/19/19 1408  01/21/19 0446 01/22/19 0601 01/23/19 0333  NA 137   < > 136 135 133*  K 4.0   < > 3.7 3.7 3.7  CL 94*   < > 92* 92* 90*  CO2 31   < > 33* 33* 33*  GLUCOSE 134*   < > 111* 112* 115*  BUN 13   < > 15 14 13   CREATININE 0.64   < > 0.80 0.74 0.82  CALCIUM 8.8*   < > 8.8* 8.9 8.7*  PROT 5.8*  --   --   --   --   ALBUMIN 2.6*  --   --   --   --   AST 33  --   --   --   --   ALT 30  --   --   --   --   ALKPHOS 165*  --   --   --   --   BILITOT 0.7  --   --   --   --   GFRNONAA >60   < > >60 >  60 >60  GFRAA >60   < > >60 >60 >60  ANIONGAP 12   < > 11 10 10    < > = values in this interval not displayed.     Hematology Recent Labs  Lab 01/19/19 1408 01/21/19 0446 01/22/19 0601  WBC 8.0 7.6 8.1  RBC 3.64* 3.43* 3.54*  HGB 9.8* 9.3* 9.4*  HCT 32.0* 29.4* 30.2*  MCV 87.9 85.7 85.3  MCH 26.9 27.1 26.6  MCHC 30.6 31.6 31.1  RDW 15.9* 15.7* 15.6*  PLT 421* 386 394    BNP Recent Labs  Lab 01/19/19 1408  BNP 222.4*      Radiology    Dg Chest Port 1 View  Result Date: 01/22/2019 CLINICAL DATA:  Respiratory failure EXAM: PORTABLE CHEST 1 VIEW COMPARISON:  01/21/2019 FINDINGS: Prior median sternotomy and valve replacement and CABG. Cardiomegaly with vascular congestion. Bibasilar atelectasis or infiltrates with small effusions. No real change since prior study. IMPRESSION: Cardiomegaly, vascular congestion. Small bilateral effusions with bibasilar atelectasis or infiltrates. No change. Electronically Signed   By: Rolm Baptise M.D.   On: 01/22/2019 11:36    Patient Profile     85 year old male with past medical history of coronary artery disease status post coronary artery bypass graft, prior TAVR for aortic stenosis, permanent atrial fibrillation, mitral stenosis, COPD for evaluation of acute on chronic diastolic congestive heart failure.  Patient had TAVR April 2019.  Last  echocardiogram July 2020 showed normal LV function, severe left atrial enlargement, prior TAVR with mean gradient 11 mmHg, trivial aortic insufficiency, moderate to severe mitral stenosis with mean gradient 13 mmHg and mild to moderate mitral regurgitation, moderate tricuspid regurgitation.  Patient recently discharged following admission for CHF.  Had bilateral thoracenteses at that time but fluid was not sent for studies by report.  Since discharge he notes progressive dyspnea on exertion, orthopnea and increased abdominal girth.   Assessment & Plan    1 acute on chronic diastolic congestive heart failure-Wt 76.9 kg; I/O -1235.  Patient continues with slow improvement.  We will continue with present dose of Lasix and follow renal function.  CHF is felt secondary to combination of diastolic congestive heart failure, mitral stenosis and low oncotic pressure from decreased albumin.    2 mitral valve disease-moderate to severe mitral stenosis and mild to moderate mitral regurgitation.  Patient would not be a candidate for mitral valve surgery.  3 permanent atrial fibrillation-patient is having dizziness with standing.  I will discontinue metoprolol to allow blood pressure to run higher.  Instead we will treat with digoxin to control his heart rate.  Could consider amiodarone if heart rate is not controlled with digoxin.  Continue apixaban.  CHADSvasc 6.  4 status post TAVR-last echocardiogram showed normally functioning valve.  5 COPD-continue pulmonary toilet per primary care.  6 coronary artery disease-no chest pain.  Continue statin.  No aspirin given need for anticoagulation.  For questions or updates, please contact [place] Please consult [email or web address] for contact info under        Signed, Kirk Ruths, MD  01/23/2019, 10:19 AM

## 2019-01-23 NOTE — Progress Notes (Addendum)
Family Medicine Teaching Service Daily Progress Note Intern Pager: [phone]  Patient name: [name] Medical record number: [id number] Date of birth: 04/13/1932 Age: 85 y.o. Gender: male  Primary Care Provider: Nicoletta Dress, MD Consultants: CCM Code Status: Full   Pt Overview and Major Events to Date:  Admitted 08/17.   Assessment and Plan: [name] is a 85 y.o. male presenting with shortness of breath. PMH is significant for CHF, atrial fibrillation, gout, type 2 diabetes, BPH, CAD, history of provoked PE.  SOB 2/2 B/L Pulmonary Effusions   On 3 L of nasal cannula saturation at 98-100%. 2 L output. -telemetry - strict BR -PT /OT with improved dyspnea  -Lasix 80mg  twice daily - Monitor Cr (today 0.82) with diuresis   Acute on Chronic HFpEF Some improvement but is still dyspneic rest.  Likely contributed to mitral stenosis.  Stenosis is moderate to severe with regurgitation not a good candidate for mitral valve surgery per cardiology. - K+ 3.7 , 44mEq PO K-DUR PO, Mg level pending -Cardiac monitoring -Spironolactone 25 mg daily - IV lasix 80 twice daily - continue home metoprolol titrate 25 mg BID   COPD  Received a breathing treatment this morning. Denies chest pain or chest tightness. -Symbicort 80-4 0.5 MCG -Levalbuterol 0.3 mg per 3 mL every 6 PRN -Incruse Ellipta 82.5 mcg 1 puff daily; will hold  Persistent atrial fibrillation, on eliquis Chronic and rate controlled. He is mildly tachycardic heart rate overnight 91-100s. -Continue Eliquis 5 mg twice daily - Hold metoprolol tartrate 25mg  twice daily, start digoxin 0.125mg  -Telemetry -INR is 1.9   IDDM, well controlled Blood glucose today is 115.  A1c 6.1 7/15 2020. - holding home AM sliding scale  - hold home metformin  - goal CBG 140-180.  - page MD with CBG > 180 for insulin instruction  - AM BMP   GERD - substitute formulary Protonix 40 mg daily  BPH Reports no urinary symptoms  as he has an indwelling catheter currently. 2 L output yesterday -Continue tamsulosin 0.4 mg daily - will need f/u at urology for cath (recommended at last discharge) - check to see if patient went to appt.   Asymptomatic Bacteruria  Hemoglobinuria Patient with positive nitrites, bacteria, leuks on initial UA. WBC clumps, mucus, hyaline casts, amorphous crystal and triple phosphate crystal present as well. PH 9. > 50 RBC  CAD  HLD  Last lipid panel in 2014. No repeat at this time - Continue atorvastatin 80 mg at bedtime  Hypertension Hypotensive overnight systolic blood pressures ranging from 62-863, diastolic blood pressures ranging from 38-66.  With a mean arterial pressure 55. - hold benazepril 5 mg daily  - metoprolol once daily at home, holding for now per cardiology  Constipation  hold home Dulcolax 5 mg daily & Polyethylene glycol 17 g twice daily given complaints of diarrhea recently  - re dose if no BM x 1 day   Chronic anemia Hemoglobin is 9.4 (9.8 at baseline), MCV 85.3. On home iron supplementation daily  - ferritin 111 - continue Ferrous sulfate 325 mg for now  Chronic back pain At home, on Hydrocodone acetaminophen 5 325 mg every 4 PRN; will hold in setting of dyspnea - please notify MD for pain  - tylenol PRN   Gout - Continue allopurinol 300mg  daily  Pressure Ulcers, sacrum  - daily wound care  - foam dressing to sacrum daily  - q2 hour bed shifting and readjustment   Goals of care -  Continue to diurese and consider thoracentesis if necessary - Palliative benefit for outpatient palliative care   FEN/GI: Modified carb diet, Protonix 40 mg PPx: Not necessary patient already on Eliquis  Disposition: Return to [facility] pending Cardiology's recommendation on time of discharge.   Subjective: He says he is feeling better this morning than yesterday morning.  He is resting in bed after his breathing treatment.  Objective: Temp:  [97.5 F (36.4  C)-98.2 F (36.8 C)] 98.2 F (36.8 C) (08/21 0350) Pulse Rate:  [91-100] 91 (08/21 0350) Resp:  [18-20] 18 (08/21 0350) BP: (92-129)/(38-66) 92/38 (08/21 0350) SpO2:  [98 %-100 %] 99 % (08/21 0350) Weight:  [76.9 kg] 76.9 kg (08/21 0350)  Physical Exam: General: Appears well, no acute distress. Age appropriate. Cardiac: RRR, normal heart sounds, no murmurs Respiratory: CTAB, normal effort Abdomen: soft, nontender, nondistended Extremities: No edema or cyanosis. Skin: Warm and dry, no rashes noted Neuro: alert and oriented, no focal deficits Psych: normal affect   Laboratory: Recent Labs  Lab 01/19/19 1408 01/21/19 0446 01/22/19 0601  WBC 8.0 7.6 8.1  HGB 9.8* 9.3* 9.4*  HCT 32.0* 29.4* 30.2*  PLT 421* 386 394   Recent Labs  Lab 01/19/19 1408  01/21/19 0446 01/22/19 0601 01/23/19 0333  NA 137   < > 136 135 133*  K 4.0   < > 3.7 3.7 3.7  CL 94*   < > 92* 92* 90*  CO2 31   < > 33* 33* 33*  BUN 13   < > 15 14 13   CREATININE 0.64   < > 0.80 0.74 0.82  CALCIUM 8.8*   < > 8.8* 8.9 8.7*  PROT 5.8*  --   --   --   --   BILITOT 0.7  --   --   --   --   ALKPHOS 165*  --   --   --   --   ALT 30  --   --   --   --   AST 33  --   --   --   --   GLUCOSE 134*   < > 111* 112* 115*   < > = values in this interval not displayed.      Imaging/Diagnostic Tests: PORTABLE CHEST 1 VIEW result date 01/22/2019 COMPARISON:  01/21/2019 FINDINGS: Prior median sternotomy and valve replacement and CABG. Cardiomegaly with vascular congestion. Bibasilar atelectasis or infiltrates with small effusions. No real change since prior study. IMPRESSION: Cardiomegaly, vascular congestion. Small bilateral effusions with bibasilar atelectasis or infiltrates. No change.     Gerlene Fee, DO 01/23/2019, 2:06 PM PGY-1, [facility] Intern pager: [phone], text pages welcome

## 2019-01-23 NOTE — Plan of Care (Signed)
  Problem: Education: Goal: Knowledge of General Education information will improve Description: Including pain rating scale, medication(s)/side effects and non-pharmacologic comfort measures Outcome: Progressing   Problem: Health Behavior/Discharge Planning: Goal: Ability to manage health-related needs will improve Outcome: Progressing   Problem: Clinical Measurements: Goal: Ability to maintain clinical measurements within normal limits will improve Outcome: Progressing Goal: Will remain free from infection Outcome: Progressing Goal: Diagnostic test results will improve Outcome: Progressing Goal: Respiratory complications will improve Outcome: Progressing Goal: Cardiovascular complication will be avoided Outcome: Progressing   Problem: Nutrition: Goal: Adequate nutrition will be maintained Outcome: Progressing   Problem: Elimination: Goal: Will not experience complications related to bowel motility Outcome: Progressing Goal: Will not experience complications related to urinary retention Outcome: Progressing   Problem: Pain Managment: Goal: General experience of comfort will improve Outcome: Progressing   Problem: Skin Integrity: Goal: Risk for impaired skin integrity will decrease Outcome: Progressing

## 2019-01-24 DIAGNOSIS — I050 Rheumatic mitral stenosis: Secondary | ICD-10-CM

## 2019-01-24 DIAGNOSIS — I4821 Permanent atrial fibrillation: Secondary | ICD-10-CM

## 2019-01-24 LAB — BASIC METABOLIC PANEL
Anion gap: 12 (ref 5–15)
BUN: 14 mg/dL (ref 8–23)
CO2: 32 mmol/L (ref 22–32)
Calcium: 8.8 mg/dL — ABNORMAL LOW (ref 8.9–10.3)
Chloride: 90 mmol/L — ABNORMAL LOW (ref 98–111)
Creatinine, Ser: 0.76 mg/dL (ref 0.61–1.24)
GFR calc Af Amer: >60 mL/min (ref >60)
GFR calc non Af Amer: >60 mL/min (ref >60)
Glucose, Bld: 103 mg/dL — ABNORMAL HIGH (ref 70–99)
Potassium: 3.9 mmol/L (ref 3.5–5.1)
Sodium: 134 mmol/L — ABNORMAL LOW (ref 135–145)

## 2019-01-24 LAB — GLUCOSE, CAPILLARY
Glucose-Capillary: 161 mg/dL — ABNORMAL HIGH (ref 70–99)
Glucose-Capillary: 155 mg/dL — ABNORMAL HIGH (ref 70–99)

## 2019-01-24 NOTE — Progress Notes (Signed)
Progress Note  Patient Name: [name] Date of Encounter: 01/24/2019  Primary Cardiologist: Kirk Ruths, MD  Subjective   Breathing has gradually improved.  No active chest pain or palpitations.  Felt a little lightheaded when getting up yesterday.  No leg swelling.  Inpatient Medications    Scheduled Meds: . allopurinol  300 mg Oral Daily  . apixaban  5 mg Oral BID  . atorvastatin  80 mg Oral QHS  . brimonidine  1 drop Both Eyes BID  . digoxin  0.125 mg Oral Daily  . ferrous sulfate  325 mg Oral Q breakfast  . furosemide  80 mg Intravenous BID  . levalbuterol  0.63 mg Nebulization TID  . Melatonin  6 mg Oral QHS  . multivitamin with minerals  1 tablet Oral Daily  . pantoprazole  40 mg Oral Daily  . potassium chloride  30 mEq Oral BID  . spironolactone  25 mg Oral Daily  . tamsulosin  0.4 mg Oral QPM  . umeclidinium bromide  1 puff Inhalation Daily    PRN Meds: acetaminophen **OR** acetaminophen, HYDROcodone-acetaminophen   Vital Signs    Vitals:   01/23/19 1618 01/23/19 1951 01/23/19 2033 01/24/19 0357  BP: (!) 105/58 125/63  136/78  Pulse: 87 95  87  Resp: 16 20  18   Temp: 98.3 F (36.8 C) 98 F (36.7 C)  97.6 F (36.4 C)  TempSrc: Oral Oral  Oral  SpO2: 100% 100% 99% 100%  Weight:    77 kg  Height:        Intake/Output Summary (Last 24 hours) at 01/24/2019 0748 Last data filed at 01/24/2019 0653 Gross per 24 hour  Intake 1137 ml  Output 2200 ml  Net -1063 ml   Filed Weights   01/22/19 0527 01/23/19 0350 01/24/19 0357  Weight: 79.8 kg 76.9 kg 77 kg    Telemetry    Atrial fibrillation.  Personally reviewed.  ECG    An ECG dated 01/22/2019 was personally reviewed today and demonstrated:  Atrial fibrillation with left bundle branch block.  Personally reviewed.  Physical Exam   GEN:  Elderly male, eating breakfast.  No acute distress.   Neck: No JVD. Cardiac:  Irregularly irregular, soft systolic murmur, no gallop.  Respiratory:  Nonlabored.  Decreased breath sounds at the bases. GI: Soft, nontender, bowel sounds present. MS: No edema; No deformity. Neuro:  Nonfocal. Psych: Alert and oriented x 3. Normal affect.  Labs    Chemistry Recent Labs  Lab 01/19/19 1408  01/22/19 0601 01/23/19 0333 01/24/19 0218  NA 137   < > 135 133* 134*  K 4.0   < > 3.7 3.7 3.9  CL 94*   < > 92* 90* 90*  CO2 31   < > 33* 33* 32  GLUCOSE 134*   < > 112* 115* 103*  BUN 13   < > 14 13 14   CREATININE 0.64   < > 0.74 0.82 0.76  CALCIUM 8.8*   < > 8.9 8.7* 8.8*  PROT 5.8*  --   --   --   --   ALBUMIN 2.6*  --   --   --   --   AST 33  --   --   --   --   ALT 30  --   --   --   --   ALKPHOS 165*  --   --   --   --   BILITOT 0.7  --   --   --   --  GFRNONAA >60   < > >60 >60 >60  GFRAA >60   < > >60 >60 >60  ANIONGAP 12   < > 10 10 12    < > = values in this interval not displayed.     Hematology Recent Labs  Lab 01/19/19 1408 01/21/19 0446 01/22/19 0601  WBC 8.0 7.6 8.1  RBC 3.64* 3.43* 3.54*  HGB 9.8* 9.3* 9.4*  HCT 32.0* 29.4* 30.2*  MCV 87.9 85.7 85.3  MCH 26.9 27.1 26.6  MCHC 30.6 31.6 31.1  RDW 15.9* 15.7* 15.6*  PLT 421* 386 394    Cardiac Enzymes Recent Labs  Lab 12/31/18 1635 01/01/19 0128 01/01/19 0344 01/01/19 0605 01/20/19 0023  TROPONINIHS 34* 27* 26* 22* 26*    BNP Recent Labs  Lab 01/19/19 1408  BNP 222.4*     Radiology    Dg Chest Port 1 View  Result Date: 01/22/2019 CLINICAL DATA:  Respiratory failure EXAM: PORTABLE CHEST 1 VIEW COMPARISON:  01/21/2019 FINDINGS: Prior median sternotomy and valve replacement and CABG. Cardiomegaly with vascular congestion. Bibasilar atelectasis or infiltrates with small effusions. No real change since prior study. IMPRESSION: Cardiomegaly, vascular congestion. Small bilateral effusions with bibasilar atelectasis or infiltrates. No change. Electronically Signed   By: Rolm Baptise M.D.   On: 01/22/2019 11:36    Cardiac Studies   Echocardiogram  01/01/2019: 1. The left ventricle has low normal systolic function, with an ejection fraction of 50-55%. The cavity size was normal. There is mildly increased left ventricular wall thickness. Left ventricular diastolic Doppler parameters are indeterminate. There is  abnormal septal motion consistent with post-operative status.  2. The right ventricle has mildly reduced systolic function. The cavity was normal. There is no increase in right ventricular wall thickness. Right ventricular systolic pressure is moderately elevated with an estimated pressure of 55.2 mmHg.  3. Left atrial size was severely dilated.  4. A 26 Edwards Sapien bioprosthetic aortic valve (TAVR) valve is present in the aortic position. Procedure Date: 09/17/17 Echo findings shows no evidence of rocking, dehiscence. Mean systolic gradient 11 mmHg, calculated valve area approximately 2 cm2  using LVOT diameter of 2.6 cm and LVOT TVI 14 cm. Likely trivial prosthetic aortic valve regurgitation, though challenging to assess whether this is prosthetic or paravalvular due to image quality.  5. The mitral valve is abnormal. There is moderate mitral annular calcification present. Mitral valve regurgitation is mild to moderate by color flow Doppler. Moderate-severe mitral valve stenosis. Mean diastolic gradient 13 mmHg at HR 100-105.  6. Tricuspid valve regurgitation is moderate.  7. When compared to the prior study: 11/24/2018 - no significant change in LV function. No change in degree of mitral valve stenosis. Stable function of aortic prosthesis. Side by side comparison of images performed.  Patient Profile     85 y.o. male with past medical history of coronary artery disease status post coronary artery bypass graft, prior TAVR for aortic stenosis, permanent atrial fibrillation, mitral stenosis, and COPD presenting with acute on chronic diastolic congestive heart failure.  Assessment & Plan    1.  Acute on chronic diastolic heart failure,  LVEF 50 to 55% range.  He continues on IV Lasix at 80 mg twice daily with good diuresis, he has had an additional 1000 cc out more than in last 24 hours.  Renal function stable.  Weight is down about 10 pounds.  2.  Moderate to severe mitral stenosis with mild to moderate mitral regurgitation.  Patient is not a surgical candidate.  3.  Aortic stenosis status post TAVR with stable prosthetic function by echocardiogram in July.  4.  Permanent atrial fibrillation managed with strategy of heart rate control and anticoagulation.  He is on Eliquis and was recently changed from metoprolol to Lanoxin by Dr. Stanford Breed.  5.  CAD status post CABG, no active angina symptoms.  Continue current IV Lasix dose and follow-up BMET in a.m.  Not certain whether Lanoxin will control heart rate adequately or not as activity increases, will continue to follow on telemetry.  He might be able to tolerate a lower dose of Lopressor plus Lanoxin.  Signed, Rozann Lesches, MD  01/24/2019, 7:48 AM

## 2019-01-24 NOTE — Progress Notes (Addendum)
Family Medicine Teaching Service Daily Progress Note Intern Pager: [phone]  Patient name: [name] Medical record number: [id number] Date of birth: [birth year] Age: 85 y.o. Gender: male  Primary Care Provider: Nicoletta Dress, MD Consultants: CCM Code Status: Full    Pt Overview and Major Events to Date:  Admitted 08/17   Assessment and Plan: MYERS TUTTEROW is a 85 y.o. male presenting with shortness of breath. PMH is significant for CHF, atrial fibrillation, gout, type 2 diabetes, BPH, CAD, history of provoked PE.   SOB 2/2 B/L Pulmonary Effusions   On 3 L nasal cannula saturation at 100%. Patient reports that his SOB is resolved and that he is breathing comfortably on his [place]. Continue with diuresis and optimization of respiratory status.  -telemetry -strict BR -Continue strict I's and O's -PT /OT with improved dyspnea  -Continue IV Lasix 80mg  twice daily -continue to monitor creatine with BMP    Acute on Chronic HFpEF Patient reports that his SOB is resolved.  Likely contributed to mitral stenosis.  Stenosis is moderate to severe with regurgitation not a good candidate for mitral valve surgery per cardiology.  1.7L output. Net neg 613 overnight and net neg 6.9 liters since admission  Weight 77 kg from 76.9 kg previously Cr currently 0.76 from 0.82 previously   K is 3.9 from 3.7 Last mag level is 1.7 -Continue Digoxin 0.125mg  - 11mEq PO K-DUR PO BID -Cardiac monitoring -Spironolactone 25 mg daily - IV lasix 80 twice daily -discontinued home metoprolol titrate 25 mg BID, per cardiology      COPD  Denies chest pain or chest tightness. SOB is resolved.  -Symbicort 80-4 0.5 MCG -Levalbuterol 0.63 mg TID -Ellipta 62.5 mcg 1 puff daily   Persistent atrial fibrillation, on eliquis Chronic and rate controlled. HR ranges 89-95 overnight  -Continue Eliquis 5 mg twice daily - Hold metoprolol tartrate 25mg  twice daily -Continue digoxin 0.125mg  -Telemetry -INR is  1.9    IDDM, well controlled Blood glucose max of 153 in last 24 hours.  A1c 6.1 7/15 2020.Patient required no sliding scale insulin overnight.  - holding home AM sliding scale  - hold home metformin  - goal CBG 140-180.  - page MD with CBG > 180 for insulin instruction  - AM BMP    GERD - substitute formulary Protonix 40 mg daily   BPH Reports no urinary symptoms as he has an indwelling catheter currently. 1.7 L output overnight -Continue tamsulosin 0.4 mg daily - will need f/u at urology for cath    Asymptomatic Bacteriuria  Hemoglobinuria Patient with positive nitrites, bacteria, leuks on initial UA. WBC clumps, mucus, hyaline casts, amorphous crystal and triple phosphate crystal present as well. PH 9. > 50 RBC -will not treat as patient is asymptomatic will add on antibiotics if patient develops symptoms    CAD  HLD  Last lipid panel in 2014. No repeat at this time -Continue atorvastatin 80 mg at bedtime   Hypertension BP overnight 91-136/54-78.  MAP 91. - hold benazepril 5 mg daily  - metoprolol once daily at home, holding for now per cardiology   Constipation  hold home Dulcolax 5 mg daily & Polyethylene glycol 17 g twice daily given complaints of diarrhea recently    Chronic anemia Last Hemoglobin 9.4  (9.8 at baseline), MCV 85.3. On home iron supplementation daily  - continue Ferrous sulfate 325 mg    Chronic back pain At home, on Hydrocodone acetaminophen 5-325 mg every 4 PRN; will hold  in setting of dyspnea - please notify MD for pain  - tylenol PRN    Gout - Continue allopurinol 300mg  daily   Pressure Ulcers, sacrum  - daily wound care  - foam dressing to sacrum daily  - q2 hour bed shifting and readjustment    Goals of care - Continue to diurese and consider thoracentesis if necessary - Palliative benefit for outpatient palliative care    FEN/GI: Modified carb diet, Protonix 40 mg PPx: Patient on Eliquis    Disposition: Return to [facility] pending  clearance from cardiology   Subjective:  Patient reports that he is experiencing his normal back and joint pain but reports that his sob is resolved and he has no new pain.   Objective: Temp:  [97.6 F (36.4 C)-98.3 F (36.8 C)] 97.6 F (36.4 C) (08/22 0357) Pulse Rate:  [82-104] 87 (08/22 0357) Resp:  [16-20] 18 (08/22 0357) BP: (91-136)/(54-78) 136/78 (08/22 0357) SpO2:  [98 %-100 %] 100 % (08/22 0357) FiO2 (%):  [32 %] 32 % (08/21 1345) Weight:  [77 kg] 77 kg (08/22 0357)  Physical Exam: General: Elderly male lying in bed in no acute distress watching TV  Cardiovascular: Atrial fibrillation, bilateral radial pulses palpated, no peripheral pitting edema  Respiratory: Patient with normal work of breathing, on 3 L via nasal cannula,breathing comfortably, clear to auscultation bilaterally  Abdomen: Soft, nontender, no masses palpated, +BS throughout Extremities: Bilateral lower extremity edema  Laboratory: Recent Labs  Lab 01/19/19 1408 01/21/19 0446 01/22/19 0601  WBC 8.0 7.6 8.1  HGB 9.8* 9.3* 9.4*  HCT 32.0* 29.4* 30.2*  PLT 421* 386 394   Recent Labs  Lab 01/19/19 1408  01/22/19 0601 01/23/19 0333 01/24/19 0218  NA 137   < > 135 133* 134*  K 4.0   < > 3.7 3.7 3.9  CL 94*   < > 92* 90* 90*  CO2 31   < > 33* 33* 32  BUN 13   < > 14 13 14   CREATININE 0.64   < > 0.74 0.82 0.76  CALCIUM 8.8*   < > 8.9 8.7* 8.8*  PROT 5.8*  --   --   --   --   BILITOT 0.7  --   --   --   --   ALKPHOS 165*  --   --   --   --   ALT 30  --   --   --   --   AST 33  --   --   --   --   GLUCOSE 134*   < > 112* 115* 103*   < > = values in this interval not displayed.    Imaging/Diagnostic Tests: No new imaging  Stark Klein, MD 01/24/2019, 6:47 AM PGY-1, [place] Intern pager: [phone], text pages welcome

## 2019-01-25 LAB — BASIC METABOLIC PANEL
Anion gap: 12 (ref 5–15)
BUN: 12 mg/dL (ref 8–23)
CO2: 29 mmol/L (ref 22–32)
Calcium: 9 mg/dL (ref 8.9–10.3)
Chloride: 92 mmol/L — ABNORMAL LOW (ref 98–111)
Creatinine, Ser: 0.69 mg/dL (ref 0.61–1.24)
GFR calc Af Amer: >60 mL/min (ref >60)
GFR calc non Af Amer: >60 mL/min (ref >60)
Glucose, Bld: 121 mg/dL — ABNORMAL HIGH (ref 70–99)
Potassium: 4.1 mmol/L (ref 3.5–5.1)
Sodium: 133 mmol/L — ABNORMAL LOW (ref 135–145)

## 2019-01-25 MED ORDER — METOPROLOL TARTRATE 12.5 MG HALF TABLET
12.5000 mg | ORAL_TABLET | Freq: Two times a day (BID) | ORAL | Status: DC
  Administered 2019-01-25 – 2019-01-28 (×7): 12.5 mg via ORAL
  Filled 2019-01-25 (×7): qty 1

## 2019-01-25 NOTE — Progress Notes (Signed)
Patient is in bed with no complains, bed alarm on for patient's safety.

## 2019-01-25 NOTE — Progress Notes (Signed)
Family Medicine Teaching Service Daily Progress Note Intern Pager: [phone]  Patient name: [name] Medical record number: [id number] Date of birth: 27-Dec-1931 Age: 85 y.o. Gender: male  Primary Care Provider: Nicoletta Dress, MD Consultants: CCM Code Status: Full    Pt Overview and Major Events to Date:  Admitted 08/17.   Assessment and Plan: ABBIE [name] is a 85 y.o. male presenting with shortness of breath. PMH is significant for CHF, atrial fibrillation, gout, type 2 diabetes, BPH, CAD, history of provoked PE.   SOB 2/2 B/L Pulmonary Effusions   On 3 L nasal cannula saturation at 100%. Continue with diuresis and optimization of respiratory status.  -telemetry -strict BR -Continue strict I's and O's -PT /OT with improved dyspnea  -Continue IV Lasix 80mg  twice daily -continue to monitor creatine with BMP    Acute on Chronic HFpEF Patient reports that his SOB is resolved.  Likely contributed to mitral stenosis.  Stenosis is moderate to severe with regurgitation not a good candidate for mitral valve surgery per cardiology.  2.1L output yesterday.  Weight 76.4 kg from 77 kg previously Cr currently 0.69 from 0.76 previously   K is 4.1 from 3.9 Last mag level is 1.7 -Continue Digoxin 0.125mg  - 3mEq PO K-DUR PO BID -Cardiac monitoring -Spironolactone 25 mg daily - IV lasix 80 twice daily      COPD  Received breathing treatment this morning, denies chest pain or chest tightness. SOB is resolved.  -Symbicort 80-4 0.5 MCG -Levalbuterol 0.63 mg TID -Ellipta 62.5 mcg 1 puff daily   Persistent atrial fibrillation, on eliquis Chronic and rate controlled. HR ranges 95-96 overnight  -Continue Eliquis 5 mg twice daily -Continue digoxin 0.125mg  -Telemetry -INR is 1.9    IDDM, well controlled Blood glucose max of 166 in last 24 hours.  A1c 6.1 7/15 2020. Patient required no sliding scale insulin overnight.  - holding home AM sliding scale  - hold home metformin   - goal CBG 140-180.  - page MD with CBG > 180 for insulin instruction  - AM BMP    GERD - substitute formulary Protonix 40 mg daily   BPH Reports no urinary symptoms as he has an indwelling catheter currently.   1 L output overnight -Continue tamsulosin 0.4 mg daily - will need f/u at urology for cath    Asymptomatic Bacteriuria  Hemoglobinuria Patient with positive nitrites, bacteria, leuks on initial UA. WBC clumps, mucus, hyaline casts, amorphous crystal and triple phosphate crystal present as well. PH 9. > 50 RBC -will not treat as patient is asymptomatic will add on antibiotics if patient develops symptoms    CAD  HLD  Last lipid panel in 2014. No repeat at this time -Continue atorvastatin 80 mg at bedtime   Hypertension BP overnight 111/62. - hold benazepril 5 mg daily  - metoprolol once daily at home, holding for now per cardiology   Constipation  hold home Dulcolax 5 mg daily & Polyethylene glycol 17 g twice daily given complaints of diarrhea recently    Chronic anemia Last Hemoglobin 9.4  (9.8 at baseline), MCV 85.3. On home iron supplementation daily  - continue Ferrous sulfate 325 mg    Chronic back pain At home, on Hydrocodone acetaminophen 5-325 mg every 4 PRN; will hold in setting of dyspnea - please notify MD for pain  - tylenol PRN    Gout - Continue allopurinol 300mg  daily   Pressure Ulcers, sacrum  - daily wound care  - foam dressing  to sacrum daily  - q2 hour bed shifting and readjustment    Goals of care - Continue to diurese  - Palliative benefit for outpatient palliative care    FEN/GI: Modified carb diet, Protonix 40 mg PPx: Patient on Eliquis    Disposition: Return to [facility] pending clearance from cardiology   Subjective:  He states he is continuing to do well just received his breathing treatment this morning, enjoyed his breakfast.  He denies any new pain at this time.  Objective: Temp:  [97.8 F (36.6 C)-97.9 F (36.6 C)] 97.9  F (36.6 C) (08/23 0503) Pulse Rate:  [94-102] 95 (08/23 0503) Resp:  [18-20] 18 (08/23 0503) BP: (107-125)/(59-71) 123/59 (08/23 0503) SpO2:  [99 %-100 %] 100 % (08/23 0503) Weight:  [76.4 kg] 76.4 kg (08/23 0503)  Physical Exam: General: Appears well, no acute distress. Age appropriate. Cardiac: RRR, normal heart sounds, no murmurs Respiratory: CTAB, normal effort Abdomen: soft, nontender, nondistended Extremities: No edema or cyanosis. Skin: Warm and dry, no rashes noted Neuro: alert and oriented, no focal deficits Psych: normal affect  Laboratory: Recent Labs  Lab 01/19/19 1408 01/21/19 0446 01/22/19 0601  WBC 8.0 7.6 8.1  HGB 9.8* 9.3* 9.4*  HCT 32.0* 29.4* 30.2*  PLT 421* 386 394   Recent Labs  Lab 01/19/19 1408  01/23/19 0333 01/24/19 0218 01/25/19 0459  NA 137   < > 133* 134* 133*  K 4.0   < > 3.7 3.9 4.1  CL 94*   < > 90* 90* 92*  CO2 31   < > 33* 32 29  BUN 13   < > 13 14 12   CREATININE 0.64   < > 0.82 0.76 0.69  CALCIUM 8.8*   < > 8.7* 8.8* 9.0  PROT 5.8*  --   --   --   --   BILITOT 0.7  --   --   --   --   ALKPHOS 165*  --   --   --   --   ALT 30  --   --   --   --   AST 33  --   --   --   --   GLUCOSE 134*   < > 115* 103* 121*   < > = values in this interval not displayed.    Imaging/Diagnostic Tests: No new imaging. Please refer to the chart for prior imaging.   Gerlene Fee, DO 01/25/2019, 10:05 AM PGY-1, [place] Intern pager: [phone], text pages welcome

## 2019-01-25 NOTE — Progress Notes (Signed)
Progress Note  Patient Name: [name] Date of Encounter: 01/25/2019  Primary Cardiologist: Kirk Ruths, MD  Subjective   Generally feels better.  No chest pain or palpitations.  Did not feel lightheaded when he got up for a weight.  Inpatient Medications    Scheduled Meds: . allopurinol  300 mg Oral Daily  . apixaban  5 mg Oral BID  . atorvastatin  80 mg Oral QHS  . brimonidine  1 drop Both Eyes BID  . digoxin  0.125 mg Oral Daily  . ferrous sulfate  325 mg Oral Q breakfast  . furosemide  80 mg Intravenous BID  . levalbuterol  0.63 mg Nebulization TID  . Melatonin  6 mg Oral QHS  . multivitamin with minerals  1 tablet Oral Daily  . pantoprazole  40 mg Oral Daily  . potassium chloride  30 mEq Oral BID  . spironolactone  25 mg Oral Daily  . tamsulosin  0.4 mg Oral QPM  . umeclidinium bromide  1 puff Inhalation Daily    PRN Meds: acetaminophen **OR** acetaminophen, HYDROcodone-acetaminophen   Vital Signs    Vitals:   01/24/19 1259 01/24/19 1948 01/24/19 1958 01/25/19 0503  BP: 125/71  111/62 (!) 123/59  Pulse: 100 94 96 95  Resp: 18 20 18 18   Temp: 97.8 F (36.6 C)  97.9 F (36.6 C) 97.9 F (36.6 C)  TempSrc: Oral  Oral Oral  SpO2: 100% 100% 100% 100%  Weight:    76.4 kg  Height:        Intake/Output Summary (Last 24 hours) at 01/25/2019 0830 Last data filed at 01/25/2019 0236 Gross per 24 hour  Intake 720 ml  Output 1950 ml  Net -1230 ml   Filed Weights   01/23/19 0350 01/24/19 0357 01/25/19 0503  Weight: 76.9 kg 77 kg 76.4 kg    Telemetry    Atrial fibrillation.  Personally reviewed.  ECG    An ECG dated 01/22/2019 was personally reviewed today and demonstrated:  Atrial fibrillation with left bundle branch block.  Physical Exam   GEN:  Elderly male.  No acute distress.   Neck: No JVD. Cardiac:  Irregularly irregular, soft systolic murmur, rub, or gallop.  Respiratory: Nonlabored.  Decreased breath sounds without wheezing. GI: Soft,  nontender, bowel sounds present. MS: No edema; No deformity. Neuro:  Nonfocal. Psych: Alert and oriented x 3. Normal affect.  Labs    Chemistry Recent Labs  Lab 01/19/19 1408  01/23/19 0333 01/24/19 0218 01/25/19 0459  NA 137   < > 133* 134* 133*  K 4.0   < > 3.7 3.9 4.1  CL 94*   < > 90* 90* 92*  CO2 31   < > 33* 32 29  GLUCOSE 134*   < > 115* 103* 121*  BUN 13   < > 13 14 12   CREATININE 0.64   < > 0.82 0.76 0.69  CALCIUM 8.8*   < > 8.7* 8.8* 9.0  PROT 5.8*  --   --   --   --   ALBUMIN 2.6*  --   --   --   --   AST 33  --   --   --   --   ALT 30  --   --   --   --   ALKPHOS 165*  --   --   --   --   BILITOT 0.7  --   --   --   --  GFRNONAA >60   < > >60 >60 >60  GFRAA >60   < > >60 >60 >60  ANIONGAP 12   < > 10 12 12    < > = values in this interval not displayed.     Hematology Recent Labs  Lab 01/19/19 1408 01/21/19 0446 01/22/19 0601  WBC 8.0 7.6 8.1  RBC 3.64* 3.43* 3.54*  HGB 9.8* 9.3* 9.4*  HCT 32.0* 29.4* 30.2*  MCV 87.9 85.7 85.3  MCH 26.9 27.1 26.6  MCHC 30.6 31.6 31.1  RDW 15.9* 15.7* 15.6*  PLT 421* 386 394    Cardiac Enzymes Recent Labs  Lab 12/31/18 1635 01/01/19 0128 01/01/19 0344 01/01/19 0605 01/20/19 0023  TROPONINIHS 34* 27* 26* 22* 26*    BNP Recent Labs  Lab 01/19/19 1408  BNP 222.4*     Radiology    No results found.  Cardiac Studies   Echocardiogram 01/01/2019: 1. The left ventricle has low normal systolic function, with an ejection fraction of 50-55%. The cavity size was normal. There is mildly increased left ventricular wall thickness. Left ventricular diastolic Doppler parameters are indeterminate. There is abnormal septal motion consistent with post-operative status. 2. The right ventricle has mildly reduced systolic function. The cavity was normal. There is no increase in right ventricular wall thickness. Right ventricular systolic pressure is moderately elevated with an estimated pressure of 55.2 mmHg. 3.  Left atrial size was severely dilated. 4. A 26 Edwards Sapien bioprosthetic aortic valve (TAVR) valve is present in the aortic position. Procedure Date: 09/17/17 Echo findings shows no evidence of rocking, dehiscence. Mean systolic gradient 11 mmHg, calculated valve area approximately 2 cm2  using LVOT diameter of 2.6 cm and LVOT TVI 14 cm. Likely trivial prosthetic aortic valve regurgitation, though challenging to assess whether this is prosthetic or paravalvular due to image quality. 5. The mitral valve is abnormal. There is moderate mitral annular calcification present. Mitral valve regurgitation is mild to moderate by color flow Doppler. Moderate-severe mitral valve stenosis. Mean diastolic gradient 13 mmHg at HR 100-105. 6. Tricuspid valve regurgitation is moderate. 7. When compared to the prior study: 11/24/2018 - no significant change in LV function. No change in degree of mitral valve stenosis. Stable function of aortic prosthesis. Side by side comparison of images performed.  Patient Profile     85 y.o. male with past medical history of coronary artery disease status post coronary artery bypass graft, prior TAVR for aortic stenosis, permanent atrial fibrillation, mitral stenosis, and COPD presenting with acute on chronic diastolic congestive heart failure.  Assessment & Plan    1.  Acute on chronic diastolic heart failure with LVEF 50 to 55%.  He continues on IV Lasix with good diuresis, 1200 cc out more than in last 24 hours and weight down another pound.  2.  Moderate to severe mitral stenosis with mild to moderate mitral regurgitation.  Patient is not a surgical candidate.  3.  Aortic stenosis status post TAVR with stable prosthetic function by echocardiogram in July.  4.  Permanent atrial fibrillation managed with strategy of heart rate control and anticoagulation.  He continues on Eliquis.  He is on Lanoxin at this time, we discussed resuming Lopressor at lower dose than previously  to achieve better heart rate control.  5.  CAD status post CABG.  No active angina symptoms.  Continue with IV diuresis.  Resume Lopressor although at 12.5 mg twice daily along with Lanoxin to see if this achieves better heart rate control particularly  as he becomes more active.  Follow-up BMET in a.m.  Signed, Rozann Lesches, MD  01/25/2019, 8:30 AM

## 2019-01-26 LAB — BASIC METABOLIC PANEL
Anion gap: 9 (ref 5–15)
BUN: 15 mg/dL (ref 8–23)
CO2: 31 mmol/L (ref 22–32)
Calcium: 9.1 mg/dL (ref 8.9–10.3)
Chloride: 93 mmol/L — ABNORMAL LOW (ref 98–111)
Creatinine, Ser: 0.78 mg/dL (ref 0.61–1.24)
GFR calc Af Amer: >60 mL/min (ref >60)
GFR calc non Af Amer: >60 mL/min (ref >60)
Glucose, Bld: 125 mg/dL — ABNORMAL HIGH (ref 70–99)
Potassium: 4.3 mmol/L (ref 3.5–5.1)
Sodium: 133 mmol/L — ABNORMAL LOW (ref 135–145)

## 2019-01-26 LAB — CBC
HCT: 32.2 % — ABNORMAL LOW (ref 39.0–52.0)
Hemoglobin: 10 g/dL — ABNORMAL LOW (ref 13.0–17.0)
MCH: 26.5 pg (ref 26.0–34.0)
MCHC: 31.1 g/dL (ref 30.0–36.0)
MCV: 85.2 fL (ref 80.0–100.0)
Platelets: 371 10*3/uL (ref 150–400)
RBC: 3.78 MIL/uL — ABNORMAL LOW (ref 4.22–5.81)
RDW: 15.9 % — ABNORMAL HIGH (ref 11.5–15.5)
WBC: 8.1 10*3/uL (ref 4.0–10.5)
nRBC: 0 % (ref 0.0–0.2)

## 2019-01-26 NOTE — Progress Notes (Signed)
Physical Therapy Treatment Patient Details Name: [name] MRN: [id number] DOB: September 29, 1931 Today's Date: 01/26/2019    History of Present Illness  85 y.o. male presenting 01/19/19 with shortness of breath and bil pulmonary effusions. Admitted 12/31/18 and required biPAP and bil thoracentesis. He has been admitted multiple times s/p LUE surgery June 2020 for infection. PMH is significant for CHF, atrial fibrillation, gout, type 2 diabetes, BPH, CAD, PE.    PT Comments    Pt supine with HOB elevated on arrival stating he stood on scale this morning without report of dizziness. Pt reporting he doesn't like getting to chair because they "forgot about me at the center". Pt with report of dizziness EOB with BP initially 103/66 (75) with maintained BP EOB after HEP 110/63 (75). Pt with SpO2 98% on 2L with HR 107. No symptoms of vestibular dysfunction and pt with increased RR to 35 with sitting EOB with pt reporting nervousness about falling with attempts at standing. Pt continues to report he was walking and performing stairs at SNF but unwilling to progress mobility this session with all VSS. Pt reassured, given cues for breathing technique and calming but unable to advance to OOB. PT may benefit from 2 person assist for reassurance to get OOB.     Follow Up Recommendations  SNF;Supervision/Assistance - 24 hour     Equipment Recommendations  None recommended by PT    Recommendations for Other Services       Precautions / Restrictions Precautions Precautions: Fall Precaution Comments: watch BP     Mobility  Bed Mobility Overal bed mobility: Needs Assistance Bed Mobility: Supine to Sit;Sit to Supine     Supine to sit: Supervision Sit to supine: Supervision   General bed mobility comments: pt able to transition to EOB and back without physical assist. Pt reports dizziness EOB with BP 103/66 (75) and with increased time sitting and performing bil LE HEP BP maintained 110/63 (75) with  pt anxious and hyperventilating refusing further mobility and returning to supine. Pt initialy requesting assist to slide toward [name] but with cues and bil UE use of rail pt able to slide to [facility] with only supervision  Transfers                 General transfer comment: pt refused attempting stating dizziness with all VSS and pt with apparent anxiety limiting mobility  Ambulation/Gait             General Gait Details: pt declined   Stairs             Wheelchair Mobility    Modified Rankin (Stroke Patients Only)       Balance   Sitting-balance support: Feet supported;No upper extremity supported Sitting balance-Leahy Scale: Good                                      Cognition Arousal/Alertness: Awake/alert Behavior During Therapy: Anxious Overall Cognitive Status: Impaired/Different from baseline Area of Impairment: Memory                     Memory: Decreased short-term memory         General Comments: on arrival pt stating he wasn't finished with breakfast but didn't want it and in next breath stating he was finished. Pt with anxiety and hyperventilation once EOB with cues for calming and breathing technique  Exercises General Exercises - Lower Extremity Long Arc Quad: AROM;Both;Seated;10 reps Hip Flexion/Marching: AROM;Both;5 reps;Seated    General Comments        Pertinent Vitals/Pain Pain Assessment: No/denies pain    Home Living                      Prior Function            PT Goals (current goals can now be found in the care plan section) Progress towards PT goals: Not progressing toward goals - comment    Frequency    Min 2X/week      PT Plan Current plan remains appropriate    Co-evaluation              AM-PAC PT "6 Clicks" Mobility   Outcome Measure  Help needed turning from your back to your side while in a flat bed without using bedrails?: A Little Help needed moving  from lying on your back to sitting on the side of a flat bed without using bedrails?: A Little Help needed moving to and from a bed to a chair (including a wheelchair)?: A Lot Help needed standing up from a chair using your arms (e.g., wheelchair or bedside chair)?: A Lot Help needed to walk in hospital room?: Total Help needed climbing 3-5 steps with a railing? : Total 6 Click Score: 12    End of Session Equipment Utilized During Treatment: Oxygen Activity Tolerance: Other (comment)(limited by "dizziness" with apparent anxiety) Patient left: in bed;with call bell/phone within reach;with bed alarm set Nurse Communication: Mobility status PT Visit Diagnosis: Unsteadiness on feet (R26.81);Muscle weakness (generalized) (M62.81);Difficulty in walking, not elsewhere classified (R26.2)     Time: 6286-3817 PT Time Calculation (min) (ACUTE ONLY): 21 min  Charges:  $Therapeutic Activity: 8-22 mins                     Ziquan Fidel Pam Drown, PT Acute Rehabilitation Services Pager: [phone] Office: [place] 01/26/2019, 1:48 PM

## 2019-01-26 NOTE — Progress Notes (Signed)
Family Medicine Teaching Service Daily Progress Note Intern Pager: [phone]  Patient name: [name] Medical record number: [id number] Date of birth: 1931-11-11 Age: 85 y.o. Gender: male  Primary Care Provider: Nicoletta Dress, MD Consultants: CCM Code Status: Full    Pt Overview and Major Events to Date:  Admitted 08/17.   Assessment and Plan: [name] is a 85 y.o. male presenting with shortness of breath. PMH is significant for CHF, atrial fibrillation, gout, type 2 diabetes, BPH, CAD, history of provoked PE.   SOB 2/2 B/L Pulmonary Effusions   On 3 L nasal cannula and room air saturation at 100%. Continue with diuresis and optimization of respiratory status.  -telemetry -strict BR -Continue strict I's and O's -PT /OT with improved dyspnea  -Continue IV Lasix 80mg  twice daily -continue to monitor creatine with BMP    Acute on Chronic HFpEF Patient reports that his SOB is resolved.  Likely contributed to mitral stenosis.  Stenosis is moderate to severe with regurgitation not a good candidate for mitral valve surgery per cardiology. 1L output yesterday.  Weight 75.6 kg from 76.4 kg previously Cr currently 0.78 from 0.6 previously   K is 4.3 from 4.1 Last mag level is 1.7 -Continue Digoxin 0.125mg , metoprolol 12.5mg  added back on  -Cardiac monitoring -Spironolactone 25 mg daily - IV lasix 80 twice daily      COPD  Received breathing treatment this morning, denies chest pain or chest tightness. SOB is resolved.  -Symbicort 80-4 0.5 MCG -Levalbuterol 0.63 mg TID -Ellipta 62.5 mcg 1 puff daily   Persistent atrial fibrillation, on eliquis Chronic and rate controlled. HR 87 overnight  -Continue Eliquis 5 mg twice daily -Continue digoxin 0.125mg  -Telemetry -INR is 1.9    IDDM, well controlled Blood glucose max of 125 in last 24 hours.  A1c 6.1 7/15 2020. Patient required no sliding scale insulin overnight.  - holding home AM sliding scale  - hold home  metformin  - goal CBG 140-180.  - page MD with CBG > 180 for insulin instruction  - AM BMP    GERD - substitute formulary Protonix 40 mg daily   BPH Reports no urinary symptoms as he has an indwelling catheter currently.   1 L output overnight -Continue tamsulosin 0.4 mg daily - will need f/u at urology for cath    Asymptomatic Bacteriuria  Hemoglobinuria Patient with positive nitrites, bacteria, leuks on initial UA. WBC clumps, mucus, hyaline casts, amorphous crystal and triple phosphate crystal present as well. PH 9. > 50 RBC -will not treat as patient is asymptomatic will add on antibiotics if patient develops symptoms    CAD  HLD  Last lipid panel in 2014. No repeat at this time -Continue atorvastatin 80 mg at bedtime   Hypertension BP overnight 124/58. - hold benazepril 5 mg daily  - metoprolol once daily at home   Constipation  hold home Dulcolax 5 mg daily & Polyethylene glycol 17 g twice daily given complaints of diarrhea recently    Chronic anemia 8/24 Hemoglobin 10  (9.8 at baseline), MCV 85.2. On home iron supplementation daily  - continue Ferrous sulfate 325 mg -CBC am   Chronic back pain At home, on Hydrocodone acetaminophen 5-325 mg every 4 PRN; will hold in setting of dyspnea - please notify MD for pain  - tylenol PRN    Gout - Continue allopurinol 300mg  daily   Pressure Ulcers, sacrum  - daily wound care  - foam dressing to sacrum daily  -  q2 hour bed shifting and readjustment    Goals of care - Continue to diurese per cardiology - Palliative benefit for outpatient palliative care    FEN/GI: Modified carb diet, Protonix 40 mg PPx: Patient on Eliquis    Disposition: Return to [place] pending clearance from cardiology   Subjective:  He is doing well resting in bed just received a breathing treatment and feels better after that.  He complains of lightheaded and dizziness when sitting on the side of the bed.  The symptoms are resolved by laying  back in bed.  Objective: Temp:  [97.6 F (36.4 C)-98.5 F (36.9 C)] 98.5 F (36.9 C) (08/24 0514) Pulse Rate:  [87-96] 87 (08/24 0514) Resp:  [18-20] 20 (08/24 0514) BP: (108-124)/(58-66) 124/58 (08/24 0514) SpO2:  [99 %-100 %] 100 % (08/24 0514) Weight:  [75.6 kg] 75.6 kg (08/24 0531)  Physical Exam: General: Appears well, no acute distress. Age appropriate. Cardiac: RRR, normal heart sounds, no murmurs Respiratory: Bilateral basilar crackles heard, increased respiratory effort Abdomen: soft, nontender, nondistended Extremities: No edema or cyanosis. Skin: Warm and dry, no rashes noted Neuro: alert and oriented, no focal deficits Psych: normal affect   Laboratory: Recent Labs  Lab 01/19/19 1408 01/21/19 0446 01/22/19 0601  WBC 8.0 7.6 8.1  HGB 9.8* 9.3* 9.4*  HCT 32.0* 29.4* 30.2*  PLT 421* 386 394   Recent Labs  Lab 01/19/19 1408  01/24/19 0218 01/25/19 0459 01/26/19 0526  NA 137   < > 134* 133* 133*  K 4.0   < > 3.9 4.1 4.3  CL 94*   < > 90* 92* 93*  CO2 31   < > 32 29 31  BUN 13   < > 14 12 15   CREATININE 0.64   < > 0.76 0.69 0.78  CALCIUM 8.8*   < > 8.8* 9.0 9.1  PROT 5.8*  --   --   --   --   BILITOT 0.7  --   --   --   --   ALKPHOS 165*  --   --   --   --   ALT 30  --   --   --   --   AST 33  --   --   --   --   GLUCOSE 134*   < > 103* 121* 125*   < > = values in this interval not displayed.    Imaging/Diagnostic Tests: No new imaging. Please refer to the chart for prior imaging.   Gerlene Fee, DO 01/26/2019, 2:24 PM PGY-1, [place] Intern pager: [phone], text pages welcome

## 2019-01-26 NOTE — Progress Notes (Addendum)
Progress Note  Patient Name: [name] Date of Encounter: 01/26/2019  Primary Cardiologist: Kirk Ruths, MD   Subjective   Patient is resting comfortably at this time.  He had just worked with physical therapy and says that he was quite short of breath.  He slept better last night with no significant orthopnea.  He currently has no edema.  The patient feels that he is improved but not yet at his baseline.  He got a little nervous when he sat on the edge of the bed, hyperventilated a bit.  Inpatient Medications    Scheduled Meds: . allopurinol  300 mg Oral Daily  . apixaban  5 mg Oral BID  . atorvastatin  80 mg Oral QHS  . brimonidine  1 drop Both Eyes BID  . digoxin  0.125 mg Oral Daily  . ferrous sulfate  325 mg Oral Q breakfast  . furosemide  80 mg Intravenous BID  . levalbuterol  0.63 mg Nebulization TID  . Melatonin  6 mg Oral QHS  . metoprolol tartrate  12.5 mg Oral BID  . multivitamin with minerals  1 tablet Oral Daily  . pantoprazole  40 mg Oral Daily  . potassium chloride  30 mEq Oral BID  . spironolactone  25 mg Oral Daily  . tamsulosin  0.4 mg Oral QPM  . umeclidinium bromide  1 puff Inhalation Daily   Continuous Infusions:  PRN Meds: acetaminophen **OR** acetaminophen, HYDROcodone-acetaminophen   Vital Signs    Vitals:   01/26/19 0514 01/26/19 0531 01/26/19 0816 01/26/19 0817  BP: (!) 124/58  120/60   Pulse: 87  85   Resp: 20  18   Temp: 98.5 F (36.9 C)     TempSrc: Oral     SpO2: 100%  100% 99%  Weight:  75.6 kg    Height:        Intake/Output Summary (Last 24 hours) at 01/26/2019 0846 Last data filed at 01/26/2019 0202 Gross per 24 hour  Intake 1400 ml  Output 2275 ml  Net -875 ml   Last 3 Weights 01/26/2019 01/25/2019 01/24/2019  Weight (lbs) 166 lb 11.2 oz 168 lb 8 oz 169 lb 12.8 oz  Weight (kg) 75.615 kg 76.431 kg 77.021 kg      Telemetry    Atrial fibrillation with rates 90s-110's with occasional PVCs- Personally Reviewed   ECG    No new tracings- Personally Reviewed  Physical Exam   GEN: No acute distress.   Neck: No JVD Cardiac:  Mildly irregular rhythm, no murmurs, rubs, or gallops.  Respiratory: Clear to auscultation bilaterally, diminished in the bases. GI: Soft, nontender, non-distended  MS: No edema; No deformity. Neuro:  Nonfocal  Psych: Normal affect   Labs    High Sensitivity Troponin:   Recent Labs  Lab 12/31/18 1635 01/01/19 0128 01/01/19 0344 01/01/19 0605 01/20/19 0023  TROPONINIHS 34* 27* 26* 22* 26*      Cardiac EnzymesNo results for input(s): TROPONINI in the last 168 hours. No results for input(s): TROPIPOC in the last 168 hours.   Chemistry Recent Labs  Lab 01/19/19 1408  01/24/19 0218 01/25/19 0459 01/26/19 0526  NA 137   < > 134* 133* 133*  K 4.0   < > 3.9 4.1 4.3  CL 94*   < > 90* 92* 93*  CO2 31   < > 32 29 31  GLUCOSE 134*   < > 103* 121* 125*  BUN 13   < > 14 12  15  CREATININE 0.64   < > 0.76 0.69 0.78  CALCIUM 8.8*   < > 8.8* 9.0 9.1  PROT 5.8*  --   --   --   --   ALBUMIN 2.6*  --   --   --   --   AST 33  --   --   --   --   ALT 30  --   --   --   --   ALKPHOS 165*  --   --   --   --   BILITOT 0.7  --   --   --   --   GFRNONAA >60   < > >60 >60 >60  GFRAA >60   < > >60 >60 >60  ANIONGAP 12   < > 12 12 9    < > = values in this interval not displayed.     Hematology Recent Labs  Lab 01/19/19 1408 01/21/19 0446 01/22/19 0601  WBC 8.0 7.6 8.1  RBC 3.64* 3.43* 3.54*  HGB 9.8* 9.3* 9.4*  HCT 32.0* 29.4* 30.2*  MCV 87.9 85.7 85.3  MCH 26.9 27.1 26.6  MCHC 30.6 31.6 31.1  RDW 15.9* 15.7* 15.6*  PLT 421* 386 394    BNP Recent Labs  Lab 01/19/19 1408  BNP 222.4*     DDimer No results for input(s): DDIMER in the last 168 hours.   Radiology    No results found.  Cardiac Studies   Echocardiogram 01/01/2019: 1. The left ventricle has low normal systolic function, with an ejection fraction of 50-55%. The cavity size was normal. There  is mildly increased left ventricular wall thickness. Left ventricular diastolic Doppler parameters are indeterminate. There is abnormal septal motion consistent with post-operative status. 2. The right ventricle has mildly reduced systolic function. The cavity was normal. There is no increase in right ventricular wall thickness. Right ventricular systolic pressure is moderately elevated with an estimated pressure of 55.2 mmHg. 3. Left atrial size was severely dilated. 4. A 26 Edwards Sapien bioprosthetic aortic valve (TAVR) valve is present in the aortic position. Procedure Date: 09/17/17 Echo findings shows no evidence of rocking, dehiscence. Mean systolic gradient 11 mmHg, calculated valve area approximately 2 cm2  using LVOT diameter of 2.6 cm and LVOT TVI 14 cm. Likely trivial prosthetic aortic valve regurgitation, though challenging to assess whether this is prosthetic or paravalvular due to image quality. 5. The mitral valve is abnormal. There is moderate mitral annular calcification present. Mitral valve regurgitation is mild to moderate by color flow Doppler. Moderate-severe mitral valve stenosis. Mean diastolic gradient 13 mmHg at HR 100-105. 6. Tricuspid valve regurgitation is moderate. 7. When compared to the prior study: 11/24/2018 - no significant change in LV function. No change in degree of mitral valve stenosis. Stable function of aortic prosthesis. Side by side comparison of images performed.   Patient Profile     85 y.o. male with past medical history of coronary artery disease status post coronary artery bypass graft, prior TAVR for aortic stenosis, permanent atrial fibrillation, mitral stenosis,andCOPDpresenting withacute on chronic diastolic congestive heart failure.  Assessment & Plan    Acute on chronic diastolic heart failure -LVEF 50-55% by echo in 12/2018 -Continues on Lasix 80 mg IV twice daily, Spironolactone 25 mg.  Low-dose beta-blocker added yesterday.   Patient continues on Lanoxin. -2.2 L urine output yesterday with a -9 L fluid balance since admission. -Weight is down 2 pounds from yesterday overall 13 pounds down since admission. -  Renal function is stable with diuresis.  Serum creatinine 0.78 today. -Patient is likely nearing baseline.  Will defer any changes to attending, Dr. Marlou Porch.  Mitral stenosis -Moderate to severe MS and mild-mod MR.  -Pt is not a surgical candidate.   S/P TAVR  -Stable prosthetic function by echo in July.  Permanent atrial fibrillation  -Rate control on digoxin and lopressor 12.5 mg added yesterday for better rate control.  Heart rates in the 90s-110s.  Blood pressure is stable.  Continue to monitor with addition of beta-blocker and titrate up as blood pressure allows. -On Eliquis for stroke risk reduction  CAD s/p CABG -On high intensity statin.  Not on aspirin due to need for anticoagulation -No anginal symptoms.     For questions or updates, please contact [place] Please consult [email or web address] for contact info under        Signed, Daune Perch, NP  01/26/2019, 8:46 AM    Personally seen and examined. Agree with above.   Overall feeling a little bit better but he was quite nervous when sitting on the edge of the bed and physical therapy tried to stand him up and walk him.  She noted some hyperventilation.  Mild dizziness.  He was able to weigh himself this morning with assistance.  Good overall diuresis.  GEN: Well nourished, well developed, in no acute distress  HEENT: normal  Neck: no JVD, carotid bruits, or masses Cardiac: Mildly irregular; no murmurs, rubs, or gallops,no edema  Respiratory:  clear to auscultation bilaterally, normal work of breathing GI: soft, nontender, nondistended, + BS MS: no deformity or atrophy  Skin: warm and dry, no rash Neuro:  Alert and Oriented x 3, Strength and sensation are intact Psych: euthymic mood, full affect  Acute on chronic diastolic heart  failure Mitral stenosis TAVR- (aortic valve) Permanent atrial fibrillation CABG/CAD  - Continuing with Lasix 80 mg IV twice a day, Aldactone 25 mg a day.  Renal function stable.  13 pounds down.  Getting close to baseline, perhaps tomorrow switch to p.o.  Creatinine 0.78. -Continuing digoxin and low-dose beta-blocker for rate control.  Heart rates currently in the 90s.  Candee Furbish, MD

## 2019-01-26 NOTE — Care Management Important Message (Signed)
Important Message  Patient Details  Name: RIK WADEL MRN: [id number] Date of Birth: 01-01-1932   Medicare Important Message Given:  Yes     [name] 01/26/2019, 12:53 PM

## 2019-01-27 LAB — BASIC METABOLIC PANEL
Anion gap: 9 (ref 5–15)
BUN: 17 mg/dL (ref 8–23)
CO2: 30 mmol/L (ref 22–32)
Calcium: 9.3 mg/dL (ref 8.9–10.3)
Chloride: 94 mmol/L — ABNORMAL LOW (ref 98–111)
Creatinine, Ser: 0.87 mg/dL (ref 0.61–1.24)
GFR calc Af Amer: >60 mL/min (ref >60)
GFR calc non Af Amer: >60 mL/min (ref >60)
Glucose, Bld: 123 mg/dL — ABNORMAL HIGH (ref 70–99)
Potassium: 4.5 mmol/L (ref 3.5–5.1)
Sodium: 133 mmol/L — ABNORMAL LOW (ref 135–145)

## 2019-01-27 LAB — SARS CORONAVIRUS 2 (TAT 6-24 HRS): SARS Coronavirus 2: NEGATIVE

## 2019-01-27 MED ORDER — FUROSEMIDE 80 MG PO TABS
80.0000 mg | ORAL_TABLET | Freq: Two times a day (BID) | ORAL | Status: DC
  Administered 2019-01-27 – 2019-01-28 (×2): 80 mg via ORAL
  Filled 2019-01-27 (×2): qty 1

## 2019-01-27 NOTE — Discharge Summary (Addendum)
[facility] Discharge Summary  Patient name: [name] Medical record number: [id number] Date of birth: 1931-10-05 Age: 85 y.o. Gender: male Date of Admission: 01/19/2019  Date of Discharge: 01/28/2019 Admitting Physician: Blane Ohara McDiarmid, MD  Primary Care Provider: Nicoletta Dress, MD Consultants: Cardiology, CCM, Palliative  Indication for Hospitalization:  Acute respiratory failure  Discharge Diagnoses/Problem List:  Chief Problem: SOB2/2 B/L Pulmonary Effusions   Acute on Chronic HFpEF COPD  Persistent atrial fibrillation, on eliquis IDDM, well controlled GERD BPH Asymptomatic Bacteriuria  Hemoglobinuria CAD HLD  Hypertension Constipation Chronic anemia Chronic back pain Gout Pressure Ulcers, sacrum  Goals of care  Disposition: SNF  Discharge Condition: Stable  Discharge Exam:   General: Appears well, no acute distress. Age appropriate. Cardiac: RRR, normal heart sounds, no murmurs Respiratory: Bibasilar crackles, mild increased effort Abdomen: soft, nontender, nondistended Extremities: No edema or cyanosis. Skin: Warm and dry, no rashes noted Neuro: alert and oriented, no focal deficits Psych: normal affect   Brief Hospital Course:  Admitted 01/19/2019.  Patient was complaining of dyspnea and tach tachypnea that started a couple days ago. to worsen even after breathing treatments. Patient recently admitted multiple times with dyspnea. Most reently, on 12/31/18 where patient required bipap and ultimately b/l thoracentesis for relief of dyspnea. 1.2 L was drained during that admission from b/l lung fields; however, no labs were obtained on drained pleural fluid. He reported feeling much improved after those procedures. He was scheduled for o/p thoracentesis with Cataract on 01/23/19. On CXR, pulmonary vascular congestion present w/  Lower lung atelectasis and bilateral pulmonary effusions.  Most likely a CHF exacerbation.  Cardiology was consulted diuresis was initiated with IV Lasix.  Performed daily monitoring of respiratory status and kidney function.  He continues to be on 2 L of nasal cannula and continues to receive breathing treatments and has stated he is feeling much better. Cardiology continue to diurese him until they felt he was stable.  He received multiple chest x-rays that continue to show mild pleural effusions.  Today cardiology has recommended Lasix 80 mg p.o. twice daily to follow-up outpatient.  Issues for Follow Up:  1.  Needs outpatient follow-up with palliative care. 2.  Needs outpatient follow-up with cardiology, 80mg  PO Lasix twice daily outpatient monitoring of BMP. Try to maintain current weight of 166lbs outpatient.  Significant Procedures: None   Significant Labs and Imaging:  Recent Labs  Lab 01/21/19 0446 01/22/19 0601 01/26/19 1055  WBC 7.6 8.1 8.1  HGB 9.3* 9.4* 10.0*  HCT 29.4* 30.2* 32.2*  PLT 386 394 371   Recent Labs  Lab 01/23/19 0333 01/24/19 0218 01/25/19 0459 01/26/19 0526 01/27/19 0420  NA 133* 134* 133* 133* 133*  K 3.7 3.9 4.1 4.3 4.5  CL 90* 90* 92* 93* 94*  CO2 33* 32 29 31 30   GLUCOSE 115* 103* 121* 125* 123*  BUN 13 14 12 15 17   CREATININE 0.82 0.76 0.69 0.78 0.87  CALCIUM 8.7* 8.8* 9.0 9.1 9.3  MG 1.7  --   --   --   --    PORTABLE CHEST 1 VIEW Result date 01/22/2019  COMPARISON:  01/21/2019 IMPRESSION: Cardiomegaly, vascular congestion. Small bilateral effusions with bibasilar atelectasis or infiltrates. No change.    Results/Tests Pending at Time of Discharge: None  Discharge Medications:  Allergies as of 01/28/2019      Reactions   Bactrim [sulfamethoxazole-trimethoprim] Other (See Comments)   "Allergic," per [facility]   Levaquin [levofloxacin In D5w]  Diarrhea, Other (See Comments)   "Allergic," per [facility]      Medication List    STOP taking these medications   benazepril 5 MG tablet Commonly known as: LOTENSIN   bisacodyl 5 MG EC  tablet Commonly known as: DULCOLAX     TAKE these medications   allopurinol 300 MG tablet Commonly known as: ZYLOPRIM Take 300 mg by mouth daily.   apixaban 5 MG Tabs tablet Commonly known as: ELIQUIS Take 1 tablet (5 mg total) by mouth 2 (two) times daily.   atorvastatin 80 MG tablet Commonly known as: LIPITOR Take 1 tablet (80 mg total) by mouth at bedtime.   brimonidine 0.2 % ophthalmic solution Commonly known as: ALPHAGAN Place 1 drop into both eyes 2 (two) times daily.   digoxin 0.125 MG tablet Commonly known as: LANOXIN Take 1 tablet (0.125 mg total) by mouth daily. Start taking on: January 29, 2019   ferrous sulfate 325 (65 FE) MG tablet Take 325 mg by mouth daily with breakfast.   Foam Dressing Pads Apply 1 patch topically See admin instructions. Apply a foam dressing to sacrum every day AND as needed for redness   furosemide 80 MG tablet Commonly known as: LASIX Take 1 tablet (80 mg total) by mouth daily. What changed: when to take this   HYDROcodone-acetaminophen 5-325 MG tablet Commonly known as: NORCO/VICODIN Take 1-2 tablets by mouth every 4 (four) hours as needed for moderate pain or severe pain.   lansoprazole 30 MG capsule Commonly known as: PREVACID Take 30 mg by mouth daily before breakfast.   levalbuterol 0.63 MG/3ML nebulizer solution Commonly known as: XOPENEX Take 3 mLs (0.63 mg total) by nebulization every 6 (six) hours as needed for wheezing or shortness of breath. What changed: when to take this   Melatonin 5 MG Tabs Take 5 mg by mouth at bedtime.   metFORMIN 500 MG 24 hr tablet Commonly known as: GLUCOPHAGE-XR Take 500 mg by mouth daily.   metoprolol tartrate 25 MG tablet Commonly known as: LOPRESSOR Take 0.5 tablets (12.5 mg total) by mouth 2 (two) times daily. What changed: how much to take   multivitamin with minerals Tabs tablet Take 1 tablet by mouth daily.   NON FORMULARY Take 120 mLs by mouth See admin instructions.  MedPass: Drink 120 ml's by mouth two times a day   NovoLOG FlexPen 100 UNIT/ML FlexPen Generic drug: insulin aspart Inject 2 Units into the skin See admin instructions. Inject 2 units into the skin at 9 AM in the morning for a BGL >200   OXYGEN Inhale 3 L/min into the lungs continuous. TO MAINTAIN SATS <90%   polyethylene glycol 17 g packet Commonly known as: MIRALAX / GLYCOLAX Take 17 g by mouth 2 (two) times daily. What changed:   when to take this  additional instructions   potassium chloride SA 15 MEQ tablet Commonly known as: KLOR-CON M15 Take 2 tablets (30 mEq total) by mouth 2 (two) times daily.   Pro-Stat Liqd Take 30 mLs by mouth 2 (two) times a day.   spironolactone 25 MG tablet Commonly known as: ALDACTONE Take 25 mg by mouth daily.   Symbicort 80-4.5 MCG/ACT inhaler Generic drug: budesonide-formoterol Inhale 1 puff into the lungs daily as needed ("for shortness of breath or wheezing- rinse mouth afterwards").   tamsulosin 0.4 MG Caps capsule Commonly known as: FLOMAX Take 1 capsule (0.4 mg total) by mouth daily after supper. What changed: when to take this   trolamine salicylate 10 %  cream Commonly known as: ASPERCREME Apply 1 application topically every 6 (six) hours as needed for muscle pain.   umeclidinium bromide 62.5 MCG/INH Aepb Commonly known as: INCRUSE ELLIPTA Inhale 1 puff into the lungs daily.       Discharge Instructions: Please refer to Patient Instructions section of EMR for full details.  Patient was counseled important signs and symptoms that should prompt return to medical care, changes in medications, dietary instructions, activity restrictions, and follow up appointments.   Follow-Up Appointments: Contact information for after-discharge care    Destination    [facility] Preferred SNF .   Service: Skilled Nursing Contact information: [address] [place] [phone]               Gerlene Fee, DO 01/27/2019, 6:04 PM PGY-1, [place]

## 2019-01-27 NOTE — Progress Notes (Signed)
Progress Note  Patient Name: [name] Date of Encounter: 01/27/2019  Primary Cardiologist: Kirk Ruths, MD   Subjective   Feeling better, shortness of breath is much improved.  No chest pain.  Inpatient Medications    Scheduled Meds: . allopurinol  300 mg Oral Daily  . apixaban  5 mg Oral BID  . atorvastatin  80 mg Oral QHS  . brimonidine  1 drop Both Eyes BID  . digoxin  0.125 mg Oral Daily  . ferrous sulfate  325 mg Oral Q breakfast  . furosemide  80 mg Intravenous BID  . levalbuterol  0.63 mg Nebulization TID  . Melatonin  6 mg Oral QHS  . metoprolol tartrate  12.5 mg Oral BID  . multivitamin with minerals  1 tablet Oral Daily  . pantoprazole  40 mg Oral Daily  . potassium chloride  30 mEq Oral BID  . spironolactone  25 mg Oral Daily  . tamsulosin  0.4 mg Oral QPM  . umeclidinium bromide  1 puff Inhalation Daily   Continuous Infusions:  PRN Meds: acetaminophen **OR** acetaminophen, HYDROcodone-acetaminophen   Vital Signs    Vitals:   01/27/19 0438 01/27/19 0713 01/27/19 0842 01/27/19 1140  BP: (!) 107/55  (!) 114/59 115/60  Pulse: 85  85 90  Resp: 16   17  Temp: 98.3 F (36.8 C)   97.8 F (36.6 C)  TempSrc: Oral   Oral  SpO2: 100% 99% 100% 100%  Weight: 75.4 kg     Height:        Intake/Output Summary (Last 24 hours) at 01/27/2019 1203 Last data filed at 01/27/2019 1022 Gross per 24 hour  Intake 1137 ml  Output 3101 ml  Net -1964 ml   Last 3 Weights 01/27/2019 01/26/2019 01/25/2019  Weight (lbs) 166 lb 3.6 oz 166 lb 11.2 oz 168 lb 8 oz  Weight (kg) 75.4 kg 75.615 kg 76.431 kg      Telemetry    Atrial fibrillation with heart rates mostly in the 90s to 100s- Personally Reviewed  ECG    No new- Personally Reviewed  Physical Exam   GEN: No acute distress.  Elderly Neck: No JVD Cardiac: RRR, soft systolic murmur, no rubs, or gallops.  Respiratory: Clear to auscultation bilaterally. GI: Soft, nontender, non-distended  MS: No edema;  No deformity. Neuro:  Nonfocal  Psych: Normal affect   Labs    High Sensitivity Troponin:   Recent Labs  Lab 12/31/18 1635 01/01/19 0128 01/01/19 0344 01/01/19 0605 01/20/19 0023  TROPONINIHS 34* 27* 26* 22* 26*      Chemistry Recent Labs  Lab 01/25/19 0459 01/26/19 0526 01/27/19 0420  NA 133* 133* 133*  K 4.1 4.3 4.5  CL 92* 93* 94*  CO2 29 31 30   GLUCOSE 121* 125* 123*  BUN 12 15 17   CREATININE 0.69 0.78 0.87  CALCIUM 9.0 9.1 9.3  GFRNONAA >60 >60 >60  GFRAA >60 >60 >60  ANIONGAP 12 9 9      Hematology Recent Labs  Lab 01/21/19 0446 01/22/19 0601 01/26/19 1055  WBC 7.6 8.1 8.1  RBC 3.43* 3.54* 3.78*  HGB 9.3* 9.4* 10.0*  HCT 29.4* 30.2* 32.2*  MCV 85.7 85.3 85.2  MCH 27.1 26.6 26.5  MCHC 31.6 31.1 31.1  RDW 15.7* 15.6* 15.9*  PLT 386 394 371    BNPNo results for input(s): BNP, PROBNP in the last 168 hours.   DDimer No results for input(s): DDIMER in the last 168 hours.  Radiology    No results found.  Cardiac Studies   Echocardiogram 01/01/2019: 1. The left ventricle has low normal systolic function, with an ejection fraction of 50-55%. The cavity size was normal. There is mildly increased left ventricular wall thickness. Left ventricular diastolic Doppler parameters are indeterminate. There is abnormal septal motion consistent with post-operative status. 2. The right ventricle has mildly reduced systolic function. The cavity was normal. There is no increase in right ventricular wall thickness. Right ventricular systolic pressure is moderately elevated with an estimated pressure of 55.2 mmHg. 3. Left atrial size was severely dilated. 4. A 26 Edwards Sapien bioprosthetic aortic valve (TAVR) valve is present in the aortic position. Procedure Date: 09/17/17 Echo findings shows no evidence of rocking, dehiscence. Mean systolic gradient 11 mmHg, calculated valve area approximately 2 cm2  using LVOT diameter of 2.6 cm and LVOT TVI 14 cm. Likely  trivial prosthetic aortic valve regurgitation, though challenging to assess whether this is prosthetic or paravalvular due to image quality. 5. The mitral valve is abnormal. There is moderate mitral annular calcification present. Mitral valve regurgitation is mild to moderate by color flow Doppler. Moderate-severe mitral valve stenosis. Mean diastolic gradient 13 mmHg at HR 100-105. 6. Tricuspid valve regurgitation is moderate. 7. When compared to the prior study: 11/24/2018 - no significant change in LV function. No change in degree of mitral valve stenosis. Stable function of aortic prosthesis. Side by side comparison of images performed.  Patient Profile     85 y.o. male with past medical history of coronary artery disease status post coronary artery bypass graft, prior TAVR for aortic stenosis, permanent atrial fibrillation, mitral stenosis,andCOPDpresenting withacute on chronic diastolic congestive heart failure  Assessment & Plan    Acute on chronic diastolic heart failure -EF 55% - Negative net 10 L diuresis approximately 15 pounds -We will go ahead and transition over to p.o. Lasix 80 mg p.o. twice daily, previous home dose. -Continue to encourage low-salt, fluid restrictions of 1.5 L a day.  Persistent atrial fibrillation -Continue with digoxin as well as low-dose metoprolol.  Chronic anticoagulation -On Eliquis 5 mg twice a day.  Appropriately dosed.  Hyperlipidemia -Continue with atorvastatin 80 mg.  TAVR -Functioning well.  CAD status post CABG - Secondary risk factor prevention.  No angina.  Mitral stenosis - Previously described as moderate to severe.  He would not be a candidate for a mitral valve procedure.  Continue to maintain fluid status with diuretics.  Seems appropriate currently for discharge.  Current weight is 166 pounds.  Try to maintain this as outpatient.  [facility] will sign off.   Medication Recommendations: Transition to Lasix 80 mg p.o.  twice daily as he was taking at skilled nursing facility.  I have written order. Other recommendations (labs, testing, etc): Continue to monitor basic metabolic profile as outpatient Follow up as an outpatient: We will have outpatient follow-up as previously scheduled.  For questions or updates, please contact [place] Please consult [email or web address] for contact info under       Signed, Candee Furbish, MD  01/27/2019, 12:03 PM

## 2019-01-27 NOTE — Progress Notes (Addendum)
Family Medicine Teaching Service Daily Progress Note Intern Pager: [phone]  Patient name: [name] Medical record number: [id number] Date of birth: 1932/05/31 Age: 85 y.o. Gender: male  Primary Care Provider: Nicoletta Dress, MD Consultants: CCM Code Status: Full    Pt Overview and Major Events to Date:  Admitted 08/17.   Assessment and Plan: JAHZION BROGDEN is a 85 y.o. male presenting with shortness of breath. PMH is significant for CHF, atrial fibrillation, gout, type 2 diabetes, BPH, CAD, history of provoked PE.   SOB 2/2 B/L Pulmonary Effusions   On 3 L nasal cannula and room air saturation at 100%. Continue with diuresis and optimization of respiratory status.  -telemetry -strict BR -Continue strict I's and O's -PT /OT with improved dyspnea  -Continue IV Lasix 80mg  twice daily -continue to monitor creatine with BMP    Acute on Chronic HFpEF Patient reports that his SOB is resolved.  Likely contributed to mitral stenosis.  Stenosis is moderate to severe with regurgitation not a good candidate for mitral valve surgery per cardiology. 2.4L output yesterday.  Weight 75.4 kg from 76.6 kg previously Cr currently 0.87 from 0.78 previously   K is 4.5 from 4.3 Last mag level is 1.7 -Continue Digoxin 0.125mg , metoprolol 12.5mg  added back on  -Cardiac monitoring -Spironolactone 25 mg daily - IV lasix 80 twice daily      COPD  Received breathing treatment this morning, denies chest pain or chest tightness. SOB is resolved.  -Symbicort 80-4 0.5 MCG -Levalbuterol 0.63 mg TID -Ellipta 62.5 mcg 1 puff daily   Persistent atrial fibrillation, on eliquis Chronic and rate controlled. HR 85-92 overnight  -Continue Eliquis 5 mg twice daily -Continue digoxin 0.125mg  -Telemetry -INR is 1.9    IDDM, well controlled Blood glucose 123 in last 24 hours.  A1c 6.1 7/15 2020.  - holding home AM sliding scale  - hold home metformin  - goal CBG 140-180.  - page MD with CBG >  180 for insulin instruction  - AM BMP    GERD - substitute formulary Protonix 40 mg daily   BPH Reports no urinary symptoms as he has an indwelling catheter currently.  2.4L output overnight -Continue tamsulosin 0.4 mg daily - will need f/u at urology for cath    Asymptomatic Bacteriuria  Hemoglobinuria Patient with positive nitrites, bacteria, leuks on initial UA. WBC clumps, mucus, hyaline casts, amorphous crystal and triple phosphate crystal present as well. PH 9. > 50 RBC -will not treat as patient is asymptomatic will add on antibiotics if patient develops symptoms    CAD  HLD  Last lipid panel in 2014. No repeat at this time -Continue atorvastatin 80 mg at bedtime   Hypertension BP overnight 107-108/ 55-56. - hold benazepril 5 mg daily  - metoprolol once daily at home; 12.5mg  BID now    Constipation  hold home Dulcolax 5 mg daily & Polyethylene glycol 17 g twice daily given complaints of diarrhea recently    Chronic anemia 8/24 Hemoglobin 10  (9.8 at baseline), MCV 85.2. On home iron supplementation daily  - continue Ferrous sulfate 325 mg -CBC am   Chronic back pain At home, on Hydrocodone acetaminophen 5-325 mg every 4 PRN; will hold in setting of dyspnea - please notify MD for pain  - tylenol PRN    Gout - Continue allopurinol 300mg  daily   Pressure Ulcers, sacrum  - daily wound care  - foam dressing to sacrum daily  - q2 hour bed shifting  and readjustment    Goals of care - Continue to diurese per cardiology - Palliative benefit for outpatient palliative care    FEN/GI: Modified carb diet, Protonix 40 mg PPx: Patient on Eliquis    Disposition: Return to [facility] pending clearance from cardiology   Subjective:  He is sitting in bed.  He denies chest pain chest tightness and shortness of breath.  He has just received a breathing treatment and says he feels wonderful today.  Objective: Temp:  [97.7 F (36.5 C)-98.3 F (36.8 C)] 98.3 F (36.8 C)  (08/25 0438) Pulse Rate:  [81-92] 85 (08/25 0438) Resp:  [15-18] 16 (08/25 0438) BP: (102-120)/(55-63) 107/55 (08/25 0438) SpO2:  [99 %-100 %] 100 % (08/25 0438) Weight:  [75.4 kg] 75.4 kg (08/25 0438)  Physical Exam: General: Appears well, no acute distress. Age appropriate. Cardiac: RR irregular rhythm, heart sounds present, no murmurs Respiratory: CTAB, increased respiratory effort Abdomen: soft, nontender, nondistended Extremities: No edema or cyanosis. Skin: Warm and dry, no rashes noted Neuro: alert and oriented, no focal deficits Psych: normal affect   Laboratory: Recent Labs  Lab 01/21/19 0446 01/22/19 0601 01/26/19 1055  WBC 7.6 8.1 8.1  HGB 9.3* 9.4* 10.0*  HCT 29.4* 30.2* 32.2*  PLT 386 394 371   Recent Labs  Lab 01/25/19 0459 01/26/19 0526 01/27/19 0420  NA 133* 133* 133*  K 4.1 4.3 4.5  CL 92* 93* 94*  CO2 29 31 30   BUN 12 15 17   CREATININE 0.69 0.78 0.87  CALCIUM 9.0 9.1 9.3  GLUCOSE 121* 125* 123*    Imaging/Diagnostic Tests: No new imaging. Please refer to the chart for prior imaging.   Gerlene Fee, DO 01/27/2019, 1:36 PM PGY-1, [place] Intern pager: [phone], text pages welcome

## 2019-01-27 NOTE — Progress Notes (Signed)
Foley Catheter changed per MD  order F#18. Urine return noted, cloudy  milky with sediments.

## 2019-01-28 DIAGNOSIS — L03114 Cellulitis of left upper limb: Secondary | ICD-10-CM | POA: Diagnosis not present

## 2019-01-28 DIAGNOSIS — L89151 Pressure ulcer of sacral region, stage 1: Secondary | ICD-10-CM | POA: Diagnosis not present

## 2019-01-28 DIAGNOSIS — K219 Gastro-esophageal reflux disease without esophagitis: Secondary | ICD-10-CM | POA: Diagnosis not present

## 2019-01-28 DIAGNOSIS — K5900 Constipation, unspecified: Secondary | ICD-10-CM | POA: Diagnosis not present

## 2019-01-28 DIAGNOSIS — R5381 Other malaise: Secondary | ICD-10-CM | POA: Diagnosis not present

## 2019-01-28 DIAGNOSIS — M1990 Unspecified osteoarthritis, unspecified site: Secondary | ICD-10-CM | POA: Diagnosis not present

## 2019-01-28 DIAGNOSIS — M2550 Pain in unspecified joint: Secondary | ICD-10-CM | POA: Diagnosis not present

## 2019-01-28 DIAGNOSIS — I2510 Atherosclerotic heart disease of native coronary artery without angina pectoris: Secondary | ICD-10-CM | POA: Diagnosis not present

## 2019-01-28 DIAGNOSIS — I2699 Other pulmonary embolism without acute cor pulmonale: Secondary | ICD-10-CM | POA: Diagnosis not present

## 2019-01-28 DIAGNOSIS — R823 Hemoglobinuria: Secondary | ICD-10-CM | POA: Diagnosis not present

## 2019-01-28 DIAGNOSIS — Z7401 Bed confinement status: Secondary | ICD-10-CM | POA: Diagnosis not present

## 2019-01-28 DIAGNOSIS — R8271 Bacteriuria: Secondary | ICD-10-CM | POA: Diagnosis not present

## 2019-01-28 DIAGNOSIS — Z20828 Contact with and (suspected) exposure to other viral communicable diseases: Secondary | ICD-10-CM | POA: Diagnosis not present

## 2019-01-28 DIAGNOSIS — I4891 Unspecified atrial fibrillation: Secondary | ICD-10-CM | POA: Diagnosis not present

## 2019-01-28 DIAGNOSIS — G4700 Insomnia, unspecified: Secondary | ICD-10-CM | POA: Diagnosis not present

## 2019-01-28 DIAGNOSIS — R0602 Shortness of breath: Secondary | ICD-10-CM | POA: Diagnosis not present

## 2019-01-28 DIAGNOSIS — I10 Essential (primary) hypertension: Secondary | ICD-10-CM | POA: Diagnosis not present

## 2019-01-28 DIAGNOSIS — M549 Dorsalgia, unspecified: Secondary | ICD-10-CM | POA: Diagnosis not present

## 2019-01-28 DIAGNOSIS — N400 Enlarged prostate without lower urinary tract symptoms: Secondary | ICD-10-CM | POA: Diagnosis not present

## 2019-01-28 DIAGNOSIS — H409 Unspecified glaucoma: Secondary | ICD-10-CM | POA: Diagnosis not present

## 2019-01-28 DIAGNOSIS — E1151 Type 2 diabetes mellitus with diabetic peripheral angiopathy without gangrene: Secondary | ICD-10-CM | POA: Diagnosis not present

## 2019-01-28 DIAGNOSIS — E785 Hyperlipidemia, unspecified: Secondary | ICD-10-CM | POA: Diagnosis not present

## 2019-01-28 DIAGNOSIS — E46 Unspecified protein-calorie malnutrition: Secondary | ICD-10-CM | POA: Diagnosis not present

## 2019-01-28 DIAGNOSIS — J449 Chronic obstructive pulmonary disease, unspecified: Secondary | ICD-10-CM | POA: Diagnosis not present

## 2019-01-28 DIAGNOSIS — J90 Pleural effusion, not elsewhere classified: Secondary | ICD-10-CM | POA: Diagnosis not present

## 2019-01-28 DIAGNOSIS — Z9889 Other specified postprocedural states: Secondary | ICD-10-CM | POA: Diagnosis not present

## 2019-01-28 DIAGNOSIS — I5033 Acute on chronic diastolic (congestive) heart failure: Secondary | ICD-10-CM | POA: Diagnosis not present

## 2019-01-28 DIAGNOSIS — I959 Hypotension, unspecified: Secondary | ICD-10-CM | POA: Diagnosis not present

## 2019-01-28 DIAGNOSIS — R0689 Other abnormalities of breathing: Secondary | ICD-10-CM | POA: Diagnosis not present

## 2019-01-28 DIAGNOSIS — G8929 Other chronic pain: Secondary | ICD-10-CM | POA: Diagnosis not present

## 2019-01-28 DIAGNOSIS — M109 Gout, unspecified: Secondary | ICD-10-CM | POA: Diagnosis not present

## 2019-01-28 DIAGNOSIS — E119 Type 2 diabetes mellitus without complications: Secondary | ICD-10-CM | POA: Diagnosis not present

## 2019-01-28 DIAGNOSIS — Z7901 Long term (current) use of anticoagulants: Secondary | ICD-10-CM | POA: Diagnosis not present

## 2019-01-28 DIAGNOSIS — I5032 Chronic diastolic (congestive) heart failure: Secondary | ICD-10-CM | POA: Diagnosis not present

## 2019-01-28 DIAGNOSIS — D649 Anemia, unspecified: Secondary | ICD-10-CM | POA: Diagnosis not present

## 2019-01-28 MED ORDER — DIGOXIN 125 MCG PO TABS: 0.1250 mg | ORAL_TABLET | Freq: Every day | ORAL | Status: AC

## 2019-01-28 MED ORDER — METOPROLOL TARTRATE 25 MG PO TABS: 12.5000 mg | ORAL_TABLET | Freq: Two times a day (BID) | ORAL | Status: DC

## 2019-01-28 MED ORDER — POTASSIUM CHLORIDE CRYS ER 15 MEQ PO TBCR: 30.0000 meq | EXTENDED_RELEASE_TABLET | Freq: Two times a day (BID) | ORAL | Status: AC

## 2019-01-28 NOTE — Progress Notes (Signed)
Patient discharge report given to nurse Olen Cordial at [facility].  All questions answered.  Marcille Blanco, RN

## 2019-01-28 NOTE — TOC Transition Note (Addendum)
Transition of Care [facility]) - CM/SW Discharge Note   Patient Details  Name: [name] MRN: [id number] Date of Birth: [birth year]  Transition of Care [facility]) CM/SW Contact:  Alberteen Sam, LCSW Phone Number: 01/28/2019, 2:12 PM   Clinical Narrative:     Patient will DC to: [facility] Anticipated DC date: 01/28/2019 Family notified: [name] (spouse) Transport by: Corey Harold  Per MD patient ready for DC to [facility] . RN, patient, patient's family, and facility notified of DC. Discharge Summary sent to facility. RN given number for report   [id number] Room 307B. DC packet on chart. Ambulance transport requested for patient.  CSW signing off.  [place], [place]   Final next level of care: Skilled Nursing Facility Barriers to Discharge: No Barriers Identified   Patient Goals and CMS Choice Patient states their goals for this hospitalization and ongoing recovery are:: to go back to [facility] for continued rehab CMS Medicare.gov Compare Post Acute Care list provided to:: Patient Represenative (must comment)([name] (spouse)) Choice offered to / list presented to : Spouse  Discharge Placement PASRR number recieved: 01/20/19            Patient chooses bed at: [facility] Patient to be transferred to facility by: [place] Name of family member notified: [name] Patient and family notified of of transfer: 01/28/19  Discharge Plan and Services     Post Acute Care Choice: [place]                               Social Determinants of Health (SDOH) Interventions     Readmission Risk Interventions No flowsheet data found.

## 2019-01-28 NOTE — TOC Progression Note (Signed)
Transition of Care [facility]) - Progression Note    Patient Details  Name: [name] MRN: [id number] Date of Birth: 12-Oct-1931  Transition of Care [facility]) CM/SW Contact  Zenon Mayo, RN Phone Number: 01/28/2019, 8:36 AM  Clinical Narrative:    [facility] team continues to follow for TOC needs.   Expected Discharge Plan: [place] Barriers to Discharge: Continued Medical Work up  Expected Discharge Plan and Services Expected Discharge Plan: [place] Choice: [place] arrangements for the past 2 months: Skilled Nursing Facility(From [facility] for short term rehab)                                       Social Determinants of Health (SDOH) Interventions    Readmission Risk Interventions No flowsheet data found.

## 2019-01-29 ENCOUNTER — Other Ambulatory Visit: Payer: Self-pay | Admitting: *Deleted

## 2019-01-29 NOTE — Patient Outreach (Signed)
Member assessed for potential [facility] Care Management needs as a benefit of  [place] Medicare.  Member is currently receiving rehab therapy at [facility]  Member discussed in weekly telephonic IDT meeting with facility staff, [facility] UM team, and writer.  Discussed that palliative services was ordered from hospital discharge summary. Facility states they will speak with wife before requesting palliative.  Will continue to follow for disposition plans, progression, and for potential [facility] Care Management needs.   Marthenia Rolling, MSN-Ed, RN,BSN [place] Acute Care Coordinator [phone] [facility]) [phone]  (Toll free office)

## 2019-02-01 DIAGNOSIS — K5900 Constipation, unspecified: Secondary | ICD-10-CM | POA: Diagnosis not present

## 2019-02-01 DIAGNOSIS — E46 Unspecified protein-calorie malnutrition: Secondary | ICD-10-CM | POA: Diagnosis not present

## 2019-02-01 DIAGNOSIS — I2699 Other pulmonary embolism without acute cor pulmonale: Secondary | ICD-10-CM | POA: Diagnosis not present

## 2019-02-01 DIAGNOSIS — J449 Chronic obstructive pulmonary disease, unspecified: Secondary | ICD-10-CM | POA: Diagnosis not present

## 2019-02-01 DIAGNOSIS — L89151 Pressure ulcer of sacral region, stage 1: Secondary | ICD-10-CM | POA: Diagnosis not present

## 2019-02-01 DIAGNOSIS — I5032 Chronic diastolic (congestive) heart failure: Secondary | ICD-10-CM | POA: Diagnosis not present

## 2019-02-01 DIAGNOSIS — M109 Gout, unspecified: Secondary | ICD-10-CM | POA: Diagnosis not present

## 2019-02-01 DIAGNOSIS — I2510 Atherosclerotic heart disease of native coronary artery without angina pectoris: Secondary | ICD-10-CM | POA: Diagnosis not present

## 2019-02-01 DIAGNOSIS — E1151 Type 2 diabetes mellitus with diabetic peripheral angiopathy without gangrene: Secondary | ICD-10-CM | POA: Diagnosis not present

## 2019-02-04 ENCOUNTER — Other Ambulatory Visit: Payer: Self-pay | Admitting: *Deleted

## 2019-02-04 NOTE — Patient Outreach (Signed)
Member assessed for potential [facility] Care Management needs as a benefit of  [place] Medicare.  Collaboration with [facility] UM RN. Writer made aware that, per facility, [name] is slated for dc on 02/07/19.  Member is currently receiving rehab therapy at [facility].  Telephone call made to [name] at [phone] to discuss [facility] [place] Management follow up. However, there was no answer. Left HIPAA compliant voicemail message to request call back.   Will continue to follow for disposition plans. Will continue to collaborate with [facility] UM team and facility while [name] is in SNF.   Marthenia Rolling, MSN-Ed, RN,BSN [place] Acute Care Coordinator [phone] [facility]) [phone]  (Toll free office)

## 2019-02-05 ENCOUNTER — Other Ambulatory Visit: Payer: Self-pay | Admitting: *Deleted

## 2019-02-05 ENCOUNTER — Telehealth: Payer: Self-pay | Admitting: Cardiology

## 2019-02-05 NOTE — Patient Outreach (Signed)
Telephone call made to [facility] (outpatient home based palliative care program administered by [facility]) at [phone]. Spoke with Cheri to make referral. Made Cheri aware that Mr. Paternoster is scheduled for dc from [facility] on 02/07/19. Discussed that Mrs. Polivka was agreeable to [facility] referral. Provided necessary information for referral. Also discussed that member has been resistant to hospice in the past. However, ongoing goals of care discussions are really needed.  Telephone call made to Primary Care Dr. Gery Pray office to make aware of Mr. Kafer's pending dc from [facility] on 02/07/19. Spoke with [employer].   Telephone call made to Dr. Jacalyn Lefevre cardiology office to make of Mr. Kosta's pending dc on Saturday. Wanted to make aware since his appointment is not until December. Mr. Todorov is a very high risk for readmission. Christena Deem took message for nurse to call writer back.  Will update [facility] UM team and facility of writer's follow up calls.    Marthenia Rolling, MSN-Ed, RN,BSN [place] Acute Care Coordinator [phone] [facility]) [phone]  (Toll free office)

## 2019-02-05 NOTE — Telephone Encounter (Signed)
New Message:    Please call Takia from [facility]. She wants to give you an update on pt's condition.t

## 2019-02-05 NOTE — Patient Outreach (Signed)
Member assessed for potential [facility] Care Management needs as a benefit of  [place] Medicare.  Member is currently receiving rehab therapy at [facility].  Member discussed in weekly telephonic IDT meeting with facility staff, [facility] UM RN, [facility] MD Director, and writer.  Facility reports Mr. Harriott is slated for dc on 02/07/19 to home with wife. He will have home health but not sure which agency currently. Extensive discussion around palliative care follow up. States wife and member have wanted aggressive care thus far and can be a little unrealistic regarding goals of care. Mr. Desa has had frequent thoracentesis, multiple hospitalizations and SNF stays.   Discussed that Probation officer will outreach to Mrs. Agro about [place] Management as well as potential palliative care referral. Also discussed that writer will make PCP and cardiologist office aware of Mr. Bussey's pending dc from SNF on Saturday.  Will plan outreach to Mrs. Castronovo regarding potential [facility] Care Management services and outpatient palliative care referral.  Appreciative of all of the collaboration on this member.   Marthenia Rolling, MSN-Ed, RN,BSN [place] Acute Care Coordinator [phone] [facility]) [phone]  (Toll free office)

## 2019-02-05 NOTE — Patient Outreach (Signed)
Telephone call received from [facility] (wife). States she was returning writer's call from yesterday. Patient identifiers confirmed.   Discussed [facility] Care Management program services. Mrs. Delapena is agreeable and reports she is member's primary contact and caregiver. Mrs. Arave states member has been in the hospital and in rehab since June. She also reports that they were told that he will not continue having thoracentesis due to his age and the like. States they are trying to manage fluid build up with medications.  Writer discussed outpatient palliative care follow up for ongoing goals of care conversations. Mrs. Zaccone is agreeable to writer making a referral to [facility] as they provide home based outpatient palliative care services in [facility]. Note neither member or wife have been receptive to hospice in the recent past. Confirmed address (po box is listed in chart) as [address] [place], [place] [address].  Explained that none of these services will interfere with home health services. Mrs. Rahimi reports they have all of the DME they need with the exception of a wheelchair. Mrs. Haisley states she can get one however. Declined writer passing along to facility that a wheelchair is needed.   Mrs. Lepera also states member will not be home alone at any time. States "if I have to go to the store or the doctor, I have babysitters lined up." States "he will come home with a foley cathter and will have to follow up with urology as well." Mrs. Beegle states " I am an old nurse so I can take care of him except for changing a catheter."  Confirmed best contact information. Also provided [facility] Care Management and writer's contact information.   Will plan to make [place] Management referral for follow up in case member does not fully engage with outpatient palliative care services.   Will make PCP and Cardiology offices aware of Mr. Boxx's pending dc from  SNF on 02/07/19. Will make referral to home base outpatient palliative care program- [facility].  Will continue to collaborate with [facility] UM team and facility on member while in SNF. Will follow up on which home health agency will be arranged.    Marthenia Rolling, MSN-Ed, RN,BSN [place] Acute Care Coordinator [phone] [facility] [phone]  (Toll free office)

## 2019-02-05 NOTE — Telephone Encounter (Signed)
Left message for Orrin Brigham to call

## 2019-02-08 DIAGNOSIS — K219 Gastro-esophageal reflux disease without esophagitis: Secondary | ICD-10-CM | POA: Diagnosis not present

## 2019-02-08 DIAGNOSIS — M109 Gout, unspecified: Secondary | ICD-10-CM | POA: Diagnosis not present

## 2019-02-08 DIAGNOSIS — D649 Anemia, unspecified: Secondary | ICD-10-CM | POA: Diagnosis not present

## 2019-02-08 DIAGNOSIS — I700 Atherosclerosis of aorta: Secondary | ICD-10-CM | POA: Diagnosis not present

## 2019-02-08 DIAGNOSIS — I110 Hypertensive heart disease with heart failure: Secondary | ICD-10-CM | POA: Diagnosis not present

## 2019-02-08 DIAGNOSIS — M810 Age-related osteoporosis without current pathological fracture: Secondary | ICD-10-CM | POA: Diagnosis not present

## 2019-02-08 DIAGNOSIS — I4891 Unspecified atrial fibrillation: Secondary | ICD-10-CM | POA: Diagnosis not present

## 2019-02-08 DIAGNOSIS — K5900 Constipation, unspecified: Secondary | ICD-10-CM | POA: Diagnosis not present

## 2019-02-08 DIAGNOSIS — G8929 Other chronic pain: Secondary | ICD-10-CM | POA: Diagnosis not present

## 2019-02-08 DIAGNOSIS — E119 Type 2 diabetes mellitus without complications: Secondary | ICD-10-CM | POA: Diagnosis not present

## 2019-02-08 DIAGNOSIS — H409 Unspecified glaucoma: Secondary | ICD-10-CM | POA: Diagnosis not present

## 2019-02-08 DIAGNOSIS — Z9981 Dependence on supplemental oxygen: Secondary | ICD-10-CM | POA: Diagnosis not present

## 2019-02-08 DIAGNOSIS — I5033 Acute on chronic diastolic (congestive) heart failure: Secondary | ICD-10-CM | POA: Diagnosis not present

## 2019-02-08 DIAGNOSIS — M4854XD Collapsed vertebra, not elsewhere classified, thoracic region, subsequent encounter for fracture with routine healing: Secondary | ICD-10-CM | POA: Diagnosis not present

## 2019-02-08 DIAGNOSIS — Z7984 Long term (current) use of oral hypoglycemic drugs: Secondary | ICD-10-CM | POA: Diagnosis not present

## 2019-02-08 DIAGNOSIS — I2510 Atherosclerotic heart disease of native coronary artery without angina pectoris: Secondary | ICD-10-CM | POA: Diagnosis not present

## 2019-02-08 DIAGNOSIS — Z466 Encounter for fitting and adjustment of urinary device: Secondary | ICD-10-CM | POA: Diagnosis not present

## 2019-02-08 DIAGNOSIS — M1990 Unspecified osteoarthritis, unspecified site: Secondary | ICD-10-CM | POA: Diagnosis not present

## 2019-02-08 DIAGNOSIS — N138 Other obstructive and reflux uropathy: Secondary | ICD-10-CM | POA: Diagnosis not present

## 2019-02-08 DIAGNOSIS — Z7901 Long term (current) use of anticoagulants: Secondary | ICD-10-CM | POA: Diagnosis not present

## 2019-02-08 DIAGNOSIS — E785 Hyperlipidemia, unspecified: Secondary | ICD-10-CM | POA: Diagnosis not present

## 2019-02-08 DIAGNOSIS — I252 Old myocardial infarction: Secondary | ICD-10-CM | POA: Diagnosis not present

## 2019-02-08 DIAGNOSIS — N401 Enlarged prostate with lower urinary tract symptoms: Secondary | ICD-10-CM | POA: Diagnosis not present

## 2019-02-08 DIAGNOSIS — E43 Unspecified severe protein-calorie malnutrition: Secondary | ICD-10-CM | POA: Diagnosis not present

## 2019-02-08 DIAGNOSIS — J441 Chronic obstructive pulmonary disease with (acute) exacerbation: Secondary | ICD-10-CM | POA: Diagnosis not present

## 2019-02-10 ENCOUNTER — Other Ambulatory Visit: Payer: Self-pay | Admitting: *Deleted

## 2019-02-10 DIAGNOSIS — I10 Essential (primary) hypertension: Secondary | ICD-10-CM

## 2019-02-10 NOTE — Patient Outreach (Signed)
Verified in Patient [place] that Mr. Brandenberger discharged from [facility] on 02/07/19.   Per facility dc planner, Mr. Summons will have home health thru [place].  Spoke with Manus Gunning with [facility]. They are awaiting to hear back response from PCP regarding palliative care engagement order.  Will make referral to [place] for complex case management services.    Marthenia Rolling, MSN-Ed, RN,BSN [place] Acute Care Coordinator [phone] [facility]) [phone]  (Toll free office)

## 2019-02-11 ENCOUNTER — Other Ambulatory Visit: Payer: Self-pay

## 2019-02-11 NOTE — Patient Outreach (Signed)
[facility]) Care Management  02/11/2019  [name] 02-Oct-1931 [id number]     Transition of Care Referral  Referral Date: 02/11/2019  Referral Source: [facility] Date of Admission: 01/28/2019 Diagnosis: "CHF" Date of Discharge: 02/07/2019 Facility: [facility] Insurance:Medicare    Outreach attempt # 1 to patient.   Spoke with spouse(DPR on file) and call completed. Spouse states that patient is doing fairly well since returning home from SNF. She voices that patient is forgetful and has trouble comprehending at times which is why she handles his medical affairs. She voices that patient's appetite is not good. She is supplementing his diet by giving him tow cans of Ensure/day. She voices that when he went into the hospital he weighed about 192 lbs. She voices that his weight this morning was 169 lbs.  Pain does not typically complain of pain. Souse states that occasionally he will complain of pain to his right foot heel. This is an  rea that she accidentally "rubbed too hard" and is applying cream to affected area. Patient requires assistance with all ADLs/IADLs. Spouse denies any recent falls. She reports that she has supportive son and daughter who is helping her out to care for patient. Spouse states that they do not have a way to get patient out of the home due to no ramp and multiple steps outside of the home. She is looking for resources on ramp and/or in home modifications. RN CM dicussed with spouse [facility] SW referral and she is agreeable to this.   Conditions: Per chart review, patient has PMH of pulmonary HTN, CHF, COPD and DM. Spouse is monitoring cbgs in the home. She voices cbgs ranging from the 120s-150s. Patient is using oxygen at 3L/min via [place]. Spouse voices that patient has had multiple thoracentesis and fluid removal procedures. However, she was told during last hospitalization that patient is no longer a candidate to continue to receive these given his age and  co-morbidities. He is taking high dose of Lasix at present. She denies any abnormal swelling at present. Patient has foley catheter and spouse voices patient is putting out good urine output. Spouse is adamant that she does not plan to allow patient to go back to another facility. She desires to keep him at home. [facility] referral is pending. Spouse voice that [facility] services are in place and has been out to evaluate patient already.   Medications: Med review completed with spouse. Spouse is managing patient's meds. She denies any issues affording and/or managing patient's meds at this time.   Advance Directives: Copy on file and in chart.  Appointment: Patient has face to face PCP visit later this week.  Consent: [facility] services reviewed and discussed with spouse. Verbal consent for services given.   Plan: RN CM will make outreach attempt to patient within one week. RN CM will send [facility] SW referral for possible assistance with community resources. RN CM will send successful outreach letter to patient/spouse.   Enzo Montgomery, RN,BSN,CCM [place] [facility] Telephonic Care Management Coordinator Direct Phone: [phone] Toll Free: [phone] Fax: [phone]

## 2019-02-12 ENCOUNTER — Other Ambulatory Visit: Payer: Self-pay

## 2019-02-12 DIAGNOSIS — I5033 Acute on chronic diastolic (congestive) heart failure: Secondary | ICD-10-CM | POA: Diagnosis not present

## 2019-02-12 DIAGNOSIS — E119 Type 2 diabetes mellitus without complications: Secondary | ICD-10-CM | POA: Diagnosis not present

## 2019-02-12 DIAGNOSIS — I2510 Atherosclerotic heart disease of native coronary artery without angina pectoris: Secondary | ICD-10-CM | POA: Diagnosis not present

## 2019-02-12 DIAGNOSIS — J441 Chronic obstructive pulmonary disease with (acute) exacerbation: Secondary | ICD-10-CM | POA: Diagnosis not present

## 2019-02-12 DIAGNOSIS — I110 Hypertensive heart disease with heart failure: Secondary | ICD-10-CM | POA: Diagnosis not present

## 2019-02-12 DIAGNOSIS — Z466 Encounter for fitting and adjustment of urinary device: Secondary | ICD-10-CM | POA: Diagnosis not present

## 2019-02-12 NOTE — Patient Outreach (Signed)
[facility]) Care Management  02/12/2019  [name] 1932-02-01 [id number]   Social work referral received from [facility] [place].  "Spouse looking for resources/assistance for home modifications to assist with ramp placement". Successful outreach to patient's spouse today.  Informed her about ramp assistance with [place] [employer] an [place].  Declined referral to Independent Living because they will require verification of income.  Three way call conducted to [facility] and referral submitted.  Paperwork will be mailed to patient.  Will need to be mailed back to [facility] once completed.  Will follow up within the next two weeks to ensure receipt of paperwork.  Ronn Melena, BSW Social Worker [phone]

## 2019-02-13 DIAGNOSIS — T839XXA Unspecified complication of genitourinary prosthetic device, implant and graft, initial encounter: Secondary | ICD-10-CM | POA: Diagnosis not present

## 2019-02-13 DIAGNOSIS — Z23 Encounter for immunization: Secondary | ICD-10-CM | POA: Diagnosis not present

## 2019-02-13 DIAGNOSIS — I4891 Unspecified atrial fibrillation: Secondary | ICD-10-CM | POA: Diagnosis not present

## 2019-02-13 DIAGNOSIS — I5033 Acute on chronic diastolic (congestive) heart failure: Secondary | ICD-10-CM | POA: Diagnosis not present

## 2019-02-13 DIAGNOSIS — E1142 Type 2 diabetes mellitus with diabetic polyneuropathy: Secondary | ICD-10-CM | POA: Diagnosis not present

## 2019-02-17 ENCOUNTER — Telehealth: Payer: Self-pay

## 2019-02-17 ENCOUNTER — Inpatient Hospital Stay (HOSPITAL_COMMUNITY)
Admission: EM | Admit: 2019-02-17 | Discharge: 2019-02-22 | DRG: 292 | Disposition: A | Discharge status: Home / Self Care | Payer: Medicare Other | Attending: Internal Medicine | Admitting: Internal Medicine

## 2019-02-17 ENCOUNTER — Emergency Department (HOSPITAL_COMMUNITY): Payer: Medicare Other

## 2019-02-17 ENCOUNTER — Other Ambulatory Visit: Payer: Self-pay

## 2019-02-17 ENCOUNTER — Telehealth: Payer: Self-pay | Admitting: Cardiology

## 2019-02-17 ENCOUNTER — Other Ambulatory Visit: Payer: Self-pay | Admitting: *Deleted

## 2019-02-17 ENCOUNTER — Encounter (HOSPITAL_COMMUNITY): Payer: Self-pay

## 2019-02-17 ENCOUNTER — Ambulatory Visit: Payer: Medicare Other | Admitting: Physician Assistant

## 2019-02-17 DIAGNOSIS — D649 Anemia, unspecified: Secondary | ICD-10-CM | POA: Diagnosis not present

## 2019-02-17 DIAGNOSIS — I509 Heart failure, unspecified: Secondary | ICD-10-CM

## 2019-02-17 DIAGNOSIS — I4811 Longstanding persistent atrial fibrillation: Secondary | ICD-10-CM | POA: Diagnosis not present

## 2019-02-17 DIAGNOSIS — I959 Hypotension, unspecified: Secondary | ICD-10-CM | POA: Diagnosis not present

## 2019-02-17 DIAGNOSIS — I25110 Atherosclerotic heart disease of native coronary artery with unstable angina pectoris: Secondary | ICD-10-CM | POA: Diagnosis present

## 2019-02-17 DIAGNOSIS — J9611 Chronic respiratory failure with hypoxia: Secondary | ICD-10-CM | POA: Diagnosis not present

## 2019-02-17 DIAGNOSIS — Z466 Encounter for fitting and adjustment of urinary device: Secondary | ICD-10-CM | POA: Diagnosis not present

## 2019-02-17 DIAGNOSIS — R0600 Dyspnea, unspecified: Secondary | ICD-10-CM

## 2019-02-17 DIAGNOSIS — Z952 Presence of prosthetic heart valve: Secondary | ICD-10-CM

## 2019-02-17 DIAGNOSIS — R531 Weakness: Secondary | ICD-10-CM

## 2019-02-17 DIAGNOSIS — E871 Hypo-osmolality and hyponatremia: Secondary | ICD-10-CM | POA: Diagnosis not present

## 2019-02-17 DIAGNOSIS — I5033 Acute on chronic diastolic (congestive) heart failure: Secondary | ICD-10-CM | POA: Diagnosis present

## 2019-02-17 DIAGNOSIS — M351 Other overlap syndromes: Secondary | ICD-10-CM | POA: Diagnosis present

## 2019-02-17 DIAGNOSIS — K219 Gastro-esophageal reflux disease without esophagitis: Secondary | ICD-10-CM | POA: Diagnosis present

## 2019-02-17 DIAGNOSIS — I5031 Acute diastolic (congestive) heart failure: Secondary | ICD-10-CM | POA: Diagnosis not present

## 2019-02-17 DIAGNOSIS — Z20828 Contact with and (suspected) exposure to other viral communicable diseases: Secondary | ICD-10-CM | POA: Diagnosis present

## 2019-02-17 DIAGNOSIS — I4821 Permanent atrial fibrillation: Secondary | ICD-10-CM | POA: Diagnosis present

## 2019-02-17 DIAGNOSIS — Z7901 Long term (current) use of anticoagulants: Secondary | ICD-10-CM

## 2019-02-17 DIAGNOSIS — Z8711 Personal history of peptic ulcer disease: Secondary | ICD-10-CM

## 2019-02-17 DIAGNOSIS — I272 Pulmonary hypertension, unspecified: Secondary | ICD-10-CM | POA: Diagnosis not present

## 2019-02-17 DIAGNOSIS — Z794 Long term (current) use of insulin: Secondary | ICD-10-CM | POA: Diagnosis not present

## 2019-02-17 DIAGNOSIS — E785 Hyperlipidemia, unspecified: Secondary | ICD-10-CM | POA: Diagnosis present

## 2019-02-17 DIAGNOSIS — Z8249 Family history of ischemic heart disease and other diseases of the circulatory system: Secondary | ICD-10-CM

## 2019-02-17 DIAGNOSIS — Z881 Allergy status to other antibiotic agents status: Secondary | ICD-10-CM | POA: Diagnosis not present

## 2019-02-17 DIAGNOSIS — I10 Essential (primary) hypertension: Secondary | ICD-10-CM | POA: Diagnosis present

## 2019-02-17 DIAGNOSIS — Z978 Presence of other specified devices: Secondary | ICD-10-CM

## 2019-02-17 DIAGNOSIS — E1151 Type 2 diabetes mellitus with diabetic peripheral angiopathy without gangrene: Secondary | ICD-10-CM | POA: Diagnosis present

## 2019-02-17 DIAGNOSIS — I5042 Chronic combined systolic (congestive) and diastolic (congestive) heart failure: Secondary | ICD-10-CM

## 2019-02-17 DIAGNOSIS — I252 Old myocardial infarction: Secondary | ICD-10-CM | POA: Diagnosis not present

## 2019-02-17 DIAGNOSIS — Z951 Presence of aortocoronary bypass graft: Secondary | ICD-10-CM

## 2019-02-17 DIAGNOSIS — G4733 Obstructive sleep apnea (adult) (pediatric): Secondary | ICD-10-CM | POA: Diagnosis present

## 2019-02-17 DIAGNOSIS — M109 Gout, unspecified: Secondary | ICD-10-CM | POA: Diagnosis present

## 2019-02-17 DIAGNOSIS — I2510 Atherosclerotic heart disease of native coronary artery without angina pectoris: Secondary | ICD-10-CM | POA: Diagnosis not present

## 2019-02-17 DIAGNOSIS — E1159 Type 2 diabetes mellitus with other circulatory complications: Secondary | ICD-10-CM | POA: Diagnosis present

## 2019-02-17 DIAGNOSIS — I4819 Other persistent atrial fibrillation: Secondary | ICD-10-CM | POA: Diagnosis not present

## 2019-02-17 DIAGNOSIS — Z9981 Dependence on supplemental oxygen: Secondary | ICD-10-CM | POA: Diagnosis not present

## 2019-02-17 DIAGNOSIS — I480 Paroxysmal atrial fibrillation: Secondary | ICD-10-CM | POA: Diagnosis not present

## 2019-02-17 DIAGNOSIS — I5043 Acute on chronic combined systolic (congestive) and diastolic (congestive) heart failure: Secondary | ICD-10-CM | POA: Diagnosis present

## 2019-02-17 DIAGNOSIS — I447 Left bundle-branch block, unspecified: Secondary | ICD-10-CM | POA: Diagnosis present

## 2019-02-17 DIAGNOSIS — R0602 Shortness of breath: Secondary | ICD-10-CM | POA: Diagnosis not present

## 2019-02-17 DIAGNOSIS — E119 Type 2 diabetes mellitus without complications: Secondary | ICD-10-CM | POA: Diagnosis not present

## 2019-02-17 DIAGNOSIS — Z8349 Family history of other endocrine, nutritional and metabolic diseases: Secondary | ICD-10-CM

## 2019-02-17 DIAGNOSIS — I4891 Unspecified atrial fibrillation: Secondary | ICD-10-CM | POA: Diagnosis present

## 2019-02-17 DIAGNOSIS — J449 Chronic obstructive pulmonary disease, unspecified: Secondary | ICD-10-CM | POA: Diagnosis present

## 2019-02-17 DIAGNOSIS — Z66 Do not resuscitate: Secondary | ICD-10-CM | POA: Diagnosis present

## 2019-02-17 DIAGNOSIS — Z515 Encounter for palliative care: Secondary | ICD-10-CM | POA: Diagnosis not present

## 2019-02-17 DIAGNOSIS — R001 Bradycardia, unspecified: Secondary | ICD-10-CM | POA: Diagnosis not present

## 2019-02-17 DIAGNOSIS — R195 Other fecal abnormalities: Secondary | ICD-10-CM | POA: Diagnosis not present

## 2019-02-17 DIAGNOSIS — J411 Mucopurulent chronic bronchitis: Secondary | ICD-10-CM | POA: Diagnosis not present

## 2019-02-17 DIAGNOSIS — J441 Chronic obstructive pulmonary disease with (acute) exacerbation: Secondary | ICD-10-CM | POA: Diagnosis not present

## 2019-02-17 DIAGNOSIS — Z87891 Personal history of nicotine dependence: Secondary | ICD-10-CM

## 2019-02-17 DIAGNOSIS — I110 Hypertensive heart disease with heart failure: Secondary | ICD-10-CM | POA: Diagnosis present

## 2019-02-17 LAB — COMPREHENSIVE METABOLIC PANEL
ALT: 24 U/L (ref 0–44)
AST: 29 U/L (ref 15–41)
Albumin: 3.3 g/dL — ABNORMAL LOW (ref 3.5–5.0)
Alkaline Phosphatase: 111 U/L (ref 38–126)
Anion gap: 12 (ref 5–15)
BUN: 12 mg/dL (ref 8–23)
CO2: 26 mmol/L (ref 22–32)
Calcium: 8.8 mg/dL — ABNORMAL LOW (ref 8.9–10.3)
Chloride: 90 mmol/L — ABNORMAL LOW (ref 98–111)
Creatinine, Ser: 0.75 mg/dL (ref 0.61–1.24)
GFR calc Af Amer: >60 mL/min (ref >60)
GFR calc non Af Amer: >60 mL/min (ref >60)
Glucose, Bld: 120 mg/dL — ABNORMAL HIGH (ref 70–99)
Potassium: 4 mmol/L (ref 3.5–5.1)
Sodium: 128 mmol/L — ABNORMAL LOW (ref 135–145)
Total Bilirubin: 0.6 mg/dL (ref 0.3–1.2)
Total Protein: 5.8 g/dL — ABNORMAL LOW (ref 6.5–8.1)

## 2019-02-17 LAB — CBC
HCT: 26.9 % — ABNORMAL LOW (ref 39.0–52.0)
Hemoglobin: 8.7 g/dL — ABNORMAL LOW (ref 13.0–17.0)
MCH: 27.4 pg (ref 26.0–34.0)
MCHC: 32.3 g/dL (ref 30.0–36.0)
MCV: 84.9 fL (ref 80.0–100.0)
Platelets: 261 10*3/uL (ref 150–400)
RBC: 3.17 MIL/uL — ABNORMAL LOW (ref 4.22–5.81)
RDW: 16.3 % — ABNORMAL HIGH (ref 11.5–15.5)
WBC: 7.4 10*3/uL (ref 4.0–10.5)
nRBC: 0 % (ref 0.0–0.2)

## 2019-02-17 LAB — DIGOXIN LEVEL: Digoxin Level: 0.8 ng/mL (ref 0.8–2.0)

## 2019-02-17 LAB — GLUCOSE, CAPILLARY: Glucose-Capillary: 218 mg/dL — ABNORMAL HIGH (ref 70–99)

## 2019-02-17 LAB — POC OCCULT BLOOD, ED: Fecal Occult Bld: POSITIVE — AB

## 2019-02-17 LAB — SARS CORONAVIRUS 2 (TAT 6-24 HRS): SARS Coronavirus 2: NEGATIVE

## 2019-02-17 LAB — TROPONIN I (HIGH SENSITIVITY): Troponin I (High Sensitivity): 25 ng/L — ABNORMAL HIGH (ref <18)

## 2019-02-17 LAB — BRAIN NATRIURETIC PEPTIDE: B Natriuretic Peptide: 299.4 pg/mL — ABNORMAL HIGH (ref 0.0–100.0)

## 2019-02-17 MED ORDER — SENNOSIDES-DOCUSATE SODIUM 8.6-50 MG PO TABS
1.0000 | ORAL_TABLET | Freq: Every day | ORAL | Status: DC
  Administered 2019-02-18 – 2019-02-20 (×3): 1 via ORAL
  Filled 2019-02-17 (×5): qty 1

## 2019-02-17 MED ORDER — SODIUM CHLORIDE 0.9 % IV SOLN: 250.0000 mL | INTRAVENOUS | Status: DC | PRN

## 2019-02-17 MED ORDER — ADULT MULTIVITAMIN W/MINERALS CH
1.0000 | ORAL_TABLET | Freq: Every day | ORAL | Status: DC
  Administered 2019-02-18 – 2019-02-22 (×5): 1 via ORAL
  Filled 2019-02-17 (×5): qty 1

## 2019-02-17 MED ORDER — HYDROCODONE-ACETAMINOPHEN 5-325 MG PO TABS
1.0000 | ORAL_TABLET | ORAL | Status: DC | PRN
  Administered 2019-02-19 (×3): 1 via ORAL
  Administered 2019-02-20: 2 via ORAL
  Administered 2019-02-20 (×2): 1 via ORAL
  Administered 2019-02-21 (×3): 2 via ORAL
  Filled 2019-02-17 (×3): qty 2
  Filled 2019-02-17 (×2): qty 1
  Filled 2019-02-17 (×2): qty 2
  Filled 2019-02-17 (×3): qty 1
  Filled 2019-02-17: qty 2

## 2019-02-17 MED ORDER — HYDROCODONE-ACETAMINOPHEN 5-325 MG PO TABS: 1.0000 | ORAL_TABLET | ORAL | Status: DC | PRN

## 2019-02-17 MED ORDER — ATORVASTATIN CALCIUM 80 MG PO TABS
80.0000 mg | ORAL_TABLET | Freq: Every day | ORAL | Status: DC
  Administered 2019-02-17 – 2019-02-21 (×5): 80 mg via ORAL
  Filled 2019-02-17 (×5): qty 1

## 2019-02-17 MED ORDER — POTASSIUM CHLORIDE CRYS ER 20 MEQ PO TBCR
30.0000 meq | EXTENDED_RELEASE_TABLET | Freq: Two times a day (BID) | ORAL | Status: DC
  Administered 2019-02-17 – 2019-02-19 (×5): 30 meq via ORAL
  Filled 2019-02-17 (×5): qty 1

## 2019-02-17 MED ORDER — ALBUTEROL SULFATE (2.5 MG/3ML) 0.083% IN NEBU: 2.5000 mg | INHALATION_SOLUTION | RESPIRATORY_TRACT | Status: DC | PRN

## 2019-02-17 MED ORDER — SODIUM CHLORIDE 0.9% FLUSH
3.0000 mL | Freq: Two times a day (BID) | INTRAVENOUS | Status: DC
  Administered 2019-02-17 – 2019-02-21 (×8): 3 mL via INTRAVENOUS

## 2019-02-17 MED ORDER — ALLOPURINOL 100 MG PO TABS: 100.0000 mg | ORAL_TABLET | Freq: Every day | ORAL | Status: DC

## 2019-02-17 MED ORDER — METOPROLOL TARTRATE 12.5 MG HALF TABLET
12.5000 mg | ORAL_TABLET | Freq: Two times a day (BID) | ORAL | Status: DC
  Administered 2019-02-17 – 2019-02-21 (×8): 12.5 mg via ORAL
  Filled 2019-02-17 (×8): qty 1

## 2019-02-17 MED ORDER — DIGOXIN 125 MCG PO TABS
0.1250 mg | ORAL_TABLET | Freq: Every day | ORAL | Status: DC
  Administered 2019-02-18 – 2019-02-22 (×6): 0.125 mg via ORAL
  Filled 2019-02-17 (×6): qty 1

## 2019-02-17 MED ORDER — POLYETHYLENE GLYCOL 3350 17 G PO PACK
17.0000 g | PACK | Freq: Two times a day (BID) | ORAL | Status: DC
  Administered 2019-02-17 – 2019-02-21 (×4): 17 g via ORAL
  Filled 2019-02-17 (×10): qty 1

## 2019-02-17 MED ORDER — LEVALBUTEROL HCL 0.63 MG/3ML IN NEBU
0.6300 mg | INHALATION_SOLUTION | Freq: Three times a day (TID) | RESPIRATORY_TRACT | Status: DC
  Administered 2019-02-17 – 2019-02-22 (×13): 0.63 mg via RESPIRATORY_TRACT
  Filled 2019-02-17 (×14): qty 3

## 2019-02-17 MED ORDER — INSULIN ASPART 100 UNIT/ML ~~LOC~~ SOLN
0.0000 [IU] | Freq: Every day | SUBCUTANEOUS | Status: DC
  Administered 2019-02-17: 2 [IU] via SUBCUTANEOUS

## 2019-02-17 MED ORDER — TAMSULOSIN HCL 0.4 MG PO CAPS
0.4000 mg | ORAL_CAPSULE | Freq: Every day | ORAL | Status: DC
  Administered 2019-02-17 – 2019-02-21 (×5): 0.4 mg via ORAL
  Filled 2019-02-17 (×5): qty 1

## 2019-02-17 MED ORDER — INSULIN ASPART 100 UNIT/ML ~~LOC~~ SOLN
0.0000 [IU] | Freq: Three times a day (TID) | SUBCUTANEOUS | Status: DC
  Administered 2019-02-18: 3 [IU] via SUBCUTANEOUS
  Administered 2019-02-18 – 2019-02-20 (×6): 1 [IU] via SUBCUTANEOUS
  Administered 2019-02-21 (×2): 2 [IU] via SUBCUTANEOUS
  Administered 2019-02-22: 1 [IU] via SUBCUTANEOUS

## 2019-02-17 MED ORDER — MOMETASONE FURO-FORMOTEROL FUM 100-5 MCG/ACT IN AERO
2.0000 | INHALATION_SPRAY | Freq: Two times a day (BID) | RESPIRATORY_TRACT | Status: DC
  Administered 2019-02-17 – 2019-02-22 (×10): 2 via RESPIRATORY_TRACT
  Filled 2019-02-17: qty 8.8

## 2019-02-17 MED ORDER — UMECLIDINIUM BROMIDE 62.5 MCG/INH IN AEPB
1.0000 | INHALATION_SPRAY | Freq: Every day | RESPIRATORY_TRACT | Status: DC
  Administered 2019-02-18 – 2019-02-22 (×5): 1 via RESPIRATORY_TRACT
  Filled 2019-02-17: qty 7

## 2019-02-17 MED ORDER — ACETAMINOPHEN 650 MG RE SUPP: 650.0000 mg | Freq: Four times a day (QID) | RECTAL | Status: DC | PRN

## 2019-02-17 MED ORDER — ACETAMINOPHEN 325 MG PO TABS: 650.0000 mg | ORAL_TABLET | Freq: Four times a day (QID) | ORAL | Status: DC | PRN

## 2019-02-17 MED ORDER — BRIMONIDINE TARTRATE 0.2 % OP SOLN
1.0000 [drp] | Freq: Two times a day (BID) | OPHTHALMIC | Status: DC
  Administered 2019-02-17 – 2019-02-22 (×10): 1 [drp] via OPHTHALMIC
  Filled 2019-02-17: qty 5

## 2019-02-17 MED ORDER — FERROUS SULFATE 325 (65 FE) MG PO TABS
325.0000 mg | ORAL_TABLET | Freq: Every day | ORAL | Status: DC
  Administered 2019-02-18 – 2019-02-22 (×6): 325 mg via ORAL
  Filled 2019-02-17 (×6): qty 1

## 2019-02-17 MED ORDER — SODIUM CHLORIDE 0.9% FLUSH
3.0000 mL | INTRAVENOUS | Status: DC | PRN
  Administered 2019-02-20: 3 mL via INTRAVENOUS
  Filled 2019-02-17: qty 3

## 2019-02-17 MED ORDER — SPIRONOLACTONE 25 MG PO TABS
25.0000 mg | ORAL_TABLET | Freq: Every day | ORAL | Status: DC
  Administered 2019-02-18: 25 mg via ORAL
  Filled 2019-02-17 (×2): qty 1

## 2019-02-17 MED ORDER — FUROSEMIDE 10 MG/ML IJ SOLN
60.0000 mg | Freq: Two times a day (BID) | INTRAMUSCULAR | Status: DC
  Administered 2019-02-17 – 2019-02-18 (×2): 60 mg via INTRAVENOUS
  Filled 2019-02-17 (×2): qty 6

## 2019-02-17 MED ORDER — PANTOPRAZOLE SODIUM 40 MG IV SOLR
40.0000 mg | Freq: Two times a day (BID) | INTRAVENOUS | Status: DC
  Administered 2019-02-17: 40 mg via INTRAVENOUS
  Filled 2019-02-17 (×2): qty 40

## 2019-02-17 NOTE — Telephone Encounter (Signed)
Scheduled an appt with Rosaria Ferries, PA @ 3:00pm today.

## 2019-02-17 NOTE — Telephone Encounter (Signed)
Pt c/o swelling: STAT is pt has developed SOB within 24 hours  1) How much weight have you gained and in what time span? 5 pounds in 4 days  2) If swelling, where is the swelling located? Ankles 2+  3) Are you currently taking a fluid pill? 80 mg  4) Are you currently SOB? no  5) Do you have a log of your daily weights (if so, list)?   6) Have you gained 3 pounds in a day or 5 pounds in a week? 5 pounds in 4 days  7) Have you traveled recently? no   Pt c/o BP issue: STAT if pt c/o blurred vision, one-sided weakness or slurred speech  1. What are your last 5 BP readings?  114/54 09/15 AM 113/53 09/15 AM  143/45 09/14 PM 103/42 09/14 AM  131/53 09/13 AM 117/37 09/13 AM 84/30 09/12 AM 99/41 09/12 PM   2. Are you having any other symptoms (ex. Dizziness, headache, blurred vision, passed out)? no  3. What is your BP issue? Inconsistent BP readings. Dr. Romeo Apple started the pt on Mitogen 5 mg 3x daily to increase the pt's BP. The Pt still has BP readings inconsistently   Home Health Nurse called to report weight gain, inconsistent BP and , decreased urine output for the patient. The pt usually produces 1200 ml of urine both morning and night, but has only been producing 400 ml of urine for the past couple days. HHN reported the Dr. Romeo Apple, and Dr. Samson Frederic recommended the [facility] contact Dr. Stanford Breed. The HHN is in the home now with the wife and the pt. HHN also was with the home PT earlier.

## 2019-02-17 NOTE — ED Notes (Signed)
ED TO INPATIENT HANDOFF REPORT  ED Nurse Name and Phone #: [facility] [phone]   S Name/Age/Gender [name] 85 y.o. male Room/Bed: 030C/030C  Code Status   Code Status: Prior  Home/SNF/Other Home Patient oriented to: self, place, time and situation Is this baseline? Yes   Triage Complete: Triage complete  Chief Complaint gen weakness  Triage Note Pt BIB [facility] from home. Pt complaining of decreased urine output, pain at catheter site, generalized weakness, and weight gain. Pt does take lasix. VSS. Pt on 3 L [place] at home.   Allergies Allergies  Allergen Reactions  . Bactrim [Sulfamethoxazole-Trimethoprim] Other (See Comments)    "Allergic," per MAR  . Levaquin [Levofloxacin In D5w] Diarrhea and Other (See Comments)    "Allergic," per MAR    Level of Care/Admitting Diagnosis ED Disposition    ED Disposition Condition [place]: [place] [100100]  Level of Care: Telemetry Cardiac [103]  Covid Evaluation: Asymptomatic Screening Protocol (No Symptoms)  Diagnosis: Congestive heart failure (CHF) [facility]) [[id number]]  Admitting Physician: [place] Marshal.Browner  Attending Physician: Laren Everts, ALI Marshal.Browner  Estimated length of stay: past midnight tomorrow  Certification:: I certify this patient will need inpatient services for at least 2 midnights  PT Class (Do Not Modify): Inpatient [101]  PT Acc Code (Do Not Modify): Private [1]       B Medical/Surgery History Past Medical History:  Diagnosis Date  . Acute myocardial infarction, unspecified site, initial episode of care   . Anemia   . Anxiety   . Aortic stenosis    . Atrial fibrillation 06/27/2013  . Atrial fibrillation ([place])    a. permanent, on Coumadin for anticoagulation  . BPPV (benign paroxysmal positional vertigo) 2015  . CAD (coronary artery disease)    a. s/p CABG in 1994 b. cath in 2015 showing normal LM, 100% LCx and RCA stenosis with 50-60% prox LAD stenosis and  100% mid-LAD stenosis; patent sequential SVG-OM2-OM3 and patent LIMA-LAD with PTCA of distal LAD via LIMA graft performed at that time  . Carotid stenosis   . CAROTID STENOSIS- moderate 09/09/2008   Qualifier: Diagnosis of  By: Burnett Kanaris    . Cellulitis 11/15/2018  . Chest pain with moderate risk of acute coronary syndrome 06/27/2013  . CHF (congestive heart failure) ([place])   . Chronic anticoagulation 06/27/2013  . COPD (chronic obstructive pulmonary disease) ([place])   . Depression   . Diabetes mellitus without complication ([place])    FASTING 98-120S  . GERD (gastroesophageal reflux disease)   . Gout attack- on steroid dose pack 06/27/2013  . History of blood transfusion   . History of peptic ulcer disease   . Hyperlipidemia   . Hypertension   . Left atrial severely dilated 01/19/2019  . Myocardial infarction ([place])    X2  . NSTEMI (non-ST elevated myocardial infarction) ([place]) 08/2013  . 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 PEPTIC ULCER DISEASE, HX OF 09/09/2008   Qualifier: Diagnosis of  By: Burnett Kanaris    . PERIPHERAL VASCULAR DISEASE 09/09/2008   Qualifier: Diagnosis of  By: Burnett Kanaris    . Peripheral vascular disease ([place])   . Pneumonia    HX OF  . Presence of indwelling Foley catheter 01/20/2019  . Pressure injury of skin 12/17/2018  . Renal artery stenosis ([place])   . S/P thoracentesis   . Severe aortic stenosis 09/17/2017  . Sleep apnea  OXYGEN AT NIGHT 2L [place]  . Stress fracture of calcaneus 2018   Dr Paulla Dolly [facility])  . Tachycardia- beta blocker increased 06/26/2013  . UNSPECIFIED ANEMIA 09/09/2008   Qualifier: Diagnosis of  By: Burnett Kanaris    . UNSPECIFIED CEREBROVASCULAR DISEASE 09/09/2008   Qualifier: Diagnosis of  By: Burnett Kanaris    . Unstable angina ([place]) 08/31/2013  . Vertigo 06/27/2013   Past Surgical History:  Procedure Laterality Date  . AORTIC ARCH ANGIOGRAPHY N/A 08/14/2017    Procedure: AORTIC ARCH ANGIOGRAPHY;  Surgeon: Sherren Mocha, MD;  Location: [place] CV LAB;  Service: Cardiovascular;  Laterality: N/A;  . BOWEL RESECTION  07/2013   Bowel perforation  . CARDIAC CATHETERIZATION    . CAROTID ENDARTERECTOMY Right 2014   Curt Jews, MD  . COLON SURGERY    . CORONARY ARTERY BYPASS GRAFT  1994  . ENDARTERECTOMY Right 01/05/2013   Procedure: ENDARTERECTOMY CAROTID;  Surgeon: Rosetta Posner, MD;  Location: [place];  Service: Vascular;  Laterality: Right;  . INCISION AND DRAINAGE Left 11/16/2018   Procedure: INCISION AND DRAINAGE LEFT FOREARM/ARM;  Surgeon: Leanora Cover, MD;  Location: [place];  Service: Orthopedics;  Laterality: Left;  . KNEE SURGERY  08/2003   left knee/ARTHROSCOPIC  . LEFT HEART CATHETERIZATION WITH CORONARY/GRAFT ANGIOGRAM N/A 09/01/2013   Procedure: LEFT HEART CATHETERIZATION WITH Beatrix Fetters;  Surgeon: Sinclair Grooms, MD;  Location: [facility] CATH LAB;  Service: Cardiovascular;  Laterality: N/A;  . PATCH ANGIOPLASTY Right 01/05/2013   Procedure: PATCH ANGIOPLASTY;  Surgeon: Rosetta Posner, MD;  Location: [place];  Service: Vascular;  Laterality: Right;  . PERCUTANEOUS CORONARY STENT INTERVENTION (PCI-S)  09/01/2013   Procedure: PERCUTANEOUS CORONARY STENT INTERVENTION (PCI-S);  Surgeon: Sinclair Grooms, MD;  Location: [facility] CATH LAB;  Service: Cardiovascular;;  . removal of bleeding ulcer  1994  . RENAL ARTERY STENT     stenting of the left renal artery as well as a cutting balloon angioplasty for treatment of in-stent restenosis coronary artery bypass grafting in 1994.  [name] RIGHT/LEFT HEART CATH AND CORONARY/GRAFT ANGIOGRAPHY N/A 08/14/2017   Procedure: RIGHT/LEFT HEART CATH AND CORONARY/GRAFT ANGIOGRAPHY;  Surgeon: Sherren Mocha, MD;  Location: [place] CV LAB;  Service: Cardiovascular;  Laterality: N/A;  . TEE WITHOUT CARDIOVERSION N/A 09/17/2017   Procedure: TRANSESOPHAGEAL ECHOCARDIOGRAM (TEE);  Surgeon: Sherren Mocha, MD;  Location: [place];  Service: Open Heart Surgery;  Laterality: N/A;     A IV Location/Drains/Wounds Patient Lines/Drains/Airways Status   Active Line/Drains/Airways    Name:   Placement date:   Placement time:   Site:   Days:   Peripheral IV 02/17/19 Left Forearm   02/17/19    1332    Forearm   less than 1   Urethral Catheter MGSisonRN Non-latex 18 Fr.   01/27/19    1550    Non-latex   21   Wound / Incision (Open or Dehisced) 11/19/18 Other (Comment) Arm Left left dorsal cellulitic wound post I and D   11/19/18    -    Arm   90          Intake/Output Last 24 hours  Intake/Output Summary (Last 24 hours) at 02/17/2019 1758 Last data filed at 02/17/2019 1257 Gross per 24 hour  Intake -  Output 700 ml  Net -700 ml    Labs/Imaging Results for orders placed or performed during the hospital encounter of 02/17/19 (from the past 48 hour(s))  CBC  Status: Abnormal   Collection Time: 02/17/19  1:33 PM  Result Value Ref Range   WBC 7.4 4.0 - 10.5 K/uL   RBC 3.17 (L) 4.22 - 5.81 MIL/uL   Hemoglobin 8.7 (L) 13.0 - 17.0 g/dL   HCT 26.9 (L) 39.0 - 52.0 %   MCV 84.9 80.0 - 100.0 fL   MCH 27.4 26.0 - 34.0 pg   MCHC 32.3 30.0 - 36.0 g/dL   RDW 16.3 (H) 11.5 - 15.5 %   Platelets 261 150 - 400 K/uL   nRBC 0.0 0.0 - 0.2 %    Comment: Performed at [facility] [address], [place], [place] [address]  Comprehensive metabolic panel     Status: Abnormal   Collection Time: 02/17/19  1:33 PM  Result Value Ref Range   Sodium 128 (L) 135 - 145 mmol/L   Potassium 4.0 3.5 - 5.1 mmol/L   Chloride 90 (L) 98 - 111 mmol/L   CO2 26 22 - 32 mmol/L   Glucose, Bld 120 (H) 70 - 99 mg/dL   BUN 12 8 - 23 mg/dL   Creatinine, Ser 0.75 0.61 - 1.24 mg/dL   Calcium 8.8 (L) 8.9 - 10.3 mg/dL   Total Protein 5.8 (L) 6.5 - 8.1 g/dL   Albumin 3.3 (L) 3.5 - 5.0 g/dL   AST 29 15 - 41 U/L   ALT 24 0 - 44 U/L   Alkaline Phosphatase 111 38 - 126 U/L   Total Bilirubin 0.6 0.3 - 1.2 mg/dL   GFR calc non Af Amer >60 >60  mL/min   GFR calc Af Amer >60 >60 mL/min   Anion gap 12 5 - 15    Comment: Performed at [facility], [place] [address] [place], [place] [address]  Brain natriuretic peptide     Status: Abnormal   Collection Time: 02/17/19  1:33 PM  Result Value Ref Range   B Natriuretic Peptide 299.4 (H) 0.0 - 100.0 pg/mL    Comment: Performed at [facility] [address] [place], [place] [address]  Digoxin level     Status: None   Collection Time: 02/17/19  1:33 PM  Result Value Ref Range   Digoxin Level 0.8 0.8 - 2.0 ng/mL    Comment: Performed at [facility] [address] [place], [place] [address]  Troponin I (High Sensitivity)     Status: Abnormal   Collection Time: 02/17/19  1:33 PM  Result Value Ref Range   Troponin I (High Sensitivity) 25 (H) <18 ng/L    Comment: (NOTE) Elevated high sensitivity troponin I (hsTnI) values and significant  changes across serial measurements may suggest ACS but many other  chronic and acute conditions are known to elevate hsTnI results.  Refer to the "Links" section for chest pain algorithms and additional  guidance. Performed at [facility], [place] [address] [place], [place] [address]   POC occult blood, ED Provider will collect     Status: Abnormal   Collection Time: 02/17/19  3:17 PM  Result Value Ref Range   Fecal Occult Bld POSITIVE (A) NEGATIVE   Dg Chest Port 1 View  Result Date: 02/17/2019 CLINICAL DATA:  85 year old male with shortness of breath. EXAM: PORTABLE CHEST 1 VIEW COMPARISON:  Chest radiograph dated 01/22/2019 FINDINGS: There is stable cardiomegaly. No vascular congestion or edema. Small bilateral pleural effusions similar or slightly decreased since the prior radiograph. There bibasilar hazy densities which may represent atelectasis  or infiltrate. No pneumothorax. Median sternotomy wires, CABG vascular clips, and aortic valve repair. Atherosclerotic calcification of the aorta. No acute osseous pathology.  IMPRESSION: 1. Small bilateral pleural effusions seen bibasilar atelectasis versus infiltrate. 2. Cardiomegaly. Electronically Signed   By: Anner Crete M.D.   On: 02/17/2019 13:32    Pending Labs Unresulted Labs (From admission, onward)    Start     Ordered   02/17/19 1557  SARS CORONAVIRUS 2 (TAT 6-24 HRS) Nasopharyngeal Nasopharyngeal Swab  (Asymptomatic/Tier 2 Patients Labs)  Once,   STAT    Question Answer Comment  Is this test for diagnosis or screening Screening   Symptomatic for COVID-19 as defined by CDC No   Hospitalized for COVID-19 No   Admitted to ICU for COVID-19 No   Previously tested for COVID-19 Yes   Resident in a congregate (group) care setting Yes   Employed in healthcare setting No      02/17/19 1556   Signed and Held  CBC  Tomorrow morning,   R     Signed and Held   Signed and Held  Basic metabolic panel  Tomorrow morning,   R     Signed and Held          Vitals/Pain Today's Vitals   02/17/19 1600 02/17/19 1630 02/17/19 1700 02/17/19 1730  BP: (!) 130/47 (!) 131/50 (!) 133/47 (!) 125/51  Pulse: (!) 51 (!) 59 67 65  Resp: 19 (!) 27 (!) 23 18  Temp:      TempSrc:      SpO2: 100% 100% 100% 100%  Weight:      Height:      PainSc:        Isolation Precautions No active isolations  Medications Medications - No data to display  Mobility walks with device Moderate fall risk   Focused Assessments Cardiac Assessment Handoff:  Cardiac Rhythm: Atrial fibrillation Lab Results  Component Value Date   TROPONINI 0.07 ([place]) 11/22/2018   No results found for: DDIMER Does the Patient currently have chest pain? No     R Recommendations: See Admitting Provider Note  Report given to:   Additional Notes: pt c/o pain to buttocks and right heel with c/o "sores " ast both

## 2019-02-17 NOTE — Telephone Encounter (Signed)
Patient currently admitted. Nothing further needed at this time. If patients needs hospital f/u will make an appt at d/c.

## 2019-02-17 NOTE — Discharge Planning (Signed)
EDCM received calls from [place] concerning care.  Pt is active with [facility] and [facility] for RN, PT and OT services.  Kieron Kantner J. Clydene Laming, [place], [place], [place]

## 2019-02-17 NOTE — ED Provider Notes (Addendum)
[facility] DEPARTMENT Provider Note   CSN: [id number] Arrival date & time: 02/17/19  1226     History   Chief Complaint Chief Complaint  Patient presents with  . Weakness  . Weight Gain    HPI [name] is a 85 y.o. male.     Patient with hx chronic afib, chf, copd, presents with 3-4 lb wt increase, increased sob, increased bilateral leg edema, and decrease urine output. Symptoms gradual onset in past couple days, moderate, persistent, worse today. States compliant w home meds. States all AM had only 300 cc urine output, but that in ED has had another 400 cc urine output. Denies cough or uri symptoms. No known covid + exposure. No fever or chills. No chest pain or discomfort. No pnd. At baseline, has foley cath, uses home o2 3 liters [place] continuously.   The history is provided by the patient and the EMS personnel.  Weakness Associated symptoms: diarrhea and shortness of breath   Associated symptoms: no abdominal pain, no chest pain, no cough, no fever, no headaches and no vomiting     Past Medical History:  Diagnosis Date  . Acute myocardial infarction, unspecified site, initial episode of care   . Anemia   . Anxiety   . Aortic stenosis    . Atrial fibrillation 06/27/2013  . Atrial fibrillation ([place])    a. permanent, on Coumadin for anticoagulation  . BPPV (benign paroxysmal positional vertigo) 2015  . CAD (coronary artery disease)    a. s/p CABG in 1994 b. cath in 2015 showing normal LM, 100% LCx and RCA stenosis with 50-60% prox LAD stenosis and 100% mid-LAD stenosis; patent sequential SVG-OM2-OM3 and patent LIMA-LAD with PTCA of distal LAD via LIMA graft performed at that time  . Carotid stenosis   . CAROTID STENOSIS- moderate 09/09/2008   Qualifier: Diagnosis of  By: Burnett Kanaris    . Cellulitis 11/15/2018  . Chest pain with moderate risk of acute coronary syndrome 06/27/2013  . CHF (congestive heart failure) ([place])   . Chronic  anticoagulation 06/27/2013  . COPD (chronic obstructive pulmonary disease) ([place])   . Depression   . Diabetes mellitus without complication ([place])    FASTING 98-120S  . GERD (gastroesophageal reflux disease)   . Gout attack- on steroid dose pack 06/27/2013  . History of blood transfusion   . History of peptic ulcer disease   . Hyperlipidemia   . Hypertension   . Left atrial severely dilated 01/19/2019  . Myocardial infarction ([place])    X2  . NSTEMI (non-ST elevated myocardial infarction) ([place]) 08/2013  . 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 PEPTIC ULCER DISEASE, HX OF 09/09/2008   Qualifier: Diagnosis of  By: Burnett Kanaris    . PERIPHERAL VASCULAR DISEASE 09/09/2008   Qualifier: Diagnosis of  By: Burnett Kanaris    . Peripheral vascular disease ([place])   . Pneumonia    HX OF  . Presence of indwelling Foley catheter 01/20/2019  . Pressure injury of skin 12/17/2018  . Renal artery stenosis ([place])   . S/P thoracentesis   . Severe aortic stenosis 09/17/2017  . Sleep apnea    OXYGEN AT NIGHT 2L [place]  . Stress fracture of calcaneus 2018   Dr Paulla Dolly [facility])  . Tachycardia- beta blocker increased 06/26/2013  . UNSPECIFIED ANEMIA 09/09/2008   Qualifier: Diagnosis of  By: Burnett Kanaris    . UNSPECIFIED CEREBROVASCULAR DISEASE 09/09/2008  Qualifier: Diagnosis of  By: Burnett Kanaris    . Unstable angina ([place]) 08/31/2013  . Vertigo 06/27/2013    Patient Active Problem List   Diagnosis Date Noted  . Hypoxemia   . Acute pulmonary edema (HCC)   . Heart failure with preserved ejection fraction ([place]) 01/20/2019  . Acute on chronic diastolic heart failure ([place]) 01/20/2019  . Pyuria 01/20/2019  . Hematuria, microscopic 01/20/2019  . Presence of indwelling Foley catheter 01/20/2019  . Goals of care, counseling/discussion   . Palliative care encounter   . Severe Pulmonary hypertension ([place]) 01/19/2019  . Chronic hypoxemic respiratory  failure ([place]) 01/19/2019  . COPD (chronic obstructive pulmonary disease) ([place]) 01/19/2019  . Hypoalbuminemia 01/19/2019  . Pleural effusion 01/19/2019  . Left atrial severely dilated 01/19/2019  . Dyspnea 12/31/2018  . Pulmonary embolism and infarction ([place])   . Acute on chronic respiratory failure with hypoxia ([place])   . 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 Mitral stenosis   . Atrial fibrillation ([place])   . Chronic anticoagulation 06/27/2013  . 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 Essential hypertension, benign 03/10/2009  . HYPERLIPIDEMIA 09/09/2008  . RENAL ARTERY STENOSIS- moderate 09/09/2008    Past Surgical History:  Procedure Laterality Date  . AORTIC ARCH ANGIOGRAPHY N/A 08/14/2017   Procedure: AORTIC ARCH ANGIOGRAPHY;  Surgeon: Sherren Mocha, MD;  Location: [facility];  Service: Cardiovascular;  Laterality: N/A;  . BOWEL RESECTION  07/2013   Bowel perforation  . CARDIAC CATHETERIZATION    . CAROTID ENDARTERECTOMY Right 2014   Curt Jews, MD  . COLON SURGERY    . CORONARY ARTERY BYPASS GRAFT  1994  . ENDARTERECTOMY Right 01/05/2013   Procedure: ENDARTERECTOMY CAROTID;  Surgeon: Rosetta Posner, MD;  Location: [facility];  Service: Vascular;  Laterality: Right;  . INCISION AND DRAINAGE Left 11/16/2018   Procedure: INCISION AND DRAINAGE LEFT FOREARM/ARM;  Surgeon: Leanora Cover, MD;  Location: [facility];  Service: Orthopedics;  Laterality: Left;  . KNEE SURGERY  08/2003   left knee/ARTHROSCOPIC  . LEFT HEART CATHETERIZATION WITH CORONARY/GRAFT ANGIOGRAM N/A 09/01/2013   Procedure: LEFT HEART CATHETERIZATION WITH Beatrix Fetters;  Surgeon: Sinclair Grooms, MD;  Location: [facility] C[facility] LAB;  Service: Cardiovascular;  Laterality: N/A;  . PATCH ANGIOPLASTY Right 01/05/2013   Procedure: PATCH ANGIOPLASTY;  Surgeon: Rosetta Posner, MD;  Location: [facility];  Service: Vascular;  Laterality: Right;   . PERCUTANEOUS CORONARY STENT INTERVENTION (PCI-S)  09/01/2013   Procedure: PERCUTANEOUS CORONARY STENT INTERVENTION (PCI-S);  Surgeon: Sinclair Grooms, MD;  Location: [facility] CATH LAB;  Service: Cardiovascular;;  . removal of bleeding ulcer  1994  . RENAL ARTERY STENT     stenting of the left renal artery as well as a cutting balloon angioplasty for treatment of in-stent restenosis coronary artery bypass grafting in 1994.  [name] RIGHT/LEFT HEART CATH AND CORONARY/GRAFT ANGIOGRAPHY N/A 08/14/2017   Procedure: RIGHT/LEFT HEART CATH AND CORONARY/GRAFT ANGIOGRAPHY;  Surgeon: Sherren Mocha, MD;  Location: [facility] LAB;  Service: Cardiovascular;  Laterality: N/A;  . TEE WITHOUT CARDIOVERSION N/A 09/17/2017   Procedure: TRANSESOPHAGEAL ECHOCARDIOGRAM (TEE);  Surgeon: Sherren Mocha, MD;  Location: [employer];  Service: Open Heart Surgery;  Laterality: N/A;        Home Medications    Prior to Admission medications   Medication Sig Start Date End Date Taking? Authorizing Provider  allopurinol (ZYLOPRIM) 300 MG tablet Take 300 mg  by mouth daily. 10/12/18   [provider]  Amino Acids-Protein Hydrolys (PRO-STAT) LIQD Take 30 mLs by mouth 2 (two) times a day.    [provider]  apixaban (ELIQUIS) 5 MG TABS tablet Take 1 tablet (5 mg total) by mouth 2 (two) times daily. 12/22/18 01/21/19  Lurline Del, DO  apixaban (ELIQUIS) 5 MG TABS tablet Take 5 mg by mouth 2 (two) times daily.    [provider]  atorvastatin (LIPITOR) 80 MG tablet Take 1 tablet (80 mg total) by mouth at bedtime. 12/07/14   Lelon Perla, MD  brimonidine (ALPHAGAN) 0.2 % ophthalmic solution Place 1 drop into both eyes 2 (two) times daily.    [provider]  budesonide-formoterol (SYMBICORT) 80-4.5 MCG/ACT inhaler Inhale 1 puff into the lungs daily as needed ("for shortness of breath or wheezing- rinse mouth afterwards").  09/05/18   [provider]  digoxin (LANOXIN) 0.125 MG tablet Take 1  tablet (0.125 mg total) by mouth daily. 01/29/19   Shirley, [place], DO  ferrous sulfate 325 (65 FE) MG tablet Take 325 mg by mouth daily with breakfast.     [provider]  furosemide (LASIX) 80 MG tablet Take 1 tablet (80 mg total) by mouth daily. Patient taking differently: Take 80 mg by mouth 2 (two) times daily.  01/06/19   Wilber Oliphant, MD  Gauze Pads & Dressings (FOAM DRESSING) PADS Apply 1 patch topically See admin instructions. Apply a foam dressing to sacrum every day AND as needed for redness    [provider]  HYDROcodone-acetaminophen (NORCO/VICODIN) 5-325 MG tablet Take 1-2 tablets by mouth every 4 (four) hours as needed for moderate pain or severe pain. 01/06/19   Lattie Haw, MD  insulin aspart (NOVOLOG FLEXPEN) 100 UNIT/ML FlexPen Inject 2 Units into the skin See admin instructions. Inject 2 units into the skin at 9 AM in the morning for a BGL >200    [provider]  lansoprazole (PREVACID) 30 MG capsule Take 30 mg by mouth daily before breakfast.     [provider]  levalbuterol (XOPENEX) 0.63 MG/3ML nebulizer solution Take 3 mLs (0.63 mg total) by nebulization every 6 (six) hours as needed for wheezing or shortness of breath. Patient taking differently: Take 0.63 mg by nebulization 3 (three) times daily.  12/22/18   Lurline Del, DO  Melatonin 5 MG TABS Take 5 mg by mouth at bedtime.    [provider]  metFORMIN (GLUCOPHAGE-XR) 500 MG 24 hr tablet Take 500 mg by mouth daily. 12/10/18   [provider]  metoprolol tartrate (LOPRESSOR) 25 MG tablet Take 0.5 tablets (12.5 mg total) by mouth 2 (two) times daily. 01/28/19   Shirley, [place], DO  Multiple Vitamin (MULTIVITAMIN WITH MINERALS) TABS Take 1 tablet by mouth daily.    [provider]  NON FORMULARY Take 120 mLs by mouth See admin instructions. MedPass: Drink 120 ml's by mouth two times a day    [provider]  OXYGEN Inhale 3 L/min into the lungs continuous.  TO MAINTAIN SATS <90%    [provider]  polyethylene glycol (MIRALAX / GLYCOLAX) 17 g packet Take 17 g by mouth 2 (two) times daily. Patient not taking: Reported on 02/11/2019 12/22/18   Lurline Del, DO  potassium chloride (KLOR-CON M15) 15 MEQ tablet Take 2 tablets (30 mEq total) by mouth 2 (two) times daily. Patient not taking: Reported on 02/11/2019 01/28/19   Shirley, [place], DO  sennosides-docusate sodium (SENOKOT-S) 8.6-50  MG tablet Take 1 tablet by mouth daily.    [provider]  spironolactone (ALDACTONE) 25 MG tablet Take 25 mg by mouth daily.    [provider]  tamsulosin (FLOMAX) 0.4 MG CAPS capsule Take 1 capsule (0.4 mg total) by mouth daily after supper. Patient taking differently: Take 0.4 mg by mouth every evening.  12/22/18   Lurline Del, DO  trolamine salicylate (ASPERCREME) 10 % cream Apply 1 application topically every 6 (six) hours as needed for muscle pain.     [provider]  umeclidinium bromide (INCRUSE ELLIPTA) 62.5 MCG/INH AEPB Inhale 1 puff into the lungs daily. 11/27/18   Matilde Haymaker, MD    Family History Family History  Problem Relation Age of Onset  . Coronary artery disease Mother   . Heart disease Mother        Before age 5  . Hyperlipidemia Mother   . Hypertension Mother   . Heart attack Mother   . Coronary artery disease Father   . Heart disease Father        After age 43  . Heart attack Father   . Heart disease Sister        After age 22  . Heart disease Brother        After age 54  . Coronary artery disease Other        13 sibling, almost all have coronary disease and some with premature onset  . Heart disease Other        13 sibling, almost all have coronary disease and some with premature onset    Social History Social History   Tobacco Use  . Smoking status: Former Smoker    Quit date: 04/27/1993    Years since quitting: 25.8  . Smokeless tobacco: Never Used  Substance Use Topics  . Alcohol  use: No    Alcohol/week: 0.0 standard drinks  . Drug use: No     Allergies   Bactrim [sulfamethoxazole-trimethoprim] and Levaquin [levofloxacin in d5w]   Review of Systems Review of Systems  Constitutional: Negative for chills and fever.  HENT: Negative for sore throat.   Eyes: Negative for redness.  Respiratory: Positive for shortness of breath. Negative for cough.   Cardiovascular: Positive for leg swelling. Negative for chest pain.  Gastrointestinal: Positive for diarrhea. Negative for abdominal pain and vomiting.       4-5 loose stools in past day.   Endocrine: Negative for polyuria.  Genitourinary: Negative for flank pain.  Musculoskeletal: Negative for back pain and neck pain.  Skin: Negative for rash.  Neurological: Positive for weakness. Negative for headaches.  Hematological: Does not bruise/bleed easily.  Psychiatric/Behavioral: Negative for confusion.     Physical Exam Updated Vital Signs BP 124/75 (BP Location: Right Arm)   Pulse 67   Temp (!) 97.4 F (36.3 C) (Oral)   Resp (!) 22   Ht 1.829 m (6')   Wt 78.8 kg   SpO2 100%   BMI 23.57 kg/m   Physical Exam Vitals signs and nursing note reviewed.  Constitutional:      Appearance: Normal appearance. He is well-developed.  HENT:     Head: Atraumatic.     Nose: Nose normal.     Mouth/Throat:     Mouth: Mucous membranes are moist.     Pharynx: Oropharynx is clear.  Eyes:     General: No scleral icterus.    Conjunctiva/sclera: Conjunctivae normal.  Neck:     Musculoskeletal: Normal range of  motion and neck supple. No neck rigidity.     Trachea: No tracheal deviation.  Cardiovascular:     Rate and Rhythm: Normal rate and regular rhythm.     Pulses: Normal pulses.     Heart sounds: Normal heart sounds. No murmur. No friction rub. No gallop.   Pulmonary:     Effort: Pulmonary effort is normal. No accessory muscle usage or respiratory distress.     Breath sounds: Normal breath sounds.  Abdominal:      General: Bowel sounds are normal. There is no distension.     Palpations: Abdomen is soft.     Tenderness: There is no abdominal tenderness. There is no guarding.  Genitourinary:    Comments: No cva tenderness. Dark brown stool sent for hemoccult Musculoskeletal:     Right lower leg: Edema present.     Left lower leg: Edema present.     Comments: Symmetric bil lower leg edema, mild.   Skin:    General: Skin is warm and dry.     Findings: No rash.  Neurological:     Mental Status: He is alert.     Comments: Alert, speech clear.   Psychiatric:        Mood and Affect: Mood normal.      ED Treatments / Results  Labs (all labs ordered are listed, but only abnormal results are displayed) Results for orders placed or performed during the hospital encounter of 02/17/19  CBC  Result Value Ref Range   WBC 7.4 4.0 - 10.5 K/uL   RBC 3.17 (L) 4.22 - 5.81 MIL/uL   Hemoglobin 8.7 (L) 13.0 - 17.0 g/dL   HCT 26.9 (L) 39.0 - 52.0 %   MCV 84.9 80.0 - 100.0 fL   MCH 27.4 26.0 - 34.0 pg   MCHC 32.3 30.0 - 36.0 g/dL   RDW 16.3 (H) 11.5 - 15.5 %   Platelets 261 150 - 400 K/uL   nRBC 0.0 0.0 - 0.2 %  Comprehensive metabolic panel  Result Value Ref Range   Sodium 128 (L) 135 - 145 mmol/L   Potassium 4.0 3.5 - 5.1 mmol/L   Chloride 90 (L) 98 - 111 mmol/L   CO2 26 22 - 32 mmol/L   Glucose, Bld 120 (H) 70 - 99 mg/dL   BUN 12 8 - 23 mg/dL   Creatinine, Ser 0.75 0.61 - 1.24 mg/dL   Calcium 8.8 (L) 8.9 - 10.3 mg/dL   Total Protein 5.8 (L) 6.5 - 8.1 g/dL   Albumin 3.3 (L) 3.5 - 5.0 g/dL   AST 29 15 - 41 U/L   ALT 24 0 - 44 U/L   Alkaline Phosphatase 111 38 - 126 U/L   Total Bilirubin 0.6 0.3 - 1.2 mg/dL   GFR calc non Af Amer >60 >60 mL/min   GFR calc Af Amer >60 >60 mL/min   Anion gap 12 5 - 15  Brain natriuretic peptide  Result Value Ref Range   B Natriuretic Peptide 299.4 (H) 0.0 - 100.0 pg/mL  Digoxin level  Result Value Ref Range   Digoxin Level 0.8 0.8 - 2.0 ng/mL  POC occult  blood, ED Provider will collect  Result Value Ref Range   Fecal Occult Bld POSITIVE (A) NEGATIVE  Troponin I (High Sensitivity)  Result Value Ref Range   Troponin I (High Sensitivity) 25 (H) <18 ng/L   Dg Chest Port 1 View  Result Date: 02/17/2019 CLINICAL DATA:  85 year old male with shortness of breath. EXAM:  PORTABLE CHEST 1 VIEW COMPARISON:  Chest radiograph dated 01/22/2019 FINDINGS: There is stable cardiomegaly. No vascular congestion or edema. Small bilateral pleural effusions similar or slightly decreased since the prior radiograph. There bibasilar hazy densities which may represent atelectasis or infiltrate. No pneumothorax. Median sternotomy wires, CABG vascular clips, and aortic valve repair. Atherosclerotic calcification of the aorta. No acute osseous pathology. IMPRESSION: 1. Small bilateral pleural effusions seen bibasilar atelectasis versus infiltrate. 2. Cardiomegaly. Electronically Signed   By: Anner Crete M.D.   On: 02/17/2019 13:32   Dg Chest Port 1 View  Result Date: 01/22/2019 CLINICAL DATA:  Respiratory failure EXAM: PORTABLE CHEST 1 VIEW COMPARISON:  01/21/2019 FINDINGS: Prior median sternotomy and valve replacement and CABG. Cardiomegaly with vascular congestion. Bibasilar atelectasis or infiltrates with small effusions. No real change since prior study. IMPRESSION: Cardiomegaly, vascular congestion. Small bilateral effusions with bibasilar atelectasis or infiltrates. No change. Electronically Signed   By: Rolm Baptise M.D.   On: 01/22/2019 11:36   Dg Chest Port 1 View  Result Date: 01/21/2019 CLINICAL DATA:  Shortness of breath, respiratory failure, atrial fibrillation, coronary artery disease post MI, COPD, CHF, diabetes mellitus, hypertension EXAM: PORTABLE CHEST 1 VIEW COMPARISON:  Portable exam 0657 hours compared to 01/20/2019 FINDINGS: Enlargement of cardiac silhouette post CABG and TAVR. Atherosclerotic calcification aorta. Pulmonary vascular congestion. Mild  interstitial edema with bibasilar pleural effusions and basilar atelectasis, not significantly changed. No pneumothorax. Bones demineralized. IMPRESSION: Persistent CHF with bibasilar effusions and atelectasis, unchanged. Electronically Signed   By: Lavonia Dana M.D.   On: 01/21/2019 08:22   Dg Chest Port 1 View  Result Date: 01/20/2019 CLINICAL DATA:  Follow-up pleural effusion EXAM: PORTABLE CHEST 1 VIEW COMPARISON:  01/19/2019 FINDINGS: Cardiac shadow remains enlarged. Changes of prior surgery and prior TAVR are again noted and stable. The lungs are well aerated with persistent left basilar consolidation. Small pleural effusions are again seen and stable. The vascular congestion has improved somewhat in the interval. Mild interstitial edema remains. No bony abnormality is noted. IMPRESSION: Mild edema and bilateral pleural effusions. The remainder of the exam is stable. Electronically Signed   By: Inez Catalina M.D.   On: 01/20/2019 07:26   Dg Chest Port 1 View  Result Date: 01/19/2019 CLINICAL DATA:  85 year old with shortness of breath EXAM: PORTABLE CHEST 1 VIEW COMPARISON:  01/02/2019 and prior radiographs FINDINGS: Cardiomegaly, pulmonary vascular congestion and mild interstitial opacities noted. Small pleural effusions are present. LEFT LOWER lung atelectasis/airspace disease identified. There is no evidence of pneumothorax. IMPRESSION: Cardiomegaly with pulmonary vascular congestion, small bilateral pleural effusions and possible mild interstitial edema. LEFT LOWER lung atelectasis/airspace disease. Electronically Signed   By: Margarette Canada M.D.   On: 01/19/2019 14:58    EKG EKG Interpretation  Date/Time:  Tuesday February 17 2019 13:07:03 EDT Ventricular Rate:  73 PR Interval:    QRS Duration: 165 QT Interval:  445 QTC Calculation: 426 R Axis:   -16 Text Interpretation:  Atrial fibrillation Left bundle branch block Premature ventricular complexes Non-specific ST-t changes Confirmed by  Lajean Saver [phone]) on 02/17/2019 1:18:50 PM   Radiology Dg Chest Port 1 View  Result Date: 02/17/2019 CLINICAL DATA:  85 year old male with shortness of breath. EXAM: PORTABLE CHEST 1 VIEW COMPARISON:  Chest radiograph dated 01/22/2019 FINDINGS: There is stable cardiomegaly. No vascular congestion or edema. Small bilateral pleural effusions similar or slightly decreased since the prior radiograph. There bibasilar hazy densities which may represent atelectasis or infiltrate. No pneumothorax. Median sternotomy wires, CABG  vascular clips, and aortic valve repair. Atherosclerotic calcification of the aorta. No acute osseous pathology. IMPRESSION: 1. Small bilateral pleural effusions seen bibasilar atelectasis versus infiltrate. 2. Cardiomegaly. Electronically Signed   By: Anner Crete M.D.   On: 02/17/2019 13:32    Procedures Procedures (including critical care time)  Medications Ordered in ED Medications - No data to display   Initial Impression / Assessment and Plan / ED Course  I have reviewed the triage vital signs and the nursing notes.  Pertinent labs & imaging results that were available during my care of the patient were reviewed by me and considered in my medical decision making (see chart for details).  Iv ns. Continuous pulse ox and monitor. o2 3 liters [place]. Stat labs, ecg. Pcxr.   Reviewed nursing notes and prior charts for additional history.   Labs reviewed by me - hct 26, previously 32, stool is heme pos. Na is lower than previous. bnp elevated.   CXR reviewed by me - small bil effusions.   Given wt increase, sob, edema, and worsening anemia with heme pos stools, hyponatremia, will admit to medicine service.  Unassigned medicine consulted for admission.   Discussed w hospitalist - they will admit, request gi consult. Gi consult called.     Final Clinical Impressions(s) / ED Diagnoses   Final diagnoses:  None    ED Discharge Orders    None       Lajean Saver, MD 02/17/19 [place]    Lajean Saver, MD 02/17/19 1544

## 2019-02-17 NOTE — Telephone Encounter (Signed)
-----   Message from Erick Colace, NP sent at 01/14/2019  3:33 PM EDT ----- Regarding: pleural effusion Hello Ladies.   Hope all is well!  I just saw the message from [facility] forwarded by Dr Tamala Julian. I see this guy has an appointment with Tammy on 20th.   -let's do this: Tammy can you get a xray on the 20th when he sees you?  Ive got him being scheduled to see me on 21st. [facility]. If he has no sig effusion then we can cancel But if he does I can tap him on the 21st the day after he sees you.   I have to get him Covid tested before he can set foot in the hospital. The soonest would be the 21st but makes most sense to have him see you first as he's already scheduled.   Sorry for the delay emily. I'm not great about reading my staff messages. Will get better (:  Thanks  Laurey Arrow

## 2019-02-17 NOTE — Patient Outreach (Signed)
Made aware of member's ED admit by [facility] Post Acute MD Director.   [name] recently dc'd from [facility] on 02/07/19. He went home with wife and home health thru [facility]. Referral was made to [facility] (home based outpatient palliative care program administered by [facility]) upon SNF dc as well. [facility] Care Management team became active after most recent SNF stay.    Member could benefit from hospice services. However, both he and his wife have been resistant to hospice in the recent past. Hopefully hospice can be revisited if admitted to hospital.   Telephone call made to [facility] with [facility] at [phone]. [name] confirms that [name] just enrolled in [facility] program on Monday, 02/16/19.  Telephone call made to [facility] ED RNCM to make aware of above notes. Notification sent to [facility] team as well.   Marthenia Rolling, MSN-Ed, RN,BSN [facility] Acute Care Coordinator [phone] [facility]) [phone]  (Toll free office)

## 2019-02-17 NOTE — ED Notes (Signed)
Pt states he has had 1000cc output since 0700 this am.

## 2019-02-17 NOTE — ED Triage Notes (Signed)
Pt BIB [facility] from home. Pt complaining of decreased urine output, pain at catheter site, generalized weakness, and weight gain. Pt does take lasix. VSS. Pt on 3 L [place] at home.

## 2019-02-17 NOTE — Telephone Encounter (Signed)
Spoke to pt's wife about the weight gain, swelling, decreased urine output and inconsistent BP's. Advised the pt's wife that he should be seen. Rosaria Ferries, PA has an appt at 3:00pm. Wife said that she does not know if she can get him out of the house. Advised pt's wife that if he is not able to make it to the car for appt to call 911. Will route to [facility], PA and get further advise.

## 2019-02-17 NOTE — H&P (Signed)
[facility]                                                                                    Patient Demographics  [name], is a 85 y.o. male  CSN: [id number]  MRN: [id number]  DOB - 05-16-1932  Admit Date - 02/17/2019  Outpatient Primary MD for the patient is Nicoletta Dress, MD   With History of -  Past Medical History:  Diagnosis Date  . Acute myocardial infarction, unspecified site, initial episode of care   . Anemia   . Anxiety   . Aortic stenosis    . Atrial fibrillation 06/27/2013  . Atrial fibrillation ([place])    a. permanent, on Coumadin for anticoagulation  . BPPV (benign paroxysmal positional vertigo) 2015  . CAD (coronary artery disease)    a. s/p CABG in 1994 b. cath in 2015 showing normal LM, 100% LCx and RCA stenosis with 50-60% prox LAD stenosis and 100% mid-LAD stenosis; patent sequential SVG-OM2-OM3 and patent LIMA-LAD with PTCA of distal LAD via LIMA graft performed at that time  . Carotid stenosis   . CAROTID STENOSIS- moderate 09/09/2008   Qualifier: Diagnosis of  By: Burnett Kanaris    . Cellulitis 11/15/2018  . Chest pain with moderate risk of acute coronary syndrome 06/27/2013  . CHF (congestive heart failure) ([place])   . Chronic anticoagulation 06/27/2013  . COPD (chronic obstructive pulmonary disease) ([place])   . Depression   . Diabetes mellitus without complication ([place])    FASTING 98-120S  . GERD (gastroesophageal reflux disease)   . Gout attack- on steroid dose pack 06/27/2013  . History of blood transfusion   . History of peptic ulcer disease   . Hyperlipidemia   . Hypertension   . Left atrial severely dilated 01/19/2019  . Myocardial infarction ([place])    X2  . NSTEMI (non-ST elevated myocardial infarction) ([place]) 08/2013  . 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 PEPTIC ULCER DISEASE, HX OF 09/09/2008   Qualifier: Diagnosis of  By: Burnett Kanaris    .  PERIPHERAL VASCULAR DISEASE 09/09/2008   Qualifier: Diagnosis of  By: Burnett Kanaris    . Peripheral vascular disease ([place])   . Pneumonia    HX OF  . Presence of indwelling Foley catheter 01/20/2019  . Pressure injury of skin 12/17/2018  . Renal artery stenosis ([place])   . S/P thoracentesis   . Severe aortic stenosis 09/17/2017  . Sleep apnea    OXYGEN AT NIGHT 2L [place]  . Stress fracture of calcaneus 2018   Dr Paulla Dolly [facility])  . Tachycardia- beta blocker increased 06/26/2013  . UNSPECIFIED ANEMIA 09/09/2008   Qualifier: Diagnosis of  By: Burnett Kanaris    . UNSPECIFIED CEREBROVASCULAR DISEASE 09/09/2008   Qualifier: Diagnosis of  By: Burnett Kanaris    . Unstable angina ([place]) 08/31/2013  . Vertigo 06/27/2013      Past Surgical History:  Procedure Laterality Date  . AORTIC ARCH ANGIOGRAPHY N/A 08/14/2017   Procedure: AORTIC ARCH ANGIOGRAPHY;  Surgeon: Sherren Mocha, MD;  Location: [place] CV [facility];  Service: Cardiovascular;  Laterality: N/A;  .  BOWEL RESECTION  07/2013   Bowel perforation  . CARDIAC CATHETERIZATION    . CAROTID ENDARTERECTOMY Right 2014   Curt Jews, MD  . COLON SURGERY    . CORONARY ARTERY BYPASS GRAFT  1994  . ENDARTERECTOMY Right 01/05/2013   Procedure: ENDARTERECTOMY CAROTID;  Surgeon: Rosetta Posner, MD;  Location: [place];  Service: Vascular;  Laterality: Right;  . INCISION AND DRAINAGE Left 11/16/2018   Procedure: INCISION AND DRAINAGE LEFT FOREARM/ARM;  Surgeon: Leanora Cover, MD;  Location: [place];  Service: Orthopedics;  Laterality: Left;  . KNEE SURGERY  08/2003   left knee/ARTHROSCOPIC  . LEFT HEART CATHETERIZATION WITH CORONARY/GRAFT ANGIOGRAM N/A 09/01/2013   Procedure: LEFT HEART CATHETERIZATION WITH Beatrix Fetters;  Surgeon: Sinclair Grooms, MD;  Location: [facility] CATH LAB;  Service: Cardiovascular;  Laterality: N/A;  . PATCH ANGIOPLASTY Right 01/05/2013   Procedure: PATCH ANGIOPLASTY;  Surgeon: Rosetta Posner, MD;  Location: [place];  Service: Vascular;   Laterality: Right;  . PERCUTANEOUS CORONARY STENT INTERVENTION (PCI-S)  09/01/2013   Procedure: PERCUTANEOUS CORONARY STENT INTERVENTION (PCI-S);  Surgeon: Sinclair Grooms, MD;  Location: [facility] CATH LAB;  Service: Cardiovascular;;  . removal of bleeding ulcer  1994  . RENAL ARTERY STENT     stenting of the left renal artery as well as a cutting balloon angioplasty for treatment of in-stent restenosis coronary artery bypass grafting in 1994.  [name] RIGHT/LEFT HEART CATH AND CORONARY/GRAFT ANGIOGRAPHY N/A 08/14/2017   Procedure: RIGHT/LEFT HEART CATH AND CORONARY/GRAFT ANGIOGRAPHY;  Surgeon: Sherren Mocha, MD;  Location: [place] [facility];  Service: Cardiovascular;  Laterality: N/A;  . TEE WITHOUT CARDIOVERSION N/A 09/17/2017   Procedure: TRANSESOPHAGEAL ECHOCARDIOGRAM (TEE);  Surgeon: Sherren Mocha, MD;  Location: [place];  Service: Open Heart Surgery;  Laterality: N/A;    in for   Chief Complaint  Patient presents with  . Weakness  . Weight Gain     HPI  [name]  is a 85 y.o. male, with past medical history significant for CAD, CHF with normal systolic function on 6/38, COPD on 3 L nasal cannula and urinary retention with chronic indwelling Foley catheter presenting with 2 days history of decreased urinary output, lower extremity edema.  No history of chest pains, fever, chills, abdominal pain or diarrhea.  However in the emergency room the patient was able to give 750 cc of urine. In the emergency room, patient was noted to have a minor drop in his hemoglobin to 8.7 with guaiac positive stools.  Patient has a history of A. fib and is on Eliquis.  The patient also was noted to be hyponatremic with mild lower extremity edema, more than usual according to patient.  EKG showed A. fib with controlled rate.  Chest x-ray showed small bilateral pleural effusions with bibasilar atelectasis and cardiomegaly.    Review of Systems    In addition to the HPI above,  No Fever-chills, No Headache,  No changes with Vision or hearing, No problems swallowing food or Liquids, No Abdominal pain, No Nausea or Vommitting, Bowel movements are regular, No Blood in stool or Urine, No dysuria, No new skin rashes or bruises, No new joints pains-aches,  No new weakness, tingling, numbness in any extremity, No recent weight gain or loss, No polyuria, polydypsia or polyphagia, No significant Mental Stressors.  All other systems were reviewed and were negative.   Social History Social History   Tobacco Use  . Smoking status: Former Audiological scientist  date: 04/27/1993    Years since quitting: 25.8  . Smokeless tobacco: Never Used  Substance Use Topics  . Alcohol use: No    Alcohol/week: 0.0 standard drinks     Family History Family History  Problem Relation Age of Onset  . Coronary artery disease Mother   . Heart disease Mother        Before age 56  . Hyperlipidemia Mother   . Hypertension Mother   . Heart attack Mother   . Coronary artery disease Father   . Heart disease Father        After age 24  . Heart attack Father   . Heart disease Sister        After age 68  . Heart disease Brother        After age 28  . Coronary artery disease Other        13 sibling, almost all have coronary disease and some with premature onset  . Heart disease Other        13 sibling, almost all have coronary disease and some with premature onset     Prior to Admission medications   Medication Sig Start Date End Date Taking? Authorizing Provider  apixaban (ELIQUIS) 5 MG TABS tablet Take 1 tablet (5 mg total) by mouth 2 (two) times daily. 12/22/18 02/17/19 Yes Welborn, Ryan, DO  digoxin (LANOXIN) 0.125 MG tablet Take 1 tablet (0.125 mg total) by mouth daily. 01/29/19  Yes Enid Derry, [place], DO  furosemide (LASIX) 80 MG tablet Take 1 tablet (80 mg total) by mouth daily. Patient taking differently: Take 80 mg by mouth 2 (two) times daily.  01/06/19  Yes Wilber Oliphant, MD  lansoprazole (PREVACID) 30 MG  capsule Take 60 mg by mouth daily at 12 noon.    Yes [provider]  metFORMIN (GLUCOPHAGE-XR) 500 MG 24 hr tablet Take 500 mg by mouth at bedtime.  12/10/18  Yes [provider]  metoprolol tartrate (LOPRESSOR) 25 MG tablet Take 0.5 tablets (12.5 mg total) by mouth 2 (two) times daily. 01/28/19  Yes Enid Derry, [place], DO  midodrine (PROAMATINE) 5 MG tablet Take 5 mg by mouth 3 (three) times daily. 02/13/19  Yes [provider]  spironolactone (ALDACTONE) 25 MG tablet Take 25 mg by mouth daily.   Yes [provider]  tamsulosin (FLOMAX) 0.4 MG CAPS capsule Take 1 capsule (0.4 mg total) by mouth daily after supper. Patient taking differently: Take 0.4 mg by mouth every evening.  12/22/18  Yes Welborn, Ryan, DO  allopurinol (ZYLOPRIM) 300 MG tablet Take 300 mg by mouth daily. 10/12/18   [provider]  Amino Acids-Protein Hydrolys (PRO-STAT) LIQD Take 30 mLs by mouth 2 (two) times a day.    [provider]  apixaban (ELIQUIS) 5 MG TABS tablet Take 5 mg by mouth 2 (two) times daily.    [provider]  atorvastatin (LIPITOR) 80 MG tablet Take 1 tablet (80 mg total) by mouth at bedtime. 12/07/14   Lelon Perla, MD  brimonidine (ALPHAGAN) 0.2 % ophthalmic solution Place 1 drop into both eyes 2 (two) times daily.    [provider]  budesonide-formoterol (SYMBICORT) 80-4.5 MCG/ACT inhaler Inhale 1 puff into the lungs daily as needed ("for shortness of breath or wheezing- rinse mouth afterwards").  09/05/18   [provider]  ferrous sulfate 325 (65 FE) MG tablet Take 325 mg by mouth daily with breakfast.     [provider]  Gauze Pads &  Dressings (FOAM DRESSING) PADS Apply 1 patch topically See admin instructions. Apply a foam dressing to sacrum every day AND as needed for redness    [provider]  HYDROcodone-acetaminophen (NORCO/VICODIN) 5-325 MG tablet Take 1-2 tablets by mouth every 4 (four) hours as needed  for moderate pain or severe pain. 01/06/19   Lattie Haw, MD  insulin aspart (NOVOLOG FLEXPEN) 100 UNIT/ML FlexPen Inject 2 Units into the skin See admin instructions. Inject 2 units into the skin at 9 AM in the morning for a BGL >200    [provider]  levalbuterol (XOPENEX) 0.63 MG/3ML nebulizer solution Take 3 mLs (0.63 mg total) by nebulization every 6 (six) hours as needed for wheezing or shortness of breath. Patient taking differently: Take 0.63 mg by nebulization 3 (three) times daily.  12/22/18   Lurline Del, DO  Melatonin 5 MG TABS Take 5 mg by mouth at bedtime.    [provider]  Multiple Vitamin (MULTIVITAMIN WITH MINERALS) TABS Take 1 tablet by mouth daily.    [provider]  NON FORMULARY Take 120 mLs by mouth See admin instructions. MedPass: Drink 120 ml's by mouth two times a day    [provider]  OXYGEN Inhale 3 L/min into the lungs continuous. TO MAINTAIN SATS <90%    [provider]  polyethylene glycol (MIRALAX / GLYCOLAX) 17 g packet Take 17 g by mouth 2 (two) times daily. Patient not taking: Reported on 02/11/2019 12/22/18   Lurline Del, DO  potassium chloride (KLOR-CON M15) 15 MEQ tablet Take 2 tablets (30 mEq total) by mouth 2 (two) times daily. Patient not taking: Reported on 02/11/2019 01/28/19   Shirley, [place], DO  sennosides-docusate sodium (SENOKOT-S) 8.6-50 MG tablet Take 1 tablet by mouth daily.    [provider]  trolamine salicylate (ASPERCREME) 10 % cream Apply 1 application topically every 6 (six) hours as needed for muscle pain.     [provider]  umeclidinium bromide (INCRUSE ELLIPTA) 62.5 MCG/INH AEPB Inhale 1 puff into the lungs daily. 11/27/18   Matilde Haymaker, MD    Allergies  Allergen Reactions  . Bactrim [Sulfamethoxazole-Trimethoprim] Other (See Comments)    "Allergic," per MAR  . Levaquin [Levofloxacin In D5w] Diarrhea and Other (See Comments)    "Allergic," per [facility]    Physical  Exam  Vitals  Blood pressure (!) 130/47, pulse (!) 51, temperature (!) 97.4 F (36.3 C), temperature source Oral, resp. rate 19, height 6' (1.829 m), weight 78.8 kg, SpO2 100 %.   General appearance, elderly male looks chronically ill HEENT no jaundice, mild pallor, no facial deviation oral thrush Neck supple, no neck vein distention Chest bilateral basilar crackles Heart irregularly irregular,  systolic murmur noted Abdomen soft, nontender bowel sounds present Extremities +1 edema Neuro grossly nonfocal, patient moving all extremities Skin no rashes or ulcers  Data Review  CBC Recent Labs  Lab 02/17/19 1333  WBC 7.4  HGB 8.7*  HCT 26.9*  PLT 261  MCV 84.9  MCH 27.4  MCHC 32.3  RDW 16.3*   ------------------------------------------------------------------------------------------------------------------  Chemistries  Recent Labs  Lab 02/17/19 1333  NA 128*  K 4.0  CL 90*  CO2 26  GLUCOSE 120*  BUN 12  CREATININE 0.75  CALCIUM 8.8*  AST 29  ALT 24  ALKPHOS 111  BILITOT 0.6   ------------------------------------------------------------------------------------------------------------------ estimated creatinine clearance is 74.1 mL/min (by C-G formula based on SCr of 0.75 mg/dL). ------------------------------------------------------------------------------------------------------------------ No results for input(s): TSH, T4TOTAL, T3FREE, THYROIDAB in  the last 72 hours.  Invalid input(s): FREET3   Coagulation profile No results for input(s): INR, PROTIME in the last 168 hours. ------------------------------------------------------------------------------------------------------------------- No results for input(s): DDIMER in the last 72 hours. -------------------------------------------------------------------------------------------------------------------  Cardiac Enzymes No results for input(s): CKMB, TROPONINI, MYOGLOBIN in the last 168  hours.  Invalid input(s): CK ------------------------------------------------------------------------------------------------------------------ Invalid input(s): POCBNP   ---------------------------------------------------------------------------------------------------------------  Urinalysis    Component Value Date/Time   COLORURINE AMBER (A) 01/19/2019 1647   APPEARANCEUR CLOUDY (A) 01/19/2019 1647   LABSPEC 1.012 01/19/2019 1647   PHURINE 9.0 (H) 01/19/2019 1647   GLUCOSEU NEGATIVE 01/19/2019 1647   HGBUR MODERATE (A) 01/19/2019 1647   BILIRUBINUR NEGATIVE 01/19/2019 1647   KETONESUR NEGATIVE 01/19/2019 1647   PROTEINUR 100 (A) 01/19/2019 1647   UROBILINOGEN 0.2 12/23/2012 1026   NITRITE POSITIVE (A) 01/19/2019 1647   LEUKOCYTESUR MODERATE (A) 01/19/2019 1647    ----------------------------------------------------------------------------------------------------------------   Imaging results:   Dg Chest Port 1 View  Result Date: 02/17/2019 CLINICAL DATA:  85 year old male with shortness of breath. EXAM: PORTABLE CHEST 1 VIEW COMPARISON:  Chest radiograph dated 01/22/2019 FINDINGS: There is stable cardiomegaly. No vascular congestion or edema. Small bilateral pleural effusions similar or slightly decreased since the prior radiograph. There bibasilar hazy densities which may represent atelectasis or infiltrate. No pneumothorax. Median sternotomy wires, CABG vascular clips, and aortic valve repair. Atherosclerotic calcification of the aorta. No acute osseous pathology. IMPRESSION: 1. Small bilateral pleural effusions seen bibasilar atelectasis versus infiltrate. 2. Cardiomegaly. Electronically Signed   By: Anner Crete M.D.   On: 02/17/2019 13:32   Dg Chest Port 1 View  Result Date: 01/22/2019 CLINICAL DATA:  Respiratory failure EXAM: PORTABLE CHEST 1 VIEW COMPARISON:  01/21/2019 FINDINGS: Prior median sternotomy and valve replacement and CABG. Cardiomegaly with vascular  congestion. Bibasilar atelectasis or infiltrates with small effusions. No real change since prior study. IMPRESSION: Cardiomegaly, vascular congestion. Small bilateral effusions with bibasilar atelectasis or infiltrates. No change. Electronically Signed   By: Rolm Baptise M.D.   On: 01/22/2019 11:36   Dg Chest Port 1 View  Result Date: 01/21/2019 CLINICAL DATA:  Shortness of breath, respiratory failure, atrial fibrillation, coronary artery disease post MI, COPD, CHF, diabetes mellitus, hypertension EXAM: PORTABLE CHEST 1 VIEW COMPARISON:  Portable exam 0657 hours compared to 01/20/2019 FINDINGS: Enlargement of cardiac silhouette post CABG and TAVR. Atherosclerotic calcification aorta. Pulmonary vascular congestion. Mild interstitial edema with bibasilar pleural effusions and basilar atelectasis, not significantly changed. No pneumothorax. Bones demineralized. IMPRESSION: Persistent CHF with bibasilar effusions and atelectasis, unchanged. Electronically Signed   By: Lavonia Dana M.D.   On: 01/21/2019 08:22   Dg Chest Port 1 View  Result Date: 01/20/2019 CLINICAL DATA:  Follow-up pleural effusion EXAM: PORTABLE CHEST 1 VIEW COMPARISON:  01/19/2019 FINDINGS: Cardiac shadow remains enlarged. Changes of prior surgery and prior TAVR are again noted and stable. The lungs are well aerated with persistent left basilar consolidation. Small pleural effusions are again seen and stable. The vascular congestion has improved somewhat in the interval. Mild interstitial edema remains. No bony abnormality is noted. IMPRESSION: Mild edema and bilateral pleural effusions. The remainder of the exam is stable. Electronically Signed   By: Inez Catalina M.D.   On: 01/20/2019 07:26   Dg Chest Port 1 View  Result Date: 01/19/2019 CLINICAL DATA:  85 year old with shortness of breath EXAM: PORTABLE CHEST 1 VIEW COMPARISON:  01/02/2019 and prior radiographs FINDINGS: Cardiomegaly, pulmonary vascular congestion and mild interstitial  opacities noted. Small pleural effusions are present. LEFT  LOWER lung atelectasis/airspace disease identified. There is no evidence of pneumothorax. IMPRESSION: Cardiomegaly with pulmonary vascular congestion, small bilateral pleural effusions and possible mild interstitial edema. LEFT LOWER lung atelectasis/airspace disease. Electronically Signed   By: Margarette Canada M.D.   On: 01/19/2019 14:58    My personal review of EKG: A. fib with left bundle branch block at 73 bpm and PVCs  Assessment & Plan  Fluid overload primary diastolic congestive heart failure versus urinary retention Last echocardiogram 7/20 with normal ejection fraction and unable to interpret diastolic function. Continue with Lasix IV twice daily  Anemia with guaiac positive stools Patient on Eliquis for A. Fib Continue with FeSO4 GI consulted  History of urinary retention with an acute episode Patient has a chronic indwelling catheter Patient was able to give 750 mL of urine today  Hyponatremia  A. fib, chronic On Eliquis Placed on hold today until GI decision  History of CAD status post CABG Asymptomatic at this time  COPD Continue with nebulizer as needed\  Diabetes mellitus type 2 Insulin-dependent ISS  Hyperlipidemia Continue statins    DVT Prophylaxis SCDs  AM Labs Ordered, also please review Full Orders  Family Communication: Shann Medal, his wife and left a message to call me back.  Code Status full  Disposition Plan: Home  Time spent in minutes : 48 minutes  Condition GUARDED   @SIGNATURE @

## 2019-02-17 NOTE — ED Notes (Signed)
Penis without redness swelling or drainage at insertion site of foley.

## 2019-02-18 ENCOUNTER — Other Ambulatory Visit: Payer: Self-pay

## 2019-02-18 ENCOUNTER — Ambulatory Visit: Payer: Self-pay

## 2019-02-18 DIAGNOSIS — J9611 Chronic respiratory failure with hypoxia: Secondary | ICD-10-CM

## 2019-02-18 DIAGNOSIS — R0600 Dyspnea, unspecified: Secondary | ICD-10-CM

## 2019-02-18 DIAGNOSIS — I4821 Permanent atrial fibrillation: Secondary | ICD-10-CM

## 2019-02-18 DIAGNOSIS — I5033 Acute on chronic diastolic (congestive) heart failure: Secondary | ICD-10-CM

## 2019-02-18 LAB — BASIC METABOLIC PANEL
Anion gap: 10 (ref 5–15)
BUN: 10 mg/dL (ref 8–23)
CO2: 28 mmol/L (ref 22–32)
Calcium: 9.2 mg/dL (ref 8.9–10.3)
Chloride: 95 mmol/L — ABNORMAL LOW (ref 98–111)
Creatinine, Ser: 0.76 mg/dL (ref 0.61–1.24)
GFR calc Af Amer: >60 mL/min (ref >60)
GFR calc non Af Amer: >60 mL/min (ref >60)
Glucose, Bld: 122 mg/dL — ABNORMAL HIGH (ref 70–99)
Potassium: 4.4 mmol/L (ref 3.5–5.1)
Sodium: 133 mmol/L — ABNORMAL LOW (ref 135–145)

## 2019-02-18 LAB — CBC
HCT: 26.8 % — ABNORMAL LOW (ref 39.0–52.0)
Hemoglobin: 8.4 g/dL — ABNORMAL LOW (ref 13.0–17.0)
MCH: 26.5 pg (ref 26.0–34.0)
MCHC: 31.3 g/dL (ref 30.0–36.0)
MCV: 84.5 fL (ref 80.0–100.0)
Platelets: 308 10*3/uL (ref 150–400)
RBC: 3.17 MIL/uL — ABNORMAL LOW (ref 4.22–5.81)
RDW: 16.6 % — ABNORMAL HIGH (ref 11.5–15.5)
WBC: 7.3 10*3/uL (ref 4.0–10.5)
nRBC: 0 % (ref 0.0–0.2)

## 2019-02-18 LAB — RETICULOCYTES
Immature Retic Fract: 29.3 % — ABNORMAL HIGH (ref 2.3–15.9)
RBC.: 3.06 MIL/uL — ABNORMAL LOW (ref 4.22–5.81)
Retic Count, Absolute: 76.2 10*3/uL (ref 19.0–186.0)
Retic Ct Pct: 2.5 % (ref 0.4–3.1)

## 2019-02-18 LAB — FOLATE: Folate: 21.5 ng/mL (ref >5.9)

## 2019-02-18 LAB — GLUCOSE, CAPILLARY
Glucose-Capillary: 130 mg/dL — ABNORMAL HIGH (ref 70–99)
Glucose-Capillary: 215 mg/dL — ABNORMAL HIGH (ref 70–99)
Glucose-Capillary: 115 mg/dL — ABNORMAL HIGH (ref 70–99)
Glucose-Capillary: 152 mg/dL — ABNORMAL HIGH (ref 70–99)

## 2019-02-18 LAB — IRON AND TIBC
Iron: 95 ug/dL (ref 45–182)
Saturation Ratios: 29 % (ref 17.9–39.5)
TIBC: 328 ug/dL (ref 250–450)
UIBC: 233 ug/dL

## 2019-02-18 LAB — VITAMIN B12: Vitamin B-12: 1210 pg/mL — ABNORMAL HIGH (ref 180–914)

## 2019-02-18 LAB — FERRITIN: Ferritin: 65 ng/mL (ref 24–336)

## 2019-02-18 MED ORDER — FUROSEMIDE 10 MG/ML IJ SOLN
60.0000 mg | Freq: Two times a day (BID) | INTRAMUSCULAR | Status: AC
  Administered 2019-02-18 – 2019-02-19 (×3): 60 mg via INTRAVENOUS
  Filled 2019-02-18 (×3): qty 6

## 2019-02-18 MED ORDER — MELATONIN 3 MG PO TABS
3.0000 mg | ORAL_TABLET | Freq: Every evening | ORAL | Status: DC | PRN
  Administered 2019-02-19 – 2019-02-20 (×3): 3 mg via ORAL
  Filled 2019-02-18 (×4): qty 1

## 2019-02-18 MED ORDER — PANTOPRAZOLE SODIUM 40 MG IV SOLR
40.0000 mg | Freq: Every day | INTRAVENOUS | Status: DC
  Administered 2019-02-18: 40 mg via INTRAVENOUS
  Filled 2019-02-18: qty 40

## 2019-02-18 MED ORDER — ORAL CARE MOUTH RINSE
15.0000 mL | Freq: Two times a day (BID) | OROMUCOSAL | Status: DC
  Administered 2019-02-18 – 2019-02-22 (×9): 15 mL via OROMUCOSAL

## 2019-02-18 MED ORDER — APIXABAN 5 MG PO TABS
5.0000 mg | ORAL_TABLET | Freq: Two times a day (BID) | ORAL | Status: DC
  Administered 2019-02-18 – 2019-02-22 (×9): 5 mg via ORAL
  Filled 2019-02-18 (×9): qty 1

## 2019-02-18 MED ORDER — FUROSEMIDE 10 MG/ML IJ SOLN: 40.0000 mg | Freq: Two times a day (BID) | INTRAMUSCULAR | Status: DC

## 2019-02-18 NOTE — Plan of Care (Signed)

## 2019-02-18 NOTE — Consult Note (Signed)
[facility] Consult  Referring Provider: Merton Border, MD Primary Care Physician:  Nicoletta Dress, MD Primary Gastroenterologist: Althia Forts  Reason for Consultation: Anemia, FOBT positive stool  HPI: [name] is a 85 y.o. male with history of chronic atrial fibrillation on Eliquis, CHF, COPD, on oxygen via nasal cannula at home presented to the ER with complaints of generalized weakness, increased shortness of breath, swelling of bilateral lower extremities and decreased urine output.  Patient has an indwelling Foley's catheter and as per his wife the output was only 400 cc in 3 days, his blood pressure was low at home and he was subsequently brought to the ED. GI was consulted because patient has anemia, his hemoglobin in 01/2019 was 10, was 8.7 on presentation and stool was FOBT positive.  History is obtained from the patient as well as his wife over the phone([name] [place] [phone]).  Patient normally has regular bowel movements with no evidence of blood in stool or black stools. Yesterday however he had 5 episodes of loose stool and after multiple wiping, his wife noticed small amount of bright red blood which she believes is related to local trauma in the anorectal skin area.  Patient has not had a bowel movement today. Patient's evening dose of Eliquis was held. Patient denies nausea, vomiting, acid reflux, difficulty swallowing or pain on swallowing. 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 Patient has history of peptic ulcer with bleeding in the past.    Past Medical History:  Diagnosis Date  . Acute myocardial infarction, unspecified site, initial episode of care   . Anemia   . Anxiety   . Aortic stenosis    . Atrial fibrillation 06/27/2013  . Atrial fibrillation ([place])    a. permanent, on Coumadin for  anticoagulation  . BPPV (benign paroxysmal positional vertigo) 2015  . CAD (coronary artery disease)    a. s/p CABG in 1994 b. cath in 2015 showing normal LM, 100% LCx and RCA stenosis with 50-60% prox LAD stenosis and 100% mid-LAD stenosis; patent sequential SVG-OM2-OM3 and patent LIMA-LAD with PTCA of distal LAD via LIMA graft performed at that time  . Carotid stenosis   . CAROTID STENOSIS- moderate 09/09/2008   Qualifier: Diagnosis of  By: Burnett Kanaris    . Cellulitis 11/15/2018  . Chest pain with moderate risk of acute coronary syndrome 06/27/2013  . CHF (congestive heart failure) ([place])   . Chronic anticoagulation 06/27/2013  . COPD (chronic obstructive pulmonary disease) ([place])   . Depression   . Diabetes mellitus without complication ([place])    FASTING 98-120S  . GERD (gastroesophageal reflux disease)   . Gout attack- on steroid dose pack 06/27/2013  . History of blood transfusion   . History of peptic ulcer disease   . Hyperlipidemia   . Hypertension   . Left atrial severely dilated 01/19/2019  . Myocardial infarction ([place])    X2  . NSTEMI (non-ST elevated myocardial infarction) ([place]) 08/2013  . 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 PEPTIC ULCER DISEASE, HX OF 09/09/2008   Qualifier: Diagnosis of  By: Burnett Kanaris    . PERIPHERAL VASCULAR DISEASE 09/09/2008   Qualifier: Diagnosis of  By: Burnett Kanaris    . Peripheral vascular disease ([place])   . Pneumonia    HX OF  . Presence of indwelling Foley  catheter 01/20/2019  . Pressure injury of skin 12/17/2018  . Renal artery stenosis ([place])   . S/P thoracentesis   . Severe aortic stenosis 09/17/2017  . Sleep apnea    OXYGEN AT NIGHT 2L [place]  . Stress fracture of calcaneus 2018   Dr Paulla Dolly [facility])  . Tachycardia- beta blocker increased 06/26/2013  . UNSPECIFIED ANEMIA 09/09/2008   Qualifier: Diagnosis of  By: Burnett Kanaris    . UNSPECIFIED CEREBROVASCULAR DISEASE  09/09/2008   Qualifier: Diagnosis of  By: Burnett Kanaris    . Unstable angina ([place]) 08/31/2013  . Vertigo 06/27/2013    Past Surgical History:  Procedure Laterality Date  . AORTIC ARCH ANGIOGRAPHY N/A 08/14/2017   Procedure: AORTIC ARCH ANGIOGRAPHY;  Surgeon: Sherren Mocha, MD;  Location: [facility];  Service: Cardiovascular;  Laterality: N/A;  . BOWEL RESECTION  07/2013   Bowel perforation  . CARDIAC CATHETERIZATION    . CAROTID ENDARTERECTOMY Right 2014   Curt Jews, MD  . COLON SURGERY    . CORONARY ARTERY BYPASS GRAFT  1994  . ENDARTERECTOMY Right 01/05/2013   Procedure: ENDARTERECTOMY CAROTID;  Surgeon: Rosetta Posner, MD;  Location: [facility];  Service: Vascular;  Laterality: Right;  . INCISION AND DRAINAGE Left 11/16/2018   Procedure: INCISION AND DRAINAGE LEFT FOREARM/ARM;  Surgeon: Leanora Cover, MD;  Location: [facility];  Service: Orthopedics;  Laterality: Left;  . KNEE SURGERY  08/2003   left knee/ARTHROSCOPIC  . LEFT HEART CATHETERIZATION WITH CORONARY/GRAFT ANGIOGRAM N/A 09/01/2013   Procedure: LEFT HEART CATHETERIZATION WITH Beatrix Fetters;  Surgeon: Sinclair Grooms, MD;  Location[facility] C[facility] LAB;  Service: Cardiovascular;  Laterality: N/A;  . PATCH ANGIOPLASTY Right 01/05/2013   Procedure: PATCH ANGIOPLASTY;  Surgeon: Rosetta Posner, MD;  Location: [place];  Service: Vascular;  Laterality: Right;  . PERCUTANEOUS CORONARY STENT INTERVENTION (PCI-S)  09/01/2013   Procedure: PERCUTANEOUS CORONARY STENT INTERVENTION (PCI-S);  Surgeon: Sinclair Grooms, MD;  Location: [facility] CATH LAB;  Service: Cardiovascular;;  . removal of bleeding ulcer  1994  . RENAL ARTERY STENT     stenting of the left renal artery as well as a cutting balloon angioplasty for treatment of in-stent restenosis coronary artery bypass grafting in 1994.  [name] RIGHT/LEFT HEART CATH AND CORONARY/GRAFT ANGIOGRAPHY N/A 08/14/2017   Procedure: RIGHT/LEFT HEART CATH AND CORONARY/GRAFT ANGIOGRAPHY;  Surgeon: Sherren Mocha,  MD;  Location: [facility];  Service: Cardiovascular;  Laterality: N/A;  . TEE WITHOUT CARDIOVERSION N/A 09/17/2017   Procedure: TRANSESOPHAGEAL ECHOCARDIOGRAM (TEE);  Surgeon: Sherren Mocha, MD;  Location: [place];  Service: Open Heart Surgery;  Laterality: N/A;    Prior to Admission medications   Medication Sig Start Date End Date Taking? Authorizing Provider  acyclovir (ZOVIRAX) 800 MG tablet Take 400 mg by mouth 2 (two) times daily.   Yes [provider]  apixaban (ELIQUIS) 5 MG TABS tablet Take 1 tablet (5 mg total) by mouth 2 (two) times daily. 12/22/18 02/17/19 Yes Welborn, Ryan, DO  atorvastatin (LIPITOR) 80 MG tablet Take 1 tablet (80 mg total) by mouth at bedtime. 12/07/14  Yes Lelon Perla, MD  brimonidine (ALPHAGAN) 0.2 % ophthalmic solution Place 1 drop into both eyes 2 (two) times daily.   Yes [provider]  budesonide-formoterol (SYMBICORT) 80-4.5 MCG/ACT inhaler Inhale 1 puff into the lungs daily as needed ("for shortness of breath or wheezing- rinse mouth afterwards").  09/05/18  Yes [provider]  Cholecalciferol (VITAMIN D) 50 MCG (  2000 UT) tablet Take 1 tablet by mouth daily. 02/13/19  Yes [provider]  digoxin (LANOXIN) 0.125 MG tablet Take 1 tablet (0.125 mg total) by mouth daily. 01/29/19  Yes Enid Derry, [place], DO  fluconazole (DIFLUCAN) 150 MG tablet Take 150 mg by mouth daily. 02/13/19  Yes [provider]  furosemide (LASIX) 80 MG tablet Take 1 tablet (80 mg total) by mouth daily. Patient taking differently: Take 80 mg by mouth 2 (two) times daily.  01/06/19  Yes Wilber Oliphant, MD  Gauze Pads & Dressings (FOAM DRESSING) PADS Apply 1 patch topically See admin instructions. Apply a foam dressing to sacrum every day AND as needed for redness   Yes [provider]  insulin aspart (NOVOLOG FLEXPEN) 100 UNIT/ML FlexPen Inject 2 Units into the skin See admin instructions. Inject 2 units into the skin at 9 AM in the  morning for a BGL >200   Yes [provider]  lansoprazole (PREVACID) 30 MG capsule Take 60 mg by mouth daily at 12 noon.    Yes [provider]  levalbuterol (XOPENEX) 0.63 MG/3ML nebulizer solution Take 3 mLs (0.63 mg total) by nebulization every 6 (six) hours as needed for wheezing or shortness of breath. Patient taking differently: Take 0.63 mg by nebulization 3 (three) times daily.  12/22/18  Yes Lurline Del, DO  Melatonin 5 MG TABS Take 5 mg by mouth at bedtime.   Yes [provider]  metFORMIN (GLUCOPHAGE-XR) 500 MG 24 hr tablet Take 500 mg by mouth at bedtime.  12/10/18  Yes [provider]  metoprolol tartrate (LOPRESSOR) 25 MG tablet Take 0.5 tablets (12.5 mg total) by mouth 2 (two) times daily. 01/28/19  Yes Enid Derry, [place], DO  midodrine (PROAMATINE) 5 MG tablet Take 5 mg by mouth 3 (three) times daily. 02/13/19  Yes [provider]  Multiple Vitamin (MULTIVITAMIN WITH MINERALS) TABS Take 1 tablet by mouth daily.   Yes [provider]  NON FORMULARY Take 120 mLs by mouth See admin instructions. MedPass: Drink 120 ml's by mouth two times a day   Yes [provider]  OXYGEN Inhale 3 L/min into the lungs continuous. TO MAINTAIN SATS <90%   Yes [provider]  polyethylene glycol (MIRALAX / GLYCOLAX) 17 g packet Take 17 g by mouth 2 (two) times daily. 12/22/18  Yes Lurline Del, DO  Potassium 99 MG TABS Take 198 mg by mouth 2 (two) times daily.   Yes [provider]  spironolactone (ALDACTONE) 25 MG tablet Take 25 mg by mouth daily.   Yes [provider]  tamsulosin (FLOMAX) 0.4 MG CAPS capsule Take 1 capsule (0.4 mg total) by mouth daily after supper. Patient taking differently: Take 0.4 mg by mouth every evening.  12/22/18  Yes Welborn, Ryan, DO  trolamine salicylate (ASPERCREME) 10 % cream Apply 1 application topically every 6 (six) hours as needed for muscle pain.    Yes [provider]   umeclidinium bromide (INCRUSE ELLIPTA) 62.5 MCG/INH AEPB Inhale 1 puff into the lungs daily. 11/27/18  Yes Matilde Haymaker, MD  HYDROcodone-acetaminophen (NORCO/VICODIN) 5-325 MG tablet Take 1-2 tablets by mouth every 4 (four) hours as needed for moderate pain or severe pain. Patient not taking: Reported on 02/17/2019 01/06/19   Lattie Haw, MD  potassium chloride (KLOR-CON M15) 15 MEQ tablet Take 2 tablets (30 mEq total) by mouth 2 (two) times daily. Patient not taking: Reported on 02/11/2019 01/28/19   Shirley, [place], DO    Current Facility-Administered  Medications  Medication Dose Route Frequency Provider Last Rate Last Dose  . 0.9 %  sodium chloride infusion  250 mL Intravenous PRN Merton Border, MD      . acetaminophen (TYLENOL) tablet 650 mg  650 mg Oral Q6H PRN Merton Border, MD       Or  . acetaminophen (TYLENOL) suppository 650 mg  650 mg Rectal Q6H PRN Merton Border, MD      . albuterol (PROVENTIL) (2.5 MG/3ML) 0.083% nebulizer solution 2.5 mg  2.5 mg Nebulization Q4H PRN Merton Border, MD      . atorvastatin (LIPITOR) tablet 80 mg  80 mg Oral QHS Merton Border, MD   80 mg at 02/17/19 2133  . brimonidine (ALPHAGAN) 0.2 % ophthalmic solution 1 drop  1 drop Both Eyes BID Merton Border, MD   1 drop at 02/17/19 2134  . digoxin (LANOXIN) tablet 0.125 mg  0.125 mg Oral Daily Hijazi, Deatra Canter, MD      . ferrous sulfate tablet 325 mg  325 mg Oral Q breakfast Merton Border, MD   325 mg at 02/18/19 5102  . furosemide (LASIX) injection 60 mg  60 mg Intravenous Q12H Merton Border, MD   60 mg at 02/17/19 2133  . HYDROcodone-acetaminophen (NORCO/VICODIN) 5-325 MG per tablet 1-2 tablet  1-2 tablet Oral Q4H PRN Merton Border, MD      . insulin aspart (novoLOG) injection 0-5 Units  0-5 Units Subcutaneous QHS Merton Border, MD   2 Units at 02/17/19 2244  . insulin aspart (novoLOG) injection 0-9 Units  0-9 Units Subcutaneous TID WC Merton Border, MD   1 Units at 02/18/19 [phone]  . levalbuterol (XOPENEX) nebulizer solution 0.63 mg  0.63  mg Nebulization TID Merton Border, MD   0.63 mg at 02/18/19 0800  . MEDLINE mouth rinse  15 mL Mouth Rinse BID Merton Border, MD      . metoprolol tartrate (LOPRESSOR) tablet 12.5 mg  12.5 mg Oral BID Merton Border, MD   12.5 mg at 02/17/19 2140  . mometasone-formoterol (DULERA) 100-5 MCG/ACT inhaler 2 puff  2 puff Inhalation BID Merton Border, MD   2 puff at 02/18/19 0800  . multivitamin with minerals tablet 1 tablet  1 tablet Oral Daily Merton Border, MD      . pantoprazole (PROTONIX) injection 40 mg  40 mg Intravenous Q12H Merton Border, MD   40 mg at 02/17/19 2132  . polyethylene glycol (MIRALAX / GLYCOLAX) packet 17 g  17 g Oral BID Merton Border, MD   17 g at 02/17/19 2132  . potassium chloride (K-DUR) CR tablet 30 mEq  30 mEq Oral BID Merton Border, MD   30 mEq at 02/17/19 2132  . senna-docusate (Senokot-S) tablet 1 tablet  1 tablet Oral Daily Hijazi, Deatra Canter, MD      . sodium chloride flush (NS) 0.9 % injection 3 mL  3 mL Intravenous Q12H Merton Border, MD   3 mL at 02/17/19 2135  . sodium chloride flush (NS) 0.9 % injection 3 mL  3 mL Intravenous PRN Merton Border, MD      . spironolactone (ALDACTONE) tablet 25 mg  25 mg Oral Daily Hijazi, Deatra Canter, MD      . tamsulosin (FLOMAX) capsule 0.4 mg  0.4 mg Oral QPC supper Merton Border, MD   0.4 mg at 02/17/19 2132  . umeclidinium bromide (INCRUSE ELLIPTA) 62.5 MCG/INH 1 puff  1 puff Inhalation Daily Merton Border, MD   1 puff at 02/18/19 0800    Allergies as  of 02/17/2019 - Review Complete 02/17/2019  Allergen Reaction Noted  . Bactrim [sulfamethoxazole-trimethoprim] Other (See Comments) 06/26/2013  . Levaquin [levofloxacin in d5w] Diarrhea and Other (See Comments) 02/14/2017    Family History  Problem Relation Age of Onset  . Coronary artery disease Mother   . Heart disease Mother        Before age 20  . Hyperlipidemia Mother   . Hypertension Mother   . Heart attack Mother   . Coronary artery disease Father   . Heart disease Father        After age 23  . Heart  attack Father   . Heart disease Sister        After age 50  . Heart disease Brother        After age 16  . Coronary artery disease Other        13 sibling, almost all have coronary disease and some with premature onset  . Heart disease Other        13 sibling, almost all have coronary disease and some with premature onset    Social History   Socioeconomic History  . Marital status: Married    Spouse name: Not on file  . Number of children: Not on file  . Years of education: Not on file  . Highest education level: Not on file  Occupational History  . Not on file  Social Needs  . Financial resource strain: Not on file  . Food insecurity    Worry: Not on file    Inability: Not on file  . Transportation needs    Medical: Not on file    Non-medical: Not on file  Tobacco Use  . Smoking status: Former Smoker    Quit date: 04/27/1993    Years since quitting: 25.8  . Smokeless tobacco: Never Used  Substance and Sexual Activity  . Alcohol use: No    Alcohol/week: 0.0 standard drinks  . Drug use: No  . Sexual activity: Yes  Lifestyle  . Physical activity    Days per week: Not on file    Minutes per session: Not on file  . Stress: Not on file  Relationships  . Social Herbalist on phone: Not on file    Gets together: Not on file    Attends religious service: Not on file    Active member of club or organization: Not on file    Attends meetings of clubs or organizations: Not on file    Relationship status: Not on file  . Intimate partner violence    Fear of current or ex partner: Not on file    Emotionally abused: Not on file    Physically abused: Not on file    Forced sexual activity: Not on file  Other Topics Concern  . Not on file  Social History Narrative   Social History:   Married    Tobacco Use - No.    Alcohol Use - yes         Family History:   Mother died at age 70 of coronary disease.  Father died at 16 of coronary disease.  He has 13  siblings, almost all of whom have coronary disease and some with premature onset.    Review of Systems: Positive for: GI: Described in detail in HPI.    Gen: fatigue, weakness, malaise,Denies any fever, chills, rigors, night sweats, anorexia,  involuntary weight loss, and sleep disorder CV: peripheral edema, Denies chest pain,  angina, palpitations, syncope, orthopnea, PND and claudication. Resp:  Dyspnea, Denies, cough, sputum, wheezing, coughing up blood. GU : Has an indwelling Foley catheter since June 2020(gets changed on a monthly basis) MS: Denies joint pain or swelling.  Denies muscle weakness, cramps, atrophy.  Derm: Denies rash, itching, oral ulcerations, hives, unhealing ulcers.  Psych: Denies depression, anxiety, memory loss, suicidal ideation, hallucinations,  and confusion. Heme: Denies bruising, bleeding, and enlarged lymph nodes. Neuro:  Denies any headaches, dizziness, paresthesias. Endo:  Denies any problems with DM, thyroid, adrenal function.  Physical Exam: Vital signs in last 24 hours: Temp:  [97.4 F (36.3 C)-97.8 F (36.6 C)] 97.8 F (36.6 C) (09/16 0412) Pulse Rate:  [35-104] 65 (09/16 0412) Resp:  [16-30] 20 (09/16 0412) BP: (106-133)/(42-75) 114/45 (09/16 0412) SpO2:  [99 %-100 %] 99 % (09/16 0800) Weight:  [78.1 kg-78.8 kg] 78.5 kg (09/16 0412) Last BM Date: 02/17/19  General:   Elderly, frail, pleasant, appears his stated age Head:  Normocephalic and atraumatic. Eyes:  Sclera clear, no icterus.   Mild pallor Ears:  Normal auditory acuity. Nose:  No deformity, discharge,  or lesions. Mouth:  No deformity or lesions.  Oropharynx pink & moist. Neck:  Supple; no masses or thyromegaly. Lungs: On oxygen via nasal cannula ,clear throughout to auscultation.   No wheezes, crackles, or rhonchi. No acute distress. Heart:  Irregular rhythm; no murmurs, clicks, rubs,  or gallops. Extremities:  Mild bipedal pitting edema. Neurologic:  Alert and  oriented x4;   grossly normal neurologically. Skin:  Intact without significant lesions or rashes.Scattered bruises. Psych:  Alert and cooperative. Normal mood and affect. Abdomen:  Soft, nontender and nondistended. No masses, hepatosplenomegaly or hernias noted. Normal bowel sounds, without guarding, and without rebound.         Lab Results: Recent Labs    02/17/19 1333 02/18/19 0428  WBC 7.4 7.3  HGB 8.7* 8.4*  HCT 26.9* 26.8*  PLT 261 308   BMET Recent Labs    02/17/19 1333 02/18/19 0428  NA 128* 133*  K 4.0 4.4  CL 90* 95*  CO2 26 28  GLUCOSE 120* 122*  BUN 12 10  CREATININE 0.75 0.76  CALCIUM 8.8* 9.2   LFT Recent Labs    02/17/19 1333  PROT 5.8*  ALBUMIN 3.3*  AST 29  ALT 24  ALKPHOS 111  BILITOT 0.6   PT/INR No results for input(s): LABPROT, INR in the last 72 hours.  Studies/Results: Dg Chest Port 1 View  Result Date: 02/17/2019 CLINICAL DATA:  85 year old male with shortness of breath. EXAM: PORTABLE CHEST 1 VIEW COMPARISON:  Chest radiograph dated 01/22/2019 FINDINGS: There is stable cardiomegaly. No vascular congestion or edema. Small bilateral pleural effusions similar or slightly decreased since the prior radiograph. There bibasilar hazy densities which may represent atelectasis or infiltrate. No pneumothorax. Median sternotomy wires, CABG vascular clips, and aortic valve repair. Atherosclerotic calcification of the aorta. No acute osseous pathology. IMPRESSION: 1. Small bilateral pleural effusions seen bibasilar atelectasis versus infiltrate. 2. Cardiomegaly. Electronically Signed   By: Anner Crete M.D.   On: 02/17/2019 13:32    Impression: Anemia, FOBT positive stool, no melena or hematochezia noted Patient had a CAT scan from 02/2018 which showed a 16 x 9 x 15 mm enhancement, potential ulcerated mass in proximal lesser curvature of the stomach-as per his wife this was evaluated by an endoscopy in [place] and no findings were reported.  Atrial fibrillation  on Eliquis Congestive heart failure, small bilateral pleural  effusions and bibasilar atelectasis versus infiltrate noted on chest x-ray COPD   Plan: No indication for endoscopy/colonoscopy at this point. Recommend monitor H&H and transfuse as needed. Discussed with patient's hospitalist Dr. Broadus John, okay to resume Eliquis from GI standpoint. Please is recall GI as needed   LOS: 1 day   Ronnette Juniper, MD  02/18/2019, 8:08 AM  Pager [phone] If no answer or after 5 PM call [phone]

## 2019-02-18 NOTE — Patient Outreach (Signed)
[facility]) Care Management  02/18/2019  [name] 19-Mar-1932 [id number]     Case Closure   RN CM received notification that patient has been enrolled in [facility] and was admitted to the hospital on 02/17/2019.      Plan: RN CM will close case at this time.   Enzo Montgomery, RN,BSN,CCM [facility] Telephonic Care Management Coordinator Direct Phone: [phone] Toll Free: [phone] Fax: [phone]

## 2019-02-18 NOTE — Consult Note (Addendum)
The patient has been seen in conjunction with Vin Bhaget, PA-C. All aspects of care have been considered and discussed. The patient has been personally interviewed, examined, and all clinical data has been reviewed.   Patient is near euvolemic although he is 6 pounds above his dry weight demonstrated on discharge 01/19/2019.  Since discharge from skilled nursing to home, his appetite has significantly increased.  Admitted now with recurrent dyspnea on exertion with weight gain.  He has had a modest diuresis since admission.  Dyspnea is multifactorial related to COPD, combined mitral stenosis and mitral regurgitation, diastolic heart failure, and anemia.    Acute on chronic diastolic heart failure has improved and the patient has achieved modest diuresis.  We recommend an additional higher dose of IV diuretic to gauge urine response.  Check kidney function in a.m. and reassess recommendations for home diuretic therapy.  We will either switch from furosemide to torsemide or add intermittent metolazone to furosemide therapy.  We will check be met in a.m.  Overall prognosis is poor.  DNR is in place.  May need to consider palliative care.  Cardiology Consultation:   Patient ID: [name] MRN: [id number]; DOB: 08/11/1931  Admit date: 02/17/2019 Date of Consult: 02/18/2019  Primary Care Provider: Nicoletta Dress, MD Primary Cardiologist: Kirk Ruths, MD   Patient Profile:   [name] is a 85 y.o. male with a hx of of CAD s/p CABG, permanent Afib, AS s/p TAVR, PVD, s/p  right carotid endarterectomy in 2014, mitral stenosis and regurgitation, pleural effusions s/p multiple thoracentesis, chronic urinary retention with indwelling Foley catheter and COPD on home oxygen who is being seen today for the evaluation of CHF at the request of Dr. Broadus John.   Echocardiogram in January 2019 noted severe aortic stenosis.  He underwent cardiac catheterization 08/2017 showed occluded native  vessels with SVG to first OM and second OM patent as well as LIMA to the LAD.  There was moderate stenosis of the apical LAD.  He was referred to the structural heart team and ultimately underwent TAVR in April 2019.  Follow-up echocardiogram 1 month later showed normal LV function with bioprosthetic aortic valve with mean gradient of 14 mmHg with mild to moderate mitral stenosis and mild mitral regurg.  Patient had multiple admission for worsening shortness of breath which felt multifactorial secondary to COPD exacerbation and acute diastolic CHF.  He had bilateral thoracentesis July 2020.  Last echocardiogram July 30 01/22/2019 showed LV function of 50 to 55%, intermediate diastolic dysfunction, TAVR with  mean gradient of 3mHg.  Mild to moderate mitral regurgitation and moderate to severe mitral stenosis with mean gradient of 13 mmHg.  History of Present Illness:   Mr. EBollmanlast admitted August 2020 reason.  He was diuresed 10 L.  Discharge weight 166 LB.  [facility].  He was doing excellent initially.  However presented to hospital yesterday with 3 days history of decrease urine output, abdominal distention and 4 pound weight gain.  He was admitted for acute on chronic diastolic heart failure.  Patient had diarrhea prior to presentation.  Stool guaiac positive.  BNP 299.  Troponin high-sensitivity is 25.  Vitamin B12 1210.  Sodium 128>>133.  Bilateral pleural effusion on chest x-ray.  Recommended conservative management by gastroenterologist per note.  Eliquis continued.  Hemoglobin 8.4 today >> it was 10 on last admission 8/24.   Treated with IV Lasix with 1.4 L diuresis.  His  breathing and edema has been improved.  His weight is 173 LB which is still significantly higher than last discharge weight of 166lb.   Heart Pathway Score:     Past Medical History:  Diagnosis Date  . Acute myocardial infarction, unspecified site, initial episode of care   . Anemia    . Anxiety   . Aortic stenosis    . Atrial fibrillation 06/27/2013  . Atrial fibrillation ([place])    a. permanent, on Coumadin for anticoagulation  . BPPV (benign paroxysmal positional vertigo) 2015  . CAD (coronary artery disease)    a. s/p CABG in 1994 b. cath in 2015 showing normal LM, 100% LCx and RCA stenosis with 50-60% prox LAD stenosis and 100% mid-LAD stenosis; patent sequential SVG-OM2-OM3 and patent LIMA-LAD with PTCA of distal LAD via LIMA graft performed at that time  . Carotid stenosis   . CAROTID STENOSIS- moderate 09/09/2008   Qualifier: Diagnosis of  By: Burnett Kanaris    . Cellulitis 11/15/2018  . Chest pain with moderate risk of acute coronary syndrome 06/27/2013  . CHF (congestive heart failure) ([place])   . Chronic anticoagulation 06/27/2013  . COPD (chronic obstructive pulmonary disease) ([place])   . Depression   . Diabetes mellitus without complication ([place])    FASTING 98-120S  . GERD (gastroesophageal reflux disease)   . Gout attack- on steroid dose pack 06/27/2013  . History of blood transfusion   . History of peptic ulcer disease   . Hyperlipidemia   . Hypertension   . Left atrial severely dilated 01/19/2019  . Myocardial infarction ([place])    X2  . NSTEMI (non-ST elevated myocardial infarction) ([place]) 08/2013  . 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 PEPTIC ULCER DISEASE, HX OF 09/09/2008   Qualifier: Diagnosis of  By: Burnett Kanaris    . PERIPHERAL VASCULAR DISEASE 09/09/2008   Qualifier: Diagnosis of  By: Burnett Kanaris    . Peripheral vascular disease ([place])   . Pneumonia    HX OF  . Presence of indwelling Foley catheter 01/20/2019  . Pressure injury of skin 12/17/2018  . Renal artery stenosis ([place])   . S/P thoracentesis   . Severe aortic stenosis 09/17/2017  . Sleep apnea    OXYGEN AT NIGHT 2L   . Stress fracture of calcaneus 2018   Dr Paulla Dolly [facility])  . Tachycardia- beta blocker increased  06/26/2013  . UNSPECIFIED ANEMIA 09/09/2008   Qualifier: Diagnosis of  By: Burnett Kanaris    . UNSPECIFIED CEREBROVASCULAR DISEASE 09/09/2008   Qualifier: Diagnosis of  By: Burnett Kanaris    . Unstable angina ([place]) 08/31/2013  . Vertigo 06/27/2013    Past Surgical History:  Procedure Laterality Date  . AORTIC ARCH ANGIOGRAPHY N/A 08/14/2017   Procedure: AORTIC ARCH ANGIOGRAPHY;  Surgeon: Sherren Mocha, MD;  Location: [place] [facility];  Service: Cardiovascular;  Laterality: N/A;  . BOWEL RESECTION  07/2013   Bowel perforation  . CARDIAC CATHETERIZATION    . CAROTID ENDARTERECTOMY Right 2014   Curt Jews, MD  . COLON SURGERY    . CORONARY ARTERY BYPASS GRAFT  1994  . ENDARTERECTOMY Right 01/05/2013   Procedure: ENDARTERECTOMY CAROTID;  Surgeon: Rosetta Posner, MD;  Location: [place];  Service: Vascular;  Laterality: Right;  . INCISION AND DRAINAGE Left 11/16/2018   Procedure: INCISION AND DRAINAGE LEFT FOREARM/ARM;  Surgeon: Leanora Cover, MD;  Location: [place];  Service: Orthopedics;  Laterality: Left;  . KNEE SURGERY  08/2003   left knee/ARTHROSCOPIC  . LEFT HEART CATHETERIZATION WITH CORONARY/GRAFT ANGIOGRAM N/A 09/01/2013   Procedure: LEFT HEART CATHETERIZATION WITH Beatrix Fetters;  Surgeon: Sinclair Grooms, MD;  Location: [facility] CATH LAB;  Service: Cardiovascular;  Laterality: N/A;  . PATCH ANGIOPLASTY Right 01/05/2013   Procedure: PATCH ANGIOPLASTY;  Surgeon: Rosetta Posner, MD;  Location: [place];  Service: Vascular;  Laterality: Right;  . PERCUTANEOUS CORONARY STENT INTERVENTION (PCI-S)  09/01/2013   Procedure: PERCUTANEOUS CORONARY STENT INTERVENTION (PCI-S);  Surgeon: Sinclair Grooms, MD;  Location: [facility] CATH LAB;  Service: Cardiovascular;;  . removal of bleeding ulcer  1994  . RENAL ARTERY STENT     stenting of the left renal artery as well as a cutting balloon angioplasty for treatment of in-stent restenosis coronary artery bypass grafting in 1994.  [name] RIGHT/LEFT HEART CATH AND  CORONARY/GRAFT ANGIOGRAPHY N/A 08/14/2017   Procedure: RIGHT/LEFT HEART CATH AND CORONARY/GRAFT ANGIOGRAPHY;  Surgeon: Sherren Mocha, MD;  Location: [place] [facility];  Service: Cardiovascular;  Laterality: N/A;  . TEE WITHOUT CARDIOVERSION N/A 09/17/2017   Procedure: TRANSESOPHAGEAL ECHOCARDIOGRAM (TEE);  Surgeon: Sherren Mocha, MD;  Location: [place];  Service: Open Heart Surgery;  Laterality: N/A;     Inpatient Medications: Scheduled Meds: . apixaban  5 mg Oral BID  . atorvastatin  80 mg Oral QHS  . brimonidine  1 drop Both Eyes BID  . digoxin  0.125 mg Oral Daily  . ferrous sulfate  325 mg Oral Q breakfast  . furosemide  40 mg Intravenous Q12H  . insulin aspart  0-5 Units Subcutaneous QHS  . insulin aspart  0-9 Units Subcutaneous TID WC  . levalbuterol  0.63 mg Nebulization TID  . mouth rinse  15 mL Mouth Rinse BID  . metoprolol tartrate  12.5 mg Oral BID  . mometasone-formoterol  2 puff Inhalation BID  . multivitamin with minerals  1 tablet Oral Daily  . pantoprazole (PROTONIX) IV  40 mg Intravenous QHS  . polyethylene glycol  17 g Oral BID  . potassium chloride SA  30 mEq Oral BID  . senna-docusate  1 tablet Oral Daily  . sodium chloride flush  3 mL Intravenous Q12H  . spironolactone  25 mg Oral Daily  . tamsulosin  0.4 mg Oral QPC supper  . umeclidinium bromide  1 puff Inhalation Daily   Continuous Infusions: . sodium chloride     PRN Meds: sodium chloride, acetaminophen **OR** acetaminophen, albuterol, HYDROcodone-acetaminophen, sodium chloride flush  Allergies:    Allergies  Allergen Reactions  . Bactrim [Sulfamethoxazole-Trimethoprim] Other (See Comments)    "Allergic," per MAR  . Levaquin [Levofloxacin In D5w] Diarrhea and Other (See Comments)    "Allergic," per [facility]    Social History:   Social History   Socioeconomic History  . Marital status: Married    Spouse name: Not on file  . Number of children: Not on file  . Years of education: Not on file  .  Highest education level: Not on file  Occupational History  . Not on file  Social Needs  . Financial resource strain: Not on file  . Food insecurity    Worry: Not on file    Inability: Not on file  . Transportation needs    Medical: Not on file    Non-medical: Not on file  Tobacco Use  . Smoking status: Former Smoker    Quit date: 04/27/1993    Years since quitting: 25.8  . Smokeless tobacco: Never Used  Substance and Sexual Activity  . Alcohol use: No    Alcohol/week: 0.0 standard drinks  . Drug use: No  . Sexual activity: Yes  Lifestyle  . Physical activity    Days per week: Not on file    Minutes per session: Not on file  . Stress: Not on file  Relationships  . Social Herbalist on phone: Not on file    Gets together: Not on file    Attends religious service: Not on file    Active member of club or organization: Not on file    Attends meetings of clubs or organizations: Not on file    Relationship status: Not on file  . Intimate partner violence    Fear of current or ex partner: Not on file    Emotionally abused: Not on file    Physically abused: Not on file    Forced sexual activity: Not on file  Other Topics Concern  . Not on file  Social History Narrative   Social History:   Married    Tobacco Use - No.    Alcohol Use - yes         Family History:   Mother died at age 42 of coronary disease.  Father died at 1 of coronary disease.  He has 13 siblings, almost all of whom have coronary disease and some with premature onset.    Family History:   Family History  Problem Relation Age of Onset  . Coronary artery disease Mother   . Heart disease Mother        Before age 35  . Hyperlipidemia Mother   . Hypertension Mother   . Heart attack Mother   . Coronary artery disease Father   . Heart disease Father        After age 52  . Heart attack Father   . Heart disease Sister        After age 65  . Heart disease Brother        After age 84  .  Coronary artery disease Other        13 sibling, almost all have coronary disease and some with premature onset  . Heart disease Other        13 sibling, almost all have coronary disease and some with premature onset     ROS:  Please see the history of present illness.  All other ROS reviewed and negative.     Physical Exam/Data:   Vitals:   02/18/19 0800 02/18/19 0815 02/18/19 1135 02/18/19 1431  BP:  (!) 125/46 (!) 95/31   Pulse:  67 89   Resp:  (!) 21 17   Temp:  97.8 F (36.6 C) 97.6 F (36.4 C)   TempSrc:  Oral Oral   SpO2: 99% 100% 100% 100%  Weight:      Height:        Intake/Output Summary (Last 24 hours) at 02/18/2019 1554 Last data filed at 02/18/2019 1344 Gross per 24 hour  Intake 1200 ml  Output 1850 ml  Net -650 ml   Last 3 Weights 02/18/2019 02/17/2019 02/17/2019  Weight (lbs) 173 lb 172 lb 1.6 oz 173 lb 12.8 oz  Weight (kg) 78.472 kg 78.064 kg 78.835 kg     Body mass index is 24.13 kg/m.  General: Thin male in no acute distress HEENT: normal Lymph: no adenopathy Neck: no JVD Endocrine:  No thryomegaly Vascular: No carotid bruits; FA pulses 2+ bilaterally without  bruits  Cardiac:  normal S1, S2; RRR; no murmur  Lungs: Faint bibasilar Rales Abd: soft, nontender, no hepatomegaly  Ext: Trace  edema Musculoskeletal:  No deformities, BUE and BLE strength normal and equal Skin: warm and dry  Neuro:  CNs 2-12 intact, no focal abnormalities noted Psych:  Normal affect   EKG:  The EKG was personally reviewed and demonstrates: Atrial fibrillation at rate of 73 bpm, chronic left bundle branch block Telemetry:  Telemetry was personally reviewed and demonstrates: Atrial fibrillation at rate of 50 to 60s  Relevant CV Studies:  Echocardiogram 01/01/2019: 1. The left ventricle has low normal systolic function, with an ejection fraction of 50-55%. The cavity size was normal. There is mildly increased left ventricular wall thickness. Left ventricular diastolic  Doppler parameters are indeterminate. There is abnormal septal motion consistent with post-operative status. 2. The right ventricle has mildly reduced systolic function. The cavity was normal. There is no increase in right ventricular wall thickness. Right ventricular systolic pressure is moderately elevated with an estimated pressure of 55.2 mmHg. 3. Left atrial size was severely dilated. 4. A 26 Edwards Sapien bioprosthetic aortic valve (TAVR) valve is present in the aortic position. Procedure Date: 09/17/17 Echo findings shows no evidence of rocking, dehiscence. Mean systolic gradient 11 mmHg, calculated valve area approximately 2 cm2  using LVOT diameter of 2.6 cm and LVOT TVI 14 cm. Likely trivial prosthetic aortic valve regurgitation, though challenging to assess whether this is prosthetic or paravalvular due to image quality. 5. The mitral valve is abnormal. There is moderate mitral annular calcification present. Mitral valve regurgitation is mild to moderate by color flow Doppler. Moderate-severe mitral valve stenosis. Mean diastolic gradient 13 mmHg at HR 100-105. 6. Tricuspid valve regurgitation is moderate. 7. When compared to the prior study: 11/24/2018 - no significant change in LV function. No change in degree of mitral valve stenosis. Stable function of aortic prosthesis. Side by side comparison of images performed.  AORTIC ARCH ANGIOGRAPHY  08/2017  RIGHT/LEFT HEART CATH AND CORONARY/GRAFT ANGIOGRAPHY  Conclusion    Prox RCA lesion is 100% stenosed.  LIMA graft was visualized by angiography and is normal in caliber.  The graft exhibits no disease.  Mid LM to Dist LM lesion is 30% stenosed.  Ost Cx to Prox Cx lesion is 100% stenosed.  Ost LAD to Prox LAD lesion is 80% stenosed.  Prox LAD lesion is 100% stenosed.  Seq SVG-.  Mid LAD to Dist LAD lesion is 70% stenosed.  Hemodynamic findings consistent with severe pulmonary hypertension.  There is severe aortic  valve stenosis. There is trivial (1+) aortic regurgitation.   1. Severe multivessel CAD with chronic total occlusion of the RCA, LCx, and LAD 2. S/p CABG with continued patency of the sequential SVG-OM1 and OM2 and LIMA-LAD 3. Moderate stenosis of the apical LAD at the site of previous angioplasty 4. Severe aortic stenosis with calculated AVA 0.89 square cm 5. Severe pulmonary HTN likely secondary to diastolic heart failure/left heart disease 6. Aortic root angiography demonstrates a valve deployment angle of LAO23, CRA12  Plan: continue multidisciplinary heart valve approach under TAVR evaluation, will increase furosemide to 40 mg daily, resume pradaxa tomorrow     Laboratory Data:  High Sensitivity Troponin:   Recent Labs  Lab 01/20/19 0023 02/17/19 1333  TROPONINIHS 26* 25*     Chemistry Recent Labs  Lab 02/17/19 1333 02/18/19 0428  NA 128* 133*  K 4.0 4.4  CL 90* 95*  CO2 26 28  GLUCOSE 120* 122*  BUN 12 10  CREATININE 0.75 0.76  CALCIUM 8.8* 9.2  GFRNONAA >60 >60  GFRAA >60 >60  ANIONGAP 12 10    Recent Labs  Lab 02/17/19 1333  PROT 5.8*  ALBUMIN 3.3*  AST 29  ALT 24  ALKPHOS 111  BILITOT 0.6   Hematology Recent Labs  Lab 02/17/19 1333 02/18/19 0428 02/18/19 0928  WBC 7.4 7.3  --   RBC 3.17* 3.17* 3.06*  HGB 8.7* 8.4*  --   HCT 26.9* 26.8*  --   MCV 84.9 84.5  --   MCH 27.4 26.5  --   MCHC 32.3 31.3  --   RDW 16.3* 16.6*  --   PLT 261 308  --    BNP Recent Labs  Lab 02/17/19 1333  BNP 299.4*     Radiology/Studies:  Dg Chest Port 1 View  Result Date: 02/17/2019 CLINICAL DATA:  85 year old male with shortness of breath. EXAM: PORTABLE CHEST 1 VIEW COMPARISON:  Chest radiograph dated 01/22/2019 FINDINGS: There is stable cardiomegaly. No vascular congestion or edema. Small bilateral pleural effusions similar or slightly decreased since the prior radiograph. There bibasilar hazy densities which may represent atelectasis or infiltrate. No  pneumothorax. Median sternotomy wires, CABG vascular clips, and aortic valve repair. Atherosclerotic calcification of the aorta. No acute osseous pathology. IMPRESSION: 1. Small bilateral pleural effusions seen bibasilar atelectasis versus infiltrate. 2. Cardiomegaly. Electronically Signed   By: Anner Crete M.D.   On: 02/17/2019 13:32    Assessment and Plan:   1. Acute on chronic diastolic heart failure - BNP 299.  Chest x-ray with small bilateral pleural effusion.  Patient has a chronic indwelling catheter.  Presented with weakness, low urine output, abdominal fullness and weight gain. -Certainly acute diastolic heart failure could be due to valvular heart disease or Foley. -He diuresed 1.4 L with improved symptoms. -  His weight is 173 LB today but is still significantly higher than last discharge weight of 166lb. -He got IV lasix 66m x 2, now on 436mBID>> will give IV lasix 6064monight and reassess in AM. If blood pressure is soft may hold spironolactone.  -He was taking Lasix 80 mg twice daily prior to presentation.  This could be continued at discharge with addition of metolazone as needed/scheduled once or twice/week or consider changing lasix to torsemide as alternative for better absorption.   2.  Permanent atrial fibrillation -Rate controlled. -Continue Lopressor 12.5 mg twice daily, digoxin daily and Eliquis 5 mg twice daily.  3.  Acute on chronic anemia/positive stool guaiac - Hemoglobin 8.4 today >> it was 10 on last admission 8/24.  -Conservative therapy recommended by GI per note -Per primary team  4.  Status post TAVR - function well  5.  Moderate to severe mitral stenosis with mild to moderate mitral regurgitation -Last echocardiogram July 2020 as above -Not a surgical candidate.  6.  CAD status post CABG -No angina -Continue current medical therapy  7.  Hyponatremia -Improving with diuresis  8. DM - Per primary team   Dr. SmiTamala Julian see later today    For questions or updates, please contact [email or web address] Please consult [email or web address] for contact info under     SigJarrett SohoA  02/18/2019 3:54 PM

## 2019-02-18 NOTE — Discharge Instructions (Signed)

## 2019-02-18 NOTE — Evaluation (Signed)
Physical Therapy Evaluation Patient Details Name: [name] MRN: [id number] DOB: April 30, 1932 Today's Date: 02/18/2019   History of Present Illness  85 yo male admitted to ED on 9/15 with CHF exacerbation, s/s including decreased urine output, weakness, weight gain, and LE edema. PMH includes 5 hospitalization in the past 6 months, afib, CHF, COPD on 3LO2 chronically, DMII, depression, gout, PVD, unstable angina, PE, TAVR 2019, bowel resection 2015.  Clinical Impression  Pt presents with LE weakness, increased time and effort to mobilize, unsteadiness in standing requiring external support, dyspnea on exertion, and decreased activity tolerance. Pt to benefit from acute PT to address deficits. Pt ambulated hallway distance with RW with min guard assist, verbal cuing for energy conservation and distance limiting to ensure safe return to room. Pt with sats 96-100% on 3LO2 with ambulation, with work of breathing and fatigue being pt's biggest limiting factors. PT recommending HHPT, as pt was already active with [facility], HHOT, and HHRN prior to admission. PT to progress mobility as tolerated, and will continue to follow acutely.      Follow Up Recommendations Supervision for mobility/OOB;Home health PT(continue with HHPT)    Equipment Recommendations  None recommended by PT    Recommendations for Other Services       Precautions / Restrictions Precautions Precautions: Fall Restrictions Weight Bearing Restrictions: No      Mobility  Bed Mobility Overal bed mobility: Needs Assistance Bed Mobility: Supine to Sit     Supine to sit: Supervision;HOB elevated     General bed mobility comments: Supervision for safety, use of bedrails to come to sitting.  Transfers Overall transfer level: Needs assistance Equipment used: Rolling walker (2 wheeled) Transfers: Sit to/from Stand Sit to Stand: Min assist         General transfer comment: Min assist for power up, very increased  time to rise with heavy anterior leaning.  Ambulation/Gait Ambulation/Gait assistance: Min guard Gait Distance (Feet): 90 Feet Assistive device: Rolling walker (2 wheeled) Gait Pattern/deviations: Step-through pattern;Decreased stride length;Trunk flexed Gait velocity: decr   General Gait Details: Min guard for safety, verbal cuing for placement in RW and turnaround point due to pt fatigue. Pt states "I am going to need wheelchair", but PT educated pt to monitor for fatigue and turn around at point where he can make it back to room.  Stairs            Wheelchair Mobility    Modified Rankin (Stroke Patients Only)       Balance Overall balance assessment: Needs assistance Sitting-balance support: Feet supported;No upper extremity supported Sitting balance-Leahy Scale: Good     Standing balance support: Bilateral upper extremity supported;During functional activity Standing balance-Leahy Scale: Poor Standing balance comment: Reliant on bUE support                              Pertinent Vitals/Pain Pain Assessment: No/denies pain Pain Intervention(s): Monitored during session    Home Living Family/patient expects to be discharged to:: Private residence Living Arrangements: Spouse/significant other Available Help at Discharge: Family;Available 24 hours/day Type of Home: House Home Access: Stairs to enter Entrance Stairs-Rails: Psychiatric nurse of Steps: 4 Home Layout: One level Home Equipment: Cane - single point;Walker - 2 wheels;Shower seat;Bedside commode;Crutches      Prior Function Level of Independence: Needs assistance   Gait / Transfers Assistance Needed: Pt ambulates with RW, reports "I need a wheelchair for long distances"  ADL's / Homemaking Assistance Needed: Pt reports wife assists him with all ADLs        Hand Dominance   Dominant Hand: Right    Extremity/Trunk Assessment   Upper Extremity Assessment Upper  Extremity Assessment: Defer to OT evaluation    Lower Extremity Assessment Lower Extremity Assessment: Generalized weakness    Cervical / Trunk Assessment Cervical / Trunk Assessment: Kyphotic  Communication   Communication: HOH  Cognition Arousal/Alertness: Awake/alert Behavior During Therapy: WFL for tasks assessed/performed Overall Cognitive Status: Within Functional Limits for tasks assessed                                        General Comments      Exercises     Assessment/Plan    PT Assessment Patient needs continued PT services  PT Problem List Decreased strength;Decreased mobility;Decreased safety awareness;Decreased activity tolerance;Cardiopulmonary status limiting activity;Pain;Decreased balance;Decreased knowledge of use of DME       PT Treatment Interventions DME instruction;Therapeutic activities;Gait training;Therapeutic exercise;Balance training;Stair training;Functional mobility training;Patient/family education    PT Goals (Current goals can be found in the Care Plan section)  Acute Rehab PT Goals Patient Stated Goal: go home PT Goal Formulation: With patient Time For Goal Achievement: 03/04/19 Potential to Achieve Goals: Good    Frequency Min 3X/week   Barriers to discharge        Co-evaluation               AM-PAC PT "6 Clicks" Mobility  Outcome Measure Help needed turning from your back to your side while in a flat bed without using bedrails?: A Little Help needed moving from lying on your back to sitting on the side of a flat bed without using bedrails?: A Little Help needed moving to and from a bed to a chair (including a wheelchair)?: A Little Help needed standing up from a chair using your arms (e.g., wheelchair or bedside chair)?: A Little Help needed to walk in hospital room?: A Little Help needed climbing 3-5 steps with a railing? : A Little 6 Click Score: 18    End of Session Equipment Utilized During  Treatment: Gait belt;Oxygen Activity Tolerance: Patient limited by fatigue Patient left: with call bell/phone within reach;in chair;with chair alarm set Nurse Communication: Mobility status PT Visit Diagnosis: Unsteadiness on feet (R26.81);Muscle weakness (generalized) (M62.81);Difficulty in walking, not elsewhere classified (R26.2)    Time: 9379-0240 PT Time Calculation (min) (ACUTE ONLY): 18 min   Charges:   PT Evaluation $PT Eval Low Complexity: 1 Low          Ad Guttman Conception Chancy, PT Acute Rehabilitation Services Pager [phone]  Office [phone]  Maribel Luis D Elonda Husky 02/18/2019, 5:13 PM

## 2019-02-18 NOTE — Progress Notes (Addendum)
PROGRESS NOTE    [name]  VOJ:[id number] DOB: 11/27/1931 DOA: 02/17/2019 PCP: Nicoletta Dress, MD  Brief Narrative: 85 year old chronically ill gentleman with CAD, CABG chronic diastolic CHF, severe pulmonary hypertension, history of TAVR, COPD/chronic respiratory failure on 3 L home O2, chronic urinary retention with indwelling Foley catheter, presented to the emergency room yesterday evening with increasing weakness decreased urine output weight gain and lower extremity edema, his wife called cardiology office and they were directed to come to the ED yesterday   Assessment & Plan:   Acute on chronic diastolic CHF, severe pulmonary hypertension -Clinically volume overloaded, weight is up about 9 pounds from recent discharge, chest x-ray with small bilateral effusions -Continue IV Lasix, soft BP limits aggressive diuresis -Also on low-dose Aldactone which is continued -We will request cardiology input -Last echo with EF of 50 to 55%  Acute on chronic anemia -Check anemia panel -Trace heme positive stools, no overt bleeding or blood loss, could be a dilutional component from CHF as well -Appreciate GI input by Dr. Therisa Doyne, no indication for endoscopic evaluation felt at this time, recommended for conservative management, resume Eliquis and monitor hemoglobin -Will give IV iron if iron deficient  Paroxysmal atrial fibrillation -In A. fib at this time, rate controlled, continue digoxin, low-dose metoprolol, resume Eliquis, monitor hemoglobin  Hyponatremia -Likely due to volume overloaded state, improving with diuresis  CAD/CABG -Stable, monitor  Recent TAVR  COPD/chronic respiratory failure -No wheezing at this time, nebs PRN  Type 2 diabetes mellitus -Continue sliding scale insulin, oral hypoglycemics on hold  History of urinary retention with chronic indwelling Foley catheter   DVT prophylaxis: Eliquis Code Status: Full code Family Communication: No family at  bedside called and updated wife Disposition Plan: Home pending adequate diuresis  Consultants:   Cardiology   Procedures:   Antimicrobials:    Subjective: -Feels better today, reports dyspnea on exertion, edema and weight gain over the last 2 days  Objective: Vitals:   02/18/19 0412 02/18/19 0800 02/18/19 0815 02/18/19 1135  BP: (!) 114/45  (!) 125/46 (!) 95/31  Pulse: 65  67 89  Resp: 20  (!) 21 17  Temp: 97.8 F (36.6 C)  97.8 F (36.6 C) 97.6 F (36.4 C)  TempSrc: Oral  Oral Oral  SpO2: 100% 99% 100% 100%  Weight: 78.5 kg     Height:        Intake/Output Summary (Last 24 hours) at 02/18/2019 1149 Last data filed at 02/18/2019 1004 Gross per 24 hour  Intake 720 ml  Output 1650 ml  Net -930 ml   Filed Weights   02/17/19 1241 02/17/19 1901 02/18/19 0412  Weight: 78.8 kg 78.1 kg 78.5 kg    Examination:  General exam: Elderly frail gentleman sitting up in bed, AAO x2, no distress Respiratory system: Decreased breath sounds at both bases  cardiovascular system: S1 & S2 heard, irregularly irregular  gastrointestinal system: Abdomen is nondistended, soft and nontender.Normal bowel sounds heard. Central nervous system: Alert and oriented. No focal neurological deficits. Extremities: 1+ edema Skin: No rashes, lesions or ulcers Psychiatry:Mood & affect appropriate.     Data Reviewed:   CBC: Recent Labs  Lab 02/17/19 1333 02/18/19 0428  WBC 7.4 7.3  HGB 8.7* 8.4*  HCT 26.9* 26.8*  MCV 84.9 84.5  PLT 261 993   Basic Metabolic Panel: Recent Labs  Lab 02/17/19 1333 02/18/19 0428  NA 128* 133*  K 4.0 4.4  CL 90* 95*  CO2 26 28  GLUCOSE 120* 122*  BUN 12 10  CREATININE 0.75 0.76  CALCIUM 8.8* 9.2   GFR: Estimated Creatinine Clearance: 71.9 mL/min (by C-G formula based on SCr of 0.76 mg/dL). Liver Function Tests: Recent Labs  Lab 02/17/19 1333  AST 29  ALT 24  ALKPHOS 111  BILITOT 0.6  PROT 5.8*  ALBUMIN 3.3*   No results for input(s):  LIPASE, AMYLASE in the last 168 hours. No results for input(s): AMMONIA in the last 168 hours. Coagulation Profile: No results for input(s): INR, PROTIME in the last 168 hours. Cardiac Enzymes: No results for input(s): CKTOTAL, CKMB, CKMBINDEX, TROPONINI in the last 168 hours. BNP (last 3 results) No results for input(s): PROBNP in the last 8760 hours. HbA1C: No results for input(s): HGBA1C in the last 72 hours. CBG: Recent Labs  Lab 02/17/19 2153 02/18/19 0628  GLUCAP 218* 130*   Lipid Profile: No results for input(s): CHOL, HDL, LDLCALC, TRIG, CHOLHDL, LDLDIRECT in the last 72 hours. Thyroid Function Tests: No results for input(s): TSH, T4TOTAL, FREET4, T3FREE, THYROIDAB in the last 72 hours. Anemia Panel: Recent Labs    02/18/19 0928  VITAMINB12 1,210*  FOLATE 21.5  FERRITIN 65  TIBC 328  IRON 95  RETICCTPCT 2.5   Urine analysis:    Component Value Date/Time   COLORURINE AMBER (A) 01/19/2019 1647   APPEARANCEUR CLOUDY (A) 01/19/2019 1647   LABSPEC 1.012 01/19/2019 1647   PHURINE 9.0 (H) 01/19/2019 1647   GLUCOSEU NEGATIVE 01/19/2019 1647   HGBUR MODERATE (A) 01/19/2019 1647   BILIRUBINUR NEGATIVE 01/19/2019 1647   KETONESUR NEGATIVE 01/19/2019 1647   PROTEINUR 100 (A) 01/19/2019 1647   UROBILINOGEN 0.2 12/23/2012 1026   NITRITE POSITIVE (A) 01/19/2019 1647   LEUKOCYTESUR MODERATE (A) 01/19/2019 1647   Sepsis Labs: @LABRCNTIP (procalcitonin:4,lacticidven:4)  ) Recent Results (from the past 240 hour(s))  SARS CORONAVIRUS 2 (TAT 6-24 HRS) Nasopharyngeal Nasopharyngeal Swab     Status: None   Collection Time: 02/17/19  4:05 PM   Specimen: Nasopharyngeal Swab  Result Value Ref Range Status   SARS Coronavirus 2 NEGATIVE NEGATIVE Final    Comment: (NOTE) SARS-CoV-2 target nucleic acids are NOT DETECTED. The SARS-CoV-2 RNA is generally detectable in upper and lower respiratory specimens during the acute phase of infection. Negative results do not preclude  SARS-CoV-2 infection, do not rule out co-infections with other pathogens, and should not be used as the sole basis for treatment or other patient management decisions. Negative results must be combined with clinical observations, patient history, and epidemiological information. The expected result is Negative. Fact Sheet for Patients: SugarRoll.be Fact Sheet for Healthcare Providers: https://www.woods-mathews.com/ This test is not yet approved or cleared by the [place] FDA and  has been authorized for detection and/or diagnosis of SARS-CoV-2 by FDA under an Emergency Use Authorization (EUA). This EUA will remain  in effect (meaning this test can be used) for the duration of the COVID-19 declaration under Section 56 4(b)(1) of the Act, 21 U.S.C. section 360bbb-3(b)(1), unless the authorization is terminated or revoked sooner. Performed at [facility], [place] [address], [place], [place] [address]          Radiology Studies: Dg Chest Plummer 1 View  Result Date: 02/17/2019 CLINICAL DATA:  85 year old male with shortness of breath. EXAM: PORTABLE CHEST 1 VIEW COMPARISON:  Chest radiograph dated 01/22/2019 FINDINGS: There is stable cardiomegaly. No vascular congestion or edema. Small bilateral pleural effusions similar or slightly decreased since the prior radiograph. There bibasilar hazy densities which  may represent atelectasis or infiltrate. No pneumothorax. Median sternotomy wires, CABG vascular clips, and aortic valve repair. Atherosclerotic calcification of the aorta. No acute osseous pathology. IMPRESSION: 1. Small bilateral pleural effusions seen bibasilar atelectasis versus infiltrate. 2. Cardiomegaly. Electronically Signed   By: Anner Crete M.D.   On: 02/17/2019 13:32        Scheduled Meds: . apixaban  5 mg Oral BID  . atorvastatin  80 mg Oral QHS  . brimonidine  1 drop Both Eyes BID  . digoxin  0.125 mg Oral Daily   . ferrous sulfate  325 mg Oral Q breakfast  . furosemide  60 mg Intravenous Q12H  . insulin aspart  0-5 Units Subcutaneous QHS  . insulin aspart  0-9 Units Subcutaneous TID WC  . levalbuterol  0.63 mg Nebulization TID  . mouth rinse  15 mL Mouth Rinse BID  . metoprolol tartrate  12.5 mg Oral BID  . mometasone-formoterol  2 puff Inhalation BID  . multivitamin with minerals  1 tablet Oral Daily  . pantoprazole (PROTONIX) IV  40 mg Intravenous QHS  . polyethylene glycol  17 g Oral BID  . potassium chloride SA  30 mEq Oral BID  . senna-docusate  1 tablet Oral Daily  . sodium chloride flush  3 mL Intravenous Q12H  . spironolactone  25 mg Oral Daily  . tamsulosin  0.4 mg Oral QPC supper  . umeclidinium bromide  1 puff Inhalation Daily   Continuous Infusions: . sodium chloride       LOS: 1 day    Time spent: 35min    Domenic Polite, MD [facility]  02/18/2019, 11:49 AM

## 2019-02-18 NOTE — Plan of Care (Signed)
  Problem: Education: Goal: Knowledge of General Education information will improve Description: Including pain rating scale, medication(s)/side effects and non-pharmacologic comfort measures Outcome: Progressing   Problem: Health Behavior/Discharge Planning: Goal: Ability to manage health-related needs will improve Outcome: Progressing   Problem: Activity: Goal: Risk for activity intolerance will decrease Outcome: Progressing   

## 2019-02-18 NOTE — Consult Note (Signed)
   [facility] CM Inpatient Consult   02/18/2019  YAMATO KOPF [birth year] [id number]    Patient screened for 35% extreme high risk score for unplanned readmissions, with 5 hospitalizations in the past 6 months and a 30 day readmission; and to check if potential [facility] services are needed by this Medicare/ Next/Gen ACO patient.   Chart review reveals from MD notes on  as follows:  85 year old chronically ill gentleman with CAD, CABG chronic diastolic CHF, severe pulmonary hypertension, history of TAVR, COPD/chronic respiratory failure on 3 L home O2, chronic urinary retention with indwelling Foley catheter,    presented to the emergency room with increasing weakness, decreased urine output, weight gain and lower extremity edema, his wife called cardiology office and they were directed to come to the ED. (Acute on chronic diastolic CHF, severe pulmonary hypertension, Paroxysmal atrial fibrillation).    Review of medical record and [facility] post acute care coordinator note confirms that patient has recently been enrolled with [facility] program.  This writer called and spoke with Tammy who confirmed that patient is active for home palliative care program and will be followed post discharge.   Patient will be followed by [facility], and no other [facility] follow-up needed at this time.   For questions and additional information, please call:  Lionel Woodberry A. Marshell Dilauro, BSN, RN-BC [facility] Liaison Cell: [phone]

## 2019-02-19 DIAGNOSIS — Z952 Presence of prosthetic heart valve: Secondary | ICD-10-CM

## 2019-02-19 DIAGNOSIS — J449 Chronic obstructive pulmonary disease, unspecified: Secondary | ICD-10-CM

## 2019-02-19 DIAGNOSIS — I4819 Other persistent atrial fibrillation: Secondary | ICD-10-CM

## 2019-02-19 DIAGNOSIS — I10 Essential (primary) hypertension: Secondary | ICD-10-CM

## 2019-02-19 LAB — BASIC METABOLIC PANEL
Anion gap: 10 (ref 5–15)
BUN: 8 mg/dL (ref 8–23)
CO2: 28 mmol/L (ref 22–32)
Calcium: 8.8 mg/dL — ABNORMAL LOW (ref 8.9–10.3)
Chloride: 93 mmol/L — ABNORMAL LOW (ref 98–111)
Creatinine, Ser: 0.78 mg/dL (ref 0.61–1.24)
GFR calc Af Amer: >60 mL/min (ref >60)
GFR calc non Af Amer: >60 mL/min (ref >60)
Glucose, Bld: 133 mg/dL — ABNORMAL HIGH (ref 70–99)
Potassium: 4.4 mmol/L (ref 3.5–5.1)
Sodium: 131 mmol/L — ABNORMAL LOW (ref 135–145)

## 2019-02-19 LAB — CBC
HCT: 24.4 % — ABNORMAL LOW (ref 39.0–52.0)
Hemoglobin: 8 g/dL — ABNORMAL LOW (ref 13.0–17.0)
MCH: 27.4 pg (ref 26.0–34.0)
MCHC: 32.8 g/dL (ref 30.0–36.0)
MCV: 83.6 fL (ref 80.0–100.0)
Platelets: 256 10*3/uL (ref 150–400)
RBC: 2.92 MIL/uL — ABNORMAL LOW (ref 4.22–5.81)
RDW: 16.6 % — ABNORMAL HIGH (ref 11.5–15.5)
WBC: 6.7 10*3/uL (ref 4.0–10.5)
nRBC: 0 % (ref 0.0–0.2)

## 2019-02-19 LAB — GLUCOSE, CAPILLARY
Glucose-Capillary: 133 mg/dL — ABNORMAL HIGH (ref 70–99)
Glucose-Capillary: 134 mg/dL — ABNORMAL HIGH (ref 70–99)
Glucose-Capillary: 126 mg/dL — ABNORMAL HIGH (ref 70–99)
Glucose-Capillary: 143 mg/dL — ABNORMAL HIGH (ref 70–99)

## 2019-02-19 MED ORDER — PANTOPRAZOLE SODIUM 40 MG PO TBEC
40.0000 mg | DELAYED_RELEASE_TABLET | Freq: Every day | ORAL | Status: DC
  Administered 2019-02-19 – 2019-02-22 (×4): 40 mg via ORAL
  Filled 2019-02-19 (×4): qty 1

## 2019-02-19 NOTE — Plan of Care (Signed)

## 2019-02-19 NOTE — Progress Notes (Signed)
PROGRESS NOTE    [name]  LOV:[id number] DOB: [birth year] DOA: 02/17/2019 PCP: [name] Dress, MD  Brief Narrative: 85 year old chronically ill gentleman with CAD, CABG chronic diastolic CHF, severe pulmonary hypertension, history of TAVR, COPD/chronic respiratory failure on 3 L home O2, chronic urinary retention with indwelling Foley catheter, presented to the emergency room yesterday evening with increasing weakness decreased urine output weight gain and lower extremity edema, his wife called cardiology office and they were directed to come to the ED yesterday   Assessment & Plan:   Acute on chronic diastolic CHF, severe pulmonary hypertension -Only slightly volume overloaded, I doubt if weight is accurate, 174 pounds on admission, up from 166 at recent discharge -Clinically improving with diuresis, he is -1.7 L -Also on low-dose Aldactone which is continued -Appreciate cardiology input -Last echo with EF of 50 to 55%  -Palliative medicine consulted for goals of care, overall prognosis felt to be poor  Acute on chronic anemia -Anemia panel unremarkable, suggests chronic disease -Trace heme positive stools, no overt bleeding or blood loss,  -Appreciate GI input by Dr. Therisa [name], no indication for endoscopic evaluation felt at this time, recommended for conservative management, hemoglobin in the low 8 range, baseline is around 9, recent hemoglobin of 10 was off his usual range -May be a component of bone marrow suppression, unfortunately given advanced age and extensive list of comorbidities would not pursue aggressive work-up for this  Paroxysmal atrial fibrillation -In A. fib at this time, rate controlled, continue digoxin, low-dose metoprolol, resume Eliquis, monitor hemoglobin  Moderate to severe mitral stenosis with regurgitation -Not a surgical candidate  Hyponatremia -Likely due to volume overloaded state, improving with diuresis  CAD/CABG -Stable, monitor  Recent  TAVR  COPD/chronic respiratory failure -3 L home O2 at baseline -No wheezing at this time, nebs PRN  Type 2 diabetes mellitus -Continue sliding scale insulin, oral hypoglycemics on hold  History of urinary retention with chronic indwelling Foley catheter -Continue Foley catheter  DVT prophylaxis: Eliquis Code Status: Discussed with patient and spouse, per spouse he is DNR Family Communication: No family at bedside called and updated wife Disposition Plan: Home likely in 46 hours  Consultants:   Cardiology   Procedures:   Antimicrobials:    Subjective: -Reports that he is breathing better, complains of his heel wound  Objective: Vitals:   02/18/19 2200 02/19/19 0047 02/19/19 0422 02/19/19 0905  BP: (!) 120/58  (!) 107/42   Pulse: 70  62   Resp:   20   Temp:   98.1 F (36.7 C)   TempSrc:   Oral   SpO2:   100% 98%  Weight:  78.9 kg    Height:        Intake/Output Summary (Last 24 hours) at 02/19/2019 1125 Last data filed at 02/19/2019 8841 Gross per 24 hour  Intake 1083 ml  Output 1950 ml  Net -867 ml   Filed Weights   02/17/19 1901 02/18/19 0412 02/19/19 0047  Weight: 78.1 kg 78.5 kg 78.9 kg    Examination:  Gen: Elderly frail gentleman sitting up in bed, AAO x3, no distress HEENT: PERRLA, Neck supple, no JVD Lungs: Decreased breath sounds at both bases CVS: S1-S2/irregularly irregular rhythm Abd: soft, Non tender, non distended, BS present Extremities: Trace edema, small dark ulcer left heel Skin: no new rashes Psychiatry:Mood & affect appropriate.     Data Reviewed:   CBC: Recent Labs  Lab 02/17/19 1333 02/18/19 0428 02/19/19 0520  WBC 7.4 7.3  6.7  HGB 8.7* 8.4* 8.0*  HCT 26.9* 26.8* 24.4*  MCV 84.9 84.5 83.6  PLT 261 308 315   Basic Metabolic Panel: Recent Labs  Lab 02/17/19 1333 02/18/19 0428 02/19/19 0520  NA 128* 133* 131*  K 4.0 4.4 4.4  CL 90* 95* 93*  CO2 26 28 28   GLUCOSE 120* 122* 133*  BUN 12 10 8   CREATININE 0.75  0.76 0.78  CALCIUM 8.8* 9.2 8.8*   GFR: Estimated Creatinine Clearance: 71.9 mL/min (by C-G formula based on SCr of 0.78 mg/dL). Liver Function Tests: Recent Labs  Lab 02/17/19 1333  AST 29  ALT 24  ALKPHOS 111  BILITOT 0.6  PROT 5.8*  ALBUMIN 3.3*   No results for input(s): LIPASE, AMYLASE in the last 168 hours. No results for input(s): AMMONIA in the last 168 hours. Coagulation Profile: No results for input(s): INR, PROTIME in the last 168 hours. Cardiac Enzymes: No results for input(s): CKTOTAL, CKMB, CKMBINDEX, TROPONINI in the last 168 hours. BNP (last 3 results) No results for input(s): PROBNP in the last 8760 hours. HbA1C: No results for input(s): HGBA1C in the last 72 hours. CBG: Recent Labs  Lab 02/18/19 0628 02/18/19 1136 02/18/19 1602 02/18/19 2129 02/19/19 0606  GLUCAP 130* 215* 115* 152* 133*   Lipid Profile: No results for input(s): CHOL, HDL, LDLCALC, TRIG, CHOLHDL, LDLDIRECT in the last 72 hours. Thyroid Function Tests: No results for input(s): TSH, T4TOTAL, FREET4, T3FREE, THYROIDAB in the last 72 hours. Anemia Panel: Recent Labs    02/18/19 0928  VITAMINB12 1,210*  FOLATE 21.5  FERRITIN 65  TIBC 328  IRON 95  RETICCTPCT 2.5   Urine analysis:    Component Value Date/Time   COLORURINE AMBER (A) 01/19/2019 1647   APPEARANCEUR CLOUDY (A) 01/19/2019 1647   LABSPEC 1.012 01/19/2019 1647   PHURINE 9.0 (H) 01/19/2019 1647   GLUCOSEU NEGATIVE 01/19/2019 1647   HGBUR MODERATE (A) 01/19/2019 1647   BILIRUBINUR NEGATIVE 01/19/2019 1647   KETONESUR NEGATIVE 01/19/2019 1647   PROTEINUR 100 (A) 01/19/2019 1647   UROBILINOGEN 0.2 12/23/2012 1026   NITRITE POSITIVE (A) 01/19/2019 1647   LEUKOCYTESUR MODERATE (A) 01/19/2019 1647   Sepsis Labs: @LABRCNTIP (procalcitonin:4,lacticidven:4)  ) Recent Results (from the past 240 hour(s))  SARS CORONAVIRUS 2 (TAT 6-24 HRS) Nasopharyngeal Nasopharyngeal Swab     Status: None   Collection Time: 02/17/19   4:05 PM   Specimen: Nasopharyngeal Swab  Result Value Ref Range Status   SARS Coronavirus 2 NEGATIVE NEGATIVE Final    Comment: (NOTE) SARS-CoV-2 target nucleic acids are NOT DETECTED. The SARS-CoV-2 RNA is generally detectable in upper and lower respiratory specimens during the acute phase of infection. Negative results do not preclude SARS-CoV-2 infection, do not rule out co-infections with other pathogens, and should not be used as the sole basis for treatment or other patient management decisions. Negative results must be combined with clinical observations, patient history, and epidemiological information. The expected result is Negative. Fact Sheet for Patients: SugarRoll.be Fact Sheet for Healthcare Providers: https://www.woods-mathews.com/ This test is not yet approved or cleared by the [place] FDA and  has been authorized for detection and/or diagnosis of SARS-CoV-2 by FDA under an Emergency Use Authorization (EUA). This EUA will remain  in effect (meaning this test can be used) for the duration of the COVID-19 declaration under Section 56 4(b)(1) of the Act, 21 U.S.C. section 360bbb-3(b)(1), unless the authorization is terminated or revoked sooner. Performed at [facility], [place] [address]  [place], [place] [address]          Radiology Studies: Dg Chest Port 1 View  Result Date: 02/17/2019 CLINICAL DATA:  85 year old male with shortness of breath. EXAM: PORTABLE CHEST 1 VIEW COMPARISON:  Chest radiograph dated 01/22/2019 FINDINGS: There is stable cardiomegaly. No vascular congestion or edema. Small bilateral pleural effusions similar or slightly decreased since the prior radiograph. There bibasilar hazy densities which may represent atelectasis or infiltrate. No pneumothorax. Median sternotomy wires, CABG vascular clips, and aortic valve repair. Atherosclerotic calcification of the aorta. No acute osseous pathology.  IMPRESSION: 1. Small bilateral pleural effusions seen bibasilar atelectasis versus infiltrate. 2. Cardiomegaly. Electronically Signed   By: Anner Crete M.D.   On: 02/17/2019 13:32        Scheduled Meds: . apixaban  5 mg Oral BID  . atorvastatin  80 mg Oral QHS  . brimonidine  1 drop Both Eyes BID  . digoxin  0.125 mg Oral Daily  . ferrous sulfate  325 mg Oral Q breakfast  . furosemide  60 mg Intravenous Q12H  . insulin aspart  0-5 Units Subcutaneous QHS  . insulin aspart  0-9 Units Subcutaneous TID WC  . levalbuterol  0.63 mg Nebulization TID  . mouth rinse  15 mL Mouth Rinse BID  . metoprolol tartrate  12.5 mg Oral BID  . mometasone-formoterol  2 puff Inhalation BID  . multivitamin with minerals  1 tablet Oral Daily  . pantoprazole  40 mg Oral Daily  . polyethylene glycol  17 g Oral BID  . potassium chloride SA  30 mEq Oral BID  . senna-docusate  1 tablet Oral Daily  . sodium chloride flush  3 mL Intravenous Q12H  . tamsulosin  0.4 mg Oral QPC supper  . umeclidinium bromide  1 puff Inhalation Daily   Continuous Infusions: . sodium chloride       LOS: 2 days    Time spent: 26min    Domenic Polite, MD [facility]  02/19/2019, 11:25 AM

## 2019-02-19 NOTE — Consult Note (Signed)
Loop Nurse wound consult note Patient receiving care in [place].  Consult completed remotely after review of record and discussion with primary RN, Delfina Redwood, via telephone Reason for Consult: Right heel wound Wound type: purple discoloration, no loss of overlying tissue, no fluid or blood beneath heel tissue per Delfina Redwood.  Consistent with DTPI.  Patient could not tolerate the use of a heel foam per [place]. Pressure Injury POA: No Measurement: To be provided by the bedside RN in the flowsheet section Wound RCV:KFMMCR discoloration Drainage (amount, consistency, odor) none Periwound: WNL Dressing procedure/placement/frequency:  Apply iodine (from the iodine swabsticks in clean utility) to the purple area on the left heel, allow to air dry.  Float the heels off of the mattress with pillows.  Monitor the wound area(s) for worsening of condition such as: Signs/symptoms of infection,  Increase in size,  Development of or worsening of odor, Development of pain, or increased pain at the affected locations.  Notify the medical team if any of these develop.  Thank you for the consult.  Discussed plan of care with the bedside nurse.  [place] nurse will not follow at this time.  Please re-consult the [place] team if needed.  Val Riles, RN, MSN, CWOCN, CNS-BC, pager [phone]

## 2019-02-19 NOTE — Progress Notes (Signed)
PT Cancellation Note  Patient Details Name: [name] MRN: [id number] DOB: 05-Mar-1932   Cancelled Treatment:    Reason Eval/Treat Not Completed: Patient declined, no reason specified. Pt reports he had just returned to bed after mobilizing with mobility tech and does not wish to participate in therapy at this time. Will check back as time allows.   Benjiman Core, PTA Pager [phone] Acute Rehab  Allena Katz 02/19/2019, 2:12 PM

## 2019-02-19 NOTE — Progress Notes (Addendum)
The patient has been seen in conjunction with Reino Bellis, NP. All aspects of care have been considered and discussed. The patient has been personally interviewed, examined, and all clinical data has been reviewed.   This is a complicated clinical situation.  Poor long-term prognosis.  Still attempting IV diuresis.  An additional 24 hours of twice daily Lasix is recommended.  Will probably need combination therapy with intermittent metolazone at home to prevent volume overload.  Chronic anemia, A. fib, and mitral valve disease are all contributing to refractory/recurrent heart failure.   Will need to have goals of care conversation if has not already occurred via his primary care providers  Progress Note  Patient Name: [name] Date of Encounter: 02/19/2019  Primary Cardiologist: Kirk Ruths, MD   Subjective   Breathing has improved per his report. Sitting up eating breakfast.   Inpatient Medications    Scheduled Meds: . apixaban  5 mg Oral BID  . atorvastatin  80 mg Oral QHS  . brimonidine  1 drop Both Eyes BID  . digoxin  0.125 mg Oral Daily  . ferrous sulfate  325 mg Oral Q breakfast  . furosemide  60 mg Intravenous Q12H  . insulin aspart  0-5 Units Subcutaneous QHS  . insulin aspart  0-9 Units Subcutaneous TID WC  . levalbuterol  0.63 mg Nebulization TID  . mouth rinse  15 mL Mouth Rinse BID  . metoprolol tartrate  12.5 mg Oral BID  . mometasone-formoterol  2 puff Inhalation BID  . multivitamin with minerals  1 tablet Oral Daily  . pantoprazole (PROTONIX) IV  40 mg Intravenous QHS  . polyethylene glycol  17 g Oral BID  . potassium chloride SA  30 mEq Oral BID  . senna-docusate  1 tablet Oral Daily  . sodium chloride flush  3 mL Intravenous Q12H  . spironolactone  25 mg Oral Daily  . tamsulosin  0.4 mg Oral QPC supper  . umeclidinium bromide  1 puff Inhalation Daily   Continuous Infusions: . sodium chloride     PRN Meds: sodium chloride,  acetaminophen **OR** acetaminophen, albuterol, HYDROcodone-acetaminophen, Melatonin, sodium chloride flush   Vital Signs    Vitals:   02/18/19 2053 02/18/19 2200 02/19/19 0047 02/19/19 0422  BP:  (!) 120/58  (!) 107/42  Pulse:  70  62  Resp:    20  Temp:    98.1 F (36.7 C)  TempSrc:    Oral  SpO2: 97%   100%  Weight:   78.9 kg   Height:        Intake/Output Summary (Last 24 hours) at 02/19/2019 0835 Last data filed at 02/19/2019 6195 Gross per 24 hour  Intake 1563 ml  Output 1950 ml  Net -387 ml   Last 3 Weights 02/19/2019 02/18/2019 02/17/2019  Weight (lbs) 174 lb 173 lb 172 lb 1.6 oz  Weight (kg) 78.926 kg 78.472 kg 78.064 kg      Telemetry    Afib rate controlled - Personally Reviewed  ECG    N/a - Personally Reviewed  Physical Exam  Frail older male. Sitting on the bedside GEN: No acute distress.   Neck: No JVD Cardiac: Irreg Irreg, soft systolic murmur, no rubs, or gallops.  Respiratory: Clear to auscultation bilaterally. GI: Soft, nontender, non-distended  MS: No edema; No deformity. Neuro:  Nonfocal  Psych: Normal affect   Labs    High Sensitivity Troponin:   Recent Labs  Lab 02/17/19 1333  TROPONINIHS 25*  Chemistry Recent Labs  Lab 02/17/19 1333 02/18/19 0428 02/19/19 0520  NA 128* 133* 131*  K 4.0 4.4 4.4  CL 90* 95* 93*  CO2 26 28 28   GLUCOSE 120* 122* 133*  BUN 12 10 8   CREATININE 0.75 0.76 0.78  CALCIUM 8.8* 9.2 8.8*  PROT 5.8*  --   --   ALBUMIN 3.3*  --   --   AST 29  --   --   ALT 24  --   --   ALKPHOS 111  --   --   BILITOT 0.6  --   --   GFRNONAA >60 >60 >60  GFRAA >60 >60 >60  ANIONGAP 12 10 10      Hematology Recent Labs  Lab 02/17/19 1333 02/18/19 0428 02/18/19 0928 02/19/19 0520  WBC 7.4 7.3  --  6.7  RBC 3.17* 3.17* 3.06* 2.92*  HGB 8.7* 8.4*  --  8.0*  HCT 26.9* 26.8*  --  24.4*  MCV 84.9 84.5  --  83.6  MCH 27.4 26.5  --  27.4  MCHC 32.3 31.3  --  32.8  RDW 16.3* 16.6*  --  16.6*  PLT 261 308  --   256    BNP Recent Labs  Lab 02/17/19 1333  BNP 299.4*     DDimer No results for input(s): DDIMER in the last 168 hours.   Radiology    Dg Chest Port 1 View  Result Date: 02/17/2019 CLINICAL DATA:  85 year old male with shortness of breath. EXAM: PORTABLE CHEST 1 VIEW COMPARISON:  Chest radiograph dated 01/22/2019 FINDINGS: There is stable cardiomegaly. No vascular congestion or edema. Small bilateral pleural effusions similar or slightly decreased since the prior radiograph. There bibasilar hazy densities which may represent atelectasis or infiltrate. No pneumothorax. Median sternotomy wires, CABG vascular clips, and aortic valve repair. Atherosclerotic calcification of the aorta. No acute osseous pathology. IMPRESSION: 1. Small bilateral pleural effusions seen bibasilar atelectasis versus infiltrate. 2. Cardiomegaly. Electronically Signed   By: Anner Crete M.D.   On: 02/17/2019 13:32    Cardiac Studies   N/a   Patient Profile     85 y.o. male  with a hx of of CAD s/p CABG, permanent Afib, AS s/p TAVR, PVD, s/p right carotid endarterectomy in 2014, mitral stenosis and regurgitation, pleural effusions s/p multiple thoracentesis, chronic urinary retention with indwelling Foley catheterand COPD on home oxygen who was seen for the evaluation of CHF at the request of Dr. Broadus John.   Assessment & Plan    1. Acute on chronic diastolic heart failure: BNP 299.  Chest x-ray with small bilateral pleural effusion.  Patient has a chronic indwelling catheter.  Presented with weakness, low urine output, abdominal fullness and weight gain. Certainly acute diastolic heart failure could be due to valvular heart disease or Foley. -He diuresed 1.7 L with improved symptoms. Weight increased from 173>174lbs? Still dyspneic on exam. Will continue IV lasix 60mg  BID today, likely transition to oral dosing tomorrow. Hold spiro to  -He was taking Lasix 80 mg twice daily prior to presentation.  This could be  continued at discharge with addition of metolazone as needed/scheduled once or twice/week or consider changing lasix to torsemide as alternative for better absorption.   2.  Permanent atrial fibrillation: Rate controlled. -Continue Lopressor 12.5 mg twice daily, digoxin daily and Eliquis 5 mg twice daily.  3.  Acute on chronic anemia/positive stool guaiac: Hemoglobin 8.4>>8.0 today >> it was 10 on last admission 8/24.  -Conservative therapy  recommended by GI per note -Per primary team  4.  Status post TAVR: function well  5.  Moderate to severe mitral stenosis with mild to moderate mitral regurgitation: Last echocardiogram July 2020 as above -Not a surgical candidate.  6.  CAD status post CABG: No angina -Continue current medical therapy  7.  Hyponatremia: stable. Follow BMET  8. DM: - Per primary team   For questions or updates, please contact [place] Please consult [email or web address] for contact info under   Signed, Reino Bellis, NP  02/19/2019, 8:35 AM

## 2019-02-20 DIAGNOSIS — Z515 Encounter for palliative care: Secondary | ICD-10-CM

## 2019-02-20 DIAGNOSIS — I480 Paroxysmal atrial fibrillation: Secondary | ICD-10-CM

## 2019-02-20 DIAGNOSIS — J411 Mucopurulent chronic bronchitis: Secondary | ICD-10-CM

## 2019-02-20 LAB — BASIC METABOLIC PANEL
Anion gap: 7 (ref 5–15)
BUN: 8 mg/dL (ref 8–23)
CO2: 30 mmol/L (ref 22–32)
Calcium: 9.2 mg/dL (ref 8.9–10.3)
Chloride: 97 mmol/L — ABNORMAL LOW (ref 98–111)
Creatinine, Ser: 0.94 mg/dL (ref 0.61–1.24)
GFR calc Af Amer: >60 mL/min (ref >60)
GFR calc non Af Amer: >60 mL/min (ref >60)
Glucose, Bld: 136 mg/dL — ABNORMAL HIGH (ref 70–99)
Potassium: 4.8 mmol/L (ref 3.5–5.1)
Sodium: 134 mmol/L — ABNORMAL LOW (ref 135–145)

## 2019-02-20 LAB — GLUCOSE, CAPILLARY
Glucose-Capillary: 121 mg/dL — ABNORMAL HIGH (ref 70–99)
Glucose-Capillary: 131 mg/dL — ABNORMAL HIGH (ref 70–99)
Glucose-Capillary: 139 mg/dL — ABNORMAL HIGH (ref 70–99)
Glucose-Capillary: 169 mg/dL — ABNORMAL HIGH (ref 70–99)

## 2019-02-20 LAB — CBC
HCT: 26.4 % — ABNORMAL LOW (ref 39.0–52.0)
Hemoglobin: 8.2 g/dL — ABNORMAL LOW (ref 13.0–17.0)
MCH: 26.7 pg (ref 26.0–34.0)
MCHC: 31.1 g/dL (ref 30.0–36.0)
MCV: 86 fL (ref 80.0–100.0)
Platelets: 283 10*3/uL (ref 150–400)
RBC: 3.07 MIL/uL — ABNORMAL LOW (ref 4.22–5.81)
RDW: 16.9 % — ABNORMAL HIGH (ref 11.5–15.5)
WBC: 7.1 10*3/uL (ref 4.0–10.5)
nRBC: 0 % (ref 0.0–0.2)

## 2019-02-20 LAB — DIGOXIN LEVEL: Digoxin Level: 1.6 ng/mL (ref 0.8–2.0)

## 2019-02-20 MED ORDER — POTASSIUM CHLORIDE CRYS ER 20 MEQ PO TBCR
20.0000 meq | EXTENDED_RELEASE_TABLET | Freq: Two times a day (BID) | ORAL | Status: DC
  Administered 2019-02-20 – 2019-02-22 (×5): 20 meq via ORAL
  Filled 2019-02-20 (×5): qty 1

## 2019-02-20 MED ORDER — LIDOCAINE 5 % EX PTCH
1.0000 | MEDICATED_PATCH | CUTANEOUS | Status: DC
  Administered 2019-02-20: 1 via TRANSDERMAL
  Filled 2019-02-20 (×2): qty 1

## 2019-02-20 MED ORDER — TORSEMIDE 20 MG PO TABS
40.0000 mg | ORAL_TABLET | Freq: Two times a day (BID) | ORAL | Status: DC
  Administered 2019-02-20 – 2019-02-21 (×3): 40 mg via ORAL
  Filled 2019-02-20 (×3): qty 2

## 2019-02-20 NOTE — Progress Notes (Addendum)
The patient has been seen in conjunction with Vin Bhagat, PAC. All aspects of care have been considered and discussed. The patient has been personally interviewed, examined, and all clinical data has been reviewed.   Agree with contents of this note.  Decided to use torsemide as it is more efficiently absorbed from the gut.  Will need to monitor weights and urine output closely.  Kidney function is still normal and therefore if weight does not continue to decrease on oral therapy, return to aggressive IV therapy may be necessary prior to discharge.  Overall illness/comorbidities portend very poor prognosis.  We will check a digoxin level today.  Progress Note  Patient Name: [name] Date of Encounter: 02/20/2019  Primary Cardiologist: Kirk Ruths, MD   Subjective   Breathing back to normal. Foley is not working. Abdominal bloating.   Inpatient Medications    Scheduled Meds: . apixaban  5 mg Oral BID  . atorvastatin  80 mg Oral QHS  . brimonidine  1 drop Both Eyes BID  . digoxin  0.125 mg Oral Daily  . ferrous sulfate  325 mg Oral Q breakfast  . insulin aspart  0-5 Units Subcutaneous QHS  . insulin aspart  0-9 Units Subcutaneous TID WC  . levalbuterol  0.63 mg Nebulization TID  . mouth rinse  15 mL Mouth Rinse BID  . metoprolol tartrate  12.5 mg Oral BID  . mometasone-formoterol  2 puff Inhalation BID  . multivitamin with minerals  1 tablet Oral Daily  . pantoprazole  40 mg Oral Daily  . polyethylene glycol  17 g Oral BID  . senna-docusate  1 tablet Oral Daily  . sodium chloride flush  3 mL Intravenous Q12H  . tamsulosin  0.4 mg Oral QPC supper  . umeclidinium bromide  1 puff Inhalation Daily   Continuous Infusions: . sodium chloride     PRN Meds: sodium chloride, acetaminophen **OR** acetaminophen, albuterol, HYDROcodone-acetaminophen, Melatonin, sodium chloride flush   Vital Signs    Vitals:   02/19/19 2148 02/19/19 2152 02/20/19 0507 02/20/19 0900   BP:  (!) 163/71 (!) 122/50 (!) 131/57  Pulse:  77 77 73  Resp:  17 17 (!) 22  Temp:  (!) 97.4 F (36.3 C) (!) 97.4 F (36.3 C) 97.7 F (36.5 C)  TempSrc:  Oral Oral Oral  SpO2: 100% 100% 100% 100%  Weight:   78.1 kg   Height:        Intake/Output Summary (Last 24 hours) at 02/20/2019 1010 Last data filed at 02/20/2019 0719 Gross per 24 hour  Intake 483 ml  Output 1501 ml  Net -1018 ml   Last 3 Weights 02/20/2019 02/19/2019 02/18/2019  Weight (lbs) 172 lb 3.2 oz 174 lb 173 lb  Weight (kg) 78.109 kg 78.926 kg 78.472 kg      Telemetry    afib at controlled rate - Personally Reviewed  ECG    N/A - Personally Reviewed  Physical Exam   GEN: frail elderly male in no acute distress.   Neck: No JVD Cardiac: Ir IR, + murmurs, rubs, or gallops.  Respiratory: Clear to auscultation bilaterally. GI: Soft, nontender, non-distended  MS: No edema; No deformity. Neuro:  Nonfocal  Psych: Normal affect   Labs    High Sensitivity Troponin:   Recent Labs  Lab 02/17/19 1333  TROPONINIHS 25*      Chemistry Recent Labs  Lab 02/17/19 1333 02/18/19 0428 02/19/19 0520 02/20/19 0529  NA 128* 133* 131* 134*  K 4.0 4.4 4.4 4.8  CL 90* 95* 93* 97*  CO2 26 28 28 30   GLUCOSE 120* 122* 133* 136*  BUN 12 10 8 8   CREATININE 0.75 0.76 0.78 0.94  CALCIUM 8.8* 9.2 8.8* 9.2  PROT 5.8*  --   --   --   ALBUMIN 3.3*  --   --   --   AST 29  --   --   --   ALT 24  --   --   --   ALKPHOS 111  --   --   --   BILITOT 0.6  --   --   --   GFRNONAA >60 >60 >60 >60  GFRAA >60 >60 >60 >60  ANIONGAP 12 10 10 7      Hematology Recent Labs  Lab 02/18/19 0428 02/18/19 0928 02/19/19 0520 02/20/19 0529  WBC 7.3  --  6.7 7.1  RBC 3.17* 3.06* 2.92* 3.07*  HGB 8.4*  --  8.0* 8.2*  HCT 26.8*  --  24.4* 26.4*  MCV 84.5  --  83.6 86.0  MCH 26.5  --  27.4 26.7  MCHC 31.3  --  32.8 31.1  RDW 16.6*  --  16.6* 16.9*  PLT 308  --  256 283    BNP Recent Labs  Lab 02/17/19 1333  BNP 299.4*      Radiology    No results found.  Cardiac Studies   Non this admission   Patient Profile     85 y.o. male with a hx of of CAD s/p CABG, permanent Afib, AS s/p TAVR, PVD,s/pright carotid endarterectomyin 2014, mitral stenosisand regurgitation,pleural effusions s/p multiple thoracentesis, chronic urinary retention with indwelling Foley catheterand COPD on homeoxygenwho was seen for the evaluation ofCHFat the request of Dr. Broadus John.  Assessment & Plan    1. Acute on chronic diastolic heart failure: BNP 299. Chest x-ray with small bilateral pleural effusion. Patient has a chronic indwelling catheter.  -He diuresed 2.6 L with improved symptoms with 1 lb weight loss (173lb today). Recent discharge weight is 166lb.  - Unable to urinate in past 24 hours and has abdominal distances. I think his foley malfunctioning leading to current issue.  - Start Torsemide 40mg  BID with supplemental potassium.  Spironolactone has been stopped.   2.Permanent atrial fibrillation: Rate controlled. -Continue Lopressor 12.5 mg twice daily, digoxin daily and Eliquis 5 mg twice daily.  3.Acute on chronic anemia/positive stool guaiac:  - Hgb stable at 8-Conservative therapy recommended by GI per note -Per primary team  4.Status post TAVR: function well  5.Moderate to severe mitral stenosis with mild to moderate mitral regurgitation: Last echocardiogram July 2020 as above -Not a surgical candidate.  6.CAD status post CABG: No angina -Continue current medical therapy  7.Hyponatremia: stable. Follow BMET  8. DM: - Per primary team   [facility] will sign off.   Medication Recommendations:  As above Other recommendations (labs, testing, etc):  BMET tomorrow and in week Follow up as an outpatient:  Will arrage  For questions or updates, please contact [place] Please consult [email or web address] for contact info under        SignedLeanor Kail, PA   02/20/2019, 10:10 AM

## 2019-02-20 NOTE — Consult Note (Signed)
Consultation Note Date: 02/20/2019   Patient Name: [name]  DOB: May 11, 1932  MRN: [id number]  Age / Sex: 85 y.o., male  PCP: [name] Dress, MD Referring Physician: Domenic Polite, MD  Reason for Consultation: Establishing goals of care  HPI/Patient Profile: 85 y.o. male "[name]" with past medical history of CAD s/p CABG, DHF, Severe pulmonary HTN, severe mitral stenosis, afib, recent TAVR, COPD on 3L, chronic urinary retention with indwelling foley, chronic pain. who was admitted on 02/17/2019 with volume overload secondary to heart failure.  Of note [name] has been admitted 5x in 6 months.   Clinical Assessment and Goals of Care:  I have reviewed medical records including EPIC notes, labs and imaging, received report from the care team, assessed the patient and then met at the bedside along with the patient's wife, [name], on the phone to discuss diagnosis prognosis, GOC, EOL wishes, disposition and options.  I introduced Palliative Medicine as specialized medical care for people living with serious illness. It focuses on providing relief from the symptoms and stress of a serious illness. The goal is to improve quality of life for both the patient and the family.  We discussed a brief life review of the patient. 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  He and [name] live independently.  They have 1 son who lives next door, one son who lives in [place] and many grand children.  [name] mentions that he has 3 children from a previous marriage, but [name] quickly adds that they are not in touch with them.  As far as functional and nutritional status [name] is able to get around with a walker.  His appetite has been ok but he has difficulty with a no salt diet.  We discussed his current illness and what it means in the larger context of his on-going  co-morbidities.  Natural disease trajectory and expectations at EOL were discussed.  [name] and [name] understand that his heart disease is very severe and that he will eventually die of heart disease. Per [name], "I've accepted that".  The couple asks multiple times will he be discharged tomorrow?  Each time I tell them that it depends on how well the torsemide works and whether or not it helps to get the fluid off.    I attempted to elicit values and goals of care important to the patient.  Being at home and spending time with family is important to [name]. The difference between aggressive medical intervention and comfort care was considered in light of the patient's goals of care.  At this point his quality of life is acceptable and he would like to continue to come to the hospital when needed.   Hospice and Palliative Care services outpatient were explained and offered.  The family has a referral to [facility] that was arranged prior to this admission.  An appointment has been set for the [facility] RN to come to the house.  [name] complained of pain from the wound in his right heel.  He also complained of sacral pain from his bed sore.  Questions and concerns were addressed.   The family was encouraged to call with questions or concerns.    Primary Decision Maker:  PATIENT.  His wife [name] is his default HCPOA.    SUMMARY OF RECOMMENDATIONS    Will order lidocaine patch to be placed on RLE to ease heel pain.  WOC requested to evaluate right heel and sacral area and make recommendations  Patient will have Palliative Care on discharge thru [facility]"  DNR/DNI.  PMT will follow from a distance.  If he is unable to successfully diurese or declines inpatient we will revisit for further discussion.  Code Status/Advance Care Planning:  DNR   Symptom Management:   Lidocaine patch.  Additional Recommendations (Limitations, Scope,  Preferences):  Full Scope Treatment  Palliative Prophylaxis:   Frequent Pain Assessment, wound care.  Psycho-social/Spiritual:   Desire for further Chaplaincy support:  Patient is [name] and attends church.  Chaplain would be welcomed.  Prognosis:   Likely less than 6 months given recurrent hospitalizations for heart failure and the limited interventions available given the patient's frailty.    Discharge Planning: Home with Palliative Services      Primary Diagnoses: Present on Admission: . Essential hypertension, benign . Atrial fibrillation ([place]) . Severe Pulmonary hypertension ([place]) . Chronic hypoxemic respiratory failure ([place]) . COPD (chronic obstructive pulmonary disease) ([place]) . Acute on chronic diastolic heart failure ([place])   I have reviewed the medical record, interviewed the patient and family, and examined the patient. The following aspects are pertinent.  Past Medical History:  Diagnosis Date  . Acute myocardial infarction, unspecified site, initial episode of care   . Anemia   . Anxiety   . Aortic stenosis    . Atrial fibrillation 06/27/2013  . Atrial fibrillation ([place])    a. permanent, on Coumadin for anticoagulation  . BPPV (benign paroxysmal positional vertigo) 2015  . CAD (coronary artery disease)    a. s/p CABG in 1994 b. cath in 2015 showing normal LM, 100% LCx and RCA stenosis with 50-60% prox LAD stenosis and 100% mid-LAD stenosis; patent sequential SVG-OM2-OM3 and patent LIMA-LAD with PTCA of distal LAD via LIMA graft performed at that time  . Carotid stenosis   . CAROTID STENOSIS- moderate 09/09/2008   Qualifier: Diagnosis of  By: Burnett Kanaris    . Cellulitis 11/15/2018  . Chest pain with moderate risk of acute coronary syndrome 06/27/2013  . CHF (congestive heart failure) ([place])   . Chronic anticoagulation 06/27/2013  . COPD (chronic obstructive pulmonary disease) ([place])   . Depression   . Diabetes mellitus without complication ([place])     FASTING 98-120S  . GERD (gastroesophageal reflux disease)   . Gout attack- on steroid dose pack 06/27/2013  . History of blood transfusion   . History of peptic ulcer disease   . Hyperlipidemia   . Hypertension   . Left atrial severely dilated 01/19/2019  . Myocardial infarction ([place])    X2  . NSTEMI (non-ST elevated myocardial infarction) ([place]) 08/2013  . 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 PEPTIC ULCER DISEASE, HX OF 09/09/2008   Qualifier: Diagnosis of  By: Burnett Kanaris    . PERIPHERAL VASCULAR DISEASE 09/09/2008   Qualifier: Diagnosis of  By: Burnett Kanaris    . Peripheral vascular disease ([place])   .  Pneumonia    HX OF  . Presence of indwelling Foley catheter 01/20/2019  . Pressure injury of skin 12/17/2018  . Renal artery stenosis ([place])   . S/P thoracentesis   . Severe aortic stenosis 09/17/2017  . Sleep apnea    OXYGEN AT NIGHT 2L [place]  . Stress fracture of calcaneus 2018   Dr Paulla Dolly [facility])  . Tachycardia- beta blocker increased 06/26/2013  . UNSPECIFIED ANEMIA 09/09/2008   Qualifier: Diagnosis of  By: Burnett Kanaris    . UNSPECIFIED CEREBROVASCULAR DISEASE 09/09/2008   Qualifier: Diagnosis of  By: Burnett Kanaris    . Unstable angina ([place]) 08/31/2013  . Vertigo 06/27/2013   Social History   Socioeconomic History  . Marital status: Married    Spouse name: Not on file  . Number of children: Not on file  . Years of education: Not on file  . Highest education level: Not on file  Occupational History  . Not on file  Social Needs  . Financial resource strain: Not on file  . Food insecurity    Worry: Not on file    Inability: Not on file  . Transportation needs    Medical: Not on file    Non-medical: Not on file  Tobacco Use  . Smoking status: Former Smoker    Quit date: 04/27/1993    Years since quitting: 25.8  . Smokeless tobacco: Never Used  Substance and Sexual Activity  . Alcohol use: No     Alcohol/week: 0.0 standard drinks  . Drug use: No  . Sexual activity: Yes  Lifestyle  . Physical activity    Days per week: Not on file    Minutes per session: Not on file  . Stress: Not on file  Relationships  . Social Herbalist on phone: Not on file    Gets together: Not on file    Attends religious service: Not on file    Active member of club or organization: Not on file    Attends meetings of clubs or organizations: Not on file    Relationship status: Not on file  Other Topics Concern  . Not on file  Social History Narrative   Social History:   Married    Tobacco Use - No.    Alcohol Use - yes         Family History:   Mother died at age 26 of coronary disease.  Father died at 46 of coronary disease.  He has 13 siblings, almost all of whom have coronary disease and some with premature onset.   Family History  Problem Relation Age of Onset  . Coronary artery disease Mother   . Heart disease Mother        Before age 28  . Hyperlipidemia Mother   . Hypertension Mother   . Heart attack Mother   . Coronary artery disease Father   . Heart disease Father        After age 38  . Heart attack Father   . Heart disease Sister        After age 55  . Heart disease Brother        After age 4  . Coronary artery disease Other        13 sibling, almost all have coronary disease and some with premature onset  . Heart disease Other        13 sibling, almost all have coronary disease and some with premature onset   Scheduled  Meds: . apixaban  5 mg Oral BID  . atorvastatin  80 mg Oral QHS  . brimonidine  1 drop Both Eyes BID  . digoxin  0.125 mg Oral Daily  . ferrous sulfate  325 mg Oral Q breakfast  . insulin aspart  0-5 Units Subcutaneous QHS  . insulin aspart  0-9 Units Subcutaneous TID WC  . levalbuterol  0.63 mg Nebulization TID  . mouth rinse  15 mL Mouth Rinse BID  . metoprolol tartrate  12.5 mg Oral BID  . mometasone-formoterol  2 puff Inhalation BID  .  multivitamin with minerals  1 tablet Oral Daily  . pantoprazole  40 mg Oral Daily  . polyethylene glycol  17 g Oral BID  . potassium chloride  20 mEq Oral BID  . senna-docusate  1 tablet Oral Daily  . sodium chloride flush  3 mL Intravenous Q12H  . tamsulosin  0.4 mg Oral QPC supper  . torsemide  40 mg Oral BID  . umeclidinium bromide  1 puff Inhalation Daily   Continuous Infusions: . sodium chloride     PRN Meds:.sodium chloride, acetaminophen **OR** acetaminophen, albuterol, HYDROcodone-acetaminophen, Melatonin, sodium chloride flush Allergies  Allergen Reactions  . Bactrim [Sulfamethoxazole-Trimethoprim] Other (See Comments)    "Allergic," per MAR  . Levaquin [Levofloxacin In D5w] Diarrhea and Other (See Comments)    "Allergic," per [facility]   Review of Systems loose stools, foley cath discussed, pain in right heel and sacrum, no sob.  Physical Exam  Well developed elderly appearing gentleman, very sharp mentally. CV rrr with systolic murmur Resp NAD Abdomen soft, nt, nd, +bs LE with minimal edema.     Vital Signs: BP (!) 131/57   Pulse 73   Temp 97.7 F (36.5 C) (Oral)   Resp (!) 22   Ht _0  (1.803 m)   Wt 78.1 kg   SpO2 100%   BMI 24.02 kg/m  Pain Scale: 0-10   Pain Score: 5    SpO2: SpO2: 100 % O2 Device:SpO2: 100 % O2 Flow Rate: .O2 Flow Rate (L/min): 2 L/min  IO: Intake/output summary:   Intake/Output Summary (Last 24 hours) at 02/20/2019 1321 Last data filed at 02/20/2019 1151 Gross per 24 hour  Intake 686 ml  Output 1976 ml  Net -1290 ml    LBM: Last BM Date: 02/20/19 Baseline Weight: Weight: 78.8 kg Most recent weight: Weight: 78.1 kg     Palliative Assessment/Data:40%     Time In: 3:00 Time Out: 4:00 Time Total: 60 min. Visit consisted of counseling and education dealing with the complex and emotionally intense issues surrounding the need for palliative care and symptom management in the setting of serious and potentially life-threatening  illness. Greater than 50%  of this time was spent counseling and coordinating care related to the above assessment and plan.  Signed by: Florentina Jenny, PA-C Palliative Medicine Pager: [phone]  Please contact Palliative Medicine Team phone at [phone] for questions and concerns.  For individual provider: See Shea Evans

## 2019-02-20 NOTE — Progress Notes (Signed)
Physical Therapy Treatment Patient Details Name: [name] MRN: [id number] DOB: 29-Jun-1931 Today's Date: 02/20/2019    History of Present Illness 85 yo male admitted to ED on 9/15 with CHF exacerbation, s/s including decreased urine output, weakness, weight gain, and LE edema. PMH includes 5 hospitalization in the past 6 months, afib, CHF, COPD on 3LO2 chronically, DMII, depression, gout, PVD, unstable angina, PE, TAVR 2019, bowel resection 2015.    PT Comments    Pt in bathroom on arrival wanting to be able to get up and move. Pt able to stand from toilet with rail but required assist to rise from chair without armrests. Pt with rapid fatigue walking from toilet to sink and needing to sit with SpO2 96% on 3L and HR 84. After seated recovery able to walk short distance without chair follow with education for self monitoring for activity tolerance and breathing technique. Pt requested return to bed despite encouragement to be in chair for meals.     Follow Up Recommendations  Supervision for mobility/OOB;Home health PT     Equipment Recommendations  None recommended by PT    Recommendations for Other Services       Precautions / Restrictions Precautions Precautions: Fall    Mobility  Bed Mobility   Bed Mobility: Sit to Supine     Supine to sit: Supervision     General bed mobility comments: pt able to return to supine with supervision for catheter with HOb 15 degrees, pt refused OOB to chair despite initial statement of wanting to eat in chair  Transfers Overall transfer level: Needs assistance Equipment used: Rolling walker (2 wheeled) Transfers: Sit to/from Stand Sit to Stand: Supervision;Mod assist         General transfer comment: pt able to stand from toilet on arrival without physical assist with use of rail with supervision. Pt fatigued quickly with need to immediately sit again after 5' with mod assist to rise from low chair without arms with cues for hands  on thighs and anterior translation  Ambulation/Gait Ambulation/Gait assistance: Min guard Gait Distance (Feet): 75 Feet Assistive device: Rolling walker (2 wheeled) Gait Pattern/deviations: Step-through pattern;Decreased stride length;Trunk flexed   Gait velocity interpretation: 1.31 - 2.62 ft/sec, indicative of limited community ambulator General Gait Details: guarding for safety with cues for proximity to RW as well as direction to room   Stairs Stairs: Yes Stairs assistance: Supervision Stair Management: Step to pattern;Sideways;One rail Right Number of Stairs: 3 General stair comments: pt able to perform stairs sideways with right rail without cues and good stability   Wheelchair Mobility    Modified Rankin (Stroke Patients Only)       Balance Overall balance assessment: Needs assistance Sitting-balance support: Feet supported;No upper extremity supported Sitting balance-Leahy Scale: Good     Standing balance support: Bilateral upper extremity supported;During functional activity Standing balance-Leahy Scale: Poor Standing balance comment: Reliant on bUE support                             Cognition Arousal/Alertness: Awake/alert Behavior During Therapy: WFL for tasks assessed/performed Overall Cognitive Status: Within Functional Limits for tasks assessed Area of Impairment: Safety/judgement                         Safety/Judgement: Decreased awareness of deficits     General Comments: pt with decreased awareness of fatigue and activity tolerance  Exercises      General Comments        Pertinent Vitals/Pain Pain Assessment: No/denies pain    Home Living                      Prior Function            PT Goals (current goals can now be found in the care plan section) Progress towards PT goals: Progressing toward goals    Frequency           PT Plan Current plan remains appropriate    Co-evaluation               AM-PAC PT "6 Clicks" Mobility   Outcome Measure  Help needed turning from your back to your side while in a flat bed without using bedrails?: None Help needed moving from lying on your back to sitting on the side of a flat bed without using bedrails?: None Help needed moving to and from a bed to a chair (including a wheelchair)?: A Little Help needed standing up from a chair using your arms (e.g., wheelchair or bedside chair)?: A Little Help needed to walk in hospital room?: A Little Help needed climbing 3-5 steps with a railing? : None 6 Click Score: 21    End of Session Equipment Utilized During Treatment: Gait belt;Oxygen Activity Tolerance: Patient limited by fatigue Patient left: with call bell/phone within reach;in bed;with bed alarm set Nurse Communication: Mobility status PT Visit Diagnosis: Muscle weakness (generalized) (M62.81);Difficulty in walking, not elsewhere classified (R26.2);Other abnormalities of gait and mobility (R26.89)     Time: 4128-2081 PT Time Calculation (min) (ACUTE ONLY): 18 min  Charges:  $Gait Training: 8-22 mins                     [place], PT Acute Rehabilitation Services Pager: [phone] Office: [place] 02/20/2019, 1:36 PM

## 2019-02-20 NOTE — Care Management Important Message (Signed)
Important Message  Patient Details  Name: FRANDY BASNETT MRN: [id number] Date of Birth: 05-13-1932   Medicare Important Message Given:  Yes     [name] 02/20/2019, 1:08 PM

## 2019-02-20 NOTE — Progress Notes (Signed)
PROGRESS NOTE    [name]  QQP:[id number] DOB: [birth year] DOA: 02/17/2019 PCP: Nicoletta Dress, MD  Brief Narrative: 85 year old chronically ill gentleman with CAD, CABG chronic diastolic CHF, severe pulmonary hypertension, history of TAVR, COPD/chronic respiratory failure on 3 L home O2, chronic urinary retention with indwelling Foley catheter, presented to the emergency room yesterday evening with increasing weakness decreased urine output weight gain and lower extremity edema, his wife called cardiology office and they were directed to come to the ED yesterday   Assessment & Plan:   Acute on chronic diastolic CHF, severe pulmonary hypertension -Only slightly volume overloaded, I doubt if weight is accurate, 174 pounds on admission, up from 166 at recent discharge -Clinically improving with diuresis, he is 2.2 L negative -Also on low-dose Aldactone which is continued -Appreciate cardiology input, diuretics changed to torsemide today, monitor urine output on this -Last echo with EF of 50 to 55%  -Palliative medicine consulted for goals of care, overall prognosis felt to be poor  Acute on chronic anemia -Anemia panel unremarkable, suggests chronic disease -Trace heme positive stools, no overt bleeding or blood loss,  -Appreciate GI input by Dr. Therisa Doyne, no indication for endoscopic evaluation felt at this time, recommended for conservative management, hemoglobin in the low 8 range, baseline is around 9 -May be a component of bone marrow suppression, unfortunately given advanced age and extensive list of comorbidities would not pursue aggressive work-up for this  Paroxysmal atrial fibrillation -In A. fib at this time, rate controlled, continue digoxin, low-dose metoprolol, continue Eliquis, monitor hemoglobin  Moderate to severe mitral stenosis with regurgitation -Not a surgical candidate  Hyponatremia -Likely due to volume overloaded state, improving with diuresis  CAD/CABG  -Stable, monitor  Severe aortic stenosis, recent TAVR  COPD/chronic respiratory failure -3 L home O2 at baseline -No wheezing at this time, nebs PRN  Type 2 diabetes mellitus -Continue sliding scale insulin, oral hypoglycemics on hold  History of urinary retention with chronic indwelling Foley catheter -Change Foley catheter today 9/18  DVT prophylaxis: Eliquis Code Status: Discussed with patient and spouse, per spouse he is DNR Family Communication: No family at bedside called and updated wife 9/17 and 9/16 Disposition Plan: Home likely in 1 to 2 days  Consultants:   Cardiology   Procedures:   Antimicrobials:    Subjective: -Reports that his catheter is not working properly,  Objective: Vitals:   02/19/19 2148 02/19/19 2152 02/20/19 0507 02/20/19 0900  BP:  (!) 163/71 (!) 122/50 (!) 131/57  Pulse:  77 77 73  Resp:  17 17 (!) 22  Temp:  (!) 97.4 F (36.3 C) (!) 97.4 F (36.3 C) 97.7 F (36.5 C)  TempSrc:  Oral Oral Oral  SpO2: 100% 100% 100% 100%  Weight:   78.1 kg   Height:        Intake/Output Summary (Last 24 hours) at 02/20/2019 1317 Last data filed at 02/20/2019 1151 Gross per 24 hour  Intake 686 ml  Output 1976 ml  Net -1290 ml   Filed Weights   02/18/19 0412 02/19/19 0047 02/20/19 0507  Weight: 78.5 kg 78.9 kg 78.1 kg    Examination:  Gen: Awake, Alert, Oriented X 3, elderly frail sitting up in bed, no distress HEENT: PERRLA, Neck supple, no JVD Lungs: Good air movement bilaterally, CTAB CVS: 1 S2/irregularly irregular Abd: soft, Non tender, non distended, BS present Extremities: Trace edema, small heel ulcer Skin: no new rashes Psychiatry:Mood & affect appropriate.     Data  Reviewed:   CBC: Recent Labs  Lab 02/17/19 1333 02/18/19 0428 02/19/19 0520 02/20/19 0529  WBC 7.4 7.3 6.7 7.1  HGB 8.7* 8.4* 8.0* 8.2*  HCT 26.9* 26.8* 24.4* 26.4*  MCV 84.9 84.5 83.6 86.0  PLT 261 308 256 915   Basic Metabolic Panel: Recent Labs   Lab 02/17/19 1333 02/18/19 0428 02/19/19 0520 02/20/19 0529  NA 128* 133* 131* 134*  K 4.0 4.4 4.4 4.8  CL 90* 95* 93* 97*  CO2 26 28 28 30   GLUCOSE 120* 122* 133* 136*  BUN 12 10 8 8   CREATININE 0.75 0.76 0.78 0.94  CALCIUM 8.8* 9.2 8.8* 9.2   GFR: Estimated Creatinine Clearance: 61.2 mL/min (by C-G formula based on SCr of 0.94 mg/dL). Liver Function Tests: Recent Labs  Lab 02/17/19 1333  AST 29  ALT 24  ALKPHOS 111  BILITOT 0.6  PROT 5.8*  ALBUMIN 3.3*   No results for input(s): LIPASE, AMYLASE in the last 168 hours. No results for input(s): AMMONIA in the last 168 hours. Coagulation Profile: No results for input(s): INR, PROTIME in the last 168 hours. Cardiac Enzymes: No results for input(s): CKTOTAL, CKMB, CKMBINDEX, TROPONINI in the last 168 hours. BNP (last 3 results) No results for input(s): PROBNP in the last 8760 hours. HbA1C: No results for input(s): HGBA1C in the last 72 hours. CBG: Recent Labs  Lab 02/19/19 1152 02/19/19 1747 02/19/19 2227 02/20/19 0659 02/20/19 1119  GLUCAP 134* 126* 143* 121* 131*   Lipid Profile: No results for input(s): CHOL, HDL, LDLCALC, TRIG, CHOLHDL, LDLDIRECT in the last 72 hours. Thyroid Function Tests: No results for input(s): TSH, T4TOTAL, FREET4, T3FREE, THYROIDAB in the last 72 hours. Anemia Panel: Recent Labs    02/18/19 0928  VITAMINB12 1,210*  FOLATE 21.5  FERRITIN 65  TIBC 328  IRON 95  RETICCTPCT 2.5   Urine analysis:    Component Value Date/Time   COLORURINE AMBER (A) 01/19/2019 1647   APPEARANCEUR CLOUDY (A) 01/19/2019 1647   LABSPEC 1.012 01/19/2019 1647   PHURINE 9.0 (H) 01/19/2019 1647   GLUCOSEU NEGATIVE 01/19/2019 1647   HGBUR MODERATE (A) 01/19/2019 1647   BILIRUBINUR NEGATIVE 01/19/2019 1647   KETONESUR NEGATIVE 01/19/2019 1647   PROTEINUR 100 (A) 01/19/2019 1647   UROBILINOGEN 0.2 12/23/2012 1026   NITRITE POSITIVE (A) 01/19/2019 1647   LEUKOCYTESUR MODERATE (A) 01/19/2019 1647    Sepsis Labs: @LABRCNTIP (procalcitonin:4,lacticidven:4)  ) Recent Results (from the past 240 hour(s))  SARS CORONAVIRUS 2 (TAT 6-24 HRS) Nasopharyngeal Nasopharyngeal Swab     Status: None   Collection Time: 02/17/19  4:05 PM   Specimen: Nasopharyngeal Swab  Result Value Ref Range Status   SARS Coronavirus 2 NEGATIVE NEGATIVE Final    Comment: (NOTE) SARS-CoV-2 target nucleic acids are NOT DETECTED. The SARS-CoV-2 RNA is generally detectable in upper and lower respiratory specimens during the acute phase of infection. Negative results do not preclude SARS-CoV-2 infection, do not rule out co-infections with other pathogens, and should not be used as the sole basis for treatment or other patient management decisions. Negative results must be combined with clinical observations, patient history, and epidemiological information. The expected result is Negative. Fact Sheet for Patients: SugarRoll.be Fact Sheet for Healthcare Providers: https://www.woods-mathews.com/ This test is not yet approved or cleared by the [place] FDA and  has been authorized for detection and/or diagnosis of SARS-CoV-2 by FDA under an Emergency Use Authorization (EUA). This EUA will remain  in effect (meaning this test can be used) for  the duration of the COVID-19 declaration under Section 56 4(b)(1) of the Act, 21 U.S.C. section 360bbb-3(b)(1), unless the authorization is terminated or revoked sooner. Performed at [facility], [place] [address] [place], [place] [address]          Radiology Studies: No results found.      Scheduled Meds: . apixaban  5 mg Oral BID  . atorvastatin  80 mg Oral QHS  . brimonidine  1 drop Both Eyes BID  . digoxin  0.125 mg Oral Daily  . ferrous sulfate  325 mg Oral Q breakfast  . insulin aspart  0-5 Units Subcutaneous QHS  . insulin aspart  0-9 Units Subcutaneous TID WC  . levalbuterol  0.63 mg Nebulization TID   . mouth rinse  15 mL Mouth Rinse BID  . metoprolol tartrate  12.5 mg Oral BID  . mometasone-formoterol  2 puff Inhalation BID  . multivitamin with minerals  1 tablet Oral Daily  . pantoprazole  40 mg Oral Daily  . polyethylene glycol  17 g Oral BID  . potassium chloride  20 mEq Oral BID  . senna-docusate  1 tablet Oral Daily  . sodium chloride flush  3 mL Intravenous Q12H  . tamsulosin  0.4 mg Oral QPC supper  . torsemide  40 mg Oral BID  . umeclidinium bromide  1 puff Inhalation Daily   Continuous Infusions: . sodium chloride       LOS: 3 days    Time spent: 51min    Domenic Polite, MD [facility]  02/20/2019, 1:17 PM

## 2019-02-21 DIAGNOSIS — I272 Pulmonary hypertension, unspecified: Secondary | ICD-10-CM

## 2019-02-21 DIAGNOSIS — I4811 Longstanding persistent atrial fibrillation: Secondary | ICD-10-CM

## 2019-02-21 LAB — BASIC METABOLIC PANEL
Anion gap: 10 (ref 5–15)
BUN: 10 mg/dL (ref 8–23)
CO2: 29 mmol/L (ref 22–32)
Calcium: 9.1 mg/dL (ref 8.9–10.3)
Chloride: 94 mmol/L — ABNORMAL LOW (ref 98–111)
Creatinine, Ser: 1.05 mg/dL (ref 0.61–1.24)
GFR calc Af Amer: >60 mL/min (ref >60)
GFR calc non Af Amer: >60 mL/min (ref >60)
Glucose, Bld: 169 mg/dL — ABNORMAL HIGH (ref 70–99)
Potassium: 4.4 mmol/L (ref 3.5–5.1)
Sodium: 133 mmol/L — ABNORMAL LOW (ref 135–145)

## 2019-02-21 LAB — GLUCOSE, CAPILLARY
Glucose-Capillary: 109 mg/dL — ABNORMAL HIGH (ref 70–99)
Glucose-Capillary: 165 mg/dL — ABNORMAL HIGH (ref 70–99)
Glucose-Capillary: 166 mg/dL — ABNORMAL HIGH (ref 70–99)
Glucose-Capillary: 198 mg/dL — ABNORMAL HIGH (ref 70–99)

## 2019-02-21 LAB — MAGNESIUM: Magnesium: 1.8 mg/dL (ref 1.7–2.4)

## 2019-02-21 MED ORDER — PREDNISONE 50 MG PO TABS
50.0000 mg | ORAL_TABLET | Freq: Every day | ORAL | Status: DC
  Administered 2019-02-21 – 2019-02-22 (×2): 50 mg via ORAL
  Filled 2019-02-21 (×3): qty 1

## 2019-02-21 MED ORDER — TORSEMIDE 20 MG PO TABS
40.0000 mg | ORAL_TABLET | Freq: Two times a day (BID) | ORAL | Status: DC
  Administered 2019-02-21 – 2019-02-22 (×3): 40 mg via ORAL
  Filled 2019-02-21 (×3): qty 2

## 2019-02-21 MED ORDER — METOPROLOL TARTRATE 25 MG PO TABS
25.0000 mg | ORAL_TABLET | Freq: Two times a day (BID) | ORAL | Status: DC
  Administered 2019-02-21 – 2019-02-22 (×2): 25 mg via ORAL
  Filled 2019-02-21 (×2): qty 1

## 2019-02-21 NOTE — Progress Notes (Signed)
PROGRESS NOTE    [name]  WUJ:[id number] DOB: [birth year] DOA: 02/17/2019 PCP: Nicoletta Dress, MD  Brief Narrative: 85 year old chronically ill gentleman with CAD, CABG chronic diastolic CHF, severe pulmonary hypertension, history of TAVR, COPD/chronic respiratory failure on 3 L home O2, chronic urinary retention with indwelling Foley catheter, presented to the emergency room yesterday evening with increasing weakness decreased urine output weight gain and lower extremity edema, his wife called cardiology office and they were directed to come to the ED.   Assessment & Plan:   Acute on chronic diastolic CHF, severe pulmonary hypertension -Only slightly volume overloaded, I doubt if weight is accurate, 174 pounds on admission, up from 166 at recent discharge -Clinically improving with diuresis, weight down 4 pounds now -Also on low-dose Aldactone which is continued -Appreciate cardiology input, continue torsemide -Last echo with EF of 50 to 55%  -Palliative medicine consulted for goals of care, overall prognosis felt to be poor, he will be followed by palliative care at discharge with the plan to transition to hospice down the road as needed  Acute on chronic anemia -Anemia panel unremarkable, suggests chronic disease -Trace heme positive stools, no overt bleeding or blood loss,  -Appreciate GI input by Dr. Therisa Doyne, no indication for endoscopic evaluation felt at this time, recommended for conservative management, hemoglobin in the low 8 range, baseline is around 9 -May be a component of bone marrow suppression, unfortunately given advanced age and extensive list of comorbidities would not pursue aggressive work-up for this  Right ankle gout flare -We will give prednisone 50 mg now and taper slowly, monitor CBGs  Paroxysmal atrial fibrillation -In A. fib at this time, rate controlled, continue digoxin, low-dose metoprolol, continue Eliquis, monitor hemoglobin -With non-sustained  V. tach now, metoprolol dose increased, check labs today  Moderate to severe mitral stenosis with regurgitation -Not a surgical candidate  Hyponatremia -Likely due to volume overloaded state, improving with diuresis  CAD/CABG -Stable, monitor  Severe aortic stenosis, recent TAVR  COPD/chronic respiratory failure -3 L home O2 at baseline -No wheezing at this time, nebs PRN  Type 2 diabetes mellitus -Continue sliding scale insulin, oral hypoglycemics on hold  History of urinary retention with chronic indwelling Foley catheter -Changed  Foley catheter9/18  DVT prophylaxis: Eliquis Code Status: Discussed with patient and spouse, per spouse he is DNR Family Communication: No family at bedside called and updated wife today Disposition Plan: Home tomorrow if stable  Consultants:   Cardiology   Procedures:   Antimicrobials:    Subjective: -Complains of right ankle gout flare  Objective: Vitals:   02/21/19 0209 02/21/19 0545 02/21/19 0714 02/21/19 0855  BP:  (!) 124/50  (!) 115/50  Pulse:  64  73  Resp:  16    Temp:  97.7 F (36.5 C)    TempSrc:  Oral    SpO2:  99% 98% 100%  Weight: 77.1 kg     Height:        Intake/Output Summary (Last 24 hours) at 02/21/2019 1108 Last data filed at 02/21/2019 0828 Gross per 24 hour  Intake 1703 ml  Output 1900 ml  Net -197 ml   Filed Weights   02/19/19 0047 02/20/19 0507 02/21/19 0209  Weight: 78.9 kg 78.1 kg 77.1 kg    Examination:  Gen: Awake, Alert, Oriented X 3, elderly frail sitting up in bed, no distress HEENT: PERRLA, Neck supple, no JVD Lungs: Good air movement bilaterally, CTAB CVS: S1-S2/irregularly irregular Abd: soft, Non tender, non distended,  BS present Extremities: Trace edema small heel ulcer Skin: no new rashes Psychiatry:Mood & affect appropriate.     Data Reviewed:   CBC: Recent Labs  Lab 02/17/19 1333 02/18/19 0428 02/19/19 0520 02/20/19 0529  WBC 7.4 7.3 6.7 7.1  HGB 8.7* 8.4* 8.0*  8.2*  HCT 26.9* 26.8* 24.4* 26.4*  MCV 84.9 84.5 83.6 86.0  PLT 261 308 256 101   Basic Metabolic Panel: Recent Labs  Lab 02/17/19 1333 02/18/19 0428 02/19/19 0520 02/20/19 0529  NA 128* 133* 131* 134*  K 4.0 4.4 4.4 4.8  CL 90* 95* 93* 97*  CO2 26 28 28 30   GLUCOSE 120* 122* 133* 136*  BUN 12 10 8 8   CREATININE 0.75 0.76 0.78 0.94  CALCIUM 8.8* 9.2 8.8* 9.2   GFR: Estimated Creatinine Clearance: 61.2 mL/min (by C-G formula based on SCr of 0.94 mg/dL). Liver Function Tests: Recent Labs  Lab 02/17/19 1333  AST 29  ALT 24  ALKPHOS 111  BILITOT 0.6  PROT 5.8*  ALBUMIN 3.3*   No results for input(s): LIPASE, AMYLASE in the last 168 hours. No results for input(s): AMMONIA in the last 168 hours. Coagulation Profile: No results for input(s): INR, PROTIME in the last 168 hours. Cardiac Enzymes: No results for input(s): CKTOTAL, CKMB, CKMBINDEX, TROPONINI in the last 168 hours. BNP (last 3 results) No results for input(s): PROBNP in the last 8760 hours. HbA1C: No results for input(s): HGBA1C in the last 72 hours. CBG: Recent Labs  Lab 02/20/19 0659 02/20/19 1119 02/20/19 1730 02/20/19 2128 02/21/19 0634  GLUCAP 121* 131* 139* 169* 109*   Lipid Profile: No results for input(s): CHOL, HDL, LDLCALC, TRIG, CHOLHDL, LDLDIRECT in the last 72 hours. Thyroid Function Tests: No results for input(s): TSH, T4TOTAL, FREET4, T3FREE, THYROIDAB in the last 72 hours. Anemia Panel: No results for input(s): VITAMINB12, FOLATE, FERRITIN, TIBC, IRON, RETICCTPCT in the last 72 hours. Urine analysis:    Component Value Date/Time   COLORURINE AMBER (A) 01/19/2019 1647   APPEARANCEUR CLOUDY (A) 01/19/2019 1647   LABSPEC 1.012 01/19/2019 1647   PHURINE 9.0 (H) 01/19/2019 1647   GLUCOSEU NEGATIVE 01/19/2019 1647   HGBUR MODERATE (A) 01/19/2019 1647   BILIRUBINUR NEGATIVE 01/19/2019 1647   KETONESUR NEGATIVE 01/19/2019 1647   PROTEINUR 100 (A) 01/19/2019 1647   UROBILINOGEN 0.2  12/23/2012 1026   NITRITE POSITIVE (A) 01/19/2019 1647   LEUKOCYTESUR MODERATE (A) 01/19/2019 1647   Sepsis Labs: @LABRCNTIP (procalcitonin:4,lacticidven:4)  ) Recent Results (from the past 240 hour(s))  SARS CORONAVIRUS 2 (TAT 6-24 HRS) Nasopharyngeal Nasopharyngeal Swab     Status: None   Collection Time: 02/17/19  4:05 PM   Specimen: Nasopharyngeal Swab  Result Value Ref Range Status   SARS Coronavirus 2 NEGATIVE NEGATIVE Final    Comment: (NOTE) SARS-CoV-2 target nucleic acids are NOT DETECTED. The SARS-CoV-2 RNA is generally detectable in upper and lower respiratory specimens during the acute phase of infection. Negative results do not preclude SARS-CoV-2 infection, do not rule out co-infections with other pathogens, and should not be used as the sole basis for treatment or other patient management decisions. Negative results must be combined with clinical observations, patient history, and epidemiological information. The expected result is Negative. Fact Sheet for Patients: SugarRoll.be Fact Sheet for Healthcare Providers: https://www.woods-mathews.com/ This test is not yet approved or cleared by the [place] FDA and  has been authorized for detection and/or diagnosis of SARS-CoV-2 by FDA under an Emergency Use Authorization (EUA). This EUA will remain  in effect (meaning this test can be used) for the duration of the COVID-19 declaration under Section 56 4(b)(1) of the Act, 21 U.S.C. section 360bbb-3(b)(1), unless the authorization is terminated or revoked sooner. Performed at [facility], [place] [address], [place], [place] [address]          Radiology Studies: No results found.      Scheduled Meds: . apixaban  5 mg Oral BID  . atorvastatin  80 mg Oral QHS  . brimonidine  1 drop Both Eyes BID  . digoxin  0.125 mg Oral Daily  . ferrous sulfate  325 mg Oral Q breakfast  . insulin aspart  0-5 Units  Subcutaneous QHS  . insulin aspart  0-9 Units Subcutaneous TID WC  . levalbuterol  0.63 mg Nebulization TID  . lidocaine  1 patch Transdermal Q24H  . mouth rinse  15 mL Mouth Rinse BID  . metoprolol tartrate  25 mg Oral BID  . mometasone-formoterol  2 puff Inhalation BID  . multivitamin with minerals  1 tablet Oral Daily  . pantoprazole  40 mg Oral Daily  . polyethylene glycol  17 g Oral BID  . potassium chloride  20 mEq Oral BID  . predniSONE  50 mg Oral Q breakfast  . senna-docusate  1 tablet Oral Daily  . sodium chloride flush  3 mL Intravenous Q12H  . tamsulosin  0.4 mg Oral QPC supper  . torsemide  40 mg Oral BID  . umeclidinium bromide  1 puff Inhalation Daily   Continuous Infusions: . sodium chloride       LOS: 4 days    Time spent: 55min    Domenic Polite, MD [facility]  02/21/2019, 11:08 AM

## 2019-02-21 NOTE — Progress Notes (Signed)
Progress Note  Patient Name: [name] Date of Encounter: 02/21/2019  Primary Cardiologist: Kirk Ruths, MD   Subjective   The patient is much more comfortable.   Inpatient Medications    Scheduled Meds: . apixaban  5 mg Oral BID  . atorvastatin  80 mg Oral QHS  . brimonidine  1 drop Both Eyes BID  . digoxin  0.125 mg Oral Daily  . ferrous sulfate  325 mg Oral Q breakfast  . insulin aspart  0-5 Units Subcutaneous QHS  . insulin aspart  0-9 Units Subcutaneous TID WC  . levalbuterol  0.63 mg Nebulization TID  . lidocaine  1 patch Transdermal Q24H  . mouth rinse  15 mL Mouth Rinse BID  . metoprolol tartrate  12.5 mg Oral BID  . mometasone-formoterol  2 puff Inhalation BID  . multivitamin with minerals  1 tablet Oral Daily  . pantoprazole  40 mg Oral Daily  . polyethylene glycol  17 g Oral BID  . potassium chloride  20 mEq Oral BID  . predniSONE  50 mg Oral Q breakfast  . senna-docusate  1 tablet Oral Daily  . sodium chloride flush  3 mL Intravenous Q12H  . tamsulosin  0.4 mg Oral QPC supper  . torsemide  40 mg Oral BID  . umeclidinium bromide  1 puff Inhalation Daily   Continuous Infusions: . sodium chloride     PRN Meds: sodium chloride, acetaminophen **OR** acetaminophen, albuterol, HYDROcodone-acetaminophen, Melatonin, sodium chloride flush   Vital Signs    Vitals:   02/21/19 0209 02/21/19 0545 02/21/19 0714 02/21/19 0855  BP:  (!) 124/50  (!) 115/50  Pulse:  64  73  Resp:  16    Temp:  97.7 F (36.5 C)    TempSrc:  Oral    SpO2:  99% 98% 100%  Weight: 77.1 kg     Height:        Intake/Output Summary (Last 24 hours) at 02/21/2019 0933 Last data filed at 02/21/2019 0828 Gross per 24 hour  Intake 1946 ml  Output 1900 ml  Net 46 ml   Last 3 Weights 02/21/2019 02/20/2019 02/19/2019  Weight (lbs) 169 lb 14.4 oz 172 lb 3.2 oz 174 lb  Weight (kg) 77.066 kg 78.109 kg 78.926 kg      Telemetry    Afib rate controlled, nsVT up to 30 beats-  Personally Reviewed  ECG    N/a - Personally Reviewed  Physical Exam  Frail older male. sleeping GEN: No acute distress.   Neck: No JVD Cardiac: Irreg Irreg, soft systolic murmur, no rubs, or gallops.  Respiratory: Clear to auscultation bilaterally. GI: Soft, nontender, non-distended  MS: No edema; No deformity. Neuro:  Nonfocal  Psych: Normal affect   Labs    High Sensitivity Troponin:   Recent Labs  Lab 02/17/19 1333  TROPONINIHS 25*      Chemistry Recent Labs  Lab 02/17/19 1333 02/18/19 0428 02/19/19 0520 02/20/19 0529  NA 128* 133* 131* 134*  K 4.0 4.4 4.4 4.8  CL 90* 95* 93* 97*  CO2 26 28 28 30   GLUCOSE 120* 122* 133* 136*  BUN 12 10 8 8   CREATININE 0.75 0.76 0.78 0.94  CALCIUM 8.8* 9.2 8.8* 9.2  PROT 5.8*  --   --   --   ALBUMIN 3.3*  --   --   --   AST 29  --   --   --   ALT 24  --   --   --  ALKPHOS 111  --   --   --   BILITOT 0.6  --   --   --   GFRNONAA >60 >60 >60 >60  GFRAA >60 >60 >60 >60  ANIONGAP 12 10 10 7      Hematology Recent Labs  Lab 02/18/19 0428 02/18/19 0928 02/19/19 0520 02/20/19 0529  WBC 7.3  --  6.7 7.1  RBC 3.17* 3.06* 2.92* 3.07*  HGB 8.4*  --  8.0* 8.2*  HCT 26.8*  --  24.4* 26.4*  MCV 84.5  --  83.6 86.0  MCH 26.5  --  27.4 26.7  MCHC 31.3  --  32.8 31.1  RDW 16.6*  --  16.6* 16.9*  PLT 308  --  256 283    BNP Recent Labs  Lab 02/17/19 1333  BNP 299.4*     DDimer No results for input(s): DDIMER in the last 168 hours.   Radiology    No results found.  Cardiac Studies   N/a   Patient Profile     85 y.o. male  with a hx of of CAD s/p CABG, permanent Afib, AS s/p TAVR, PVD, s/p right carotid endarterectomy in 2014, mitral stenosis and regurgitation, pleural effusions s/p multiple thoracentesis, chronic urinary retention with indwelling Foley catheterand COPD on home oxygen who was seen for the evaluation of CHF at the request of Dr. Broadus John.   Assessment & Plan    1. Acute on chronic diastolic  heart failure: BNP 299.  Chest x-ray with small bilateral pleural effusion.  Patient has a chronic indwelling catheter.  Presented with weakness, low urine output, abdominal fullness and weight gain. Certainly acute diastolic heart failure could be due to valvular heart disease or Foley. -I&O inaccurate but weight down 5 lbs in the last two days and he appears almost euvolemic -I will switch to torsemide 40 mg po BID, he was taking Lasix 80 mg twice daily prior to presentation. -  This could be continued at discharge with addition of metolazone as needed/scheduled once or twice/week or consider changing lasix to torsemide as alternative for better absorption.  - hold spironolactone - runs of nsVT - increase metoprolol to 25 mg po BID - digoxin level normal at 1.6  2.  Permanent atrial fibrillation: Rate controlled. -increaseLopressor to 25 mg twice daily, digoxin daily and Eliquis 5 mg twice daily.  3.  Acute on chronic anemia/positive stool guaiac: Hemoglobin 8.4>>8.0 today >> it was 10 on last admission 8/24.  -Conservative therapy recommended by GI per note -Per primary team  4.  Status post TAVR: function well  5.  Moderate to severe mitral stenosis with mild to moderate mitral regurgitation: Last echocardiogram July 2020 as above -Not a surgical candidate.  6.  CAD status post CABG: No angina -Continue current medical therapy  7.  Hyponatremia: stable. Follow BMET  8. DM: - Per primary team   9. DNR/DNI  - seen by palliative care  He can be discharged today or tomorrow.  For questions or updates, please contact [place] Please consult [email or web address] for contact info under   Signed, Ena Dawley, MD  02/21/2019, 9:33 AM

## 2019-02-21 NOTE — Consult Note (Signed)
[facility] Nurse wound follow up Patient consult received for right heel ulcer. My partner saw this patient for the right heel ulcer on Thursday and has provided guidance for nursing.  Prevalon boot is provided as the patient is complaining of pain.There is a prophylactic dressing to the sacrum and no skin injury there.  [facility] nursing team will not follow, but will remain available to this patient, the nursing and medical teams.  Please re-consult if needed. Thanks, Maudie Flakes, MSN, RN, [place], Arther Abbott  Pager# [phone]

## 2019-02-22 DIAGNOSIS — R531 Weakness: Secondary | ICD-10-CM

## 2019-02-22 LAB — GLUCOSE, CAPILLARY: Glucose-Capillary: 140 mg/dL — ABNORMAL HIGH (ref 70–99)

## 2019-02-22 MED ORDER — METOPROLOL TARTRATE 25 MG PO TABS: 25.0000 mg | ORAL_TABLET | Freq: Two times a day (BID) | ORAL | Status: AC

## 2019-02-22 MED ORDER — PREDNISONE 20 MG PO TABS: 20.0000 mg | ORAL_TABLET | Freq: Every day | ORAL | 0 refills | Status: AC

## 2019-02-22 MED ORDER — TORSEMIDE 20 MG PO TABS: 40.0000 mg | ORAL_TABLET | Freq: Two times a day (BID) | ORAL | 0 refills | Status: DC

## 2019-02-22 MED ORDER — FERROUS SULFATE 325 (65 FE) MG PO TABS: 325.0000 mg | ORAL_TABLET | Freq: Every day | ORAL | Status: AC

## 2019-02-22 NOTE — Care Management (Signed)
Notified [name] that patient has DC order for today and will resume home services. Resumption order placed. LM with with wife requesting callback.

## 2019-02-22 NOTE — Discharge Summary (Signed)
Physician Discharge Summary  [name] EXN:[id number] DOB: 1931/08/19 DOA: 02/17/2019  PCP: Nicoletta Dress, MD  Admit date: 02/17/2019 Discharge date: 02/22/2019  Time spent: 45 minutes  Recommendations for Outpatient Follow-up:  1. PCP in 1 week, please check Bmet/K at follow up 2. Palliative care follow-up to be continued through hosp[facility] of [facility]" with the goal to transition to hospice down the road  3.  Cardiology Dr. Stanford Breed in 2 weeks 4. Chronic Foley catheter, continue care   Discharge Diagnoses:  DO NOT RESUSCITATE Acute on chronic diastolic CHF Severe pulmonary hypertension Moderate to severe mitral stenosis with regurgitation   Essential hypertension, benign   Atrial fibrillation (HCC)   S/P TAVR (transcatheter aortic valve replacement)   Chronic hypoxemic respiratory failure (HCC)   COPD (chronic obstructive pulmonary disease) (HCC)   Acute on chronic diastolic heart failure (HCC)   Presence of indwelling Foley catheter   Congestive heart failure (CHF) (HCC)   Generalized weakness Chronic respiratory failure, on 3 L home O2 Gout   Discharge Condition: Improved  Diet recommendation: Diabetic, heart healthy  Filed Weights   02/20/19 0507 02/21/19 0209 02/22/19 0430  Weight: 78.1 kg 77.1 kg 75.6 kg    History of present illness:  85 year old chronically ill gentleman with CAD, CABG chronic diastolic CHF, severe pulmonary hypertension, history of TAVR, COPD/chronic respiratory failure on 3 L home O2, chronic urinary retention with indwelling Foley catheter, presented to the emergency room yesterday evening with increasing weakness decreased urine output weight gain and lower extremity edema, his wife called cardiology office and they were directed to come to the ED  Hospital Course:   Acute on chronic diastolic CHF, severe pulmonary hypertension -Was slightly volume overloaded on admission, with weight gain -He was diuresed with IV  Lasix clinically improved, weight down 8 pounds to 166.6 at discharge today -Followed by cardiology, transitioned to torsemide 40 mg twice daily -Also on low-dose Aldactone which is continued -Last echo with EF of 50 to 55%  -Palliative medicine consulted for goals of care, overall prognosis felt to be poor, he will be followed by palliative care at discharge with the plan to transition to hospice down the road as needed  Acute on chronic anemia -Anemia panel unremarkable, suggests chronic disease -Trace heme positive stools, no overt bleeding or blood loss,  -Appreciate GI input by Dr. Therisa Doyne, no indication for endoscopic evaluation felt at this time, recommended for conservative management, hemoglobin in the low 8 range, baseline is around 9 -May be a component of bone marrow suppression, unfortunately given advanced age and extensive list of comorbidities would not pursue aggressive work-up for this  Right ankle gout flare -Treated with few day course of prednisone, improving  Paroxysmal atrial fibrillation -In A. fib at this time, rate controlled, continue digoxin, low-dose metoprolol, continue Eliquis, monitor hemoglobin -With non-sustained V. tach, metoprolol dose increased  Moderate to severe mitral stenosis with regurgitation -Not a surgical candidate  Hyponatremia -Likely due to volume overloaded state, improving with diuresis  CAD/CABG -Stable, monitor  Severe aortic stenosis, recent TAVR  COPD/chronic respiratory failure -3 L home O2 at baseline -No wheezing at this time, nebs PRN  Type 2 diabetes mellitus -Home regimen of metformin resumed  History of urinary retention with chronic indwelling Foley catheter -Changed  Foley catheter9/18   Consultations:  Cardiology  Palliative medicine  Discharge Exam: Vitals:   02/22/19 0902 02/22/19 0923  BP: (!) 119/51   Pulse: 78   Resp:  Temp:    SpO2: 100% 97%    General: Alert awake oriented  x3 Cardiovascular: S1-S2/irregularly irregular Respiratory: Poor air movement bilaterally  Discharge Instructions   Discharge Instructions    Diet - low sodium heart healthy   Complete by: As directed    Diet Carb Modified   Complete by: As directed    Increase activity slowly   Complete by: As directed      Allergies as of 02/22/2019      Reactions   Bactrim [sulfamethoxazole-trimethoprim] Other (See Comments)   "Allergic," per [facility]   Levaquin [levofloxacin In D5w] Diarrhea, Other (See Comments)   "Allergic," per [facility]      Medication List    STOP taking these medications   fluconazole 150 MG tablet Commonly known as: DIFLUCAN   furosemide 80 MG tablet Commonly known as: LASIX   HYDROcodone-acetaminophen 5-325 MG tablet Commonly known as: NORCO/VICODIN   Potassium 99 MG Tabs     TAKE these medications   acyclovir 800 MG tablet Commonly known as: ZOVIRAX Take 400 mg by mouth 2 (two) times daily.   apixaban 5 MG Tabs tablet Commonly known as: ELIQUIS Take 1 tablet (5 mg total) by mouth 2 (two) times daily.   atorvastatin 80 MG tablet Commonly known as: LIPITOR Take 1 tablet (80 mg total) by mouth at bedtime.   brimonidine 0.2 % ophthalmic solution Commonly known as: ALPHAGAN Place 1 drop into both eyes 2 (two) times daily.   digoxin 0.125 MG tablet Commonly known as: LANOXIN Take 1 tablet (0.125 mg total) by mouth daily.   ferrous sulfate 325 (65 FE) MG tablet Take 1 tablet (325 mg total) by mouth daily with breakfast.   Foam Dressing Pads Apply 1 patch topically See admin instructions. Apply a foam dressing to sacrum every day AND as needed for redness   lansoprazole 30 MG capsule Commonly known as: PREVACID Take 60 mg by mouth daily at 12 noon.   levalbuterol 0.63 MG/3ML nebulizer solution Commonly known as: XOPENEX Take 3 mLs (0.63 mg total) by nebulization every 6 (six) hours as needed for wheezing or shortness of breath. What changed: when to  take this   Melatonin 5 MG Tabs Take 5 mg by mouth at bedtime.   metFORMIN 500 MG 24 hr tablet Commonly known as: GLUCOPHAGE-XR Take 500 mg by mouth at bedtime.   metoprolol tartrate 25 MG tablet Commonly known as: LOPRESSOR Take 1 tablet (25 mg total) by mouth 2 (two) times daily. What changed: how much to take   midodrine 5 MG tablet Commonly known as: PROAMATINE Take 5 mg by mouth 3 (three) times daily.   multivitamin with minerals Tabs tablet Take 1 tablet by mouth daily.   NON FORMULARY Take 120 mLs by mouth See admin instructions. MedPass: Drink 120 ml's by mouth two times a day   NovoLOG FlexPen 100 UNIT/ML FlexPen Generic drug: insulin aspart Inject 2 Units into the skin See admin instructions. Inject 2 units into the skin at 9 AM in the morning for a BGL >200   OXYGEN Inhale 3 L/min into the lungs continuous. TO MAINTAIN SATS <90%   polyethylene glycol 17 g packet Commonly known as: MIRALAX / GLYCOLAX Take 17 g by mouth 2 (two) times daily.   potassium chloride SA 15 MEQ tablet Commonly known as: KLOR-CON M15 Take 2 tablets (30 mEq total) by mouth 2 (two) times daily.   predniSONE 20 MG tablet Commonly known as: DELTASONE Take 1-2 tablets (20-40 mg  total) by mouth daily with breakfast for 4 days. TAke 40mg  daily for 2days then 20mg  daily for 2days   spironolactone 25 MG tablet Commonly known as: ALDACTONE Take 25 mg by mouth daily.   Symbicort 80-4.5 MCG/ACT inhaler Generic drug: budesonide-formoterol Inhale 1 puff into the lungs daily as needed ("for shortness of breath or wheezing- rinse mouth afterwards").   tamsulosin 0.4 MG Caps capsule Commonly known as: FLOMAX Take 1 capsule (0.4 mg total) by mouth daily after supper. What changed: when to take this   torsemide 20 MG tablet Commonly known as: DEMADEX Take 2 tablets (40 mg total) by mouth 2 (two) times daily.   trolamine salicylate 10 % cream Commonly known as: ASPERCREME Apply 1  application topically every 6 (six) hours as needed for muscle pain.   umeclidinium bromide 62.5 MCG/INH Aepb Commonly known as: INCRUSE ELLIPTA Inhale 1 puff into the lungs daily.   Vitamin D 50 MCG (2000 UT) tablet Take 1 tablet by mouth daily.      Allergies  Allergen Reactions  . Bactrim [Sulfamethoxazole-Trimethoprim] Other (See Comments)    "Allergic," per [facility]  . Levaquin [Levofloxacin In D5w] Diarrhea and Other (See Comments)    "Allergic," per [facility]   Follow-up Information    Lendon Colonel, NP. Go on 03/11/2019.   Specialties: Nurse Practitioner, Radiology, Cardiology Why: @3 :15pm for hospital follow up  Contact information: [place] [address] [place], [facility] Follow up.   Specialty: [place] Why: for home health services Contact information: [place] [address] [phone]            The results of significant diagnostics from this hospitalization (including imaging, microbiology, ancillary and laboratory) are listed below for reference.    Significant Diagnostic Studies: Dg Chest Port 1 View  Result Date: 02/17/2019 CLINICAL DATA:  85 year old male with shortness of breath. EXAM: PORTABLE CHEST 1 VIEW COMPARISON:  Chest radiograph dated 01/22/2019 FINDINGS: There is stable cardiomegaly. No vascular congestion or edema. Small bilateral pleural effusions similar or slightly decreased since the prior radiograph. There bibasilar hazy densities which may represent atelectasis or infiltrate. No pneumothorax. Median sternotomy wires, CABG vascular clips, and aortic valve repair. Atherosclerotic calcification of the aorta. No acute osseous pathology. IMPRESSION: 1. Small bilateral pleural effusions seen bibasilar atelectasis versus infiltrate. 2. Cardiomegaly. Electronically Signed   By: Anner Crete M.D.   On: 02/17/2019 13:32    Microbiology: Recent Results  (from the past 240 hour(s))  SARS CORONAVIRUS 2 (TAT 6-24 HRS) Nasopharyngeal Nasopharyngeal Swab     Status: None   Collection Time: 02/17/19  4:05 PM   Specimen: Nasopharyngeal Swab  Result Value Ref Range Status   SARS Coronavirus 2 NEGATIVE NEGATIVE Final    Comment: (NOTE) SARS-CoV-2 target nucleic acids are NOT DETECTED. The SARS-CoV-2 RNA is generally detectable in upper and lower respiratory specimens during the acute phase of infection. Negative results do not preclude SARS-CoV-2 infection, do not rule out co-infections with other pathogens, and should not be used as the sole basis for treatment or other patient management decisions. Negative results must be combined with clinical observations, patient history, and epidemiological information. The expected result is Negative. Fact Sheet for Patients: SugarRoll.be Fact Sheet for Healthcare Providers: https://www.woods-mathews.com/ This test is not yet approved or cleared by the [place] FDA and  has been authorized for detection and/or diagnosis of SARS-CoV-2 by FDA  under an Emergency Use Authorization (EUA). This EUA will remain  in effect (meaning this test can be used) for the duration of the COVID-19 declaration under Section 56 4(b)(1) of the Act, 21 U.S.C. section 360bbb-3(b)(1), unless the authorization is terminated or revoked sooner. Performed at [facility], [place] [address], [place], [place] [address]      Labs: Basic Metabolic Panel: Recent Labs  Lab 02/17/19 1333 02/18/19 0428 02/19/19 0520 02/20/19 0529 02/21/19 1124  NA 128* 133* 131* 134* 133*  K 4.0 4.4 4.4 4.8 4.4  CL 90* 95* 93* 97* 94*  CO2 26 28 28 30 29   GLUCOSE 120* 122* 133* 136* 169*  BUN 12 10 8 8 10   CREATININE 0.75 0.76 0.78 0.94 1.05  CALCIUM 8.8* 9.2 8.8* 9.2 9.1  MG  --   --   --   --  1.8   Liver Function Tests: Recent Labs  Lab 02/17/19 1333  AST 29  ALT 24  ALKPHOS 111   BILITOT 0.6  PROT 5.8*  ALBUMIN 3.3*   No results for input(s): LIPASE, AMYLASE in the last 168 hours. No results for input(s): AMMONIA in the last 168 hours. CBC: Recent Labs  Lab 02/17/19 1333 02/18/19 0428 02/19/19 0520 02/20/19 0529  WBC 7.4 7.3 6.7 7.1  HGB 8.7* 8.4* 8.0* 8.2*  HCT 26.9* 26.8* 24.4* 26.4*  MCV 84.9 84.5 83.6 86.0  PLT 261 308 256 283   Cardiac Enzymes: No results for input(s): CKTOTAL, CKMB, CKMBINDEX, TROPONINI in the last 168 hours. BNP: BNP (last 3 results) Recent Labs    12/31/18 1515 01/19/19 1408 02/17/19 1333  BNP 305.1* 222.4* 299.4*    ProBNP (last 3 results) No results for input(s): PROBNP in the last 8760 hours.  CBG: Recent Labs  Lab 02/21/19 0634 02/21/19 1139 02/21/19 1555 02/21/19 2104 02/22/19 0650  GLUCAP 109* 165* 166* 198* 140*       Signed:  Domenic Polite MD.  [facility] 02/22/2019, 2:07 PM

## 2019-02-22 NOTE — Progress Notes (Signed)
Patient woke up and was confused.  Originally stating he was "down in [place]". Patient kept claiming that he was tied down.  Patient does have Foley, oxygen, and tele box.  Staff assisted patient to chair.  Patient was able to correctly answer all orientation questions a few minutes later but still stated that he was "tied down" and "needed to go to the ground floor".  Patient insisted on calling his wife and RN also spoke with patient's wife.  She stated that this was not normal for him.  Patient is currently sitting in chair with chair alarm on.  RN notified [facility] NP.

## 2019-02-22 NOTE — Progress Notes (Signed)
Bodenheimer, NP returned page and stated to continue monitoring patient and it is okay to leave patient IV out due to slight agitation with extra attachments.

## 2019-02-23 ENCOUNTER — Other Ambulatory Visit: Payer: Self-pay

## 2019-02-23 DIAGNOSIS — J441 Chronic obstructive pulmonary disease with (acute) exacerbation: Secondary | ICD-10-CM | POA: Diagnosis not present

## 2019-02-23 DIAGNOSIS — I110 Hypertensive heart disease with heart failure: Secondary | ICD-10-CM | POA: Diagnosis not present

## 2019-02-23 DIAGNOSIS — Z466 Encounter for fitting and adjustment of urinary device: Secondary | ICD-10-CM | POA: Diagnosis not present

## 2019-02-23 DIAGNOSIS — E119 Type 2 diabetes mellitus without complications: Secondary | ICD-10-CM | POA: Diagnosis not present

## 2019-02-23 DIAGNOSIS — I5033 Acute on chronic diastolic (congestive) heart failure: Secondary | ICD-10-CM | POA: Diagnosis not present

## 2019-02-23 DIAGNOSIS — I2510 Atherosclerotic heart disease of native coronary artery without angina pectoris: Secondary | ICD-10-CM | POA: Diagnosis not present

## 2019-02-23 NOTE — Patient Outreach (Signed)
[facility]) Care Management  02/23/2019  [name] March 25, 1932 [id number]   Successful follow up call to patient's spouse today regarding referral submitted to [place] for construction of ramp. She did receive paperwork that was sent but has not had a chance to send it back.  Informed her that [facility] case is being closed due to patient involvement with [facility].    Ronn Melena, BSW Social Worker [phone]

## 2019-02-24 DIAGNOSIS — E119 Type 2 diabetes mellitus without complications: Secondary | ICD-10-CM | POA: Diagnosis not present

## 2019-02-24 DIAGNOSIS — Z466 Encounter for fitting and adjustment of urinary device: Secondary | ICD-10-CM | POA: Diagnosis not present

## 2019-02-24 DIAGNOSIS — J441 Chronic obstructive pulmonary disease with (acute) exacerbation: Secondary | ICD-10-CM | POA: Diagnosis not present

## 2019-02-24 DIAGNOSIS — I5033 Acute on chronic diastolic (congestive) heart failure: Secondary | ICD-10-CM | POA: Diagnosis not present

## 2019-02-24 DIAGNOSIS — I110 Hypertensive heart disease with heart failure: Secondary | ICD-10-CM | POA: Diagnosis not present

## 2019-02-24 DIAGNOSIS — I2510 Atherosclerotic heart disease of native coronary artery without angina pectoris: Secondary | ICD-10-CM | POA: Diagnosis not present

## 2019-02-26 DIAGNOSIS — I5033 Acute on chronic diastolic (congestive) heart failure: Secondary | ICD-10-CM | POA: Diagnosis not present

## 2019-02-26 DIAGNOSIS — I2510 Atherosclerotic heart disease of native coronary artery without angina pectoris: Secondary | ICD-10-CM | POA: Diagnosis not present

## 2019-02-26 DIAGNOSIS — E119 Type 2 diabetes mellitus without complications: Secondary | ICD-10-CM | POA: Diagnosis not present

## 2019-02-26 DIAGNOSIS — J441 Chronic obstructive pulmonary disease with (acute) exacerbation: Secondary | ICD-10-CM | POA: Diagnosis not present

## 2019-02-26 DIAGNOSIS — I110 Hypertensive heart disease with heart failure: Secondary | ICD-10-CM | POA: Diagnosis not present

## 2019-02-26 DIAGNOSIS — Z466 Encounter for fitting and adjustment of urinary device: Secondary | ICD-10-CM | POA: Diagnosis not present

## 2019-02-27 DIAGNOSIS — N401 Enlarged prostate with lower urinary tract symptoms: Secondary | ICD-10-CM | POA: Diagnosis not present

## 2019-02-27 DIAGNOSIS — R339 Retention of urine, unspecified: Secondary | ICD-10-CM | POA: Diagnosis not present

## 2019-03-02 DIAGNOSIS — I5033 Acute on chronic diastolic (congestive) heart failure: Secondary | ICD-10-CM | POA: Diagnosis not present

## 2019-03-02 DIAGNOSIS — E119 Type 2 diabetes mellitus without complications: Secondary | ICD-10-CM | POA: Diagnosis not present

## 2019-03-02 DIAGNOSIS — Z466 Encounter for fitting and adjustment of urinary device: Secondary | ICD-10-CM | POA: Diagnosis not present

## 2019-03-02 DIAGNOSIS — I110 Hypertensive heart disease with heart failure: Secondary | ICD-10-CM | POA: Diagnosis not present

## 2019-03-02 DIAGNOSIS — J441 Chronic obstructive pulmonary disease with (acute) exacerbation: Secondary | ICD-10-CM | POA: Diagnosis not present

## 2019-03-02 DIAGNOSIS — I2510 Atherosclerotic heart disease of native coronary artery without angina pectoris: Secondary | ICD-10-CM | POA: Diagnosis not present

## 2019-03-03 DIAGNOSIS — Z466 Encounter for fitting and adjustment of urinary device: Secondary | ICD-10-CM | POA: Diagnosis not present

## 2019-03-03 DIAGNOSIS — I2510 Atherosclerotic heart disease of native coronary artery without angina pectoris: Secondary | ICD-10-CM | POA: Diagnosis not present

## 2019-03-03 DIAGNOSIS — I5033 Acute on chronic diastolic (congestive) heart failure: Secondary | ICD-10-CM | POA: Diagnosis not present

## 2019-03-03 DIAGNOSIS — J441 Chronic obstructive pulmonary disease with (acute) exacerbation: Secondary | ICD-10-CM | POA: Diagnosis not present

## 2019-03-03 DIAGNOSIS — E119 Type 2 diabetes mellitus without complications: Secondary | ICD-10-CM | POA: Diagnosis not present

## 2019-03-03 DIAGNOSIS — I110 Hypertensive heart disease with heart failure: Secondary | ICD-10-CM | POA: Diagnosis not present

## 2019-03-04 DIAGNOSIS — R195 Other fecal abnormalities: Secondary | ICD-10-CM | POA: Diagnosis not present

## 2019-03-04 DIAGNOSIS — R338 Other retention of urine: Secondary | ICD-10-CM | POA: Diagnosis not present

## 2019-03-04 DIAGNOSIS — D500 Iron deficiency anemia secondary to blood loss (chronic): Secondary | ICD-10-CM | POA: Diagnosis not present

## 2019-03-04 DIAGNOSIS — I5033 Acute on chronic diastolic (congestive) heart failure: Secondary | ICD-10-CM | POA: Diagnosis not present

## 2019-03-05 ENCOUNTER — Other Ambulatory Visit (HOSPITAL_COMMUNITY): Payer: Medicare Other

## 2019-03-05 DIAGNOSIS — J441 Chronic obstructive pulmonary disease with (acute) exacerbation: Secondary | ICD-10-CM | POA: Diagnosis not present

## 2019-03-05 DIAGNOSIS — I110 Hypertensive heart disease with heart failure: Secondary | ICD-10-CM | POA: Diagnosis not present

## 2019-03-05 DIAGNOSIS — I2510 Atherosclerotic heart disease of native coronary artery without angina pectoris: Secondary | ICD-10-CM | POA: Diagnosis not present

## 2019-03-05 DIAGNOSIS — E119 Type 2 diabetes mellitus without complications: Secondary | ICD-10-CM | POA: Diagnosis not present

## 2019-03-05 DIAGNOSIS — Z466 Encounter for fitting and adjustment of urinary device: Secondary | ICD-10-CM | POA: Diagnosis not present

## 2019-03-05 DIAGNOSIS — I5033 Acute on chronic diastolic (congestive) heart failure: Secondary | ICD-10-CM | POA: Diagnosis not present

## 2019-03-10 DIAGNOSIS — I252 Old myocardial infarction: Secondary | ICD-10-CM | POA: Diagnosis not present

## 2019-03-10 DIAGNOSIS — I25110 Atherosclerotic heart disease of native coronary artery with unstable angina pectoris: Secondary | ICD-10-CM | POA: Diagnosis not present

## 2019-03-10 DIAGNOSIS — M1990 Unspecified osteoarthritis, unspecified site: Secondary | ICD-10-CM | POA: Diagnosis not present

## 2019-03-10 DIAGNOSIS — M4854XD Collapsed vertebra, not elsewhere classified, thoracic region, subsequent encounter for fracture with routine healing: Secondary | ICD-10-CM | POA: Diagnosis not present

## 2019-03-10 DIAGNOSIS — J439 Emphysema, unspecified: Secondary | ICD-10-CM | POA: Diagnosis not present

## 2019-03-10 DIAGNOSIS — M545 Low back pain: Secondary | ICD-10-CM | POA: Diagnosis not present

## 2019-03-10 DIAGNOSIS — H409 Unspecified glaucoma: Secondary | ICD-10-CM | POA: Diagnosis not present

## 2019-03-10 DIAGNOSIS — N138 Other obstructive and reflux uropathy: Secondary | ICD-10-CM | POA: Diagnosis not present

## 2019-03-10 DIAGNOSIS — D649 Anemia, unspecified: Secondary | ICD-10-CM | POA: Diagnosis not present

## 2019-03-10 DIAGNOSIS — G8929 Other chronic pain: Secondary | ICD-10-CM | POA: Diagnosis not present

## 2019-03-10 DIAGNOSIS — E1151 Type 2 diabetes mellitus with diabetic peripheral angiopathy without gangrene: Secondary | ICD-10-CM | POA: Diagnosis not present

## 2019-03-10 DIAGNOSIS — I110 Hypertensive heart disease with heart failure: Secondary | ICD-10-CM | POA: Diagnosis not present

## 2019-03-10 DIAGNOSIS — E871 Hypo-osmolality and hyponatremia: Secondary | ICD-10-CM | POA: Diagnosis not present

## 2019-03-10 DIAGNOSIS — M810 Age-related osteoporosis without current pathological fracture: Secondary | ICD-10-CM | POA: Diagnosis not present

## 2019-03-10 DIAGNOSIS — J9690 Respiratory failure, unspecified, unspecified whether with hypoxia or hypercapnia: Secondary | ICD-10-CM | POA: Diagnosis not present

## 2019-03-10 DIAGNOSIS — I5033 Acute on chronic diastolic (congestive) heart failure: Secondary | ICD-10-CM | POA: Diagnosis not present

## 2019-03-10 DIAGNOSIS — M109 Gout, unspecified: Secondary | ICD-10-CM | POA: Diagnosis not present

## 2019-03-10 DIAGNOSIS — N401 Enlarged prostate with lower urinary tract symptoms: Secondary | ICD-10-CM | POA: Diagnosis not present

## 2019-03-10 DIAGNOSIS — I4821 Permanent atrial fibrillation: Secondary | ICD-10-CM | POA: Diagnosis not present

## 2019-03-10 DIAGNOSIS — R338 Other retention of urine: Secondary | ICD-10-CM | POA: Diagnosis not present

## 2019-03-10 DIAGNOSIS — H8110 Benign paroxysmal vertigo, unspecified ear: Secondary | ICD-10-CM | POA: Diagnosis not present

## 2019-03-10 DIAGNOSIS — E785 Hyperlipidemia, unspecified: Secondary | ICD-10-CM | POA: Diagnosis not present

## 2019-03-10 DIAGNOSIS — I700 Atherosclerosis of aorta: Secondary | ICD-10-CM | POA: Diagnosis not present

## 2019-03-10 DIAGNOSIS — I959 Hypotension, unspecified: Secondary | ICD-10-CM | POA: Diagnosis not present

## 2019-03-10 DIAGNOSIS — E43 Unspecified severe protein-calorie malnutrition: Secondary | ICD-10-CM | POA: Diagnosis not present

## 2019-03-11 ENCOUNTER — Other Ambulatory Visit: Payer: Self-pay

## 2019-03-11 ENCOUNTER — Ambulatory Visit (INDEPENDENT_AMBULATORY_CARE_PROVIDER_SITE_OTHER): Payer: Medicare Other | Admitting: Adult Health

## 2019-03-11 ENCOUNTER — Encounter: Payer: Self-pay | Admitting: Adult Health

## 2019-03-11 VITALS — BP 106/58 | HR 84 | Temp 96.8°F | Ht 71.0 in | Wt 165.8 lb

## 2019-03-11 DIAGNOSIS — I6523 Occlusion and stenosis of bilateral carotid arteries: Secondary | ICD-10-CM

## 2019-03-11 DIAGNOSIS — I5042 Chronic combined systolic (congestive) and diastolic (congestive) heart failure: Secondary | ICD-10-CM | POA: Diagnosis not present

## 2019-03-11 DIAGNOSIS — Z952 Presence of prosthetic heart valve: Secondary | ICD-10-CM | POA: Diagnosis not present

## 2019-03-11 DIAGNOSIS — I5031 Acute diastolic (congestive) heart failure: Secondary | ICD-10-CM

## 2019-03-11 DIAGNOSIS — I4811 Longstanding persistent atrial fibrillation: Secondary | ICD-10-CM | POA: Diagnosis not present

## 2019-03-11 DIAGNOSIS — I2510 Atherosclerotic heart disease of native coronary artery without angina pectoris: Secondary | ICD-10-CM | POA: Diagnosis not present

## 2019-03-11 DIAGNOSIS — J90 Pleural effusion, not elsewhere classified: Secondary | ICD-10-CM

## 2019-03-11 NOTE — Patient Instructions (Signed)
Medication Instructions:  Continue current medications  If you need a refill on your cardiac medications before your next appointment, please call your pharmacy.  Labwork: None Ordered   Testing/Procedures: None Ordered  Follow-Up: . Your physician recommends that you schedule a follow-up appointment in: Keep appointment with Dr Stanford Breed December 17th    At [facility], you and your health needs are our priority.  As part of our continuing mission to provide you with exceptional heart care, we have created designated Provider Care Teams.  These Care Teams include your primary Cardiologist (physician) and Advanced Practice Providers (APPs -  Physician Assistants and Nurse Practitioners) who all work together to provide you with the care you need, when you need it.  Thank you for choosing [facility] at [facility]!!

## 2019-03-11 NOTE — Progress Notes (Signed)
Cardiology Office Note   Date:  03/11/2019   ID:  [name], DOB 1932/03/16, MRN [id number]  PCP:  Nicoletta Dress, MD  Cardiologist: Dr. Stanford Breed CC: Hospital Follow Up   History of Present Illness: [name] is a 85 y.o. male who presents for posthospitalization follow-up after admission for mixed decompensated CHF and seen on consultation by cardiology.  He has a history of coronary artery disease status post CABG, permanent atrial fibrillation, aortic stenosis status post TAVR, carotid artery disease status post right carotid endarterectomy in 2014, PVD, mitral stenosis and regurgitation, (not a surgical candidate), pleural effusion status post multiple thoracentesis, chronic urinary retention and now has an indwelling Foley catheter, COPD on home oxygen, at 3 L.  During hospitalization the patient was diuresed and then transitioned to torsemide 40 mg p.o. twice daily, (had been on Lasix 80 mg twice daily).  Addition of metolazone PRN could be discussed to take once or twice a week on follow up, spironolactone was discontinued.  Creatinine on day of discharge 1.05.    During hospitalization the patient had runs of nonsustained ventricular tachycardia and therefore metoprolol was increased to 25 mg p.o. twice daily.  He was continued on Eliquis 5 mg twice daily for atrial fib.  He also was found to have chronic anemia with positive stool guaiac with hemoglobin of 8.4.  GI recommended conservative therapy only.  The patient has been documented as a DNR.  He comes today with his wife who is a retired Therapist, sports.  She has multiple complaints about his time in skilled nursing facility.  She is now taking care of him at home.  She weighs him daily, takes his vital signs, she also provides him with his medications.  The patient also had a hematoma on the left great toe which is now being treated with Epson salts and is gotten softer and has begun to drain.  He is followed closely by urology and is  due to have his Foley catheter changed in approximately 1 month.  His primary care physician has not received a copy of the discharge summary from his recent hospitalization they are requesting that it be sent to them the possible.  He is also wishing to cancel carotid artery Doppler study.  They keep receiving letters to have this completed.  The patient states he is feeling much better, breathing better, has no problems with fluid retention.  He is on a low-sodium diet that his wife is maintaining very strictly.  He denies any dizziness.  He denies any chest discomfort or myalgia pain.  The patient denies any rapid heart rhythm, bleeding on Eliquis.  He is due to follow-up with his primary care physician for labs in the next couple of weeks.   Past Medical History:  Diagnosis Date   Acute myocardial infarction, unspecified site, initial episode of care    Anemia    Anxiety    Aortic stenosis     Atrial fibrillation 06/27/2013   Atrial fibrillation (HCC)    a. permanent, on Coumadin for anticoagulation   BPPV (benign paroxysmal positional vertigo) 2015   CAD (coronary artery disease)    a. s/p CABG in 1994 b. cath in 2015 showing normal LM, 100% LCx and RCA stenosis with 50-60% prox LAD stenosis and 100% mid-LAD stenosis; patent sequential SVG-OM2-OM3 and patent LIMA-LAD with PTCA of distal LAD via LIMA graft performed at that time   Carotid stenosis    CAROTID STENOSIS- moderate 09/09/2008  Qualifier: Diagnosis of  By: Burnett Kanaris     Cellulitis 11/15/2018   Chest pain with moderate risk of acute coronary syndrome 06/27/2013   CHF (congestive heart failure) (HCC)    Chronic anticoagulation 06/27/2013   COPD (chronic obstructive pulmonary disease) ([place])    Depression    Diabetes mellitus without complication ([place])    FASTING 98-120S   GERD (gastroesophageal reflux disease)    Gout attack- on steroid dose pack 06/27/2013   History of blood transfusion    History  of peptic ulcer disease    Hyperlipidemia    Hypertension    Left atrial severely dilated 01/19/2019   Myocardial infarction ([place])    X2   NSTEMI (non-ST elevated myocardial infarction) ([place]) 08/2013   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 ([place]) 06/04/2013   PEPTIC ULCER DISEASE, HX OF 09/09/2008   Qualifier: Diagnosis of  By: Burnett Kanaris     PERIPHERAL VASCULAR DISEASE 09/09/2008   Qualifier: Diagnosis of  By: Burnett Kanaris     Peripheral vascular disease ([place])    Pneumonia    HX OF   Presence of indwelling Foley catheter 01/20/2019   Pressure injury of skin 12/17/2018   Renal artery stenosis (HCC)    S/P thoracentesis    Severe aortic stenosis 09/17/2017   Sleep apnea    OXYGEN AT NIGHT 2L [place]   Stress fracture of calcaneus 2018   Dr Paulla Dolly (Pod)   Tachycardia- beta blocker increased 06/26/2013   UNSPECIFIED ANEMIA 09/09/2008   Qualifier: Diagnosis of  By: Burnett Kanaris     UNSPECIFIED CEREBROVASCULAR DISEASE 09/09/2008   Qualifier: Diagnosis of  By: Burnett Kanaris     Unstable angina ([place]) 08/31/2013   Vertigo 06/27/2013    Past Surgical History:  Procedure Laterality Date   AORTIC ARCH ANGIOGRAPHY N/A 08/14/2017   Procedure: AORTIC ARCH ANGIOGRAPHY;  Surgeon: Sherren Mocha, MD;  Location: [facility];  Service: Cardiovascular;  Laterality: N/A;   BOWEL RESECTION  07/2013   Bowel perforation   CARDIAC CATHETERIZATION     CAROTID ENDARTERECTOMY Right 2014   Curt Jews, MD   COLON SURGERY     CORONARY ARTERY BYPASS GRAFT  1994   ENDARTERECTOMY Right 01/05/2013   Procedure: ENDARTERECTOMY CAROTID;  Surgeon: Rosetta Posner, MD;  Location: [facility];  Service: Vascular;  Laterality: Right;   INCISION AND DRAINAGE Left 11/16/2018   Procedure: INCISION AND DRAINAGE LEFT FOREARM/ARM;  Surgeon: Leanora Cover, MD;  Location: [facility];  Service: Orthopedics;  Laterality: Left;   KNEE SURGERY  08/2003    left knee/ARTHROSCOPIC   LEFT HEART CATHETERIZATION WITH CORONARY/GRAFT ANGIOGRAM N/A 09/01/2013   Procedure: LEFT HEART CATHETERIZATION WITH Beatrix Fetters;  Surgeon: Sinclair Grooms, MD;  Location: [facility];  Service: Cardiovascular;  Laterality: N/A;   PATCH ANGIOPLASTY Right 01/05/2013   Procedure: PATCH ANGIOPLASTY;  Surgeon: Rosetta Posner, MD;  Location: [facility];  Service: Vascular;  Laterality: Right;   PERCUTANEOUS CORONARY STENT INTERVENTION (PCI-S)  09/01/2013   Procedure: PERCUTANEOUS CORONARY STENT INTERVENTION (PCI-S);  Surgeon: Sinclair Grooms, MD;  Location: [facility] CATH [facility];  Service: Cardiovascular;;   removal of bleeding ulcer  1994   RENAL ARTERY STENT     stenting of the left renal artery as well as a cutting balloon angioplasty for treatment of in-stent restenosis coronary artery bypass grafting in 1994.   RIGHT/LEFT HEART CATH AND CORONARY/GRAFT ANGIOGRAPHY N/A 08/14/2017   Procedure: RIGHT/LEFT  HEART CATH AND CORONARY/GRAFT ANGIOGRAPHY;  Surgeon: Sherren Mocha, MD;  Location: [facility];  Service: Cardiovascular;  Laterality: N/A;   TEE WITHOUT CARDIOVERSION N/A 09/17/2017   Procedure: TRANSESOPHAGEAL ECHOCARDIOGRAM (TEE);  Surgeon: Sherren Mocha, MD;  Location: [facility];  Service: Open Heart Surgery;  Laterality: N/A;     Current Outpatient Medications  Medication Sig Dispense Refill   acyclovir (ZOVIRAX) 800 MG tablet Take 400 mg by mouth 2 (two) times daily.     atorvastatin (LIPITOR) 80 MG tablet Take 1 tablet (80 mg total) by mouth at bedtime. 90 tablet 3   brimonidine (ALPHAGAN) 0.2 % ophthalmic solution Place 1 drop into both eyes 2 (two) times daily.     budesonide-formoterol (SYMBICORT) 80-4.5 MCG/ACT inhaler Inhale 1 puff into the lungs daily as needed ("for shortness of breath or wheezing- rinse mouth afterwards").      Cholecalciferol (VITAMIN D) 50 MCG (2000 UT) tablet Take 1 tablet by mouth daily.     digoxin (LANOXIN) 0.125 MG  tablet Take 1 tablet (0.125 mg total) by mouth daily.     ferrous sulfate 325 (65 FE) MG tablet Take 1 tablet (325 mg total) by mouth daily with breakfast.     insulin aspart (NOVOLOG FLEXPEN) 100 UNIT/ML FlexPen Inject 2 Units into the skin See admin instructions. Inject 2 units into the skin at 9 AM in the morning for a BGL >200     lansoprazole (PREVACID) 30 MG capsule Take 60 mg by mouth daily at 12 noon.      levalbuterol (XOPENEX) 0.63 MG/3ML nebulizer solution Take 3 mLs (0.63 mg total) by nebulization every 6 (six) hours as needed for wheezing or shortness of breath. (Patient taking differently: Take 0.63 mg by nebulization 3 (three) times daily. ) 3 mL 12   Melatonin 5 MG TABS Take 5 mg by mouth at bedtime.     metFORMIN (GLUCOPHAGE-XR) 500 MG 24 hr tablet Take 500 mg by mouth at bedtime.      metoprolol tartrate (LOPRESSOR) 25 MG tablet Take 1 tablet (25 mg total) by mouth 2 (two) times daily.     midodrine (PROAMATINE) 5 MG tablet Take 5 mg by mouth 3 (three) times daily.     Multiple Vitamin (MULTIVITAMIN WITH MINERALS) TABS Take 1 tablet by mouth daily.     NON FORMULARY Take 120 mLs by mouth See admin instructions. MedPass: Drink 120 ml's by mouth two times a day     OXYGEN Inhale 3 L/min into the lungs continuous. TO MAINTAIN SATS <90%     polyethylene glycol (MIRALAX / GLYCOLAX) 17 g packet Take 17 g by mouth 2 (two) times daily. 14 each 0   potassium chloride (KLOR-CON M15) 15 MEQ tablet Take 2 tablets (30 mEq total) by mouth 2 (two) times daily.     spironolactone (ALDACTONE) 25 MG tablet Take 25 mg by mouth daily.     tamsulosin (FLOMAX) 0.4 MG CAPS capsule Take 1 capsule (0.4 mg total) by mouth daily after supper. (Patient taking differently: Take 0.4 mg by mouth every evening. ) 30 capsule    torsemide (DEMADEX) 20 MG tablet Take 2 tablets (40 mg total) by mouth 2 (two) times daily. 60 tablet 0   trolamine salicylate (ASPERCREME) 10 % cream Apply 1 application  topically every 6 (six) hours as needed for muscle pain.      umeclidinium bromide (INCRUSE ELLIPTA) 62.5 MCG/INH AEPB Inhale 1 puff into the lungs daily. 1 each 0   apixaban (  ELIQUIS) 5 MG TABS tablet Take 1 tablet (5 mg total) by mouth 2 (two) times daily. 60 tablet    Gauze Pads & Dressings (FOAM DRESSING) PADS Apply 1 patch topically See admin instructions. Apply a foam dressing to sacrum every day AND as needed for redness     No current facility-administered medications for this visit.     Allergies:   Bactrim [sulfamethoxazole-trimethoprim] and Levaquin [levofloxacin in d5w]    Social History:  The patient  reports that he quit smoking about 25 years ago. He has never used smokeless tobacco. He reports that he does not drink alcohol or use drugs.   Family History:  The patient's family history includes Coronary artery disease in his father, mother, and another family member; Heart attack in his father and mother; Heart disease in his brother, father, mother, sister, and another family member; Hyperlipidemia in his mother; Hypertension in his mother.    ROS: All other systems are reviewed and negative. Unless otherwise mentioned in H&P    PHYSICAL EXAM: VS:  BP (!) 106/58    Pulse 84    Temp (!) 96.8 F (36 C)    Ht 5\' 11"  (1.803 m)    Wt 165 lb 12.8 oz (75.2 kg)    SpO2 100%    BMI 23.12 kg/m  , BMI Body mass index is 23.12 kg/m. GEN: Well nourished, well developed, in no acute distress HEENT: normal Neck: no JVD, carotid bruits, or masses Cardiac: IRRR; 1/6 systolic murmurs, rubs, or gallops,no edema  Respiratory:  Clear to auscultation bilaterally, normal work of breathing, wearing oxygen via nasal cannula at 3 L. GI: soft, nontender, nondistended, + BS MS: no deformity or atrophy.  Hematoma is noted at the tip of the left great toe.  No significant draining.  There is no erythema.  It is soft. Skin: warm and dry, no rash, pale Neuro:  Strength and sensation are  intact Psych: euthymic mood, full affect GU: Foley catheter in place with drainage bag.  Cloudy urine noted in the tubing.  EKG: ATRIAL fibrillation heart rate of 68 bpm with T wave abnormality leads II, III and aVF, with T wave inversion noted V5 and V6.  Recent Labs: 01/20/2019: TSH 2.424 02/17/2019: ALT 24; B Natriuretic Peptide 299.4 02/20/2019: Hemoglobin 8.2; Platelets 283 02/21/2019: BUN 10; Creatinine, Ser 1.05; Magnesium 1.8; Potassium 4.4; Sodium 133    Lipid Panel    Component Value Date/Time   CHOL 142 11/17/2012 0909   TRIG 90.0 11/17/2012 0909   HDL 46.80 11/17/2012 0909   CHOLHDL 3 11/17/2012 0909   VLDL 18.0 11/17/2012 0909   LDLCALC 77 11/17/2012 0909   LDLDIRECT 67.6 09/08/2009 0000      Wt Readings from Last 3 Encounters:  03/11/19 165 lb 12.8 oz (75.2 kg)  02/22/19 166 lb 9.6 oz (75.6 kg)  01/27/19 166 lb 3.6 oz (75.4 kg)      Other studies Reviewed:  Echocardiogram 01/01/2019  . The left ventricle has low normal systolic function, with an ejection fraction of 50-55%. The cavity size was normal. There is mildly increased left ventricular wall thickness. Left ventricular diastolic Doppler parameters are indeterminate. There is  abnormal septal motion consistent with post-operative status.  2. The right ventricle has mildly reduced systolic function. The cavity was normal. There is no increase in right ventricular wall thickness. Right ventricular systolic pressure is moderately elevated with an estimated pressure of 55.2 mmHg.  3. Left atrial size was severely dilated.  4.  A 26 Edwards Sapien bioprosthetic aortic valve (TAVR) valve is present in the aortic position. Procedure Date: 09/17/17 Echo findings shows no evidence of rocking, dehiscence. Mean systolic gradient 11 mmHg, calculated valve area approximately 2 cm2  using LVOT diameter of 2.6 cm and LVOT TVI 14 cm. Likely trivial prosthetic aortic valve regurgitation, though challenging to assess whether this  is prosthetic or paravalvular due to image quality.  5. The mitral valve is abnormal. There is moderate mitral annular calcification present. Mitral valve regurgitation is mild to moderate by color flow Doppler. Moderate-severe mitral valve stenosis. Mean diastolic gradient 13 mmHg at HR 100-105.  6. Tricuspid valve regurgitation is moderate.  7. When compared to the prior study: 11/24/2018 - no significant change in LV function. No change in degree of mitral valve stenosis. Stable function of aortic prosthesis. Side by side comparison of images performed.   ASSESSMENT AND PLAN:  1.  Atrial fibrillation: Heart rate is currently well controlled.  He remains on metoprolol 25 mg twice daily, digoxin 0.125 mg daily.  He has not had any further complaints of rapid heart rhythm.  There is no evidence of significant bruising.  He continues to be treated for the small hematoma on the tip of the left great toe.  He continues on apixaban 5 mg twice daily.  2.  Mixed CHF: There is no evidence of fluid overload on exam.  He remains on torsemide 40 mg twice daily.  Blood pressure is low normal compared to blood pressure recorded n the hospital.  Blood pressure was running in the 175Z and 025E systolic.  He may need to decrease his dose of torsemide now as he is getting good results.  I would decrease it to 40 mg in the morning and 20 mg in the afternoon if creatinine begins to rise on follow-up labs by PCP.  I have also explained to the wife that if his blood pressure continues to be soft she may need to call the PCP to let them know to be seen sooner.  Trying to avoid dehydration.  Most recent creatinine was normal.    3.  Coronary artery disease: History of CABG in 1994, PTCA of the distal LAD via the LIMA graft in 2010.  The patient denies any recurrent discomfort.  No further ischemic work-up planned.  4.  Bilateral pleural effusions: The patient did have thoracentesis completed during hospitalization.  Lung  sounds are clear at time of exam.  O2 sats on 3 L are 100%.  5.  Chronic anemia: Most recent hemoglobin was 8.4 on discharge.  He is due to have lab work drawn again in the next couple of weeks by primary care.  He is on iron replacement with ferrous sulfate 325 mg daily.  6.  Hypercholesterolemia: Remains on atorvastatin 80 mg at at bedtime.  7.  Carotid artery disease: Both he and his wife have requested that follow-up carotid Doppler ultrasound be canceled.  We will notify testing site to cancel this procedure.   Current medicines are reviewed at length with the patient today.  We will fax a copy of the discharge summary to primary care physician, also a copy of the discharge summary has been printed and given to his wife to bring with them on the next office appointment if the discharge summary does not arrive at PCP office.  We are canceling carotid artery Doppler studies and have notified the testing side of this.  Labs/ tests ordered today include: None today, due  for labs in the next couple weeks with PCP.  [name] Myron. West Pugh, ANP, AACC   03/11/2019 4:10 PM    [place] [address] Office [phone] Fax [phone]  Notice: This dictation was prepared with Dragon dictation along with smaller phrase technology. Any transcriptional errors that result from this process are unintentional and may not be corrected upon review.

## 2019-03-12 ENCOUNTER — Encounter (HOSPITAL_COMMUNITY): Payer: Medicare Other

## 2019-03-12 DIAGNOSIS — J439 Emphysema, unspecified: Secondary | ICD-10-CM | POA: Diagnosis not present

## 2019-03-12 DIAGNOSIS — I110 Hypertensive heart disease with heart failure: Secondary | ICD-10-CM | POA: Diagnosis not present

## 2019-03-12 DIAGNOSIS — I25110 Atherosclerotic heart disease of native coronary artery with unstable angina pectoris: Secondary | ICD-10-CM | POA: Diagnosis not present

## 2019-03-12 DIAGNOSIS — I4821 Permanent atrial fibrillation: Secondary | ICD-10-CM | POA: Diagnosis not present

## 2019-03-12 DIAGNOSIS — I5033 Acute on chronic diastolic (congestive) heart failure: Secondary | ICD-10-CM | POA: Diagnosis not present

## 2019-03-12 DIAGNOSIS — E1151 Type 2 diabetes mellitus with diabetic peripheral angiopathy without gangrene: Secondary | ICD-10-CM | POA: Diagnosis not present

## 2019-03-14 DIAGNOSIS — L02612 Cutaneous abscess of left foot: Secondary | ICD-10-CM | POA: Diagnosis not present

## 2019-03-14 DIAGNOSIS — L02611 Cutaneous abscess of right foot: Secondary | ICD-10-CM | POA: Diagnosis not present

## 2019-03-17 DIAGNOSIS — I509 Heart failure, unspecified: Secondary | ICD-10-CM | POA: Diagnosis not present

## 2019-03-17 DIAGNOSIS — I2510 Atherosclerotic heart disease of native coronary artery without angina pectoris: Secondary | ICD-10-CM | POA: Diagnosis not present

## 2019-03-17 DIAGNOSIS — I96 Gangrene, not elsewhere classified: Secondary | ICD-10-CM | POA: Diagnosis not present

## 2019-03-17 DIAGNOSIS — E1122 Type 2 diabetes mellitus with diabetic chronic kidney disease: Secondary | ICD-10-CM | POA: Diagnosis not present

## 2019-03-17 DIAGNOSIS — I132 Hypertensive heart and chronic kidney disease with heart failure and with stage 5 chronic kidney disease, or end stage renal disease: Secondary | ICD-10-CM | POA: Diagnosis not present

## 2019-03-17 DIAGNOSIS — E1152 Type 2 diabetes mellitus with diabetic peripheral angiopathy with gangrene: Secondary | ICD-10-CM | POA: Diagnosis not present

## 2019-03-17 DIAGNOSIS — Z794 Long term (current) use of insulin: Secondary | ICD-10-CM | POA: Diagnosis not present

## 2019-03-17 DIAGNOSIS — S91102A Unspecified open wound of left great toe without damage to nail, initial encounter: Secondary | ICD-10-CM | POA: Diagnosis not present

## 2019-03-17 DIAGNOSIS — I771 Stricture of artery: Secondary | ICD-10-CM | POA: Diagnosis not present

## 2019-03-17 DIAGNOSIS — L89612 Pressure ulcer of right heel, stage 2: Secondary | ICD-10-CM | POA: Diagnosis not present

## 2019-03-17 DIAGNOSIS — I252 Old myocardial infarction: Secondary | ICD-10-CM | POA: Diagnosis not present

## 2019-03-17 DIAGNOSIS — N186 End stage renal disease: Secondary | ICD-10-CM | POA: Diagnosis not present

## 2019-03-17 DIAGNOSIS — J449 Chronic obstructive pulmonary disease, unspecified: Secondary | ICD-10-CM | POA: Diagnosis not present

## 2019-03-18 DIAGNOSIS — J439 Emphysema, unspecified: Secondary | ICD-10-CM | POA: Diagnosis not present

## 2019-03-18 DIAGNOSIS — E1151 Type 2 diabetes mellitus with diabetic peripheral angiopathy without gangrene: Secondary | ICD-10-CM | POA: Diagnosis not present

## 2019-03-18 DIAGNOSIS — I4821 Permanent atrial fibrillation: Secondary | ICD-10-CM | POA: Diagnosis not present

## 2019-03-18 DIAGNOSIS — I25110 Atherosclerotic heart disease of native coronary artery with unstable angina pectoris: Secondary | ICD-10-CM | POA: Diagnosis not present

## 2019-03-18 DIAGNOSIS — I110 Hypertensive heart disease with heart failure: Secondary | ICD-10-CM | POA: Diagnosis not present

## 2019-03-18 DIAGNOSIS — I5033 Acute on chronic diastolic (congestive) heart failure: Secondary | ICD-10-CM | POA: Diagnosis not present

## 2019-03-20 DIAGNOSIS — J439 Emphysema, unspecified: Secondary | ICD-10-CM | POA: Diagnosis not present

## 2019-03-20 DIAGNOSIS — I5033 Acute on chronic diastolic (congestive) heart failure: Secondary | ICD-10-CM | POA: Diagnosis not present

## 2019-03-20 DIAGNOSIS — I4821 Permanent atrial fibrillation: Secondary | ICD-10-CM | POA: Diagnosis not present

## 2019-03-20 DIAGNOSIS — E1151 Type 2 diabetes mellitus with diabetic peripheral angiopathy without gangrene: Secondary | ICD-10-CM | POA: Diagnosis not present

## 2019-03-20 DIAGNOSIS — I110 Hypertensive heart disease with heart failure: Secondary | ICD-10-CM | POA: Diagnosis not present

## 2019-03-20 DIAGNOSIS — I25110 Atherosclerotic heart disease of native coronary artery with unstable angina pectoris: Secondary | ICD-10-CM | POA: Diagnosis not present

## 2019-03-23 ENCOUNTER — Other Ambulatory Visit: Payer: Self-pay

## 2019-03-23 ENCOUNTER — Inpatient Hospital Stay (HOSPITAL_COMMUNITY)
Admission: EM | Admit: 2019-03-23 | Discharge: 2019-03-26 | DRG: 292 | Disposition: A | Discharge status: Home Health | Payer: Medicare Other | Attending: Student | Admitting: Student

## 2019-03-23 ENCOUNTER — Emergency Department (HOSPITAL_COMMUNITY): Payer: Medicare Other

## 2019-03-23 ENCOUNTER — Encounter (HOSPITAL_COMMUNITY): Payer: Self-pay | Admitting: *Deleted

## 2019-03-23 DIAGNOSIS — R069 Unspecified abnormalities of breathing: Secondary | ICD-10-CM | POA: Diagnosis not present

## 2019-03-23 DIAGNOSIS — I959 Hypotension, unspecified: Secondary | ICD-10-CM | POA: Diagnosis not present

## 2019-03-23 DIAGNOSIS — J90 Pleural effusion, not elsewhere classified: Secondary | ICD-10-CM | POA: Diagnosis not present

## 2019-03-23 DIAGNOSIS — Z20828 Contact with and (suspected) exposure to other viral communicable diseases: Secondary | ICD-10-CM | POA: Diagnosis present

## 2019-03-23 DIAGNOSIS — E871 Hypo-osmolality and hyponatremia: Secondary | ICD-10-CM | POA: Diagnosis present

## 2019-03-23 DIAGNOSIS — J441 Chronic obstructive pulmonary disease with (acute) exacerbation: Secondary | ICD-10-CM | POA: Diagnosis present

## 2019-03-23 DIAGNOSIS — D638 Anemia in other chronic diseases classified elsewhere: Secondary | ICD-10-CM | POA: Diagnosis present

## 2019-03-23 DIAGNOSIS — J9600 Acute respiratory failure, unspecified whether with hypoxia or hypercapnia: Secondary | ICD-10-CM

## 2019-03-23 DIAGNOSIS — Z9981 Dependence on supplemental oxygen: Secondary | ICD-10-CM

## 2019-03-23 DIAGNOSIS — J9610 Chronic respiratory failure, unspecified whether with hypoxia or hypercapnia: Secondary | ICD-10-CM | POA: Diagnosis present

## 2019-03-23 DIAGNOSIS — M351 Other overlap syndromes: Secondary | ICD-10-CM | POA: Diagnosis present

## 2019-03-23 DIAGNOSIS — Z951 Presence of aortocoronary bypass graft: Secondary | ICD-10-CM | POA: Diagnosis not present

## 2019-03-23 DIAGNOSIS — J9811 Atelectasis: Secondary | ICD-10-CM | POA: Diagnosis not present

## 2019-03-23 DIAGNOSIS — Z794 Long term (current) use of insulin: Secondary | ICD-10-CM

## 2019-03-23 DIAGNOSIS — K219 Gastro-esophageal reflux disease without esophagitis: Secondary | ICD-10-CM | POA: Diagnosis present

## 2019-03-23 DIAGNOSIS — G4733 Obstructive sleep apnea (adult) (pediatric): Secondary | ICD-10-CM | POA: Diagnosis present

## 2019-03-23 DIAGNOSIS — I252 Old myocardial infarction: Secondary | ICD-10-CM | POA: Diagnosis not present

## 2019-03-23 DIAGNOSIS — Z952 Presence of prosthetic heart valve: Secondary | ICD-10-CM | POA: Diagnosis not present

## 2019-03-23 DIAGNOSIS — Z7951 Long term (current) use of inhaled steroids: Secondary | ICD-10-CM

## 2019-03-23 DIAGNOSIS — J411 Mucopurulent chronic bronchitis: Secondary | ICD-10-CM | POA: Diagnosis not present

## 2019-03-23 DIAGNOSIS — E1151 Type 2 diabetes mellitus with diabetic peripheral angiopathy without gangrene: Secondary | ICD-10-CM | POA: Diagnosis present

## 2019-03-23 DIAGNOSIS — N390 Urinary tract infection, site not specified: Secondary | ICD-10-CM | POA: Diagnosis not present

## 2019-03-23 DIAGNOSIS — I4821 Permanent atrial fibrillation: Secondary | ICD-10-CM | POA: Diagnosis present

## 2019-03-23 DIAGNOSIS — Z978 Presence of other specified devices: Secondary | ICD-10-CM

## 2019-03-23 DIAGNOSIS — Z8249 Family history of ischemic heart disease and other diseases of the circulatory system: Secondary | ICD-10-CM | POA: Diagnosis not present

## 2019-03-23 DIAGNOSIS — E1165 Type 2 diabetes mellitus with hyperglycemia: Secondary | ICD-10-CM | POA: Diagnosis not present

## 2019-03-23 DIAGNOSIS — I10 Essential (primary) hypertension: Secondary | ICD-10-CM | POA: Diagnosis present

## 2019-03-23 DIAGNOSIS — R339 Retention of urine, unspecified: Secondary | ICD-10-CM | POA: Diagnosis present

## 2019-03-23 DIAGNOSIS — E1159 Type 2 diabetes mellitus with other circulatory complications: Secondary | ICD-10-CM | POA: Diagnosis not present

## 2019-03-23 DIAGNOSIS — Z87891 Personal history of nicotine dependence: Secondary | ICD-10-CM

## 2019-03-23 DIAGNOSIS — Z8349 Family history of other endocrine, nutritional and metabolic diseases: Secondary | ICD-10-CM

## 2019-03-23 DIAGNOSIS — E785 Hyperlipidemia, unspecified: Secondary | ICD-10-CM | POA: Diagnosis present

## 2019-03-23 DIAGNOSIS — I110 Hypertensive heart disease with heart failure: Principal | ICD-10-CM | POA: Diagnosis present

## 2019-03-23 DIAGNOSIS — D649 Anemia, unspecified: Secondary | ICD-10-CM

## 2019-03-23 DIAGNOSIS — I2510 Atherosclerotic heart disease of native coronary artery without angina pectoris: Secondary | ICD-10-CM | POA: Diagnosis present

## 2019-03-23 DIAGNOSIS — Z7901 Long term (current) use of anticoagulants: Secondary | ICD-10-CM

## 2019-03-23 DIAGNOSIS — I4891 Unspecified atrial fibrillation: Secondary | ICD-10-CM | POA: Diagnosis present

## 2019-03-23 DIAGNOSIS — R0602 Shortness of breath: Secondary | ICD-10-CM | POA: Diagnosis not present

## 2019-03-23 DIAGNOSIS — I5033 Acute on chronic diastolic (congestive) heart failure: Secondary | ICD-10-CM | POA: Diagnosis present

## 2019-03-23 DIAGNOSIS — Z881 Allergy status to other antibiotic agents status: Secondary | ICD-10-CM

## 2019-03-23 DIAGNOSIS — E119 Type 2 diabetes mellitus without complications: Secondary | ICD-10-CM

## 2019-03-23 DIAGNOSIS — R52 Pain, unspecified: Secondary | ICD-10-CM | POA: Diagnosis not present

## 2019-03-23 DIAGNOSIS — Z66 Do not resuscitate: Secondary | ICD-10-CM | POA: Diagnosis present

## 2019-03-23 DIAGNOSIS — J449 Chronic obstructive pulmonary disease, unspecified: Secondary | ICD-10-CM | POA: Diagnosis present

## 2019-03-23 DIAGNOSIS — E1169 Type 2 diabetes mellitus with other specified complication: Secondary | ICD-10-CM | POA: Diagnosis present

## 2019-03-23 LAB — CBC WITH DIFFERENTIAL/PLATELET
Abs Immature Granulocytes: 0.02 10*3/uL (ref 0.00–0.07)
Basophils Absolute: 0.1 10*3/uL (ref 0.0–0.1)
Basophils Relative: 1 %
Eosinophils Absolute: 0.3 10*3/uL (ref 0.0–0.5)
Eosinophils Relative: 4 %
HCT: 28.7 % — ABNORMAL LOW (ref 39.0–52.0)
Hemoglobin: 8.8 g/dL — ABNORMAL LOW (ref 13.0–17.0)
Immature Granulocytes: 0 %
Lymphocytes Relative: 11 %
Lymphs Abs: 0.9 10*3/uL (ref 0.7–4.0)
MCH: 26.7 pg (ref 26.0–34.0)
MCHC: 30.7 g/dL (ref 30.0–36.0)
MCV: 87.2 fL (ref 80.0–100.0)
Monocytes Absolute: 0.9 10*3/uL (ref 0.1–1.0)
Monocytes Relative: 12 %
Neutro Abs: 5.5 10*3/uL (ref 1.7–7.7)
Neutrophils Relative %: 72 %
Platelets: 318 10*3/uL (ref 150–400)
RBC: 3.29 MIL/uL — ABNORMAL LOW (ref 4.22–5.81)
RDW: 17.7 % — ABNORMAL HIGH (ref 11.5–15.5)
WBC: 7.7 10*3/uL (ref 4.0–10.5)
nRBC: 0 % (ref 0.0–0.2)

## 2019-03-23 LAB — TROPONIN I (HIGH SENSITIVITY): Troponin I (High Sensitivity): 28 ng/L — ABNORMAL HIGH (ref <18)

## 2019-03-23 LAB — BASIC METABOLIC PANEL
Anion gap: 12 (ref 5–15)
BUN: 10 mg/dL (ref 8–23)
CO2: 29 mmol/L (ref 22–32)
Calcium: 8.9 mg/dL (ref 8.9–10.3)
Chloride: 88 mmol/L — ABNORMAL LOW (ref 98–111)
Creatinine, Ser: 1.02 mg/dL (ref 0.61–1.24)
GFR calc Af Amer: >60 mL/min (ref >60)
GFR calc non Af Amer: >60 mL/min (ref >60)
Glucose, Bld: 133 mg/dL — ABNORMAL HIGH (ref 70–99)
Potassium: 4.2 mmol/L (ref 3.5–5.1)
Sodium: 129 mmol/L — ABNORMAL LOW (ref 135–145)

## 2019-03-23 LAB — BRAIN NATRIURETIC PEPTIDE: B Natriuretic Peptide: 275.2 pg/mL — ABNORMAL HIGH (ref 0.0–100.0)

## 2019-03-23 LAB — GLUCOSE, CAPILLARY: Glucose-Capillary: 165 mg/dL — ABNORMAL HIGH (ref 70–99)

## 2019-03-23 MED ORDER — FUROSEMIDE 10 MG/ML IJ SOLN
20.0000 mg | Freq: Once | INTRAMUSCULAR | Status: AC
  Administered 2019-03-23: 20 mg via INTRAVENOUS
  Filled 2019-03-23: qty 2

## 2019-03-23 MED ORDER — DIGOXIN 125 MCG PO TABS
0.1250 mg | ORAL_TABLET | Freq: Every day | ORAL | Status: DC
  Administered 2019-03-24 – 2019-03-25 (×2): 0.125 mg via ORAL
  Filled 2019-03-23 (×2): qty 1

## 2019-03-23 MED ORDER — TAMSULOSIN HCL 0.4 MG PO CAPS
0.4000 mg | ORAL_CAPSULE | Freq: Every evening | ORAL | Status: DC
  Administered 2019-03-23 – 2019-03-25 (×3): 0.4 mg via ORAL
  Filled 2019-03-23 (×3): qty 1

## 2019-03-23 MED ORDER — INSULIN ASPART 100 UNIT/ML ~~LOC~~ SOLN
0.0000 [IU] | Freq: Three times a day (TID) | SUBCUTANEOUS | Status: DC
  Administered 2019-03-24: 1 [IU] via SUBCUTANEOUS
  Administered 2019-03-24 (×2): 2 [IU] via SUBCUTANEOUS
  Administered 2019-03-25 (×2): 1 [IU] via SUBCUTANEOUS

## 2019-03-23 MED ORDER — ATORVASTATIN CALCIUM 80 MG PO TABS
80.0000 mg | ORAL_TABLET | Freq: Every day | ORAL | Status: DC
  Administered 2019-03-23 – 2019-03-25 (×3): 80 mg via ORAL
  Filled 2019-03-23 (×3): qty 1

## 2019-03-23 MED ORDER — ALLOPURINOL 300 MG PO TABS
300.0000 mg | ORAL_TABLET | Freq: Every day | ORAL | Status: DC
  Administered 2019-03-24 – 2019-03-26 (×3): 300 mg via ORAL
  Filled 2019-03-23 (×3): qty 1

## 2019-03-23 MED ORDER — PANTOPRAZOLE SODIUM 20 MG PO TBEC
20.0000 mg | DELAYED_RELEASE_TABLET | Freq: Every day | ORAL | Status: DC
  Administered 2019-03-24 – 2019-03-26 (×3): 20 mg via ORAL
  Filled 2019-03-23 (×3): qty 1

## 2019-03-23 MED ORDER — POTASSIUM CHLORIDE 20 MEQ/15ML (10%) PO SOLN
20.0000 meq | Freq: Two times a day (BID) | ORAL | Status: DC
  Administered 2019-03-23 – 2019-03-25 (×4): 20 meq via ORAL
  Filled 2019-03-23 (×4): qty 15

## 2019-03-23 MED ORDER — UMECLIDINIUM BROMIDE 62.5 MCG/INH IN AEPB
1.0000 | INHALATION_SPRAY | Freq: Every day | RESPIRATORY_TRACT | Status: DC
  Administered 2019-03-24 – 2019-03-26 (×3): 1 via RESPIRATORY_TRACT
  Filled 2019-03-23: qty 7

## 2019-03-23 MED ORDER — ALBUTEROL SULFATE HFA 108 (90 BASE) MCG/ACT IN AERS
4.0000 | INHALATION_SPRAY | Freq: Once | RESPIRATORY_TRACT | Status: AC
  Administered 2019-03-23: 4 via RESPIRATORY_TRACT
  Filled 2019-03-23: qty 6.7

## 2019-03-23 MED ORDER — BRIMONIDINE TARTRATE 0.2 % OP SOLN
1.0000 [drp] | Freq: Two times a day (BID) | OPHTHALMIC | Status: DC
  Administered 2019-03-23 – 2019-03-26 (×6): 1 [drp] via OPHTHALMIC
  Filled 2019-03-23: qty 5

## 2019-03-23 MED ORDER — FUROSEMIDE 10 MG/ML IJ SOLN
40.0000 mg | Freq: Once | INTRAMUSCULAR | Status: AC
  Administered 2019-03-23: 40 mg via INTRAVENOUS
  Filled 2019-03-23: qty 4

## 2019-03-23 MED ORDER — METHYLPREDNISOLONE SODIUM SUCC 125 MG IJ SOLR
125.0000 mg | Freq: Once | INTRAMUSCULAR | Status: AC
  Administered 2019-03-23: 125 mg via INTRAVENOUS
  Filled 2019-03-23: qty 2

## 2019-03-23 MED ORDER — SPIRONOLACTONE 25 MG PO TABS
25.0000 mg | ORAL_TABLET | Freq: Every day | ORAL | Status: DC
  Administered 2019-03-24 – 2019-03-26 (×3): 25 mg via ORAL
  Filled 2019-03-23 (×3): qty 1

## 2019-03-23 MED ORDER — ACETAMINOPHEN 325 MG PO TABS
650.0000 mg | ORAL_TABLET | ORAL | Status: DC | PRN
  Administered 2019-03-25: 650 mg via ORAL
  Filled 2019-03-23 (×2): qty 2

## 2019-03-23 MED ORDER — FUROSEMIDE 10 MG/ML IJ SOLN
60.0000 mg | Freq: Two times a day (BID) | INTRAMUSCULAR | Status: DC
  Administered 2019-03-24 – 2019-03-25 (×3): 60 mg via INTRAVENOUS
  Filled 2019-03-23 (×3): qty 6

## 2019-03-23 MED ORDER — MELATONIN 3 MG PO TABS
6.0000 mg | ORAL_TABLET | Freq: Every day | ORAL | Status: DC
  Administered 2019-03-23 – 2019-03-25 (×3): 6 mg via ORAL
  Filled 2019-03-23 (×5): qty 2

## 2019-03-23 MED ORDER — SODIUM CHLORIDE 0.9 % IV SOLN: 250.0000 mL | INTRAVENOUS | Status: DC | PRN

## 2019-03-23 MED ORDER — ONDANSETRON HCL 4 MG/2ML IJ SOLN: 4.0000 mg | Freq: Four times a day (QID) | INTRAMUSCULAR | Status: DC | PRN

## 2019-03-23 MED ORDER — ALBUTEROL SULFATE HFA 108 (90 BASE) MCG/ACT IN AERS
1.0000 | INHALATION_SPRAY | RESPIRATORY_TRACT | Status: DC | PRN
  Administered 2019-03-24 (×2): 2 via RESPIRATORY_TRACT
  Filled 2019-03-23: qty 6.7

## 2019-03-23 MED ORDER — MOMETASONE FURO-FORMOTEROL FUM 100-5 MCG/ACT IN AERO
2.0000 | INHALATION_SPRAY | Freq: Two times a day (BID) | RESPIRATORY_TRACT | Status: DC
  Administered 2019-03-23 – 2019-03-26 (×6): 2 via RESPIRATORY_TRACT
  Filled 2019-03-23: qty 8.8

## 2019-03-23 MED ORDER — SODIUM CHLORIDE 0.9% FLUSH: 3.0000 mL | INTRAVENOUS | Status: DC | PRN

## 2019-03-23 MED ORDER — TRAMADOL HCL 50 MG PO TABS
50.0000 mg | ORAL_TABLET | Freq: Once | ORAL | Status: AC
  Administered 2019-03-23: 50 mg via ORAL
  Filled 2019-03-23: qty 1

## 2019-03-23 MED ORDER — METOPROLOL TARTRATE 25 MG PO TABS
25.0000 mg | ORAL_TABLET | Freq: Two times a day (BID) | ORAL | Status: DC
  Administered 2019-03-23 – 2019-03-26 (×6): 25 mg via ORAL
  Filled 2019-03-23 (×6): qty 1

## 2019-03-23 MED ORDER — SODIUM CHLORIDE 0.9% FLUSH
3.0000 mL | Freq: Two times a day (BID) | INTRAVENOUS | Status: DC
  Administered 2019-03-23 – 2019-03-26 (×6): 3 mL via INTRAVENOUS

## 2019-03-23 NOTE — H&P (Signed)
History and Physical    [name] IFO:[id number] DOB: 04-14-1932 DOA: 03/23/2019  PCP: Nicoletta Dress, MD  Patient coming from: Home  I have personally briefly reviewed patient's old medical records in [place]  Chief Complaint: Shortness of breath  HPI: [name] is a 85 y.o. male with medical history significant for CAD s/p CABG, chronic diastolic CHF, A. fib on Eliquis, ASA s/p TAVR, pulmonary hypertension, COPD on chronic 3 L supplemental O2, T2DM, HTN, HLD, anemia, gout, urinary retention with chronic indwelling Foley catheter, and OSA who presents to the hospital for evaluation of dyspnea.  Patient states he had new onset of dyspnea prior to going to bed evening of 03/22/2019.  He had associated orthopnea and paroxysmal nocturnal dyspnea.  He reports intermittent cough productive of clear sputum.  He denies any chest pain, palpitations, abdominal pain, dysuria, or swelling in his legs.  He has a chronic indwelling Foley catheter for urinary retention and states that it was exchanged yesterday (03/22/2019).  He reports good urine output.  He has not had any subjective fevers, chills, diaphoresis.  He has a chronic necrotic appearing wound at the distal left first toe which he says is being followed as an outpatient.  ED Course:  Initial vitals showed BP 161/67, pulse 80, RR 25, SPO2 100% on 4 L supplemental O2 via [place].  Labs are notable for WBC 7.7, hemoglobin 8.8, platelets 318,000, sodium 129, potassium 4.2, BUN 10, creatinine 1.02, bicarb 29, serum glucose 133, BNP 275.2, high-sensitivity troponin I 28.  SARS-CoV-2 test was obtained and pending.  Portable chest x-ray showed prior sternotomy changes with increased moderate left pleural effusion compared to prior and small right pleural effusion.  Mild interstitial edema noted.  Patient was given IV Lasix 40 mg, IV Solu-Medrol 125 mg, and albuterol inhaler treatment.  Hospitalist service was consulted admit for  further evaluation and management.  Review of Systems: All systems reviewed and are negative except as documented in history of present illness above.   Past Medical History:  Diagnosis Date  . Acute myocardial infarction, unspecified site, initial episode of care   . Anemia   . Anxiety   . Aortic stenosis    . Atrial fibrillation 06/27/2013  . Atrial fibrillation ([place])    a. permanent, on Coumadin for anticoagulation  . BPPV (benign paroxysmal positional vertigo) 2015  . CAD (coronary artery disease)    a. s/p CABG in 1994 b. cath in 2015 showing normal LM, 100% LCx and RCA stenosis with 50-60% prox LAD stenosis and 100% mid-LAD stenosis; patent sequential SVG-OM2-OM3 and patent LIMA-LAD with PTCA of distal LAD via LIMA graft performed at that time  . Carotid stenosis   . CAROTID STENOSIS- moderate 09/09/2008   Qualifier: Diagnosis of  By: Burnett Kanaris    . Cellulitis 11/15/2018  . Chest pain with moderate risk of acute coronary syndrome 06/27/2013  . CHF (congestive heart failure) ([place])   . Chronic anticoagulation 06/27/2013  . COPD (chronic obstructive pulmonary disease) ([place])   . Depression   . Diabetes mellitus without complication ([place])    FASTING 98-120S  . GERD (gastroesophageal reflux disease)   . Gout attack- on steroid dose pack 06/27/2013  . History of blood transfusion   . History of peptic ulcer disease   . Hyperlipidemia   . Hypertension   . Left atrial severely dilated 01/19/2019  . Myocardial infarction ([place])    X2  . NSTEMI (non-ST elevated myocardial infarction) ([place]) 08/2013  .  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 PEPTIC ULCER DISEASE, HX OF 09/09/2008   Qualifier: Diagnosis of  By: Burnett Kanaris    . PERIPHERAL VASCULAR DISEASE 09/09/2008   Qualifier: Diagnosis of  By: Burnett Kanaris    . Peripheral vascular disease ([place])   . Pneumonia    HX OF  . Presence of indwelling Foley catheter  01/20/2019  . Pressure injury of skin 12/17/2018  . Renal artery stenosis ([place])   . S/P thoracentesis   . Severe aortic stenosis 09/17/2017  . Sleep apnea    OXYGEN AT NIGHT 2L [place]  . Stress fracture of calcaneus 2018   Dr Paulla Dolly [facility])  . Tachycardia- beta blocker increased 06/26/2013  . UNSPECIFIED ANEMIA 09/09/2008   Qualifier: Diagnosis of  By: Burnett Kanaris    . UNSPECIFIED CEREBROVASCULAR DISEASE 09/09/2008   Qualifier: Diagnosis of  By: Burnett Kanaris    . Unstable angina ([place]) 08/31/2013  . Vertigo 06/27/2013    Past Surgical History:  Procedure Laterality Date  . AORTIC ARCH ANGIOGRAPHY N/A 08/14/2017   Procedure: AORTIC ARCH ANGIOGRAPHY;  Surgeon: Sherren Mocha, MD;  Location: [place] [facility];  Service: Cardiovascular;  Laterality: N/A;  . BOWEL RESECTION  07/2013   Bowel perforation  . CARDIAC CATHETERIZATION    . CAROTID ENDARTERECTOMY Right 2014   Curt Jews, MD  . COLON SURGERY    . CORONARY ARTERY BYPASS GRAFT  1994  . ENDARTERECTOMY Right 01/05/2013   Procedure: ENDARTERECTOMY CAROTID;  Surgeon: Rosetta Posner, MD;  Location: [place];  Service: Vascular;  Laterality: Right;  . INCISION AND DRAINAGE Left 11/16/2018   Procedure: INCISION AND DRAINAGE LEFT FOREARM/ARM;  Surgeon: Leanora Cover, MD;  Location: [place];  Service: Orthopedics;  Laterality: Left;  . KNEE SURGERY  08/2003   left knee/ARTHROSCOPIC  . LEFT HEART CATHETERIZATION WITH CORONARY/GRAFT ANGIOGRAM N/A 09/01/2013   Procedure: LEFT HEART CATHETERIZATION WITH Beatrix Fetters;  Surgeon: Sinclair Grooms, MD;  Location: [facility] CATH LAB;  Service: Cardiovascular;  Laterality: N/A;  . PATCH ANGIOPLASTY Right 01/05/2013   Procedure: PATCH ANGIOPLASTY;  Surgeon: Rosetta Posner, MD;  Location: [place];  Service: Vascular;  Laterality: Right;  . PERCUTANEOUS CORONARY STENT INTERVENTION (PCI-S)  09/01/2013   Procedure: PERCUTANEOUS CORONARY STENT INTERVENTION (PCI-S);  Surgeon: Sinclair Grooms, MD;  Location: [facility] CATH  LAB;  Service: Cardiovascular;;  . removal of bleeding ulcer  1994  . RENAL ARTERY STENT     stenting of the left renal artery as well as a cutting balloon angioplasty for treatment of in-stent restenosis coronary artery bypass grafting in 1994.  [name] RIGHT/LEFT HEART CATH AND CORONARY/GRAFT ANGIOGRAPHY N/A 08/14/2017   Procedure: RIGHT/LEFT HEART CATH AND CORONARY/GRAFT ANGIOGRAPHY;  Surgeon: Sherren Mocha, MD;  Location: [place] [facility];  Service: Cardiovascular;  Laterality: N/A;  . TEE WITHOUT CARDIOVERSION N/A 09/17/2017   Procedure: TRANSESOPHAGEAL ECHOCARDIOGRAM (TEE);  Surgeon: Sherren Mocha, MD;  Location: [place];  Service: Open Heart Surgery;  Laterality: N/A;    Social History:  reports that he quit smoking about 25 years ago. He has never used smokeless tobacco. He reports that he does not drink alcohol or use drugs.  Allergies  Allergen Reactions  . Bactrim [Sulfamethoxazole-Trimethoprim] Other (See Comments)    "Allergic," per MAR  . Levaquin [Levofloxacin In D5w] Diarrhea and Other (See Comments)    "Allergic," per [facility]    Family History  Problem Relation Age of  Onset  . Coronary artery disease Mother   . Heart disease Mother        Before age 23  . Hyperlipidemia Mother   . Hypertension Mother   . Heart attack Mother   . Coronary artery disease Father   . Heart disease Father        After age 65  . Heart attack Father   . Heart disease Sister        After age 40  . Heart disease Brother        After age 32  . Coronary artery disease Other        13 sibling, almost all have coronary disease and some with premature onset  . Heart disease Other        13 sibling, almost all have coronary disease and some with premature onset     Prior to Admission medications   Medication Sig Start Date End Date Taking? Authorizing Provider  acyclovir (ZOVIRAX) 800 MG tablet Take 400 mg by mouth 2 (two) times daily.   Yes [provider]  allopurinol (ZYLOPRIM) 300  MG tablet Take 300 mg by mouth daily.   Yes [provider]  apixaban (ELIQUIS) 5 MG TABS tablet Take 1 tablet (5 mg total) by mouth 2 (two) times daily. 12/22/18 03/23/19 Yes Welborn, Ryan, DO  atorvastatin (LIPITOR) 80 MG tablet Take 1 tablet (80 mg total) by mouth at bedtime. 12/07/14  Yes Lelon Perla, MD  brimonidine (ALPHAGAN) 0.2 % ophthalmic solution Place 1 drop into both eyes 2 (two) times daily.   Yes [provider]  budesonide-formoterol (SYMBICORT) 80-4.5 MCG/ACT inhaler Inhale 1 puff into the lungs daily as needed ("for shortness of breath or wheezing- rinse mouth afterwards").  09/05/18  Yes [provider]  Cholecalciferol (VITAMIN D) 50 MCG (2000 UT) tablet Take 1 tablet by mouth daily. 02/13/19  Yes [provider]  digoxin (LANOXIN) 0.125 MG tablet Take 1 tablet (0.125 mg total) by mouth daily. 01/29/19  Yes Shirley, [place], DO  ferrous sulfate 325 (65 FE) MG tablet Take 1 tablet (325 mg total) by mouth daily with breakfast. 02/22/19  Yes Domenic Polite, MD  insulin aspart (NOVOLOG FLEXPEN) 100 UNIT/ML FlexPen Inject 2 Units into the skin See admin instructions. Inject 2 units into the skin at 9 AM in the morning as needed for a BGL >200   Yes [provider]  lansoprazole (PREVACID) 30 MG capsule Take 30 mg by mouth daily at 12 noon.   Yes [provider]  levalbuterol (XOPENEX) 0.63 MG/3ML nebulizer solution Take 3 mLs (0.63 mg total) by nebulization every 6 (six) hours as needed for wheezing or shortness of breath. Patient taking differently: Take 0.63 mg by nebulization 3 (three) times daily.  12/22/18  Yes Lurline Del, DO  Melatonin 5 MG TABS Take 5 mg by mouth at bedtime.   Yes [provider]  metFORMIN (GLUCOPHAGE-XR) 500 MG 24 hr tablet Take 500 mg by mouth at bedtime.  12/10/18  Yes [provider]  metoprolol tartrate (LOPRESSOR) 25 MG tablet Take 1 tablet (25 mg total) by mouth 2 (two) times daily.  02/22/19  Yes Domenic Polite, MD  Multiple Vitamin (MULTIVITAMIN WITH MINERALS) TABS Take 1 tablet by mouth daily.   Yes [provider]  NON FORMULARY Take 120 mLs by mouth See admin instructions. MedPass: Drink 120 ml's by mouth two times a day   Yes [provider]  polyethylene glycol (MIRALAX / GLYCOLAX)  17 g packet Take 17 g by mouth 2 (two) times daily. 12/22/18  Yes Welborn, Ryan, DO  potassium chloride (KLOR-CON M15) 15 MEQ tablet Take 2 tablets (30 mEq total) by mouth 2 (two) times daily. 01/28/19  Yes Enid Derry, [place], DO  spironolactone (ALDACTONE) 25 MG tablet Take 25 mg by mouth daily.   Yes [provider]  tamsulosin (FLOMAX) 0.4 MG CAPS capsule Take 1 capsule (0.4 mg total) by mouth daily after supper. Patient taking differently: Take 0.4 mg by mouth every evening.  12/22/18  Yes Welborn, Ryan, DO  torsemide (DEMADEX) 20 MG tablet Take 2 tablets (40 mg total) by mouth 2 (two) times daily. 02/22/19  Yes Domenic Polite, MD  trolamine salicylate (ASPERCREME) 10 % cream Apply 1 application topically every 6 (six) hours as needed for muscle pain.    Yes [provider]  umeclidinium bromide (INCRUSE ELLIPTA) 62.5 MCG/INH AEPB Inhale 1 puff into the lungs daily. 11/27/18  Yes Matilde Haymaker, MD  OXYGEN Inhale 3 L/min into the lungs continuous. TO MAINTAIN SATS <90%    [provider]    Physical Exam: Vitals:   03/23/19 1757 03/23/19 1805  BP: (!) 161/67   Pulse: 81   Resp: (!) 25   SpO2: 100%   Weight:  78.1 kg  Height:  5\' 11"  (1.803 m)    Constitutional: Elderly man resting supine in bed with head elevated, NAD, calm, comfortable Eyes: PERRL, lids and conjunctivae normal ENMT: Mucous membranes are moist. Posterior pharynx clear of any exudate or lesions.Normal dentition.  Neck: normal, supple, no masses. Respiratory: Diminished breath sounds bilateral lung bases with faint inspiratory crackles.  Slightly increased respiratory effort. No  accessory muscle use.  Cardiovascular: Irregularly irregular, no murmurs / rubs / gallops. No extremity edema. 2+ pedal pulses. Abdomen: no tenderness, no masses palpated. No hepatosplenomegaly. Bowel sounds positive.  GU: Foley catheter in place with clear yellow urine collected Musculoskeletal: no clubbing / cyanosis. No joint deformity upper and lower extremities. Good ROM, no contractures. Normal muscle tone.  Skin: Necrotic appearance of distal left first toe without open wound or drainage, chronic per patient Neurologic: CN 2-12 grossly intact. Sensation intact, Strength 5/5 in all 4.  Psychiatric: Normal judgment and insight. Alert and oriented x 3. Normal mood.    Labs on Admission: I have personally reviewed following labs and imaging studies  CBC: Recent Labs  Lab 03/23/19 1815  WBC 7.7  NEUTROABS 5.5  HGB 8.8*  HCT 28.7*  MCV 87.2  PLT 846   Basic Metabolic Panel: Recent Labs  Lab 03/23/19 1815  NA 129*  K 4.2  CL 88*  CO2 29  GLUCOSE 133*  BUN 10  CREATININE 1.02  CALCIUM 8.9   GFR: Estimated Creatinine Clearance: 56.4 mL/min (by C-G formula based on SCr of 1.02 mg/dL). Liver Function Tests: No results for input(s): AST, ALT, ALKPHOS, BILITOT, PROT, ALBUMIN in the last 168 hours. No results for input(s): LIPASE, AMYLASE in the last 168 hours. No results for input(s): AMMONIA in the last 168 hours. Coagulation Profile: No results for input(s): INR, PROTIME in the last 168 hours. Cardiac Enzymes: No results for input(s): CKTOTAL, CKMB, CKMBINDEX, TROPONINI in the last 168 hours. BNP (last 3 results) No results for input(s): PROBNP in the last 8760 hours. HbA1C: No results for input(s): HGBA1C in the last 72 hours. CBG: No results for input(s): GLUCAP in the last 168 hours. Lipid Profile: No results for input(s): CHOL, HDL, LDLCALC, TRIG, CHOLHDL, LDLDIRECT  in the last 72 hours. Thyroid Function Tests: No results for input(s): TSH, T4TOTAL, FREET4,  T3FREE, THYROIDAB in the last 72 hours. Anemia Panel: No results for input(s): VITAMINB12, FOLATE, FERRITIN, TIBC, IRON, RETICCTPCT in the last 72 hours. Urine analysis:    Component Value Date/Time   COLORURINE AMBER (A) 01/19/2019 1647   APPEARANCEUR CLOUDY (A) 01/19/2019 1647   LABSPEC 1.012 01/19/2019 1647   PHURINE 9.0 (H) 01/19/2019 1647   GLUCOSEU NEGATIVE 01/19/2019 1647   HGBUR MODERATE (A) 01/19/2019 1647   BILIRUBINUR NEGATIVE 01/19/2019 1647   KETONESUR NEGATIVE 01/19/2019 1647   PROTEINUR 100 (A) 01/19/2019 1647   UROBILINOGEN 0.2 12/23/2012 1026   NITRITE POSITIVE (A) 01/19/2019 1647   LEUKOCYTESUR MODERATE (A) 01/19/2019 1647    Radiological Exams on Admission: Dg Chest Portable 1 View  Result Date: 03/23/2019 CLINICAL DATA:  Shortness of breath EXAM: PORTABLE CHEST 1 VIEW COMPARISON:  02/17/2019 FINDINGS: Post sternotomy changes. Moderate left pleural effusion, increased compared to prior. Small right pleural effusion also increased. Mild cardiomegaly with vascular congestion and mild interstitial edema. Dense airspace disease at the lingula and left base. Valve prosthesis. IMPRESSION: 1. Bilateral pleural effusions, small on the right and moderate on the left. 2. Cardiomegaly with vascular congestion and mild interstitial edema 3. Dense atelectasis or pneumonia at the lingula and left base. Electronically Signed   By: Donavan Foil M.D.   On: 03/23/2019 18:32    EKG: Independently reviewed.  Ordered and pending.  Assessment/Plan Principal Problem:   Acute on chronic diastolic (congestive) heart failure (HCC) Active Problems:   Hyperlipidemia associated with type 2 diabetes mellitus (HCC)   Anemia of chronic disease   Essential hypertension, benign   Atrial fibrillation (HCC)   COPD (chronic obstructive pulmonary disease) (HCC)   Bilateral pleural effusion   Presence of indwelling Foley catheter   Type 2 diabetes mellitus (HCC)  [name] is a 85 y.o.  male with medical history significant for CAD s/p CABG, chronic diastolic CHF, A. fib on Eliquis, ASA s/p TAVR, pulmonary hypertension, COPD on chronic 3 L supplemental O2, T2DM, HTN, HLD, anemia, gout, urinary retention with chronic indwelling Foley catheter, and OSA who is admitted with acute on chronic diastolic CHF exacerbation with bilateral pleural effusions.  Acute on chronic diastolic CHF with bilateral pleural effusions: Pleural effusions increased in size compared to prior with moderate size on the left and small on the right. -Continue with IV Lasix 60 mg twice daily -Consider thoracentesis if not significantly improving with diuresis, will hold Eliquis for now in case this is pursued -Strict I/O's, daily weights -Continue Lopressor, spironolactone  COPD with chronic respiratory failure: On 3 L supplemental O2 chronically as an outpatient.  No obvious wheezing on admission. -Continue Dulera, Incruse, and as needed albuterol  Atrial fibrillation: Remains in atrial fibrillation with rate controlled. -Continue Lopressor, digoxin -Holding Eliquis tonight as above  Hyponatremia: Mild and likely from CHF.  Continue to monitor with diuresis.  CAD s/p CABG: Chronic and stable.  Anemia: Chronic and stable relative to recent labs.  Denies any obvious bleeding.  Hypertension: Hypertensive on admission.  Continue IV Lasix, Lopressor, spironolactone.  Type 2 diabetes: On Metformin and SSI at home. -Continue sensitive SSI while in hospital  Hyperlipidemia: Continue atorvastatin.  Urinary retention with chronic indwelling Foley catheter: Exchanged 03/22/2019 per patient.  Wound of left first toe: Chronic necrotic appearing wound of the distal left first toe, patient states this is being followed outpatient.  Does not appear to  be acutely infected.  DVT prophylaxis: SCDs for now Code Status: DNR, confirmed with patient Family Communication: Discussed with patient, he has  discussed with wife Disposition Plan: Pending clinical progress Consults called: None Admission status: Admit - It is my clinical opinion that admission to INPATIENT is reasonable and necessary because of the expectation that this patient will require hospital care that crosses at least 2 midnights to treat this condition based on the medical complexity of the problems presented.  Given the aforementioned information, the predictability of an adverse outcome is felt to be significant.    Zada Finders MD [facility]  If 7PM-7AM, please contact night-coverage [email or web address]  03/23/2019, 8:43 PM

## 2019-03-23 NOTE — ED Triage Notes (Signed)
PT states sob since last night. Pt is on home O2 3L, per norm with sats of 96% with rr of 22.  Afib  With hr 80, hx of same.  CBG 146.  Pt c/o pain to L great toe.  20 G IV R FA.

## 2019-03-23 NOTE — ED Provider Notes (Signed)
[facility] EMERGENCY DEPARTMENT Provider Note   CSN: [id number] Arrival date & time: 03/23/19  1738     History   Chief Complaint Chief Complaint  Patient presents with  . Shortness of Breath    HPI [name] is a 85 y.o. male.  Presents emerge department with chief complaint shortness of breath.  Patient states since yesterday afternoon he is noted increased work of breathing.  States worse particularly with any sort of exertion.  He has noted progression of it throughout the day today as well.  No associated chest pain, no cough no fever.  Has used his nebulized treatment at home with some improvement but not complete resolution.  States he has had spot on his left great toe for the past couple months, is being evaluated at wound care in [place].  No recent changes.  States he is taking an antibiotic for this.     HPI  Past Medical History:  Diagnosis Date  . Acute myocardial infarction, unspecified site, initial episode of care   . Anemia   . Anxiety   . Aortic stenosis    . Atrial fibrillation 06/27/2013  . Atrial fibrillation ([place])    a. permanent, on Coumadin for anticoagulation  . BPPV (benign paroxysmal positional vertigo) 2015  . CAD (coronary artery disease)    a. s/p CABG in 1994 b. cath in 2015 showing normal LM, 100% LCx and RCA stenosis with 50-60% prox LAD stenosis and 100% mid-LAD stenosis; patent sequential SVG-OM2-OM3 and patent LIMA-LAD with PTCA of distal LAD via LIMA graft performed at that time  . Carotid stenosis   . CAROTID STENOSIS- moderate 09/09/2008   Qualifier: Diagnosis of  By: Burnett Kanaris    . Cellulitis 11/15/2018  . Chest pain with moderate risk of acute coronary syndrome 06/27/2013  . CHF (congestive heart failure) ([place])   . Chronic anticoagulation 06/27/2013  . COPD (chronic obstructive pulmonary disease) ([place])   . Depression   . Diabetes mellitus without complication ([place])    FASTING 98-120S  . GERD  (gastroesophageal reflux disease)   . Gout attack- on steroid dose pack 06/27/2013  . History of blood transfusion   . History of peptic ulcer disease   . Hyperlipidemia   . Hypertension   . Left atrial severely dilated 01/19/2019  . Myocardial infarction ([place])    X2  . NSTEMI (non-ST elevated myocardial infarction) ([place]) 08/2013  . 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 PEPTIC ULCER DISEASE, HX OF 09/09/2008   Qualifier: Diagnosis of  By: Burnett Kanaris    . PERIPHERAL VASCULAR DISEASE 09/09/2008   Qualifier: Diagnosis of  By: Burnett Kanaris    . Peripheral vascular disease ([place])   . Pneumonia    HX OF  . Presence of indwelling Foley catheter 01/20/2019  . Pressure injury of skin 12/17/2018  . Renal artery stenosis ([place])   . S/P thoracentesis   . Severe aortic stenosis 09/17/2017  . Sleep apnea    OXYGEN AT NIGHT 2L [place]  . Stress fracture of calcaneus 2018   Dr Paulla Dolly [facility])  . Tachycardia- beta blocker increased 06/26/2013  . UNSPECIFIED ANEMIA 09/09/2008   Qualifier: Diagnosis of  By: Burnett Kanaris    . UNSPECIFIED CEREBROVASCULAR DISEASE 09/09/2008   Qualifier: Diagnosis of  By: Burnett Kanaris    . Unstable angina ([place]) 08/31/2013  . Vertigo 06/27/2013    Patient Active Problem List   Diagnosis Date  Noted  . Acute on chronic diastolic (congestive) heart failure ([place]) 03/23/2019  . Type 2 diabetes mellitus ([place]) 03/23/2019  . Generalized weakness   . Congestive heart failure (CHF) ([place]) 02/17/2019  . Hypoxemia   . Acute pulmonary edema (HCC)   . Heart failure with preserved ejection fraction ([place]) 01/20/2019  . Acute on chronic diastolic heart failure ([place]) 01/20/2019  . Pyuria 01/20/2019  . Hematuria, microscopic 01/20/2019  . Presence of indwelling Foley catheter 01/20/2019  . Goals of care, counseling/discussion   . Palliative care encounter   . Severe Pulmonary hypertension ([place]) 01/19/2019  .  Chronic hypoxemic respiratory failure ([place]) 01/19/2019  . COPD (chronic obstructive pulmonary disease) ([place]) 01/19/2019  . Hypoalbuminemia 01/19/2019  . Bilateral pleural effusion 01/19/2019  . Left atrial severely dilated 01/19/2019  . Dyspnea 12/31/2018  . Pulmonary embolism and infarction ([place])   . Acute on chronic respiratory failure with hypoxia ([place])   . 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 Mitral stenosis   . Atrial fibrillation ([place])   . Chronic anticoagulation 06/27/2013  . 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 Hypertension associated with diabetes ([place]) 03/10/2009  . Hyperlipidemia associated with type 2 diabetes mellitus ([place]) 09/09/2008  . Anemia of chronic disease 09/09/2008  . RENAL ARTERY STENOSIS- moderate 09/09/2008    Past Surgical History:  Procedure Laterality Date  . AORTIC ARCH ANGIOGRAPHY N/A 08/14/2017   Procedure: AORTIC ARCH ANGIOGRAPHY;  Surgeon: Sherren Mocha, MD;  Location: [facility] CV LAB;  Service: Cardiovascular;  Laterality: N/A;  . BOWEL RESECTION  07/2013   Bowel perforation  . CARDIAC CATHETERIZATION    . CAROTID ENDARTERECTOMY Right 2014   Curt Jews, MD  . COLON SURGERY    . CORONARY ARTERY BYPASS GRAFT  1994  . ENDARTERECTOMY Right 01/05/2013   Procedure: ENDARTERECTOMY CAROTID;  Surgeon: Rosetta Posner, MD;  Location: [facility];  Service: Vascular;  Laterality: Right;  . INCISION AND DRAINAGE Left 11/16/2018   Procedure: INCISION AND DRAINAGE LEFT FOREARM/ARM;  Surgeon: Leanora Cover, MD;  Location: [facility];  Service: Orthopedics;  Laterality: Left;  . KNEE SURGERY  08/2003   left knee/ARTHROSCOPIC  . LEFT HEART CATHETERIZATION WITH CORONARY/GRAFT ANGIOGRAM N/A 09/01/2013   Procedure: LEFT HEART CATHETERIZATION WITH Beatrix Fetters;  Surgeon: Sinclair Grooms, MD;  [facility] C[facility] LAB;  Service: Cardiovascular;  Laterality: N/A;  . PATCH  ANGIOPLASTY Right 01/05/2013   Procedure: PATCH ANGIOPLASTY;  Surgeon: Rosetta Posner, MD;  Location: [facility];  Service: Vascular;  Laterality: Right;  . PERCUTANEOUS CORONARY STENT INTERVENTION (PCI-S)  09/01/2013   Procedure: PERCUTANEOUS CORONARY STENT INTERVENTION (PCI-S);  Surgeon: Sinclair Grooms, MD;  Location: [facility] CATH LAB;  Service: Cardiovascular;;  . removal of bleeding ulcer  1994  . RENAL ARTERY STENT     stenting of the left renal artery as well as a cutting balloon angioplasty for treatment of in-stent restenosis coronary artery bypass grafting in 1994.  [name] RIGHT/LEFT HEART CATH AND CORONARY/GRAFT ANGIOGRAPHY N/A 08/14/2017   Procedure: RIGHT/LEFT HEART CATH AND CORONARY/GRAFT ANGIOGRAPHY;  Surgeon: Sherren Mocha, MD;  Location: [facility] CV LAB;  Service: Cardiovascular;  Laterality: N/A;  . TEE WITHOUT CARDIOVERSION N/A 09/17/2017   Procedure: TRANSESOPHAGEAL ECHOCARDIOGRAM (TEE);  Surgeon: Sherren Mocha, MD;  Location: [facility];  Service: Open Heart Surgery;  Laterality: N/A;        Home Medications    Prior to Admission medications  Medication Sig Start Date End Date Taking? Authorizing Provider  acyclovir (ZOVIRAX) 800 MG tablet Take 400 mg by mouth 2 (two) times daily.   Yes [provider]  allopurinol (ZYLOPRIM) 300 MG tablet Take 300 mg by mouth daily.   Yes [provider]  apixaban (ELIQUIS) 5 MG TABS tablet Take 1 tablet (5 mg total) by mouth 2 (two) times daily. 12/22/18 03/23/19 Yes Welborn, Ryan, DO  atorvastatin (LIPITOR) 80 MG tablet Take 1 tablet (80 mg total) by mouth at bedtime. 12/07/14  Yes Lelon Perla, MD  brimonidine (ALPHAGAN) 0.2 % ophthalmic solution Place 1 drop into both eyes 2 (two) times daily.   Yes [provider]  budesonide-formoterol (SYMBICORT) 80-4.5 MCG/ACT inhaler Inhale 1 puff into the lungs daily as needed ("for shortness of breath or wheezing- rinse mouth afterwards").  09/05/18  Yes [provider]   Cholecalciferol (VITAMIN D) 50 MCG (2000 UT) tablet Take 1 tablet by mouth daily. 02/13/19  Yes [provider]  digoxin (LANOXIN) 0.125 MG tablet Take 1 tablet (0.125 mg total) by mouth daily. 01/29/19  Yes Shirley, [place], DO  ferrous sulfate 325 (65 FE) MG tablet Take 1 tablet (325 mg total) by mouth daily with breakfast. 02/22/19  Yes Domenic Polite, MD  insulin aspart (NOVOLOG FLEXPEN) 100 UNIT/ML FlexPen Inject 2 Units into the skin See admin instructions. Inject 2 units into the skin at 9 AM in the morning as needed for a BGL >200   Yes [provider]  lansoprazole (PREVACID) 30 MG capsule Take 30 mg by mouth daily at 12 noon.   Yes [provider]  levalbuterol (XOPENEX) 0.63 MG/3ML nebulizer solution Take 3 mLs (0.63 mg total) by nebulization every 6 (six) hours as needed for wheezing or shortness of breath. Patient taking differently: Take 0.63 mg by nebulization 3 (three) times daily.  12/22/18  Yes Lurline Del, DO  Melatonin 5 MG TABS Take 5 mg by mouth at bedtime.   Yes [provider]  metFORMIN (GLUCOPHAGE-XR) 500 MG 24 hr tablet Take 500 mg by mouth at bedtime.  12/10/18  Yes [provider]  metoprolol tartrate (LOPRESSOR) 25 MG tablet Take 1 tablet (25 mg total) by mouth 2 (two) times daily. 02/22/19  Yes Domenic Polite, MD  Multiple Vitamin (MULTIVITAMIN WITH MINERALS) TABS Take 1 tablet by mouth daily.   Yes [provider]  NON FORMULARY Take 120 mLs by mouth See admin instructions. MedPass: Drink 120 ml's by mouth two times a day   Yes [provider]  OXYGEN Inhale 3 L/min into the lungs continuous. TO MAINTAIN SATS <90%   Yes [provider]  polyethylene glycol (MIRALAX / GLYCOLAX) 17 g packet Take 17 g by mouth 2 (two) times daily. 12/22/18  Yes Welborn, Ryan, DO  potassium chloride (KLOR-CON M15) 15 MEQ tablet Take 2 tablets (30 mEq total) by mouth 2 (two) times daily. 01/28/19  Yes Enid Derry, [place], DO   spironolactone (ALDACTONE) 25 MG tablet Take 25 mg by mouth daily.   Yes [provider]  tamsulosin (FLOMAX) 0.4 MG CAPS capsule Take 1 capsule (0.4 mg total) by mouth daily after supper. Patient taking differently: Take 0.4 mg by mouth every evening.  12/22/18  Yes Welborn, Ryan, DO  torsemide (DEMADEX) 20 MG tablet Take 2 tablets (40 mg total) by mouth 2 (two) times daily. 02/22/19  Yes Domenic Polite, MD  trolamine salicylate (ASPERCREME) 10 % cream Apply 1 application topically every 6 (six) hours as  needed for muscle pain.    Yes [provider]  umeclidinium bromide (INCRUSE ELLIPTA) 62.5 MCG/INH AEPB Inhale 1 puff into the lungs daily. 11/27/18  Yes Matilde Haymaker, MD    Family History Family History  Problem Relation Age of Onset  . Coronary artery disease Mother   . Heart disease Mother        Before age 35  . Hyperlipidemia Mother   . Hypertension Mother   . Heart attack Mother   . Coronary artery disease Father   . Heart disease Father        After age 75  . Heart attack Father   . Heart disease Sister        After age 41  . Heart disease Brother        After age 47  . Coronary artery disease Other        13 sibling, almost all have coronary disease and some with premature onset  . Heart disease Other        13 sibling, almost all have coronary disease and some with premature onset    Social History Social History   Tobacco Use  . Smoking status: Former Smoker    Quit date: 04/27/1993    Years since quitting: 25.9  . Smokeless tobacco: Never Used  Substance Use Topics  . Alcohol use: No    Alcohol/week: 0.0 standard drinks  . Drug use: No     Allergies   Bactrim [sulfamethoxazole-trimethoprim] and Levaquin [levofloxacin in d5w]   Review of Systems Review of Systems  Constitutional: Negative for chills and fever.  HENT: Negative for ear pain and sore throat.   Eyes: Negative for pain and visual disturbance.  Respiratory: Positive for  shortness of breath. Negative for cough.   Cardiovascular: Negative for chest pain and palpitations.  Gastrointestinal: Negative for abdominal pain and vomiting.  Genitourinary: Negative for dysuria and hematuria.  Musculoskeletal: Negative for arthralgias and back pain.  Skin: Negative for color change and rash.  Neurological: Negative for seizures and syncope.  All other systems reviewed and are negative.    Physical Exam Updated Vital Signs BP (!) 178/66   Pulse (!) 120   Temp 98.2 F (36.8 C) (Oral)   Resp (!) 29   Ht 5\' 11"  (9.509 m)   Wt 78.1 kg   SpO2 100%   BMI 24.02 kg/m   Physical Exam Vitals signs and nursing note reviewed.  Constitutional:      Appearance: He is well-developed.  HENT:     Head: Normocephalic and atraumatic.  Eyes:     Conjunctiva/sclera: Conjunctivae normal.  Neck:     Musculoskeletal: Neck supple.  Cardiovascular:     Rate and Rhythm: Normal rate and regular rhythm.     Heart sounds: No murmur.  Pulmonary:     Comments: No significant increased work of breathing but noted mild tachypnea, bilateral expiratory wheezing Abdominal:     Palpations: Abdomen is soft.     Tenderness: There is no abdominal tenderness.  Skin:    General: Skin is warm and dry.  Neurological:     Mental Status: He is alert.      ED Treatments / Results  Labs (all labs ordered are listed, but only abnormal results are displayed) Labs Reviewed  BASIC METABOLIC PANEL - Abnormal; Notable for the following components:      Result Value   Sodium 129 (*)    Chloride 88 (*)    Glucose, Bld  133 (*)    All other components within normal limits  CBC WITH DIFFERENTIAL/PLATELET - Abnormal; Notable for the following components:   RBC 3.29 (*)    Hemoglobin 8.8 (*)    HCT 28.7 (*)    RDW 17.7 (*)    All other components within normal limits  BRAIN NATRIURETIC PEPTIDE - Abnormal; Notable for the following components:   B Natriuretic Peptide 275.2 (*)    All other  components within normal limits  TROPONIN I (HIGH SENSITIVITY) - Abnormal; Notable for the following components:   Troponin I (High Sensitivity) 28 (*)    All other components within normal limits  SARS CORONAVIRUS 2 (TAT 6-24 HRS)  MRSA PCR SCREENING  BASIC METABOLIC PANEL  MAGNESIUM  CBC    EKG None  Radiology Dg Chest Portable 1 View  Result Date: 03/23/2019 CLINICAL DATA:  Shortness of breath EXAM: PORTABLE CHEST 1 VIEW COMPARISON:  02/17/2019 FINDINGS: Post sternotomy changes. Moderate left pleural effusion, increased compared to prior. Small right pleural effusion also increased. Mild cardiomegaly with vascular congestion and mild interstitial edema. Dense airspace disease at the lingula and left base. Valve prosthesis. IMPRESSION: 1. Bilateral pleural effusions, small on the right and moderate on the left. 2. Cardiomegaly with vascular congestion and mild interstitial edema 3. Dense atelectasis or pneumonia at the lingula and left base. Electronically Signed   By: Donavan Foil M.D.   On: 03/23/2019 18:32    Procedures Procedures (including critical care time)  Medications Ordered in ED Medications  sodium chloride flush (NS) 0.9 % injection 3 mL (has no administration in time range)  sodium chloride flush (NS) 0.9 % injection 3 mL (has no administration in time range)  0.9 %  sodium chloride infusion (has no administration in time range)  acetaminophen (TYLENOL) tablet 650 mg (has no administration in time range)  ondansetron (ZOFRAN) injection 4 mg (has no administration in time range)  furosemide (LASIX) injection 60 mg (has no administration in time range)  potassium chloride 20 MEQ/15ML (10%) solution 20 mEq (has no administration in time range)  furosemide (LASIX) injection 20 mg (has no administration in time range)  allopurinol (ZYLOPRIM) tablet 300 mg (has no administration in time range)  atorvastatin (LIPITOR) tablet 80 mg (has no administration in time range)   brimonidine (ALPHAGAN) 0.2 % ophthalmic solution 1 drop (has no administration in time range)  mometasone-formoterol (DULERA) 100-5 MCG/ACT inhaler 2 puff (has no administration in time range)  digoxin (LANOXIN) tablet 0.125 mg (has no administration in time range)  pantoprazole (PROTONIX) EC tablet 20 mg (has no administration in time range)  Melatonin TABS 6 mg (has no administration in time range)  metoprolol tartrate (LOPRESSOR) tablet 25 mg (has no administration in time range)  spironolactone (ALDACTONE) tablet 25 mg (has no administration in time range)  tamsulosin (FLOMAX) capsule 0.4 mg (has no administration in time range)  umeclidinium bromide (INCRUSE ELLIPTA) 62.5 MCG/INH 1 puff (has no administration in time range)  insulin aspart (novoLOG) injection 0-9 Units (has no administration in time range)  albuterol (VENTOLIN HFA) 108 (90 Base) MCG/ACT inhaler 1-2 puff (has no administration in time range)  albuterol (VENTOLIN HFA) 108 (90 Base) MCG/ACT inhaler 4 puff (4 puffs Inhalation Given 03/23/19 2012)  methylPREDNISolone sodium succinate (SOLU-MEDROL) 125 mg/2 mL injection 125 mg (125 mg Intravenous Given 03/23/19 2011)  furosemide (LASIX) injection 40 mg (40 mg Intravenous Given 03/23/19 2011)     Initial Impression / Assessment and Plan / ED Course  I have reviewed the triage vital signs and the nursing notes.  Pertinent labs & imaging results that were available during my care of the patient were reviewed by me and considered in my medical decision making (see chart for details).  Clinical Course as of Mar 22 2254  Mon Mar 23, 2019  1800 Complete initial assessment   [RD]  1924 Discussed with wife, significant increased work of breathing since yesterday, has chronic cough, no significant change in cough, no other symptoms   [RD]  1934 Recheck patient, updated on results, remains tachypneic, will give dose of Lasix, awaiting troponin, then likely admit   [RD]     Clinical Course User Index [RD] Lucrezia Starch, MD       85 year old male presents ER with shortness of breath.  On exam noted to have significant tachypnea, wheezing but no desaturations on pulse ox on home oxygen.  Patient has extensive past medical history most notable for A. fib, COPD, diabetes, diastolic heart failure.  Chest x-ray is concerning for bilateral pleural effusion, pulmonary congestion, questionable infiltrate.  No change in cough, no fever, no leukocytosis, doubt pneumonia, favor fluid overload state.  Also suspect component of COPD.  Will admit patient for further management.  Started on IV diuretic therapy, gave albuterol inhaler.  Sent for COVID-19 test.  Dr. Posey Pronto excepting to hospitalist service.  Final Clinical Impressions(s) / ED Diagnoses   Final diagnoses:  Acute respiratory failure, unspecified whether with hypoxia or hypercapnia (HCC)  COPD exacerbation (HCC)  Acute on chronic diastolic heart failure [facility])    ED Discharge Orders    None       Lucrezia Starch, MD 03/23/19 2255

## 2019-03-23 NOTE — ED Notes (Signed)
ED TO INPATIENT HANDOFF REPORT  ED Nurse Name and Phone #: Bella Kennedy [phone]  S Name/Age/Gender [name] 85 y.o. male Room/Bed: 042C/042C  Code Status   Code Status: DNR  Home/SNF/Other Home Patient oriented to: self Is this baseline? Yes   Triage Complete: Triage complete  Chief Complaint [facility]  Triage Note PT states sob since last night. Pt is on home O2 3L, per norm with sats of 96% with rr of 22.  Afib  With hr 80, hx of same.  CBG 146.  Pt c/o pain to L great toe.  20 G IV R FA.   Allergies Allergies  Allergen Reactions  . Bactrim [Sulfamethoxazole-Trimethoprim] Other (See Comments)    "Allergic," per MAR  . Levaquin [Levofloxacin In D5w] Diarrhea and Other (See Comments)    "Allergic," per MAR    Level of Care/Admitting Diagnosis ED Disposition    ED Disposition Condition [place]: [place] [[id number]]  Level of Care: Progressive [102]  Covid Evaluation: Asymptomatic Screening Protocol (No Symptoms)  Diagnosis: Acute on chronic diastolic (congestive) heart failure [facility]) [[id number]]  Admitting Physician: Lenore Cordia [[id number]]  Attending Physician: Lenore Cordia [[id number]]  Estimated length of stay: past midnight tomorrow  Certification:: I certify this patient will need inpatient services for at least 2 midnights  PT Class (Do Not Modify): Inpatient [101]  PT Acc Code (Do Not Modify): Private [1]       B Medical/Surgery History Past Medical History:  Diagnosis Date  . Acute myocardial infarction, unspecified site, initial episode of care   . Anemia   . Anxiety   . Aortic stenosis    . Atrial fibrillation 06/27/2013  . Atrial fibrillation ([place])    a. permanent, on Coumadin for anticoagulation  . BPPV (benign paroxysmal positional vertigo) 2015  . CAD (coronary artery disease)    a. s/p CABG in 1994 b. cath in 2015 showing normal LM, 100% LCx and RCA stenosis with 50-60% prox LAD stenosis and 100% mid-LAD  stenosis; patent sequential SVG-OM2-OM3 and patent LIMA-LAD with PTCA of distal LAD via LIMA graft performed at that time  . Carotid stenosis   . CAROTID STENOSIS- moderate 09/09/2008   Qualifier: Diagnosis of  By: Burnett Kanaris    . Cellulitis 11/15/2018  . Chest pain with moderate risk of acute coronary syndrome 06/27/2013  . CHF (congestive heart failure) ([place])   . Chronic anticoagulation 06/27/2013  . COPD (chronic obstructive pulmonary disease) ([place])   . Depression   . Diabetes mellitus without complication ([place])    FASTING 98-120S  . GERD (gastroesophageal reflux disease)   . Gout attack- on steroid dose pack 06/27/2013  . History of blood transfusion   . History of peptic ulcer disease   . Hyperlipidemia   . Hypertension   . Left atrial severely dilated 01/19/2019  . Myocardial infarction ([place])    X2  . NSTEMI (non-ST elevated myocardial infarction) ([place]) 08/2013  . 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 PEPTIC ULCER DISEASE, HX OF 09/09/2008   Qualifier: Diagnosis of  By: Burnett Kanaris    . PERIPHERAL VASCULAR DISEASE 09/09/2008   Qualifier: Diagnosis of  By: Burnett Kanaris    . Peripheral vascular disease ([place])   . Pneumonia    HX OF  . Presence of indwelling Foley catheter 01/20/2019  . Pressure injury of skin 12/17/2018  . Renal artery stenosis ([place])   .  S/P thoracentesis   . Severe aortic stenosis 09/17/2017  . Sleep apnea    OXYGEN AT NIGHT 2L [place]  . Stress fracture of calcaneus 2018   Dr [facility])  . Tachycardia- beta blocker increased 06/26/2013  . UNSPECIFIED ANEMIA 09/09/2008   Qualifier: Diagnosis of  By: Burnett Kanaris    . UNSPECIFIED CEREBROVASCULAR DISEASE 09/09/2008   Qualifier: Diagnosis of  By: Burnett Kanaris    . Unstable angina ([place]) 08/31/2013  . Vertigo 06/27/2013   Past Surgical History:  Procedure Laterality Date  . AORTIC ARCH ANGIOGRAPHY N/A 08/14/2017   Procedure: AORTIC  ARCH ANGIOGRAPHY;  Surgeon: Sherren Mocha, MD;  Location: [place] CV [facility];  Service: Cardiovascular;  Laterality: N/A;  . BOWEL RESECTION  07/2013   Bowel perforation  . CARDIAC CATHETERIZATION    . CAROTID ENDARTERECTOMY Right 2014   Curt Jews, MD  . COLON SURGERY    . CORONARY ARTERY BYPASS GRAFT  1994  . ENDARTERECTOMY Right 01/05/2013   Procedure: ENDARTERECTOMY CAROTID;  Surgeon: Rosetta Posner, MD;  Location: [place];  Service: Vascular;  Laterality: Right;  . INCISION AND DRAINAGE Left 11/16/2018   Procedure: INCISION AND DRAINAGE LEFT FOREARM/ARM;  Surgeon: Leanora Cover, MD;  Location: [place];  Service: Orthopedics;  Laterality: Left;  . KNEE SURGERY  08/2003   left knee/ARTHROSCOPIC  . LEFT HEART CATHETERIZATION WITH CORONARY/GRAFT ANGIOGRAM N/A 09/01/2013   Procedure: LEFT HEART CATHETERIZATION WITH Beatrix Fetters;  Surgeon: Sinclair Grooms, MD;  Location: [facility] CATH LAB;  Service: Cardiovascular;  Laterality: N/A;  . PATCH ANGIOPLASTY Right 01/05/2013   Procedure: PATCH ANGIOPLASTY;  Surgeon: Rosetta Posner, MD;  Location: [place];  Service: Vascular;  Laterality: Right;  . PERCUTANEOUS CORONARY STENT INTERVENTION (PCI-S)  09/01/2013   Procedure: PERCUTANEOUS CORONARY STENT INTERVENTION (PCI-S);  Surgeon: Sinclair Grooms, MD;  Location: [facility] CATH LAB;  Service: Cardiovascular;;  . removal of bleeding ulcer  1994  . RENAL ARTERY STENT     stenting of the left renal artery as well as a cutting balloon angioplasty for treatment of in-stent restenosis coronary artery bypass grafting in 1994.  [name] RIGHT/LEFT HEART CATH AND CORONARY/GRAFT ANGIOGRAPHY N/A 08/14/2017   Procedure: RIGHT/LEFT HEART CATH AND CORONARY/GRAFT ANGIOGRAPHY;  Surgeon: Sherren Mocha, MD;  Location: [place] CV [facility];  Service: Cardiovascular;  Laterality: N/A;  . TEE WITHOUT CARDIOVERSION N/A 09/17/2017   Procedure: TRANSESOPHAGEAL ECHOCARDIOGRAM (TEE);  Surgeon: Sherren Mocha, MD;  Location: [place];  Service: Open  Heart Surgery;  Laterality: N/A;     A IV Location/Drains/Wounds Patient Lines/Drains/Airways Status   Active Line/Drains/Airways    Name:   Placement date:   Placement time:   Site:   Days:   Urethral Catheter Corinna Lines, NT Double-lumen 18 Fr.   02/20/19    2013    Double-lumen   31   Pressure Injury 02/17/19 Toe (Comment  which one) Anterior;Left deep purple   02/17/19    2000     34   Wound / Incision (Open or Dehisced) 11/19/18 Other (Comment) Arm Left left dorsal cellulitic wound post I and D   11/19/18    -    Arm   124   Wound / Incision (Open or Dehisced) 02/17/19 Non-pressure wound Heel Right dry, black   02/17/19    2000    Heel   34          Intake/Output Last 24 hours  Intake/Output Summary (Last 24 hours) at 03/23/2019  2132 Last data filed at 03/23/2019 2023 Gross per 24 hour  Intake -  Output 150 ml  Net -150 ml    Labs/Imaging Results for orders placed or performed during the hospital encounter of 03/23/19 (from the past 48 hour(s))  Basic metabolic panel     Status: Abnormal   Collection Time: 03/23/19  6:15 PM  Result Value Ref Range   Sodium 129 (L) 135 - 145 mmol/L   Potassium 4.2 3.5 - 5.1 mmol/L   Chloride 88 (L) 98 - 111 mmol/L   CO2 29 22 - 32 mmol/L   Glucose, Bld 133 (H) 70 - 99 mg/dL   BUN 10 8 - 23 mg/dL   Creatinine, Ser 1.02 0.61 - 1.24 mg/dL   Calcium 8.9 8.9 - 10.3 mg/dL   GFR calc non Af Amer >60 >60 mL/min   GFR calc Af Amer >60 >60 mL/min   Anion gap 12 5 - 15    Comment: Performed at [facility], [place] [address], [place], [place] [address]  CBC WITH DIFFERENTIAL     Status: Abnormal   Collection Time: 03/23/19  6:15 PM  Result Value Ref Range   WBC 7.7 4.0 - 10.5 K/uL   RBC 3.29 (L) 4.22 - 5.81 MIL/uL   Hemoglobin 8.8 (L) 13.0 - 17.0 g/dL   HCT 28.7 (L) 39.0 - 52.0 %   MCV 87.2 80.0 - 100.0 fL   MCH 26.7 26.0 - 34.0 pg   MCHC 30.7 30.0 - 36.0 g/dL   RDW 17.7 (H) 11.5 - 15.5 %   Platelets 318 150 - 400 K/uL   nRBC  0.0 0.0 - 0.2 %   Neutrophils Relative % 72 %   Neutro Abs 5.5 1.7 - 7.7 K/uL   Lymphocytes Relative 11 %   Lymphs Abs 0.9 0.7 - 4.0 K/uL   Monocytes Relative 12 %   Monocytes Absolute 0.9 0.1 - 1.0 K/uL   Eosinophils Relative 4 %   Eosinophils Absolute 0.3 0.0 - 0.5 K/uL   Basophils Relative 1 %   Basophils Absolute 0.1 0.0 - 0.1 K/uL   Immature Granulocytes 0 %   Abs Immature Granulocytes 0.02 0.00 - 0.07 K/uL    Comment: Performed at [facility], [address], [place], [place] [address]  Brain natriuretic peptide     Status: Abnormal   Collection Time: 03/23/19  6:15 PM  Result Value Ref Range   B Natriuretic Peptide 275.2 (H) 0.0 - 100.0 pg/mL    Comment: Performed at [place] [address] [place], [place] [address]  Troponin I (High Sensitivity)     Status: Abnormal   Collection Time: 03/23/19  7:22 PM  Result Value Ref Range   Troponin I (High Sensitivity) 28 (H) <18 ng/L    Comment: (NOTE) Elevated high sensitivity troponin I (hsTnI) values and significant  changes across serial measurements may suggest ACS but many other  chronic and acute conditions are known to elevate hsTnI results.  Refer to the "Links" section for chest pain algorithms and additional  guidance. Performed at [facility], [place] [address], [place], [place] [address]    Dg Chest Portable 1 View  Result Date: 03/23/2019 CLINICAL DATA:  Shortness of breath EXAM: PORTABLE CHEST 1 VIEW COMPARISON:  02/17/2019 FINDINGS: Post sternotomy changes. Moderate left pleural effusion, increased compared to prior. Small right pleural effusion also increased. Mild cardiomegaly with vascular congestion and mild interstitial edema. Dense airspace disease  at the lingula and left base. Valve prosthesis. IMPRESSION: 1. Bilateral pleural effusions, small on the right and moderate on the left. 2. Cardiomegaly with vascular congestion and mild interstitial edema 3. Dense atelectasis or pneumonia at  the lingula and left base. Electronically Signed   By: Donavan Foil M.D.   On: 03/23/2019 18:32    Pending Labs Unresulted Labs (From admission, onward)    Start     Ordered   03/24/19 0160  Basic metabolic panel  Daily,   R     03/23/19 2111   03/24/19 0500  Magnesium  Tomorrow morning,   R     03/23/19 2111   03/24/19 0500  CBC  Tomorrow morning,   R     03/23/19 2111   03/23/19 1913  SARS CORONAVIRUS 2 (TAT 6-24 HRS) Nasopharyngeal Nasopharyngeal Swab  (Asymptomatic/Tier 2 Patients Labs)  Once,   STAT    Question Answer Comment  Is this test for diagnosis or screening Screening   Symptomatic for COVID-19 as defined by CDC No   Hospitalized for COVID-19 No   Admitted to ICU for COVID-19 No   Previously tested for COVID-19 Yes   Resident in a congregate (group) care setting No   Employed in healthcare setting No      03/23/19 1912          Vitals/Pain Today's Vitals   03/23/19 2015 03/23/19 2030 03/23/19 2045 03/23/19 2100  BP:      Pulse:  (!) 55 77 (!) 120  Resp: (!) 30 (!) 27 15 (!) 29  SpO2:  100% 100% 100%  Weight:      Height:        Isolation Precautions No active isolations  Medications Medications  sodium chloride flush (NS) 0.9 % injection 3 mL (has no administration in time range)  sodium chloride flush (NS) 0.9 % injection 3 mL (has no administration in time range)  0.9 %  sodium chloride infusion (has no administration in time range)  acetaminophen (TYLENOL) tablet 650 mg (has no administration in time range)  ondansetron (ZOFRAN) injection 4 mg (has no administration in time range)  furosemide (LASIX) injection 60 mg (has no administration in time range)  potassium chloride 20 MEQ/15ML (10%) solution 20 mEq (has no administration in time range)  furosemide (LASIX) injection 20 mg (has no administration in time range)  allopurinol (ZYLOPRIM) tablet 300 mg (has no administration in time range)  atorvastatin (LIPITOR) tablet 80 mg (has no  administration in time range)  brimonidine (ALPHAGAN) 0.2 % ophthalmic solution 1 drop (has no administration in time range)  mometasone-formoterol (DULERA) 100-5 MCG/ACT inhaler 2 puff (has no administration in time range)  digoxin (LANOXIN) tablet 0.125 mg (has no administration in time range)  pantoprazole (PROTONIX) EC tablet 20 mg (has no administration in time range)  Melatonin TABS 5 mg (has no administration in time range)  metoprolol tartrate (LOPRESSOR) tablet 25 mg (has no administration in time range)  spironolactone (ALDACTONE) tablet 25 mg (has no administration in time range)  tamsulosin (FLOMAX) capsule 0.4 mg (has no administration in time range)  umeclidinium bromide (INCRUSE ELLIPTA) 62.5 MCG/INH 1 puff (has no administration in time range)  insulin aspart (novoLOG) injection 0-9 Units (has no administration in time range)  albuterol (VENTOLIN HFA) 108 (90 Base) MCG/ACT inhaler 1-2 puff (has no administration in time range)  albuterol (VENTOLIN HFA) 108 (90 Base) MCG/ACT inhaler 4 puff (4 puffs Inhalation Given 03/23/19 2012)  methylPREDNISolone sodium succinate (  SOLU-MEDROL) 125 mg/2 mL injection 125 mg (125 mg Intravenous Given 03/23/19 2011)  furosemide (LASIX) injection 40 mg (40 mg Intravenous Given 03/23/19 2011)    Mobility walks with device     Focused Assessments Cardiac Assessment Handoff:  Cardiac Rhythm: Bundle branch block Lab Results  Component Value Date   TROPONINI 0.07 ([place]) 11/22/2018   No results found for: DDIMER Does the Patient currently have chest pain? No     R Recommendations: See Admitting Provider Note  Report given to:   Additional Notes: n/a

## 2019-03-24 DIAGNOSIS — I4821 Permanent atrial fibrillation: Secondary | ICD-10-CM

## 2019-03-24 DIAGNOSIS — J411 Mucopurulent chronic bronchitis: Secondary | ICD-10-CM

## 2019-03-24 DIAGNOSIS — E1159 Type 2 diabetes mellitus with other circulatory complications: Secondary | ICD-10-CM

## 2019-03-24 DIAGNOSIS — Z978 Presence of other specified devices: Secondary | ICD-10-CM

## 2019-03-24 DIAGNOSIS — D638 Anemia in other chronic diseases classified elsewhere: Secondary | ICD-10-CM

## 2019-03-24 DIAGNOSIS — J90 Pleural effusion, not elsewhere classified: Secondary | ICD-10-CM

## 2019-03-24 DIAGNOSIS — I5033 Acute on chronic diastolic (congestive) heart failure: Secondary | ICD-10-CM

## 2019-03-24 DIAGNOSIS — E1165 Type 2 diabetes mellitus with hyperglycemia: Secondary | ICD-10-CM

## 2019-03-24 DIAGNOSIS — E871 Hypo-osmolality and hyponatremia: Secondary | ICD-10-CM

## 2019-03-24 DIAGNOSIS — I10 Essential (primary) hypertension: Secondary | ICD-10-CM

## 2019-03-24 LAB — GLUCOSE, CAPILLARY
Glucose-Capillary: 159 mg/dL — ABNORMAL HIGH (ref 70–99)
Glucose-Capillary: 191 mg/dL — ABNORMAL HIGH (ref 70–99)
Glucose-Capillary: 148 mg/dL — ABNORMAL HIGH (ref 70–99)
Glucose-Capillary: 153 mg/dL — ABNORMAL HIGH (ref 70–99)

## 2019-03-24 LAB — BASIC METABOLIC PANEL
Anion gap: 10 (ref 5–15)
BUN: 10 mg/dL (ref 8–23)
CO2: 31 mmol/L (ref 22–32)
Calcium: 9 mg/dL (ref 8.9–10.3)
Chloride: 90 mmol/L — ABNORMAL LOW (ref 98–111)
Creatinine, Ser: 0.93 mg/dL (ref 0.61–1.24)
GFR calc Af Amer: >60 mL/min (ref >60)
GFR calc non Af Amer: >60 mL/min (ref >60)
Glucose, Bld: 158 mg/dL — ABNORMAL HIGH (ref 70–99)
Potassium: 4.7 mmol/L (ref 3.5–5.1)
Sodium: 131 mmol/L — ABNORMAL LOW (ref 135–145)

## 2019-03-24 LAB — FOLATE: Folate: 21.7 ng/mL (ref >5.9)

## 2019-03-24 LAB — CBC
HCT: 27.6 % — ABNORMAL LOW (ref 39.0–52.0)
Hemoglobin: 8.7 g/dL — ABNORMAL LOW (ref 13.0–17.0)
MCH: 26.8 pg (ref 26.0–34.0)
MCHC: 31.5 g/dL (ref 30.0–36.0)
MCV: 84.9 fL (ref 80.0–100.0)
Platelets: 297 10*3/uL (ref 150–400)
RBC: 3.25 MIL/uL — ABNORMAL LOW (ref 4.22–5.81)
RDW: 17.5 % — ABNORMAL HIGH (ref 11.5–15.5)
WBC: 7 10*3/uL (ref 4.0–10.5)
nRBC: 0 % (ref 0.0–0.2)

## 2019-03-24 LAB — RETICULOCYTES
Immature Retic Fract: 26 % — ABNORMAL HIGH (ref 2.3–15.9)
RBC.: 3.39 MIL/uL — ABNORMAL LOW (ref 4.22–5.81)
Retic Count, Absolute: 110.9 10*3/uL (ref 19.0–186.0)
Retic Ct Pct: 3.3 % — ABNORMAL HIGH (ref 0.4–3.1)

## 2019-03-24 LAB — IRON AND TIBC
Iron: 19 ug/dL — ABNORMAL LOW (ref 45–182)
Saturation Ratios: 5 % — ABNORMAL LOW (ref 17.9–39.5)
TIBC: 374 ug/dL (ref 250–450)
UIBC: 355 ug/dL

## 2019-03-24 LAB — VITAMIN B12: Vitamin B-12: 830 pg/mL (ref 180–914)

## 2019-03-24 LAB — MRSA PCR SCREENING: MRSA by PCR: NEGATIVE

## 2019-03-24 LAB — FERRITIN: Ferritin: 49 ng/mL (ref 24–336)

## 2019-03-24 LAB — MAGNESIUM: Magnesium: 2.1 mg/dL (ref 1.7–2.4)

## 2019-03-24 LAB — SARS CORONAVIRUS 2 (TAT 6-24 HRS): SARS Coronavirus 2: NEGATIVE

## 2019-03-24 MED ORDER — TRAMADOL HCL 50 MG PO TABS
50.0000 mg | ORAL_TABLET | Freq: Once | ORAL | Status: AC
  Administered 2019-03-24: 50 mg via ORAL
  Filled 2019-03-24: qty 1

## 2019-03-24 MED ORDER — APIXABAN 5 MG PO TABS
5.0000 mg | ORAL_TABLET | Freq: Two times a day (BID) | ORAL | Status: DC
  Administered 2019-03-24 – 2019-03-26 (×5): 5 mg via ORAL
  Filled 2019-03-24 (×5): qty 1

## 2019-03-24 MED ORDER — SODIUM CHLORIDE 0.9 % IV SOLN
510.0000 mg | Freq: Once | INTRAVENOUS | Status: AC
  Administered 2019-03-24: 510 mg via INTRAVENOUS
  Filled 2019-03-24: qty 17

## 2019-03-24 MED ORDER — CHLORHEXIDINE GLUCONATE CLOTH 2 % EX PADS
6.0000 | MEDICATED_PAD | Freq: Every day | CUTANEOUS | Status: DC
  Administered 2019-03-24 – 2019-03-26 (×3): 6 via TOPICAL

## 2019-03-24 NOTE — Plan of Care (Signed)

## 2019-03-24 NOTE — Progress Notes (Signed)
PROGRESS NOTE  [name] NTZ:[id number] DOB: 1932/04/17   PCP: Nicoletta Dress, MD  Patient is from: Home  DOA: 03/23/2019 LOS: 1  Brief Narrative / Interim history: 85 year old male with history of CAD/CABG, chronic diastolic CHF, permanent A. fib on Eliquis, AS/TAVR, pulmonary hypertension, COPD on 3 L, DM-2, HTN, HLD, anemia of chronic disease, gout, chronic retention with chronic indwelling Foley catheter and OSA presenting with dyspnea, orthopnea and PND and admitted for acute on chronic CHF.  Unclear cause of his exacerbation.  He says his wife is a retired Marine scientist and watchees his salt and fluid intake.  He denies NSAID use. He reports good compliance with medications including his diuretics.  In ED, slightly hypertensive.  Hgb 8.8.  Sodium 129.  BNP 275.  High-sensitivity troponin 28.  CXR increased moderate left pleural effusion, small right pleural effusion and mild interstitial edema.  Received IV Lasix, IV Solu-Medrol and breathing treatments and admitted for CHF exacerbation.  COVID-19 negative.  Subjective: Patient reports improvement in his breathing.  He denies chest pain, palpitation or dizziness.  Denies GI symptoms.  He says his wife is a retired Marine scientist and wife his salt and fluid intake.  He denies NSAID use.  His wife is a retired Marine scientist.   Objective: Vitals:   03/24/19 0801 03/24/19 0900 03/24/19 1029 03/24/19 1046  BP:   (!) 147/63   Pulse: 78 72 65 68  Resp: (!) 22 (!) 23 (!) 23   Temp:   97.7 F (36.5 C)   TempSrc:   Oral   SpO2: 98% 97% 99%   Weight:      Height:        Intake/Output Summary (Last 24 hours) at 03/24/2019 1235 Last data filed at 03/24/2019 1137 Gross per 24 hour  Intake 3 ml  Output 1850 ml  Net -1847 ml   Filed Weights   03/23/19 1805 03/24/19 0401  Weight: 78.1 kg 83.5 kg    Examination:  GENERAL: No acute distress.  Appears well.  HEENT: MMM.  Vision and hearing grossly intact.  NECK: Supple.  No apparent JVD.  RESP:   No IWOB.  Fair air movement bilaterally.  Bibasilar fine crackles. CVS: IR.  Normal rate.[name] Heart sounds normal.  ABD/GI/GU: Bowel sounds present. Soft. Non tender.  MSK/EXT:  Moves extremities. No apparent deformity or edema.  SKIN: no apparent skin lesion or wound NEURO: Awake, alert and oriented appropriately.  No gross deficit.  PSYCH: Calm. Normal affect.   Assessment & Plan: Acute on chronic diastolic CHF with bilateral pleural effusion, left greater than right: Has had dyspnea, orthopnea and PND on admission.  No significant edema.  BNP elevated.  CXR concerning for CHF. Unclear cause of his exacerbation.  Reportedly good compliance.  Does not take NSAIDs.  Seems to be responding to IV Lasix.  Had about 850 cc output overnight.  Renal function better. -Continue IV Lasix 60 mg twice daily -Daily weight, intake output and renal function. -Sodium and fluid restriction -Continue digoxin and spironolactone.  Bilateral pleural effusion: Moderate on the left and small on the right.  Likely due to CHF. -Diuretics as above -May consider thoracocentesis if no improvement with diuretics. -We will obtain repeat CXR tomorrow.  Chronic COPD/chronic respiratory failure: On 3 L at baseline. -Continue Dulera, Incruse Ellipta and as needed albuterol's.  Permanent atrial fibrillation: Rate controlled. -Continue Lopressor -Resume Eliquis.  Hyponatremia: Likely dilutional from CHF.  Improving. -Continue Lasix  History of CAD/CABG: Stable.  No  anginal symptoms. -Continue BB, statin and Eliquis.  DM-2 with mild hyperglycemia: CBG was in fair range. -Continue current regimen -Check A1c  Anemia of chronic disease: Hgb 8.7 (baseline) -Continue monitoring -Check anemia panel  Chronic urinary retention with chronic indwelling Foley catheter: Exchanged 03/22/2019 per patient -Continue.  Bilateral first toe wounds: Stable.  Follows wound care outpatient.  No signs of infection. -Appreciate  wound care input.  Pressure Injury 02/17/19 Toe (Comment  which one) Anterior;Left;Right deep purple (Active)  02/17/19 2000  Location: Toe (Comment  which one) (great toe)  Location Orientation: Anterior;Left;Right  Staging:   Wound Description (Comments): deep purple  Present on Admission: Yes     DVT prophylaxis: On Eliquis Code Status: DNR/DNI Family Communication: Patient and/or RN. Available if any question. Disposition Plan: Remains inpatient for adequate diuresis.  Still with bibasilar crackles. Consultants: None  Procedures:  None  Microbiology summarized: [id number] negative. MRSA PCR negative.  Sch Meds:  Scheduled Meds: . allopurinol  300 mg Oral Daily  . atorvastatin  80 mg Oral QHS  . brimonidine  1 drop Both Eyes BID  . Chlorhexidine Gluconate Cloth  6 each Topical Daily  . digoxin  0.125 mg Oral Daily  . furosemide  60 mg Intravenous Q12H  . insulin aspart  0-9 Units Subcutaneous TID WC  . Melatonin  6 mg Oral QHS  . metoprolol tartrate  25 mg Oral BID  . mometasone-formoterol  2 puff Inhalation BID  . pantoprazole  20 mg Oral Daily  . potassium chloride  20 mEq Oral BID  . sodium chloride flush  3 mL Intravenous Q12H  . spironolactone  25 mg Oral Daily  . tamsulosin  0.4 mg Oral QPM  . umeclidinium bromide  1 puff Inhalation Daily   Continuous Infusions: . sodium chloride     PRN Meds:.sodium chloride, acetaminophen, albuterol, ondansetron (ZOFRAN) IV, sodium chloride flush  Antimicrobials: Anti-infectives (From admission, onward)   None       I have personally reviewed the following labs and images: CBC: Recent Labs  Lab 03/23/19 1815 03/24/19 0211  WBC 7.7 7.0  NEUTROABS 5.5  --   HGB 8.8* 8.7*  HCT 28.7* 27.6*  MCV 87.2 84.9  PLT 318 297   BMP &GFR Recent Labs  Lab 03/23/19 1815 03/24/19 0211  NA 129* 131*  K 4.2 4.7  CL 88* 90*  CO2 29 31  GLUCOSE 133* 158*  BUN 10 10  CREATININE 1.02 0.93  CALCIUM 8.9 9.0  MG  --   2.1   Estimated Creatinine Clearance: 61.9 mL/min (by C-G formula based on SCr of 0.93 mg/dL). Liver & Pancreas: No results for input(s): AST, ALT, ALKPHOS, BILITOT, PROT, ALBUMIN in the last 168 hours. No results for input(s): LIPASE, AMYLASE in the last 168 hours. No results for input(s): AMMONIA in the last 168 hours. Diabetic: No results for input(s): HGBA1C in the last 72 hours. Recent Labs  Lab 03/23/19 2352 03/24/19 0737 03/24/19 1029  GLUCAP 165* 159* 191*   Cardiac Enzymes: No results for input(s): CKTOTAL, CKMB, CKMBINDEX, TROPONINI in the last 168 hours. No results for input(s): PROBNP in the last 8760 hours. Coagulation Profile: No results for input(s): INR, PROTIME in the last 168 hours. Thyroid Function Tests: No results for input(s): TSH, T4TOTAL, FREET4, T3FREE, THYROIDAB in the last 72 hours. Lipid Profile: No results for input(s): CHOL, HDL, LDLCALC, TRIG, CHOLHDL, LDLDIRECT in the last 72 hours. Anemia Panel: Recent Labs    03/24/19 0719  VITAMINB12  830  FOLATE 21.7  FERRITIN 49  TIBC 374  IRON 19*  RETICCTPCT 3.3*   Urine analysis:    Component Value Date/Time   COLORURINE AMBER (A) 01/19/2019 1647   APPEARANCEUR CLOUDY (A) 01/19/2019 1647   LABSPEC 1.012 01/19/2019 1647   PHURINE 9.0 (H) 01/19/2019 1647   GLUCOSEU NEGATIVE 01/19/2019 1647   HGBUR MODERATE (A) 01/19/2019 1647   BILIRUBINUR NEGATIVE 01/19/2019 1647   KETONESUR NEGATIVE 01/19/2019 1647   PROTEINUR 100 (A) 01/19/2019 1647   UROBILINOGEN 0.2 12/23/2012 1026   NITRITE POSITIVE (A) 01/19/2019 1647   LEUKOCYTESUR MODERATE (A) 01/19/2019 1647   Sepsis Labs: Invalid input(s): PROCALCITONIN, [place]  Microbiology: Recent Results (from the past 240 hour(s))  SARS CORONAVIRUS 2 (TAT 6-24 HRS) Nasopharyngeal Nasopharyngeal Swab     Status: None   Collection Time: 03/23/19  7:13 PM   Specimen: Nasopharyngeal Swab  Result Value Ref Range Status   SARS Coronavirus 2 NEGATIVE  NEGATIVE Final    Comment: (NOTE) SARS-CoV-2 target nucleic acids are NOT DETECTED. The SARS-CoV-2 RNA is generally detectable in upper and lower respiratory specimens during the acute phase of infection. Negative results do not preclude SARS-CoV-2 infection, do not rule out co-infections with other pathogens, and should not be used as the sole basis for treatment or other patient management decisions. Negative results must be combined with clinical observations, patient history, and epidemiological information. The expected result is Negative. Fact Sheet for Patients: SugarRoll.be Fact Sheet for Healthcare Providers: https://www.woods-mathews.com/ This test is not yet approved or cleared by the [place] FDA and  has been authorized for detection and/or diagnosis of SARS-CoV-2 by FDA under an Emergency Use Authorization (EUA). This EUA will remain  in effect (meaning this test can be used) for the duration of the COVID-19 declaration under Section 56 4(b)(1) of the Act, 21 U.S.C. section 360bbb-3(b)(1), unless the authorization is terminated or revoked sooner. Performed at [facility], [place] [address] [place], [place] [address]   MRSA PCR Screening     Status: None   Collection Time: 03/23/19 10:25 PM   Specimen: Nasopharyngeal  Result Value Ref Range Status   MRSA by PCR NEGATIVE NEGATIVE Final    Comment:        The GeneXpert MRSA Assay (FDA approved for NASAL specimens only), is one component of a comprehensive MRSA colonization surveillance program. It is not intended to diagnose MRSA infection nor to guide or monitor treatment for MRSA infections. Performed at [facility], [place] [address] [place], [place] [address]     Radiology Studies: Dg Chest Portable 1 View  Result Date: 03/23/2019 CLINICAL DATA:  Shortness of breath EXAM: PORTABLE CHEST 1 VIEW COMPARISON:  02/17/2019 FINDINGS: Post sternotomy  changes. Moderate left pleural effusion, increased compared to prior. Small right pleural effusion also increased. Mild cardiomegaly with vascular congestion and mild interstitial edema. Dense airspace disease at the lingula and left base. Valve prosthesis. IMPRESSION: 1. Bilateral pleural effusions, small on the right and moderate on the left. 2. Cardiomegaly with vascular congestion and mild interstitial edema 3. Dense atelectasis or pneumonia at the lingula and left base. Electronically Signed   By: Donavan Foil M.D.   On: 03/23/2019 18:32    35 minutes with more than 50% spent in reviewing records, counseling patient and coordinating care.  Javarius Tsosie T. Lavina  If 7PM-7AM, please contact night-coverage [email or web address] Password [facility] 03/24/2019, 12:35 PM

## 2019-03-25 ENCOUNTER — Inpatient Hospital Stay (HOSPITAL_COMMUNITY): Payer: Medicare Other

## 2019-03-25 LAB — BASIC METABOLIC PANEL
Anion gap: 10 (ref 5–15)
BUN: 17 mg/dL (ref 8–23)
CO2: 30 mmol/L (ref 22–32)
Calcium: 9 mg/dL (ref 8.9–10.3)
Chloride: 92 mmol/L — ABNORMAL LOW (ref 98–111)
Creatinine, Ser: 0.94 mg/dL (ref 0.61–1.24)
GFR calc Af Amer: >60 mL/min (ref >60)
GFR calc non Af Amer: >60 mL/min (ref >60)
Glucose, Bld: 127 mg/dL — ABNORMAL HIGH (ref 70–99)
Potassium: 4.6 mmol/L (ref 3.5–5.1)
Sodium: 132 mmol/L — ABNORMAL LOW (ref 135–145)

## 2019-03-25 LAB — GLUCOSE, CAPILLARY
Glucose-Capillary: 116 mg/dL — ABNORMAL HIGH (ref 70–99)
Glucose-Capillary: 124 mg/dL — ABNORMAL HIGH (ref 70–99)
Glucose-Capillary: 140 mg/dL — ABNORMAL HIGH (ref 70–99)
Glucose-Capillary: 100 mg/dL — ABNORMAL HIGH (ref 70–99)

## 2019-03-25 LAB — MAGNESIUM: Magnesium: 2.2 mg/dL (ref 1.7–2.4)

## 2019-03-25 LAB — DIGOXIN LEVEL: Digoxin Level: 1 ng/mL (ref 0.8–2.0)

## 2019-03-25 LAB — HEMOGLOBIN A1C
Hgb A1c MFr Bld: 5.6 % (ref 4.8–5.6)
Mean Plasma Glucose: 114.02 mg/dL

## 2019-03-25 MED ORDER — FERROUS SULFATE 325 (65 FE) MG PO TABS
325.0000 mg | ORAL_TABLET | Freq: Two times a day (BID) | ORAL | Status: DC
  Administered 2019-03-25 – 2019-03-26 (×2): 325 mg via ORAL
  Filled 2019-03-25 (×2): qty 1

## 2019-03-25 MED ORDER — TORSEMIDE 20 MG PO TABS: 40.0000 mg | ORAL_TABLET | Freq: Two times a day (BID) | ORAL | Status: DC

## 2019-03-25 MED ORDER — TORSEMIDE 20 MG PO TABS
40.0000 mg | ORAL_TABLET | Freq: Two times a day (BID) | ORAL | Status: DC
  Administered 2019-03-25 – 2019-03-26 (×2): 40 mg via ORAL
  Filled 2019-03-25 (×2): qty 2

## 2019-03-25 MED ORDER — DIGOXIN 125 MCG PO TABS: 0.0625 mg | ORAL_TABLET | Freq: Every day | ORAL | Status: DC

## 2019-03-25 MED ORDER — LEVALBUTEROL HCL 0.63 MG/3ML IN NEBU
0.6300 mg | INHALATION_SOLUTION | Freq: Three times a day (TID) | RESPIRATORY_TRACT | Status: DC
  Administered 2019-03-25 – 2019-03-26 (×3): 0.63 mg via RESPIRATORY_TRACT
  Filled 2019-03-25 (×3): qty 3

## 2019-03-25 MED ORDER — POLYETHYLENE GLYCOL 3350 17 G PO PACK: 17.0000 g | PACK | Freq: Every day | ORAL | Status: DC | PRN

## 2019-03-25 MED ORDER — SENNOSIDES-DOCUSATE SODIUM 8.6-50 MG PO TABS
1.0000 | ORAL_TABLET | Freq: Every day | ORAL | Status: DC | PRN
  Administered 2019-03-25 – 2019-03-26 (×2): 1 via ORAL
  Filled 2019-03-25 (×2): qty 1

## 2019-03-25 NOTE — Progress Notes (Signed)
PROGRESS NOTE  [name] LDJ:[id number] DOB: 1932/04/10   PCP: Nicoletta Dress, MD  Patient is from: Home  DOA: 03/23/2019 LOS: 2  Brief Narrative / Interim history: 85 year old male with history of CAD/CABG, chronic diastolic CHF, permanent A. fib on Eliquis, AS/TAVR, pulmonary hypertension, COPD on 3 L, DM-2, HTN, HLD, anemia of chronic disease, gout, chronic retention with chronic indwelling Foley catheter and OSA presenting with dyspnea, orthopnea and PND and admitted for acute on chronic CHF.  Unclear cause of his exacerbation.  He says his wife is a retired Marine scientist and watchees his salt and fluid intake.  He denies NSAID use. He reports good compliance with medications including his diuretics.  In ED, slightly hypertensive.  Hgb 8.8.  Sodium 129.  BNP 275.  High-sensitivity troponin 28.  CXR increased moderate left pleural effusion, small right pleural effusion and mild interstitial edema.  Received IV Lasix, IV Solu-Medrol and breathing treatments and admitted for CHF exacerbation.  COVID-19 negative.  Subjective: No major events overnight of this morning.  Reports improvement in his breathing.  Denies chest pain, dizziness or palpitation.  Excellent urine output.  Renal function and electrolytes stable.  Objective: Vitals:   03/25/19 0446 03/25/19 0743 03/25/19 0855 03/25/19 0942  BP:  (!) 111/93    Pulse: 63 76  66  Resp: 15 (!) 23    Temp:  97.6 F (36.4 C)    TempSrc:  Oral    SpO2: 96% 97% 100%   Weight:      Height:        Intake/Output Summary (Last 24 hours) at 03/25/2019 1120 Last data filed at 03/25/2019 1120 Gross per 24 hour  Intake 130.69 ml  Output 3650 ml  Net -3519.31 ml   Filed Weights   03/23/19 1805 03/24/19 0401 03/25/19 0422  Weight: 78.1 kg 83.5 kg 77.7 kg    Examination:  GENERAL: No acute distress.  Appears well.  HEENT: MMM.  Vision and hearing grossly intact.  NECK: Supple.  No apparent JVD.  RESP:  No IWOB.  Fair aeration  bilaterally.  Mild bibasilar crackles. CVS:  RRR. Heart sounds normal.  ABD/GI/GU: Bowel sounds present. Soft. Non tender.  MSK/EXT:  Moves extremities. No apparent deformity or edema.  SKIN: no apparent skin lesion or wound.  Dressing over left and right first toes. NEURO: Awake, alert and oriented appropriately.  No gross deficit.  PSYCH: Calm. Normal affect.   Assessment & Plan: Acute on chronic diastolic CHF with bilateral pleural effusion, left greater than right: Has had dyspnea, orthopnea and PND on admission.  No significant edema.  BNP elevated.  CXR concerning for CHF. Unclear cause of his exacerbation.  Reportedly good compliance.  Does not take NSAIDs.  Responded well to IV Lasix.  Breathing and pleural effusion improved.  Still with bibasilar crackles on exam. -Transition to p.o. torsemide 40 mg twice daily -Daily weight, intake output and renal function. -Sodium and fluid restriction -Continue digoxin and spironolactone. -PT/OT eval   Bilateral pleural effusion/atelectasis: Likely due to CHF.  Improved with diuretics. -Diuretics as above -Incentive spirometry.  Chronic COPD/chronic respiratory failure: On 3 L at baseline. -Continue Dulera, Incruse Ellipta and as needed albuterol's.  Permanent atrial fibrillation: Rate controlled. -Continue Lopressor -Resume Eliquis.  Hyponatremia: Likely dilutional from CHF.  Improving. -Diuretics as above.  History of CAD/CABG: Stable.  No anginal symptoms. -Continue BB, statin and Eliquis.  DM-2 with mild hyperglycemia: CBG was in fair range. -Continue current regimen -Check A1c  Anemia of chronic disease: Hgb 8.7 (baseline).  Anemia panel consistent with iron deficiency -Received Feraheme 10/20 -P.o. iron sulfate.  Chronic urinary retention with chronic indwelling Foley catheter: Exchanged 03/22/2019 per patient -Continue.  Bilateral first toe wounds: Stable.  Follows wound care outpatient.  No signs of infection.  -Appreciate wound care input.  Pressure Injury 02/17/19 Toe (Comment  which one) Anterior;Left;Right deep purple (Active)  02/17/19 2000  Location: Toe (Comment  which one) (great toe)  Location Orientation: Anterior;Left;Right  Staging:   Wound Description (Comments): deep purple  Present on Admission: Yes     DVT prophylaxis: On Eliquis Code Status: DNR/DNI Family Communication: Patient and/or RN. Available if any question. Disposition Plan: Remains inpatient for adequate diuresis.  Anticipate discharge 10/22 if remains stable pending PT/OT eval. Consultants: None  Procedures:  None  Microbiology summarized: COVID-19 negative. MRSA PCR negative.  Sch Meds:  Scheduled Meds: . allopurinol  300 mg Oral Daily  . apixaban  5 mg Oral BID  . atorvastatin  80 mg Oral QHS  . brimonidine  1 drop Both Eyes BID  . Chlorhexidine Gluconate Cloth  6 each Topical Daily  . digoxin  0.125 mg Oral Daily  . insulin aspart  0-9 Units Subcutaneous TID WC  . levalbuterol  0.63 mg Nebulization TID  . Melatonin  6 mg Oral QHS  . metoprolol tartrate  25 mg Oral BID  . mometasone-formoterol  2 puff Inhalation BID  . pantoprazole  20 mg Oral Daily  . potassium chloride  20 mEq Oral BID  . sodium chloride flush  3 mL Intravenous Q12H  . spironolactone  25 mg Oral Daily  . tamsulosin  0.4 mg Oral QPM  . torsemide  40 mg Oral BID  . umeclidinium bromide  1 puff Inhalation Daily   Continuous Infusions: . sodium chloride     PRN Meds:.sodium chloride, acetaminophen, albuterol, ondansetron (ZOFRAN) IV, sodium chloride flush  Antimicrobials: Anti-infectives (From admission, onward)   None       I have personally reviewed the following labs and images: CBC: Recent Labs  Lab 03/23/19 1815 03/24/19 0211  WBC 7.7 7.0  NEUTROABS 5.5  --   HGB 8.8* 8.7*  HCT 28.7* 27.6*  MCV 87.2 84.9  PLT 318 297   BMP &GFR Recent Labs  Lab 03/23/19 1815 03/24/19 0211 03/25/19 0157  NA 129*  131* 132*  K 4.2 4.7 4.6  CL 88* 90* 92*  CO2 29 31 30   GLUCOSE 133* 158* 127*  BUN 10 10 17   CREATININE 1.02 0.93 0.94  CALCIUM 8.9 9.0 9.0  MG  --  2.1 2.2   Estimated Creatinine Clearance: 61.2 mL/min (by C-G formula based on SCr of 0.94 mg/dL). Liver & Pancreas: No results for input(s): AST, ALT, ALKPHOS, BILITOT, PROT, ALBUMIN in the last 168 hours. No results for input(s): LIPASE, AMYLASE in the last 168 hours. No results for input(s): AMMONIA in the last 168 hours. Diabetic: Recent Labs    03/25/19 0157  HGBA1C 5.6   Recent Labs  Lab 03/24/19 0737 03/24/19 1029 03/24/19 1639 03/24/19 2115 03/25/19 0616  GLUCAP 159* 191* 148* 153* 116*   Cardiac Enzymes: No results for input(s): CKTOTAL, CKMB, CKMBINDEX, TROPONINI in the last 168 hours. No results for input(s): PROBNP in the last 8760 hours. Coagulation Profile: No results for input(s): INR, PROTIME in the last 168 hours. Thyroid Function Tests: No results for input(s): TSH, T4TOTAL, FREET4, T3FREE, THYROIDAB in the last 72 hours. Lipid Profile: No  results for input(s): CHOL, HDL, LDLCALC, TRIG, CHOLHDL, LDLDIRECT in the last 72 hours. Anemia Panel: Recent Labs    03/24/19 0719  VITAMINB12 830  FOLATE 21.7  FERRITIN 49  TIBC 374  IRON 19*  RETICCTPCT 3.3*   Urine analysis:    Component Value Date/Time   COLORURINE AMBER (A) 01/19/2019 1647   APPEARANCEUR CLOUDY (A) 01/19/2019 1647   LABSPEC 1.012 01/19/2019 1647   PHURINE 9.0 (H) 01/19/2019 1647   GLUCOSEU NEGATIVE 01/19/2019 1647   HGBUR MODERATE (A) 01/19/2019 1647   BILIRUBINUR NEGATIVE 01/19/2019 1647   KETONESUR NEGATIVE 01/19/2019 1647   PROTEINUR 100 (A) 01/19/2019 1647   UROBILINOGEN 0.2 12/23/2012 1026   NITRITE POSITIVE (A) 01/19/2019 1647   LEUKOCYTESUR MODERATE (A) 01/19/2019 1647   Sepsis Labs: Invalid input(s): PROCALCITONIN, [place]  Microbiology: Recent Results (from the past 240 hour(s))  SARS CORONAVIRUS 2 (TAT 6-24  HRS) Nasopharyngeal Nasopharyngeal Swab     Status: None   Collection Time: 03/23/19  7:13 PM   Specimen: Nasopharyngeal Swab  Result Value Ref Range Status   SARS Coronavirus 2 NEGATIVE NEGATIVE Final    Comment: (NOTE) SARS-CoV-2 target nucleic acids are NOT DETECTED. The SARS-CoV-2 RNA is generally detectable in upper and lower respiratory specimens during the acute phase of infection. Negative results do not preclude SARS-CoV-2 infection, do not rule out co-infections with other pathogens, and should not be used as the sole basis for treatment or other patient management decisions. Negative results must be combined with clinical observations, patient history, and epidemiological information. The expected result is Negative. Fact Sheet for Patients: SugarRoll.be Fact Sheet for Healthcare Providers: https://www.woods-mathews.com/ This test is not yet approved or cleared by the [place] FDA and  has been authorized for detection and/or diagnosis of SARS-CoV-2 by FDA under an Emergency Use Authorization (EUA). This EUA will remain  in effect (meaning this test can be used) for the duration of the COVID-19 declaration under Section 56 4(b)(1) of the Act, 21 U.S.C. section 360bbb-3(b)(1), unless the authorization is terminated or revoked sooner. Performed at [facility], [place] [address], [place], [place] [address]   MRSA PCR Screening     Status: None   Collection Time: 03/23/19 10:25 PM   Specimen: Nasopharyngeal  Result Value Ref Range Status   MRSA by PCR NEGATIVE NEGATIVE Final    Comment:        The GeneXpert MRSA Assay (FDA approved for NASAL specimens only), is one component of a comprehensive MRSA colonization surveillance program. It is not intended to diagnose MRSA infection nor to guide or monitor treatment for MRSA infections. Performed at [facility], [place] [address] [place], [place] [address]      Radiology Studies: Dg Chest 2 View  Result Date: 03/25/2019 CLINICAL DATA:  85 year old male with history of pleural effusion. EXAM: CHEST - 2 VIEW COMPARISON:  Chest x-ray 03/23/2019. FINDINGS: Lung volumes are normal. Small to moderate left and small right pleural effusions with atelectasis and/or consolidation in the left lung base. There is cephalization of the pulmonary vasculature and slight indistinctness of the interstitial markings suggestive of mild pulmonary edema. Mild cardiomegaly. Upper mediastinal contours are within normal limits. Aortic atherosclerosis. Status post TAVR. Status post median sternotomy for CABG including LIMA. IMPRESSION: 1. The appearance of the chest suggests congestive heart failure, as above. 2. In addition, there is extensive atelectasis and/or consolidation in the left lower lobe. Clinical correlation for signs and symptoms of pneumonia is recommended. 3. Aortic  atherosclerosis. Electronically Signed   By: Vinnie Langton M.D.   On: 03/25/2019 09:22   Taye T. Chrisman  If 7PM-7AM, please contact night-coverage [email or web address] Password [email or web address] 03/25/2019, 11:20 AM

## 2019-03-25 NOTE — Evaluation (Signed)
Physical Therapy Evaluation Patient Details Name: [name] MRN: [id number] DOB: 06-21-1931 Today's Date: 03/25/2019   History of Present Illness  Pt is an 85 y/o male admitted secondary to increased SOB likely due to CHF exacerbation. Pt with bilateral pleural effusions. PMH includes HTN, DM, a fib, COPD on 3L, MI, and CAD s/p CABG.   Clinical Impression  Pt admitted secondary to problem above with deficits below. Pt requiring min guard A for mobility tasks using RW. Gait distance limited secondary to fatigue. [name] oxygen sats decreasing to 88% on 3L during ambulation, however, returned to 97% on 3L with seated rest. Pt reports he plans to return home with HHPT. Reports wife is available to assist as needed. Will continue to follow acutely to maximize functional mobility independence and safety.     Follow Up Recommendations Home health PT;Supervision for mobility/OOB    Equipment Recommendations  None recommended by PT    Recommendations for Other Services       Precautions / Restrictions Precautions Precautions: Fall;Other (comment) Precaution Comments: Watch O2 sats Restrictions Weight Bearing Restrictions: No      Mobility  Bed Mobility Overal bed mobility: Needs Assistance Bed Mobility: Supine to Sit;Sit to Supine     Supine to sit: Min assist Sit to supine: Supervision   General bed mobility comments: Min A for trunk elevation to come to sitting. Increased time at EOB secondary to SOB. Supervision for safety and line management for return to supine.   Transfers Overall transfer level: Needs assistance Equipment used: Rolling walker (2 wheeled) Transfers: Sit to/from Stand Sit to Stand: Min guard         General transfer comment: Min guard for safety. Demonstrated safe hand placement.   Ambulation/Gait Ambulation/Gait assistance: Min guard Gait Distance (Feet): 75 Feet Assistive device: Rolling walker (2 wheeled) Gait Pattern/deviations: Step-through  pattern;Decreased stride length Gait velocity: Decreased   General Gait Details: Slow, guarded gait. Cues for pursed lip breathing during gait to help with SOB. Oxygen sats decreasing to 88% on 3L during ambulation, however, returned to 97% on 3L with seated rest.   Stairs            Wheelchair Mobility    Modified Rankin (Stroke Patients Only)       Balance Overall balance assessment: Needs assistance Sitting-balance support: No upper extremity supported;Feet supported Sitting balance-Leahy Scale: Good     Standing balance support: Bilateral upper extremity supported;During functional activity Standing balance-Leahy Scale: Poor Standing balance comment: Reliant on BUE support                              Pertinent Vitals/Pain Pain Assessment: No/denies pain    Home Living Family/patient expects to be discharged to:: Private residence Living Arrangements: Spouse/significant other Available Help at Discharge: Family;Available 24 hours/day Type of Home: House Home Access: Stairs to enter Entrance Stairs-Rails: Psychiatric nurse of Steps: 4 Home Layout: One level Home Equipment: Cane - single point;Walker - 2 wheels;Shower seat;Bedside commode;Crutches      Prior Function Level of Independence: Needs assistance   Gait / Transfers Assistance Needed: Was using RW for mobility tasks.   ADL's / Homemaking Assistance Needed: Pt reports wife assists him with all ADLs        Hand Dominance   Dominant Hand: Right    Extremity/Trunk Assessment   Upper Extremity Assessment Upper Extremity Assessment: Defer to OT evaluation    Lower Extremity Assessment  Lower Extremity Assessment: Generalized weakness    Cervical / Trunk Assessment Cervical / Trunk Assessment: Kyphotic  Communication   Communication: HOH  Cognition Arousal/Alertness: Awake/alert Behavior During Therapy: WFL for tasks assessed/performed Overall Cognitive Status:  No family/caregiver present to determine baseline cognitive functioning                                        General Comments      Exercises     Assessment/Plan    PT Assessment Patient needs continued PT services  PT Problem List Decreased strength;Decreased balance;Decreased mobility;Decreased activity tolerance;Decreased knowledge of use of DME;Decreased knowledge of precautions;Cardiopulmonary status limiting activity       PT Treatment Interventions DME instruction;Gait training;Stair training;Functional mobility training;Therapeutic activities;Therapeutic exercise;Balance training;Patient/family education    PT Goals (Current goals can be found in the Care Plan section)  Acute Rehab PT Goals Patient Stated Goal: to go home PT Goal Formulation: With patient Time For Goal Achievement: 04/08/19 Potential to Achieve Goals: Good    Frequency Min 3X/week   Barriers to discharge        Co-evaluation               AM-PAC PT "6 Clicks" Mobility  Outcome Measure Help needed turning from your back to your side while in a flat bed without using bedrails?: A Little Help needed moving from lying on your back to sitting on the side of a flat bed without using bedrails?: A Little Help needed moving to and from a bed to a chair (including a wheelchair)?: A Little Help needed standing up from a chair using your arms (e.g., wheelchair or bedside chair)?: A Little Help needed to walk in hospital room?: A Little Help needed climbing 3-5 steps with a railing? : A Little 6 Click Score: 18    End of Session Equipment Utilized During Treatment: Gait belt;Oxygen Activity Tolerance: Patient limited by fatigue Patient left: in bed;with call bell/phone within reach Nurse Communication: Mobility status;Other (comment)(pt wanting foley bag emptied) PT Visit Diagnosis: Muscle weakness (generalized) (M62.81)    Time: 2500-3704 PT Time Calculation (min) (ACUTE ONLY):  24 min   Charges:   PT Evaluation $PT Eval Moderate Complexity: 1 Mod PT Treatments $Gait Training: 8-22 mins        Leighton Ruff, PT, DPT  Acute Rehabilitation Services  Pager: [phone] Office: [phone]   Rudean Hitt 03/25/2019, 5:35 PM

## 2019-03-25 NOTE — Plan of Care (Signed)

## 2019-03-25 NOTE — Progress Notes (Signed)
Pt hr sustaining in the 31s and 87s. Pt states that he does not feel any different. MD on call notified. Will continue to monitor.

## 2019-03-25 NOTE — Progress Notes (Signed)
Patient's HR 40's -50's then went up to 60's. Patient's wife said that it has been like that. Continue to monitor [name] HR. HS [employer]

## 2019-03-26 LAB — BASIC METABOLIC PANEL
Anion gap: 9 (ref 5–15)
BUN: 14 mg/dL (ref 8–23)
CO2: 32 mmol/L (ref 22–32)
Calcium: 9 mg/dL (ref 8.9–10.3)
Chloride: 95 mmol/L — ABNORMAL LOW (ref 98–111)
Creatinine, Ser: 0.87 mg/dL (ref 0.61–1.24)
GFR calc Af Amer: >60 mL/min (ref >60)
GFR calc non Af Amer: >60 mL/min (ref >60)
Glucose, Bld: 112 mg/dL — ABNORMAL HIGH (ref 70–99)
Potassium: 4 mmol/L (ref 3.5–5.1)
Sodium: 136 mmol/L (ref 135–145)

## 2019-03-26 LAB — GLUCOSE, CAPILLARY
Glucose-Capillary: 102 mg/dL — ABNORMAL HIGH (ref 70–99)
Glucose-Capillary: 124 mg/dL — ABNORMAL HIGH (ref 70–99)

## 2019-03-26 LAB — HEMOGLOBIN AND HEMATOCRIT, BLOOD
HCT: 27 % — ABNORMAL LOW (ref 39.0–52.0)
Hemoglobin: 8.4 g/dL — ABNORMAL LOW (ref 13.0–17.0)

## 2019-03-26 LAB — MAGNESIUM: Magnesium: 2.1 mg/dL (ref 1.7–2.4)

## 2019-03-26 MED ORDER — TORSEMIDE 20 MG PO TABS: 40.0000 mg | ORAL_TABLET | Freq: Two times a day (BID) | ORAL | 1 refills | Status: AC

## 2019-03-26 MED FILL — TORSEMIDE 20 MG TABLET: 20 | 30 days supply | Qty: 150 | Fill #0

## 2019-03-26 NOTE — TOC Initial Note (Signed)
Transition of Care [facility]) - Initial/Assessment Note    Patient Details  Name: [name] MRN: [id number] Date of Birth: May 26, 1932  [facility] CM/SW Contact:    Zenon Mayo, RN Phone Number: 03/26/2019, 11:36 AM  Clinical Narrative:                 Patient is for dc today, he is active with [name] , wife states she would like to continue with [facility] for [place].  NCM notified Tommi Rumps with [facility] patient. NCM also notified [facility] for palliative services that patient is for dc today.  Wife states she will call the [facility] in [place] to reschedule his apt with them.   Expected Discharge Plan: [place] Barriers to Discharge: No Barriers Identified   Patient Goals and CMS Choice Patient states their goals for this hospitalization and ongoing recovery are:: go home CMS Medicare.gov Compare Post Acute Care list provided to:: Patient Represenative (must comment) Choice offered to / list presented to : Spouse  Expected Discharge Plan and Services Expected Discharge Plan: [place]   Discharge Planning Services: CM Consult Post Acute Care Choice: [place] arrangements for the past 2 months: Single Family Home Expected Discharge Date: 03/26/19               DME Arranged: (NA)         HH Arranged: PT HH Agency: [place] Date [facility] Agency Contacted: 03/26/19 Time HH Agency Contacted: 68 Representative spoke with at [place]: cory  Prior Living Arrangements/Services Living arrangements for the past 2 months: [place] with:: Spouse Patient language and need for interpreter reviewed:: Yes Do you feel safe going back to the place where you live?: Yes      Need for Family Participation in Patient Care: Yes (Comment) Care giver support system in place?: Yes (comment) Current home services: Home RN Criminal Activity/Legal Involvement Pertinent to Current  Situation/Hospitalization: No - Comment as needed  Activities of Daily Living      Permission Sought/Granted                  Emotional Assessment Appearance:: Appears stated age     Orientation: : Oriented to Self, Oriented to Place, Oriented to  Time, Oriented to Situation Alcohol / Substance Use: Not Applicable Psych Involvement: No (comment)  Admission diagnosis:  Acute on chronic diastolic (congestive) heart failure ([place]) [I50.33] Patient Active Problem List   Diagnosis Date Noted  . Acute on chronic diastolic (congestive) heart failure ([place]) 03/23/2019  . Type 2 diabetes mellitus ([place]) 03/23/2019  . Generalized weakness   . Congestive heart failure (CHF) ([place]) 02/17/2019  . Hypoxemia   . Acute pulmonary edema (H[facility])   . Heart failure with preserved ejection fraction ([place]) 01/20/2019  . Acute on chronic diastolic heart failure ([place]) 01/20/2019  . Pyuria 01/20/2019  . Hematuria, microscopic 01/20/2019  . Presence of indwelling Foley catheter 01/20/2019  . Goals of care, counseling/discussion   . Palliative care encounter   . Severe Pulmonary hypertension ([place]) 01/19/2019  . Chronic hypoxemic respiratory failure ([place]) 01/19/2019  . COPD (chronic obstructive pulmonary disease) ([place]) 01/19/2019  . Hypoalbuminemia 01/19/2019  . Bilateral pleural effusion 01/19/2019  . Left atrial severely dilated 01/19/2019  . Dyspnea 12/31/2018  . Pulmonary embolism and infarction ([place])   . Acute on chronic respiratory failure with hypoxia ([place])   . S/P TAVR (transcatheter aortic valve replacement)  09/19/2017  . Mitral stenosis   . Atrial fibrillation ([place])   . Chronic anticoagulation 06/27/2013  . 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 Hypertension associated with diabetes ([place]) 03/10/2009  . Hyperlipidemia associated with type 2 diabetes mellitus ([place]) 09/09/2008  . Anemia of chronic disease 09/09/2008  . RENAL  ARTERY STENOSIS- moderate 09/09/2008   PCP:  Nicoletta Dress, MD Pharmacy:   [facility], [place] [address] Phone: [phone] Fax: [place], [place] - [address] [place] [address] Phone: [phone] Fax: [phone]     Social Determinants of Health (SDOH) Interventions    Readmission Risk Interventions Readmission Risk Prevention Plan 03/26/2019  Transportation Screening Complete  Medication Review ([place]) Complete  HRI or [place] Complete  SW Recovery Care/Counseling Consult Complete  Palliative Care Screening Complete  [place] Not Applicable  Some recent data might be hidden

## 2019-03-26 NOTE — Discharge Instructions (Signed)
Heart Failure, Self Care °Heart failure is a serious condition. This sheet explains things you need to do to take care of yourself at home. To help you stay as healthy as possible, you may be asked to change your diet, take certain medicines, and make other changes in your life. Your doctor may also give you more specific instructions. If you have problems or questions, call your doctor. °What are the risks? °Having heart failure makes it more likely for you to have some problems. These problems can get worse if you do not take good care of yourself. Problems may include: °· Blood clotting problems. This may cause a stroke. °· Damage to the kidneys, liver, or lungs. °· Abnormal heart rhythms. °Supplies needed: °· Scale for weighing yourself. °· Blood pressure monitor. °· Notebook. °· Medicines. °How to care for yourself when you have heart failure °Medicines °Take over-the-counter and prescription medicines only as told by your doctor. Take your medicines every day. °· Do not stop taking your medicine unless your doctor tells you to do so. °· Do not skip any medicines. °· Get your prescriptions refilled before you run out of medicine. This is important. °Eating and drinking ° °· Eat heart-healthy foods. Talk with a diet specialist (dietitian) to create an eating plan. °· Choose foods that: °? Have no trans fat. °? Are low in saturated fat and cholesterol. °· Choose healthy foods, such as: °? Fresh or frozen fruits and vegetables. °? Fish. °? Low-fat (lean) meats. °? Legumes, such as beans, peas, and lentils. °? Fat-free or low-fat dairy products. °? Whole-grain foods. °? High-fiber foods. °· Limit salt (sodium) if told by your doctor. Ask your diet specialist to tell you which seasonings are healthy for your heart. °· Cook in healthy ways instead of frying. Healthy ways of cooking include roasting, grilling, broiling, baking, poaching, steaming, and stir-frying. °· Limit how much fluid you drink, if told by your  doctor. °Alcohol use °· Do not drink alcohol if: °? Your doctor tells you not to drink. °? Your heart was damaged by alcohol, or you have very bad heart failure. °? You are pregnant, may be pregnant, or are planning to become pregnant. °· If you drink alcohol: °? Limit how much you use to: °§ 0-1 drink a day for women. °§ 0-2 drinks a day for men. °? Be aware of how much alcohol is in your drink. In the [place] one drink equals one 12 oz bottle of beer (355 mL), one 5 oz glass of wine (148 mL), or one 1½ oz glass of hard liquor (44 mL). °Lifestyle ° °· Do not use any products that contain nicotine or tobacco, such as cigarettes, e-cigarettes, and chewing tobacco. If you need help quitting, ask your doctor. °? Do not use nicotine gum or patches before talking to your doctor. °· Do not use illegal drugs. °· Lose weight if told by your doctor. °· Do physical activity if told by your doctor. Talk to your doctor before you begin an exercise if: °? You are an older adult. °? You have very bad heart failure. °· Learn to manage stress. If you need help, ask your doctor. °· Get rehab (rehabilitation) to help you stay independent and to help with your quality of life. °· Plan time to rest when you get tired. °Check weight and blood pressure ° °· Weigh yourself every day. This will help you to know if fluid is building up in your body. °? Weigh yourself every morning   after you pee (urinate) and before you eat breakfast. °? Wear the same amount of clothing each time. °? Write down your daily weight. Give your record to your doctor. °· Check and write down your blood pressure as told by your doctor. °· Check your pulse as told by your doctor. °Dealing with very hot and very cold weather °· If it is very hot: °? Avoid activities that take a lot of energy. °? Use air conditioning or fans, or find a cooler place. °? Avoid caffeine and alcohol. °? Wear clothing that is loose-fitting, lightweight, and light-colored. °· If it is very  cold: °? Avoid activities that take a lot of energy. °? Layer your clothes. °? Wear mittens or gloves, a hat, and a scarf when you go outside. °? Avoid alcohol. °Follow these instructions at home: °· Stay up to date with shots (vaccines). Get pneumococcal and flu (influenza) shots. °· Keep all follow-up visits as told by your doctor. This is important. °Contact a doctor if: °· You gain weight quickly. °· You have increasing shortness of breath. °· You cannot do your normal activities. °· You get tired easily. °· You cough a lot. °· You don't feel like eating or feel like you may vomit (nauseous). °· You become puffy (swell) in your hands, feet, ankles, or belly (abdomen). °· You cannot sleep well because it is hard to breathe. °· You feel like your heart is beating fast (palpitations). °· You get dizzy when you stand up. °Get help right away if: °· You have trouble breathing. °· You or someone else notices a change in your behavior, such as having trouble staying awake. °· You have chest pain or discomfort. °· You pass out (faint). °These symptoms may be an emergency. Do not wait to see if the symptoms will go away. Get medical help right away. Call your local emergency services (911 in the [place] Do not drive yourself to the hospital. °Summary °· Heart failure is a serious condition. To care for yourself, you may have to change your diet, take medicines, and make other lifestyle changes. °· Take your medicines every day. Do not stop taking them unless your doctor tells you to do so. °· Eat heart-healthy foods, such as fresh or frozen fruits and vegetables, fish, lean meats, legumes, fat-free or low-fat dairy products, and whole-grain or high-fiber foods. °· Ask your doctor if you can drink alcohol. You may have to stop alcohol use if you have very bad heart failure. °· Contact your doctor if you gain weight quickly or feel that your heart is beating too fast. Get help right away if you pass out, or have chest pain  or trouble breathing. °This information is not intended to replace advice given to you by your health care provider. Make sure you discuss any questions you have with your health care provider. °Document Released: 09/03/2018 Document Revised: 09/02/2018 Document Reviewed: 09/03/2018 °Elsevier Patient Education © 2020 [employer] ° °

## 2019-03-26 NOTE — Plan of Care (Signed)

## 2019-03-26 NOTE — Progress Notes (Signed)
Physical Therapy Treatment Patient Details Name: [name] MRN: [id number] DOB: 1932/05/09 Today's Date: 03/26/2019    History of Present Illness Pt is an 85 y/o male admitted secondary to increased SOB likely due to CHF exacerbation. Pt with bilateral pleural effusions. PMH includes HTN, DM, a fib, COPD on 3L, MI, and CAD s/p CABG.     PT Comments    Patient progressing with mobility and able to demonstrate O2 saturations WNL on 3L O2, but with some dyspnea still needing supplemental O2.  Wife in the room this session and reports previously set up with [name] services.  Continue to recommend follow up HHPT.  Will follow up if not d/c.    Follow Up Recommendations  Home health PT;Supervision for mobility/OOB(resume prior services)     Equipment Recommendations  None recommended by PT    Recommendations for Other Services       Precautions / Restrictions Precautions Precautions: Fall    Mobility  Bed Mobility Overal bed mobility: Needs Assistance       Supine to sit: Supervision;HOB elevated Sit to supine: Supervision;HOB elevated   General bed mobility comments: assist for lines; used elevated HOB as pt reports has adjustable bed at home  Transfers Overall transfer level: Needs assistance Equipment used: Rolling walker (2 wheeled) Transfers: Sit to/from Stand Sit to Stand: Supervision         General transfer comment: assist for safety with lines  Ambulation/Gait Ambulation/Gait assistance: Supervision;Min guard Gait Distance (Feet): 90 Feet Assistive device: Rolling walker (2 wheeled) Gait Pattern/deviations: Step-through pattern;Decreased stride length     General Gait Details: on 3L O2 with ambulation SpO2 94% HR stable; close S to minguard for Barrister's clerk    Modified Rankin (Stroke Patients Only)       Balance Overall balance assessment: Needs assistance Sitting-balance support: No upper extremity  supported Sitting balance-Leahy Scale: Good     Standing balance support: Bilateral upper extremity supported;During functional activity Standing balance-Leahy Scale: Poor Standing balance comment: Reliant on BUE support                             Cognition Arousal/Alertness: Awake/alert Behavior During Therapy: WFL for tasks assessed/performed Overall Cognitive Status: History of cognitive impairments - at baseline                                 General Comments: wife in the room and supportive and compensates for his deficits      Exercises      General Comments General comments (skin integrity, edema, etc.): discussed steps for home entry and wife reports small steps with rail pt and wife without concerns; just eager for d/c home today      Pertinent Vitals/Pain Pain Assessment: No/denies pain    Home Living                      Prior Function            PT Goals (current goals can now be found in the care plan section) Progress towards PT goals: Progressing toward goals    Frequency    Min 3X/week      PT Plan Current plan remains appropriate    Co-evaluation  AM-PAC PT "6 Clicks" Mobility   Outcome Measure  Help needed turning from your back to your side while in a flat bed without using bedrails?: A Little Help needed moving from lying on your back to sitting on the side of a flat bed without using bedrails?: A Little Help needed moving to and from a bed to a chair (including a wheelchair)?: None Help needed standing up from a chair using your arms (e.g., wheelchair or bedside chair)?: None Help needed to walk in hospital room?: A Little Help needed climbing 3-5 steps with a railing? : A Little 6 Click Score: 20    End of Session Equipment Utilized During Treatment: Oxygen Activity Tolerance: Patient tolerated treatment well Patient left: in bed;with call bell/phone within reach;with  family/visitor present Nurse Communication: Other (comment)(awaiting d/c) PT Visit Diagnosis: Muscle weakness (generalized) (M62.81)     Time: 4562-5638 PT Time Calculation (min) (ACUTE ONLY): 16 min  Charges:  $Gait Training: 8-22 mins                     Magda Kiel, [place] [phone] 03/26/2019    Reginia Naas 03/26/2019, 12:20 PM

## 2019-03-26 NOTE — TOC Transition Note (Signed)
Transition of Care [facility]) - CM/SW Discharge Note   Patient Details  Name: [name] MRN: [id number] Date of Birth: 1931/06/18  Transition of Care [facility]) CM/SW Contact:  Zenon Mayo, RN Phone Number: 03/26/2019, 11:31 AM   Clinical Narrative:    Patient is for dc today, he is active with [name] , wife states she would like to continue with [facility] for [place].  NCM notified Tommi Rumps with [facility] patient is for dc today.     Final next level of care: [place] Barriers to Discharge: No Barriers Identified   Patient Goals and CMS Choice Patient states their goals for this hospitalization and ongoing recovery are:: go home CMS Medicare.gov Compare Post Acute Care list provided to:: Patient Represenative (must comment)(wife)    Discharge Placement                       Discharge Plan and Services                DME Arranged: (NA)         HH Arranged: PT HH Agency: [place] Date [facility] Agency Contacted: 03/26/19 Time [place]: [phone] Representative spoke with at [place]: cory  Social Determinants of Health ([place]) Interventions     Readmission Risk Interventions No flowsheet data found.

## 2019-03-26 NOTE — Evaluation (Signed)
Occupational Therapy Evaluation Patient Details Name: [name] MRN: [id number] DOB: [birth year] Today's Date: 03/26/2019    History of Present Illness Pt is an 85 y/o male admitted secondary to increased SOB likely due to CHF exacerbation. Pt with bilateral pleural effusions. PMH includes HTN, DM, a fib, COPD on 3L, MI, and CAD s/p CABG.    Clinical Impression   Pt with recent stay at SNF, at home wife is helping with all ADL at bed level, performing transfers with RW, using 3 in 1 over commode. Today Pt is min guard for bed mobility, standing with RW at EOB, and taking steps up the side. He dropped to 86% on 3L O2 and returned quickly with seated rest and PLB. Pt is at his baseline from coming home from [place] - however he really should have continued OT at the [facility] level to maximize safety and independence in ADL and functional transfer to progress his ability to perform ADL, be able to complete shower transfers, bath and dress himself without assist from his wife (prior to the SNF stay he was more independent). Pt eager to return home, and as he has discharge orders plan on HHOT and no further acute treatment.     Follow Up Recommendations  Home health OT;Supervision/Assistance - 24 hour    Equipment Recommendations  None recommended by OT(Pt has appropriate DME)    Recommendations for Other Services       Precautions / Restrictions Precautions Precautions: Fall;Other (comment) Precaution Comments: Watch O2 sats Restrictions Weight Bearing Restrictions: No      Mobility Bed Mobility Overal bed mobility: Needs Assistance Bed Mobility: Supine to Sit;Sit to Supine     Supine to sit: Min guard Sit to supine: Supervision   General bed mobility comments: min guard, increased time and use of bed rails to elevate trunk and   Transfers Overall transfer level: Needs assistance Equipment used: Rolling walker (2 wheeled) Transfers: Sit to/from Stand Sit to Stand: Min guard         General transfer comment: Min guard for safety. Demonstrated safe hand placement.     Balance Overall balance assessment: Needs assistance Sitting-balance support: No upper extremity supported;Feet supported Sitting balance-Leahy Scale: Good     Standing balance support: Bilateral upper extremity supported;During functional activity Standing balance-Leahy Scale: Poor Standing balance comment: Reliant on BUE support                            ADL either performed or assessed with clinical judgement   ADL Overall ADL's : At baseline                                       General ADL Comments: Pt reports that at baseline he bathes in bed, with wife assist. He dresses with wife assist at bed level and puts on his shirt EOB, He uses that 3 in1 over the toilet for BM and is able to perform peri care. He does his own grooming, and his own feeding. He transfers with supervision.      Vision Patient Visual Report: No change from baseline       Perception     Praxis      Pertinent Vitals/Pain Pain Assessment: No/denies pain     Hand Dominance Right   Extremity/Trunk Assessment Upper Extremity Assessment Upper Extremity Assessment: Generalized weakness  Lower Extremity Assessment Lower Extremity Assessment: Defer to PT evaluation   Cervical / Trunk Assessment Cervical / Trunk Assessment: Kyphotic   Communication Communication Communication: HOH   Cognition Arousal/Alertness: Awake/alert Behavior During Therapy: WFL for tasks assessed/performed Overall Cognitive Status: No family/caregiver present to determine baseline cognitive functioning                                     General Comments       Exercises     Shoulder Instructions      Home Living Family/patient expects to be discharged to:: Private residence Living Arrangements: Spouse/significant other Available Help at Discharge: Family;Available 24  hours/day Type of Home: House Home Access: Stairs to enter [employer] of Steps: 4 Entrance Stairs-Rails: Right;Left Home Layout: One level     Bathroom Shower/Tub: Occupational psychologist: Standard     Home Equipment: Cane - single point;Walker - 2 wheels;Shower seat;Bedside commode;Crutches   Additional Comments: recently at [facility] 01/2019      Prior Functioning/Environment Level of Independence: Needs assistance  Gait / Transfers Assistance Needed: Was using RW for mobility tasks.  ADL's / Homemaking Assistance Needed: Pt reports wife assists him with all ADLs   Comments: wife former Therapist, sports, in good health        OT Problem List: Decreased activity tolerance;Impaired balance (sitting and/or standing)      OT Treatment/Interventions:      OT Goals(Current goals can be found in the care plan section) Acute Rehab OT Goals Patient Stated Goal: to go home with wife OT Goal Formulation: With patient Time For Goal Achievement: 04/09/19 Potential to Achieve Goals: Good  OT Frequency:     Barriers to D/C:            Co-evaluation              AM-PAC OT "6 Clicks" Daily Activity     Outcome Measure Help from another person eating meals?: A Little Help from another person taking care of personal grooming?: A Little Help from another person toileting, which includes using toliet, bedpan, or urinal?: A Little Help from another person bathing (including washing, rinsing, drying)?: A Lot Help from another person to put on and taking off regular upper body clothing?: A Little Help from another person to put on and taking off regular lower body clothing?: Total 6 Click Score: 15   End of Session Equipment Utilized During Treatment: Gait belt;Rolling walker;Oxygen(3L) Nurse Communication: Mobility status  Activity Tolerance: Patient tolerated treatment well Patient left: in bed;with call bell/phone within reach;with bed alarm set;Other (comment)(all 4  rails up by request)  OT Visit Diagnosis: Unsteadiness on feet (R26.81);Other abnormalities of gait and mobility (R26.89);Muscle weakness (generalized) (M62.81)                Time: 4854-6270 OT Time Calculation (min): 31 min Charges:  OT General Charges $OT Visit: 1 Visit OT Evaluation $OT Eval Moderate Complexity: 1 Mod OT Treatments $Self Care/Home Management : 8-22 mins  Hulda Humphrey OTR/L Acute Rehabilitation Services Pager: [phone] Office: [phone]  Merri Ray Orianna Biskup 03/26/2019, 10:29 AM

## 2019-03-26 NOTE — Discharge Summary (Signed)
Physician Discharge Summary  [name] NLG:[id number] DOB: 1931/12/07 DOA: 03/23/2019  PCP: Nicoletta Dress, MD  Admit date: 03/23/2019 Discharge date: 03/26/2019  Admitted From: Home Disposition: Home  Recommendations for Outpatient Follow-up:  1. Follow up with PCP in 1-2 weeks 2. Please obtain CBC/BMP/Mag at follow up 3. Please follow up on the following pending results: None  Home Health: PT/OT Equipment/Devices: None  Discharge Condition: Stable CODE STATUS: Full code  Hospital Course: 85 year old male with history of CAD/CABG, chronic diastolic CHF, permanent A. fib on Eliquis, AS/TAVR, pulmonary hypertension, COPD on 3 L, DM-2, HTN, HLD, anemia of chronic disease, gout, chronic retention with chronic indwelling Foley catheter and OSA presenting with dyspnea, orthopnea and PND and admitted for acute on chronic CHF.  Unclear cause of his exacerbation.  He says his wife is a retired Marine scientist and watchees his salt and fluid intake.  He denies NSAID use. He reports good compliance with medications including his diuretics.  In ED, slightly hypertensive.  Hgb 8.8.  Sodium 129.  BNP 275.  High-sensitivity troponin 28.  CXR increased moderate left pleural effusion, small right pleural effusion and mild interstitial edema.  Received IV Lasix, IV Solu-Medrol and breathing treatments and admitted for CHF exacerbation.  COVID-19 negative.  Patient was treated with IV Lasix with excellent response.  Transition to home torsemide and continues to have good urine output.  Diuresed a total of -6 L.  Respiratory symptoms and pleural effusion improved.  Evaluated by PT/OT who recommended home health PT/OT.  Ambulated on home oxygen and maintained good saturation.   Discharged on home torsemide 40 mg twice daily with an option to take additional 20 mg as needed for dyspnea, edema or 2 pound weight gain.  Counseled on salt and fluid restriction and daily weight.  Encouraged to follow-up with  PCP and cardiology in 1 to 2 weeks.  See individual problem list below for more hospital course. Discharge Diagnoses:  Acute on chronic diastolic CHF with bilateral pleural effusion, left greater than right: Echo on 01/01/2019 with EF of 50 to 55%, and RVSP to 55 mmHg.  Has had dyspnea, orthopnea and PND on admission.  No significant edema.  BNP elevated.  CXR concerning for CHF. Unclear cause of his exacerbation.  Reportedly good compliance.  Does not take NSAIDs.  Continue to diurese well on torsemide.  UOP 3 L in the last 24 hours and 6 L since admission.  Renal function improved. -Discharged on torsemide 40 mg twice daily with an option to take additional 20 mg as needed -Counseled on sodium and fluid restriction.  Encouraged on daily weight. -Continue digoxin and spironolactone. -Outpatient follow-up with cardiology in 1 to 2 weeks -Recheck BMP and magnesium at follow-up. -HH PT/OT  Bilateral pleural effusion/atelectasis: Likely due to CHF.  Improved with diuretics. -Diuretics as above -Incentive spirometry.  Chronic COPD/chronic respiratory failure: On 3 L at baseline. -Discharged on home medications on home oxygen.  Permanent atrial fibrillation: Rate controlled. -Discharged on Lopressor, digoxin and home Eliquis  Hyponatremia: Likely dilutional from CHF.  Resolved.  History of CAD/CABG: Stable.  No anginal symptoms. -Continue BB, statin and Eliquis.  DM-2 with mild hyperglycemia: CBG was in fair range. -Discharged on home medications.  Anemia of chronic disease: Hgb 8.7 (baseline).  Anemia panel consistent with iron deficiency -Received Feraheme 10/20 -Discharged on home iron.  Chronic urinary retention with chronic indwelling Foley catheter: Exchanged 03/22/2019 per patient -Continue chronic Foley -Patient has outpatient follow-up with urology.  Bilateral first toe wounds: Stable.  Follows wound care outpatient.  No signs of infection. -Outpatient follow-up  with wound care.  Pressure Injury 02/17/19 Toe (Comment  which one) Anterior;Left;Right deep purple (Active)  02/17/19 2000  Location: Toe (Comment  which one) (great toe)  Location Orientation: Anterior;Left;Right  Staging:   Wound Description (Comments): deep purple  Present on Admission: Yes      Discharge Instructions  Discharge Instructions    (HEART FAILURE PATIENTS) Call MD:  Anytime you have any of the following symptoms: 1) 3 pound weight gain in 24 hours or 5 pounds in 1 week 2) shortness of breath, with or without a dry hacking cough 3) swelling in the hands, feet or stomach 4) if you have to sleep on extra pillows at night in order to breathe.   Complete by: As directed    Call MD for:  difficulty breathing, headache or visual disturbances   Complete by: As directed    Call MD for:  extreme fatigue   Complete by: As directed    Call MD for:  persistant dizziness or light-headedness   Complete by: As directed    Diet - low sodium heart healthy   Complete by: As directed    Diet Carb Modified   Complete by: As directed    Discharge instructions   Complete by: As directed    It has been a pleasure taking care of you! You were hospitalized with shortness of breath due to CHF exacerbation and pleural effusion (fluid around the lung).  Your symptoms improved after treatment with fluid medication.  Continue torsemide twice a day.  You may take additional 20 mg (1 tablet) if you have shortness of breath, edema or weight gain greater than 2 pounds.  We recommend fluid and sodium restriction to 1500 cc (6 cups) and 2000 mg (2 g) a day respectively.  Place weight yourself every day.  Please review your new medication list and the directions before you take your medications. Please call your primary care office and cardiologist as soon as possible to schedule hospital follow-up visit in 1 to 2 weeks.  Take care,   Increase activity slowly   Complete by: As directed       Allergies as of 03/26/2019      Reactions   Bactrim [sulfamethoxazole-trimethoprim] Other (See Comments)   "Allergic," per [facility]   Levaquin [levofloxacin In D5w] Diarrhea, Other (See Comments)   "Allergic," per [facility]      Medication List    TAKE these medications   acyclovir 800 MG tablet Commonly known as: ZOVIRAX Take 400 mg by mouth 2 (two) times daily.   allopurinol 300 MG tablet Commonly known as: ZYLOPRIM Take 300 mg by mouth daily.   apixaban 5 MG Tabs tablet Commonly known as: ELIQUIS Take 1 tablet (5 mg total) by mouth 2 (two) times daily.   atorvastatin 80 MG tablet Commonly known as: LIPITOR Take 1 tablet (80 mg total) by mouth at bedtime.   brimonidine 0.2 % ophthalmic solution Commonly known as: ALPHAGAN Place 1 drop into both eyes 2 (two) times daily.   digoxin 0.125 MG tablet Commonly known as: LANOXIN Take 1 tablet (0.125 mg total) by mouth daily.   ferrous sulfate 325 (65 FE) MG tablet Take 1 tablet (325 mg total) by mouth daily with breakfast.   lansoprazole 30 MG capsule Commonly known as: PREVACID Take 30 mg by mouth daily at 12 noon.   levalbuterol 0.63 MG/3ML nebulizer solution Commonly  known as: XOPENEX Take 3 mLs (0.63 mg total) by nebulization every 6 (six) hours as needed for wheezing or shortness of breath. What changed: when to take this   Melatonin 5 MG Tabs Take 5 mg by mouth at bedtime.   metFORMIN 500 MG 24 hr tablet Commonly known as: GLUCOPHAGE-XR Take 500 mg by mouth at bedtime.   metoprolol tartrate 25 MG tablet Commonly known as: LOPRESSOR Take 1 tablet (25 mg total) by mouth 2 (two) times daily.   multivitamin with minerals Tabs tablet Take 1 tablet by mouth daily.   NON FORMULARY Take 120 mLs by mouth See admin instructions. MedPass: Drink 120 ml's by mouth two times a day   NovoLOG FlexPen 100 UNIT/ML FlexPen Generic drug: insulin aspart Inject 2 Units into the skin See admin instructions. Inject 2 units into the  skin at 9 AM in the morning as needed for a BGL >200   OXYGEN Inhale 3 L/min into the lungs continuous. TO MAINTAIN SATS <90%   polyethylene glycol 17 g packet Commonly known as: MIRALAX / GLYCOLAX Take 17 g by mouth 2 (two) times daily.   potassium chloride SA 15 MEQ tablet Commonly known as: KLOR-CON M15 Take 2 tablets (30 mEq total) by mouth 2 (two) times daily.   spironolactone 25 MG tablet Commonly known as: ALDACTONE Take 25 mg by mouth daily.   Symbicort 80-4.5 MCG/ACT inhaler Generic drug: budesonide-formoterol Inhale 1 puff into the lungs daily as needed ("for shortness of breath or wheezing- rinse mouth afterwards").   tamsulosin 0.4 MG Caps capsule Commonly known as: FLOMAX Take 1 capsule (0.4 mg total) by mouth daily after supper. What changed: when to take this   torsemide 20 MG tablet Commonly known as: DEMADEX Take 2 tablets (40 mg total) by mouth 2 (two) times daily. Take additional 20 mg if S OB, edema or > 2 lbs weight gain What changed: additional instructions   trolamine salicylate 10 % cream Commonly known as: ASPERCREME Apply 1 application topically every 6 (six) hours as needed for muscle pain.   umeclidinium bromide 62.5 MCG/INH Aepb Commonly known as: INCRUSE ELLIPTA Inhale 1 puff into the lungs daily.   Vitamin D 50 MCG (2000 UT) tablet Take 1 tablet by mouth daily.      Follow-up Information    Nicoletta Dress, MD. Schedule an appointment as soon as possible for a visit in 1 week(s).   Specialty: Internal Medicine Contact information: [place] [address] [phone]        Lelon Perla, MD .   Specialty: Cardiology Contact information: [address] [place] [address] [phone]           Consultations:  None  Procedures/Studies:  2D Echo: None this hospitalization  Dg Chest 2 View  Result Date: 03/25/2019 CLINICAL DATA:  85 year old male with history of  pleural effusion. EXAM: CHEST - 2 VIEW COMPARISON:  Chest x-ray 03/23/2019. FINDINGS: Lung volumes are normal. Small to moderate left and small right pleural effusions with atelectasis and/or consolidation in the left lung base. There is cephalization of the pulmonary vasculature and slight indistinctness of the interstitial markings suggestive of mild pulmonary edema. Mild cardiomegaly. Upper mediastinal contours are within normal limits. Aortic atherosclerosis. Status post TAVR. Status post median sternotomy for CABG including LIMA. IMPRESSION: 1. The appearance of the chest suggests congestive heart failure, as above. 2. In addition, there is extensive atelectasis and/or consolidation in the left lower  lobe. Clinical correlation for signs and symptoms of pneumonia is recommended. 3. Aortic atherosclerosis. Electronically Signed   By: Vinnie Langton M.D.   On: 03/25/2019 09:22   Dg Chest Portable 1 View  Result Date: 03/23/2019 CLINICAL DATA:  Shortness of breath EXAM: PORTABLE CHEST 1 VIEW COMPARISON:  02/17/2019 FINDINGS: Post sternotomy changes. Moderate left pleural effusion, increased compared to prior. Small right pleural effusion also increased. Mild cardiomegaly with vascular congestion and mild interstitial edema. Dense airspace disease at the lingula and left base. Valve prosthesis. IMPRESSION: 1. Bilateral pleural effusions, small on the right and moderate on the left. 2. Cardiomegaly with vascular congestion and mild interstitial edema 3. Dense atelectasis or pneumonia at the lingula and left base. Electronically Signed   By: Donavan Foil M.D.   On: 03/23/2019 18:32      Subjective: No major events overnight of this morning.  No complaint this morning.  Feels well and ready to go home.  He denies chest pain, dyspnea, palpitation or dizziness.    Discharge Exam: Vitals:   03/25/19 2347 03/26/19 0339  BP:  (!) 114/53  Pulse: 79 66  Resp: 16 20  Temp: 97.9 F (36.6 C) 98.4 F (36.9  C)  SpO2: 97% 100%    GENERAL: No acute distress.  Appears well.  HEENT: MMM.  Vision and hearing grossly intact.  NECK: Supple.  No apparent JVD LUNGS:  No IWOB.  Fair aeration bilaterally. HEART:  RRR. Heart sounds normal.  ABD: Bowel sounds present. Soft. Non tender.  MSK/EXT:  Moves all extremities. No apparent deformity. No edema bilaterally. SKIN: no apparent skin lesion or wound.  Bilateral toe wound appears clean and dry. NEURO: Awake, alert and oriented appropriately.  No gross deficit.  PSYCH: Calm. Normal affect.     The results of significant diagnostics from this hospitalization (including imaging, microbiology, ancillary and laboratory) are listed below for reference.     Microbiology: Recent Results (from the past 240 hour(s))  SARS CORONAVIRUS 2 (TAT 6-24 HRS) Nasopharyngeal Nasopharyngeal Swab     Status: None   Collection Time: 03/23/19  7:13 PM   Specimen: Nasopharyngeal Swab  Result Value Ref Range Status   SARS Coronavirus 2 NEGATIVE NEGATIVE Final    Comment: (NOTE) SARS-CoV-2 target nucleic acids are NOT DETECTED. The SARS-CoV-2 RNA is generally detectable in upper and lower respiratory specimens during the acute phase of infection. Negative results do not preclude SARS-CoV-2 infection, do not rule out co-infections with other pathogens, and should not be used as the sole basis for treatment or other patient management decisions. Negative results must be combined with clinical observations, patient history, and epidemiological information. The expected result is Negative. Fact Sheet for Patients: SugarRoll.be Fact Sheet for Healthcare Providers: https://www.woods-mathews.com/ This test is not yet approved or cleared by the [place] FDA and  has been authorized for detection and/or diagnosis of SARS-CoV-2 by FDA under an Emergency Use Authorization (EUA). This EUA will remain  in effect (meaning this test  can be used) for the duration of the COVID-19 declaration under Section 56 4(b)(1) of the Act, 21 U.S.C. section 360bbb-3(b)(1), unless the authorization is terminated or revoked sooner. Performed at [facility], [place] [address], [place], [place] [address]   MRSA PCR Screening     Status: None   Collection Time: 03/23/19 10:25 PM   Specimen: Nasopharyngeal  Result Value Ref Range Status   MRSA by PCR NEGATIVE NEGATIVE Final    Comment:  The GeneXpert MRSA Assay (FDA approved for NASAL specimens only), is one component of a comprehensive MRSA colonization surveillance program. It is not intended to diagnose MRSA infection nor to guide or monitor treatment for MRSA infections. Performed at [facility], [place] [address], [place] [address]      Labs: BNP (last 3 results) Recent Labs    01/19/19 1408 02/17/19 1333 03/23/19 1815  BNP 222.4* 299.4* 710.6*   Basic Metabolic Panel: Recent Labs  Lab 03/23/19 1815 03/24/19 0211 03/25/19 0157 03/26/19 0321  NA 129* 131* 132* 136  K 4.2 4.7 4.6 4.0  CL 88* 90* 92* 95*  CO2 29 31 30  32  GLUCOSE 133* 158* 127* 112*  BUN 10 10 17 14   CREATININE 1.02 0.93 0.94 0.87  CALCIUM 8.9 9.0 9.0 9.0  MG  --  2.1 2.2 2.1   Liver Function Tests: No results for input(s): AST, ALT, ALKPHOS, BILITOT, PROT, ALBUMIN in the last 168 hours. No results for input(s): LIPASE, AMYLASE in the last 168 hours. No results for input(s): AMMONIA in the last 168 hours. CBC: Recent Labs  Lab 03/23/19 1815 03/24/19 0211 03/26/19 0321  WBC 7.7 7.0  --   NEUTROABS 5.5  --   --   HGB 8.8* 8.7* 8.4*  HCT 28.7* 27.6* 27.0*  MCV 87.2 84.9  --   PLT 318 297  --    Cardiac Enzymes: No results for input(s): CKTOTAL, CKMB, CKMBINDEX, TROPONINI in the last 168 hours. BNP: Invalid input(s): POCBNP CBG: Recent Labs  Lab 03/25/19 0616 03/25/19 1124 03/25/19 1606 03/25/19 2206 03/26/19 0634  GLUCAP 116* 124* 140* 100* 102*    D-Dimer No results for input(s): DDIMER in the last 72 hours. Hgb A1c Recent Labs    03/25/19 0157  HGBA1C 5.6   Lipid Profile No results for input(s): CHOL, HDL, LDLCALC, TRIG, CHOLHDL, LDLDIRECT in the last 72 hours. Thyroid function studies No results for input(s): TSH, T4TOTAL, T3FREE, THYROIDAB in the last 72 hours.  Invalid input(s): FREET3 Anemia work up Recent Labs    03/24/19 0719  VITAMINB12 830  FOLATE 21.7  FERRITIN 49  TIBC 374  IRON 19*  RETICCTPCT 3.3*   Urinalysis    Component Value Date/Time   COLORURINE AMBER (A) 01/19/2019 1647   APPEARANCEUR CLOUDY (A) 01/19/2019 1647   LABSPEC 1.012 01/19/2019 1647   PHURINE 9.0 (H) 01/19/2019 1647   GLUCOSEU NEGATIVE 01/19/2019 1647   HGBUR MODERATE (A) 01/19/2019 1647   BILIRUBINUR NEGATIVE 01/19/2019 1647   KETONESUR NEGATIVE 01/19/2019 1647   PROTEINUR 100 (A) 01/19/2019 1647   UROBILINOGEN 0.2 12/23/2012 1026   NITRITE POSITIVE (A) 01/19/2019 1647   LEUKOCYTESUR MODERATE (A) 01/19/2019 1647   Sepsis Labs Invalid input(s): PROCALCITONIN,  WBC,  LACTICIDVEN   Time coordinating discharge: 35 minutes  SIGNED:  Mercy Riding, MD  [facility] 03/26/2019, 7:35 AM  If 7PM-7AM, please contact night-coverage [email or web address] Password [email or web address]

## 2019-03-26 NOTE — Consult Note (Signed)
   [facility] CM Inpatient Consult   03/26/2019  [name] 1932-01-05 [id number]  Patient screened for extreme high risk score for unplanned readmission score  And less than 30 day readmission in the Medicare NGACO.  Review of patient's medical record reveals patient was admitted with chronic diastolic heart failure per MD notes.  Patient was previously active with [facility] [place].  Spoke with Jenny Reichmann at [facility] to confirm patient's active status with [facility] program. Patient will have [facility].   Plan:  Sign off as patient's care management needs will be met in [facility] program.   For questions contact:   Natividad Brood, RN BSN [facility] Liaison  [phone] business mobile phone Toll free office [phone]  Fax number: [phone] [email or web address]

## 2019-03-27 DIAGNOSIS — E1151 Type 2 diabetes mellitus with diabetic peripheral angiopathy without gangrene: Secondary | ICD-10-CM | POA: Diagnosis not present

## 2019-03-27 DIAGNOSIS — I110 Hypertensive heart disease with heart failure: Secondary | ICD-10-CM | POA: Diagnosis not present

## 2019-03-27 DIAGNOSIS — I25110 Atherosclerotic heart disease of native coronary artery with unstable angina pectoris: Secondary | ICD-10-CM | POA: Diagnosis not present

## 2019-03-27 DIAGNOSIS — I5033 Acute on chronic diastolic (congestive) heart failure: Secondary | ICD-10-CM | POA: Diagnosis not present

## 2019-03-27 DIAGNOSIS — J439 Emphysema, unspecified: Secondary | ICD-10-CM | POA: Diagnosis not present

## 2019-03-27 DIAGNOSIS — I4821 Permanent atrial fibrillation: Secondary | ICD-10-CM | POA: Diagnosis not present

## 2019-04-01 DIAGNOSIS — J9611 Chronic respiratory failure with hypoxia: Secondary | ICD-10-CM | POA: Diagnosis not present

## 2019-04-01 DIAGNOSIS — D638 Anemia in other chronic diseases classified elsewhere: Secondary | ICD-10-CM | POA: Diagnosis not present

## 2019-04-01 DIAGNOSIS — I5033 Acute on chronic diastolic (congestive) heart failure: Secondary | ICD-10-CM | POA: Diagnosis not present

## 2019-04-01 DIAGNOSIS — J90 Pleural effusion, not elsewhere classified: Secondary | ICD-10-CM | POA: Diagnosis not present

## 2019-04-02 ENCOUNTER — Encounter (HOSPITAL_COMMUNITY): Payer: Medicare Other

## 2019-04-03 DIAGNOSIS — I110 Hypertensive heart disease with heart failure: Secondary | ICD-10-CM | POA: Diagnosis not present

## 2019-04-03 DIAGNOSIS — E1151 Type 2 diabetes mellitus with diabetic peripheral angiopathy without gangrene: Secondary | ICD-10-CM | POA: Diagnosis not present

## 2019-04-03 DIAGNOSIS — I25110 Atherosclerotic heart disease of native coronary artery with unstable angina pectoris: Secondary | ICD-10-CM | POA: Diagnosis not present

## 2019-04-03 DIAGNOSIS — I4821 Permanent atrial fibrillation: Secondary | ICD-10-CM | POA: Diagnosis not present

## 2019-04-03 DIAGNOSIS — I5033 Acute on chronic diastolic (congestive) heart failure: Secondary | ICD-10-CM | POA: Diagnosis not present

## 2019-04-03 DIAGNOSIS — J439 Emphysema, unspecified: Secondary | ICD-10-CM | POA: Diagnosis not present

## 2019-04-06 DIAGNOSIS — J439 Emphysema, unspecified: Secondary | ICD-10-CM | POA: Diagnosis not present

## 2019-04-06 DIAGNOSIS — I4821 Permanent atrial fibrillation: Secondary | ICD-10-CM | POA: Diagnosis not present

## 2019-04-06 DIAGNOSIS — E1151 Type 2 diabetes mellitus with diabetic peripheral angiopathy without gangrene: Secondary | ICD-10-CM | POA: Diagnosis not present

## 2019-04-06 DIAGNOSIS — I5033 Acute on chronic diastolic (congestive) heart failure: Secondary | ICD-10-CM | POA: Diagnosis not present

## 2019-04-06 DIAGNOSIS — I25110 Atherosclerotic heart disease of native coronary artery with unstable angina pectoris: Secondary | ICD-10-CM | POA: Diagnosis not present

## 2019-04-06 DIAGNOSIS — I110 Hypertensive heart disease with heart failure: Secondary | ICD-10-CM | POA: Diagnosis not present

## 2019-04-08 DIAGNOSIS — S91102A Unspecified open wound of left great toe without damage to nail, initial encounter: Secondary | ICD-10-CM | POA: Diagnosis not present

## 2019-04-08 DIAGNOSIS — I96 Gangrene, not elsewhere classified: Secondary | ICD-10-CM | POA: Diagnosis not present

## 2019-04-08 DIAGNOSIS — E1151 Type 2 diabetes mellitus with diabetic peripheral angiopathy without gangrene: Secondary | ICD-10-CM | POA: Diagnosis not present

## 2019-04-08 DIAGNOSIS — E11621 Type 2 diabetes mellitus with foot ulcer: Secondary | ICD-10-CM | POA: Diagnosis not present

## 2019-04-08 DIAGNOSIS — L97529 Non-pressure chronic ulcer of other part of left foot with unspecified severity: Secondary | ICD-10-CM | POA: Diagnosis not present

## 2019-04-08 DIAGNOSIS — L89612 Pressure ulcer of right heel, stage 2: Secondary | ICD-10-CM | POA: Diagnosis not present

## 2019-04-08 NOTE — Progress Notes (Signed)
Cardiology Clinic Note   Patient Name: [name] Date of Encounter: 04/09/2019  Primary Care Provider:  Nicoletta Dress, MD Primary Cardiologist:  [name] Ruths, MD  Patient Profile    [name] 85 year old male presents today for follow-up of his hypertension, atrial fibrillation, pulmonary hypertension and mitral stenosis.  Past Medical History    Past Medical History:  Diagnosis Date   Acute myocardial infarction, unspecified site, initial episode of care    Anemia    Anxiety    Aortic stenosis     Atrial fibrillation 06/27/2013   Atrial fibrillation (HCC)    a. permanent, on Coumadin for anticoagulation   BPPV (benign paroxysmal positional vertigo) 2015   CAD (coronary artery disease)    a. s/p CABG in 1994 b. cath in 2015 showing normal LM, 100% LCx and RCA stenosis with 50-60% prox LAD stenosis and 100% mid-LAD stenosis; patent sequential SVG-OM2-OM3 and patent LIMA-LAD with PTCA of distal LAD via LIMA graft performed at that time   Carotid stenosis    CAROTID STENOSIS- moderate 09/09/2008   Qualifier: Diagnosis of  By: [name]     Cellulitis 11/15/2018   Chest pain with moderate risk of acute coronary syndrome 06/27/2013   CHF (congestive heart failure) (HCC)    Chronic anticoagulation 06/27/2013   COPD (chronic obstructive pulmonary disease) ([place])    Depression    Diabetes mellitus without complication ([place])    FASTING 98-120S   GERD (gastroesophageal reflux disease)    Gout attack- on steroid dose pack 06/27/2013   History of blood transfusion    History of peptic ulcer disease    Hyperlipidemia    Hypertension    Left atrial severely dilated 01/19/2019   Myocardial infarction ([place])    X2   NSTEMI (non-ST elevated myocardial infarction) ([place]) 08/2013   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 ([place]) 06/04/2013   PEPTIC ULCER DISEASE, HX OF 09/09/2008   Qualifier: Diagnosis  of  By: [name]     PERIPHERAL VASCULAR DISEASE 09/09/2008   Qualifier: Diagnosis of  By: [name]     Peripheral vascular disease ([place])    Pneumonia    HX OF   Presence of indwelling Foley catheter 01/20/2019   Pressure injury of skin 12/17/2018   Renal artery stenosis (HCC)    S/P thoracentesis    Severe aortic stenosis 09/17/2017   Sleep apnea    OXYGEN AT NIGHT 2L [place]   Stress fracture of calcaneus 2018   [name] ([facility])   Tachycardia- beta blocker increased 06/26/2013   UNSPECIFIED ANEMIA 09/09/2008   Qualifier: Diagnosis of  By: [name]     UNSPECIFIED CEREBROVASCULAR DISEASE 09/09/2008   Qualifier: Diagnosis of  By: [name]     Unstable angina ([place]) 08/31/2013   Vertigo 06/27/2013   Past Surgical History:  Procedure Laterality Date   AORTIC ARCH ANGIOGRAPHY N/A 08/14/2017   Procedure: AORTIC ARCH ANGIOGRAPHY;  Surgeon: [name] Mocha, MD;  Location: [name];  Service: Cardiovascular;  Laterality: N/A;   BOWEL RESECTION  07/2013   Bowel perforation   CARDIAC CATHETERIZATION     CAROTID ENDARTERECTOMY Right 2014   [name] Jews, MD   COLON SURGERY     CORONARY ARTERY BYPASS GRAFT  1994   ENDARTERECTOMY Right 01/05/2013   Procedure: ENDARTERECTOMY CAROTID;  Surgeon: [name] Posner, MD;  Location: [place];  Service: Vascular;  Laterality: Right;   INCISION AND DRAINAGE Left 11/16/2018  Procedure: INCISION AND DRAINAGE LEFT FOREARM/ARM;  Surgeon: [name] Cover, MD;  Location: [place];  Service: Orthopedics;  Laterality: Left;   KNEE SURGERY  08/2003   left knee/ARTHROSCOPIC   LEFT HEART CATHETERIZATION WITH CORONARY/GRAFT ANGIOGRAM N/A 09/01/2013   Procedure: LEFT HEART CATHETERIZATION WITH [name];  Surgeon: [name] Grooms, MD;  Location: [facility];  Service: Cardiovascular;  Laterality: N/A;   PATCH ANGIOPLASTY Right 01/05/2013   Procedure: PATCH ANGIOPLASTY;  Surgeon: [name] Posner, MD;  Location: [place];  Service: Vascular;  Laterality: Right;   PERCUTANEOUS CORONARY STENT INTERVENTION (PCI-S)  09/01/2013   Procedure: PERCUTANEOUS CORONARY STENT INTERVENTION (PCI-S);  Surgeon: [name] Grooms, MD;  Location: [facility] CATH LAB;  Service: Cardiovascular;;   removal of bleeding ulcer  1994   RENAL ARTERY STENT     stenting of the left renal artery as well as a cutting balloon angioplasty for treatment of in-stent restenosis coronary artery bypass grafting in 1994.   RIGHT/LEFT HEART CATH AND CORONARY/GRAFT ANGIOGRAPHY N/A 08/14/2017   Procedure: RIGHT/LEFT HEART CATH AND CORONARY/GRAFT ANGIOGRAPHY;  Surgeon: [name] Mocha, MD;  Location: [place] CV LAB;  Service: Cardiovascular;  Laterality: N/A;   TEE WITHOUT CARDIOVERSION N/A 09/17/2017   Procedure: TRANSESOPHAGEAL ECHOCARDIOGRAM (TEE);  Surgeon: [name] Mocha, MD;  Location: [place];  Service: Open Heart Surgery;  Laterality: N/A;    Allergies  Allergies  Allergen Reactions   Bactrim [Sulfamethoxazole-Trimethoprim] Other (See Comments)    "Allergic," per [facility]   Levaquin [Levofloxacin In D5w] Diarrhea and Other (See Comments)    "Allergic," per [facility]    History of Present Illness    [name] has a past medical history of systolic and diastolic CHF, coronary artery disease status post CABG, permanent atrial fibrillation, aortic stenosis status post TAVR, carotid artery disease status post right carotid endarterectomy in 2004, peripheral vascular disease, mitral stenosis with regurgitation, pleural effusion status post multiple thoracentesis, chronic urinary retention with an indwelling Foley catheter, and COPD on home oxygen (3 L).  He was recently seen by [name] Sims, DNP on 03/11/2019.  He was being seen as a post hospital follow-up for acute on chronic systolic and diastolic CHF.  During his hospitalization he was transitioned from Lasix 80 mg twice daily to torsemide 40 mg twice daily.  During his hospitalization he had  runs of nonsustained VT and his metoprolol was increased to 25 mg twice daily.  His Eliquis 5 mg twice daily was continued for atrial fibrillation and he was found to have chronic anemia, and a positive Hemoccult.  His hemoglobin was 8.4.  GI was consulted and recommended conservative therapy only.  Patient is noted to have DNR status.  During his previous clinic visit he presented with his wife is caring for him at home.  She was not satisfied with treatment/care he received at skilled nursing facility.  She is a retired Therapist, sports and provides his daily weights, vitals, medications and was treating a hematoma on his great toe with Epson salts.  During the time of visit the area had become softer and began to drain.  They also indicated that clinic visit by they did not wish to proceed with further carotid Doppler studies at this time.  During his 03/11/2019 clinic visit he was feeling much better, his breathing was less labored, he had no problems with lower extremity edema, and was following a strict low-sodium diet.  He denied dizziness, chest discomfort, myalgia, and unusual bleeding.  They indicated that  they had follow-up blood work scheduled to be taken by the primary care physician within the next few weeks.  On 03/23/2019 Mr. Goodlin became increasingly short of breath, presented to the emergency department, and was diagnosed with acute on chronic diastolic CHF.  His oxygen consumption had increased from his typical 3L to 6 L.  He was admitted to the hospital received IV diuretics.  He diuresed in the neighborhood of 2 pounds.  His weight at discharge was 167.7 pounds and his weight today is 165 pounds  He presents to the clinic today and states his breathing is much better.  He has been compliant with his low-sodium diet.  However he admits to using no salt substitute found on his breakfast and sometimes on his sandwiches.  Him and his wife were educated about high potassium content seasoning.  When  asked about daily weights and sodium restriction they were adamant that they were following these instructions closely.  He presented to his PCP on 04/01/2019 after discharge from the hospital where follow-up labs were taken.  He has no complaints at this time and already has already scheduled follow-up with  [name]. Stanford Breed in early December.  He denies chest pain, shortness of breath, lower extremity edema, fatigue, palpitations, melena, hematuria, hemoptysis, diaphoresis, weakness, presyncope, syncope, orthopnea, and PND.    Home Medications    Prior to Admission medications   Medication Sig Start Date End Date Taking? Authorizing Provider  acyclovir (ZOVIRAX) 800 MG tablet Take 400 mg by mouth 2 (two) times daily.    [provider]  allopurinol (ZYLOPRIM) 300 MG tablet Take 300 mg by mouth daily.    [provider]  apixaban (ELIQUIS) 5 MG TABS tablet Take 1 tablet (5 mg total) by mouth 2 (two) times daily. 12/22/18 03/23/19  Lurline Del, DO  atorvastatin (LIPITOR) 80 MG tablet Take 1 tablet (80 mg total) by mouth at bedtime. 12/07/14   Lelon Perla, MD  brimonidine (ALPHAGAN) 0.2 % ophthalmic solution Place 1 drop into both eyes 2 (two) times daily.    [provider]  budesonide-formoterol (SYMBICORT) 80-4.5 MCG/ACT inhaler Inhale 1 puff into the lungs daily as needed ("for shortness of breath or wheezing- rinse mouth afterwards").  09/05/18   [provider]  Cholecalciferol (VITAMIN D) 50 MCG (2000 UT) tablet Take 1 tablet by mouth daily. 02/13/19   [provider]  digoxin (LANOXIN) 0.125 MG tablet Take 1 tablet (0.125 mg total) by mouth daily. 01/29/19   Shirley, [place], DO  ferrous sulfate 325 (65 FE) MG tablet Take 1 tablet (325 mg total) by mouth daily with breakfast. 02/22/19   Domenic Polite, MD  insulin aspart (NOVOLOG FLEXPEN) 100 UNIT/ML FlexPen Inject 2 Units into the skin See admin instructions. Inject 2 units into the skin at 9 AM in  the morning as needed for a BGL >200    [provider]  lansoprazole (PREVACID) 30 MG capsule Take 30 mg by mouth daily at 12 noon.    [provider]  levalbuterol Penne Lash) 0.63 MG/3ML nebulizer solution Take 3 mLs (0.63 mg total) by nebulization every 6 (six) hours as needed for wheezing or shortness of breath. Patient taking differently: Take 0.63 mg by nebulization 3 (three) times daily.  12/22/18   Lurline Del, DO  Melatonin 5 MG TABS Take 5 mg by mouth at bedtime.    [provider]  metFORMIN (GLUCOPHAGE-XR) 500 MG 24 hr tablet Take 500 mg by mouth at bedtime.  12/10/18  [provider]  metoprolol tartrate (LOPRESSOR) 25 MG tablet Take 1 tablet (25 mg total) by mouth 2 (two) times daily. 02/22/19   Domenic Polite, MD  Multiple Vitamin (MULTIVITAMIN WITH MINERALS) TABS Take 1 tablet by mouth daily.    [provider]  NON FORMULARY Take 120 mLs by mouth See admin instructions. MedPass: Drink 120 ml's by mouth two times a day    [provider]  OXYGEN Inhale 3 L/min into the lungs continuous. TO MAINTAIN SATS <90%    [provider]  polyethylene glycol (MIRALAX / GLYCOLAX) 17 g packet Take 17 g by mouth 2 (two) times daily. 12/22/18   Lurline Del, DO  potassium chloride (KLOR-CON M15) 15 MEQ tablet Take 2 tablets (30 mEq total) by mouth 2 (two) times daily. 01/28/19   Shirley, [place], DO  spironolactone (ALDACTONE) 25 MG tablet Take 25 mg by mouth daily.    [provider]  tamsulosin (FLOMAX) 0.4 MG CAPS capsule Take 1 capsule (0.4 mg total) by mouth daily after supper. Patient taking differently: Take 0.4 mg by mouth every evening.  12/22/18   Lurline Del, DO  torsemide (DEMADEX) 20 MG tablet Take 2 tablets (40 mg total) by mouth 2 (two) times daily. Take additional 20 mg if S OB, edema or > 2 lbs weight gain 03/26/19   Wendee Beavers T, MD  trolamine salicylate (ASPERCREME) 10 % cream Apply 1 application topically  every 6 (six) hours as needed for muscle pain.     [provider]  umeclidinium bromide (INCRUSE ELLIPTA) 62.5 MCG/INH AEPB Inhale 1 puff into the lungs daily. 11/27/18   Matilde Haymaker, MD    Family History    Family History  Problem Relation Age of Onset   Coronary artery disease Mother    Heart disease Mother        Before age 56   Hyperlipidemia Mother    Hypertension Mother    Heart attack Mother    Coronary artery disease Father    Heart disease Father        After age 73   Heart attack Father    Heart disease Sister        After age 70   Heart disease Brother        After age 91   Coronary artery disease Other        71 sibling, almost all have coronary disease and some with premature onset   Heart disease Other        13 sibling, almost all have coronary disease and some with premature onset   He indicated that his mother is deceased. He indicated that his father is deceased. He indicated that his sister is deceased. He indicated that his brother is deceased. He indicated that his maternal grandmother is deceased. He indicated that his maternal grandfather is deceased. He indicated that his paternal grandmother is deceased. He indicated that his paternal grandfather is deceased. He indicated that the status of his other is unknown.  Social History    Social History   Socioeconomic History   Marital status: Married    Spouse name: Not on file   Number of children: Not on file   Years of education: Not on file   Highest education level: Not on file  Occupational History   Not on file  Social Needs   Financial resource strain: Not on file   Food insecurity    Worry: Not on file    Inability:  Not on file   Transportation needs    Medical: Not on file    Non-medical: Not on file  Tobacco Use   Smoking status: Former Smoker    Quit date: 04/27/1993    Years since quitting: 25.9   Smokeless tobacco: Never Used  Substance and Sexual  Activity   Alcohol use: No    Alcohol/week: 0.0 standard drinks   Drug use: No   Sexual activity: Yes  Lifestyle   Physical activity    Days per week: Not on file    Minutes per session: Not on file   Stress: Not on file  Relationships   Social connections    Talks on phone: Not on file    Gets together: Not on file    Attends religious service: Not on file    Active member of club or organization: Not on file    Attends meetings of clubs or organizations: Not on file    Relationship status: Not on file   Intimate partner violence    Fear of current or ex partner: Not on file    Emotionally abused: Not on file    Physically abused: Not on file    Forced sexual activity: Not on file  Other Topics Concern   Not on file  Social History Narrative   Social History:   Married    Tobacco Use - No.    Alcohol Use - yes         Family History:   Mother died at age 15 of coronary disease.  Father died at 43 of coronary disease.  He has 13 siblings, almost all of whom have coronary disease and some with premature onset.     Review of Systems    General:  No chills, fever, night sweats or weight changes.  Cardiovascular:  No chest pain, dyspnea on exertion, edema, orthopnea, palpitations, paroxysmal nocturnal dyspnea. Dermatological: No rash, lesions/masses Respiratory: No cough, dyspnea Urologic: No hematuria, dysuria Abdominal:   No nausea, vomiting, diarrhea, bright red blood per rectum, melena, or hematemesis Neurologic:  No visual changes, wkns, changes in mental status. All other systems reviewed and are otherwise negative except as noted above.  Physical Exam    VS:  BP (!) 101/50    Pulse (!) 56    Ht 5\' 11"  (1.803 m)    Wt 165 lb (74.8 kg)    SpO2 98%    BMI 23.01 kg/m  , BMI Body mass index is 23.01 kg/m. GEN: Well nourished, well developed, in no acute distress. HEENT: normal. Neck: Supple, no JVD, carotid bruits, or masses. Cardiac: RRR, no murmurs,  rubs, or gallops. No clubbing, cyanosis, edema.  Radials/DP/PT 2+ and equal bilaterally.  Respiratory:  Respirations regular and unlabored, clear to auscultation bilaterally. GI: Soft, nontender, nondistended, BS + x 4. MS: no deformity or atrophy. Skin: warm and dry, no rash. Neuro:  Strength and sensation are intact. Psych: Normal affect.  Accessory Clinical Findings    ECG personally reviewed by me today-none today  EKG 03/11/2019 Atrial fibrillation 68 bpm T wave abnormalities in leads II, 3, aVF with T wave inversion noted in V5 and V6.  Echocardiogram 01/01/2019  . The left ventricle has low normal systolic function, with an ejection fraction of 50-55%. The cavity size was normal. There is mildly increased left ventricular wall thickness. Left ventricular diastolic Doppler parameters are indeterminate. There is abnormal septal motion consistent with post-operative status. 2. The right ventricle has mildly reduced systolic  function. The cavity was normal. There is no increase in right ventricular wall thickness. Right ventricular systolic pressure is moderately elevated with an estimated pressure of 55.2 mmHg. 3. Left atrial size was severely dilated. 4. A 26 Edwards Sapien bioprosthetic aortic valve (TAVR) valve is present in the aortic position. Procedure Date: 09/17/17 Echo findings shows no evidence of rocking, dehiscence. Mean systolic gradient 11 mmHg, calculated valve area approximately 2 cm2  using LVOT diameter of 2.6 cm and LVOT TVI 14 cm. Likely trivial prosthetic aortic valve regurgitation, though challenging to assess whether this is prosthetic or paravalvular due to image quality. 5. The mitral valve is abnormal. There is moderate mitral annular calcification present. Mitral valve regurgitation is mild to moderate by color flow Doppler. Moderate-severe mitral valve stenosis. Mean diastolic gradient 13 mmHg at HR 100-105. 6. Tricuspid valve regurgitation is moderate. 7.  When compared to the prior study: 11/24/2018 - no significant change in LV function. No change in degree of mitral valve stenosis. Stable function of aortic prosthesis. Side by side comparison of images performed.  Assessment & Plan   Acute systolic and diastolic CHF-euvolemic today.   Continue torsemide 40 mg twice daily-okay to take an additional 20 mg for shortness of breath, edema or weight gain greater than 2 pounds. Continue digoxin 0.125 mg tablet daily Continue spironolactone 25 mg tablet daily Dry weight between 165 and 167 pounds. Heart healthy low-sodium diet-salty 6 given Request follow-up labs from PCP  Atrial fibrillation-heart rate today 56.  Denies palpitations or flutters. Continue metoprolol 25 mg twice daily Continue digoxin 0.125 mg daily Continue apixaban 5 mg tablet twice daily  Coronary artery disease-no chest pain today.  CABG in 1994, PTCA of the distal LAD via LIMA graft in 2010. Continue atorvastatin 80 mg tablet daily Continue metoprolol tartrate 25 mg tablet twice daily Heart healthy low-sodium diet Increase physical activity as tolerated  Bilateral pleural effusion-no increased shortness of breath today.  He continues to use 3 L of O2 and oxygen saturation is 98%.   Hypercholesterolemia-LDL 69 11/05/2018. Continue atorvastatin 80 mg daily Heart healthy low-sodium high-fiber diet Increase physical activity as tolerated  Disposition: Follow-up with [name]. Stanford Breed in December.   Jossie Ng. [facility] [place] [address] Office [phone] Fax [phone]

## 2019-04-09 ENCOUNTER — Other Ambulatory Visit: Payer: Self-pay

## 2019-04-09 ENCOUNTER — Encounter: Payer: Self-pay | Admitting: General Practice

## 2019-04-09 ENCOUNTER — Ambulatory Visit (INDEPENDENT_AMBULATORY_CARE_PROVIDER_SITE_OTHER): Payer: Medicare Other | Admitting: General Practice

## 2019-04-09 VITALS — BP 101/50 | HR 56 | Ht 71.0 in | Wt 165.0 lb

## 2019-04-09 DIAGNOSIS — J90 Pleural effusion, not elsewhere classified: Secondary | ICD-10-CM

## 2019-04-09 DIAGNOSIS — I2510 Atherosclerotic heart disease of native coronary artery without angina pectoris: Secondary | ICD-10-CM

## 2019-04-09 DIAGNOSIS — I4821 Permanent atrial fibrillation: Secondary | ICD-10-CM | POA: Diagnosis not present

## 2019-04-09 DIAGNOSIS — I5042 Chronic combined systolic (congestive) and diastolic (congestive) heart failure: Secondary | ICD-10-CM | POA: Diagnosis not present

## 2019-04-09 DIAGNOSIS — E7800 Pure hypercholesterolemia, unspecified: Secondary | ICD-10-CM

## 2019-04-09 NOTE — Patient Instructions (Signed)
Special Instructions: PLEASE FOLLOW SALTY 6 HANDOUT THAT IS ATTACHED  Follow-Up: KEEP DR CRENSHAW APPOINTMENT THAT IS SCHEDULED.  Medication Instructions:  The current medical regimen is effective;  continue present plan and medications as directed. Please refer to the Current Medication list given to you today. If you need a refill on your cardiac medications before your next appointment, please call your pharmacy.  At [facility], you and your health needs are our priority.  As part of our continuing mission to provide you with exceptional heart care, we have created designated Provider Care Teams.  These Care Teams include your primary Cardiologist (physician) and Advanced Practice Providers (APPs -  Physician Assistants and Nurse Practitioners) who all work together to provide you with the care you need, when you need it.  Thank you for choosing [facility] at [facility]!!

## 2019-04-10 DIAGNOSIS — L97529 Non-pressure chronic ulcer of other part of left foot with unspecified severity: Secondary | ICD-10-CM | POA: Diagnosis not present

## 2019-04-10 DIAGNOSIS — E11621 Type 2 diabetes mellitus with foot ulcer: Secondary | ICD-10-CM | POA: Diagnosis not present

## 2019-04-10 DIAGNOSIS — L97409 Non-pressure chronic ulcer of unspecified heel and midfoot with unspecified severity: Secondary | ICD-10-CM | POA: Diagnosis not present

## 2019-04-13 DIAGNOSIS — N390 Urinary tract infection, site not specified: Secondary | ICD-10-CM | POA: Diagnosis not present

## 2019-04-22 DIAGNOSIS — M79641 Pain in right hand: Secondary | ICD-10-CM | POA: Diagnosis not present

## 2019-04-22 DIAGNOSIS — M25441 Effusion, right hand: Secondary | ICD-10-CM | POA: Diagnosis not present

## 2019-04-22 DIAGNOSIS — M7989 Other specified soft tissue disorders: Secondary | ICD-10-CM | POA: Diagnosis not present

## 2019-04-22 DIAGNOSIS — M25531 Pain in right wrist: Secondary | ICD-10-CM | POA: Diagnosis not present

## 2019-04-22 DIAGNOSIS — M19041 Primary osteoarthritis, right hand: Secondary | ICD-10-CM | POA: Diagnosis not present

## 2019-04-22 DIAGNOSIS — M25431 Effusion, right wrist: Secondary | ICD-10-CM | POA: Diagnosis not present

## 2019-04-23 DIAGNOSIS — E11621 Type 2 diabetes mellitus with foot ulcer: Secondary | ICD-10-CM | POA: Diagnosis not present

## 2019-04-23 DIAGNOSIS — I700 Atherosclerosis of aorta: Secondary | ICD-10-CM | POA: Diagnosis not present

## 2019-04-28 ENCOUNTER — Other Ambulatory Visit (HOSPITAL_COMMUNITY): Payer: Self-pay | Admitting: Interventional Radiology

## 2019-04-28 DIAGNOSIS — I998 Other disorder of circulatory system: Secondary | ICD-10-CM

## 2019-04-28 DIAGNOSIS — I70229 Atherosclerosis of native arteries of extremities with rest pain, unspecified extremity: Secondary | ICD-10-CM

## 2019-04-29 ENCOUNTER — Other Ambulatory Visit: Payer: Self-pay

## 2019-04-29 ENCOUNTER — Inpatient Hospital Stay (HOSPITAL_COMMUNITY)
Admission: EM | Admit: 2019-04-29 | Discharge: 2019-05-01 | DRG: 292 | Disposition: A | Discharge status: Home Health | Payer: Medicare Other | Attending: Internal Medicine | Admitting: Internal Medicine

## 2019-04-29 ENCOUNTER — Emergency Department (HOSPITAL_COMMUNITY): Payer: Medicare Other

## 2019-04-29 ENCOUNTER — Encounter (HOSPITAL_COMMUNITY): Payer: Self-pay

## 2019-04-29 DIAGNOSIS — Z20828 Contact with and (suspected) exposure to other viral communicable diseases: Secondary | ICD-10-CM | POA: Diagnosis present

## 2019-04-29 DIAGNOSIS — I4891 Unspecified atrial fibrillation: Secondary | ICD-10-CM | POA: Diagnosis present

## 2019-04-29 DIAGNOSIS — R55 Syncope and collapse: Secondary | ICD-10-CM | POA: Diagnosis not present

## 2019-04-29 DIAGNOSIS — Z86711 Personal history of pulmonary embolism: Secondary | ICD-10-CM

## 2019-04-29 DIAGNOSIS — E119 Type 2 diabetes mellitus without complications: Secondary | ICD-10-CM

## 2019-04-29 DIAGNOSIS — Z951 Presence of aortocoronary bypass graft: Secondary | ICD-10-CM | POA: Diagnosis not present

## 2019-04-29 DIAGNOSIS — Z9981 Dependence on supplemental oxygen: Secondary | ICD-10-CM

## 2019-04-29 DIAGNOSIS — E785 Hyperlipidemia, unspecified: Secondary | ICD-10-CM | POA: Diagnosis present

## 2019-04-29 DIAGNOSIS — Z8679 Personal history of other diseases of the circulatory system: Secondary | ICD-10-CM

## 2019-04-29 DIAGNOSIS — Z8249 Family history of ischemic heart disease and other diseases of the circulatory system: Secondary | ICD-10-CM

## 2019-04-29 DIAGNOSIS — I10 Essential (primary) hypertension: Secondary | ICD-10-CM | POA: Diagnosis present

## 2019-04-29 DIAGNOSIS — Z66 Do not resuscitate: Secondary | ICD-10-CM | POA: Diagnosis present

## 2019-04-29 DIAGNOSIS — E871 Hypo-osmolality and hyponatremia: Secondary | ICD-10-CM | POA: Diagnosis present

## 2019-04-29 DIAGNOSIS — Z881 Allergy status to other antibiotic agents status: Secondary | ICD-10-CM

## 2019-04-29 DIAGNOSIS — J449 Chronic obstructive pulmonary disease, unspecified: Secondary | ICD-10-CM | POA: Diagnosis present

## 2019-04-29 DIAGNOSIS — Z7901 Long term (current) use of anticoagulants: Secondary | ICD-10-CM | POA: Diagnosis not present

## 2019-04-29 DIAGNOSIS — Z7951 Long term (current) use of inhaled steroids: Secondary | ICD-10-CM

## 2019-04-29 DIAGNOSIS — I272 Pulmonary hypertension, unspecified: Secondary | ICD-10-CM | POA: Diagnosis present

## 2019-04-29 DIAGNOSIS — I2510 Atherosclerotic heart disease of native coronary artery without angina pectoris: Secondary | ICD-10-CM | POA: Diagnosis present

## 2019-04-29 DIAGNOSIS — Z79899 Other long term (current) drug therapy: Secondary | ICD-10-CM

## 2019-04-29 DIAGNOSIS — J9611 Chronic respiratory failure with hypoxia: Secondary | ICD-10-CM | POA: Diagnosis present

## 2019-04-29 DIAGNOSIS — Z9049 Acquired absence of other specified parts of digestive tract: Secondary | ICD-10-CM

## 2019-04-29 DIAGNOSIS — I5031 Acute diastolic (congestive) heart failure: Secondary | ICD-10-CM | POA: Diagnosis not present

## 2019-04-29 DIAGNOSIS — I252 Old myocardial infarction: Secondary | ICD-10-CM

## 2019-04-29 DIAGNOSIS — I5033 Acute on chronic diastolic (congestive) heart failure: Secondary | ICD-10-CM | POA: Diagnosis present

## 2019-04-29 DIAGNOSIS — D649 Anemia, unspecified: Secondary | ICD-10-CM | POA: Diagnosis not present

## 2019-04-29 DIAGNOSIS — G4733 Obstructive sleep apnea (adult) (pediatric): Secondary | ICD-10-CM | POA: Diagnosis present

## 2019-04-29 DIAGNOSIS — R0602 Shortness of breath: Secondary | ICD-10-CM

## 2019-04-29 DIAGNOSIS — E1165 Type 2 diabetes mellitus with hyperglycemia: Secondary | ICD-10-CM

## 2019-04-29 DIAGNOSIS — I4821 Permanent atrial fibrillation: Secondary | ICD-10-CM | POA: Diagnosis present

## 2019-04-29 DIAGNOSIS — I052 Rheumatic mitral stenosis with insufficiency: Secondary | ICD-10-CM | POA: Diagnosis present

## 2019-04-29 DIAGNOSIS — G9340 Encephalopathy, unspecified: Secondary | ICD-10-CM | POA: Diagnosis present

## 2019-04-29 DIAGNOSIS — D638 Anemia in other chronic diseases classified elsewhere: Secondary | ICD-10-CM | POA: Diagnosis present

## 2019-04-29 DIAGNOSIS — I447 Left bundle-branch block, unspecified: Secondary | ICD-10-CM | POA: Diagnosis present

## 2019-04-29 DIAGNOSIS — E1152 Type 2 diabetes mellitus with diabetic peripheral angiopathy with gangrene: Secondary | ICD-10-CM | POA: Diagnosis present

## 2019-04-29 DIAGNOSIS — I110 Hypertensive heart disease with heart failure: Secondary | ICD-10-CM | POA: Diagnosis present

## 2019-04-29 DIAGNOSIS — Z952 Presence of prosthetic heart valve: Secondary | ICD-10-CM | POA: Diagnosis not present

## 2019-04-29 DIAGNOSIS — I701 Atherosclerosis of renal artery: Secondary | ICD-10-CM | POA: Diagnosis present

## 2019-04-29 DIAGNOSIS — R339 Retention of urine, unspecified: Secondary | ICD-10-CM | POA: Diagnosis present

## 2019-04-29 DIAGNOSIS — Z960 Presence of urogenital implants: Secondary | ICD-10-CM | POA: Diagnosis present

## 2019-04-29 DIAGNOSIS — Z955 Presence of coronary angioplasty implant and graft: Secondary | ICD-10-CM

## 2019-04-29 DIAGNOSIS — R404 Transient alteration of awareness: Secondary | ICD-10-CM | POA: Diagnosis not present

## 2019-04-29 DIAGNOSIS — R0902 Hypoxemia: Secondary | ICD-10-CM | POA: Diagnosis not present

## 2019-04-29 DIAGNOSIS — Z794 Long term (current) use of insulin: Secondary | ICD-10-CM

## 2019-04-29 DIAGNOSIS — Z87891 Personal history of nicotine dependence: Secondary | ICD-10-CM

## 2019-04-29 DIAGNOSIS — M109 Gout, unspecified: Secondary | ICD-10-CM | POA: Diagnosis present

## 2019-04-29 DIAGNOSIS — Z882 Allergy status to sulfonamides status: Secondary | ICD-10-CM

## 2019-04-29 DIAGNOSIS — D509 Iron deficiency anemia, unspecified: Secondary | ICD-10-CM | POA: Diagnosis present

## 2019-04-29 DIAGNOSIS — R4182 Altered mental status, unspecified: Secondary | ICD-10-CM | POA: Diagnosis not present

## 2019-04-29 DIAGNOSIS — Z95828 Presence of other vascular implants and grafts: Secondary | ICD-10-CM

## 2019-04-29 DIAGNOSIS — Z95820 Peripheral vascular angioplasty status with implants and grafts: Secondary | ICD-10-CM

## 2019-04-29 DIAGNOSIS — Z8349 Family history of other endocrine, nutritional and metabolic diseases: Secondary | ICD-10-CM

## 2019-04-29 DIAGNOSIS — J411 Mucopurulent chronic bronchitis: Secondary | ICD-10-CM | POA: Diagnosis not present

## 2019-04-29 DIAGNOSIS — K219 Gastro-esophageal reflux disease without esophagitis: Secondary | ICD-10-CM | POA: Diagnosis present

## 2019-04-29 DIAGNOSIS — I509 Heart failure, unspecified: Secondary | ICD-10-CM

## 2019-04-29 DIAGNOSIS — Z79890 Hormone replacement therapy (postmenopausal): Secondary | ICD-10-CM

## 2019-04-29 LAB — CBG MONITORING, ED
Glucose-Capillary: 141 mg/dL — ABNORMAL HIGH (ref 70–99)
Glucose-Capillary: 96 mg/dL (ref 70–99)

## 2019-04-29 LAB — BASIC METABOLIC PANEL
Anion gap: 13 (ref 5–15)
BUN: 27 mg/dL — ABNORMAL HIGH (ref 8–23)
CO2: 31 mmol/L (ref 22–32)
Calcium: 8.9 mg/dL (ref 8.9–10.3)
Chloride: 87 mmol/L — ABNORMAL LOW (ref 98–111)
Creatinine, Ser: 1.07 mg/dL (ref 0.61–1.24)
GFR calc Af Amer: >60 mL/min (ref >60)
GFR calc non Af Amer: >60 mL/min (ref >60)
Glucose, Bld: 159 mg/dL — ABNORMAL HIGH (ref 70–99)
Potassium: 4.1 mmol/L (ref 3.5–5.1)
Sodium: 131 mmol/L — ABNORMAL LOW (ref 135–145)

## 2019-04-29 LAB — PHOSPHORUS: Phosphorus: 3.6 mg/dL (ref 2.5–4.6)

## 2019-04-29 LAB — CBC
HCT: 25.1 % — ABNORMAL LOW (ref 39.0–52.0)
Hemoglobin: 8 g/dL — ABNORMAL LOW (ref 13.0–17.0)
MCH: 28.2 pg (ref 26.0–34.0)
MCHC: 31.9 g/dL (ref 30.0–36.0)
MCV: 88.4 fL (ref 80.0–100.0)
Platelets: 349 10*3/uL (ref 150–400)
RBC: 2.84 MIL/uL — ABNORMAL LOW (ref 4.22–5.81)
RDW: 17.3 % — ABNORMAL HIGH (ref 11.5–15.5)
WBC: 10.3 10*3/uL (ref 4.0–10.5)
nRBC: 0 % (ref 0.0–0.2)

## 2019-04-29 LAB — POCT I-STAT 7, (LYTES, BLD GAS, ICA,H+H)
Acid-Base Excess: 10 mmol/L — ABNORMAL HIGH (ref 0.0–2.0)
Bicarbonate: 34.1 mmol/L — ABNORMAL HIGH (ref 20.0–28.0)
Calcium, Ion: 1.17 mmol/L (ref 1.15–1.40)
HCT: 23 % — ABNORMAL LOW (ref 39.0–52.0)
Hemoglobin: 7.8 g/dL — ABNORMAL LOW (ref 13.0–17.0)
O2 Saturation: 99 %
Patient temperature: 97.8
Potassium: 3.9 mmol/L (ref 3.5–5.1)
Sodium: 129 mmol/L — ABNORMAL LOW (ref 135–145)
TCO2: 35 mmol/L — ABNORMAL HIGH (ref 22–32)
pCO2 arterial: 44.7 mmHg (ref 32.0–48.0)
pH, Arterial: 7.489 — ABNORMAL HIGH (ref 7.350–7.450)
pO2, Arterial: 131 mmHg — ABNORMAL HIGH (ref 83.0–108.0)

## 2019-04-29 LAB — MAGNESIUM: Magnesium: 2.2 mg/dL (ref 1.7–2.4)

## 2019-04-29 LAB — SARS CORONAVIRUS 2 (TAT 6-24 HRS): SARS Coronavirus 2: NEGATIVE

## 2019-04-29 LAB — TROPONIN I (HIGH SENSITIVITY)
Troponin I (High Sensitivity): 40 ng/L — ABNORMAL HIGH (ref <18)
Troponin I (High Sensitivity): 36 ng/L — ABNORMAL HIGH (ref <18)

## 2019-04-29 LAB — DIGOXIN LEVEL: Digoxin Level: 1.6 ng/mL (ref 0.8–2.0)

## 2019-04-29 LAB — BRAIN NATRIURETIC PEPTIDE: B Natriuretic Peptide: 276.9 pg/mL — ABNORMAL HIGH (ref 0.0–100.0)

## 2019-04-29 MED ORDER — FUROSEMIDE 10 MG/ML IJ SOLN
40.0000 mg | Freq: Two times a day (BID) | INTRAMUSCULAR | Status: DC
  Administered 2019-04-29 – 2019-04-30 (×2): 40 mg via INTRAVENOUS
  Filled 2019-04-29 (×2): qty 4

## 2019-04-29 MED ORDER — UMECLIDINIUM BROMIDE 62.5 MCG/INH IN AEPB
1.0000 | INHALATION_SPRAY | Freq: Every day | RESPIRATORY_TRACT | Status: DC
  Administered 2019-05-01: 1 via RESPIRATORY_TRACT
  Filled 2019-04-29: qty 7

## 2019-04-29 MED ORDER — ACETAMINOPHEN 325 MG PO TABS
650.0000 mg | ORAL_TABLET | Freq: Four times a day (QID) | ORAL | Status: DC | PRN
  Administered 2019-05-01: 650 mg via ORAL
  Filled 2019-04-29: qty 2

## 2019-04-29 MED ORDER — INSULIN ASPART 100 UNIT/ML ~~LOC~~ SOLN: 0.0000 [IU] | Freq: Every day | SUBCUTANEOUS | Status: DC

## 2019-04-29 MED ORDER — ACETAMINOPHEN 650 MG RE SUPP: 650.0000 mg | Freq: Four times a day (QID) | RECTAL | Status: DC | PRN

## 2019-04-29 MED ORDER — MOMETASONE FURO-FORMOTEROL FUM 100-5 MCG/ACT IN AERO
2.0000 | INHALATION_SPRAY | Freq: Two times a day (BID) | RESPIRATORY_TRACT | Status: DC
  Administered 2019-04-30 – 2019-05-01 (×2): 2 via RESPIRATORY_TRACT
  Filled 2019-04-29: qty 8.8

## 2019-04-29 MED ORDER — ONDANSETRON HCL 4 MG/2ML IJ SOLN: 4.0000 mg | Freq: Four times a day (QID) | INTRAMUSCULAR | Status: DC | PRN

## 2019-04-29 MED ORDER — ONDANSETRON HCL 4 MG PO TABS: 4.0000 mg | ORAL_TABLET | Freq: Four times a day (QID) | ORAL | Status: DC | PRN

## 2019-04-29 MED ORDER — LEVALBUTEROL HCL 0.63 MG/3ML IN NEBU
0.6300 mg | INHALATION_SOLUTION | Freq: Three times a day (TID) | RESPIRATORY_TRACT | Status: DC
  Administered 2019-04-29 – 2019-05-01 (×6): 0.63 mg via RESPIRATORY_TRACT
  Filled 2019-04-29 (×6): qty 3

## 2019-04-29 MED ORDER — INSULIN ASPART 100 UNIT/ML ~~LOC~~ SOLN
0.0000 [IU] | Freq: Three times a day (TID) | SUBCUTANEOUS | Status: DC
  Administered 2019-04-30 – 2019-05-01 (×2): 3 [IU] via SUBCUTANEOUS

## 2019-04-29 NOTE — ED Notes (Signed)
Patient transported to X-ray 

## 2019-04-29 NOTE — ED Triage Notes (Signed)
Pt arrived via [facility] from home for SOB w/exertion. Pt states he was walking from his vehicle to his house and didn't have his 02 on. Pt is on 3L 02 per [place] at baseline at all times. Wife told EMS pt's eyes were open and pt wasn't responding to wife and that lasted 8-81mins. Pt is A&Ox4. Skin color is WNL. Pt is tachypneic. Pt is A-fib w/BBB on monitor

## 2019-04-29 NOTE — H&P (Signed)
History and Physical    [name] EUM:[id number] DOB: 1932/02/05 DOA: 04/29/2019  PCP: Nicoletta Dress, MD  Patient coming from: Home I have personally briefly reviewed patient's old medical records in [place]  Chief Complaint: Altered level of consciousness  HPI: [name] is a 85 y.o. male with medical history significant of coronary artery disease status post CABG, chronic diastolic congestive heart failure, A. fib on Eliquis, aortic stenosis status post TAVR, pulmonary hypertension, COPD on 3 L of oxygen at home, type 2 diabetes mellitus, hypertension, hyperlipidemia, anemia, urinary retention with chronic indwelling Foley catheter, obstructive sleep apnea presents to emergency department due to altered level of consciousness.  Upon my evaluation: Patient alert and oriented x3, communicating well.  Reports that he was walking from his  car to his house and did not have oxygen at that time and suddenly he became short of breath and unresponsive as per wife-the episode lasted for 8 to 10 minutes.  Wife called EMS and brought here for further evaluation and management.  Patient tells me that he had weight gain of 5 pounds recently-his wife who is also a retired Therapist, sports- increased his torsemide dose for 3 days which improved his weight-as per recommendation by physician at the time of previous discharge .  He denies leg swelling, orthopnea, PND today.    Patient has chronic necrotic appearing wound on right and left distal toes.  Reports that he has been followed by wound care outpatient.  No history of fever, chills, bleeding or discharge. As per wife-patient is scheduled with vascular surgery on 7th dec-for stent placement.  Upon arrival to ED: Patient alert and oriented x4, patient denies headache, blurry vision, lightheadedness, dizziness, seizures, head trauma, chest pain, palpitation, leg swelling, nausea, vomiting, diarrhea, decreased appetite, COVID-19 exposure,  weakness, bowel changes.  ED Course: Upon arrival: Patient bradycardic, tachypneic, normal blood glucose level, troponin initial 40 then trended down to 36, BNP 276, digoxin level: WNL, chest x-ray shows fluid overload suggestive of congestive heart failure, CT head without contrast came back negative for ischemic stroke.  EDP consulted neurology and cardiology.  Review of Systems: As per HPI otherwise negative.    Past Medical History:  Diagnosis Date  . Acute myocardial infarction, unspecified site, initial episode of care   . Anemia   . Anxiety   . Aortic stenosis    . Atrial fibrillation 06/27/2013  . Atrial fibrillation ([place])    a. permanent, on Coumadin for anticoagulation  . BPPV (benign paroxysmal positional vertigo) 2015  . CAD (coronary artery disease)    a. s/p CABG in 1994 b. cath in 2015 showing normal LM, 100% LCx and RCA stenosis with 50-60% prox LAD stenosis and 100% mid-LAD stenosis; patent sequential SVG-OM2-OM3 and patent LIMA-LAD with PTCA of distal LAD via LIMA graft performed at that time  . Carotid stenosis   . CAROTID STENOSIS- moderate 09/09/2008   Qualifier: Diagnosis of  By: Burnett Kanaris    . Cellulitis 11/15/2018  . Chest pain with moderate risk of acute coronary syndrome 06/27/2013  . CHF (congestive heart failure) ([place])   . Chronic anticoagulation 06/27/2013  . COPD (chronic obstructive pulmonary disease) ([place])   . Depression   . Diabetes mellitus without complication ([place])    FASTING 98-120S  . GERD (gastroesophageal reflux disease)   . Gout attack- on steroid dose pack 06/27/2013  . History of blood transfusion   . History of peptic ulcer disease   . Hyperlipidemia   .  Hypertension   . Left atrial severely dilated 01/19/2019  . Myocardial infarction ([place])    X2  . NSTEMI (non-ST elevated myocardial infarction) ([place]) 08/2013  . 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 ([place]) 06/04/2013  . PEPTIC ULCER  DISEASE, HX OF 09/09/2008   Qualifier: Diagnosis of  By: Burnett Kanaris    . PERIPHERAL VASCULAR DISEASE 09/09/2008   Qualifier: Diagnosis of  By: Burnett Kanaris    . Peripheral vascular disease ([place])   . Pneumonia    HX OF  . Presence of indwelling Foley catheter 01/20/2019  . Pressure injury of skin 12/17/2018  . Renal artery stenosis ([place])   . S/P thoracentesis   . Severe aortic stenosis 09/17/2017  . Sleep apnea    OXYGEN AT NIGHT 2L [place]  . Stress fracture of calcaneus 2018   Dr Paulla Dolly [facility])  . Tachycardia- beta blocker increased 06/26/2013  . UNSPECIFIED ANEMIA 09/09/2008   Qualifier: Diagnosis of  By: Burnett Kanaris    . UNSPECIFIED CEREBROVASCULAR DISEASE 09/09/2008   Qualifier: Diagnosis of  By: Burnett Kanaris    . Unstable angina ([place]) 08/31/2013  . Vertigo 06/27/2013    Past Surgical History:  Procedure Laterality Date  . AORTIC ARCH ANGIOGRAPHY N/A 08/14/2017   Procedure: AORTIC ARCH ANGIOGRAPHY;  Surgeon: Sherren Mocha, MD;  Location: [place] [facility];  Service: Cardiovascular;  Laterality: N/A;  . BOWEL RESECTION  07/2013   Bowel perforation  . CARDIAC CATHETERIZATION    . CAROTID ENDARTERECTOMY Right 2014   Curt Jews, MD  . COLON SURGERY    . CORONARY ARTERY BYPASS GRAFT  1994  . ENDARTERECTOMY Right 01/05/2013   Procedure: ENDARTERECTOMY CAROTID;  Surgeon: Rosetta Posner, MD;  Location: [place];  Service: Vascular;  Laterality: Right;  . INCISION AND DRAINAGE Left 11/16/2018   Procedure: INCISION AND DRAINAGE LEFT FOREARM/ARM;  Surgeon: Leanora Cover, MD;  Location: [place];  Service: Orthopedics;  Laterality: Left;  . KNEE SURGERY  08/2003   left knee/ARTHROSCOPIC  . LEFT HEART CATHETERIZATION WITH CORONARY/GRAFT ANGIOGRAM N/A 09/01/2013   Procedure: LEFT HEART CATHETERIZATION WITH Beatrix Fetters;  Surgeon: Sinclair Grooms, MD;  [facility];  Service: Cardiovascular;  Laterality: N/A;  . PATCH ANGIOPLASTY Right 01/05/2013   Procedure: PATCH  ANGIOPLASTY;  Surgeon: Rosetta Posner, MD;  Location: [place];  Service: Vascular;  Laterality: Right;  . PERCUTANEOUS CORONARY STENT INTERVENTION (PCI-S)  09/01/2013   Procedure: PERCUTANEOUS CORONARY STENT INTERVENTION (PCI-S);  Surgeon: Sinclair Grooms, MD;  Location: [facility] CATH LAB;  Service: Cardiovascular;;  . removal of bleeding ulcer  1994  . RENAL ARTERY STENT     stenting of the left renal artery as well as a cutting balloon angioplasty for treatment of in-stent restenosis coronary artery bypass grafting in 1994.  [name] RIGHT/LEFT HEART CATH AND CORONARY/GRAFT ANGIOGRAPHY N/A 08/14/2017   Procedure: RIGHT/LEFT HEART CATH AND CORONARY/GRAFT ANGIOGRAPHY;  Surgeon: Sherren Mocha, MD;  Location: [place] [facility];  Service: Cardiovascular;  Laterality: N/A;  . TEE WITHOUT CARDIOVERSION N/A 09/17/2017   Procedure: TRANSESOPHAGEAL ECHOCARDIOGRAM (TEE);  Surgeon: Sherren Mocha, MD;  Location: [place];  Service: Open Heart Surgery;  Laterality: N/A;     reports that he quit smoking about 26 years ago. He has never used smokeless tobacco. He reports that he does not drink alcohol or use drugs.  Allergies  Allergen Reactions  . Bactrim [Sulfamethoxazole-Trimethoprim] Other (See Comments)    "Allergic," per [facility]  .  Levaquin [Levofloxacin In D5w] Diarrhea and Other (See Comments)    "Allergic," per [facility]    Family History  Problem Relation Age of Onset  . Coronary artery disease Mother   . Heart disease Mother        Before age 43  . Hyperlipidemia Mother   . Hypertension Mother   . Heart attack Mother   . Coronary artery disease Father   . Heart disease Father        After age 98  . Heart attack Father   . Heart disease Sister        After age 47  . Heart disease Brother        After age 75  . Coronary artery disease Other        13 sibling, almost all have coronary disease and some with premature onset  . Heart disease Other        13 sibling, almost all have coronary disease and some  with premature onset    Prior to Admission medications   Medication Sig Start Date End Date Taking? Authorizing Provider  acyclovir (ZOVIRAX) 800 MG tablet Take 400 mg by mouth 2 (two) times daily.    [provider]  allopurinol (ZYLOPRIM) 300 MG tablet Take 300 mg by mouth daily.    [provider]  apixaban (ELIQUIS) 5 MG TABS tablet Take 1 tablet (5 mg total) by mouth 2 (two) times daily. 12/22/18 04/09/19  Lurline Del, DO  atorvastatin (LIPITOR) 80 MG tablet Take 1 tablet (80 mg total) by mouth at bedtime. 12/07/14   Lelon Perla, MD  brimonidine (ALPHAGAN) 0.2 % ophthalmic solution Place 1 drop into both eyes 2 (two) times daily.    [provider]  budesonide-formoterol (SYMBICORT) 80-4.5 MCG/ACT inhaler Inhale 1 puff into the lungs daily as needed ("for shortness of breath or wheezing- rinse mouth afterwards").  09/05/18   [provider]  Cholecalciferol (VITAMIN D) 50 MCG (2000 UT) tablet Take 1 tablet by mouth daily. 02/13/19   [provider]  digoxin (LANOXIN) 0.125 MG tablet Take 1 tablet (0.125 mg total) by mouth daily. 01/29/19   Shirley, [place], DO  ferrous sulfate 325 (65 FE) MG tablet Take 1 tablet (325 mg total) by mouth daily with breakfast. 02/22/19   Domenic Polite, MD  insulin aspart (NOVOLOG FLEXPEN) 100 UNIT/ML FlexPen Inject 2 Units into the skin See admin instructions. Inject 2 units into the skin at 9 AM in the morning as needed for a BGL >200    [provider]  lansoprazole (PREVACID) 30 MG capsule Take 30 mg by mouth daily at 12 noon.    [provider]  levalbuterol Penne Lash) 0.63 MG/3ML nebulizer solution Take 3 mLs (0.63 mg total) by nebulization every 6 (six) hours as needed for wheezing or shortness of breath. Patient taking differently: Take 0.63 mg by nebulization 3 (three) times daily.  12/22/18   Lurline Del, DO  Melatonin 5 MG TABS Take 5 mg by mouth at bedtime.    [provider]   metFORMIN (GLUCOPHAGE-XR) 500 MG 24 hr tablet Take 500 mg by mouth at bedtime.  12/10/18   [provider]  metoprolol tartrate (LOPRESSOR) 25 MG tablet Take 1 tablet (25 mg total) by mouth 2 (two) times daily. 02/22/19   Domenic Polite, MD  Multiple Vitamin (MULTIVITAMIN WITH MINERALS) TABS Take 1 tablet by mouth daily.    [provider]  NON FORMULARY Take 120 mLs by mouth See  admin instructions. MedPass: Drink 120 ml's by mouth two times a day    [provider]  OXYGEN Inhale 3 L/min into the lungs continuous. TO MAINTAIN SATS <90%    [provider]  polyethylene glycol (MIRALAX / GLYCOLAX) 17 g packet Take 17 g by mouth 2 (two) times daily. 12/22/18   Lurline Del, DO  potassium chloride (KLOR-CON M15) 15 MEQ tablet Take 2 tablets (30 mEq total) by mouth 2 (two) times daily. 01/28/19   Shirley, [place], DO  spironolactone (ALDACTONE) 25 MG tablet Take 25 mg by mouth daily.    [provider]  tamsulosin (FLOMAX) 0.4 MG CAPS capsule Take 1 capsule (0.4 mg total) by mouth daily after supper. Patient taking differently: Take 0.4 mg by mouth every evening.  12/22/18   Lurline Del, DO  torsemide (DEMADEX) 20 MG tablet Take 2 tablets (40 mg total) by mouth 2 (two) times daily. Take additional 20 mg if S OB, edema or > 2 lbs weight gain 03/26/19   Wendee Beavers T, MD  trolamine salicylate (ASPERCREME) 10 % cream Apply 1 application topically every 6 (six) hours as needed for muscle pain.     [provider]  umeclidinium bromide (INCRUSE ELLIPTA) 62.5 MCG/INH AEPB Inhale 1 puff into the lungs daily. 11/27/18   Matilde Haymaker, MD    Physical Exam: Vitals:   04/29/19 1630 04/29/19 1728 04/29/19 1730 04/29/19 1800  BP: (!) 118/42 (!) 129/52 (!) 124/44 (!) 143/51  Pulse: (!) 34 61 (!) 57 (!) 58  Resp: (!) 24 (!) 23 (!) 25 16  Temp:      TempSrc:      SpO2: 100% 100% 100% 100%  Weight:      Height:        Constitutional: NAD, calm, comfortable,  communicating well, on 3 L of oxygen via nasal cannula. Eyes: PERRL, lids and conjunctivae normal ENMT: Mucous membranes are moist. Posterior pharynx clear of any exudate or lesions.Normal dentition.  Neck: normal, supple, no masses, no thyromegaly Respiratory: clear to auscultation bilaterally, no wheezing, no crackles. Normal respiratory effort. No accessory muscle use.  Cardiovascular: Regular rate and rhythm, no murmurs / rubs / gallops. No extremity edema. 2+ pedal pulses. No carotid bruits.  Abdomen: no tenderness, no masses palpated. No hepatosplenomegaly. Bowel sounds positive.  Musculoskeletal:      Neurologic: CN 2-12 grossly intact. Sensation intact, DTR normal. Strength 5/5 in all 4.  Psychiatric: Normal judgment and insight. Alert and oriented x 3. Normal mood.    Labs on Admission: I have personally reviewed following labs and imaging studies  CBC: Recent Labs  Lab 04/29/19 1409 04/29/19 1617  WBC 10.3  --   HGB 8.0* 7.8*  HCT 25.1* 23.0*  MCV 88.4  --   PLT 349  --    Basic Metabolic Panel: Recent Labs  Lab 04/29/19 1409 04/29/19 1617  NA 131* 129*  K 4.1 3.9  CL 87*  --   CO2 31  --   GLUCOSE 159*  --   BUN 27*  --   CREATININE 1.07  --   CALCIUM 8.9  --    GFR: Estimated Creatinine Clearance: 53.1 mL/min (by C-G formula based on SCr of 1.07 mg/dL). Liver Function Tests: No results for input(s): AST, ALT, ALKPHOS, BILITOT, PROT, ALBUMIN in the last 168 hours. No results for input(s): LIPASE, AMYLASE in the last 168 hours. No results for input(s): AMMONIA in the last 168 hours. Coagulation Profile: No results for input(s):  INR, PROTIME in the last 168 hours. Cardiac Enzymes: No results for input(s): CKTOTAL, CKMB, CKMBINDEX, TROPONINI in the last 168 hours. BNP (last 3 results) No results for input(s): PROBNP in the last 8760 hours. HbA1C: No results for input(s): HGBA1C in the last 72 hours. CBG: Recent Labs  Lab 04/29/19 1402  GLUCAP 141*    Lipid Profile: No results for input(s): CHOL, HDL, LDLCALC, TRIG, CHOLHDL, LDLDIRECT in the last 72 hours. Thyroid Function Tests: No results for input(s): TSH, T4TOTAL, FREET4, T3FREE, THYROIDAB in the last 72 hours. Anemia Panel: No results for input(s): VITAMINB12, FOLATE, FERRITIN, TIBC, IRON, RETICCTPCT in the last 72 hours. Urine analysis:    Component Value Date/Time   COLORURINE AMBER (A) 01/19/2019 1647   APPEARANCEUR CLOUDY (A) 01/19/2019 1647   LABSPEC 1.012 01/19/2019 1647   PHURINE 9.0 (H) 01/19/2019 1647   GLUCOSEU NEGATIVE 01/19/2019 1647   HGBUR MODERATE (A) 01/19/2019 1647   BILIRUBINUR NEGATIVE 01/19/2019 1647   KETONESUR NEGATIVE 01/19/2019 1647   PROTEINUR 100 (A) 01/19/2019 1647   UROBILINOGEN 0.2 12/23/2012 1026   NITRITE POSITIVE (A) 01/19/2019 1647   LEUKOCYTESUR MODERATE (A) 01/19/2019 1647    Radiological Exams on Admission: Dg Chest 2 View  Result Date: 04/29/2019 CLINICAL DATA:  Shortness of breath. EXAM: CHEST - 2 VIEW COMPARISON:  03/25/2019. FINDINGS: Prior CABG. Prior cardiac valve replacement. Cardiomegaly with pulmonary vascular prominence bilateral interstitial prominence and bilateral pleural effusions. Findings have progressed from prior exam. Findings are consistent with CHF. No pneumothorax. No acute bony abnormality. IMPRESSION: Prior CABG. Prior cardiac valve replacement. Cardiomegaly with diffuse bilateral from interstitial prominence and bilateral pleural effusions. Findings consistent with CHF. Findings have progressed from prior study of 03/25/2019. Electronically Signed   By: Marcello Moores  Register   On: 04/29/2019 14:50   Ct Head Wo Contrast  Result Date: 04/29/2019 CLINICAL DATA:  Altered mental status. EXAM: CT HEAD WITHOUT CONTRAST TECHNIQUE: Contiguous axial images were obtained from the base of the skull through the vertex without intravenous contrast. COMPARISON:  None. FINDINGS: Brain: No intracranial hemorrhage, mass effect, or  midline shift. Generalized atrophy, normal for age. Mild chronic small vessel ischemia. No hydrocephalus. The basilar cisterns are patent. No evidence of territorial infarct or acute ischemia. No extra-axial or intracranial fluid collection. Vascular: Atherosclerosis of skullbase vasculature without hyperdense vessel or abnormal calcification. Skull: No fracture or focal lesion. Sinuses/Orbits: Scattered mucosal thickening of ethmoid air cells. No sinus fluid level. Slight rightward nasal septal deviation. Bilateral cataract resection. The mastoid air cells are clear. Other: None. IMPRESSION: 1. No acute intracranial abnormality. 2. Age related atrophy and chronic small vessel ischemia. Electronically Signed   By: Keith Rake M.D.   On: 04/29/2019 17:27    EKG: A. fib with premature ventricular complexes.  Assessment/Plan Principal Problem:   AMS (altered mental status) Active Problems:   Anemia   Hypertension   Chronic anticoagulation   Atrial fibrillation (HCC)   S/P TAVR (transcatheter aortic valve replacement)   Chronic hypoxemic respiratory failure (HCC)   COPD (chronic obstructive pulmonary disease) (HCC)   Congestive heart failure (CHF) (HCC)   Type 2 diabetes mellitus (HCC)   Hyperlipidemia   Altered mental status: -Could be secondary to TIA versus hypoxia? -CT head without contrast came back negative for ischemic/hemorrhagic stroke. -Admit patient on the floor.  On telemetry. -Frequent neuro checks. MRI brain ordered-pending -Consulted PT/ST/OT, will keep him n.p.o. until he passes a bedside swallow evaluation. -EDP consulted neurology-await recommendations.     Acute on  chronic diastolic congestive heart failure: -Reviewed echo from 12/2018 which showed ejection fraction of 50 to 55%. -Chest x-ray suggestive of CHF.  BNP elevated at 276-chronic.  No leg swelling noted on exam. -Strict INO's and daily weight.  Start on Lasix 40 mg IV twice daily.  Digoxin level: WNL.  -Check magnesium level and monitor electrolytes closely. -EDP consulted cardiology-await recommendation.  Hypertension: We will continue his home meds -Monitor his blood pressure closely.  Diabetes mellitus: Check A1c -Hold home Metformin and started on sliding scale insulin  Hyponatremia: Likely secondary to diuretics. -Monitor for now.  COPD: Not in acute exacerbation. -Has chronic hypoxic respiratory failure-on 3 L of oxygen at home. -Currently on 3 L of oxygen, chest x-ray negative for pneumonia.  COVID-19 pending -On continuous pulse ox.  Hyperlipidemia: Continue statin  A. fib: Remains in A. fib with rate controlled.   -EKG noted. -On telemetry.  We will continue Eliquis, Lopressor and digoxin.  Coronary artery disease status post CABG: Stable -Initial troponin 40, trended down to 36, EKG no ST elevation or depression. -We will trend troponin.  Anemia: H&H is stable, no signs of bleeding, -Monitor H&H and transfuse as needed.  Necrotic appearing wound in both feet: -Please see pictures.  No signs of infection.  No bleeding/discharge seen.  Patient is afebrile with no leukocytosis.  He is followed by wound care outpatient. -No indication of antibiotics at this time. -Patient is scheduled with vascular surgery on December 7 for stent placement.  Unable to safely start patient's home medication at this time as pharmacy reconciliation pending.  DVT prophylaxis: TED/SCD/Eliquis Code Status: DNR-confirmed with the patient Family Communication: None present at bedside.  Plan of care discussed with patient and his wife Ms. Denice Paradise over the phone in length and they both verbalized understanding and agreed with it. Disposition Plan: TBD Consults called: Neurology and cardiology by EDP Admission status: Inpatient at medical telemetry bed   Mckinley Jewel MD [facility] Pager [phone]  If 7PM-7AM, please contact night-coverage [email or web address] Password [email or web address]   04/29/2019, 6:50 PM

## 2019-04-29 NOTE — ED Notes (Signed)
Patient transported to CT 

## 2019-04-29 NOTE — ED Provider Notes (Signed)
  Physical Exam  BP (!) 124/48   Pulse (!) 59   Temp 97.8 F (36.6 C) (Oral)   Resp (!) 23   Ht 5\' 11"  (1.803 m)   Wt 74.4 kg   SpO2 100%   BMI 22.87 kg/m   Physical Exam  ED Course/Procedures     Procedures  MDM  Patient care assumed from [facility], PA-C at shift change, please see her note for full HPI.  Briefly, patient with a past medical history of A. fib, CAD, CHF presented with altered level of consciousness, he had an episode of 8 to 10 minutes and where he was unresponsive, had bilateral hand shaking.  Brought in by wife who was a prior Therapist, sports.  She reports patient has had some changes in his Lasix that she has been given him more than the usual due to his weight gain.  Patient does wear oxygen 3 L at home, did not have any oxygen when this episode occurred.  Plan is to obtain CT head, labs, likely consulting cards on troponin.  He is still missing a delta Trop.  Cording to prior PA and prior attending patient is to be admitted for further evaluation and treatment, considering TIA work-up.  5:27 PM secondary troponin is decreased at 36.  Digoxin level is within normal limits.  5:45 PM spoke to Dr. Saralyn Pilar of neurology who stated night team will follow up on patient.  6:29 PM Spoke to Dr. Doristine Bosworth who will admit patient for TIA workup. I will also page cardiology in order to obtain further recommendations.  She currently does not have any chest pain, does have severe CAD.  Portions of this note were generated with [employer]obby[employer]. Dictation errors may occur despite best attempts at proofreading.         Janeece Fitting, PA-C 04/29/19 1830    Pattricia Boss, MD 04/30/19 [phone]

## 2019-04-29 NOTE — ED Notes (Signed)
PAGED V. PATEL TO RN ALANA--[name]

## 2019-04-29 NOTE — ED Provider Notes (Signed)
[facility] EMERGENCY DEPARTMENT Provider Note   CSN: [id number] Arrival date & time: 04/29/19  1354     History   Chief Complaint Chief Complaint  Patient presents with  . Shortness of Breath    HPI BANYAN GOODCHILD is a 85 y.o. male.     Patient is an 85 year old gentleman with past medical history of atrial fibrillation, coronary artery disease, CHF, COPD on chronic 3 L of oxygen presenting to the emergency department with his wife for altered level of consciousness.  Wife states that over the last couple days the patient has Been complaining that he is not been feeling well and he has not been up and walking as much as usual.  Reports that today she began walking patient from the front steps to their vehicle when she noticed the change.  Reports that he was not wearing his oxygen from the steps to the car because it is hard for him to we will his oxygen over the gravel.  Reports that he looked like he was getting weaker and by the time he sat down in the car he was unresponsive.  Reports that his eyes were open, he had pinpoint pupils, he was drooling and he would not respond to her.  Reports that he looked like he was not breathing and his lips turned blue.  Reports that she was on the phone with EMS for about 10 minutes and this lasted the duration of that time.  Reports that she was able to get him to smile and he had facial symmetry.  Denies any one-sided weakness, tingling, numbness.  Reports that currently he is not his baseline.  She reports that over the last 3 days she had been increasing his diuretic pill because he had gained 5 pounds in 3 days.  Reports that she has been able to get some of that weight off but she feels that he has been more short of breath.  Although, he has been using the same amount of oxygen at home as his usual which is 3 L.  The patient currently denies any symptoms including chest pain, fever, cough, shortness of breath, nausea, vomiting.      Past Medical History:  Diagnosis Date  . Acute myocardial infarction, unspecified site, initial episode of care   . Anemia   . Anxiety   . Aortic stenosis    . Atrial fibrillation 06/27/2013  . Atrial fibrillation ([place])    a. permanent, on Coumadin for anticoagulation  . BPPV (benign paroxysmal positional vertigo) 2015  . CAD (coronary artery disease)    a. s/p CABG in 1994 b. cath in 2015 showing normal LM, 100% LCx and RCA stenosis with 50-60% prox LAD stenosis and 100% mid-LAD stenosis; patent sequential SVG-OM2-OM3 and patent LIMA-LAD with PTCA of distal LAD via LIMA graft performed at that time  . Carotid stenosis   . CAROTID STENOSIS- moderate 09/09/2008   Qualifier: Diagnosis of  By: Burnett Kanaris    . Cellulitis 11/15/2018  . Chest pain with moderate risk of acute coronary syndrome 06/27/2013  . CHF (congestive heart failure) ([place])   . Chronic anticoagulation 06/27/2013  . COPD (chronic obstructive pulmonary disease) ([place])   . Depression   . Diabetes mellitus without complication ([place])    FASTING 98-120S  . GERD (gastroesophageal reflux disease)   . Gout attack- on steroid dose pack 06/27/2013  . History of blood transfusion   . History of peptic ulcer disease   . Hyperlipidemia   .  Hypertension   . Left atrial severely dilated 01/19/2019  . Myocardial infarction ([place])    X2  . NSTEMI (non-ST elevated myocardial infarction) ([place]) 08/2013  . 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 PEPTIC ULCER DISEASE, HX OF 09/09/2008   Qualifier: Diagnosis of  By: Burnett Kanaris    . PERIPHERAL VASCULAR DISEASE 09/09/2008   Qualifier: Diagnosis of  By: Burnett Kanaris    . Peripheral vascular disease ([place])   . Pneumonia    HX OF  . Presence of indwelling Foley catheter 01/20/2019  . Pressure injury of skin 12/17/2018  . Renal artery stenosis ([place])   . S/P thoracentesis   . Severe aortic stenosis 09/17/2017  .  Sleep apnea    OXYGEN AT NIGHT 2L [place]  . Stress fracture of calcaneus 2018   Dr Paulla Dolly [facility])  . Tachycardia- beta blocker increased 06/26/2013  . UNSPECIFIED ANEMIA 09/09/2008   Qualifier: Diagnosis of  By: Burnett Kanaris    . UNSPECIFIED CEREBROVASCULAR DISEASE 09/09/2008   Qualifier: Diagnosis of  By: Burnett Kanaris    . Unstable angina ([place]) 08/31/2013  . Vertigo 06/27/2013    Patient Active Problem List   Diagnosis Date Noted  . Acute on chronic diastolic (congestive) heart failure ([place]) 03/23/2019  . Type 2 diabetes mellitus ([place]) 03/23/2019  . Generalized weakness   . Congestive heart failure (CHF) ([place]) 02/17/2019  . Hypoxemia   . Acute pulmonary edema ([facility])   . Heart failure with preserved ejection fraction ([place]) 01/20/2019  . Acute on chronic diastolic heart failure ([place]) 01/20/2019  . Pyuria 01/20/2019  . Hematuria, microscopic 01/20/2019  . Presence of indwelling Foley catheter 01/20/2019  . Goals of care, counseling/discussion   . Palliative care encounter   . Severe Pulmonary hypertension ([place]) 01/19/2019  . Chronic hypoxemic respiratory failure ([place]) 01/19/2019  . COPD (chronic obstructive pulmonary disease) ([place]) 01/19/2019  . Hypoalbuminemia 01/19/2019  . Bilateral pleural effusion 01/19/2019  . Left atrial severely dilated 01/19/2019  . Dyspnea 12/31/2018  . Pulmonary embolism and infarction ([place])   . Acute on chronic respiratory failure with hypoxia ([place])   . 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 Mitral stenosis   . Atrial fibrillation ([place])   . Chronic anticoagulation 06/27/2013  . 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 Hypertension associated with diabetes ([place]) 03/10/2009  . Hyperlipidemia associated with type 2 diabetes mellitus ([place]) 09/09/2008  . Anemia of chronic disease 09/09/2008  . RENAL ARTERY STENOSIS- moderate 09/09/2008    Past Surgical History:   Procedure Laterality Date  . AORTIC ARCH ANGIOGRAPHY N/A 08/14/2017   Procedure: AORTIC ARCH ANGIOGRAPHY;  Surgeon: Sherren Mocha, MD;  Location: [facility];  Service: Cardiovascular;  Laterality: N/A;  . BOWEL RESECTION  07/2013   Bowel perforation  . CARDIAC CATHETERIZATION    . CAROTID ENDARTERECTOMY Right 2014   Curt Jews, MD  . COLON SURGERY    . CORONARY ARTERY BYPASS GRAFT  1994  . ENDARTERECTOMY Right 01/05/2013   Procedure: ENDARTERECTOMY CAROTID;  Surgeon: Rosetta Posner, MD;  Location: [facility];  Service: Vascular;  Laterality: Right;  . INCISION AND DRAINAGE Left 11/16/2018   Procedure: INCISION AND DRAINAGE LEFT FOREARM/ARM;  Surgeon: Leanora Cover, MD;  Location: [facility];  Service: Orthopedics;  Laterality: Left;  . KNEE SURGERY  08/2003   left knee/ARTHROSCOPIC  . LEFT HEART CATHETERIZATION WITH CORONARY/GRAFT ANGIOGRAM N/A  09/01/2013   Procedure: LEFT HEART CATHETERIZATION WITH Beatrix Fetters;  Surgeon: Sinclair Grooms, MD;  Location: [facility] CATH LAB;  Service: Cardiovascular;  Laterality: N/A;  . PATCH ANGIOPLASTY Right 01/05/2013   Procedure: PATCH ANGIOPLASTY;  Surgeon: Rosetta Posner, MD;  Location: [facility];  Service: Vascular;  Laterality: Right;  . PERCUTANEOUS CORONARY STENT INTERVENTION (PCI-S)  09/01/2013   Procedure: PERCUTANEOUS CORONARY STENT INTERVENTION (PCI-S);  Surgeon: Sinclair Grooms, MD;  Location: [facility] CATH LAB;  Service: Cardiovascular;;  . removal of bleeding ulcer  1994  . RENAL ARTERY STENT     stenting of the left renal artery as well as a cutting balloon angioplasty for treatment of in-stent restenosis coronary artery bypass grafting in 1994.  [name] RIGHT/LEFT HEART CATH AND CORONARY/GRAFT ANGIOGRAPHY N/A 08/14/2017   Procedure: RIGHT/LEFT HEART CATH AND CORONARY/GRAFT ANGIOGRAPHY;  Surgeon: Sherren Mocha, MD;  Location: [facility] LAB;  Service: Cardiovascular;  Laterality: N/A;  . TEE WITHOUT CARDIOVERSION N/A 09/17/2017   Procedure:  TRANSESOPHAGEAL ECHOCARDIOGRAM (TEE);  Surgeon: Sherren Mocha, MD;  Location: [facility];  Service: Open Heart Surgery;  Laterality: N/A;        Home Medications    Prior to Admission medications   Medication Sig Start Date End Date Taking? Authorizing Provider  acyclovir (ZOVIRAX) 800 MG tablet Take 400 mg by mouth 2 (two) times daily.    [provider]  allopurinol (ZYLOPRIM) 300 MG tablet Take 300 mg by mouth daily.    [provider]  apixaban (ELIQUIS) 5 MG TABS tablet Take 1 tablet (5 mg total) by mouth 2 (two) times daily. 12/22/18 04/09/19  Lurline Del, DO  atorvastatin (LIPITOR) 80 MG tablet Take 1 tablet (80 mg total) by mouth at bedtime. 12/07/14   Lelon Perla, MD  brimonidine (ALPHAGAN) 0.2 % ophthalmic solution Place 1 drop into both eyes 2 (two) times daily.    [provider]  budesonide-formoterol (SYMBICORT) 80-4.5 MCG/ACT inhaler Inhale 1 puff into the lungs daily as needed ("for shortness of breath or wheezing- rinse mouth afterwards").  09/05/18   [provider]  Cholecalciferol (VITAMIN D) 50 MCG (2000 UT) tablet Take 1 tablet by mouth daily. 02/13/19   [provider]  digoxin (LANOXIN) 0.125 MG tablet Take 1 tablet (0.125 mg total) by mouth daily. 01/29/19   Shirley, [place], DO  ferrous sulfate 325 (65 FE) MG tablet Take 1 tablet (325 mg total) by mouth daily with breakfast. 02/22/19   Domenic Polite, MD  insulin aspart (NOVOLOG FLEXPEN) 100 UNIT/ML FlexPen Inject 2 Units into the skin See admin instructions. Inject 2 units into the skin at 9 AM in the morning as needed for a BGL >200    [provider]  lansoprazole (PREVACID) 30 MG capsule Take 30 mg by mouth daily at 12 noon.    [provider]  levalbuterol Penne Lash) 0.63 MG/3ML nebulizer solution Take 3 mLs (0.63 mg total) by nebulization every 6 (six) hours as needed for wheezing or shortness of breath. Patient taking differently: Take 0.63 mg by  nebulization 3 (three) times daily.  12/22/18   Lurline Del, DO  Melatonin 5 MG TABS Take 5 mg by mouth at bedtime.    [provider]  metFORMIN (GLUCOPHAGE-XR) 500 MG 24 hr tablet Take 500 mg by mouth at bedtime.  12/10/18   [provider]  metoprolol tartrate (LOPRESSOR) 25 MG tablet Take 1 tablet (25 mg total) by mouth 2 (two) times daily. 02/22/19  Domenic Polite, MD  Multiple Vitamin (MULTIVITAMIN WITH MINERALS) TABS Take 1 tablet by mouth daily.    [provider]  NON FORMULARY Take 120 mLs by mouth See admin instructions. MedPass: Drink 120 ml's by mouth two times a day    [provider]  OXYGEN Inhale 3 L/min into the lungs continuous. TO MAINTAIN SATS <90%    [provider]  polyethylene glycol (MIRALAX / GLYCOLAX) 17 g packet Take 17 g by mouth 2 (two) times daily. 12/22/18   Lurline Del, DO  potassium chloride (KLOR-CON M15) 15 MEQ tablet Take 2 tablets (30 mEq total) by mouth 2 (two) times daily. 01/28/19   Shirley, [place], DO  spironolactone (ALDACTONE) 25 MG tablet Take 25 mg by mouth daily.    [provider]  tamsulosin (FLOMAX) 0.4 MG CAPS capsule Take 1 capsule (0.4 mg total) by mouth daily after supper. Patient taking differently: Take 0.4 mg by mouth every evening.  12/22/18   Lurline Del, DO  torsemide (DEMADEX) 20 MG tablet Take 2 tablets (40 mg total) by mouth 2 (two) times daily. Take additional 20 mg if S OB, edema or > 2 lbs weight gain 03/26/19   Wendee Beavers T, MD  trolamine salicylate (ASPERCREME) 10 % cream Apply 1 application topically every 6 (six) hours as needed for muscle pain.     [provider]  umeclidinium bromide (INCRUSE ELLIPTA) 62.5 MCG/INH AEPB Inhale 1 puff into the lungs daily. 11/27/18   Matilde Haymaker, MD    Family History Family History  Problem Relation Age of Onset  . Coronary artery disease Mother   . Heart disease Mother        Before age 58  . Hyperlipidemia Mother   .  Hypertension Mother   . Heart attack Mother   . Coronary artery disease Father   . Heart disease Father        After age 90  . Heart attack Father   . Heart disease Sister        After age 43  . Heart disease Brother        After age 56  . Coronary artery disease Other        13 sibling, almost all have coronary disease and some with premature onset  . Heart disease Other        13 sibling, almost all have coronary disease and some with premature onset    Social History Social History   Tobacco Use  . Smoking status: Former Smoker    Quit date: 04/27/1993    Years since quitting: 26.0  . Smokeless tobacco: Never Used  Substance Use Topics  . Alcohol use: No    Alcohol/week: 0.0 standard drinks  . Drug use: No     Allergies   Bactrim [sulfamethoxazole-trimethoprim] and Levaquin [levofloxacin in d5w]   Review of Systems Review of Systems  Constitutional: Positive for appetite change and fatigue. Negative for chills and fever.  HENT: Negative for congestion.   Eyes: Negative for visual disturbance.  Respiratory: Positive for shortness of breath. Negative for cough.   Cardiovascular: Negative for chest pain, palpitations and leg swelling.  Gastrointestinal: Negative for abdominal pain, nausea and vomiting.  Genitourinary: Negative for dysuria.  Musculoskeletal: Negative for back pain.  Skin: Negative for rash.  Neurological: Negative for dizziness, weakness, light-headedness and headaches.     Physical Exam Updated Vital Signs Ht 5\' 11"  (1.803 m)   Wt 74.4 kg   SpO2 100%  BMI 22.87 kg/m   Physical Exam Vitals signs and nursing note reviewed.  Constitutional:      Appearance: Normal appearance.     Comments: Pleasant, elderly male  HENT:     Head: Normocephalic.     Mouth/Throat:     Mouth: Mucous membranes are moist.  Eyes:     Conjunctiva/sclera: Conjunctivae normal.     Pupils: Pupils are equal, round, and reactive to light.  Cardiovascular:      Rate and Rhythm: Normal rate. Rhythm irregular.     Heart sounds: Murmur present.  Pulmonary:     Effort: Pulmonary effort is normal.     Breath sounds: Examination of the right-lower field reveals rales. Examination of the left-lower field reveals rales. Rales present.  Musculoskeletal:     Right lower leg: No edema.     Left lower leg: No edema.     Comments: Chronic arterial insufficiency.  The left great toe has a distal portion which is black and dry.  The wife states that this is chronic and unchanged and she is seeing vascular surgery on December 7  Skin:    General: Skin is dry.  Neurological:     General: No focal deficit present.     Mental Status: He is alert and oriented to person, place, and time.     Cranial Nerves: No cranial nerve deficit.  Psychiatric:        Mood and Affect: Mood normal.        Behavior: Behavior normal.      ED Treatments / Results  Labs (all labs ordered are listed, but only abnormal results are displayed) Labs Reviewed  CBC - Abnormal; Notable for the following components:      Result Value   RBC 2.84 (*)    Hemoglobin 8.0 (*)    HCT 25.1 (*)    RDW 17.3 (*)    All other components within normal limits  CBG MONITORING, ED - Abnormal; Notable for the following components:   Glucose-Capillary 141 (*)    All other components within normal limits  BASIC METABOLIC PANEL  BRAIN NATRIURETIC PEPTIDE  DIGOXIN LEVEL  TROPONIN I (HIGH SENSITIVITY)    EKG EKG Interpretation  Date/Time:  Wednesday April 29 2019 14:03:07 EST Ventricular Rate:  53 PR Interval:    QRS Duration: 173 QT Interval:  433 QTC Calculation: 407 R Axis:   -32 Text Interpretation: Atrial fibrillation Ventricular premature complex Left bundle branch block Confirmed by Pattricia Boss [phone]) on 04/29/2019 2:15:20 PM   Radiology No results found.  Procedures Procedures (including critical care time)  Medications Ordered in ED Medications - No data to display    Initial Impression / Assessment and Plan / ED Course  I have reviewed the triage vital signs and the nursing notes.  Pertinent labs & imaging results that were available during my care of the patient were reviewed by me and considered in my medical decision making (see chart for details).        Care passed to [facility] due to change of shift. Patient presenting with SOB and 10 minutes of AMS today. Currently at his baseline per wife's report. Labs significant for elevated trop. Patient has chronically elevated trop. Currently on his normal 2l oxygen. Waiting on head CT and remainder of labs for dispo  Final Clinical Impressions(s) / ED Diagnoses   Final diagnoses:  None    ED Discharge Orders    None  Alveria Apley, PA-C 04/29/19 1537    Pattricia Boss, MD 04/29/19 [phone]

## 2019-04-29 NOTE — Consult Note (Signed)
Requesting Physician: Gust Brooms PA- C    Chief Complaint: Episode of unresponsiveness  History obtained from: Patient and Chart   HPI:                                                                                                                                       [name] is a 85 y.o. male with past medical history significant for congestive heart failure on home oxygen, atrial fibrillation on Eliquis, coronary artery disease, aortic stenosis, COPD, peripheral vascular disease, diabetes, hypertension, hyperlipidemia, renal artery stenosis presents to the emergency department after wife brought him for episode of unresponsiveness lasting for about 10 minutes.  History obtained by the patient, states that he remembers the episode.  He tells me that he was walking down the stairs without his oxygen as his wife was carrying the oxygen tank to the car.  By the time he got to the car, he felt significantly short of breath and could not speak.  He states his symptoms improved after he was able to get oxygen.  Work-up in the emergency room so far is notable for chest x-ray showing fluid overload, patient also noted to be bradycardic and tachypneic as well as elevated troponin.  CT head was obtained and was unremarkable for acute findings.   Past Medical History:  Diagnosis Date  . Acute myocardial infarction, unspecified site, initial episode of care   . Anemia   . Anxiety   . Aortic stenosis    . Atrial fibrillation 06/27/2013  . Atrial fibrillation ([place])    a. permanent, on Coumadin for anticoagulation  . BPPV (benign paroxysmal positional vertigo) 2015  . CAD (coronary artery disease)    a. s/p CABG in 1994 b. cath in 2015 showing normal LM, 100% LCx and RCA stenosis with 50-60% prox LAD stenosis and 100% mid-LAD stenosis; patent sequential SVG-OM2-OM3 and patent LIMA-LAD with PTCA of distal LAD via LIMA graft performed at that time  . Carotid stenosis   . CAROTID STENOSIS-  moderate 09/09/2008   Qualifier: Diagnosis of  By: Burnett Kanaris    . Cellulitis 11/15/2018  . Chest pain with moderate risk of acute coronary syndrome 06/27/2013  . CHF (congestive heart failure) ([place])   . Chronic anticoagulation 06/27/2013  . COPD (chronic obstructive pulmonary disease) ([place])   . Depression   . Diabetes mellitus without complication ([place])    FASTING 98-120S  . GERD (gastroesophageal reflux disease)   . Gout attack- on steroid dose pack 06/27/2013  . History of blood transfusion   . History of peptic ulcer disease   . Hyperlipidemia   . Hypertension   . Left atrial severely dilated 01/19/2019  . Myocardial infarction ([place])    X2  . NSTEMI (non-ST elevated myocardial infarction) ([place]) 08/2013  . 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 PEPTIC ULCER DISEASE, HX  OF 09/09/2008   Qualifier: Diagnosis of  By: Burnett Kanaris    . PERIPHERAL VASCULAR DISEASE 09/09/2008   Qualifier: Diagnosis of  By: Burnett Kanaris    . Peripheral vascular disease ([place])   . Pneumonia    HX OF  . Presence of indwelling Foley catheter 01/20/2019  . Pressure injury of skin 12/17/2018  . Renal artery stenosis ([place])   . S/P thoracentesis   . Severe aortic stenosis 09/17/2017  . Sleep apnea    OXYGEN AT NIGHT 2L [place]  . Stress fracture of calcaneus 2018   Dr Paulla Dolly [facility])  . Tachycardia- beta blocker increased 06/26/2013  . UNSPECIFIED ANEMIA 09/09/2008   Qualifier: Diagnosis of  By: Burnett Kanaris    . UNSPECIFIED CEREBROVASCULAR DISEASE 09/09/2008   Qualifier: Diagnosis of  By: Burnett Kanaris    . Unstable angina ([place]) 08/31/2013  . Vertigo 06/27/2013    Past Surgical History:  Procedure Laterality Date  . AORTIC ARCH ANGIOGRAPHY N/A 08/14/2017   Procedure: AORTIC ARCH ANGIOGRAPHY;  Surgeon: Sherren Mocha, MD;  Location: [place] [facility];  Service: Cardiovascular;  Laterality: N/A;  . BOWEL RESECTION  07/2013   Bowel  perforation  . CARDIAC CATHETERIZATION    . CAROTID ENDARTERECTOMY Right 2014   Curt Jews, MD  . COLON SURGERY    . CORONARY ARTERY BYPASS GRAFT  1994  . ENDARTERECTOMY Right 01/05/2013   Procedure: ENDARTERECTOMY CAROTID;  Surgeon: Rosetta Posner, MD;  Location: [place];  Service: Vascular;  Laterality: Right;  . INCISION AND DRAINAGE Left 11/16/2018   Procedure: INCISION AND DRAINAGE LEFT FOREARM/ARM;  Surgeon: Leanora Cover, MD;  Location: [place];  Service: Orthopedics;  Laterality: Left;  . KNEE SURGERY  08/2003   left knee/ARTHROSCOPIC  . LEFT HEART CATHETERIZATION WITH CORONARY/GRAFT ANGIOGRAM N/A 09/01/2013   Procedure: LEFT HEART CATHETERIZATION WITH Beatrix Fetters;  Surgeon: Sinclair Grooms, MD;  Location: [facility];  Service: Cardiovascular;  Laterality: N/A;  . PATCH ANGIOPLASTY Right 01/05/2013   Procedure: PATCH ANGIOPLASTY;  Surgeon: Rosetta Posner, MD;  Location: [place];  Service: Vascular;  Laterality: Right;  . PERCUTANEOUS CORONARY STENT INTERVENTION (PCI-S)  09/01/2013   Procedure: PERCUTANEOUS CORONARY STENT INTERVENTION (PCI-S);  Surgeon: Sinclair Grooms, MD;  Location: [facility] CATH LAB;  Service: Cardiovascular;;  . removal of bleeding ulcer  1994  . RENAL ARTERY STENT     stenting of the left renal artery as well as a cutting balloon angioplasty for treatment of in-stent restenosis coronary artery bypass grafting in 1994.  [name] RIGHT/LEFT HEART CATH AND CORONARY/GRAFT ANGIOGRAPHY N/A 08/14/2017   Procedure: RIGHT/LEFT HEART CATH AND CORONARY/GRAFT ANGIOGRAPHY;  Surgeon: Sherren Mocha, MD;  Location: [place] [facility];  Service: Cardiovascular;  Laterality: N/A;  . TEE WITHOUT CARDIOVERSION N/A 09/17/2017   Procedure: TRANSESOPHAGEAL ECHOCARDIOGRAM (TEE);  Surgeon: Sherren Mocha, MD;  Location: [place];  Service: Open Heart Surgery;  Laterality: N/A;    Family History  Problem Relation Age of Onset  . Coronary artery disease Mother   . Heart disease Mother        Before  age 23  . Hyperlipidemia Mother   . Hypertension Mother   . Heart attack Mother   . Coronary artery disease Father   . Heart disease Father        After age 55  . Heart attack Father   . Heart disease Sister        After age 96  . Heart disease  Brother        After age 10  . Coronary artery disease Other        13 sibling, almost all have coronary disease and some with premature onset  . Heart disease Other        13 sibling, almost all have coronary disease and some with premature onset   Social History:  reports that he quit smoking about 26 years ago. He has never used smokeless tobacco. He reports that he does not drink alcohol or use drugs.  Allergies:  Allergies  Allergen Reactions  . Bactrim [Sulfamethoxazole-Trimethoprim] Other (See Comments)    "Allergic," per MAR  . Levaquin [Levofloxacin In D5w] Diarrhea and Other (See Comments)    "Allergic," per MAR    Medications:                                                                                                                        I reviewed home medications   ROS:                                                                                                                                     14 systems reviewed and negative except above    Examination:                                                                                                      General: Appears well-developed and well-nourished.  Psych: Affect appropriate to situation Eyes: No scleral injection HENT: No OP obstrucion Head: Normocephalic.  Cardiovascular: Normal rate and regular rhythm.  Respiratory: Effort normal and breath sounds normal to anterior ascultation GI: Soft.  No distension. There is no tenderness.  Skin/ext: Bilateral foot ulcers    Neurological Examination Mental Status: Alert, oriented, thought content appropriate.  Speech fluent without evidence of aphasia. Able to follow 3 step commands without  difficulty. Cranial Nerves: II: Visual fields grossly normal,  III,IV, VI: ptosis not present, extra-ocular motions intact bilaterally,  pupils equal, round, reactive to light and accommodation V,VII: smile symmetric, facial light touch sensation normal bilaterally VIII: hearing normal bilaterally IX,X: uvula rises symmetrically XI: bilateral shoulder shrug XII: midline tongue extension Motor: Right : Upper extremity   5/5    Left:     Upper extremity   5/5  Lower extremity   5/5     Lower extremity   5/5 Tone and bulk:normal tone throughout; no atrophy noted Sensory: Pinprick and light touch intact throughout, bilaterally Deep Tendon Reflexes: 2+ and symmetric throughout Plantars: Right: downgoing   Left: downgoing Cerebellar: normal finger-to-nose, normal rapid alternating movements and normal heel-to-shin test Gait: not assessed     Lab Results: Basic Metabolic Panel: Recent Labs  Lab 04/29/19 1409 04/29/19 1617 04/29/19 1858  NA 131* 129*  --   K 4.1 3.9  --   CL 87*  --   --   CO2 31  --   --   GLUCOSE 159*  --   --   BUN 27*  --   --   CREATININE 1.07  --   --   CALCIUM 8.9  --   --   MG  --   --  2.2  PHOS  --   --  3.6    CBC: Recent Labs  Lab 04/29/19 1409 04/29/19 1617  WBC 10.3  --   HGB 8.0* 7.8*  HCT 25.1* 23.0*  MCV 88.4  --   PLT 349  --     Coagulation Studies: No results for input(s): LABPROT, INR in the last 72 hours.  Imaging: Dg Chest 2 View  Result Date: 04/29/2019 CLINICAL DATA:  Shortness of breath. EXAM: CHEST - 2 VIEW COMPARISON:  03/25/2019. FINDINGS: Prior CABG. Prior cardiac valve replacement. Cardiomegaly with pulmonary vascular prominence bilateral interstitial prominence and bilateral pleural effusions. Findings have progressed from prior exam. Findings are consistent with CHF. No pneumothorax. No acute bony abnormality. IMPRESSION: Prior CABG. Prior cardiac valve replacement. Cardiomegaly with diffuse bilateral from  interstitial prominence and bilateral pleural effusions. Findings consistent with CHF. Findings have progressed from prior study of 03/25/2019. Electronically Signed   By: Marcello Moores  Register   On: 04/29/2019 14:50   Ct Head Wo Contrast  Result Date: 04/29/2019 CLINICAL DATA:  Altered mental status. EXAM: CT HEAD WITHOUT CONTRAST TECHNIQUE: Contiguous axial images were obtained from the base of the skull through the vertex without intravenous contrast. COMPARISON:  None. FINDINGS: Brain: No intracranial hemorrhage, mass effect, or midline shift. Generalized atrophy, normal for age. Mild chronic small vessel ischemia. No hydrocephalus. The basilar cisterns are patent. No evidence of territorial infarct or acute ischemia. No extra-axial or intracranial fluid collection. Vascular: Atherosclerosis of skullbase vasculature without hyperdense vessel or abnormal calcification. Skull: No fracture or focal lesion. Sinuses/Orbits: Scattered mucosal thickening of ethmoid air cells. No sinus fluid level. Slight rightward nasal septal deviation. Bilateral cataract resection. The mastoid air cells are clear. Other: None. IMPRESSION: 1. No acute intracranial abnormality. 2. Age related atrophy and chronic small vessel ischemia. Electronically Signed   By: Keith Rake M.D.   On: 04/29/2019 17:27     I have reviewed the above imaging : Head CT reviewed, no acute findings.   ASSESSMENT AND PLAN  85 y.o. male with past medical history significant for congestive heart failure on home oxygen, atrial fibrillation on Eliquis, coronary artery disease, aortic stenosis, COPD, peripheral vascular disease, diabetes, hypertension, hyperlipidemia, renal artery stenosis presents to the emergency department after wife brought him for  episode of unresponsiveness lasting for about 10 minutes in the setting of hypoxia.    Acute encephalopathy likely in the setting of hypoxia, unlikely transient ischemic  attack   Recommendations CT head reviewed: No acute findings.  Obtain carotid Doppler to evaluate for severe carotid stenosis MRI Brain cancelled as some concern for obtaining MRI brain due to hyper density noted on CT in the left globe and would unlikely change treatment as patient already on Eliquis for stroke prevention due to A. Fib. Continue Eliquis    Rye Dorado [facility] Pager Number [phone]

## 2019-04-29 NOTE — Progress Notes (Signed)
Cross cover MD note: Called by radiology, spoke with Dr. Armandina Gemma regarding this patient's MRI brain. The CT scan per radiologist demonstrated a hyperdensity in the left globe. Statistically speaking this is most likely a calcification, though cannot r/o metal foreign body. The patient has no MRI documented in our system and is ex-[employer], raising the possible risk of magnetic field exposure if this were a metal foreign body. I've noted that the CT was otherwise negative for acute intracranial findings and indication for MRI is nonemergent (AMS). Neurology has been consulted, so we will not perform MRI at this time and I will defer to their recommendations regarding pursuing an MR study on this patient.   Vance Gather, MD 04/29/2019 8:25 PM

## 2019-04-30 DIAGNOSIS — J411 Mucopurulent chronic bronchitis: Secondary | ICD-10-CM

## 2019-04-30 DIAGNOSIS — I5033 Acute on chronic diastolic (congestive) heart failure: Secondary | ICD-10-CM

## 2019-04-30 LAB — BASIC METABOLIC PANEL
Anion gap: 10 (ref 5–15)
BUN: 23 mg/dL (ref 8–23)
CO2: 32 mmol/L (ref 22–32)
Calcium: 9 mg/dL (ref 8.9–10.3)
Chloride: 91 mmol/L — ABNORMAL LOW (ref 98–111)
Creatinine, Ser: 1.05 mg/dL (ref 0.61–1.24)
GFR calc Af Amer: >60 mL/min (ref >60)
GFR calc non Af Amer: >60 mL/min (ref >60)
Glucose, Bld: 99 mg/dL (ref 70–99)
Potassium: 3.6 mmol/L (ref 3.5–5.1)
Sodium: 133 mmol/L — ABNORMAL LOW (ref 135–145)

## 2019-04-30 LAB — CBC
HCT: 25.4 % — ABNORMAL LOW (ref 39.0–52.0)
Hemoglobin: 8 g/dL — ABNORMAL LOW (ref 13.0–17.0)
MCH: 27.9 pg (ref 26.0–34.0)
MCHC: 31.5 g/dL (ref 30.0–36.0)
MCV: 88.5 fL (ref 80.0–100.0)
Platelets: 349 10*3/uL (ref 150–400)
RBC: 2.87 MIL/uL — ABNORMAL LOW (ref 4.22–5.81)
RDW: 17.3 % — ABNORMAL HIGH (ref 11.5–15.5)
WBC: 8.5 10*3/uL (ref 4.0–10.5)
nRBC: 0 % (ref 0.0–0.2)

## 2019-04-30 LAB — GLUCOSE, CAPILLARY
Glucose-Capillary: 179 mg/dL — ABNORMAL HIGH (ref 70–99)
Glucose-Capillary: 103 mg/dL — ABNORMAL HIGH (ref 70–99)

## 2019-04-30 LAB — CBG MONITORING, ED
Glucose-Capillary: 87 mg/dL (ref 70–99)
Glucose-Capillary: 88 mg/dL (ref 70–99)
Glucose-Capillary: 101 mg/dL — ABNORMAL HIGH (ref 70–99)

## 2019-04-30 MED ORDER — FUROSEMIDE 10 MG/ML IJ SOLN
60.0000 mg | Freq: Two times a day (BID) | INTRAMUSCULAR | Status: AC
  Administered 2019-04-30: 22:00:00 60 mg via INTRAVENOUS
  Filled 2019-04-30: qty 6

## 2019-04-30 MED ORDER — POLYETHYLENE GLYCOL 3350 17 G PO PACK
17.0000 g | PACK | Freq: Every day | ORAL | Status: DC
  Filled 2019-04-30 (×2): qty 1

## 2019-04-30 MED ORDER — APIXABAN 5 MG PO TABS
5.0000 mg | ORAL_TABLET | Freq: Two times a day (BID) | ORAL | Status: DC
  Administered 2019-04-30 – 2019-05-01 (×3): 5 mg via ORAL
  Filled 2019-04-30 (×4): qty 1

## 2019-04-30 MED ORDER — SPIRONOLACTONE 25 MG PO TABS
25.0000 mg | ORAL_TABLET | Freq: Every day | ORAL | Status: DC
  Administered 2019-04-30 – 2019-05-01 (×2): 25 mg via ORAL
  Filled 2019-04-30 (×2): qty 1

## 2019-04-30 MED ORDER — METOPROLOL TARTRATE 25 MG PO TABS
25.0000 mg | ORAL_TABLET | Freq: Two times a day (BID) | ORAL | Status: DC
  Administered 2019-04-30 – 2019-05-01 (×3): 25 mg via ORAL
  Filled 2019-04-30 (×3): qty 1

## 2019-04-30 MED ORDER — DIGOXIN 125 MCG PO TABS
0.1250 mg | ORAL_TABLET | Freq: Every day | ORAL | Status: DC
  Administered 2019-04-30 – 2019-05-01 (×3): 0.125 mg via ORAL
  Filled 2019-04-30 (×2): qty 1

## 2019-04-30 MED ORDER — ATORVASTATIN CALCIUM 80 MG PO TABS
80.0000 mg | ORAL_TABLET | Freq: Every day | ORAL | Status: DC
  Administered 2019-04-30: 80 mg via ORAL
  Filled 2019-04-30: qty 1

## 2019-04-30 MED ORDER — CHLORHEXIDINE GLUCONATE CLOTH 2 % EX PADS
6.0000 | MEDICATED_PAD | Freq: Every day | CUTANEOUS | Status: DC
  Administered 2019-04-30 – 2019-05-01 (×2): 6 via TOPICAL

## 2019-04-30 NOTE — ED Notes (Signed)
Heart healthy/carb modified lunch tray ordered 

## 2019-04-30 NOTE — Consult Note (Addendum)
Cardiology Consultation:   Patient ID: ECTOR LAUREL MRN: [id number]; DOB: Apr 08, 1932  Admit date: 04/29/2019 Date of Consult: 04/30/2019  Primary Care Provider: Nicoletta Dress, MD Primary Cardiologist: Kirk Ruths, MD  Primary Electrophysiologist:  None    Patient Profile:   GAVON MAJANO is a 85 y.o. male with a hx of CAD s/p CABG (patent LIMA-LAD, SVG-OM1-OM2 on heart cath 08/2017), AS s/p TAVR 09/2017, moderate to severe mitral stenosis, permanent Afib rate controlled with lopressor and on eliquis, right carotid endarterectomy 8469, chronic diastolic heart failure, indwelling foley catheter, chronic anemia, COPD on home O2, and DM who is being seen today for the evaluation of dCHF exacerbation at the request of Dr. Broadus John.  History of Present Illness:   Mr. Harewood has a history of multiple admissions for SOB felt secondary to COPD exacerbation vs dCHF exacerbation. He had bilateral thoracenteses in 12/2018. Last admission 02/2019 for worsening SOB. Dry weight thought to be 166 lbs.   He was last seen in clinic on 04/09/19. Jory Sims had recently decreased his 40 mg torsemide BID to 40 mg qAm and 20 mg qPM on 03/11/19. His wife is a retired Therapist, sports and is diligent with daily weights and BP.   Home medications include 80 mg lipitor, 5 mg eliquis BID, digoxin 0.125 mg daily, loperssor 25 mg BID, spironolactone 25 mg daily, torsemide ?40 mg BID, 30 mEq K BID.   Mr. Koudelka was walking between his car and his house without his oxygen. He became acutely short of breath. He denies loss of consciousness. His wife asked him questions but he was unable to respond until he replaced his O2. He firmly denies any altered mental status. He answered all questions once his o2 was replaced. EMS was dispatch. On my interview, wife was available by phone to answer questions. Every month, they report a weight gain of 1-5 lbs requiring increase in torsemide. This recently occurred starting on  Sunday - torsemide was increased to 60 mg BID for three days with resolution of his fluid - lost 4.5 lbs of the 5 lb weight gain. He denies CP, SOB, and swelling today.  BNP mildly elevated to 277 CXR with bilateral pleural effusions and evidence of CHF HS troponin mildly elevated 40 --> 36 EKG does not appear ischemic   Of note, he has seen [facility] for necrotic toes. Planning intervention in early December.    Heart Pathway Score:     Past Medical History:  Diagnosis Date   Acute myocardial infarction, unspecified site, initial episode of care    Anemia    Anxiety    Aortic stenosis     Atrial fibrillation 06/27/2013   Atrial fibrillation (HCC)    a. permanent, on Coumadin for anticoagulation   BPPV (benign paroxysmal positional vertigo) 2015   CAD (coronary artery disease)    a. s/p CABG in 1994 b. cath in 2015 showing normal LM, 100% LCx and RCA stenosis with 50-60% prox LAD stenosis and 100% mid-LAD stenosis; patent sequential SVG-OM2-OM3 and patent LIMA-LAD with PTCA of distal LAD via LIMA graft performed at that time   Carotid stenosis    CAROTID STENOSIS- moderate 09/09/2008   Qualifier: Diagnosis of  By: Burnett Kanaris     Cellulitis 11/15/2018   Chest pain with moderate risk of acute coronary syndrome 06/27/2013   CHF (congestive heart failure) (HCC)    Chronic anticoagulation 06/27/2013   COPD (chronic obstructive pulmonary disease) (HCC)    Depression    Diabetes  mellitus without complication ([place])    FASTING 98-120S   GERD (gastroesophageal reflux disease)    Gout attack- on steroid dose pack 06/27/2013   History of blood transfusion    History of peptic ulcer disease    Hyperlipidemia    Hypertension    Left atrial severely dilated 01/19/2019   Myocardial infarction [facility])    X2   NSTEMI (non-ST elevated myocardial infarction) ([place]) 08/2013   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) 06/04/2013     PEPTIC ULCER DISEASE, HX OF 09/09/2008   Qualifier: Diagnosis of  By: Burnett Kanaris     PERIPHERAL VASCULAR DISEASE 09/09/2008   Qualifier: Diagnosis of  By: Burnett Kanaris     Peripheral vascular disease ([place])    Pneumonia    HX OF   Presence of indwelling Foley catheter 01/20/2019   Pressure injury of skin 12/17/2018   Renal artery stenosis (HCC)    S/P thoracentesis    Severe aortic stenosis 09/17/2017   Sleep apnea    OXYGEN AT NIGHT 2L [place]   Stress fracture of calcaneus 2018   Dr Paulla Dolly (Pod)   Tachycardia- beta blocker increased 06/26/2013   UNSPECIFIED ANEMIA 09/09/2008   Qualifier: Diagnosis of  By: Burnett Kanaris     UNSPECIFIED CEREBROVASCULAR DISEASE 09/09/2008   Qualifier: Diagnosis of  By: Burnett Kanaris     Unstable angina ([place]) 08/31/2013   Vertigo 06/27/2013    Past Surgical History:  Procedure Laterality Date   AORTIC ARCH ANGIOGRAPHY N/A 08/14/2017   Procedure: AORTIC ARCH ANGIOGRAPHY;  Surgeon: Sherren Mocha, MD;  Location: [place] CV LAB;  Service: Cardiovascular;  Laterality: N/A;   BOWEL RESECTION  07/2013   Bowel perforation   CARDIAC CATHETERIZATION     CAROTID ENDARTERECTOMY Right 2014   Curt Jews, MD   COLON SURGERY     CORONARY ARTERY BYPASS GRAFT  1994   ENDARTERECTOMY Right 01/05/2013   Procedure: ENDARTERECTOMY CAROTID;  Surgeon: Rosetta Posner, MD;  Location: [place];  Service: Vascular;  Laterality: Right;   INCISION AND DRAINAGE Left 11/16/2018   Procedure: INCISION AND DRAINAGE LEFT FOREARM/ARM;  Surgeon: Leanora Cover, MD;  Location: [place];  Service: Orthopedics;  Laterality: Left;   KNEE SURGERY  08/2003   left knee/ARTHROSCOPIC   LEFT HEART CATHETERIZATION WITH CORONARY/GRAFT ANGIOGRAM N/A 09/01/2013   Procedure: LEFT HEART CATHETERIZATION WITH Beatrix Fetters;  Surgeon: Sinclair Grooms, MD;  Location: [facility] CATH LAB;  Service: Cardiovascular;  Laterality: N/A;   PATCH ANGIOPLASTY Right 01/05/2013    Procedure: PATCH ANGIOPLASTY;  Surgeon: Rosetta Posner, MD;  Location: [place];  Service: Vascular;  Laterality: Right;   PERCUTANEOUS CORONARY STENT INTERVENTION (PCI-S)  09/01/2013   Procedure: PERCUTANEOUS CORONARY STENT INTERVENTION (PCI-S);  Surgeon: Sinclair Grooms, MD;  Location[facility] CATH LAB;  Service: Cardiovascular;;   removal of bleeding ulcer  1994   RENAL ARTERY STENT     stenting of the left renal artery as well as a cutting balloon angioplasty for treatment of in-stent restenosis coronary artery bypass grafting in 1994.   RIGHT/LEFT HEART CATH AND CORONARY/GRAFT ANGIOGRAPHY N/A 08/14/2017   Procedure: RIGHT/LEFT HEART CATH AND CORONARY/GRAFT ANGIOGRAPHY;  Surgeon: Sherren Mocha, MD;  Location: [place] CV LAB;  Service: Cardiovascular;  Laterality: N/A;   TEE WITHOUT CARDIOVERSION N/A 09/17/2017   Procedure: TRANSESOPHAGEAL ECHOCARDIOGRAM (TEE);  Surgeon: Sherren Mocha, MD;  Location: [place];  Service: Open Heart Surgery;  Laterality: N/A;  Home Medications:  Prior to Admission medications   Medication Sig Start Date End Date Taking? Authorizing Provider  acyclovir (ZOVIRAX) 800 MG tablet Take 400 mg by mouth 2 (two) times daily.   Yes [provider]  allopurinol (ZYLOPRIM) 300 MG tablet Take 300 mg by mouth daily.   Yes [provider]  apixaban (ELIQUIS) 5 MG TABS tablet Take 1 tablet (5 mg total) by mouth 2 (two) times daily. 12/22/18 04/29/19 Yes Welborn, Ryan, DO  atorvastatin (LIPITOR) 80 MG tablet Take 1 tablet (80 mg total) by mouth at bedtime. 12/07/14  Yes Lelon Perla, MD  brimonidine (ALPHAGAN) 0.2 % ophthalmic solution Place 1 drop into both eyes 2 (two) times daily.   Yes [provider]  budesonide-formoterol (SYMBICORT) 80-4.5 MCG/ACT inhaler Inhale 2 puffs into the lungs 2 (two) times daily.  09/05/18  Yes [provider]  Cholecalciferol (VITAMIN D) 50 MCG (2000 UT) tablet Take 1 tablet by mouth daily. 02/13/19  Yes  [provider]  digoxin (LANOXIN) 0.125 MG tablet Take 1 tablet (0.125 mg total) by mouth daily. 01/29/19  Yes Shirley, [place], DO  ferrous sulfate 325 (65 FE) MG tablet Take 1 tablet (325 mg total) by mouth daily with breakfast. 02/22/19  Yes Domenic Polite, MD  insulin aspart (NOVOLOG FLEXPEN) 100 UNIT/ML FlexPen Inject 2 Units into the skin See admin instructions. Inject 2 units into the skin at 9 AM in the morning as needed for a BGL >200   Yes [provider]  lansoprazole (PREVACID) 30 MG capsule Take 60 mg by mouth 2 (two) times daily.    Yes [provider]  levalbuterol (XOPENEX) 0.63 MG/3ML nebulizer solution Take 3 mLs (0.63 mg total) by nebulization every 6 (six) hours as needed for wheezing or shortness of breath. Patient taking differently: Take 0.63 mg by nebulization 3 (three) times daily.  12/22/18  Yes Lurline Del, DO  Melatonin 5 MG TABS Take 5 mg by mouth at bedtime.   Yes [provider]  metFORMIN (GLUCOPHAGE-XR) 500 MG 24 hr tablet Take 500 mg by mouth at bedtime.  12/10/18  Yes [provider]  metoprolol tartrate (LOPRESSOR) 25 MG tablet Take 1 tablet (25 mg total) by mouth 2 (two) times daily. 02/22/19  Yes Domenic Polite, MD  Multiple Vitamin (MULTIVITAMIN WITH MINERALS) TABS Take 1 tablet by mouth daily.   Yes [provider]  NON FORMULARY Take 120 mLs by mouth See admin instructions. MedPass: Drink 120 ml's by mouth two times a day   Yes [provider]  OXYGEN Inhale 3 L/min into the lungs continuous. TO MAINTAIN SATS <90%   Yes [provider]  polyethylene glycol (MIRALAX / GLYCOLAX) 17 g packet Take 17 g by mouth 2 (two) times daily. Patient taking differently: Take 17 g by mouth as needed for mild constipation or moderate constipation.  12/22/18  Yes Welborn, Ryan, DO  potassium chloride (KLOR-CON M15) 15 MEQ tablet Take 2 tablets (30 mEq total) by mouth 2 (two) times daily. Patient taking  differently: Take 15 mEq by mouth 2 (two) times daily.  01/28/19  Yes Enid Derry, [place], DO  spironolactone (ALDACTONE) 25 MG tablet Take 25 mg by mouth daily.   Yes [provider]  tamsulosin (FLOMAX) 0.4 MG CAPS capsule Take 1 capsule (0.4 mg total) by mouth daily after supper. 12/22/18  Yes Welborn, Ryan, DO  torsemide (DEMADEX) 20 MG tablet Take 2 tablets (40 mg total) by mouth 2 (two) times daily. Take  additional 20 mg if S OB, edema or > 2 lbs weight gain 03/26/19  Yes Gonfa, Taye T, MD  trolamine salicylate (ASPERCREME) 10 % cream Apply 1 application topically every 6 (six) hours as needed for muscle pain.    Yes [provider]  umeclidinium bromide (INCRUSE ELLIPTA) 62.5 MCG/INH AEPB Inhale 1 puff into the lungs daily. 11/27/18  Yes Matilde Haymaker, MD    Inpatient Medications: Scheduled Meds:  apixaban  5 mg Oral BID   atorvastatin  80 mg Oral q1800   digoxin  0.125 mg Oral Daily   furosemide  40 mg Intravenous Q12H   insulin aspart  0-15 Units Subcutaneous TID WC   insulin aspart  0-5 Units Subcutaneous QHS   levalbuterol  0.63 mg Nebulization TID   metoprolol tartrate  25 mg Oral BID   mometasone-formoterol  2 puff Inhalation BID   polyethylene glycol  17 g Oral Daily   spironolactone  25 mg Oral Daily   umeclidinium bromide  1 puff Inhalation Daily   Continuous Infusions:  PRN Meds: acetaminophen **OR** acetaminophen, ondansetron **OR** ondansetron (ZOFRAN) IV  Allergies:    Allergies  Allergen Reactions   Bactrim [Sulfamethoxazole-Trimethoprim] Other (See Comments)    "Allergic," per MAR   Levaquin [Levofloxacin In D5w] Diarrhea and Other (See Comments)    "Allergic," per [facility]    Social History:   Social History   Socioeconomic History   Marital status: Married    Spouse name: Not on file   Number of children: Not on file   Years of education: Not on file   Highest education level: Not on file  Occupational History   Not on  file  Social Needs   Financial resource strain: Not on file   Food insecurity    Worry: Not on file    Inability: Not on file   Transportation needs    Medical: Not on file    Non-medical: Not on file  Tobacco Use   Smoking status: Former Smoker    Quit date: 04/27/1993    Years since quitting: 26.0   Smokeless tobacco: Never Used  Substance and Sexual Activity   Alcohol use: No    Alcohol/week: 0.0 standard drinks   Drug use: No   Sexual activity: Yes  Lifestyle   Physical activity    Days per week: Not on file    Minutes per session: Not on file   Stress: Not on file  Relationships   Social connections    Talks on phone: Not on file    Gets together: Not on file    Attends religious service: Not on file    Active member of club or organization: Not on file    Attends meetings of clubs or organizations: Not on file    Relationship status: Not on file   Intimate partner violence    Fear of current or ex partner: Not on file    Emotionally abused: Not on file    Physically abused: Not on file    Forced sexual activity: Not on file  Other Topics Concern   Not on file  Social History Narrative   Social History:   Married    Tobacco Use - No.    Alcohol Use - yes         Family History:   Mother died at age 7 of coronary disease.  Father died at 18 of coronary disease.  He has 13 siblings, almost all of whom have coronary  disease and some with premature onset.    Family History:    Family History  Problem Relation Age of Onset   Coronary artery disease Mother    Heart disease Mother        Before age 92   Hyperlipidemia Mother    Hypertension Mother    Heart attack Mother    Coronary artery disease Father    Heart disease Father        After age 51   Heart attack Father    Heart disease Sister        After age 43   Heart disease Brother        After age 75   Coronary artery disease Other        65 sibling, almost all have  coronary disease and some with premature onset   Heart disease Other        13 sibling, almost all have coronary disease and some with premature onset     ROS:  Please see the history of present illness.   All other ROS reviewed and negative.     Physical Exam/Data:   Vitals:   04/30/19 0800 04/30/19 0830 04/30/19 0845 04/30/19 0900  BP: (!) 138/48 (!) 156/52  (!) 144/53  Pulse: 95 (!) 35 74 72  Resp: 17 16 17 17   Temp:      TempSrc:      SpO2: 100% 100% 100% 100%  Weight:      Height:        Intake/Output Summary (Last 24 hours) at 04/30/2019 1102 Last data filed at 04/30/2019 0534 Gross per 24 hour  Intake --  Output 1450 ml  Net -1450 ml   Last 3 Weights 04/29/2019 04/09/2019 03/26/2019  Weight (lbs) 164 lb 165 lb 167 lb 12.3 oz  Weight (kg) 74.39 kg 74.844 kg 76.1 kg     Body mass index is 22.87 kg/m.  General:  elderly male in no acute distress HEENT: normal Neck: mildly elevated JVD Endocrine:  No thryomegaly Vascular: No carotid bruits  Cardiac:  Irregular rhythm, regular rate, 2/6 systolic murmur rusb, 2/6 diastolc murmur apex Lungs:  Respirations unlabored, diminished in bases Abd: soft, nontender, no hepatomegaly  Ext: mild edema, necrotic appearing toes on both feet Musculoskeletal:  No deformities, BUE and BLE strength normal and equal Skin: warm and dry  Neuro:  CNs 2-12 intact, no focal abnormalities noted Psych:  Normal affect   EKG:  The EKG was personally reviewed and demonstrates:  Atrial fibrillation with ventricular rate 53, PVC Telemetry:  Telemetry was personally reviewed and demonstrates:  Rate controlled Afib with PVCs, bigeminy at times  Relevant CV Studies:  Echo 01/01/19:  1. The left ventricle has low normal systolic function, with an ejection fraction of 50-55%. The cavity size was normal. There is mildly increased left ventricular wall thickness. Left ventricular diastolic Doppler parameters are indeterminate. There is  abnormal  septal motion consistent with post-operative status.  2. The right ventricle has mildly reduced systolic function. The cavity was normal. There is no increase in right ventricular wall thickness. Right ventricular systolic pressure is moderately elevated with an estimated pressure of 55.2 mmHg.  3. Left atrial size was severely dilated.  4. A 26 Edwards Sapien bioprosthetic aortic valve (TAVR) valve is present in the aortic position. Procedure Date: 09/17/17 Echo findings shows no evidence of rocking, dehiscence. Mean systolic gradient 11 mmHg, calculated valve area approximately 2 cm2  using LVOT diameter of 2.6 cm  and LVOT TVI 14 cm. Likely trivial prosthetic aortic valve regurgitation, though challenging to assess whether this is prosthetic or paravalvular due to image quality.  5. The mitral valve is abnormal. There is moderate mitral annular calcification present. Mitral valve regurgitation is mild to moderate by color flow Doppler. Moderate-severe mitral valve stenosis. Mean diastolic gradient 13 mmHg at HR 100-105.  6. Tricuspid valve regurgitation is moderate.  7. When compared to the prior study: 11/24/2018 - no significant change in LV function. No change in degree of mitral valve stenosis. Stable function of aortic prosthesis. Side by side comparison of images performed.  Laboratory Data:  High Sensitivity Troponin:   Recent Labs  Lab 04/29/19 1409 04/29/19 1619  TROPONINIHS 40* 36*     Chemistry Recent Labs  Lab 04/29/19 1409 04/29/19 1617 04/30/19 0438  NA 131* 129* 133*  K 4.1 3.9 3.6  CL 87*  --  91*  CO2 31  --  32  GLUCOSE 159*  --  99  BUN 27*  --  23  CREATININE 1.07  --  1.05  CALCIUM 8.9  --  9.0  GFRNONAA >60  --  >60  GFRAA >60  --  >60  ANIONGAP 13  --  10    No results for input(s): PROT, ALBUMIN, AST, ALT, ALKPHOS, BILITOT in the last 168 hours. Hematology Recent Labs  Lab 04/29/19 1409 04/29/19 1617 04/30/19 0438  WBC 10.3  --  8.5  RBC 2.84*  --   2.87*  HGB 8.0* 7.8* 8.0*  HCT 25.1* 23.0* 25.4*  MCV 88.4  --  88.5  MCH 28.2  --  27.9  MCHC 31.9  --  31.5  RDW 17.3*  --  17.3*  PLT 349  --  349   BNP Recent Labs  Lab 04/29/19 1430  BNP 276.9*    DDimer No results for input(s): DDIMER in the last 168 hours.   Radiology/Studies:  Dg Chest 2 View  Result Date: 04/29/2019 CLINICAL DATA:  Shortness of breath. EXAM: CHEST - 2 VIEW COMPARISON:  03/25/2019. FINDINGS: Prior CABG. Prior cardiac valve replacement. Cardiomegaly with pulmonary vascular prominence bilateral interstitial prominence and bilateral pleural effusions. Findings have progressed from prior exam. Findings are consistent with CHF. No pneumothorax. No acute bony abnormality. IMPRESSION: Prior CABG. Prior cardiac valve replacement. Cardiomegaly with diffuse bilateral from interstitial prominence and bilateral pleural effusions. Findings consistent with CHF. Findings have progressed from prior study of 03/25/2019. Electronically Signed   By: Marcello Moores  Register   On: 04/29/2019 14:50   Ct Head Wo Contrast  Result Date: 04/29/2019 CLINICAL DATA:  Altered mental status. EXAM: CT HEAD WITHOUT CONTRAST TECHNIQUE: Contiguous axial images were obtained from the base of the skull through the vertex without intravenous contrast. COMPARISON:  None. FINDINGS: Brain: No intracranial hemorrhage, mass effect, or midline shift. Generalized atrophy, normal for age. Mild chronic small vessel ischemia. No hydrocephalus. The basilar cisterns are patent. No evidence of territorial infarct or acute ischemia. No extra-axial or intracranial fluid collection. Vascular: Atherosclerosis of skullbase vasculature without hyperdense vessel or abnormal calcification. Skull: No fracture or focal lesion. Sinuses/Orbits: Scattered mucosal thickening of ethmoid air cells. No sinus fluid level. Slight rightward nasal septal deviation. Bilateral cataract resection. The mastoid air cells are clear. Other: None.  IMPRESSION: 1. No acute intracranial abnormality. 2. Age related atrophy and chronic small vessel ischemia. Electronically Signed   By: Keith Rake M.D.   On: 04/29/2019 17:27    Assessment and Plan:  1. Acute on chronic diastolic heart failure - increased home torsemide to 60 mg BID for three days with near resolution of a 5 lb weight gain - on arrival, BNP mildly elevated 277, CXR with evidence of CHF, bilateral pleural effusions improved from July 2020 - agree with IV lasix - currently receiving 60 mg IV BID - has diuresed 1.5 L - weight yesterday morning 164 lbs, dry weight thought to be 164-166 lbs - suspect he will not require prolonged aggressive diuresis since he has recently increased home torsemide with successful diuresis PTA - strict I&Os, daily weights   2. Permanent atrial fibrillation 3. Chronic anticoagulation - rate controlled with lopressor 25 mg BID and digoxin 0.125 mg daily - chronic anemia for which GI has previously recommended conservative treatment - Hb today 8.0, baseline appears to be 8-9 - no signs of bleeding - continue home medications - telemetry with frequent PVCs - Mg is 2.2, K 3.6, he is asymptomatic with this rhythm and ectopy - digoxin level 1.6   4. CAD s/p CABG - patent grafts by angiography in 2019 prior to TAVR - denies anginal symptoms - mildly elevated hs troponin likely demand ischemia following a hypoxic episode - do not anticipate ischemic workup   5. AS s/p TAVR (09/2017) - echo 12/2018 with stable bioprosthetic valve   6. Mitral stenosis - moderate to severe - noted on recent echo - likely contributing to the above exacerbation  - previously deemed not a surgical candidate      For questions or updates, please contact [place] Please consult [email or web address] for contact info under     Signed, Ledora Bottcher, [place]  04/30/2019 11:02 AM   Patient seen and discussed with PA Duke, I agree with her documentation.  85 yo male history of CAD with prior CABG, permanent afib, history of AS now s/p TAVR, mitral stenosis, chronic pleural effusions with multiple prior thoracentesis, COPD on home O2, DNR/DNI status.  Presents with near syncopal episode after being off his home O2 and becoming hypoxia. Evidence of volume overload on presentation, cardiology consulted to help manage    12/2018 echo: LVEF 19-62%, indet diastolic function, mild RV dysfunction, PASP 55, severe LAE, AVR mean grad 11 with normal function, mild to mod MR, mod to severe mitral stenosis with mean grad 13   Yesterdays labs K 4.1 Cr 1.07 BUN 27 WBC 10.3 Hgb 8 Plt 349 BNP 277 Mg 2.2  hstrop 36--> COVID neg CXR +pulm edema CT head: no acute process EKG afib, LBBB  08/2017 cath CTO of LAD, LCX, RCA. Patent LIMA-LAD, SVG-OM1 and OM2. Moderate stenosis of the apical LAD at site of prior angioplasty.  RHC mean PA 47, PCWP 25, LVEDP 22.    Presents with acute on chronic diastolic HF complicated by mitral stenosis and severe pulmonary HTN. Based on RHC his pulm HTN is likely multifactorial including his mitral stenosis and left heart disease but also likely his lung disease. He is not a candidate for mitral valve intervention. The combination of these issues leads to labile volume status. [name] Appears his wife adjusted his torsemide at home and did have some improvement in his volume status and symptoms. With his CXR findings would plan for at least a day or so of IV diuretics in the hospital lasix, would dose IV lasix 60mg  x 2 today and reassess tomorrow AM.   History of CAD with cath from 2019 as reported above. Do not suspect acute issue at this time  Permanent afib, he is on eliquis for stroke prevention. Defer choice of anticoag agent to primary cardiologist. His rates are reasonable.   Near syncopal event in setting of hypoxia and being off his home O2, no further cardiac workup indicated.   Carlyle Dolly MD

## 2019-04-30 NOTE — ED Notes (Signed)
Heart healthy/ carb modified breakfast tray ordered

## 2019-04-30 NOTE — Progress Notes (Signed)
PROGRESS NOTE    [name]  IRW:[id number] DOB: Oct 15, 1931 DOA: 04/29/2019 PCP: Nicoletta Dress, MD  Brief Narrative: [name] is a 85 y.o. male with medical history significant of coronary artery disease status post CABG, chronic diastolic congestive heart failure, A. fib on Eliquis, aortic stenosis status post TAVR, severe pulmonary hypertension, COPD on 3 L of oxygen at home, type 2 diabetes mellitus, hypertension, hyperlipidemia, anemia, urinary retention with chronic indwelling Foley catheter, obstructive sleep apnea, numerous frequent hospitalizations with CHF was brought to the ED due to altered level of consciousness. -Reportedly patient was walking from his house to the car and did not have his oxygen on at the time and when he reached near the car became acutely dyspneic and was not able to communicate.  Patient denies loss of consciousness but wife reports that he was not responding for about 8 to 10 minutes, wife called EMS and he was brought to the emergency room. -Patient had recent five and weight gain for which his wife increased dose of torsemide for 3 days, as a result of this he lost some of his extra weight. -He also has dry gangrenous changes in toes on both feet and is due to see a vascular surgeon early December for this -In the emergency room he was noted to be tachypneic bradycardic, chest x-ray was concerning for interstitial edema and bilateral pleural effusions  Assessment & Plan:   Transient altered consciousness -Likely hypoxia mediated, resolved -I do not think he needs additional work-up for this, CT head is unrevealing and neurological exam is nonfocal  Acute on chronic diastolic CHF Severe pulmonary hypertension -Moderate to severe mitral stenosis -Status post TAVR 09/2017 -This is his seventh hospitalization in 6 months with fluid overload -Diurese with IV Lasix, continue 40mg  Q12, at baseline patient is on torsemide 40 mg twice daily with  additional 20 mg as needed for weight gain - patient and wife report good compliance with diuretics and diet -Overall prognosis is poor -Will request cardiology input due to frequent hospitalizations for CHF -Has also been seen by palliative care couple of months ago   Dry gangrene both feet PAD -Has an upcoming appointment with vascular surgery in early December -No secondary infection -Monitor  Chronic atrial fibrillation -Continue Lopressor digoxin and Eliquis  Chronic anemia -Anemia panel last month suggestive of iron deficiency and chronic disease -Was seen by gastroenterology Dr. Therisa Doyne in September, no plans for endoscopic evaluation due to very poor cardiopulmonary status -Will give IV iron x1 now, monitor hemoglobin  CAD/CABG -Stable, monitor  COPD/chronic respiratory failure -3 L home O2 at baseline -No wheezing at this time, nebs PRN  Type 2 diabetes mellitus -Hold Metformin, continue sliding scale insulin -Last hbA1c is 5.6  History of urinary retention with chronic indwelling Foley catheter -Routine catheter care  DVT prophylaxis: Eliquis Code Status: DNR Family Communication: No family at bedside Disposition Plan: Home in 1 to 2 days  Consultants:   Cardiology   Procedures:   Antimicrobials:    Subjective: -Upset about being n.p.o., patient denies any changes in his symptoms from baseline, which is chronic dyspnea on exertion  Objective: Vitals:   04/30/19 0800 04/30/19 0830 04/30/19 0845 04/30/19 0900  BP: (!) 138/48 (!) 156/52  (!) 144/53  Pulse: 95 (!) 35 74 72  Resp: 17 16 17 17   Temp:      TempSrc:      SpO2: 100% 100% 100% 100%  Weight:  Height:        Intake/Output Summary (Last 24 hours) at 04/30/2019 1034 Last data filed at 04/30/2019 0534 Gross per 24 hour  Intake -  Output 1450 ml  Net -1450 ml   Filed Weights   04/29/19 1405  Weight: 74.4 kg    Examination:  General exam: Elderly frail chronically ill  male sitting up in bed AAOx3, no distress Respiratory system: Poor air movement bilaterally, few basilar rails Cardiovascular system: S1-S2, irregular rhythm, systolic murmur noted  gastrointestinal system: Abdomen is nondistended, soft and nontender.Normal bowel sounds heard. Central nervous system: Alert and oriented. No focal neurological deficits. Extremities: No edema, dry gangrenous changes in the toes of both feet Skin: As above Psychiatry:  Mood & affect appropriate.     Data Reviewed:   CBC: Recent Labs  Lab 04/29/19 1409 04/29/19 1617 04/30/19 0438  WBC 10.3  --  8.5  HGB 8.0* 7.8* 8.0*  HCT 25.1* 23.0* 25.4*  MCV 88.4  --  88.5  PLT 349  --  443   Basic Metabolic Panel: Recent Labs  Lab 04/29/19 1409 04/29/19 1617 04/29/19 1858 04/30/19 0438  NA 131* 129*  --  133*  K 4.1 3.9  --  3.6  CL 87*  --   --  91*  CO2 31  --   --  32  GLUCOSE 159*  --   --  99  BUN 27*  --   --  23  CREATININE 1.07  --   --  1.05  CALCIUM 8.9  --   --  9.0  MG  --   --  2.2  --   PHOS  --   --  3.6  --    GFR: Estimated Creatinine Clearance: 54.1 mL/min (by C-G formula based on SCr of 1.05 mg/dL). Liver Function Tests: No results for input(s): AST, ALT, ALKPHOS, BILITOT, PROT, ALBUMIN in the last 168 hours. No results for input(s): LIPASE, AMYLASE in the last 168 hours. No results for input(s): AMMONIA in the last 168 hours. Coagulation Profile: No results for input(s): INR, PROTIME in the last 168 hours. Cardiac Enzymes: No results for input(s): CKTOTAL, CKMB, CKMBINDEX, TROPONINI in the last 168 hours. BNP (last 3 results) No results for input(s): PROBNP in the last 8760 hours. HbA1C: No results for input(s): HGBA1C in the last 72 hours. CBG: Recent Labs  Lab 04/29/19 1402 04/29/19 2148 04/30/19 0050 04/30/19 0758  GLUCAP 141* 96 87 88   Lipid Profile: No results for input(s): CHOL, HDL, LDLCALC, TRIG, CHOLHDL, LDLDIRECT in the last 72 hours. Thyroid Function  Tests: No results for input(s): TSH, T4TOTAL, FREET4, T3FREE, THYROIDAB in the last 72 hours. Anemia Panel: No results for input(s): VITAMINB12, FOLATE, FERRITIN, TIBC, IRON, RETICCTPCT in the last 72 hours. Urine analysis:    Component Value Date/Time   COLORURINE AMBER (A) 01/19/2019 1647   APPEARANCEUR CLOUDY (A) 01/19/2019 1647   LABSPEC 1.012 01/19/2019 1647   PHURINE 9.0 (H) 01/19/2019 1647   GLUCOSEU NEGATIVE 01/19/2019 1647   HGBUR MODERATE (A) 01/19/2019 1647   BILIRUBINUR NEGATIVE 01/19/2019 1647   KETONESUR NEGATIVE 01/19/2019 1647   PROTEINUR 100 (A) 01/19/2019 1647   UROBILINOGEN 0.2 12/23/2012 1026   NITRITE POSITIVE (A) 01/19/2019 1647   LEUKOCYTESUR MODERATE (A) 01/19/2019 1647   Sepsis Labs: @LABRCNTIP (procalcitonin:4,lacticidven:4)  ) Recent Results (from the past 240 hour(s))  SARS CORONAVIRUS 2 (TAT 6-24 HRS) Nasopharyngeal Nasopharyngeal Swab     Status: None   Collection Time: 04/29/19  4:19 PM   Specimen: Nasopharyngeal Swab  Result Value Ref Range Status   SARS Coronavirus 2 NEGATIVE NEGATIVE Final    Comment: (NOTE) SARS-CoV-2 target nucleic acids are NOT DETECTED. The SARS-CoV-2 RNA is generally detectable in upper and lower respiratory specimens during the acute phase of infection. Negative results do not preclude SARS-CoV-2 infection, do not rule out co-infections with other pathogens, and should not be used as the sole basis for treatment or other patient management decisions. Negative results must be combined with clinical observations, patient history, and epidemiological information. The expected result is Negative. Fact Sheet for Patients: SugarRoll.be Fact Sheet for Healthcare Providers: https://www.woods-mathews.com/ This test is not yet approved or cleared by the [place] FDA and  has been authorized for detection and/or diagnosis of SARS-CoV-2 by FDA under an Emergency Use Authorization  (EUA). This EUA will remain  in effect (meaning this test can be used) for the duration of the COVID-19 declaration under Section 56 4(b)(1) of the Act, 21 U.S.C. section 360bbb-3(b)(1), unless the authorization is terminated or revoked sooner. Performed at [facility], [place] [address], [place], [place] [address]          Radiology Studies: Dg Chest 2 View  Result Date: 04/29/2019 CLINICAL DATA:  Shortness of breath. EXAM: CHEST - 2 VIEW COMPARISON:  03/25/2019. FINDINGS: Prior CABG. Prior cardiac valve replacement. Cardiomegaly with pulmonary vascular prominence bilateral interstitial prominence and bilateral pleural effusions. Findings have progressed from prior exam. Findings are consistent with CHF. No pneumothorax. No acute bony abnormality. IMPRESSION: Prior CABG. Prior cardiac valve replacement. Cardiomegaly with diffuse bilateral from interstitial prominence and bilateral pleural effusions. Findings consistent with CHF. Findings have progressed from prior study of 03/25/2019. Electronically Signed   By: Marcello Moores  Register   On: 04/29/2019 14:50   Ct Head Wo Contrast  Result Date: 04/29/2019 CLINICAL DATA:  Altered mental status. EXAM: CT HEAD WITHOUT CONTRAST TECHNIQUE: Contiguous axial images were obtained from the base of the skull through the vertex without intravenous contrast. COMPARISON:  None. FINDINGS: Brain: No intracranial hemorrhage, mass effect, or midline shift. Generalized atrophy, normal for age. Mild chronic small vessel ischemia. No hydrocephalus. The basilar cisterns are patent. No evidence of territorial infarct or acute ischemia. No extra-axial or intracranial fluid collection. Vascular: Atherosclerosis of skullbase vasculature without hyperdense vessel or abnormal calcification. Skull: No fracture or focal lesion. Sinuses/Orbits: Scattered mucosal thickening of ethmoid air cells. No sinus fluid level. Slight rightward nasal septal deviation. Bilateral cataract  resection. The mastoid air cells are clear. Other: None. IMPRESSION: 1. No acute intracranial abnormality. 2. Age related atrophy and chronic small vessel ischemia. Electronically Signed   By: Keith Rake M.D.   On: 04/29/2019 17:27        Scheduled Meds: . apixaban  5 mg Oral BID  . atorvastatin  80 mg Oral q1800  . digoxin  0.125 mg Oral Daily  . furosemide  40 mg Intravenous Q12H  . insulin aspart  0-15 Units Subcutaneous TID WC  . insulin aspart  0-5 Units Subcutaneous QHS  . levalbuterol  0.63 mg Nebulization TID  . mometasone-formoterol  2 puff Inhalation BID  . polyethylene glycol  17 g Oral Daily  . spironolactone  25 mg Oral Daily  . umeclidinium bromide  1 puff Inhalation Daily   Continuous Infusions:   LOS: 1 day    Time spent: 52min    Domenic Polite, MD [facility] Page via [email or web address], password [email or web address] After 7PM please contact night-coverage  04/30/2019, 10:34 AM

## 2019-04-30 NOTE — Evaluation (Signed)
Physical Therapy Evaluation Patient Details Name: [name] MRN: [id number] DOB: [birth year] Today's Date: 04/30/2019   History of Present Illness  85 y.o. male with medical history significant of coronary artery disease status post CABG, chronic diastolic congestive heart failure, A. fib on Eliquis, aortic stenosis status post TAVR, severe pulmonary hypertension, COPD on 3 L of oxygen at home, type 2 diabetes mellitus, hypertension, hyperlipidemia, anemia, urinary retention with chronic indwelling Foley catheter, obstructive sleep apnea, numerous frequent hospitalizations with CHF was brought to the ED due to altered level of consciousness  Clinical Impression  Pt demonstrates deficits in gait, functional mobility, balance, power, endurance. Pt greatly limited by SOB during ambulation, with saturation falling to at least 91% on 3L [place] (SpO2 with unreliable reading upon initial attempts at taking oxygen saturation). Pt will benefit form continued acute PT services to improve endurance and activity tolerance as well as LE power and balance. PT anticipates pt will progress well and be fit for discharge home with HHPT and assistance from spouse.    Follow Up Recommendations Home health PT;Supervision/Assistance - 24 hour    Equipment Recommendations  None recommended by PT(pt owns necessary DME)    Recommendations for Other Services       Precautions / Restrictions Precautions Precautions: Fall Restrictions Weight Bearing Restrictions: No      Mobility  Bed Mobility Overal bed mobility: Needs Assistance Bed Mobility: Supine to Sit;Sit to Supine     Supine to sit: Supervision;HOB elevated Sit to supine: Supervision;HOB elevated      Transfers Overall transfer level: Needs assistance Equipment used: Rolling walker (2 wheeled) Transfers: Sit to/from Stand Sit to Stand: Supervision            Ambulation/Gait Ambulation/Gait assistance: Supervision Gait Distance (Feet):  60 Feet Assistive device: Rolling walker (2 wheeled) Gait Pattern/deviations: Step-through pattern Gait velocity: reduced Gait velocity interpretation: <1.8 ft/sec, indicate of risk for recurrent falls General Gait Details: pt with slowed step through gait, reduced gait speed, increased work of breathing  Financial trader Rankin (Stroke Patients Only)       Balance Overall balance assessment: Needs assistance Sitting-balance support: No upper extremity supported;Feet supported Sitting balance-Leahy Scale: Good     Standing balance support: Bilateral upper extremity supported Standing balance-Leahy Scale: Good Standing balance comment: supervision                             Pertinent Vitals/Pain Pain Assessment: 0-10 Pain Score: 6  Pain Location: toes Pain Descriptors / Indicators: Aching Pain Intervention(s): Limited activity within patient's tolerance    Home Living Family/patient expects to be discharged to:: Private residence Living Arrangements: Spouse/significant other Available Help at Discharge: Family;Available 24 hours/day Type of Home: House Home Access: Stairs to enter Entrance Stairs-Rails: Psychiatric nurse of Steps: 4 Home Layout: One level Home Equipment: Cane - single point;Walker - 2 wheels;Shower seat;Bedside commode;Crutches      Prior Function Level of Independence: Needs assistance   Gait / Transfers Assistance Needed: Was using RW for mobility tasks.   ADL's / Homemaking Assistance Needed: Pt reports wife assists him with all ADLs  Comments: wife former Therapist, sports, in good health     Hand Dominance   Dominant Hand: Right    Extremity/Trunk Assessment   Upper Extremity Assessment Upper Extremity Assessment: Overall WFL for tasks assessed    Lower  Extremity Assessment Lower Extremity Assessment: Generalized weakness    Cervical / Trunk Assessment Cervical / Trunk  Assessment: Normal  Communication   Communication: HOH  Cognition Arousal/Alertness: Awake/alert Behavior During Therapy: WFL for tasks assessed/performed Overall Cognitive Status: Within Functional Limits for tasks assessed                                        General Comments General comments (skin integrity, edema, etc.): Pt reporting increased work of breath and SOB during ambulation while on 3L [place]. Pt saturating 100% at rest on 3.5L in room, portable SpO2 monitor taking ~1 minute for accurate reading after sitting upon completion of ambulation with pt SpO2 at 91%. Pt recovers to 95% within one additional minute of sitting    Exercises     Assessment/Plan    PT Assessment Patient needs continued PT services  PT Problem List Decreased strength;Decreased balance;Decreased activity tolerance;Decreased mobility;Cardiopulmonary status limiting activity;Pain       PT Treatment Interventions DME instruction;Gait training;Stair training;Functional mobility training;Therapeutic activities;Therapeutic exercise;Balance training;Neuromuscular re-education;Patient/family education    PT Goals (Current goals can be found in the Care Plan section)  Acute Rehab PT Goals Patient Stated Goal: To return to PLOF and go hme ASAP PT Goal Formulation: With patient Time For Goal Achievement: 05/14/19 Potential to Achieve Goals: Good    Frequency Min 3X/week   Barriers to discharge        Co-evaluation               AM-PAC PT "6 Clicks" Mobility  Outcome Measure Help needed turning from your back to your side while in a flat bed without using bedrails?: None Help needed moving from lying on your back to sitting on the side of a flat bed without using bedrails?: None Help needed moving to and from a bed to a chair (including a wheelchair)?: None Help needed standing up from a chair using your arms (e.g., wheelchair or bedside chair)?: None Help needed to walk in  hospital room?: None Help needed climbing 3-5 steps with a railing? : A Little 6 Click Score: 23    End of Session Equipment Utilized During Treatment: Oxygen Activity Tolerance: Patient limited by fatigue;Other (comment)(SOB) Patient left: in bed;with call bell/phone within reach;with nursing/sitter in room Nurse Communication: Mobility status PT Visit Diagnosis: Muscle weakness (generalized) (M62.81)    Time: 3300-7622 PT Time Calculation (min) (ACUTE ONLY): 19 min   Charges:   PT Evaluation $PT Eval Low Complexity: [place], PT, DPT Acute Rehabilitation Pager: [phone]   Zenaida Niece 04/30/2019, 5:30 PM

## 2019-04-30 NOTE — ED Notes (Signed)
[name] CBG result was 88. Informed Anna - RN.

## 2019-05-01 ENCOUNTER — Inpatient Hospital Stay (HOSPITAL_COMMUNITY): Payer: Medicare Other

## 2019-05-01 DIAGNOSIS — R55 Syncope and collapse: Secondary | ICD-10-CM

## 2019-05-01 LAB — BASIC METABOLIC PANEL
Anion gap: 10 (ref 5–15)
BUN: 21 mg/dL (ref 8–23)
CO2: 33 mmol/L — ABNORMAL HIGH (ref 22–32)
Calcium: 9.2 mg/dL (ref 8.9–10.3)
Chloride: 92 mmol/L — ABNORMAL LOW (ref 98–111)
Creatinine, Ser: 0.96 mg/dL (ref 0.61–1.24)
GFR calc Af Amer: >60 mL/min (ref >60)
GFR calc non Af Amer: >60 mL/min (ref >60)
Glucose, Bld: 163 mg/dL — ABNORMAL HIGH (ref 70–99)
Potassium: 3.5 mmol/L (ref 3.5–5.1)
Sodium: 135 mmol/L (ref 135–145)

## 2019-05-01 LAB — CBC
HCT: 24.1 % — ABNORMAL LOW (ref 39.0–52.0)
HCT: 24.3 % — ABNORMAL LOW (ref 39.0–52.0)
Hemoglobin: 7.6 g/dL — ABNORMAL LOW (ref 13.0–17.0)
Hemoglobin: 7.8 g/dL — ABNORMAL LOW (ref 13.0–17.0)
MCH: 27.4 pg (ref 26.0–34.0)
MCH: 27.4 pg (ref 26.0–34.0)
MCHC: 31.5 g/dL (ref 30.0–36.0)
MCHC: 32.1 g/dL (ref 30.0–36.0)
MCV: 87 fL (ref 80.0–100.0)
MCV: 85.3 fL (ref 80.0–100.0)
Platelets: 334 10*3/uL (ref 150–400)
Platelets: 284 10*3/uL (ref 150–400)
RBC: 2.77 MIL/uL — ABNORMAL LOW (ref 4.22–5.81)
RBC: 2.85 MIL/uL — ABNORMAL LOW (ref 4.22–5.81)
RDW: 17.3 % — ABNORMAL HIGH (ref 11.5–15.5)
RDW: 18.6 % — ABNORMAL HIGH (ref 11.5–15.5)
WBC: 9.1 10*3/uL (ref 4.0–10.5)
WBC: 8.5 10*3/uL (ref 4.0–10.5)
nRBC: 0 % (ref 0.0–0.2)
nRBC: 0 % (ref 0.0–0.2)

## 2019-05-01 LAB — PREPARE RBC (CROSSMATCH)

## 2019-05-01 LAB — GLUCOSE, CAPILLARY
Glucose-Capillary: 94 mg/dL (ref 70–99)
Glucose-Capillary: 131 mg/dL — ABNORMAL HIGH (ref 70–99)

## 2019-05-01 MED ORDER — ORAL CARE MOUTH RINSE: 15.0000 mL | Freq: Two times a day (BID) | OROMUCOSAL | Status: DC

## 2019-05-01 MED ORDER — SODIUM CHLORIDE 0.9% IV SOLUTION
Freq: Once | INTRAVENOUS | Status: AC
  Administered 2019-05-01: 13:00:00 via INTRAVENOUS

## 2019-05-01 MED ORDER — FUROSEMIDE 10 MG/ML IJ SOLN: 60.0000 mg | Freq: Two times a day (BID) | INTRAMUSCULAR | Status: DC

## 2019-05-01 MED ORDER — SODIUM CHLORIDE 0.9 % IV SOLN
510.0000 mg | Freq: Once | INTRAVENOUS | Status: AC
  Administered 2019-05-01: 09:00:00 510 mg via INTRAVENOUS
  Filled 2019-05-01: qty 17

## 2019-05-01 MED ORDER — POLYVINYL ALCOHOL 1.4 % OP SOLN
1.0000 [drp] | OPHTHALMIC | Status: DC | PRN
  Administered 2019-05-01: 15:00:00 1 [drp] via OPHTHALMIC
  Filled 2019-05-01: qty 15

## 2019-05-01 NOTE — Discharge Summary (Signed)
Physician Discharge Summary  WALLACE GAPPA ATF:[id number] DOB: 25-Feb-1932 DOA: 04/29/2019  PCP: Nicoletta Dress, MD  Admit date: 04/29/2019 Discharge date: 05/01/2019  Time spent: 35 minutes  Recommendations for Outpatient Follow-up:  1. Routine catheter care 2. PCP in 1 week 3. Cardiology Dr. Stanford Breed in 10 days 4. Palliative care follow-up with [facility] 5. Vascular surgery on 12/7 for PAD with dry gangrene in both toes   Discharge Diagnoses:  Transient altered awareness Acute on chronic diastolic CHF Severe pulmonary hypertension COPD/chronic respiratory failure on 3 L home O2 Dry gangrene of both toes Peripheral arterial disease   Anemia   Hypertension   Chronic anticoagulation   Atrial fibrillation (HCC)   S/P TAVR (transcatheter aortic valve replacement)   Chronic hypoxemic respiratory failure (HCC)   COPD (chronic obstructive pulmonary disease) (HCC)   Congestive heart failure (CHF) ([place])   Type 2 diabetes mellitus ([place])   Hyperlipidemia   Discharge Condition: Stable  Diet recommendation: Diabetic, heart healthy  Filed Weights   04/29/19 1405 04/30/19 1328  Weight: 74.4 kg 75.2 kg    History of present illness:  [name] Kleckner Ellingtonis a 85 y.o.malewith medical history significant ofcoronary artery disease status post CABG, chronic diastolic congestive heart failure, A. fib on Eliquis, aortic stenosis status post TAVR, severe pulmonary hypertension, COPD on 3 L of oxygen at home, type 2 diabetes mellitus, hypertension, hyperlipidemia, anemia, urinary retention with chronic indwelling Foley catheter, obstructive sleep apnea, numerous frequent hospitalizations with CHF was brought to the ED due to altered level of consciousness. -Reportedly patient was walking from his house to the car and did not have his oxygen on at the time and when he reached near the car became acutely dyspneic and was not able to communicate.  Patient denies loss of consciousness  but wife reports that he was not responding for about 8 to 10 minutes, wife called EMS and he was brought to the emergency room. -Patient had recent five and weight gain for which his wife increased dose of torsemide for 3 days, as a result of this he lost some of his extra weight. -He also has dry gangrenous changes in toes on both feet and is due to see a vascular surgeon early December for this -In the emergency room he was noted to be tachypneic bradycardic, chest x-ray was concerning for interstitial edema and bilateral pleural effusions  Hospital Course:   Transient altered consciousness -Likely hypoxia mediated, walked without his oxygen tank to his car, resolved -I do not think he needs additional work-up for this, CT head is unrevealing and neurological exam is nonfocal  Acute on chronic diastolic CHF Severe pulmonary hypertension -Moderate to severe mitral stenosis -Status post TAVR 09/2017 -This is his seventh hospitalization in 6 months with fluid overload -Diurese with IV Lasix, continue 40mg  Q12, at baseline patient is on torsemide 40 mg twice daily with additional 20 mg as needed for weight gain - patient and wife report good compliance with diuretics and diet -Overall prognosis is poor -Seen by cardiology, clinically felt to be close to euvolemic, discharged home on torsemide 40 mg twice daily with additional 20 mg as needed for weight gain -Palliative care follow-up at home -Cardiology follow-up with Dr. Stanford Breed in 10 days  Dry gangrene both feet PAD -Has an upcoming appointment with vascular surgery in early December -No secondary infection  Chronic atrial fibrillation -Continue Lopressor digoxin and Eliquis  Chronic anemia -Anemia panel last month suggestive of iron deficiency and chronic disease -  Was seen by gastroenterology Dr. Therisa Doyne in September, no plans for endoscopic evaluation due to very poor cardiopulmonary status -Given IV iron, transfused 1 unit of  PRBC for hemoglobin of 7.6 today baseline is in the low 8s -Continue oral iron at discharge  CAD/CABG -Stable, monitor  COPD/chronic respiratory failure -3 L home O2 at baseline -No wheezing at this time, nebs PRN  Type 2 diabetes mellitus -Hold Metformin, continue sliding scale insulin -Last hbA1c is 5.6  History of urinary retention with chronic indwelling Foley catheter -Routine catheter care  Code Status: DNR  Discharge Exam: Vitals:   05/01/19 0858 05/01/19 1221  BP:  (!) 137/43  Pulse: 65 61  Resp: 18 18  Temp:  98.9 F (37.2 C)  SpO2: 98% 100%    General: AAOx3 Cardiovascular: S1S2/RRR Respiratory: CTAB  Discharge Instructions   Discharge Instructions    Diet - low sodium heart healthy   Complete by: As directed    Diet Carb Modified   Complete by: As directed    Increase activity slowly   Complete by: As directed      Allergies as of 05/01/2019      Reactions   Bactrim [sulfamethoxazole-trimethoprim] Other (See Comments)   "Allergic," per [facility]   Levaquin [levofloxacin In D5w] Diarrhea, Other (See Comments)   "Allergic," per [facility]      Medication List    STOP taking these medications   acyclovir 800 MG tablet Commonly known as: ZOVIRAX     TAKE these medications   allopurinol 300 MG tablet Commonly known as: ZYLOPRIM Take 300 mg by mouth daily.   apixaban 5 MG Tabs tablet Commonly known as: ELIQUIS Take 1 tablet (5 mg total) by mouth 2 (two) times daily.   atorvastatin 80 MG tablet Commonly known as: LIPITOR Take 1 tablet (80 mg total) by mouth at bedtime.   brimonidine 0.2 % ophthalmic solution Commonly known as: ALPHAGAN Place 1 drop into both eyes 2 (two) times daily.   digoxin 0.125 MG tablet Commonly known as: LANOXIN Take 1 tablet (0.125 mg total) by mouth daily.   ferrous sulfate 325 (65 FE) MG tablet Take 1 tablet (325 mg total) by mouth daily with breakfast.   lansoprazole 30 MG capsule Commonly known as:  PREVACID Take 60 mg by mouth 2 (two) times daily.   levalbuterol 0.63 MG/3ML nebulizer solution Commonly known as: XOPENEX Take 3 mLs (0.63 mg total) by nebulization every 6 (six) hours as needed for wheezing or shortness of breath. What changed: when to take this   Melatonin 5 MG Tabs Take 5 mg by mouth at bedtime.   metFORMIN 500 MG 24 hr tablet Commonly known as: GLUCOPHAGE-XR Take 500 mg by mouth at bedtime.   metoprolol tartrate 25 MG tablet Commonly known as: LOPRESSOR Take 1 tablet (25 mg total) by mouth 2 (two) times daily.   multivitamin with minerals Tabs tablet Take 1 tablet by mouth daily.   NON FORMULARY Take 120 mLs by mouth See admin instructions. MedPass: Drink 120 ml's by mouth two times a day   NovoLOG FlexPen 100 UNIT/ML FlexPen Generic drug: insulin aspart Inject 2 Units into the skin See admin instructions. Inject 2 units into the skin at 9 AM in the morning as needed for a BGL >200   OXYGEN Inhale 3 L/min into the lungs continuous. TO MAINTAIN SATS <90%   polyethylene glycol 17 g packet Commonly known as: MIRALAX / GLYCOLAX Take 17 g by mouth 2 (two) times daily. What  changed:   when to take this  reasons to take this   potassium chloride SA 15 MEQ tablet Commonly known as: KLOR-CON M15 Take 2 tablets (30 mEq total) by mouth 2 (two) times daily. What changed: how much to take   spironolactone 25 MG tablet Commonly known as: ALDACTONE Take 25 mg by mouth daily.   Symbicort 80-4.5 MCG/ACT inhaler Generic drug: budesonide-formoterol Inhale 2 puffs into the lungs 2 (two) times daily.   tamsulosin 0.4 MG Caps capsule Commonly known as: FLOMAX Take 1 capsule (0.4 mg total) by mouth daily after supper.   torsemide 20 MG tablet Commonly known as: DEMADEX Take 2 tablets (40 mg total) by mouth 2 (two) times daily. Take additional 20 mg if S OB, edema or > 2 lbs weight gain   trolamine salicylate 10 % cream Commonly known as: ASPERCREME Apply  1 application topically every 6 (six) hours as needed for muscle pain.   umeclidinium bromide 62.5 MCG/INH Aepb Commonly known as: INCRUSE ELLIPTA Inhale 1 puff into the lungs daily.   Vitamin D 50 MCG (2000 UT) tablet Take 1 tablet by mouth daily.      Allergies  Allergen Reactions  . Bactrim [Sulfamethoxazole-Trimethoprim] Other (See Comments)    "Allergic," per [facility]  . Levaquin [Levofloxacin In D5w] Diarrhea and Other (See Comments)    "Allergic," per [facility]   Follow-up Information    Nicoletta Dress, MD. Schedule an appointment as soon as possible for a visit in 1 week(s).   Specialty: Internal Medicine Contact information: [place] [address] [phone]        Lelon Perla, MD. Schedule an appointment as soon as possible for a visit in 10 day(s).   Specialty: Cardiology Contact information: [address] [place] [address] [phone]            The results of significant diagnostics from this hospitalization (including imaging, microbiology, ancillary and laboratory) are listed below for reference.    Significant Diagnostic Studies: Dg Chest 2 View  Result Date: 04/29/2019 CLINICAL DATA:  Shortness of breath. EXAM: CHEST - 2 VIEW COMPARISON:  03/25/2019. FINDINGS: Prior CABG. Prior cardiac valve replacement. Cardiomegaly with pulmonary vascular prominence bilateral interstitial prominence and bilateral pleural effusions. Findings have progressed from prior exam. Findings are consistent with CHF. No pneumothorax. No acute bony abnormality. IMPRESSION: Prior CABG. Prior cardiac valve replacement. Cardiomegaly with diffuse bilateral from interstitial prominence and bilateral pleural effusions. Findings consistent with CHF. Findings have progressed from prior study of 03/25/2019. Electronically Signed   By: Marcello Moores  Register   On: 04/29/2019 14:50   Ct Head Wo Contrast  Result Date: 04/29/2019 CLINICAL DATA:   Altered mental status. EXAM: CT HEAD WITHOUT CONTRAST TECHNIQUE: Contiguous axial images were obtained from the base of the skull through the vertex without intravenous contrast. COMPARISON:  None. FINDINGS: Brain: No intracranial hemorrhage, mass effect, or midline shift. Generalized atrophy, normal for age. Mild chronic small vessel ischemia. No hydrocephalus. The basilar cisterns are patent. No evidence of territorial infarct or acute ischemia. No extra-axial or intracranial fluid collection. Vascular: Atherosclerosis of skullbase vasculature without hyperdense vessel or abnormal calcification. Skull: No fracture or focal lesion. Sinuses/Orbits: Scattered mucosal thickening of ethmoid air cells. No sinus fluid level. Slight rightward nasal septal deviation. Bilateral cataract resection. The mastoid air cells are clear. Other: None. IMPRESSION: 1. No acute intracranial abnormality. 2. Age related atrophy and chronic small vessel ischemia. Electronically Signed   By:  Keith Rake M.D.   On: 04/29/2019 17:27    Microbiology: Recent Results (from the past 240 hour(s))  SARS CORONAVIRUS 2 (TAT 6-24 HRS) Nasopharyngeal Nasopharyngeal Swab     Status: None   Collection Time: 04/29/19  4:19 PM   Specimen: Nasopharyngeal Swab  Result Value Ref Range Status   SARS Coronavirus 2 NEGATIVE NEGATIVE Final    Comment: (NOTE) SARS-CoV-2 target nucleic acids are NOT DETECTED. The SARS-CoV-2 RNA is generally detectable in upper and lower respiratory specimens during the acute phase of infection. Negative results do not preclude SARS-CoV-2 infection, do not rule out co-infections with other pathogens, and should not be used as the sole basis for treatment or other patient management decisions. Negative results must be combined with clinical observations, patient history, and epidemiological information. The expected result is Negative. Fact Sheet for  Patients: SugarRoll.be Fact Sheet for Healthcare Providers: https://www.woods-mathews.com/ This test is not yet approved or cleared by the [place] FDA and  has been authorized for detection and/or diagnosis of SARS-CoV-2 by FDA under an Emergency Use Authorization (EUA). This EUA will remain  in effect (meaning this test can be used) for the duration of the COVID-19 declaration under Section 56 4(b)(1) of the Act, 21 U.S.C. section 360bbb-3(b)(1), unless the authorization is terminated or revoked sooner. Performed at [facility], [place] [address], [place], [place] [address]      Labs: Basic Metabolic Panel: Recent Labs  Lab 04/29/19 1409 04/29/19 1617 04/29/19 1858 04/30/19 0438 05/01/19 0825  NA 131* 129*  --  133* 135  K 4.1 3.9  --  3.6 3.5  CL 87*  --   --  91* 92*  CO2 31  --   --  32 33*  GLUCOSE 159*  --   --  99 163*  BUN 27*  --   --  23 21  CREATININE 1.07  --   --  1.05 0.96  CALCIUM 8.9  --   --  9.0 9.2  MG  --   --  2.2  --   --   PHOS  --   --  3.6  --   --    Liver Function Tests: No results for input(s): AST, ALT, ALKPHOS, BILITOT, PROT, ALBUMIN in the last 168 hours. No results for input(s): LIPASE, AMYLASE in the last 168 hours. No results for input(s): AMMONIA in the last 168 hours. CBC: Recent Labs  Lab 04/29/19 1409 04/29/19 1617 04/30/19 0438 05/01/19 0825  WBC 10.3  --  8.5 9.1  HGB 8.0* 7.8* 8.0* 7.6*  HCT 25.1* 23.0* 25.4* 24.1*  MCV 88.4  --  88.5 87.0  PLT 349  --  349 334   Cardiac Enzymes: No results for input(s): CKTOTAL, CKMB, CKMBINDEX, TROPONINI in the last 168 hours. BNP: BNP (last 3 results) Recent Labs    02/17/19 1333 03/23/19 1815 04/29/19 1430  BNP 299.4* 275.2* 276.9*    ProBNP (last 3 results) No results for input(s): PROBNP in the last 8760 hours.  CBG: Recent Labs  Lab 04/30/19 1202 04/30/19 1648 04/30/19 2040 05/01/19 0743 05/01/19 1200  GLUCAP  101* 179* 103* 94 131*       Signed:  Domenic Polite MD.  [facility] 05/01/2019, 12:59 PM

## 2019-05-01 NOTE — Progress Notes (Signed)
Physical Therapy Treatment Patient Details Name: [name] MRN: [id number] DOB: 06-22-1931 Today's Date: 05/01/2019    History of Present Illness Pt is an 85 y/o male admitted secondary to AMS. CT of the head was negative for any acute findings. Neurology was consulted and determined pt had acute encephalopathy likely in the setting of hypoxia. Pt was found to be in volume overload and cardiology was consulted. PMH including but not limited to CAD s/p CABG, COPD, a-fib, HTN and chronic indwelling Foley catheter.    PT Comments    Pt seen for mobility progression; however, pt a bit anxious to participate in any OOB activity at this time secondary to currently receiving a blood transfusion. Pt agreeable to LE strengthening therex in bed (see below). Pt would continue to benefit from skilled physical therapy services at this time while admitted and after d/c to address the below listed limitations in order to improve overall safety and independence with functional mobility.    Follow Up Recommendations  Home health PT;Supervision/Assistance - 24 hour     Equipment Recommendations  None recommended by PT    Recommendations for Other Services       Precautions / Restrictions Precautions Precautions: Fall Precaution Comments: monitor O2, necrotic toes Restrictions Weight Bearing Restrictions: No    Mobility  Bed Mobility               General bed mobility comments: pt declining OOB mobility secondary to currently receiving a blood transfusion  Transfers                 General transfer comment: pt declining OOB mobility secondary to currently receiving a blood transfusion  Ambulation/Gait                 Stairs             Wheelchair Mobility    Modified Rankin (Stroke Patients Only)       Balance                                            Cognition Arousal/Alertness: Awake/alert Behavior During Therapy: WFL for  tasks assessed/performed Overall Cognitive Status: Within Functional Limits for tasks assessed                                        Exercises General Exercises - Lower Extremity Ankle Circles/Pumps: AROM;Both;20 reps;Supine Quad Sets: AROM;Strengthening;Both;10 reps;Supine Gluteal Sets: AROM;Strengthening;Both;10 reps;Supine Heel Slides: AROM;Strengthening;Both;10 reps;Supine Hip ABduction/ADduction: AROM;Strengthening;Both;10 reps;Supine Straight Leg Raises: AROM;Strengthening;Both;10 reps;Supine    General Comments        Pertinent Vitals/Pain Pain Assessment: Faces Faces Pain Scale: Hurts little more Pain Location: toes Pain Descriptors / Indicators: Sore Pain Intervention(s): Monitored during session;Repositioned    Home Living                      Prior Function            PT Goals (current goals can now be found in the care plan section) Acute Rehab PT Goals PT Goal Formulation: With patient Time For Goal Achievement: 05/14/19 Potential to Achieve Goals: Good Progress towards PT goals: Progressing toward goals    Frequency    Min 3X/week      PT Plan Current  plan remains appropriate    Co-evaluation              AM-PAC PT "6 Clicks" Mobility   Outcome Measure  Help needed turning from your back to your side while in a flat bed without using bedrails?: None Help needed moving from lying on your back to sitting on the side of a flat bed without using bedrails?: None Help needed moving to and from a bed to a chair (including a wheelchair)?: None Help needed standing up from a chair using your arms (e.g., wheelchair or bedside chair)?: None Help needed to walk in hospital room?: None Help needed climbing 3-5 steps with a railing? : A Little 6 Click Score: 23    End of Session   Activity Tolerance: Patient limited by pain Patient left: in bed;with call bell/phone within reach Nurse Communication: Mobility status PT  Visit Diagnosis: Muscle weakness (generalized) (M62.81)     Time: 3419-6222 PT Time Calculation (min) (ACUTE ONLY): 13 min  Charges:  $Therapeutic Exercise: 8-22 mins                     Anastasio Champion, DPT  Acute Rehabilitation Services Pager [phone] Office [place] 05/01/2019, 4:10 PM

## 2019-05-01 NOTE — Progress Notes (Signed)
   04/30/19 2038  MEWS Score  Resp 18  Pulse Rate (!) 59  BP (!) 137/49  Temp 98.7 F (37.1 C)  SpO2 100 %  O2 Device Nasal Cannula  O2 Flow Rate (L/min) 4 L/min  MEWS Score  MEWS RR 0  MEWS Pulse 0  MEWS Systolic 0  MEWS LOC 0  MEWS Temp 0  MEWS Score 0  MEWS Score Color Green  MEWS Assessment  Is this an acute change? No   Lab Results WBC  Date/Time Value Ref Range Status  04/30/2019 04:38 AM 8.5 4.0 - 10.5 K/uL Final  04/29/2019 02:09 PM 10.3 4.0 - 10.5 K/uL Final  03/24/2019 02:11 AM 7.0 4.0 - 10.5 K/uL Final   Neutrophils  Date/Time Value Ref Range Status  08/08/2017 11:35 AM 67 Not Estab. % Final   Neutrophils Relative %  Date/Time Value Ref Range Status  03/23/2019 06:15 PM 72 % Final  01/19/2019 02:08 PM 69 % Final  01/01/2019 06:05 AM 83 % Final   pCO2 arterial  Date/Time Value Ref Range Status  04/29/2019 04:17 PM 44.7 32.0 - 48.0 mmHg Final  01/01/2019 07:40 AM 31.8 (L) 32.0 - 48.0 mmHg Final  12/17/2018 11:28 AM 48.3 (H) 32.0 - 48.0 mmHg Final   Lactic Acid, Venous  Date/Time Value Ref Range Status  11/16/2018 12:50 AM 1.3 0.5 - 1.9 mmol/L Final    Comment:    Performed at [facility], [place] [address], [place], [place] [address]  11/15/2018 08:00 PM 1.7 0.5 - 1.9 mmol/L Final    Comment:    Performed at [facility] [address], [place], [place] [address]   pCO2, Ven  Date/Time Value Ref Range Status  08/14/2017 09:02 AM 48.6 44.0 - 60.0 mmHg Final

## 2019-05-01 NOTE — Progress Notes (Signed)
OT Cancellation Note  Patient Details Name: [name] MRN: [id number] DOB: April 25, 1932   Cancelled Treatment:    Reason Eval/Treat Not Completed: Other (comment). Pt in middle of eating his breakfast.  [place], OTR/L Acute Rehab Services Pager [phone] Office [phone]     Almon Register 05/01/2019, 8:00 AM

## 2019-05-01 NOTE — TOC Initial Note (Addendum)
Transition of Care [facility]) - Initial/Assessment Note    Patient Details  Name: [name] MRN: [id number] Date of Birth: Nov 26, 1931  Transition of Care [facility]) CM/SW Contact:    Benard Halsted, LCSW Phone Number: 05/01/2019, 10:46 AM  Clinical Narrative:                 Patient resides at home with his spouse. Patient chart reviewed for high hospital readmission score. Patient was previously active with [facility] services. Patient is no longer active with them but can accept patient back if patient requests them. Patient is also active with [facility] ([place]). CSW left them voicemail to make them aware of patient's hospitalization.   CSW spoke with patient's wife. She reported that they do not want any home health services at this time due to patient having several medical procedures lined up in the coming weeks. She reports she is a Marine scientist and is staying on top of patien's care, but did appreciate the care [facility] provided. She states the [facility] nurse checks in on patient and is available if they need anything. She confirmed patient is on home oxygen through [facility]. She denied other needs at this time.   Expected Discharge Plan: [place] Barriers to Discharge: Continued Medical Work up   Patient Goals and CMS Choice Patient states their goals for this hospitalization and ongoing recovery are:: Return home CMS Medicare.gov Compare Post Acute Care list provided to:: Patient Choice offered to / list presented to : Patient  Expected Discharge Plan and Services Expected Discharge Plan: [place] In-house Referral: NA Discharge Planning Services: CM Consult Post Acute Care Choice: Home Health, Resumption of Svcs/PTA Provider Living arrangements for the past 2 months: Single Family Home                 DME Arranged: N/A DME Agency: NA       HH Arranged: NA           Prior Living Arrangements/Services Living arrangements for the past 2 months: Single Family Home Lives with:: Spouse Patient language and need for interpreter reviewed:: Yes Do you feel safe going back to the place where you live?: Yes      Need for Family Participation in Patient Care: No (Comment) Care giver support system in place?: Yes (comment) Current home services: DME, Home PT, Home OT, Other (comment)([facility]) Criminal Activity/Legal Involvement Pertinent to Current Situation/Hospitalization: No - Comment as needed  Activities of Daily Living      Permission Sought/Granted Permission sought to share information with : Facility Sport and exercise psychologist, Family Supports Permission granted to share information with : Yes, Verbal Permission Granted  Share Information with NAME: [name]  Permission granted to share info w AGENCY: [facility], [name]  Permission granted to share info w Relationship: Spouse  Permission granted to share info w Contact Information: [phone]  Emotional Assessment Appearance:: Appears stated age Attitude/Demeanor/Rapport: Engaged Affect (typically observed): Accepting, Appropriate Orientation: : Oriented to Self, Oriented to Place, Oriented to  Time, Oriented to Situation Alcohol / Substance Use: Not Applicable Psych Involvement: No (comment)  Admission diagnosis:  SOB (shortness of breath) [R06.02] Patient Active Problem List   Diagnosis Date Noted  . AMS (altered mental status) 04/29/2019  . Hyperlipidemia   . Acute on chronic diastolic (congestive) heart failure ([place]) 03/23/2019  . Type 2 diabetes mellitus ([place]) 03/23/2019  . Generalized weakness   .  Congestive heart failure (CHF) ([place]) 02/17/2019  . Hypoxemia   . Acute pulmonary edema (HCC)   . Heart failure with preserved ejection fraction ([place]) 01/20/2019  . Acute on chronic diastolic heart failure ([place]) 01/20/2019  . Pyuria 01/20/2019  . Hematuria,  microscopic 01/20/2019  . Presence of indwelling Foley catheter 01/20/2019  . Goals of care, counseling/discussion   . Palliative care encounter   . Severe Pulmonary hypertension ([place]) 01/19/2019  . Chronic hypoxemic respiratory failure ([place]) 01/19/2019  . COPD (chronic obstructive pulmonary disease) ([place]) 01/19/2019  . Hypoalbuminemia 01/19/2019  . Bilateral pleural effusion 01/19/2019  . Left atrial severely dilated 01/19/2019  . Dyspnea 12/31/2018  . Pulmonary embolism and infarction ([place])   . Acute on chronic respiratory failure with hypoxia ([place])   . 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 Mitral stenosis   . Atrial fibrillation ([place])   . Chronic anticoagulation 06/27/2013  . 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 Hypertension 03/10/2009  . Hyperlipidemia associated with type 2 diabetes mellitus ([place]) 09/09/2008  . Anemia 09/09/2008  . RENAL ARTERY STENOSIS- moderate 09/09/2008   PCP:  Nicoletta Dress, MD Pharmacy:   [facility] [place] [address] Phone: [phone] Fax: [place], [place] - [address] [place] [address] Phone: [phone] Fax: [phone]     Social Determinants of Health (SDOH) Interventions    Readmission Risk Interventions Readmission Risk Prevention Plan 05/01/2019 03/26/2019  Transportation Screening Complete Complete  Medication Review ([place]) Complete Complete  PCP or Specialist appointment within 3-5 days of discharge Complete -  [place] or [place] Complete Complete  SW Recovery Care/Counseling Consult Complete Complete  Palliative Care Screening Not Applicable Complete  [place] Not Applicable Not Applicable  Some recent data might be hidden

## 2019-05-01 NOTE — Progress Notes (Signed)
[name] to be D/C'd Home per MD order.  Discussed with the patient and all questions fully answered.  VSS, Skin clean, dry and intact without evidence of skin break down, no evidence of skin tears noted. IV catheter discontinued intact. Site without signs and symptoms of complications. Dressing and pressure applied.  An After Visit Summary was printed and given to the patient.  D/c education completed with patient/family including follow up instructions, medication list, d/c activities limitations if indicated, with other d/c instructions as indicated by MD - patient able to verbalize understanding, all questions fully answered.   Patient instructed to return to ED, call 911, or call MD for any changes in condition.   Patient escorted via [place], and D/C home via private auto.  [name] 05/01/2019 4:46 PM

## 2019-05-01 NOTE — Progress Notes (Signed)
Progress Note  Patient Name: [name] Date of Encounter: 05/01/2019  Primary Cardiologist: Kirk Ruths, MD   Subjective   No CP; No dyspnea  Inpatient Medications    Scheduled Meds: . apixaban  5 mg Oral BID  . atorvastatin  80 mg Oral q1800  . Chlorhexidine Gluconate Cloth  6 each Topical Daily  . digoxin  0.125 mg Oral Daily  . insulin aspart  0-15 Units Subcutaneous TID WC  . insulin aspart  0-5 Units Subcutaneous QHS  . levalbuterol  0.63 mg Nebulization TID  . mouth rinse  15 mL Mouth Rinse BID  . metoprolol tartrate  25 mg Oral BID  . mometasone-formoterol  2 puff Inhalation BID  . polyethylene glycol  17 g Oral Daily  . spironolactone  25 mg Oral Daily  . umeclidinium bromide  1 puff Inhalation Daily   Continuous Infusions: . ferumoxytol     PRN Meds: acetaminophen **OR** acetaminophen, ondansetron **OR** ondansetron (ZOFRAN) IV   Vital Signs    Vitals:   04/30/19 2038 04/30/19 2215 05/01/19 0537 05/01/19 0800  BP: (!) 137/49  (!) 118/50 (!) 117/57  Pulse: (!) 59  (!) 53 66  Resp: 18  16 18   Temp: 98.7 F (37.1 C)  98.6 F (37 C) 98.1 F (36.7 C)  TempSrc:    Oral  SpO2: 100% 99% 97% 100%  Weight:      Height:        Intake/Output Summary (Last 24 hours) at 05/01/2019 0841 Last data filed at 05/01/2019 0015 Gross per 24 hour  Intake -  Output 1000 ml  Net -1000 ml   Last 3 Weights 04/30/2019 04/29/2019 04/09/2019  Weight (lbs) 165 lb 12.6 oz 164 lb 165 lb  Weight (kg) 75.2 kg 74.39 kg 74.844 kg      Telemetry    Atrial fibrillation with PVCs or aberrantly conducted beats, rate controlled- Personally Reviewed  Physical Exam   GEN: No acute distress.   Neck: No JVD Cardiac: irregular Respiratory: Diminished BS bases GI: Soft, nontender, non-distended  MS: No edema; necrotic digits lower ext Neuro:  Nonfocal  Psych: Normal affect   Labs    High Sensitivity Troponin:   Recent Labs  Lab 04/29/19 1409 04/29/19 1619   TROPONINIHS 40* 36*      Chemistry Recent Labs  Lab 04/29/19 1409 04/29/19 1617 04/30/19 0438  NA 131* 129* 133*  K 4.1 3.9 3.6  CL 87*  --  91*  CO2 31  --  32  GLUCOSE 159*  --  99  BUN 27*  --  23  CREATININE 1.07  --  1.05  CALCIUM 8.9  --  9.0  GFRNONAA >60  --  >60  GFRAA >60  --  >60  ANIONGAP 13  --  10     Hematology Recent Labs  Lab 04/29/19 1409 04/29/19 1617 04/30/19 0438  WBC 10.3  --  8.5  RBC 2.84*  --  2.87*  HGB 8.0* 7.8* 8.0*  HCT 25.1* 23.0* 25.4*  MCV 88.4  --  88.5  MCH 28.2  --  27.9  MCHC 31.9  --  31.5  RDW 17.3*  --  17.3*  PLT 349  --  349    BNP Recent Labs  Lab 04/29/19 1430  BNP 276.9*     Radiology    Dg Chest 2 View  Result Date: 04/29/2019 CLINICAL DATA:  Shortness of breath. EXAM: CHEST - 2 VIEW COMPARISON:  03/25/2019. FINDINGS:  Prior CABG. Prior cardiac valve replacement. Cardiomegaly with pulmonary vascular prominence bilateral interstitial prominence and bilateral pleural effusions. Findings have progressed from prior exam. Findings are consistent with CHF. No pneumothorax. No acute bony abnormality. IMPRESSION: Prior CABG. Prior cardiac valve replacement. Cardiomegaly with diffuse bilateral from interstitial prominence and bilateral pleural effusions. Findings consistent with CHF. Findings have progressed from prior study of 03/25/2019. Electronically Signed   By: Marcello Moores  Register   On: 04/29/2019 14:50   Ct Head Wo Contrast  Result Date: 04/29/2019 CLINICAL DATA:  Altered mental status. EXAM: CT HEAD WITHOUT CONTRAST TECHNIQUE: Contiguous axial images were obtained from the base of the skull through the vertex without intravenous contrast. COMPARISON:  None. FINDINGS: Brain: No intracranial hemorrhage, mass effect, or midline shift. Generalized atrophy, normal for age. Mild chronic small vessel ischemia. No hydrocephalus. The basilar cisterns are patent. No evidence of territorial infarct or acute ischemia. No extra-axial  or intracranial fluid collection. Vascular: Atherosclerosis of skullbase vasculature without hyperdense vessel or abnormal calcification. Skull: No fracture or focal lesion. Sinuses/Orbits: Scattered mucosal thickening of ethmoid air cells. No sinus fluid level. Slight rightward nasal septal deviation. Bilateral cataract resection. The mastoid air cells are clear. Other: None. IMPRESSION: 1. No acute intracranial abnormality. 2. Age related atrophy and chronic small vessel ischemia. Electronically Signed   By: Keith Rake M.D.   On: 04/29/2019 17:27    Patient Profile     85 yo male history of CAD with prior CABG, permanent afib, history of AS now s/p TAVR, mitral stenosis, chronic pleural effusions with multiple prior thoracentesis, COPD on home O2, DNR/DNI status.Presents with near syncopal episode after being off his home O2 and becoming hypoxia. Evidence of volume overload on presentation, cardiology consulted to help manage  Assessment & Plan    1 acute on chronic diastolic congestive heart failure-I/O-1000; weight 75.2 kg; patient not markedly volume overloaded on examination.  He feels as though his home diuretic regimen was working well.  We will continue with IV Lasix while he is in-house.  Continue spironolactone.  He would like to be discharged this evening which would be okay from a cardiac standpoint.  At discharge would treat with Demadex 40 mg twice daily.  Weigh daily and increase to 60 mg twice daily for weight gain of 2 to 3 pounds.  Would continue fluid restriction and low-sodium diet.  2 permanent atrial fibrillation-continue metoprolol and digoxin for rate control.  Continue apixaban.  3 coronary artery disease status post coronary artery bypass graft-no aspirin given need for apixaban.  Continue statin.  4 status post TAVR  5 mitral stenosis-moderate to severe on most recent echocardiogram.  Not a candidate for surgery.  6 necrotic digits-he will need follow-up with  vascular surgery.  For questions or updates, please contact [place] Please consult [email or web address] for contact info under        Signed, Kirk Ruths, MD  05/01/2019, 8:41 AM

## 2019-05-01 NOTE — Progress Notes (Signed)
Carotid doppler has been completed results are located under CV Proc.  Darlina Sicilian RDCS

## 2019-05-01 NOTE — Evaluation (Signed)
Clinical/Bedside Swallow Evaluation Patient Details  Name: [name] MRN: [id number] Date of Birth: 1932/01/12  Today's Date: 05/01/2019 Time: SLP Start Time (ACUTE ONLY): 1351 SLP Stop Time (ACUTE ONLY): 1403 SLP Time Calculation (min) (ACUTE ONLY): 12 min  Past Medical History:  Past Medical History:  Diagnosis Date  . Acute myocardial infarction, unspecified site, initial episode of care   . Anemia   . Anxiety   . Aortic stenosis    . Atrial fibrillation 06/27/2013  . Atrial fibrillation ([place])    a. permanent, on Coumadin for anticoagulation  . BPPV (benign paroxysmal positional vertigo) 2015  . CAD (coronary artery disease)    a. s/p CABG in 1994 b. cath in 2015 showing normal LM, 100% LCx and RCA stenosis with 50-60% prox LAD stenosis and 100% mid-LAD stenosis; patent sequential SVG-OM2-OM3 and patent LIMA-LAD with PTCA of distal LAD via LIMA graft performed at that time  . Carotid stenosis   . CAROTID STENOSIS- moderate 09/09/2008   Qualifier: Diagnosis of  By: Burnett Kanaris    . Cellulitis 11/15/2018  . Chest pain with moderate risk of acute coronary syndrome 06/27/2013  . CHF (congestive heart failure) ([place])   . Chronic anticoagulation 06/27/2013  . COPD (chronic obstructive pulmonary disease) ([place])   . Depression   . Diabetes mellitus without complication ([place])    FASTING 98-120S  . GERD (gastroesophageal reflux disease)   . Gout attack- on steroid dose pack 06/27/2013  . History of blood transfusion   . History of peptic ulcer disease   . Hyperlipidemia   . Hypertension   . Left atrial severely dilated 01/19/2019  . Myocardial infarction ([place])    X2  . NSTEMI (non-ST elevated myocardial infarction) ([place]) 08/2013  . 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 PEPTIC ULCER DISEASE, HX OF 09/09/2008   Qualifier: Diagnosis of  By: Burnett Kanaris    . PERIPHERAL VASCULAR DISEASE 09/09/2008   Qualifier:  Diagnosis of  By: Burnett Kanaris    . Peripheral vascular disease ([place])   . Pneumonia    HX OF  . Presence of indwelling Foley catheter 01/20/2019  . Pressure injury of skin 12/17/2018  . Renal artery stenosis ([place])   . S/P thoracentesis   . Severe aortic stenosis 09/17/2017  . Sleep apnea    OXYGEN AT NIGHT 2L [place]  . Stress fracture of calcaneus 2018   Dr Paulla Dolly [facility])  . Tachycardia- beta blocker increased 06/26/2013  . UNSPECIFIED ANEMIA 09/09/2008   Qualifier: Diagnosis of  By: Burnett Kanaris    . UNSPECIFIED CEREBROVASCULAR DISEASE 09/09/2008   Qualifier: Diagnosis of  By: Burnett Kanaris    . Unstable angina ([place]) 08/31/2013  . Vertigo 06/27/2013   Past Surgical History:  Past Surgical History:  Procedure Laterality Date  . AORTIC ARCH ANGIOGRAPHY N/A 08/14/2017   Procedure: AORTIC ARCH ANGIOGRAPHY;  Surgeon: Sherren Mocha, MD;  Location: [place] CV LAB;  Service: Cardiovascular;  Laterality: N/A;  . BOWEL RESECTION  07/2013   Bowel perforation  . CARDIAC CATHETERIZATION    . CAROTID ENDARTERECTOMY Right 2014   Curt Jews, MD  . COLON SURGERY    . CORONARY ARTERY BYPASS GRAFT  1994  . ENDARTERECTOMY Right 01/05/2013   Procedure: ENDARTERECTOMY CAROTID;  Surgeon: Rosetta Posner, MD;  Location: [place];  Service: Vascular;  Laterality: Right;  . INCISION AND DRAINAGE Left 11/16/2018   Procedure: INCISION AND DRAINAGE LEFT FOREARM/ARM;  Surgeon: Fredna Dow,  Lennette Bihari, MD;  Location: [place];  Service: Orthopedics;  Laterality: Left;  . KNEE SURGERY  08/2003   left knee/ARTHROSCOPIC  . LEFT HEART CATHETERIZATION WITH CORONARY/GRAFT ANGIOGRAM N/A 09/01/2013   Procedure: LEFT HEART CATHETERIZATION WITH Beatrix Fetters;  Surgeon: Sinclair Grooms, MD;  Location: [facility] CATH LAB;  Service: Cardiovascular;  Laterality: N/A;  . PATCH ANGIOPLASTY Right 01/05/2013   Procedure: PATCH ANGIOPLASTY;  Surgeon: Rosetta Posner, MD;  Location: [place];  Service: Vascular;  Laterality: Right;  .  PERCUTANEOUS CORONARY STENT INTERVENTION (PCI-S)  09/01/2013   Procedure: PERCUTANEOUS CORONARY STENT INTERVENTION (PCI-S);  Surgeon: Sinclair Grooms, MD;  Location: [facility] CATH LAB;  Service: Cardiovascular;;  . removal of bleeding ulcer  1994  . RENAL ARTERY STENT     stenting of the left renal artery as well as a cutting balloon angioplasty for treatment of in-stent restenosis coronary artery bypass grafting in 1994.  [name] RIGHT/LEFT HEART CATH AND CORONARY/GRAFT ANGIOGRAPHY N/A 08/14/2017   Procedure: RIGHT/LEFT HEART CATH AND CORONARY/GRAFT ANGIOGRAPHY;  Surgeon: Sherren Mocha, MD;  Location: [place] CV LAB;  Service: Cardiovascular;  Laterality: N/A;  . TEE WITHOUT CARDIOVERSION N/A 09/17/2017   Procedure: TRANSESOPHAGEAL ECHOCARDIOGRAM (TEE);  Surgeon: Sherren Mocha, MD;  Location: [place];  Service: Open Heart Surgery;  Laterality: N/A;   HPI:  85 y.o. male with medical history significant of coronary artery disease status post CABG, chronic diastolic congestive heart failure, A. fib on Eliquis, aortic stenosis status post TAVR, severe pulmonary hypertension, COPD on 3 L of oxygen at home, type 2 diabetes mellitus, hypertension, hyperlipidemia, anemia, urinary retention with chronic indwelling Foley catheter, obstructive sleep apnea, numerous frequent hospitalizations with CHF was brought to the ED due to altered level of consciousness   Assessment / Plan / Recommendation Clinical Impression  Pt presents with normal oropharyngeal swallow with active mastication, brisk swallow response, no s/s of aspiration with large, successive sips of thin liquid.  RR and swallowing demonstrate adequate reciprocity.  Voice is clear; no coughing nor other s/s of aspiration. No dysphagia identified.  Continue regular diet thin liquids.  SLP will sign off.  SLP Visit Diagnosis: Dysphagia, unspecified (R13.10)    Aspiration Risk  No limitations    Diet Recommendation   regular, thin liquids  Medication  Administration: Whole meds with liquid    Other  Recommendations     Follow up Recommendations None      Frequency and Duration            Prognosis        Swallow Study   General HPI: 85 y.o. male with medical history significant of coronary artery disease status post CABG, chronic diastolic congestive heart failure, A. fib on Eliquis, aortic stenosis status post TAVR, severe pulmonary hypertension, COPD on 3 L of oxygen at home, type 2 diabetes mellitus, hypertension, hyperlipidemia, anemia, urinary retention with chronic indwelling Foley catheter, obstructive sleep apnea, numerous frequent hospitalizations with CHF was brought to the ED due to altered level of consciousness Type of Study: Bedside Swallow Evaluation Previous Swallow Assessment: no Diet Prior to this Study: Regular;Thin liquids Temperature Spikes Noted: No Respiratory Status: Nasal cannula History of Recent Intubation: No Behavior/Cognition: Alert;Cooperative;Pleasant mood Oral Cavity Assessment: Within Functional Limits Oral Care Completed by SLP: No Oral Cavity - Dentition: Adequate natural dentition Vision: Functional for self-feeding Self-Feeding Abilities: Able to feed self Patient Positioning: Upright in bed Baseline Vocal Quality: Normal Volitional Cough: Strong Volitional Swallow: Able to elicit  Oral/Motor/Sensory Function Overall Oral Motor/Sensory Function: Within functional limits   Ice Chips Ice chips: Within functional limits   Thin Liquid Thin Liquid: Within functional limits    Nectar Thick Nectar Thick Liquid: Not tested   Honey Thick Honey Thick Liquid: Not tested   Puree Puree: Within functional limits   Solid     Solid: Within functional limits      Juan Quam Laurice 05/01/2019,2:03 PM   Estill Bamberg L. Tivis Ringer, [place] Office number [phone] Pager [phone]

## 2019-05-01 NOTE — Plan of Care (Signed)

## 2019-05-01 NOTE — Evaluation (Signed)
Occupational Therapy Evaluation and Discharge Patient Details Name: [name] MRN: [id number] DOB: [birth year] Today's Date: 05/01/2019    History of Present Illness 85 y.o. male with medical history significant of coronary artery disease status post CABG, chronic diastolic congestive heart failure, A. fib on Eliquis, aortic stenosis status post TAVR, severe pulmonary hypertension, COPD on 3 L of oxygen at home, type 2 diabetes mellitus, hypertension, hyperlipidemia, anemia, urinary retention with chronic indwelling Foley catheter, obstructive sleep apnea, numerous frequent hospitalizations with CHF was brought to the ED due to altered level of consciousness   Clinical Impression   This 85 yo male admitted with above presents to acute OT at his baseline for basic ADLs (wife A prn), showing safety with RW use and transfers. He reports he feels his indwelling catheter needs to be changed (made RN aware a lot of sediment and draining to gravity properly). [name] sats on 3 liters was 100% before we left the room and 100% when we got back to room with pt doing purse lipped breathing throughout session without cues. No further OT needs identified, we will sign off.    Follow Up Recommendations  No OT follow up;Supervision/Assistance - 24 hour    Equipment Recommendations  None recommended by OT       Precautions / Restrictions Precautions Precautions: Fall Precaution Comments: monitor O2, necrotic toes Restrictions Weight Bearing Restrictions: No      Mobility Bed Mobility Overal bed mobility: Modified Independent Bed Mobility: Supine to Sit;Sit to Supine     Supine to sit: HOB elevated;Modified independent (Device/Increase time) Sit to supine: Modified independent (Device/Increase time);HOB elevated      Transfers Overall transfer level: Needs assistance Equipment used: Rolling walker (2 wheeled) Transfers: Sit to/from Stand Sit to Stand: Supervision               Balance Overall balance assessment: Needs assistance Sitting-balance support: No upper extremity supported;Feet supported Sitting balance-Leahy Scale: Good     Standing balance support: No upper extremity supported Standing balance-Leahy Scale: Fair Standing balance comment: static standing at EOB                           ADL either performed or assessed with clinical judgement   ADL Overall ADL's : At baseline                                       General ADL Comments: Wife A him with any basic ADLs he needs help with and will continue to do so post D/C per patient     Vision Patient Visual Report: No change from baseline              Pertinent Vitals/Pain Pain Assessment: Faces Pain Score: 4  Pain Location: toes Pain Descriptors / Indicators: Sore Pain Intervention(s): Limited activity within patient's tolerance;Repositioned     Hand Dominance Right   Extremity/Trunk Assessment Upper Extremity Assessment Upper Extremity Assessment: Overall WFL for tasks assessed           Communication Communication Communication: HOH   Cognition Arousal/Alertness: Awake/alert Behavior During Therapy: WFL for tasks assessed/performed Overall Cognitive Status: Within Functional Limits for tasks assessed  Home Living Family/patient expects to be discharged to:: Private residence Living Arrangements: Spouse/significant other Available Help at Discharge: Family;Available 24 hours/day Type of Home: House Home Access: Stairs to enter [employer] of Steps: 4 Entrance Stairs-Rails: Right;Left Home Layout: One level     Bathroom Shower/Tub: Occupational psychologist: Standard Bathroom Accessibility: Yes   Home Equipment: Cane - single point;Walker - 2 wheels;Shower seat;Bedside commode;Crutches   Additional Comments: recently at [facility] 01/2019      Prior  Functioning/Environment Level of Independence: Needs assistance  Gait / Transfers Assistance Needed: Was using RW for mobility tasks.  ADL's / Homemaking Assistance Needed: Pt reports wife assists him with all ADLs   Comments: wife former Therapist, sports, in good health        OT Problem List: Impaired balance (sitting and/or standing);Pain         OT Goals(Current goals can be found in the care plan section) Acute Rehab OT Goals Patient Stated Goal: to go home today  OT Frequency:                AM-PAC OT "6 Clicks" Daily Activity     Outcome Measure Help from another person eating meals?: None Help from another person taking care of personal grooming?: A Little Help from another person toileting, which includes using toliet, bedpan, or urinal?: A Little Help from another person bathing (including washing, rinsing, drying)?: A Little Help from another person to put on and taking off regular upper body clothing?: A Little Help from another person to put on and taking off regular lower body clothing?: A Little 6 Click Score: 19   End of Session Equipment Utilized During Treatment: Gait belt;Rolling walker;Oxygen(3 liters)  Activity Tolerance: Patient tolerated treatment well Patient left: in bed;with call bell/phone within reach;with bed alarm set(3 liters of O2)  OT Visit Diagnosis: Unsteadiness on feet (R26.81);Pain Pain - Right/Left: (both) Pain - part of body: (feet-toes)                Time: 0981-1914 OT Time Calculation (min): 37 min Charges:  OT General Charges $OT Visit: 1 Visit OT Treatments $Self Care/Home Management : 8-22 mins  [place]en Circle, OTR/L Acute [employer] Pager [phone] Office [phone]     Almon Register 05/01/2019, 10:46 AM

## 2019-05-02 LAB — BPAM RBC
Blood Product Expiration Date: 202012282359
ISSUE DATE / TIME: 202011271221
Unit Type and Rh: 5100

## 2019-05-02 LAB — TYPE AND SCREEN
ABO/RH(D): O POS
Antibody Screen: NEGATIVE
Unit division: 0

## 2019-05-04 ENCOUNTER — Telehealth (HOSPITAL_COMMUNITY): Payer: Self-pay

## 2019-05-04 DIAGNOSIS — S31601A Unspecified open wound of abdominal wall, left upper quadrant with penetration into peritoneal cavity, initial encounter: Secondary | ICD-10-CM | POA: Diagnosis not present

## 2019-05-04 DIAGNOSIS — I96 Gangrene, not elsewhere classified: Secondary | ICD-10-CM | POA: Diagnosis not present

## 2019-05-04 DIAGNOSIS — E1152 Type 2 diabetes mellitus with diabetic peripheral angiopathy with gangrene: Secondary | ICD-10-CM | POA: Diagnosis not present

## 2019-05-04 DIAGNOSIS — S91102A Unspecified open wound of left great toe without damage to nail, initial encounter: Secondary | ICD-10-CM | POA: Diagnosis not present

## 2019-05-04 DIAGNOSIS — L89612 Pressure ulcer of right heel, stage 2: Secondary | ICD-10-CM | POA: Diagnosis not present

## 2019-05-04 NOTE — Telephone Encounter (Signed)
Called to give pt's wife instructions, no answer, left vm. AW

## 2019-05-05 DIAGNOSIS — N390 Urinary tract infection, site not specified: Secondary | ICD-10-CM | POA: Diagnosis not present

## 2019-05-05 DIAGNOSIS — Z436 Encounter for attention to other artificial openings of urinary tract: Secondary | ICD-10-CM | POA: Diagnosis not present

## 2019-05-08 ENCOUNTER — Other Ambulatory Visit: Payer: Self-pay | Admitting: Radiology

## 2019-05-08 DIAGNOSIS — J449 Chronic obstructive pulmonary disease, unspecified: Secondary | ICD-10-CM | POA: Diagnosis not present

## 2019-05-08 DIAGNOSIS — I272 Pulmonary hypertension, unspecified: Secondary | ICD-10-CM | POA: Diagnosis not present

## 2019-05-08 DIAGNOSIS — I5033 Acute on chronic diastolic (congestive) heart failure: Secondary | ICD-10-CM | POA: Diagnosis not present

## 2019-05-08 DIAGNOSIS — R404 Transient alteration of awareness: Secondary | ICD-10-CM | POA: Diagnosis not present

## 2019-05-11 ENCOUNTER — Other Ambulatory Visit (HOSPITAL_COMMUNITY): Payer: Self-pay | Admitting: Interventional Radiology

## 2019-05-11 ENCOUNTER — Encounter (HOSPITAL_COMMUNITY): Payer: Self-pay | Admitting: Interventional Radiology

## 2019-05-11 ENCOUNTER — Ambulatory Visit (HOSPITAL_COMMUNITY)
Admission: RE | Admit: 2019-05-11 | Discharge: 2019-05-11 | Disposition: A | Discharge status: Home / Self Care | Payer: Medicare Other | Source: Ambulatory Visit | Attending: Interventional Radiology | Admitting: Interventional Radiology

## 2019-05-11 ENCOUNTER — Other Ambulatory Visit: Payer: Self-pay

## 2019-05-11 DIAGNOSIS — M109 Gout, unspecified: Secondary | ICD-10-CM | POA: Insufficient documentation

## 2019-05-11 DIAGNOSIS — I509 Heart failure, unspecified: Secondary | ICD-10-CM | POA: Diagnosis not present

## 2019-05-11 DIAGNOSIS — F419 Anxiety disorder, unspecified: Secondary | ICD-10-CM | POA: Diagnosis not present

## 2019-05-11 DIAGNOSIS — I110 Hypertensive heart disease with heart failure: Secondary | ICD-10-CM | POA: Diagnosis not present

## 2019-05-11 DIAGNOSIS — J449 Chronic obstructive pulmonary disease, unspecified: Secondary | ICD-10-CM | POA: Insufficient documentation

## 2019-05-11 DIAGNOSIS — Z79899 Other long term (current) drug therapy: Secondary | ICD-10-CM | POA: Insufficient documentation

## 2019-05-11 DIAGNOSIS — F329 Major depressive disorder, single episode, unspecified: Secondary | ICD-10-CM | POA: Insufficient documentation

## 2019-05-11 DIAGNOSIS — I998 Other disorder of circulatory system: Secondary | ICD-10-CM

## 2019-05-11 DIAGNOSIS — Z9981 Dependence on supplemental oxygen: Secondary | ICD-10-CM | POA: Diagnosis not present

## 2019-05-11 DIAGNOSIS — E785 Hyperlipidemia, unspecified: Secondary | ICD-10-CM | POA: Diagnosis not present

## 2019-05-11 DIAGNOSIS — Z87891 Personal history of nicotine dependence: Secondary | ICD-10-CM | POA: Diagnosis not present

## 2019-05-11 DIAGNOSIS — I6529 Occlusion and stenosis of unspecified carotid artery: Secondary | ICD-10-CM | POA: Diagnosis not present

## 2019-05-11 DIAGNOSIS — I252 Old myocardial infarction: Secondary | ICD-10-CM | POA: Insufficient documentation

## 2019-05-11 DIAGNOSIS — D649 Anemia, unspecified: Secondary | ICD-10-CM | POA: Diagnosis not present

## 2019-05-11 DIAGNOSIS — I350 Nonrheumatic aortic (valve) stenosis: Secondary | ICD-10-CM | POA: Diagnosis not present

## 2019-05-11 DIAGNOSIS — I2510 Atherosclerotic heart disease of native coronary artery without angina pectoris: Secondary | ICD-10-CM | POA: Diagnosis not present

## 2019-05-11 DIAGNOSIS — I70229 Atherosclerosis of native arteries of extremities with rest pain, unspecified extremity: Secondary | ICD-10-CM

## 2019-05-11 DIAGNOSIS — K219 Gastro-esophageal reflux disease without esophagitis: Secondary | ICD-10-CM | POA: Insufficient documentation

## 2019-05-11 DIAGNOSIS — I4819 Other persistent atrial fibrillation: Secondary | ICD-10-CM | POA: Diagnosis not present

## 2019-05-11 DIAGNOSIS — E1151 Type 2 diabetes mellitus with diabetic peripheral angiopathy without gangrene: Secondary | ICD-10-CM | POA: Insufficient documentation

## 2019-05-11 DIAGNOSIS — Z7901 Long term (current) use of anticoagulants: Secondary | ICD-10-CM | POA: Insufficient documentation

## 2019-05-11 DIAGNOSIS — I272 Pulmonary hypertension, unspecified: Secondary | ICD-10-CM | POA: Insufficient documentation

## 2019-05-11 DIAGNOSIS — I745 Embolism and thrombosis of iliac artery: Secondary | ICD-10-CM | POA: Diagnosis not present

## 2019-05-11 DIAGNOSIS — Z951 Presence of aortocoronary bypass graft: Secondary | ICD-10-CM | POA: Insufficient documentation

## 2019-05-11 DIAGNOSIS — Z794 Long term (current) use of insulin: Secondary | ICD-10-CM | POA: Insufficient documentation

## 2019-05-11 HISTORY — PX: IR ILIAC ART STENT INC PTA MOD SED: IMG2306

## 2019-05-11 HISTORY — PX: IR ANGIOGRAM EXTREMITY BILATERAL: IMG653

## 2019-05-11 HISTORY — PX: IR US GUIDE VASC ACCESS RIGHT: IMG2390

## 2019-05-11 HISTORY — PX: IR US GUIDE VASC ACCESS LEFT: IMG2389

## 2019-05-11 LAB — CBC
HCT: 31.8 % — ABNORMAL LOW (ref 39.0–52.0)
Hemoglobin: 9.8 g/dL — ABNORMAL LOW (ref 13.0–17.0)
MCH: 28.1 pg (ref 26.0–34.0)
MCHC: 30.8 g/dL (ref 30.0–36.0)
MCV: 91.1 fL (ref 80.0–100.0)
Platelets: 375 10*3/uL (ref 150–400)
RBC: 3.49 MIL/uL — ABNORMAL LOW (ref 4.22–5.81)
RDW: 20.6 % — ABNORMAL HIGH (ref 11.5–15.5)
WBC: 10 10*3/uL (ref 4.0–10.5)
nRBC: 0 % (ref 0.0–0.2)

## 2019-05-11 LAB — BASIC METABOLIC PANEL
Anion gap: 12 (ref 5–15)
BUN: 19 mg/dL (ref 8–23)
CO2: 34 mmol/L — ABNORMAL HIGH (ref 22–32)
Calcium: 9.6 mg/dL (ref 8.9–10.3)
Chloride: 90 mmol/L — ABNORMAL LOW (ref 98–111)
Creatinine, Ser: 0.94 mg/dL (ref 0.61–1.24)
GFR calc Af Amer: >60 mL/min (ref >60)
GFR calc non Af Amer: >60 mL/min (ref >60)
Glucose, Bld: 136 mg/dL — ABNORMAL HIGH (ref 70–99)
Potassium: 3.9 mmol/L (ref 3.5–5.1)
Sodium: 136 mmol/L (ref 135–145)

## 2019-05-11 LAB — GLUCOSE, CAPILLARY: Glucose-Capillary: 105 mg/dL — ABNORMAL HIGH (ref 70–99)

## 2019-05-11 LAB — PROTIME-INR
INR: 1.2 (ref 0.8–1.2)
Prothrombin Time: 14.7 seconds (ref 11.4–15.2)

## 2019-05-11 MED ORDER — HEPARIN SODIUM (PORCINE) 1000 UNIT/ML IJ SOLN
INTRAMUSCULAR | Status: AC | PRN
  Administered 2019-05-11: 4000 [IU] via INTRAVENOUS

## 2019-05-11 MED ORDER — SODIUM CHLORIDE 0.9 % IV SOLN: INTRAVENOUS | Status: DC

## 2019-05-11 MED ORDER — SODIUM CHLORIDE 0.9 % IV SOLN
INTRAVENOUS | Status: AC | PRN
  Administered 2019-05-11: 10 mL/h via INTRAVENOUS

## 2019-05-11 MED ORDER — CEFAZOLIN SODIUM-DEXTROSE 2-4 GM/100ML-% IV SOLN
INTRAVENOUS | Status: AC
  Filled 2019-05-11: qty 100

## 2019-05-11 MED ORDER — IODIXANOL 320 MG/ML IV SOLN
100.0000 mL | Freq: Once | INTRAVENOUS | Status: AC | PRN
  Administered 2019-05-11: 30 mL via INTRA_ARTERIAL

## 2019-05-11 MED ORDER — HEPARIN SODIUM (PORCINE) 1000 UNIT/ML IJ SOLN
INTRAMUSCULAR | Status: AC
  Filled 2019-05-11: qty 1

## 2019-05-11 MED ORDER — FENTANYL CITRATE (PF) 100 MCG/2ML IJ SOLN
INTRAMUSCULAR | Status: AC | PRN
  Administered 2019-05-11 (×2): 25 ug via INTRAVENOUS

## 2019-05-11 MED ORDER — LIDOCAINE HCL 1 % IJ SOLN
INTRAMUSCULAR | Status: AC
  Filled 2019-05-11: qty 20

## 2019-05-11 MED ORDER — FENTANYL CITRATE (PF) 100 MCG/2ML IJ SOLN
INTRAMUSCULAR | Status: AC
  Filled 2019-05-11: qty 2

## 2019-05-11 MED ORDER — MIDAZOLAM HCL 2 MG/2ML IJ SOLN
INTRAMUSCULAR | Status: AC
  Filled 2019-05-11: qty 2

## 2019-05-11 MED ORDER — CEFAZOLIN SODIUM-DEXTROSE 2-4 GM/100ML-% IV SOLN
2.0000 g | INTRAVENOUS | Status: AC
  Administered 2019-05-11: 09:00:00 2 g via INTRAVENOUS

## 2019-05-11 MED ORDER — MIDAZOLAM HCL 2 MG/2ML IJ SOLN
INTRAMUSCULAR | Status: AC | PRN
  Administered 2019-05-11: 0.5 mg via INTRAVENOUS

## 2019-05-11 MED ORDER — ASPIRIN 325 MG PO TABS: 650.0000 mg | ORAL_TABLET | Freq: Once | ORAL | Status: DC

## 2019-05-11 MED ORDER — ASPIRIN EC 325 MG PO TBEC
DELAYED_RELEASE_TABLET | ORAL | Status: AC
  Administered 2019-05-11: 650 mg
  Filled 2019-05-11: qty 2

## 2019-05-11 NOTE — Sedation Documentation (Signed)
6 Fr. Angioseal to left groin

## 2019-05-11 NOTE — Sedation Documentation (Addendum)
8 Fr. Angioseal to right groin

## 2019-05-11 NOTE — Progress Notes (Signed)
Ambulated in room tol well no bleeding noted from either groin site.

## 2019-05-11 NOTE — Discharge Instructions (Addendum)
Angiogram, Care After This sheet gives you information about how to care for yourself after your procedure. Your health care provider may also give you more specific instructions. If you have problems or questions, contact your health care provider. What can I expect after the procedure? After the procedure, it is common to have bruising and tenderness at the catheter insertion area. Follow these instructions at home: Insertion site care  Follow instructions from your health care provider about how to take care of your insertion site. Make sure you: ? Wash your hands with soap and water before you change your bandage (dressing). If soap and water are not available, use hand sanitizer. ? Change your dressing as told by your health care provider. ? Leave stitches (sutures), skin glue, or adhesive strips in place. These skin closures may need to stay in place for 2 weeks or longer. If adhesive strip edges start to loosen and curl up, you may trim the loose edges. Do not remove adhesive strips completely unless your health care provider tells you to do that.  Do not take baths, swim, or use a hot tub until your health care provider approves.  You may shower 24-48 hours after the procedure or as told by your health care provider. ? Gently wash the site with plain soap and water. ? Pat the area dry with a clean towel. ? Do not rub the site. This may cause bleeding.  Do not apply powder or lotion to the site. Keep the site clean and dry.  Check your insertion site every day for signs of infection. Check for: ? Redness, swelling, or pain. ? Fluid or blood. ? Warmth. ? Pus or a bad smell. Activity  Rest as told by your health care provider, usually for 1-2 days.  Do not lift anything that is heavier than 10 lbs. (4.5 kg) or as told by your health care provider.  Do not drive for 24 hours if you were given a medicine to help you relax (sedative).  Do not drive or use heavy machinery while  taking prescription pain medicine. General instructions   Return to your normal activities as told by your health care provider, usually in about a week. Ask your health care provider what activities are safe for you.  If the catheter site starts bleeding, lie flat and put pressure on the site. If the bleeding does not stop, get help right away. This is a medical emergency.  Drink enough fluid to keep your urine clear or pale yellow. This helps flush the contrast dye from your body.  Take over-the-counter and prescription medicines only as told by your health care provider.  Keep all follow-up visits as told by your health care provider. This is important. Contact a health care provider if:  You have a fever or chills.  You have redness, swelling, or pain around your insertion site.  You have fluid or blood coming from your insertion site.  The insertion site feels warm to the touch.  You have pus or a bad smell coming from your insertion site.  You have bruising around the insertion site.  You notice blood collecting in the tissue around the catheter site (hematoma). The hematoma may be painful to the touch. Get help right away if:  You have severe pain at the catheter insertion area.  The catheter insertion area swells very fast.  The catheter insertion area is bleeding, and the bleeding does not stop when you hold steady pressure on the area.  The area near or just beyond the catheter insertion site becomes pale, cool, tingly, or numb. These symptoms may represent a serious problem that is an emergency. Do not wait to see if the symptoms will go away. Get medical help right away. Call your local emergency services (911 in the [place] Do not drive yourself to the hospital. Summary  After the procedure, it is common to have bruising and tenderness at the catheter insertion area.  After the procedure, it is important to rest and drink plenty of fluids.  Do not take baths,  swim, or use a hot tub until your health care provider says it is okay to do so. You may shower 24-48 hours after the procedure or as told by your health care provider.  If the catheter site starts bleeding, lie flat and put pressure on the site. If the bleeding does not stop, get help right away. This is a medical emergency. This information is not intended to replace advice given to you by your health care provider. Make sure you discuss any questions you have with your health care provider. Document Released: 12/07/2004 Document Revised: 05/03/2017 Document Reviewed: 04/25/2016 Elsevier Patient Education  2020 [employer].   [facility]  Discharge Instructions  Lower Extremity Angiogram; Angioplasty/Stenting  Please refer to the following instructions for your post-procedure care. Your surgeon or physician assistant will discuss any changes with you.  Activity  Avoid lifting more than 8 pounds (1 gallons of milk) for 72 hours (3 days) after your procedure. You may walk as much as you can tolerate. It's OK to drive after 72 hours.  Bathing/Showering  You may shower the day after your procedure. If you have a bandage, you may remove it at 24- 48 hours. Clean your incision site with mild soap and water. Pat the area dry with a clean towel.  Diet  Resume your pre-procedure diet. There are no special food restrictions following this procedure. All patients with peripheral vascular disease should follow a low fat/low cholesterol diet. In order to heal from your surgery, it is CRITICAL to get adequate nutrition. Your body requires vitamins, minerals, and protein. Vegetables are the best source of vitamins and minerals. Vegetables also provide the perfect balance of protein. Processed food has little nutritional value, so try to avoid this.  Medications  Resume taking all of your medications unless your doctor tells you not to. If your incision is causing  pain, you may take over-the-counter pain relievers such as acetaminophen (Tylenol)  Follow Up  Follow up will be arranged at the time of your procedure. You may have an office visit scheduled or may be scheduled for surgery. Ask your surgeon if you have any questions.  Please call us immediately for any of the following conditions: Severe or worsening pain your legs or feet at rest or with walking. Increased pain, redness, drainage at your groin puncture site. Fever of 101 degrees or higher. If you have any mild or slow bleeding from your puncture site: lie down, apply firm constant pressure over the area with a piece of gauze or a clean wash cloth for 30 minutes- no peeking!, call 911 right away if you are still bleeding after 30 minutes, or if the bleeding is heavy and unmanageable.  Reduce your risk factors of vascular disease:  Stop smoking. If you would like help call [facility] at 1-800-QUIT-NOW 630-567-0068) or [place] at (423)756-2254. Manage your cholesterol Maintain a desired weight Control your diabetes  Keep your blood pressure down  If you have any questions, please call the office at [phone] Moderate Conscious Sedation, Adult, Care After These instructions provide you with information about caring for yourself after your procedure. Your health care provider may also give you more specific instructions. Your treatment has been planned according to current medical practices, but problems sometimes occur. Call your health care provider if you have any problems or questions after your procedure. What can I expect after the procedure? After your procedure, it is common:  To feel sleepy for several hours.  To feel clumsy and have poor balance for several hours.  To have poor judgment for several hours.  To vomit if you eat too soon. Follow these instructions at home: For at least 24 hours after the procedure:   Do not: ? Participate in activities where you  could fall or become injured. ? Drive. ? Use heavy machinery. ? Drink alcohol. ? Take sleeping pills or medicines that cause drowsiness. ? Make important decisions or sign legal documents. ? Take care of children on your own.  Rest. Eating and drinking  Follow the diet recommended by your health care provider.  If you vomit: ? Drink water, juice, or soup when you can drink without vomiting. ? Make sure you have little or no nausea before eating solid foods. General instructions  Have a responsible adult stay with you until you are awake and alert.  Take over-the-counter and prescription medicines only as told by your health care provider.  If you smoke, do not smoke without supervision.  Keep all follow-up visits as told by your health care provider. This is important. Contact a health care provider if:  You keep feeling nauseous or you keep vomiting.  You feel light-headed.  You develop a rash.  You have a fever. Get help right away if:  You have trouble breathing. This information is not intended to replace advice given to you by your health care provider. Make sure you discuss any questions you have with your health care provider. Document Released: 03/11/2013 Document Revised: 05/03/2017 Document Reviewed: 09/10/2015 Elsevier Patient Education  2020 [employer].

## 2019-05-11 NOTE — Procedures (Addendum)
Interventional Radiology Procedure Note  Procedure: US guided access right & left CFA. Pelvic angiogram. Treatment of bilateral iliac occlusive disease, with right CIA VBX 7mm x 59mm, and left CIA VBX 9mm x 58mm, and PTA of CIA/EIA stenosis x 6mm.   Closure was bilateral angioseal  Findings: Final pressure measurements Aorta: 19mmHg Right EIA: 172mmHg Left EIA: 125mmHg.   Complications: None  Recommendations:  - Right and left hip x 4 hours - frequent NV checks bilateral lower extremity - Do not submerge for 7 days - Routine care  - start Eliquis tomorrow morning - hold metformin x 48 hrs - start 81mg  ASA daily x 1 month - follow up with Dr. Earleen Newport in 4-6 weeks, at [facility] [phone], [phone]) - maintain urinary catheter   Signed,  Dulcy Fanny. Earleen Newport, DO

## 2019-05-11 NOTE — H&P (Signed)
Chief Complaint: Patient was seen in consultation today for aortoperipheral angiogram with possible intervention  Referring Physician(s): [place]  Supervising Physician: Corrie Mckusick  Patient Status: [facility] - Out-pt  History of Present Illness: [name] is a 85 y.o. male with a past medical history significant for anxiety, BPPV, DM, HTN, HLD, anemia, CAD s/p CABG, CHF, A.fib on Eliquis, aortic stenosis s/p TAVR, severe pulmonary HTN, COPD on 4L O2 continuously, urinary retention with chronic indwelling foley and peripheral artery disease who presents today for a previously planned aortoperipheral angiogram with possible intervention. [name] has a history of PAD and dry gangrene of both feet for which he was previously referred to vascular surgery - he states that he and his wife met with the vascular surgeon and did not agree with the proposed plan of treatment, so they sought a second opinion. He met with Dr. Earleen Newport to discuss VIR options and after thorough discussion of procedure indications, risks, benefits and alternatives he and his wife have decided to pursue an angiogram with likely stent placement.  [name] is somewhat of a poor historian, he frequently answers questions regarding his medication and some symptoms with "I don't know, my wife knows all that." His main complaints are fatigue and hunger. He is unsure if he has any wounds on his feet or legs right now. He does state that his left lower extremity/foot is painful to touch. He endorses shortness of breath at rest and with any activity which is his baseline at home even when wearing 4L of oxygen. He states understanding of the procedure but asks that this wife sign his consent form.   Past Medical History:  Diagnosis Date   Acute myocardial infarction, unspecified site, initial episode of care    Anemia    Anxiety    Aortic stenosis     Atrial fibrillation 06/27/2013   Atrial fibrillation (HCC)      a. permanent, on Coumadin for anticoagulation   BPPV (benign paroxysmal positional vertigo) 2015   CAD (coronary artery disease)    a. s/p CABG in 1994 b. cath in 2015 showing normal LM, 100% LCx and RCA stenosis with 50-60% prox LAD stenosis and 100% mid-LAD stenosis; patent sequential SVG-OM2-OM3 and patent LIMA-LAD with PTCA of distal LAD via LIMA graft performed at that time   Carotid stenosis    CAROTID STENOSIS- moderate 09/09/2008   Qualifier: Diagnosis of  By: Burnett Kanaris     Cellulitis 11/15/2018   Chest pain with moderate risk of acute coronary syndrome 06/27/2013   CHF (congestive heart failure) (HCC)    Chronic anticoagulation 06/27/2013   COPD (chronic obstructive pulmonary disease) ([place])    Depression    Diabetes mellitus without complication ([place])    FASTING 98-120S   GERD (gastroesophageal reflux disease)    Gout attack- on steroid dose pack 06/27/2013   History of blood transfusion    History of peptic ulcer disease    Hyperlipidemia    Hypertension    Left atrial severely dilated 01/19/2019   Myocardial infarction ([place])    X2   NSTEMI (non-ST elevated myocardial infarction) ([place]) 08/2013   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 ([place]) 06/04/2013   PEPTIC ULCER DISEASE, HX OF 09/09/2008   Qualifier: Diagnosis of  By: Burnett Kanaris     PERIPHERAL VASCULAR DISEASE 09/09/2008   Qualifier: Diagnosis of  By: Burnett Kanaris     Peripheral vascular disease ([place])  HX OF  °• Presence of indwelling Foley catheter 01/20/2019  °• Pressure injury of skin 12/17/2018  °• Renal artery stenosis (HCC)   °• S/P thoracentesis   °• Severe aortic stenosis 09/17/2017  °• Sleep apnea   ° OXYGEN AT NIGHT 2L [place]  °• Stress fracture of calcaneus 2018  ° Dr Regal (Pod)  °• Tachycardia- beta blocker increased 06/26/2013  °• UNSPECIFIED ANEMIA 09/09/2008  ° Qualifier: Diagnosis of  By: Barnes, Kimalexis    °•  UNSPECIFIED CEREBROVASCULAR DISEASE 09/09/2008  ° Qualifier: Diagnosis of  By: Barnes, Kimalexis    °• Unstable angina (HCC) 08/31/2013  °• Vertigo 06/27/2013  ° ° °Past Surgical History:  °Procedure Laterality Date  °• AORTIC ARCH ANGIOGRAPHY N/A 08/14/2017  ° Procedure: AORTIC ARCH ANGIOGRAPHY;  Surgeon: Cooper, Michael, MD;  [facility]: [facility] INVASIVE [facility];  Service: Cardiovascular;  Laterality: N/A;  °• BOWEL RESECTION  07/2013  ° Bowel perforation  °• CARDIAC CATHETERIZATION    °• CAROTID ENDARTERECTOMY Right 2014  ° Todd Early, MD  °• COLON SURGERY    °• CORONARY ARTERY BYPASS GRAFT  1994  °• ENDARTERECTOMY Right 01/05/2013  ° Procedure: ENDARTERECTOMY CAROTID;  Surgeon: Todd F Early, MD;  Location: [facility] OR;  Service: Vascular;  Laterality: Right;  °• INCISION AND DRAINAGE Left 11/16/2018  ° Procedure: INCISION AND DRAINAGE LEFT FOREARM/ARM;  Surgeon: Kuzma, Kevin, MD;  Location: [facility] OR;  Service: Orthopedics;  Laterality: Left;  °• KNEE SURGERY  08/2003  ° left knee/ARTHROSCOPIC  °• LEFT HEART CATHETERIZATION WITH CORONARY/GRAFT ANGIOGRAM N/A 09/01/2013  ° Procedure: LEFT HEART CATHETERIZATION WITH CORONARY/GRAFT ANGIOGRAM;  Surgeon: Henry W Smith III, MD;  Location: [facility] CATH [facility];  Service: Cardiovascular;  Laterality: N/A;  °• PATCH ANGIOPLASTY Right 01/05/2013  ° Procedure: PATCH ANGIOPLASTY;  Surgeon: Todd F Early, MD;  Location: [facility] OR;  Service: Vascular;  Laterality: Right;  °• PERCUTANEOUS CORONARY STENT INTERVENTION (PCI-S)  09/01/2013  ° Procedure: PERCUTANEOUS CORONARY STENT INTERVENTION (PCI-S);  Surgeon: Henry W Smith III, MD;  Location: [facility];  Service: Cardiovascular;;  °• removal of bleeding ulcer  1994  °• RENAL ARTERY STENT    ° stenting of the left renal artery as well as a cutting balloon angioplasty for treatment of in-stent restenosis coronary artery bypass grafting in 1994.  °• RIGHT/LEFT HEART CATH AND CORONARY/GRAFT ANGIOGRAPHY N/A 08/14/2017  ° Procedure: RIGHT/LEFT HEART CATH AND CORONARY/GRAFT  ANGIOGRAPHY;  Surgeon: Cooper, Michael, MD;  [facility];  Service: Cardiovascular;  Laterality: N/A;  °• TEE WITHOUT CARDIOVERSION N/A 09/17/2017  ° Procedure: TRANSESOPHAGEAL ECHOCARDIOGRAM (TEE);  Surgeon: Cooper, Michael, MD;  Location: [facility] OR;  Service: Open Heart Surgery;  Laterality: N/A;  ° ° °Allergies: °Bactrim [sulfamethoxazole-trimethoprim] and Levaquin [levofloxacin in d5w] ° °Medications: °Prior to Admission medications   °Medication Sig Start Date End Date Taking? Authorizing Provider  °allopurinol (ZYLOPRIM) 300 MG tablet Take 300 mg by mouth daily.   Yes [provider]  °atorvastatin (LIPITOR) 80 MG tablet Take 1 tablet (80 mg total) by mouth at bedtime. 12/07/14  Yes Crenshaw, Brian S, MD  °brimonidine (ALPHAGAN) 0.2 % ophthalmic solution Place 1 drop into both eyes 2 (two) times daily.   Yes [provider]  °budesonide-formoterol (SYMBICORT) 80-4.5 MCG/ACT inhaler Inhale 2 puffs into the lungs 2 (two) times daily.  09/05/18  Yes [provider]  °Cholecalciferol (VITAMIN D) 50 MCG (2000 UT) tablet Take 1 tablet by mouth daily. 02/13/19  Yes [provider]  °digoxin (LANOXIN)   0.125 MG tablet Take 1 tablet (0.125 mg total) by mouth daily. 01/29/19  Yes Shirley, Jordan, DO  °ferrous sulfate 325 (65 FE) MG tablet Take 1 tablet (325 mg total) by mouth daily with breakfast. 02/22/19  Yes Joseph, Preetha, MD  °insulin aspart (NOVOLOG FLEXPEN) 100 UNIT/ML FlexPen Inject 2 Units into the skin See admin instructions. Inject 2 units into the skin at 9 AM in the morning as needed for a BGL >200   Yes [provider]  °lansoprazole (PREVACID) 30 MG capsule Take 60 mg by mouth 2 (two) times daily.    Yes [provider]  °levalbuterol (XOPENEX) 0.63 MG/3ML nebulizer solution Take 3 mLs (0.63 mg total) by nebulization every 6 (six) hours as needed for wheezing or shortness of breath. °Patient taking differently: Take 0.63 mg by nebulization 3  (three) times daily.  12/22/18  Yes Welborn, Ryan, DO  °Melatonin 5 MG TABS Take 5 mg by mouth at bedtime.   Yes [provider]  °metFORMIN (GLUCOPHAGE-XR) 500 MG 24 hr tablet Take 500 mg by mouth at bedtime.  12/10/18  Yes [provider]  °metoprolol tartrate (LOPRESSOR) 25 MG tablet Take 1 tablet (25 mg total) by mouth 2 (two) times daily. 02/22/19  Yes Joseph, Preetha, MD  °Multiple Vitamin (MULTIVITAMIN WITH MINERALS) TABS Take 1 tablet by mouth daily.   Yes [provider]  °NON FORMULARY Take 120 mLs by mouth See admin instructions. MedPass: Drink 120 ml's by mouth two times a day   Yes [provider]  °OXYGEN Inhale 3 L/min into the lungs continuous. TO MAINTAIN SATS <90%   Yes [provider]  °polyethylene glycol (MIRALAX / GLYCOLAX) 17 g packet Take 17 g by mouth 2 (two) times daily. °Patient taking differently: Take 17 g by mouth as needed for mild constipation or moderate constipation.  12/22/18  Yes Welborn, Ryan, DO  °potassium chloride (KLOR-CON M15) 15 MEQ tablet Take 2 tablets (30 mEq total) by mouth 2 (two) times daily. °Patient taking differently: Take 15 mEq by mouth 2 (two) times daily.  01/28/19  Yes Shirley, Jordan, DO  °spironolactone (ALDACTONE) 25 MG tablet Take 25 mg by mouth daily.   Yes [provider]  °tamsulosin (FLOMAX) 0.4 MG CAPS capsule Take 1 capsule (0.4 mg total) by mouth daily after supper. 12/22/18  Yes Welborn, Ryan, DO  °torsemide (DEMADEX) 20 MG tablet Take 2 tablets (40 mg total) by mouth 2 (two) times daily. Take additional 20 mg if S OB, edema or > 2 lbs weight gain 03/26/19  Yes Gonfa, Taye T, MD  °trolamine salicylate (ASPERCREME) 10 % cream Apply 1 application topically every 6 (six) hours as needed for muscle pain.    Yes [provider]  °umeclidinium bromide (INCRUSE ELLIPTA) 62.5 MCG/INH AEPB Inhale 1 puff into the lungs daily. 11/27/18  Yes Frank, Peter, MD  °apixaban (ELIQUIS) 5 MG TABS tablet Take 1  tablet (5 mg total) by mouth 2 (two) times daily. 12/22/18 04/29/19  Welborn, Ryan, DO  °  ° °Family History  °Problem Relation Age of Onset  °• Coronary artery disease Mother   °• Heart disease Mother   °     Before age 60  °• Hyperlipidemia Mother   °• Hypertension Mother   °• Heart attack Mother   °• Coronary artery disease Father   °• Heart disease Father   °     After age 60  °• Heart attack Father   °• Heart disease   Heart disease Sister        After age 21   Heart disease Brother        After age 17   Coronary artery disease Other        13 sibling, almost all have coronary disease and some with premature onset   Heart disease Other        13 sibling, almost all have coronary disease and some with premature onset    Social History   Socioeconomic History   Marital status: Married    Spouse name: Not on file   Number of children: Not on file   Years of education: Not on file   Highest education level: Not on file  Occupational History   Not on file  Social Needs   Financial resource strain: Not on file   Food insecurity    Worry: Not on file    Inability: Not on file   Transportation needs    Medical: Not on file    Non-medical: Not on file  Tobacco Use   Smoking status: Former Smoker    Quit date: 04/27/1993    Years since quitting: 26.0   Smokeless tobacco: Never Used  Substance and Sexual Activity   Alcohol use: No    Alcohol/week: 0.0 standard drinks   Drug use: No   Sexual activity: Yes  Lifestyle   Physical activity    Days per week: Not on file    Minutes per session: Not on file   Stress: Not on file  Relationships   Social connections    Talks on phone: Not on file    Gets together: Not on file    Attends religious service: Not on file    Active member of club or organization: Not on file    Attends meetings of clubs or organizations: Not on file    Relationship status: Not on file  Other Topics Concern   Not on file  Social History Narrative    Social History:   Married    Tobacco Use - No.    Alcohol Use - yes         Family History:   Mother died at age 49 of coronary disease.  Father died at 62 of coronary disease.  He has 13 siblings, almost all of whom have coronary disease and some with premature onset.     Review of Systems: A 12 point ROS discussed and pertinent positives are indicated in the HPI above.  All other systems are negative.  Review of Systems  Constitutional: Positive for chills ("I feel cold all the time") and fatigue. Negative for fever.       Limited ROS - patient is a poor historian  HENT: Negative for nosebleeds.   Respiratory: Positive for shortness of breath. Negative for cough.   Cardiovascular: Negative for chest pain.  Gastrointestinal: Negative for abdominal pain, blood in stool, diarrhea, nausea and vomiting.  Musculoskeletal: Positive for gait problem ("hurts to walk").  Neurological: Positive for dizziness ("when I stand up").    Vital Signs: BP (!) 131/49    Pulse 67    Temp 97.7 F (36.5 C) (Oral)    Resp 18    Ht 5' 11" (1.803 m)    Wt 162 lb 3.2 oz (73.6 kg)    SpO2 99%    BMI 22.62 kg/m   Physical Exam Vitals signs reviewed.  Constitutional:      General: He is not in acute distress.  Comments: Pleasant, somewhat somnolent - answers some questions but defers to his wife for most questions  HENT:     Head: Normocephalic.     Mouth/Throat:     Mouth: Mucous membranes are moist.     Pharynx: Oropharynx is clear. No oropharyngeal exudate or posterior oropharyngeal erythema.  Cardiovascular:     Rate and Rhythm: Normal rate and regular rhythm.     Heart sounds: Murmur present.  Pulmonary:     Effort: Pulmonary effort is normal.     Comments: (+) 4L O2 via [place] Abdominal:     General: Bowel sounds are normal. There is no distension.     Palpations: Abdomen is soft.     Tenderness: There is no abdominal tenderness.  Skin:    General: Skin is warm and dry.  Neurological:      Mental Status: He is alert and oriented to person, place, and time.  Psychiatric:        Mood and Affect: Mood normal.        Behavior: Behavior normal.        Thought Content: Thought content normal.        Judgment: Judgment normal.      MD Evaluation Airway: WNL Heart: WNL Abdomen: WNL Chest/ Lungs: WNL ASA  Classification: 2 Mallampati/Airway Score: Two   Imaging: Dg Chest 2 View  Result Date: 04/29/2019 CLINICAL DATA:  Shortness of breath. EXAM: CHEST - 2 VIEW COMPARISON:  03/25/2019. FINDINGS: Prior CABG. Prior cardiac valve replacement. Cardiomegaly with pulmonary vascular prominence bilateral interstitial prominence and bilateral pleural effusions. Findings have progressed from prior exam. Findings are consistent with CHF. No pneumothorax. No acute bony abnormality. IMPRESSION: Prior CABG. Prior cardiac valve replacement. Cardiomegaly with diffuse bilateral from interstitial prominence and bilateral pleural effusions. Findings consistent with CHF. Findings have progressed from prior study of 03/25/2019. Electronically Signed   By: Marcello Moores  Register   On: 04/29/2019 14:50   Ct Head Wo Contrast  Result Date: 04/29/2019 CLINICAL DATA:  Altered mental status. EXAM: CT HEAD WITHOUT CONTRAST TECHNIQUE: Contiguous axial images were obtained from the base of the skull through the vertex without intravenous contrast. COMPARISON:  None. FINDINGS: Brain: No intracranial hemorrhage, mass effect, or midline shift. Generalized atrophy, normal for age. Mild chronic small vessel ischemia. No hydrocephalus. The basilar cisterns are patent. No evidence of territorial infarct or acute ischemia. No extra-axial or intracranial fluid collection. Vascular: Atherosclerosis of skullbase vasculature without hyperdense vessel or abnormal calcification. Skull: No fracture or focal lesion. Sinuses/Orbits: Scattered mucosal thickening of ethmoid air cells. No sinus fluid level. Slight rightward nasal septal  deviation. Bilateral cataract resection. The mastoid air cells are clear. Other: None. IMPRESSION: 1. No acute intracranial abnormality. 2. Age related atrophy and chronic small vessel ischemia. Electronically Signed   By: Keith Rake M.D.   On: 04/29/2019 17:27   Vas US Carotid  Result Date: 05/02/2019 Carotid Arterial Duplex Study Indications:       Syncope. Risk Factors:      Hypertension, hyperlipidemia, Diabetes, coronary artery                    disease. Comparison Study:  Compared to study performed on 03/31/2018 there has been no                    significant changes in the left ICA stenosis. Performing Technologist: Darlina Sicilian RDCS  Examination Guidelines: A complete evaluation includes B-mode imaging, spectral Doppler, color Doppler, and  power Doppler as needed of all accessible portions of each vessel. Bilateral testing is considered an integral part of a complete examination. Limited examinations for reoccurring indications may be performed as noted.  Right Carotid Findings: +----------+--------+--------+--------+------------------+--------+             PSV cm/s EDV cm/s Stenosis Plaque Description Comments  +----------+--------+--------+--------+------------------+--------+  CCA Prox   154      15                heterogenous                 +----------+--------+--------+--------+------------------+--------+  CCA Mid    80       10                heterogenous                 +----------+--------+--------+--------+------------------+--------+  CCA Distal                            heterogenous                 +----------+--------+--------+--------+------------------+--------+  ICA Prox   59       7        1-39%                                 +----------+--------+--------+--------+------------------+--------+  ICA Mid    84       22                                             +----------+--------+--------+--------+------------------+--------+  ICA Distal 85       17                                              +----------+--------+--------+--------+------------------+--------+  ECA        91                                                      +----------+--------+--------+--------+------------------+--------+ +----------+--------+-------+----------------+-------------------+             PSV cm/s EDV cms Describe         Arm Pressure (mmHG)  +----------+--------+-------+----------------+-------------------+  Subclavian                  Multiphasic, WNL                      +----------+--------+-------+----------------+-------------------+ +---------+--------+--+--------+--+---------+  Vertebral PSV cm/s 90 EDV cm/s 12 Antegrade  +---------+--------+--+--------+--+---------+  Left Carotid Findings: +----------+--------+--------+--------+---------------------+------------------+             PSV cm/s EDV cm/s Stenosis Plaque Description    Comments            +----------+--------+--------+--------+---------------------+------------------+  CCA Prox   62       7  intimal thickening  +----------+--------+--------+--------+---------------------+------------------+  CCA Mid                                                     intimal thickening  +----------+--------+--------+--------+---------------------+------------------+  CCA Distal 70       11                heterogenous                              +----------+--------+--------+--------+---------------------+------------------+  ICA Prox   217      41       40-59%   hyperechoic, calcific                                                            and irregular                             +----------+--------+--------+--------+---------------------+------------------+  ICA Mid    133      12                                                          +----------+--------+--------+--------+---------------------+------------------+  ICA Distal 77       14                                                           +----------+--------+--------+--------+---------------------+------------------+  ECA        93                                                                   +----------+--------+--------+--------+---------------------+------------------+ +----------+--------+--------+----------------+-------------------+             PSV cm/s EDV cm/s Describe         Arm Pressure (mmHG)  +----------+--------+--------+----------------+-------------------+  Subclavian 150               Multiphasic, WNL                      +----------+--------+--------+----------------+-------------------+ +---------+--------+--+--------+-+---------+  Vertebral PSV cm/s 49 EDV cm/s 7 Antegrade  +---------+--------+--+--------+-+---------+  Summary: Right Carotid: Velocities in the right ICA are consistent with a 1-39% stenosis.                The extracranial vessels were near-normal with only minimal wall                thickening or plaque. Left Carotid: Velocities in the left ICA are   consistent with a 40-59% stenosis. Vertebrals:  Bilateral vertebral arteries demonstrate antegrade flow. Subclavians: Normal flow hemodynamics were seen in bilateral subclavian              arteries. *See table(s) above for measurements and observations.  Electronically signed by Curt Jews MD on 05/02/2019 at 9:26:43 AM.    Final     Labs:  CBC: Recent Labs    04/30/19 0438 05/01/19 0825 05/01/19 1532 05/11/19 0705  WBC 8.5 9.1 8.5 10.0  HGB 8.0* 7.6* 7.8* 9.8*  HCT 25.4* 24.1* 24.3* 31.8*  PLT 349 334 284 375    COAGS: Recent Labs    11/15/18 2000 12/31/18 1515 01/20/19 0023 05/11/19 0705  INR 1.3* 1.9* 1.9* 1.2    BMP: Recent Labs    03/26/19 0321 04/29/19 1409 04/29/19 1617 04/30/19 0438 05/01/19 0825  NA 136 131* 129* 133* 135  K 4.0 4.1 3.9 3.6 3.5  CL 95* 87*  --  91* 92*  CO2 32 31  --  32 33*  GLUCOSE 112* 159*  --  99 163*  BUN 14 27*  --  23 21  CALCIUM 9.0 8.9  --  9.0 9.2  CREATININE 0.87 1.07  --  1.05 0.96    GFRNONAA >60 >60  --  >60 >60  GFRAA >60 >60  --  >60 >60    LIVER FUNCTION TESTS: Recent Labs    12/17/18 0557 01/01/19 0605 01/19/19 1408 02/17/19 1333  BILITOT 0.7 1.1 0.7 0.6  AST 22 30 33 29  ALT 25 36 30 24  ALKPHOS 138* 160* 165* 111  PROT 5.6* 5.8* 5.8* 5.8*  ALBUMIN 3.0* 2.8* 2.6* 3.3*    TUMOR MARKERS: No results for input(s): AFPTM, CEA, CA199, CHROMGRNA in the last 8760 hours.  Assessment and Plan:  85 y/o M with significant cardiac history as detailed in HPI who presents today for previously planned aortperipheral angiogram with likely stent placement due to peripheral artery disease and history of bilateral lower extremity dry gangrene.   Patient has been NPO since 7 pm last night except for a few sips of water with his AM medicines. Afebrile, WBC 10.0, hgb 9.8, plt 375, creatinine 0.94, INR 1.2.  Risks and benefits of aortoperipheral arteriogram with possible intervention were discussed with the patient including, but not limited to bleeding, infection, vascular injury, contrast induced renal failure, stroke, reperfusion hemorrhage, or even death. This interventional procedure involves the use of X-rays and because of the nature of the planned procedure, it is possible that we will have prolonged use of X-ray fluoroscopy. Potential radiation risks to you include (but are not limited to) the following: - A slightly elevated risk for cancer  several years later in life. This risk is typically less than 0.5% percent. This risk is low in comparison to the normal incidence of human cancer, which is 33% for women and 50% for men according to the [employer]. - Radiation induced injury can include skin redness, resembling a rash, tissue breakdown / ulcers and hair loss (which can be temporary or permanent).  The likelihood of either of these occurring depends on the difficulty of the procedure and whether you are sensitive to radiation due to previous  procedures, disease, or genetic conditions.  IF your procedure requires a prolonged use of radiation, you will be notified and given written instructions for further action.  It is your responsibility to monitor the irradiated area for the 2 weeks following the procedure and to  notify your physician if you are concerned that you have suffered a radiation induced injury.    All of the patient's questions were answered, patient is agreeable to proceed.  Consent signed and in chart.  Thank you for this interesting consult.  I greatly enjoyed meeting ADEL BURCH and look forward to participating in their care.  A copy of this report was sent to the requesting provider on this date.  Electronically Signed: Joaquim Nam, PA-C 05/11/2019, 7:48 AM   I spent a total of 15 Minutes in face to face in clinical consultation, greater than 50% of which was counseling/coordinating care for aortoperipheral angiogram with possible intervention.

## 2019-05-11 NOTE — Sedation Documentation (Signed)
Aortic Pressure 128/45 (73) Right Iliac pressure 123/43 (71) Left Iliac pressure 123/41 (69)

## 2019-05-11 NOTE — Progress Notes (Signed)
Discahrge instructions reviewed with pt and his wife both voice understanding.

## 2019-05-12 ENCOUNTER — Encounter (HOSPITAL_COMMUNITY): Payer: Self-pay

## 2019-05-18 NOTE — Progress Notes (Deleted)
HPI: FU coronary artery disease, status post coronary artery bypassing graft, permanent atrial fibrillation, ASs/p TAVRand peripheral vascular disease (renal artery stenosis and cerebrovascular disease). Pt had right carotid endarterectomy in August of 2014.  Echocardiogram February 2019 showed severe aortic stenosis.  Patient had cardiac catheterization March 2019 that showed occluded native vessels. The saphenous vein graft to the first and second marginals was patent as was the LIMA to the LAD. There was moderate stenosis of the apical LAD at previous angioplasty site. Patient subsequently underwent TAVR.Abdominal ultrasound June 2019 showed 1 to 59% bilateral renal artery stenosis, 70 to 99% celiac artery and 2.7 x 2.7 ectatic abdominal aorta. Echocardiogram July 2020 showed normal LV function, mild left ventricular hypertrophy, biatrial enlargement, moderate to severe mitral stenosis (mean gradient 13 mmHg), mild to moderate MR, prior aortic valve replacement with mean gradient 11 mmHg and trace aortic insufficiency, moderate tricuspid regurgitation with moderate pulmonary hypertension.  Carotid Dopplers November 2020 showed 1 to 39% right and 40 to 59% left stenosis.  Patient has had problems with recurrent congestive heart failure.  Since last seen   Current Outpatient Medications  Medication Sig Dispense Refill  . allopurinol (ZYLOPRIM) 300 MG tablet Take 300 mg by mouth daily.    [name] apixaban (ELIQUIS) 5 MG TABS tablet Take 1 tablet (5 mg total) by mouth 2 (two) times daily. 60 tablet   . atorvastatin (LIPITOR) 80 MG tablet Take 1 tablet (80 mg total) by mouth at bedtime. 90 tablet 3  . brimonidine (ALPHAGAN) 0.2 % ophthalmic solution Place 1 drop into both eyes 2 (two) times daily.    . budesonide-formoterol (SYMBICORT) 80-4.5 MCG/ACT inhaler Inhale 2 puffs into the lungs 2 (two) times daily.     . Cholecalciferol (VITAMIN D) 50 MCG (2000 UT) tablet Take 1 tablet by mouth daily.     . digoxin (LANOXIN) 0.125 MG tablet Take 1 tablet (0.125 mg total) by mouth daily.    . ferrous sulfate 325 (65 FE) MG tablet Take 1 tablet (325 mg total) by mouth daily with breakfast.    . insulin aspart (NOVOLOG FLEXPEN) 100 UNIT/ML FlexPen Inject 2 Units into the skin See admin instructions. Inject 2 units into the skin at 9 AM in the morning as needed for a BGL >200    . lansoprazole (PREVACID) 30 MG capsule Take 60 mg by mouth 2 (two) times daily.     [name] levalbuterol (XOPENEX) 0.63 MG/3ML nebulizer solution Take 3 mLs (0.63 mg total) by nebulization every 6 (six) hours as needed for wheezing or shortness of breath. (Patient taking differently: Take 0.63 mg by nebulization 3 (three) times daily. ) 3 mL 12  . Melatonin 5 MG TABS Take 5 mg by mouth at bedtime.    . metFORMIN (GLUCOPHAGE-XR) 500 MG 24 hr tablet Take 500 mg by mouth at bedtime.     . metoprolol tartrate (LOPRESSOR) 25 MG tablet Take 1 tablet (25 mg total) by mouth 2 (two) times daily.    . Multiple Vitamin (MULTIVITAMIN WITH MINERALS) TABS Take 1 tablet by mouth daily.    . NON FORMULARY Take 120 mLs by mouth See admin instructions. MedPass: Drink 120 ml's by mouth two times a day    . OXYGEN Inhale 3 L/min into the lungs continuous. TO MAINTAIN SATS <90%    . polyethylene glycol (MIRALAX / GLYCOLAX) 17 g packet Take 17 g by mouth 2 (two) times daily. (Patient taking differently: Take 17 g by mouth as  needed for mild constipation or moderate constipation. ) 14 each 0  . potassium chloride (KLOR-CON M15) 15 MEQ tablet Take 2 tablets (30 mEq total) by mouth 2 (two) times daily. (Patient taking differently: Take 15 mEq by mouth 2 (two) times daily. )    . spironolactone (ALDACTONE) 25 MG tablet Take 25 mg by mouth daily.    . tamsulosin (FLOMAX) 0.4 MG CAPS capsule Take 1 capsule (0.4 mg total) by mouth daily after supper. 30 capsule   . torsemide (DEMADEX) 20 MG tablet Take 2 tablets (40 mg total) by mouth 2 (two) times daily. Take  additional 20 mg if S OB, edema or > 2 lbs weight gain 150 tablet 1  . trolamine salicylate (ASPERCREME) 10 % cream Apply 1 application topically every 6 (six) hours as needed for muscle pain.     [name] umeclidinium bromide (INCRUSE ELLIPTA) 62.5 MCG/INH AEPB Inhale 1 puff into the lungs daily. 1 each 0   No current facility-administered medications for this visit.     Past Medical History:  Diagnosis Date  . Acute myocardial infarction, unspecified site, initial episode of care   . Anemia   . Anxiety   . Aortic stenosis    . Atrial fibrillation 06/27/2013  . Atrial fibrillation ([place])    a. permanent, on Coumadin for anticoagulation  . BPPV (benign paroxysmal positional vertigo) 2015  . CAD (coronary artery disease)    a. s/p CABG in 1994 b. cath in 2015 showing normal LM, 100% LCx and RCA stenosis with 50-60% prox LAD stenosis and 100% mid-LAD stenosis; patent sequential SVG-OM2-OM3 and patent LIMA-LAD with PTCA of distal LAD via LIMA graft performed at that time  . Carotid stenosis   . CAROTID STENOSIS- moderate 09/09/2008   Qualifier: Diagnosis of  By: Burnett Kanaris    . Cellulitis 11/15/2018  . Chest pain with moderate risk of acute coronary syndrome 06/27/2013  . CHF (congestive heart failure) ([place])   . Chronic anticoagulation 06/27/2013  . COPD (chronic obstructive pulmonary disease) ([place])   . Depression   . Diabetes mellitus without complication ([place])    FASTING 98-120S  . GERD (gastroesophageal reflux disease)   . Gout attack- on steroid dose pack 06/27/2013  . History of blood transfusion   . History of peptic ulcer disease   . Hyperlipidemia   . Hypertension   . Left atrial severely dilated 01/19/2019  . Myocardial infarction ([place])    X2  . NSTEMI (non-ST elevated myocardial infarction) ([place]) 08/2013  . 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 PEPTIC ULCER DISEASE, HX OF 09/09/2008   Qualifier: Diagnosis of  By:  Burnett Kanaris    . PERIPHERAL VASCULAR DISEASE 09/09/2008   Qualifier: Diagnosis of  By: Burnett Kanaris    . Peripheral vascular disease ([place])   . Pneumonia    HX OF  . Presence of indwelling Foley catheter 01/20/2019  . Pressure injury of skin 12/17/2018  . Renal artery stenosis ([place])   . S/P thoracentesis   . Severe aortic stenosis 09/17/2017  . Sleep apnea    OXYGEN AT NIGHT 2L [place]  . Stress fracture of calcaneus 2018   Dr Paulla [facility])  . Tachycardia- beta blocker increased 06/26/2013  . UNSPECIFIED ANEMIA 09/09/2008   Qualifier: Diagnosis of  By: Burnett Kanaris    . UNSPECIFIED CEREBROVASCULAR DISEASE 09/09/2008   Qualifier: Diagnosis of  By: Burnett Kanaris    . Unstable angina ([place]) 08/31/2013  .  Vertigo 06/27/2013    Past Surgical History:  Procedure Laterality Date  . AORTIC ARCH ANGIOGRAPHY N/A 08/14/2017   Procedure: AORTIC ARCH ANGIOGRAPHY;  Surgeon: Sherren Mocha, MD;  Location: [place] [facility];  Service: Cardiovascular;  Laterality: N/A;  . BOWEL RESECTION  07/2013   Bowel perforation  . CARDIAC CATHETERIZATION    . CAROTID ENDARTERECTOMY Right 2014   Curt Jews, MD  . COLON SURGERY    . CORONARY ARTERY BYPASS GRAFT  1994  . ENDARTERECTOMY Right 01/05/2013   Procedure: ENDARTERECTOMY CAROTID;  Surgeon: Rosetta Posner, MD;  Location: [place];  Service: Vascular;  Laterality: Right;  . INCISION AND DRAINAGE Left 11/16/2018   Procedure: INCISION AND DRAINAGE LEFT FOREARM/ARM;  Surgeon: Leanora Cover, MD;  Location: [place];  Service: Orthopedics;  Laterality: Left;  . IR ANGIOGRAM EXTREMITY BILATERAL  05/11/2019  . IR ILIAC ART STENT INC PTA MOD SED  05/11/2019  . IR US GUIDE VASC ACCESS LEFT  05/11/2019  . IR US GUIDE VASC ACCESS RIGHT  05/11/2019  . KNEE SURGERY  08/2003   left knee/ARTHROSCOPIC  . LEFT HEART CATHETERIZATION WITH CORONARY/GRAFT ANGIOGRAM N/A 09/01/2013   Procedure: LEFT HEART CATHETERIZATION WITH Beatrix Fetters;  Surgeon: Sinclair Grooms, MD;   Location[facility] C[facility];  Service: Cardiovascular;  Laterality: N/A;  . PATCH ANGIOPLASTY Right 01/05/2013   Procedure: PATCH ANGIOPLASTY;  Surgeon: Rosetta Posner, MD;  Location: [place];  Service: Vascular;  Laterality: Right;  . PERCUTANEOUS CORONARY STENT INTERVENTION (PCI-S)  09/01/2013   Procedure: PERCUTANEOUS CORONARY STENT INTERVENTION (PCI-S);  Surgeon: Sinclair Grooms, MD;  Location: [facility] CATH LAB;  Service: Cardiovascular;;  . removal of bleeding ulcer  1994  . RENAL ARTERY STENT     stenting of the left renal artery as well as a cutting balloon angioplasty for treatment of in-stent restenosis coronary artery bypass grafting in 1994.  [name] RIGHT/LEFT HEART CATH AND CORONARY/GRAFT ANGIOGRAPHY N/A 08/14/2017   Procedure: RIGHT/LEFT HEART CATH AND CORONARY/GRAFT ANGIOGRAPHY;  Surgeon: Sherren Mocha, MD;  Location: [place] [facility];  Service: Cardiovascular;  Laterality: N/A;  . TEE WITHOUT CARDIOVERSION N/A 09/17/2017   Procedure: TRANSESOPHAGEAL ECHOCARDIOGRAM (TEE);  Surgeon: Sherren Mocha, MD;  Location: [place];  Service: Open Heart Surgery;  Laterality: N/A;    Social History   Socioeconomic History  . Marital status: Married    Spouse name: Not on file  . Number of children: Not on file  . Years of education: Not on file  . Highest education level: Not on file  Occupational History  . Not on file  Tobacco Use  . Smoking status: Former Smoker    Quit date: 04/27/1993    Years since quitting: 26.0  . Smokeless tobacco: Never Used  Substance and Sexual Activity  . Alcohol use: No    Alcohol/week: 0.0 standard drinks  . Drug use: No  . Sexual activity: Yes  Other Topics Concern  . Not on file  Social History Narrative   Social History:   Married    Tobacco Use - No.    Alcohol Use - yes         Family History:   Mother died at age 80 of coronary disease.  Father died at 36 of coronary disease.  He has 13 siblings, almost all of whom have coronary disease and some with  premature onset.   Social Determinants of Health   Financial Resource Strain:   . Difficulty of Paying Living  Expenses: Not on file  Food Insecurity:   . Worried About Charity fundraiser in the Last Year: Not on file  . Ran Out of Food in the Last Year: Not on file  Transportation Needs:   . Lack of Transportation (Medical): Not on file  . Lack of Transportation (Non-Medical): Not on file  Physical Activity:   . Days of Exercise per Week: Not on file  . Minutes of Exercise per Session: Not on file  Stress:   . Feeling of Stress : Not on file  Social Connections:   . Frequency of Communication with Friends and Family: Not on file  . Frequency of Social Gatherings with Friends and Family: Not on file  . Attends Religious Services: Not on file  . Active Member of Clubs or Organizations: Not on file  . Attends Archivist Meetings: Not on file  . Marital Status: Not on file  Intimate Partner Violence:   . Fear of Current or Ex-Partner: Not on file  . Emotionally Abused: Not on file  . Physically Abused: Not on file  . Sexually Abused: Not on file    Family History  Problem Relation Age of Onset  . Coronary artery disease Mother   . Heart disease Mother        Before age 35  . Hyperlipidemia Mother   . Hypertension Mother   . Heart attack Mother   . Coronary artery disease Father   . Heart disease Father        After age 9  . Heart attack Father   . Heart disease Sister        After age 53  . Heart disease Brother        After age 47  . Coronary artery disease Other        13 sibling, almost all have coronary disease and some with premature onset  . Heart disease Other        13 sibling, almost all have coronary disease and some with premature onset    ROS: no fevers or chills, productive cough, hemoptysis, dysphasia, odynophagia, melena, hematochezia, dysuria, hematuria, rash, seizure activity, orthopnea, PND, pedal edema, claudication. Remaining systems  are negative.  Physical Exam: Well-developed well-nourished in no acute distress.  Skin is warm and dry.  HEENT is normal.  Neck is supple.  Chest is clear to auscultation with normal expansion.  Cardiovascular exam is regular rate and rhythm.  Abdominal exam nontender or distended. No masses palpated. Extremities show no edema. neuro grossly intact  ECG- personally reviewed  A/P  1 chronic diastolic congestive heart failure-patient doing reasonably well from a symptomatic standpoint.  Continue Demadex at present dose.  Check potassium and renal function.  Continue fluid restriction and low-sodium diet.  2 prior TAVR-continue SBE prophylaxis.  3 permanent atrial fibrillation-continue metoprolol and digoxin for rate control.  Continue apixaban.  4 coronary artery disease-patient has not had recurrent chest pain.  Continue statin.  He is not on aspirin given need for apixaban.  5 mitral stenosis-moderate to severe on most recent echocardiogram.  Patient is not a candidate for mitral valve surgery.  6 necrotic digits-follow-up vascular surgery.  7 carotid artery disease-plan follow-up carotid Dopplers November 2021.  8 history of renal artery stenosis-we will arrange follow-up Dopplers.  Kirk Ruths, MD

## 2019-05-21 ENCOUNTER — Ambulatory Visit: Payer: Medicare Other | Admitting: Cardiology

## 2019-06-05 DIAGNOSIS — I342 Nonrheumatic mitral (valve) stenosis: Secondary | ICD-10-CM | POA: Diagnosis not present

## 2019-06-05 DIAGNOSIS — I272 Pulmonary hypertension, unspecified: Secondary | ICD-10-CM | POA: Diagnosis not present

## 2019-06-05 DIAGNOSIS — I2510 Atherosclerotic heart disease of native coronary artery without angina pectoris: Secondary | ICD-10-CM | POA: Diagnosis not present

## 2019-06-05 DIAGNOSIS — I5032 Chronic diastolic (congestive) heart failure: Secondary | ICD-10-CM | POA: Diagnosis not present

## 2019-06-05 DIAGNOSIS — M109 Gout, unspecified: Secondary | ICD-10-CM | POA: Diagnosis not present

## 2019-06-05 DIAGNOSIS — J449 Chronic obstructive pulmonary disease, unspecified: Secondary | ICD-10-CM | POA: Diagnosis not present

## 2019-06-05 DIAGNOSIS — I350 Nonrheumatic aortic (valve) stenosis: Secondary | ICD-10-CM | POA: Diagnosis not present

## 2019-06-05 DIAGNOSIS — I701 Atherosclerosis of renal artery: Secondary | ICD-10-CM | POA: Diagnosis not present

## 2019-06-05 DIAGNOSIS — I110 Hypertensive heart disease with heart failure: Secondary | ICD-10-CM | POA: Diagnosis not present

## 2019-06-05 DIAGNOSIS — Z87891 Personal history of nicotine dependence: Secondary | ICD-10-CM | POA: Diagnosis not present

## 2019-06-05 DIAGNOSIS — E1152 Type 2 diabetes mellitus with diabetic peripheral angiopathy with gangrene: Secondary | ICD-10-CM | POA: Diagnosis not present

## 2019-06-05 DIAGNOSIS — Z95828 Presence of other vascular implants and grafts: Secondary | ICD-10-CM | POA: Diagnosis not present

## 2019-06-05 DIAGNOSIS — M1990 Unspecified osteoarthritis, unspecified site: Secondary | ICD-10-CM | POA: Diagnosis not present

## 2019-06-05 DIAGNOSIS — I482 Chronic atrial fibrillation, unspecified: Secondary | ICD-10-CM | POA: Diagnosis not present

## 2019-06-05 DIAGNOSIS — Z794 Long term (current) use of insulin: Secondary | ICD-10-CM | POA: Diagnosis not present

## 2019-06-08 DIAGNOSIS — I5032 Chronic diastolic (congestive) heart failure: Secondary | ICD-10-CM | POA: Diagnosis not present

## 2019-06-08 DIAGNOSIS — I110 Hypertensive heart disease with heart failure: Secondary | ICD-10-CM | POA: Diagnosis not present

## 2019-06-08 DIAGNOSIS — I2510 Atherosclerotic heart disease of native coronary artery without angina pectoris: Secondary | ICD-10-CM | POA: Diagnosis not present

## 2019-06-08 DIAGNOSIS — I272 Pulmonary hypertension, unspecified: Secondary | ICD-10-CM | POA: Diagnosis not present

## 2019-06-08 DIAGNOSIS — I350 Nonrheumatic aortic (valve) stenosis: Secondary | ICD-10-CM | POA: Diagnosis not present

## 2019-06-08 DIAGNOSIS — I342 Nonrheumatic mitral (valve) stenosis: Secondary | ICD-10-CM | POA: Diagnosis not present

## 2019-06-11 DIAGNOSIS — I5032 Chronic diastolic (congestive) heart failure: Secondary | ICD-10-CM | POA: Diagnosis not present

## 2019-06-11 DIAGNOSIS — I2510 Atherosclerotic heart disease of native coronary artery without angina pectoris: Secondary | ICD-10-CM | POA: Diagnosis not present

## 2019-06-11 DIAGNOSIS — I342 Nonrheumatic mitral (valve) stenosis: Secondary | ICD-10-CM | POA: Diagnosis not present

## 2019-06-11 DIAGNOSIS — I350 Nonrheumatic aortic (valve) stenosis: Secondary | ICD-10-CM | POA: Diagnosis not present

## 2019-06-11 DIAGNOSIS — I272 Pulmonary hypertension, unspecified: Secondary | ICD-10-CM | POA: Diagnosis not present

## 2019-06-11 DIAGNOSIS — I110 Hypertensive heart disease with heart failure: Secondary | ICD-10-CM | POA: Diagnosis not present

## 2019-06-15 DIAGNOSIS — I272 Pulmonary hypertension, unspecified: Secondary | ICD-10-CM | POA: Diagnosis not present

## 2019-06-15 DIAGNOSIS — I2510 Atherosclerotic heart disease of native coronary artery without angina pectoris: Secondary | ICD-10-CM | POA: Diagnosis not present

## 2019-06-15 DIAGNOSIS — I342 Nonrheumatic mitral (valve) stenosis: Secondary | ICD-10-CM | POA: Diagnosis not present

## 2019-06-15 DIAGNOSIS — I5032 Chronic diastolic (congestive) heart failure: Secondary | ICD-10-CM | POA: Diagnosis not present

## 2019-06-15 DIAGNOSIS — I350 Nonrheumatic aortic (valve) stenosis: Secondary | ICD-10-CM | POA: Diagnosis not present

## 2019-06-15 DIAGNOSIS — I110 Hypertensive heart disease with heart failure: Secondary | ICD-10-CM | POA: Diagnosis not present

## 2019-06-16 DIAGNOSIS — I5032 Chronic diastolic (congestive) heart failure: Secondary | ICD-10-CM | POA: Diagnosis not present

## 2019-06-16 DIAGNOSIS — I110 Hypertensive heart disease with heart failure: Secondary | ICD-10-CM | POA: Diagnosis not present

## 2019-06-16 DIAGNOSIS — I2510 Atherosclerotic heart disease of native coronary artery without angina pectoris: Secondary | ICD-10-CM | POA: Diagnosis not present

## 2019-06-16 DIAGNOSIS — I350 Nonrheumatic aortic (valve) stenosis: Secondary | ICD-10-CM | POA: Diagnosis not present

## 2019-06-16 DIAGNOSIS — I272 Pulmonary hypertension, unspecified: Secondary | ICD-10-CM | POA: Diagnosis not present

## 2019-06-16 DIAGNOSIS — I342 Nonrheumatic mitral (valve) stenosis: Secondary | ICD-10-CM | POA: Diagnosis not present

## 2019-07-06 DEATH — deceased

## 2020-04-24 IMAGING — CT CT ANGIOGRAPHY CHEST
2 of 7 series · 18 of 46 positions shown · IV contrast (APPLIED)
Comparison: 08/22/2017

CLINICAL DATA: Ongoing shortness of breath. Recent left arm
surgery.

EXAM:
CT ANGIOGRAPHY CHEST WITH CONTRAST
TECHNIQUE: Multidetector CT imaging of the chest was performed using the
standard protocol during bolus administration of intravenous
contrast. Multiplanar CT image reconstructions and MIPs were
obtained to evaluate the vascular anatomy.
CONTRAST:  80mL OMNIPAQUE IOHEXOL 350 MG/ML SOLN

[Series 7: thins · axial · 0.68mm/px · z∈[+1184,+1464]mm · 15 of 451 slices shown]
[im 26/451  lung]
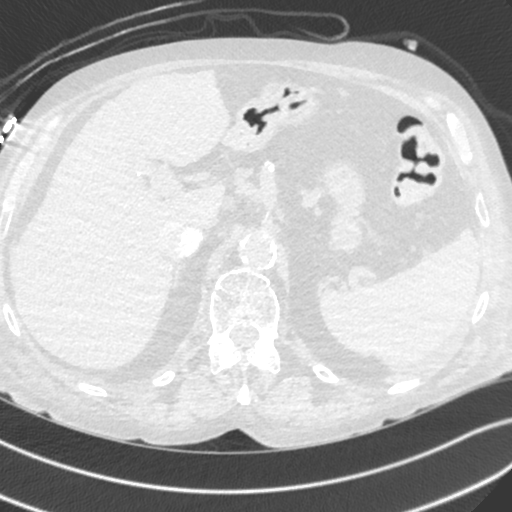
[im 51/451  soft-tissue]
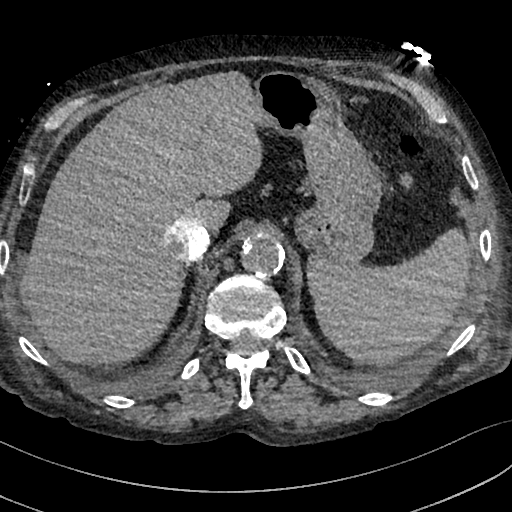
[im 76/451  lung]
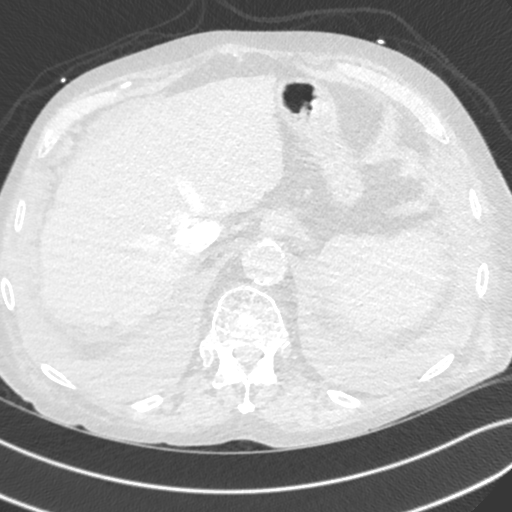
[im 101/451  soft-tissue]
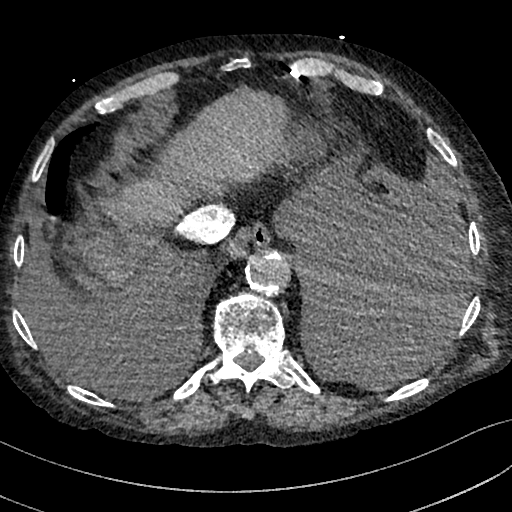
[im 151/451  lung]
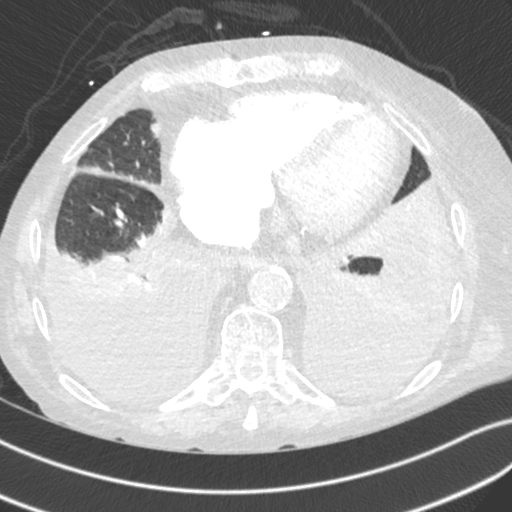
[im 176/451  soft-tissue]
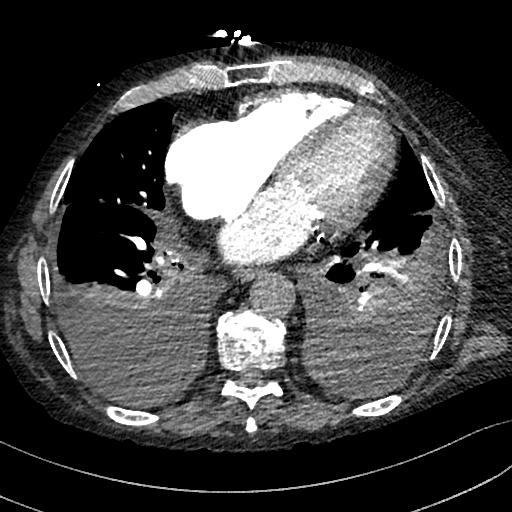
[im 201/451  lung]
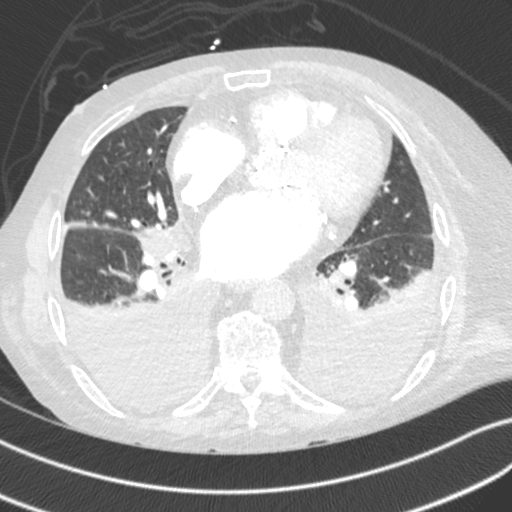
[im 226/451  soft-tissue]
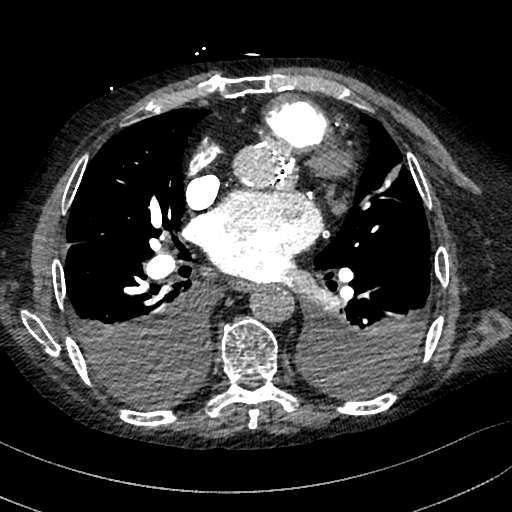
[im 251/451  lung]
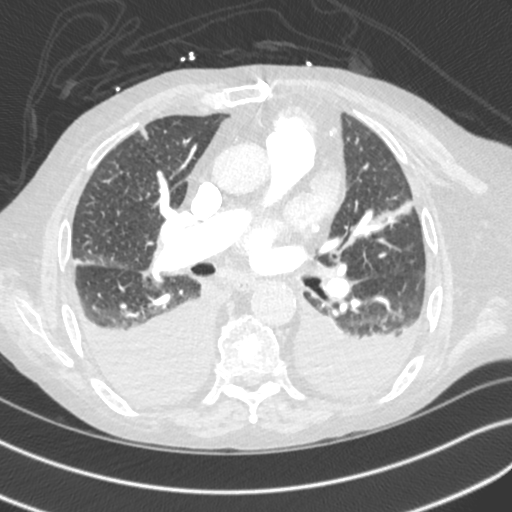
[im 276/451  soft-tissue]
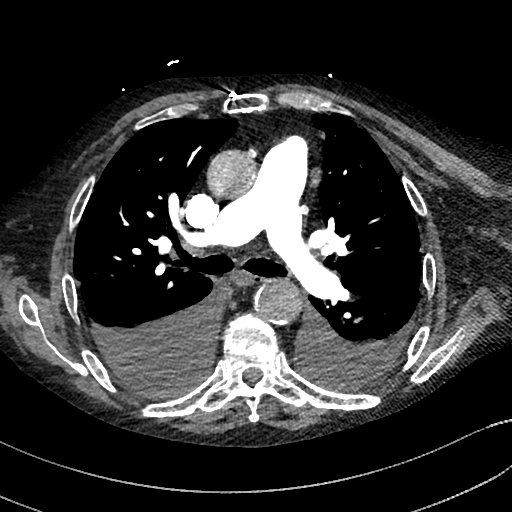
[im 301/451  lung]
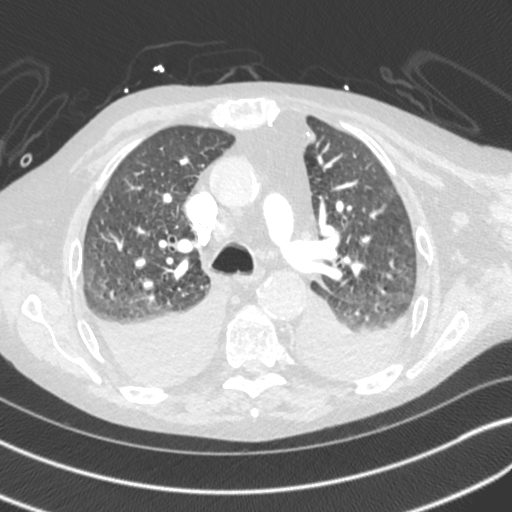
[im 351/451  soft-tissue]
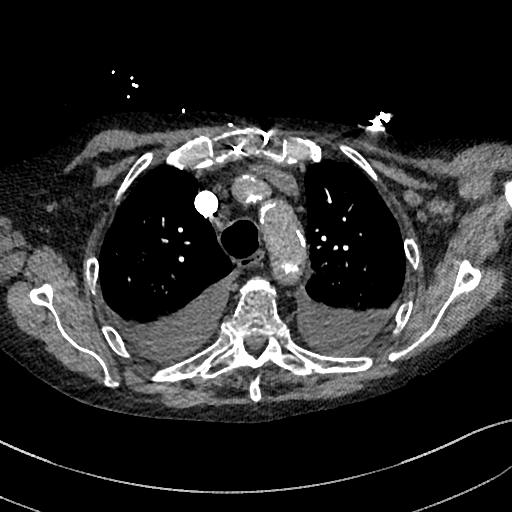
[im 376/451  lung]
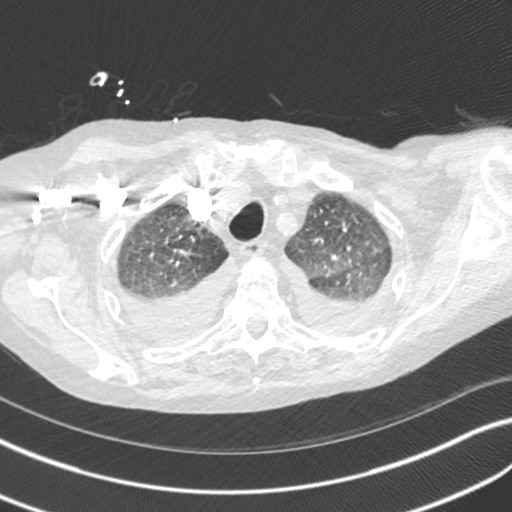
[im 401/451  soft-tissue]
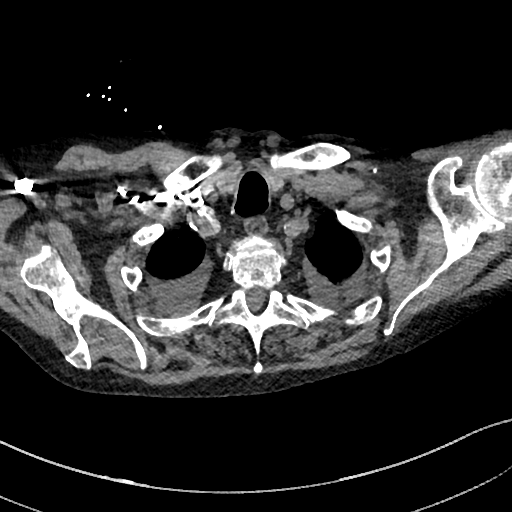
[im 426/451  lung]
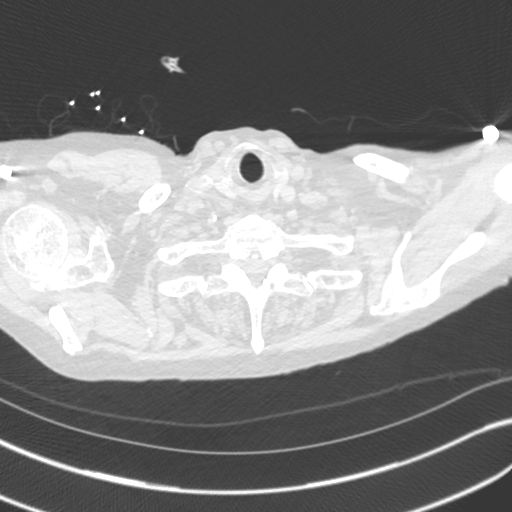

[Series 8: cor · coronal · 0.65mm/px · 3 of 132 slices shown]
[im 33/132  soft-tissue]
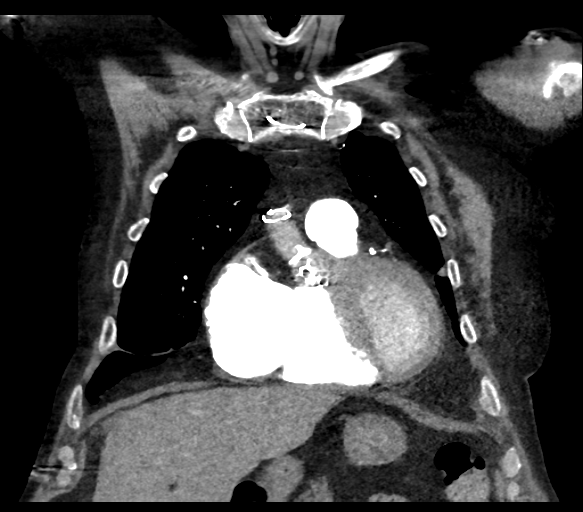
[im 66/132  soft-tissue]
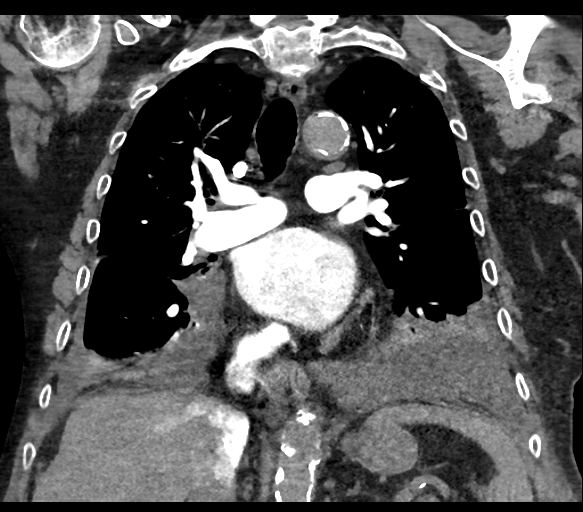
[im 99/132  soft-tissue]
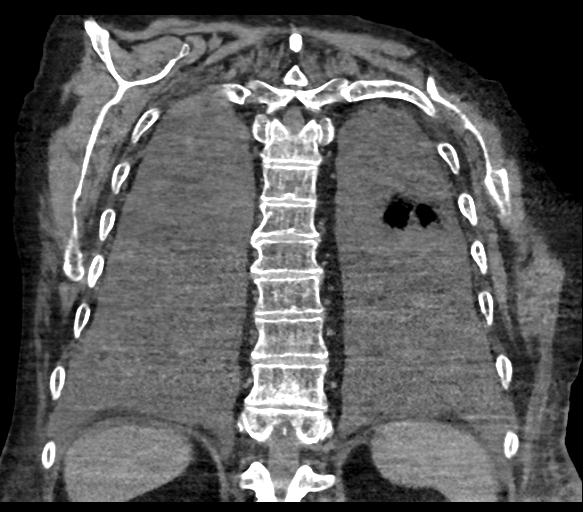

[18 of 46 positions shown; findings below may reference images not displayed]

FINDINGS: Cardiovascular: Moderate stable cardiomegaly. Calcification of the
mitral valve annulus. Interval TAVR. Calcification over the left
main and 3 vessel coronary arteries. Calcified plaque over the
thoracic aorta. Pulmonary arterial system is well opacified. There
is subtle peripheral linear filling defect over a distal right lower
lobar pulmonary artery which may be sequelae from chronic thrombus
although small acute thrombus is possible.

Mediastinum/Nodes: No significant mediastinal or hilar adenopathy.
Few shoddy mediastinal lymph nodes are present. Remaining
mediastinal structures are unremarkable.

Lungs/Pleura: Lungs are adequately inflated demonstrate moderate
size bilateral pleural effusions with associated compressive
atelectasis in the lung bases. Linear atelectasis over the lingula.
Airways are unremarkable.

Upper Abdomen: Calcified plaque over the abdominal aorta. Tiny
amount of perihepatic fluid is present.

Musculoskeletal: Degenerative change of the spine. Stable mild T12
compression fracture.

Review of the MIP images confirms the above findings.
IMPRESSION: Tiny peripheral linear filling defect over a distal right lower
lobar pulmonary artery which may be due to acute thrombus versus
sequelae from chronic thrombus.

Moderate size bilateral pleural effusions with associated bibasilar
compressive atelectasis. Linear atelectasis over the lingula.

Cardiomegaly. Atherosclerotic coronary artery disease. Evidence of
TAVR.

Aortic Atherosclerosis ([id number]).

Stable T12 compression fracture.

These results will be called to the ordering clinician or
representative by the Radiologist Assistant, and communication
documented in the PACS or zVision Dashboard.
